# Patient Record
Sex: Male | Born: 1955 | Race: Black or African American | Hispanic: No | Marital: Single | Smoking: Current every day smoker
Health system: Southern US, Community
[De-identification: ages and names within clinical notes are randomized; demographics above are authoritative.]

## PROBLEM LIST (undated history)

## (undated) DIAGNOSIS — G709 Myoneural disorder, unspecified: Secondary | ICD-10-CM

## (undated) DIAGNOSIS — K219 Gastro-esophageal reflux disease without esophagitis: Secondary | ICD-10-CM

## (undated) DIAGNOSIS — E119 Type 2 diabetes mellitus without complications: Secondary | ICD-10-CM

## (undated) DIAGNOSIS — T7840XA Allergy, unspecified, initial encounter: Secondary | ICD-10-CM

## (undated) DIAGNOSIS — J189 Pneumonia, unspecified organism: Secondary | ICD-10-CM

## (undated) DIAGNOSIS — H409 Unspecified glaucoma: Secondary | ICD-10-CM

## (undated) DIAGNOSIS — C801 Malignant (primary) neoplasm, unspecified: Secondary | ICD-10-CM

## (undated) DIAGNOSIS — N393 Stress incontinence (female) (male): Secondary | ICD-10-CM

## (undated) DIAGNOSIS — E785 Hyperlipidemia, unspecified: Secondary | ICD-10-CM

## (undated) DIAGNOSIS — I509 Heart failure, unspecified: Secondary | ICD-10-CM

## (undated) DIAGNOSIS — I10 Essential (primary) hypertension: Secondary | ICD-10-CM

## (undated) DIAGNOSIS — U071 COVID-19: Secondary | ICD-10-CM

## (undated) DIAGNOSIS — F419 Anxiety disorder, unspecified: Secondary | ICD-10-CM

## (undated) DIAGNOSIS — H269 Unspecified cataract: Secondary | ICD-10-CM

## (undated) DIAGNOSIS — D649 Anemia, unspecified: Secondary | ICD-10-CM

## (undated) HISTORY — DX: Allergy, unspecified, initial encounter: T78.40XA

## (undated) HISTORY — DX: Hyperlipidemia, unspecified: E78.5

## (undated) HISTORY — PX: CATARACT EXTRACTION: SUR2

## (undated) HISTORY — PX: COLONOSCOPY: SHX174

## (undated) HISTORY — DX: Unspecified glaucoma: H40.9

## (undated) HISTORY — DX: Anemia, unspecified: D64.9

## (undated) HISTORY — DX: Unspecified cataract: H26.9

## (undated) HISTORY — DX: Pneumonia, unspecified organism: J18.9

## (undated) HISTORY — DX: Malignant (primary) neoplasm, unspecified: C80.1

## (undated) HISTORY — DX: Myoneural disorder, unspecified: G70.9

## (undated) HISTORY — DX: Type 2 diabetes mellitus without complications: E11.9

## (undated) HISTORY — DX: Essential (primary) hypertension: I10

## (undated) HISTORY — PX: PROSTATE SURGERY: SHX751

## (undated) HISTORY — PX: PROSTATE BIOPSY: SHX241

---

## 2003-05-17 HISTORY — PX: KNEE ARTHROSCOPY: SHX127

## 2003-07-18 ENCOUNTER — Ambulatory Visit (HOSPITAL_COMMUNITY): Admission: RE | Admit: 2003-07-18 | Discharge: 2003-07-18 | Payer: Self-pay | Admitting: Orthopedic Surgery

## 2004-04-06 ENCOUNTER — Ambulatory Visit: Payer: Self-pay | Admitting: Gastroenterology

## 2004-04-16 ENCOUNTER — Ambulatory Visit: Payer: Self-pay | Admitting: Gastroenterology

## 2009-03-21 ENCOUNTER — Emergency Department (HOSPITAL_COMMUNITY): Admission: EM | Admit: 2009-03-21 | Discharge: 2009-03-21 | Payer: Self-pay | Admitting: Emergency Medicine

## 2010-03-16 HISTORY — PX: ROBOT ASSISTED LAPAROSCOPIC RADICAL PROSTATECTOMY: SHX5141

## 2010-05-11 ENCOUNTER — Emergency Department (HOSPITAL_COMMUNITY)
Admission: EM | Admit: 2010-05-11 | Discharge: 2010-05-12 | Payer: Self-pay | Source: Home / Self Care | Admitting: Emergency Medicine

## 2010-08-18 LAB — CBC
HCT: 42.1 % (ref 39.0–52.0)
MCHC: 34.9 g/dL (ref 30.0–36.0)
MCV: 97.6 fL (ref 78.0–100.0)
Platelets: 279 10*3/uL (ref 150–400)
RDW: 12.7 % (ref 11.5–15.5)
WBC: 7.4 10*3/uL (ref 4.0–10.5)

## 2010-08-18 LAB — DIFFERENTIAL
Basophils Absolute: 0 10*3/uL (ref 0.0–0.1)
Basophils Relative: 1 % (ref 0–1)
Eosinophils Absolute: 0.2 10*3/uL (ref 0.0–0.7)
Eosinophils Relative: 2 % (ref 0–5)
Neutrophils Relative %: 69 % (ref 43–77)

## 2010-08-18 LAB — BASIC METABOLIC PANEL
BUN: 11 mg/dL (ref 6–23)
CO2: 27 mEq/L (ref 19–32)
Chloride: 104 mEq/L (ref 96–112)
Creatinine, Ser: 1.11 mg/dL (ref 0.4–1.5)
Glucose, Bld: 101 mg/dL — ABNORMAL HIGH (ref 70–99)

## 2010-08-18 LAB — POCT CARDIAC MARKERS
CKMB, poc: 1.9 ng/mL (ref 1.0–8.0)
Troponin i, poc: <0.05 ng/mL (ref 0.00–0.09)

## 2010-08-18 LAB — D-DIMER, QUANTITATIVE: D-Dimer, Quant: 0.26 ug/mL-FEU (ref 0.00–0.48)

## 2011-02-14 ENCOUNTER — Emergency Department (HOSPITAL_COMMUNITY)
Admission: EM | Admit: 2011-02-14 | Discharge: 2011-02-14 | Disposition: A | Discharge status: Home / Self Care | Payer: BC Managed Care – PPO | Attending: Emergency Medicine | Admitting: Emergency Medicine

## 2011-02-14 ENCOUNTER — Emergency Department (HOSPITAL_COMMUNITY): Payer: BC Managed Care – PPO

## 2011-02-14 DIAGNOSIS — I10 Essential (primary) hypertension: Secondary | ICD-10-CM | POA: Insufficient documentation

## 2011-02-14 DIAGNOSIS — R0602 Shortness of breath: Secondary | ICD-10-CM | POA: Insufficient documentation

## 2011-02-14 DIAGNOSIS — J40 Bronchitis, not specified as acute or chronic: Secondary | ICD-10-CM | POA: Insufficient documentation

## 2011-02-14 DIAGNOSIS — R05 Cough: Secondary | ICD-10-CM | POA: Insufficient documentation

## 2011-02-14 DIAGNOSIS — R062 Wheezing: Secondary | ICD-10-CM | POA: Insufficient documentation

## 2011-02-14 DIAGNOSIS — F17200 Nicotine dependence, unspecified, uncomplicated: Secondary | ICD-10-CM | POA: Insufficient documentation

## 2011-02-14 DIAGNOSIS — R059 Cough, unspecified: Secondary | ICD-10-CM | POA: Insufficient documentation

## 2011-05-03 ENCOUNTER — Encounter: Payer: Self-pay | Admitting: Gastroenterology

## 2011-05-04 ENCOUNTER — Encounter: Payer: Self-pay | Admitting: Gastroenterology

## 2011-05-26 ENCOUNTER — Encounter: Payer: Self-pay | Admitting: Gastroenterology

## 2011-06-07 ENCOUNTER — Encounter: Payer: Self-pay | Admitting: Gastroenterology

## 2011-06-07 ENCOUNTER — Ambulatory Visit (AMBULATORY_SURGERY_CENTER): Payer: BC Managed Care – PPO | Admitting: *Deleted

## 2011-06-07 VITALS — Ht 69.0 in | Wt 209.0 lb

## 2011-06-07 DIAGNOSIS — Z1211 Encounter for screening for malignant neoplasm of colon: Secondary | ICD-10-CM

## 2011-06-07 MED ORDER — PEG-KCL-NACL-NASULF-NA ASC-C 100 G PO SOLR: ORAL | Status: DC

## 2011-06-20 ENCOUNTER — Encounter: Payer: Self-pay | Admitting: Gastroenterology

## 2011-06-20 ENCOUNTER — Ambulatory Visit (AMBULATORY_SURGERY_CENTER): Payer: BC Managed Care – PPO | Admitting: Gastroenterology

## 2011-06-20 VITALS — BP 136/76 | HR 75 | Temp 97.5°F | Resp 17 | Ht 69.0 in | Wt 209.0 lb

## 2011-06-20 DIAGNOSIS — Z800 Family history of malignant neoplasm of digestive organs: Secondary | ICD-10-CM

## 2011-06-20 DIAGNOSIS — Z1211 Encounter for screening for malignant neoplasm of colon: Secondary | ICD-10-CM

## 2011-06-20 DIAGNOSIS — D126 Benign neoplasm of colon, unspecified: Secondary | ICD-10-CM

## 2011-06-20 MED ORDER — SODIUM CHLORIDE 0.9 % IV SOLN: 500.0000 mL | INTRAVENOUS | Status: DC

## 2011-06-20 NOTE — Progress Notes (Signed)
Patient did not experience any of the following events: a burn prior to discharge; a fall within the facility; wrong site/side/patient/procedure/implant event; or a hospital transfer or hospital admission upon discharge from the facility. (G8907) Patient did not have preoperative order for IV antibiotic SSI prophylaxis. (G8918)  

## 2011-06-20 NOTE — Op Note (Signed)
[facility] [address] [place], [place]  [address]  COLONOSCOPY PROCEDURE REPORT  PATIENT:  Joseph, Harvey  MR#:  [id number] BIRTHDATE:  May 15, 1956, 55 yrs. old  GENDER:  male ENDOSCOPIST:  Barbette Hair. Arlyce Dice, MD REF. BY: PROCEDURE DATE:  06/20/2011 PROCEDURE:  Colonoscopy with snare polypectomy, Colon with cold biopsy polypectomy ASA CLASS:  Class II INDICATIONS:  Elevated Risk Screening, family history of colon cancer Parent MEDICATIONS:   MAC sedation, administered by CRNA propofol 430mg IV  DESCRIPTION OF PROCEDURE:   After the risks benefits and alternatives of the procedure were thoroughly explained, informed consent was obtained.  Digital rectal exam was performed and revealed no abnormalities.   The [id number] endoscope was introduced through the anus and advanced to the cecum, which was identified by both the appendix and ileocecal valve, without limitations.  The quality of the prep was excellent, using MoviPrep.  The instrument was then slowly withdrawn as the colon was fully examined. <<PROCEDUREIMAGES>>  FINDINGS:  There were multiple polyps identified and removed. in the descending colon (see image5 and image6). 2 1-38mm flat polyps - removed with cold bx forceps, single 3mm polyp removed with cold polypectomy snare - submitted to pathology  A diminutive polyp was found in the sigmoid colon. It was 2 mm in size. It was found 16 cm from the point of entry. The polyp was removed using cold biopsy forceps.  This was otherwise a normal examination of the colon (see image3 and image7).   Retroflexed views in the rectum revealed no abnormalities.    The time to cecum =  1) 2.0 minutes. The scope was then withdrawn in  1) 15.75  minutes from the cecum and the procedure completed. COMPLICATIONS:  None ENDOSCOPIC IMPRESSION: 1) Polyps, multiple in the descending colon 2) 2 mm diminutive polyp in the sigmoid colon 3) Otherwise normal  examination RECOMMENDATIONS: 1) Given your significant family history of colon cancer, you should have a repeat colonoscopy in 5 years REPEAT EXAM:  In 5 year(s) for Colonoscopy.  ______________________________ Barbette Hair. Arlyce Dice, MD  CC:  Ginger Carne MD  n. Rosalie DoctorBarbette Hair. Devan Danzer at 06/20/2011 10:13 AM  [name], [phone]

## 2011-06-20 NOTE — Patient Instructions (Signed)
FOLLOW DISCHARGE INSTRUCTIONS (BLUE AND GREEN SHEETS).. 

## 2011-06-21 ENCOUNTER — Telehealth: Payer: Self-pay

## 2011-06-21 ENCOUNTER — Telehealth: Payer: Self-pay | Admitting: *Deleted

## 2011-06-21 NOTE — Telephone Encounter (Signed)
Pt was already called back by another recovery room nurse. Pt states that he is doing well.

## 2011-06-21 NOTE — Telephone Encounter (Signed)
  Follow up Call-  Call back number 06/20/2011  Post procedure Call Back phone  # [phone]  Permission to leave phone message Yes     Patient questions:  Do you have a fever, pain , or abdominal swelling? no Pain Score  0 *  Have you tolerated food without any problems? yes  Have you been able to return to your normal activities? yes  Do you have any questions about your discharge instructions: Diet   no Medications  no Follow up visit  no  Do you have questions or concerns about your Care? no  Actions: * If pain score is 4 or above: No action needed, pain <4.  The pt asked if he could come and get a work note for yesterday.  I advised him yes to come to the third floor desk off the elevator. Maw

## 2011-06-24 ENCOUNTER — Encounter: Payer: Self-pay | Admitting: Gastroenterology

## 2012-05-30 ENCOUNTER — Ambulatory Visit (INDEPENDENT_AMBULATORY_CARE_PROVIDER_SITE_OTHER)
Admission: RE | Admit: 2012-05-30 | Discharge: 2012-05-30 | Disposition: A | Discharge status: Home / Self Care | Payer: BC Managed Care – PPO | Source: Ambulatory Visit | Attending: Internal Medicine | Admitting: Internal Medicine

## 2012-05-30 ENCOUNTER — Ambulatory Visit (INDEPENDENT_AMBULATORY_CARE_PROVIDER_SITE_OTHER): Payer: BC Managed Care – PPO | Admitting: Internal Medicine

## 2012-05-30 ENCOUNTER — Encounter: Payer: Self-pay | Admitting: Internal Medicine

## 2012-05-30 ENCOUNTER — Encounter: Payer: Self-pay | Admitting: *Deleted

## 2012-05-30 VITALS — BP 134/86 | HR 82 | Temp 98.1°F | Ht 69.0 in | Wt 224.0 lb

## 2012-05-30 DIAGNOSIS — J45909 Unspecified asthma, uncomplicated: Secondary | ICD-10-CM

## 2012-05-30 DIAGNOSIS — J4550 Severe persistent asthma, uncomplicated: Secondary | ICD-10-CM | POA: Insufficient documentation

## 2012-05-30 DIAGNOSIS — I10 Essential (primary) hypertension: Secondary | ICD-10-CM

## 2012-05-30 MED ORDER — OLMESARTAN MEDOXOMIL 20 MG PO TABS: 20.0000 mg | ORAL_TABLET | Freq: Every day | ORAL | Status: DC

## 2012-05-30 NOTE — Progress Notes (Signed)
  Subjective:    Patient ID: Joseph Harvey, male    DOB: [birth year] MRN: [id number]  HPI  26 yobm smoker prev eval by [facility] [place] around 2010 for "allergies" = chronic cough and sob Pos dust, mold rec avoidance and took shots x 6months no help so stopped and on multiple inhalers > not helping so referred himself to pulmonary clinic 05/30/2012 for eval of sob.   05/30/2012 1st pulmonary eval on ACEI on multiple inhalers cc sob in spells x 3-4 years, nothing really helps x steroid shots, happens a week at a time and happened last around Christmas 2013 staying on singulair and dulera not really better with proaire/neb.  Assoc with non-productive cough and sense of throat congestion and pnds   Sleeping ok without nocturnal  or early am exacerbation  of respiratory  c/o's or need for noct saba. Also denies any obvious fluctuation of symptoms with weather or environmental changes or other aggravating or alleviating factors except as outlined above   Review of Systems  Constitutional: Negative for fever, chills, activity change, appetite change and unexpected weight change.  HENT: Positive for congestion and sore throat. Negative for rhinorrhea, sneezing, trouble swallowing, dental problem, voice change and postnasal drip.   Eyes: Negative for visual disturbance.  Respiratory: Positive for cough and shortness of breath. Negative for choking.   Cardiovascular: Negative for chest pain and leg swelling.  Gastrointestinal: Negative for nausea, vomiting and abdominal pain.  Genitourinary: Negative for difficulty urinating.  Musculoskeletal: Negative for arthralgias.  Skin: Negative for rash.  Psychiatric/Behavioral: Negative for behavioral problems and confusion.       Objective:   Physical Exam Pleasant amb wm nad prominent pseudowheeze Wt Readings from Last 3 Encounters:  05/30/12 224 lb (101.606 kg)  06/20/11 209 lb (94.802 kg)  06/07/11 209 lb (94.802 kg)    HEENT: nl dentition, turbinates,  and orophanx. Nl external ear canals without cough reflex   NECK :  without JVD/Nodes/TM/ nl carotid upstrokes bilaterally   LUNGS: no acc muscle use, clear to A and P bilaterally without cough on insp or exp maneuvers   CV:  RRR  no s3 or murmur or increase in P2, no edema   ABD:  soft and nontender with nl excursion in the supine position. No bruits or organomegaly, bowel sounds nl  MS:  warm without deformities, calf tenderness, cyanosis or clubbing  SKIN: warm and dry without lesions    NEURO:  alert, approp, no deficits    CXR  05/30/2012 :  Bronchitic changes.         Assessment & Plan:

## 2012-05-30 NOTE — Patient Instructions (Addendum)
Stop lisinopril Start benicar 20 mg one daily  Continue dulera and singulair for now Only use your albuterol as a rescue medication to be used if you can't catch your breath by resting or doing a relaxed purse lip breathing pattern. The less you use it, the better it will work when you need it.   Please remember to go to  x-ray department downstairs for your tests - we will call you with the results when they are available.     Please schedule a follow up office visit in 4 weeks, sooner if needed with pfts

## 2012-05-31 NOTE — Progress Notes (Signed)
Quick Note:  Spoke with pt and notified of results per Dr. Wert. Pt verbalized understanding and denied any questions.  ______ 

## 2012-06-01 DIAGNOSIS — I10 Essential (primary) hypertension: Secondary | ICD-10-CM | POA: Insufficient documentation

## 2012-06-01 NOTE — Assessment & Plan Note (Signed)

## 2012-06-01 NOTE — Assessment & Plan Note (Addendum)
Not clear this is asthma at all >  Classic Upper airway cough syndrome, so named because it's frequently impossible to sort out how much is  CR/sinusitis with freq throat clearing (which can be related to primary GERD)   vs  causing  secondary (" extra esophageal")  GERD from wide swings in gastric pressure that occur with throat clearing, often  promoting self use of mint and menthol lozenges that reduce the lower esophageal sphincter tone and exacerbate the problem further in a cyclical fashion.   These are the same pts (now being labeled as having "irritable larynx syndrome" by some cough centers) who not infrequently have a history of having failed to tolerate ace inhibitors,  dry powder inhalers or biphosphonates or report having atypical reflux symptoms that don't respond to standard doses of PPI , and are easily confused as having aecopd or asthma flares by even experienced allergists/ pulmonologists.   Will try off acei and regroup with pft's and modify his asthma and allergy medication once no longer fanning the fire of upper airway inflammation with ACEI related bradykinins

## 2012-06-29 ENCOUNTER — Ambulatory Visit: Payer: BC Managed Care – PPO | Admitting: Internal Medicine

## 2012-06-30 ENCOUNTER — Other Ambulatory Visit: Payer: Self-pay

## 2012-07-02 ENCOUNTER — Telehealth: Payer: Self-pay | Admitting: Internal Medicine

## 2012-07-02 MED ORDER — OLMESARTAN MEDOXOMIL 20 MG PO TABS: 20.0000 mg | ORAL_TABLET | Freq: Every day | ORAL | Status: DC

## 2012-07-02 NOTE — Telephone Encounter (Signed)
Refill sent. Pt is aware. Brailey Buescher, CMA  

## 2012-07-25 ENCOUNTER — Encounter: Payer: Self-pay | Admitting: *Deleted

## 2012-07-25 ENCOUNTER — Ambulatory Visit (INDEPENDENT_AMBULATORY_CARE_PROVIDER_SITE_OTHER): Payer: BC Managed Care – PPO | Admitting: Internal Medicine

## 2012-07-25 ENCOUNTER — Encounter: Payer: Self-pay | Admitting: Allergy

## 2012-07-25 ENCOUNTER — Encounter: Payer: Self-pay | Admitting: Internal Medicine

## 2012-07-25 ENCOUNTER — Ambulatory Visit (INDEPENDENT_AMBULATORY_CARE_PROVIDER_SITE_OTHER)
Admission: RE | Admit: 2012-07-25 | Discharge: 2012-07-25 | Disposition: A | Discharge status: Home / Self Care | Payer: BC Managed Care – PPO | Source: Ambulatory Visit | Attending: Internal Medicine | Admitting: Internal Medicine

## 2012-07-25 VITALS — BP 130/72 | HR 94 | Temp 98.3°F | Ht 68.0 in | Wt 224.0 lb

## 2012-07-25 DIAGNOSIS — J45909 Unspecified asthma, uncomplicated: Secondary | ICD-10-CM

## 2012-07-25 DIAGNOSIS — R059 Cough, unspecified: Secondary | ICD-10-CM

## 2012-07-25 DIAGNOSIS — R05 Cough: Secondary | ICD-10-CM

## 2012-07-25 LAB — PULMONARY FUNCTION TEST

## 2012-07-25 MED ORDER — MOMETASONE FURO-FORMOTEROL FUM 100-5 MCG/ACT IN AERO: INHALATION_SPRAY | RESPIRATORY_TRACT | Status: DC

## 2012-07-25 NOTE — Progress Notes (Signed)
  Subjective:    Patient ID: Joseph Harvey, male    DOB: 12-12-1955 MRN: [id number]  HPI  42 yobm smoker prev eval by [facility] [place] around 2010 for "allergies" = chronic cough and sob Pos dust, mold rec avoidance and took shots x 6months no help so stopped and on multiple inhalers > not helping so referred himself to pulmonary clinic 05/30/2012 for eval of sob.   05/30/2012 1st pulmonary eval on ACEI on multiple inhalers cc sob in spells x 3-4 years, nothing really helps x steroid shots, happens a week at a time and happened last around Christmas 2013 staying on singulair and dulera not really better with proaire/neb.  Assoc with non-productive cough and sense of throat congestion and pnds  rec Stop lisinopril Start benicar 20 mg one daily  Continue dulera and singulair for now   07/25/2012 f/u ov/Wert cc pnds worse when lie > white mucus no sob, using neb once per month  And better p dulera 200  No obvious daytime variabilty or assoc chronic cough or cp or chest tightness, subjective wheeze overt sinus or hb symptoms. No unusual exp hx or h/o childhood pna/ asthma or premature birth to his knowledge.    Sleeping ok with occ  early am exacerbation  of respiratory  c/o's or need for noct saba. Also denies any obvious fluctuation of symptoms with weather or environmental changes or other aggravating or alleviating factors except as outlined above         Objective:   Physical Exam Pleasant amb wm with freq throat clear 07/25/2012  Wt  224  Wt Readings from Last 3 Encounters:  05/30/12 224 lb (101.606 kg)  06/20/11 209 lb (94.802 kg)  06/07/11 209 lb (94.802 kg)    HEENT: nl dentition, turbinates, and orophanx. Nl external ear canals without cough reflex   NECK :  without JVD/Nodes/TM/ nl carotid upstrokes bilaterally   LUNGS: no acc muscle use, clear to A and P bilaterally without cough on insp or exp maneuvers   CV:  RRR  no s3 or murmur or increase in P2, no edema   ABD:  soft  and nontender with nl excursion in the supine position. No bruits or organomegaly, bowel sounds nl  MS:  warm without deformities, calf tenderness, cyanosis or clubbing  SKIN: warm and dry without lesions        CXR  05/30/2012 :  Bronchitic changes.         Assessment & Plan:

## 2012-07-25 NOTE — Patient Instructions (Addendum)
Please see patient coordinator before you leave today  to schedule sinus ct   Decrease to dulera 100 Take 2 puffs first thing in am and then another 2 puffs about 12 hours later.   Work on inhaler technique:  relax and gently blow all the way out then take a nice smooth deep breath back in, triggering the inhaler at same time you start breathing in.  Hold for up to 5 seconds if you can.  Rinse and gargle with water when done   If your mouth or throat starts to bother you,   I suggest you time the inhaler to your dental care and after using the inhaler(s) brush teeth and tongue with a baking soda containing toothpaste and when you rinse this out, gargle with it first to see if this helps your mouth and throat.     mucinex dm 1200 mg every 12 hours as needed for cough  Pepcid ac 20 mg one at bedtime   The key is to stop smoking completely before smoking completely stops you!   Please schedule a follow up office visit in 4 weeks, sooner if needed

## 2012-07-25 NOTE — Progress Notes (Signed)
PFT done today. 

## 2012-07-27 NOTE — Progress Notes (Signed)
Quick Note:  Spoke with pt and notified of results per Dr. Wert. Pt verbalized understanding and denied any questions.  ______ 

## 2012-07-28 NOTE — Assessment & Plan Note (Signed)
-   Sinus CT  07/25/2012  > No evidence for acute or chronic sinus disease.  Strongly suspect  Classic Upper airway cough syndrome, so named because it's frequently impossible to sort out how much is  CR/sinusitis with freq throat clearing (which can be related to primary GERD)   vs  causing  secondary (" extra esophageal")  GERD from wide swings in gastric pressure that occur with throat clearing, often  promoting self use of mint and menthol lozenges that reduce the lower esophageal sphincter tone and exacerbate the problem further in a cyclical fashion.   These are the same pts (now being labeled as having "irritable larynx syndrome" by some cough centers) who not infrequently have a history of having failed to tolerate ace inhibitors,  dry powder inhalers or biphosphonates or report having atypical reflux symptoms that don't respond to standard doses of PPI , and are easily confused as having aecopd or asthma flares by even experienced allergists/ pulmonologists.   In absence of active sinus dz most likely this is either gerd or a primary irritable larynx syndrome but may be exac by ics or assoc with cough variant asthma - tough to know what to do so will use the lower strenght  of dulera and treat  Gerd/ cyclical cough aggressively  See instructions for specific recommendations which were reviewed directly with the patient who was given a copy with highlighter outlining the key components.

## 2012-07-28 NOTE — Assessment & Plan Note (Addendum)
-   stop acei 05/30/2012  - PFT's 07/25/2012  FEV1 2.08 (63%) 77 ratio and no change p B2 ,  DLCO 97  All goals of chronic asthma control met including optimal function and elimination of symptoms with minimal need for rescue therapy.  Contingencies discussed in full including contacting this office immediately if not controlling the symptoms using the rule of two's.       Each maintenance medication was reviewed in detail including most importantly the difference between maintenance and as needed and under what circumstances the prns are to be used.  Please see instructions for details which were reviewed in writing and the patient given a copy.    The proper method of use, as well as anticipated side effects, of a metered-dose inhaler are discussed and demonstrated to the patient. Improved effectiveness after extensive coaching during this visit to a level of approximately  75%

## 2012-08-10 ENCOUNTER — Encounter: Payer: Self-pay | Admitting: Internal Medicine

## 2012-08-29 ENCOUNTER — Ambulatory Visit (INDEPENDENT_AMBULATORY_CARE_PROVIDER_SITE_OTHER): Payer: BC Managed Care – PPO | Admitting: Internal Medicine

## 2012-08-29 ENCOUNTER — Encounter: Payer: Self-pay | Admitting: Internal Medicine

## 2012-08-29 ENCOUNTER — Encounter: Payer: Self-pay | Admitting: *Deleted

## 2012-08-29 VITALS — BP 120/72 | HR 80 | Temp 98.4°F | Ht 69.0 in | Wt 212.0 lb

## 2012-08-29 DIAGNOSIS — J45909 Unspecified asthma, uncomplicated: Secondary | ICD-10-CM

## 2012-08-29 DIAGNOSIS — F17200 Nicotine dependence, unspecified, uncomplicated: Secondary | ICD-10-CM

## 2012-08-29 DIAGNOSIS — I10 Essential (primary) hypertension: Secondary | ICD-10-CM

## 2012-08-29 MED ORDER — MOMETASONE FURO-FORMOTEROL FUM 100-5 MCG/ACT IN AERO: INHALATION_SPRAY | RESPIRATORY_TRACT | Status: DC

## 2012-08-29 NOTE — Patient Instructions (Addendum)
Decrease to dulera 100 Take 2 puffs first thing in am and then another 2 puffs about 12 hours later.   Work on inhaler technique:  relax and gently blow all the way out then take a nice smooth deep breath back in, triggering the inhaler at same time you start breathing in.  Hold for up to 5 seconds if you can.  Rinse and gargle with water when done   If your mouth or throat starts to bother you,   I suggest you time the inhaler to your dental care and after using the inhaler(s) brush teeth and tongue with a baking soda containing toothpaste and when you rinse this out, gargle with it first to see if this helps your mouth and throat.     mucinex dm 1200 mg every 12 hours as needed for cough  Pepcid ac 20 mg one at bedtime   The key is to stop smoking completely before smoking completely stops you!   Please schedule a follow up office visit in 2 months

## 2012-08-29 NOTE — Progress Notes (Signed)
Subjective:    Patient ID: Joseph Harvey, male    DOB: 1955-07-09 MRN: [id number]  HPI  83 yobm smoker prev eval by [facility] [place] around 2010 for "allergies" = chronic cough and sob Pos dust, mold rec avoidance and took shots x 6months no help so stopped and on multiple inhalers > not helping so referred himself to pulmonary clinic 05/30/2012 for eval of sob.   05/30/2012 1st pulmonary eval on ACEI on multiple inhalers cc sob in spells x 3-4 years, nothing really helps x steroid shots, happens a week at a time and happened last around Christmas 2013 staying on singulair and dulera not really better with proaire/neb.  Assoc with non-productive cough and sense of throat congestion and pnds  rec Stop lisinopril Start benicar 20 mg one daily  Continue dulera and singulair for now   07/25/2012 f/u ov/Joseph Harvey cc pnds worse when lie > white mucus no sob, using neb once per month  And better p dulera 200 rec Please see patient coordinator before you leave today  to schedule sinus ct > ok Decrease to dulera 100 Take 2 puffs first thing in am and then another 2 puffs about 12 hours later.  Work on inhaler technique:  mucinex dm 1200 mg every 12 hours as needed for cough Pepcid ac 20 mg one at bedtime  The key is to stop smoking completely before smoking completely stops you!    08/29/2012 f/u ov/Joseph Harvey re asthma Chief Complaint  Patient presents with  . Follow-up    Pt states has had two asthma flares since last visit- given neb txs and pred taper per PCP. Last flare was approx 2 wks ago. He states breathing back at normal baseline today   for the last week still feels urge to use nebulizer several times a week, esp in am, due to sense of chest congestion and tightness, esp in am better p neb and not using saba hfa first as rec with poor hfa technique on dulera 200 2bid   No obvious daytime variabilty or assoc chronic cough or cp or chest tightness, subjective wheeze overt sinus or hb symptoms. No  unusual exp hx or h/o childhood pna/ asthma or premature birth to his knowledge.    Sleeping ok with occ  early am exacerbation  of respiratory  c/o's or need for noct saba. Also denies any obvious fluctuation of symptoms with weather or environmental changes or other aggravating or alleviating factors except as outlined above         Objective:   Physical Exam Pleasant amb wm with less freq throat clear but mod hoarse 07/25/2012  Wt  224 >  212 08/29/12 Wt Readings from Last 3 Encounters:  05/30/12 224 lb (101.606 kg)  06/20/11 209 lb (94.802 kg)  06/07/11 209 lb (94.802 kg)    HEENT: nl dentition, turbinates, and orophanx. Nl external ear canals without cough reflex   NECK :  without JVD/Nodes/TM/ nl carotid upstrokes bilaterally   LUNGS: no acc muscle use, late exp wheeze bilaterally   CV:  RRR  no s3 or murmur or increase in P2, no edema   ABD:  soft and nontender with nl excursion in the supine position. No bruits or organomegaly, bowel sounds nl  MS:  warm without deformities, calf tenderness, cyanosis or clubbing  SKIN: warm and dry without lesions        CXR  05/30/2012 :  Bronchitic changes.         Assessment & Plan:

## 2012-08-29 NOTE — Assessment & Plan Note (Signed)
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

## 2012-08-29 NOTE — Assessment & Plan Note (Addendum)
-   stop acei 05/30/2012  - PFT's 07/25/2012  FEV1 2.08 (63%) 77 ratio and no change p B2 ,  DLCO 97 - hfa 90% 08/29/2012   The proper method of use, as well as anticipated side effects, of a metered-dose inhaler are discussed and demonstrated to the patient. Improved effectiveness after extensive coaching during this visit to a level of approximately  90%  from baseline < 50 while on the 200 2bid dose of dulera which may have been irritating the upper airway more than helping the lower.  Will therefore try better hfa and less potent ics, namely dulera 100 2bid and see if symptoms not better- if worsen then the asthma component which is probably fueled by cigarettes, is worth that it otherwise appears

## 2012-08-31 DIAGNOSIS — F17200 Nicotine dependence, unspecified, uncomplicated: Secondary | ICD-10-CM | POA: Insufficient documentation

## 2012-08-31 NOTE — Assessment & Plan Note (Signed)
I took an extended  opportunity with this patient to outline the consequences of continued cigarette use  in airway disorders based on all the data we have from the multiple national lung health studies (perfomed over decades at millions of dollars in cost)  indicating that smoking cessation, not choice of inhalers or physicians, is the most important aspect of care.   

## 2012-10-29 ENCOUNTER — Ambulatory Visit (INDEPENDENT_AMBULATORY_CARE_PROVIDER_SITE_OTHER): Payer: BC Managed Care – PPO | Admitting: Internal Medicine

## 2012-10-29 ENCOUNTER — Encounter: Payer: Self-pay | Admitting: Internal Medicine

## 2012-10-29 ENCOUNTER — Encounter: Payer: Self-pay | Admitting: *Deleted

## 2012-10-29 VITALS — BP 130/92 | HR 78 | Temp 98.2°F | Ht 69.0 in | Wt 216.0 lb

## 2012-10-29 DIAGNOSIS — F17200 Nicotine dependence, unspecified, uncomplicated: Secondary | ICD-10-CM

## 2012-10-29 DIAGNOSIS — I10 Essential (primary) hypertension: Secondary | ICD-10-CM

## 2012-10-29 DIAGNOSIS — J45909 Unspecified asthma, uncomplicated: Secondary | ICD-10-CM

## 2012-10-29 NOTE — Assessment & Plan Note (Signed)
Strongly encouraged to quit completely.

## 2012-10-29 NOTE — Progress Notes (Signed)
Subjective:    Patient ID: Joseph Harvey, male    DOB: [birth year] MRN: [id number]  HPI  33 yobm smoker prev eval by [facility] around 2010 for "allergies" = chronic cough and sob Pos dust, mold rec avoidance and took shots x 6months no help so stopped and on multiple inhalers > not helping so referred himself to pulmonary clinic 05/30/2012 for eval of sob.   05/30/2012 1st pulmonary eval on ACEI on multiple inhalers cc sob in spells x 3-4 years, nothing really helps x steroid shots, happens a week at a time and happened last around Christmas 2013 staying on singulair and dulera not really better with proaire/neb.  Assoc with non-productive cough and sense of throat congestion and pnds  rec Stop lisinopril Start benicar 20 mg one daily  Continue dulera and singulair for now   07/25/2012 f/u ov/Jossalyn Forgione cc pnds worse when lie > white mucus no sob, using neb once per month  And better p dulera 200 rec Please see patient coordinator before you leave today  to schedule sinus ct > ok Decrease to dulera 100 Take 2 puffs first thing in am and then another 2 puffs about 12 hours later.  Work on inhaler technique:  mucinex dm 1200 mg every 12 hours as needed for cough Pepcid ac 20 mg one at bedtime  The key is to stop smoking completely before smoking completely stops you!    08/29/2012 f/u ov/Cyprian Gongaware re asthma Chief Complaint  Patient presents with  . Follow-up    Pt states has had two asthma flares since last visit- given neb txs and pred taper per PCP. Last flare was approx 2 wks ago. He states breathing back at normal baseline today   for the last week still feels urge to use nebulizer several times a week, esp in am, due to sense of chest congestion and tightness, esp in am better p neb and not using saba hfa first as rec with poor hfa technique on dulera 200 2bid rec Decrease to dulera 100 Take 2 puffs first thing in am and then another 2 puffs about 12 hours later.  Work on inhaler technique:    mucinex dm 1200 mg every 12 hours as needed for cough Pepcid ac 20 mg one at bedtime  The key is to stop smoking completely before smoking completely stops you!    10/29/2012 f/u ov/Hadleigh Felber re asthma on dulera  100 2 bid/ still smoking some Chief Complaint  Patient presents with  . Asthma    Breathing has improved since changing BP med. Reports coughing from time to time. Denies chest pain,  chest tightness or SOB.  avg use of saba once a week, occ neb also.     No obvious daytime variabilty or assoc chronic cough or cp or chest tightness, subjective wheeze overt sinus or hb symptoms. No unusual exp hx or h/o childhood pna/ asthma or premature birth to his knowledge.    Sleeping ok with occ  early am exacerbation  of respiratory  c/o's or need for noct saba. Also denies any obvious fluctuation of symptoms with weather or environmental changes or other aggravating or alleviating factors except as outlined above    Current Medications, Allergies, Past Medical History, Past Surgical History, Family History, and Social History were reviewed in [employer] record.  ROS  The following are not active complaints unless bolded sore throat, dysphagia, dental problems, itching, sneezing,  nasal congestion or excess/ purulent secretions, ear ache,  fever, chills, sweats, unintended wt loss, pleuritic or exertional cp, hemoptysis,  orthopnea pnd or leg swelling, presyncope, palpitations, heartburn, abdominal pain, anorexia, nausea, vomiting, diarrhea  or change in bowel or urinary habits, change in stools or urine, dysuria,hematuria,  rash, arthralgias, visual complaints, headache, numbness weakness or ataxia or problems with walking or coordination,  change in mood/affect or memory.           Objective:   Physical Exam Pleasant amb wm with much  less freq throat and no longer hoarse 07/25/2012  Wt  224 >  212 08/29/12 > 10/29/2012  216  Wt Readings from Last 3 Encounters:   05/30/12 224 lb (101.606 kg)  06/20/11 209 lb (94.802 kg)  06/07/11 209 lb (94.802 kg)    HEENT: nl dentition, turbinates, and orophanx. Nl external ear canals without cough reflex   NECK :  without JVD/Nodes/TM/ nl carotid upstrokes bilaterally   LUNGS: no acc muscle use, late exp wheeze bilaterally   CV:  RRR  no s3 or murmur or increase in P2, no edema   ABD:  soft and nontender with nl excursion in the supine position. No bruits or organomegaly, bowel sounds nl  MS:  warm without deformities, calf tenderness, cyanosis or clubbing  SKIN: warm and dry without lesions        CXR  05/30/2012 :  Bronchitic changes.         Assessment & Plan:

## 2012-10-29 NOTE — Assessment & Plan Note (Signed)
-   stop acei 05/30/2012  - PFT's 07/25/2012  FEV1 2.08 (63%) 77 ratio and no change p B2 ,  DLCO 97 - hfa 90% 08/29/2012  > confirmed 10/29/2012   All goals of chronic asthma control met including optimal function and elimination of symptoms with minimal need for rescue therapy.  Contingencies discussed in full including contacting this office immediately if not controlling the symptoms using the rule of two's.

## 2012-10-29 NOTE — Patient Instructions (Addendum)
Try dymista one twice daily instead of astelin and if you like it we'll call it in  No other changes on your medications  Please schedule a follow up office visit in 6 months, sooner if needed

## 2012-10-29 NOTE — Assessment & Plan Note (Signed)
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

## 2012-10-31 ENCOUNTER — Telehealth: Payer: Self-pay | Admitting: Internal Medicine

## 2012-10-31 MED ORDER — AZELASTINE-FLUTICASONE 137-50 MCG/ACT NA SUSP: 2.0000 | Freq: Every day | NASAL | Status: DC

## 2012-10-31 NOTE — Telephone Encounter (Signed)
Pt aware RX SENT . Nothing further was needed

## 2012-12-10 ENCOUNTER — Telehealth: Payer: Self-pay | Admitting: Internal Medicine

## 2012-12-10 ENCOUNTER — Other Ambulatory Visit (INDEPENDENT_AMBULATORY_CARE_PROVIDER_SITE_OTHER): Payer: BC Managed Care – PPO

## 2012-12-10 ENCOUNTER — Encounter: Payer: Self-pay | Admitting: Internal Medicine

## 2012-12-10 ENCOUNTER — Ambulatory Visit (INDEPENDENT_AMBULATORY_CARE_PROVIDER_SITE_OTHER): Payer: BC Managed Care – PPO | Admitting: Internal Medicine

## 2012-12-10 ENCOUNTER — Encounter: Payer: Self-pay | Admitting: *Deleted

## 2012-12-10 VITALS — BP 124/82 | HR 93 | Temp 98.6°F | Ht 69.0 in | Wt 220.0 lb

## 2012-12-10 DIAGNOSIS — J45909 Unspecified asthma, uncomplicated: Secondary | ICD-10-CM

## 2012-12-10 DIAGNOSIS — F17200 Nicotine dependence, unspecified, uncomplicated: Secondary | ICD-10-CM

## 2012-12-10 LAB — ALLERGY PROFILE REGION II-DC, DE, MD, ~~LOC~~, VA
Allergen, D pternoyssinus,d7: <0.1 kU/L
Alternaria Alternata: <0.1 kU/L
Box Elder IgE: <0.1 kU/L
Cat Dander: <0.1 kU/L
Cockroach: <0.1 kU/L
D. farinae: <0.1 kU/L
Dog Dander: <0.1 kU/L
Oak: <0.1 kU/L
Pecan/Hickory Tree IgE: <0.1 kU/L

## 2012-12-10 LAB — CBC WITH DIFFERENTIAL/PLATELET
Basophils Relative: 0.8 % (ref 0.0–3.0)
Eosinophils Absolute: 0.2 10*3/uL (ref 0.0–0.7)
Eosinophils Relative: 3.8 % (ref 0.0–5.0)
HCT: 44.3 % (ref 39.0–52.0)
Lymphs Abs: 2.6 10*3/uL (ref 0.7–4.0)
MCHC: 34.1 g/dL (ref 30.0–36.0)
MCV: 96.2 fl (ref 78.0–100.0)
Monocytes Absolute: 0.6 10*3/uL (ref 0.1–1.0)
Platelets: 363 10*3/uL (ref 150.0–400.0)
WBC: 6.3 10*3/uL (ref 4.5–10.5)

## 2012-12-10 MED ORDER — FAMOTIDINE 20 MG PO TABS: ORAL_TABLET | ORAL | Status: DC

## 2012-12-10 MED ORDER — DEXLANSOPRAZOLE 60 MG PO CPDR: DELAYED_RELEASE_CAPSULE | ORAL | Status: DC

## 2012-12-10 MED ORDER — PREDNISONE (PAK) 10 MG PO TABS: ORAL_TABLET | ORAL | Status: DC

## 2012-12-10 NOTE — Telephone Encounter (Signed)
Per pt prednisone was suppose to be called in from today appt. I looked at AVS and does not mention this. Please advise MW thanks  Allergies  Allergen Reactions  . Robitussin (Guaifenesin) Shortness Of Breath    wheezing

## 2012-12-10 NOTE — Patient Instructions (Addendum)
Dulera 100 Take 2 puffs first thing in am and then another 2 puffs about 12 hours later.  Dexilant 60 mg Take 30-60 min before first meal of the day and Pepcid 20 mg one at bedtime  For cough > mucinex dm 1200 mg every 12 hours  For breathing (if you can't catch  it) > ventolin / proaire up 2 puffs every 4hours,  If that fails ok to nebulizer every 4 hours but the goal in albuterol is less than twice a week.  The key is to stop smoking completely before smoking completely stops you!   Please remember to go to the lab   department downstairs for your tests - we will call you with the results when they are available.     See Tammy NP w/in 2 weeks with all your medications, even over the counter meds, separated in two separate bags, the ones you take no matter what vs the ones you stop once you feel better and take only as needed when you feel you need them.   Tammy  will generate for you a new user friendly Medication calendar that will put [place] all on the same page re: your medication use.     Without this process, it simply isn't possible to assure that we are providing  your outpatient care  with  the attention to detail we feel you deserve.   If we cannot assure that you're getting that kind of care,  then we cannot manage your problem effectively from this clinic.  Once you have seen Tammy and we are sure that we're all on the same page with your medication use she will arrange follow up with me.

## 2012-12-10 NOTE — Progress Notes (Signed)
Subjective:    Patient ID: Joseph Harvey, male    DOB: 1955-11-27 MRN: [id number]  HPI  21 yobm smoker prev eval by [facility] around 2010 for "allergies" = chronic cough and sob Pos dust, mold rec avoidance and took shots x 6months no help so stopped and on multiple inhalers > not helping so referred himself to pulmonary clinic 05/30/2012 for eval of sob.   05/30/2012 1st pulmonary eval on ACEI on multiple inhalers cc sob in spells x 3-4 years, nothing really helps x steroid shots, happens a week at a time and happened last around Christmas 2013 staying on singulair and dulera not really better with proaire/neb.  Assoc with non-productive cough and sense of throat congestion and pnds  rec Stop lisinopril Start benicar 20 mg one daily  Continue dulera and singulair for now   07/25/2012 f/u ov/Wert cc pnds worse when lie > white mucus no sob, using neb once per month  And better p dulera 200 rec Please see patient coordinator before you leave today  to schedule sinus ct > ok Decrease to dulera 100 Take 2 puffs first thing in am and then another 2 puffs about 12 hours later.  Work on inhaler technique:  mucinex dm 1200 mg every 12 hours as needed for cough Pepcid ac 20 mg one at bedtime  The key is to stop smoking completely before smoking completely stops you!    08/29/2012 f/u ov/Wert re asthma Chief Complaint  Patient presents with  . Follow-up    Pt states has had two asthma flares since last visit- given neb txs and pred taper per PCP. Last flare was approx 2 wks ago. He states breathing back at normal baseline today   for the last week still feels urge to use nebulizer several times a week, esp in am, due to sense of chest congestion and tightness, esp in am better p neb and not using saba hfa first as rec with poor hfa technique on dulera 200 2bid rec Decrease to dulera 100 Take 2 puffs first thing in am and then another 2 puffs about 12 hours later.  Work on inhaler technique:    mucinex dm 1200 mg every 12 hours as needed for cough Pepcid ac 20 mg one at bedtime  The key is to stop smoking completely before smoking completely stops you!    10/29/2012 f/u ov/Wert re asthma on dulera  100 2 bid/ still smoking some avg use of saba once a week, occ neb also.   rec Try dymista one twice daily instead of astelin and if you like it we'll call it in No other changes on your medications  12/10/2012 f/u ov/Wert re ? Asthma flare still smoking Chief Complaint  Patient presents with  . Acute Visit    Pt c/o increased SOB x 3 wks- went to UC on 11/16/12 and was given prednisone taper and then again a wk later was given "two shots". He states has not noticed much improvement since then. He also c/o non prod cough and wheezing.   using lots of saba in various forms day > night with only mild improvement in symptoms  No obvious daytime variabilty or assoc productive or cp or chest tightness, subjective wheeze overt sinus or hb symptoms. No unusual exp hx or h/o childhood pna/ asthma or premature birth to his knowledge.    Sleeping ok with occ  early am exacerbation  of respiratory  c/o's or need for noct saba. Also denies  any obvious fluctuation of symptoms with weather or environmental changes or other aggravating or alleviating factors except as outlined above    Current Medications, Allergies, Past Medical History, Past Surgical History, Family History, and Social History were reviewed in [employer] record.  ROS  The following are not active complaints unless bolded sore throat, dysphagia, dental problems, itching, sneezing,  nasal congestion or excess/ purulent secretions, ear ache,   fever, chills, sweats, unintended wt loss, pleuritic or exertional cp, hemoptysis,  orthopnea pnd or leg swelling, presyncope, palpitations, heartburn, abdominal pain, anorexia, nausea, vomiting, diarrhea  or change in bowel or urinary habits, change in stools or urine,  dysuria,hematuria,  rash, arthralgias, visual complaints, headache, numbness weakness or ataxia or problems with walking or coordination,  change in mood/affect or memory.           Objective:   Physical Exam Pleasant amb wm with much  less freq throat and no longer hoarse  07/25/2012  Wt  224 >  212 08/29/12 > 10/29/2012  216 > 12/10/2012 220    05/30/12 224 lb (101.606 kg)  06/20/11 209 lb (94.802 kg)  06/07/11 209 lb (94.802 kg)    HEENT: nl dentition, turbinates, and orophanx. Nl external ear canals without cough reflex   NECK :  without JVD/Nodes/TM/ nl carotid upstrokes bilaterally   LUNGS: no acc muscle use, late exp wheeze bilaterally   CV:  RRR  no s3 or murmur or increase in P2, no edema   ABD:  soft and nontender with nl excursion in the supine position. No bruits or organomegaly, bowel sounds nl  MS:  warm without deformities, calf tenderness, cyanosis or clubbing  SKIN: warm and dry without lesions        CXR  05/30/2012 :  Bronchitic changes.         Assessment & Plan:

## 2012-12-10 NOTE — Telephone Encounter (Signed)
Pt aware and needed nothing further 

## 2012-12-10 NOTE — Telephone Encounter (Signed)
Sorry didn't get it sent before lunch, in there now

## 2012-12-11 NOTE — Assessment & Plan Note (Signed)
I took an extended  opportunity with this patient to outline the consequences of continued cigarette use  in airway disorders based on all the data we have from the multiple national lung health studies (perfomed over decades at millions of dollars in cost)  indicating that smoking cessation, not choice of inhalers or physicians, is the most important aspect of care.   

## 2012-12-11 NOTE — Assessment & Plan Note (Addendum)
-   stop acei 05/30/2012  - PFT's 07/25/2012  FEV1 2.08 (63%) 77 ratio and no change p B2 ,  DLCO 97 - hfa 90% 08/29/2012  > confirmed 10/29/2012  - allergy profile 12/10/2012 > eos 3.8% with IgE < 5 neg rast  DDX of  difficult airways managment all start with A and  include Adherence, Ace Inhibitors, Acid Reflux, Active Sinus Disease, Alpha 1 Antitripsin deficiency, Anxiety masquerading as Airways dz,  ABPA,  allergy(esp in young), Aspiration (esp in elderly), Adverse effects of DPI,  Active smokers, plus two Bs  = Bronchiectasis and Beta blocker use..and one C= CHF   Adherence is always the initial "prime suspect" and is a multilayered concern that requires a "trust but verify" approach in every patient - starting with knowing how to use medications, especially inhalers, correctly, keeping up with refills and understanding the fundamental difference between maintenance and prns vs those medications only taken for a very short course and then stopped and not refilled.  He is struggling with this concept so next step is:  To keep things simple, I have asked the patient to first separate medicines that are perceived as maintenance, that is to be taken daily "no matter what", from those medicines that are taken on only on an as-needed basis and I have given the patient examples of both, and then return to see our NP to generate a  detailed  medication calendar which should be followed until the next physician sees the patient and updates it.    ? Acid reflux > try max rx   Active smoking > discussed separately

## 2012-12-11 NOTE — Progress Notes (Signed)
Quick Note:  Spoke with pt and notified of results per Dr. Wert. Pt verbalized understanding and denied any questions.  ______ 

## 2012-12-27 ENCOUNTER — Encounter: Payer: Self-pay | Admitting: Adult Health

## 2012-12-27 ENCOUNTER — Telehealth: Payer: Self-pay | Admitting: Adult Health

## 2012-12-27 ENCOUNTER — Ambulatory Visit (INDEPENDENT_AMBULATORY_CARE_PROVIDER_SITE_OTHER): Payer: BC Managed Care – PPO | Admitting: Adult Health

## 2012-12-27 VITALS — BP 132/90 | HR 84 | Temp 97.7°F | Ht 69.0 in | Wt 220.0 lb

## 2012-12-27 DIAGNOSIS — J45909 Unspecified asthma, uncomplicated: Secondary | ICD-10-CM

## 2012-12-27 MED ORDER — AMOXICILLIN-POT CLAVULANATE 875-125 MG PO TABS: 1.0000 | ORAL_TABLET | Freq: Two times a day (BID) | ORAL | Status: AC

## 2012-12-27 MED ORDER — PREDNISONE 10 MG PO TABS: ORAL_TABLET | ORAL | Status: DC

## 2012-12-27 MED ORDER — DEXLANSOPRAZOLE 60 MG PO CPDR: DELAYED_RELEASE_CAPSULE | ORAL | Status: DC

## 2012-12-27 NOTE — Patient Instructions (Addendum)
Augmentin 875mg  Twice daily  For 10 days  Prednisone taper over next week.  Follow med calendar closely and bring to each visit.  follow up Dr. Sherene Sires  In 6-8 weeks and As needed   Please contact office for sooner follow up if symptoms do not improve or worsen or seek emergency care  Most important goal is to quit smoking .

## 2012-12-27 NOTE — Addendum Note (Signed)
Addended by: Boone Master E on: 12/27/2012 05:22 PM   Modules accepted: Orders

## 2012-12-27 NOTE — Assessment & Plan Note (Signed)
Exacerbation  Patient's medications were reviewed today and patient education was given. Computerized medication calendar was adjusted/completed  Plan  Augmentin 875mg  Twice daily  For 10 days  Prednisone taper over next week.  Follow med calendar closely and bring to each visit.  follow up Dr. Sherene Sires  In 6-8 weeks and As needed   Please contact office for sooner follow up if symptoms do not improve or worsen or seek emergency care

## 2012-12-27 NOTE — Progress Notes (Signed)
Subjective:    Patient ID: Joseph Harvey, male    DOB: [birth year] MRN: [id number]  HPI  56 yobm smoker prev eval by [facility] around 2010 for "allergies" = chronic cough and sob Pos dust, mold rec avoidance and took shots x 6months no help so stopped and on multiple inhalers > not helping so referred himself to pulmonary clinic 05/30/2012 for eval of sob.   05/30/2012 1st pulmonary eval on ACEI on multiple inhalers cc sob in spells x 3-4 years, nothing really helps x steroid shots, happens a week at a time and happened last around Christmas 2013 staying on singulair and dulera not really better with proaire/neb.  Assoc with non-productive cough and sense of throat congestion and pnds  rec Stop lisinopril Start benicar 20 mg one daily  Continue dulera and singulair for now   07/25/2012 f/u ov/Wert cc pnds worse when lie > white mucus no sob, using neb once per month  And better p dulera 200 rec Please see patient coordinator before you leave today  to schedule sinus ct > ok Decrease to dulera 100 Take 2 puffs first thing in am and then another 2 puffs about 12 hours later.  Work on inhaler technique:  mucinex dm 1200 mg every 12 hours as needed for cough Pepcid ac 20 mg one at bedtime  The key is to stop smoking completely before smoking completely stops you!    08/29/2012 f/u ov/Wert re asthma Chief Complaint  Patient presents with  . Follow-up    Pt states has had two asthma flares since last visit- given neb txs and pred taper per PCP. Last flare was approx 2 wks ago. He states breathing back at normal baseline today   for the last week still feels urge to use nebulizer several times a week, esp in am, due to sense of chest congestion and tightness, esp in am better p neb and not using saba hfa first as rec with poor hfa technique on dulera 200 2bid rec Decrease to dulera 100 Take 2 puffs first thing in am and then another 2 puffs about 12 hours later.  Work on inhaler technique:    mucinex dm 1200 mg every 12 hours as needed for cough Pepcid ac 20 mg one at bedtime  The key is to stop smoking completely before smoking completely stops you!    10/29/2012 f/u ov/Wert re asthma on dulera  100 2 bid/ still smoking some avg use of saba once a week, occ neb also.   rec Try dymista one twice daily instead of astelin and if you like it we'll call it in No other changes on your medications  12/10/2012 f/u ov/Wert re ? Asthma flare still smoking Chief Complaint  Patient presents with  . Acute Visit    Pt c/o increased SOB x 3 wks- went to UC on 11/16/12 and was given prednisone taper and then again a wk later was given "two shots". He states has not noticed much improvement since then. He also c/o non prod cough and wheezing.   using lots of saba in various forms day > night with only mild improvement in symptoms >>Dulera rx and PPI/Pepcid    12/27/2012 Follow up and med calendar  Patient returns for a follow up office visit and medication review. Reviewed all his medications and organized them into a medication calendar with patient education Patient reports that he continues to have some intermittent wheezing, and cough. Now coughing up thick mucus on and  off. Patient denies any hemoptysis, orthopnea, PND, or increased leg swelling.   new med calendar - pt brought all meds with him today.  no new complaints.    Current Medications, Allergies, Past Medical History, Past Surgical History, Family History, and Social History were reviewed in [employer] record.  ROS  The following are not active complaints unless bolded sore throat, dysphagia, dental problems, itching, sneezing,  nasal congestion or excess/ purulent secretions, ear ache,   fever, chills, sweats, unintended wt loss, pleuritic or exertional cp, hemoptysis,  orthopnea pnd or leg swelling, presyncope, palpitations, heartburn, abdominal pain, anorexia, nausea, vomiting, diarrhea  or change in  bowel or urinary habits, change in stools or urine, dysuria,hematuria,  rash, arthralgias, visual complaints, headache, numbness weakness or ataxia or problems with walking or coordination,  change in mood/affect or memory.           Objective:   Physical Exam Pleasant amb wm with much  less freq throat and no longer hoarse  07/25/2012  Wt  224 >  212 08/29/12 > 10/29/2012  216 > 12/10/2012 220 > 220 12/27/2012   HEENT: nl dentition, turbinates, and orophanx. Nl external ear canals without cough reflex   NECK :  without JVD/Nodes/TM/ nl carotid upstrokes bilaterally   LUNGS: no acc muscle use, exp wheeze bilaterally on forced exp    CV:  RRR  no s3 or murmur or increase in P2, no edema   ABD:  soft and nontender with nl excursion in the supine position. No bruits or organomegaly, bowel sounds nl  MS:  warm without deformities, calf tenderness, cyanosis or clubbing  SKIN: warm and dry without lesions        CXR  05/30/2012 :  Bronchitic changes.         Assessment & Plan:

## 2012-12-28 MED ORDER — PANTOPRAZOLE SODIUM 40 MG PO TBEC: 40.0000 mg | DELAYED_RELEASE_TABLET | Freq: Every day | ORAL | Status: DC

## 2012-12-28 NOTE — Telephone Encounter (Signed)
Fax also received from [facility] Dexilant 60mg  requiring prior authorization Per TP: ok to change to pantoprazole 40mg  once daily 30-60 min before first meal of the day  [facility] for pt informing him that this medication is being changed to a generic Called [facility], spoke with pharmacist Josph Macho and gave verbal order Med list updated; will sign off.

## 2013-01-01 NOTE — Addendum Note (Signed)
Addended by: Boone Master E on: 01/01/2013 12:22 PM   Modules accepted: Orders

## 2013-02-07 ENCOUNTER — Ambulatory Visit (INDEPENDENT_AMBULATORY_CARE_PROVIDER_SITE_OTHER): Payer: BC Managed Care – PPO | Admitting: Internal Medicine

## 2013-02-07 ENCOUNTER — Encounter: Payer: Self-pay | Admitting: Internal Medicine

## 2013-02-07 ENCOUNTER — Encounter: Payer: Self-pay | Admitting: *Deleted

## 2013-02-07 VITALS — BP 118/70 | HR 87 | Temp 98.3°F | Ht 69.0 in | Wt 227.2 lb

## 2013-02-07 DIAGNOSIS — F17200 Nicotine dependence, unspecified, uncomplicated: Secondary | ICD-10-CM

## 2013-02-07 DIAGNOSIS — J45909 Unspecified asthma, uncomplicated: Secondary | ICD-10-CM

## 2013-02-07 MED ORDER — AZITHROMYCIN 250 MG PO TABS: ORAL_TABLET | ORAL | Status: DC

## 2013-02-07 MED ORDER — PREDNISONE (PAK) 10 MG PO TABS: ORAL_TABLET | ORAL | Status: DC

## 2013-02-07 NOTE — Patient Instructions (Addendum)
The key is to stop smoking completely before smoking completely stops you!   If condition worsens > Prednisone 10 mg take  4 each am x 2 days,   2 each am x 2 days,  1 each am x 2 days and stop and Zpak  Dulera 100 Take 2 puffs first thing in am and then another 2 puffs about 12 hours later.   Please schedule a follow up office visit in 4 weeks, sooner if needed  4 weeks samples given

## 2013-02-07 NOTE — Progress Notes (Signed)
Subjective:    Patient ID: Joseph Harvey, male    DOB: Jun 29, 1955   MRN: [id number]   Brief patient profile:   57 yobm smoker prev eval by Joseph Harvey around 2010 for "allergies" = chronic cough and sob Pos dust, mold rec avoidance and took shots x 6months no help so stopped and on multiple inhalers > not helping so referred himself to pulmonary clinic 05/30/2012 for eval of sob.   05/30/2012 1st pulmonary eval on ACEI on multiple inhalers cc sob in spells x 3-4 years, nothing really helps x steroid shots, happens a week at a time and happened last around Christmas 2013 staying on singulair and dulera not really better with proaire/neb.  Assoc with non-productive cough and sense of throat congestion and pnds  rec Stop lisinopril Start benicar 20 mg one daily  Continue dulera and singulair for now   07/25/2012 f/u ov/Joseph Harvey cc pnds worse when lie > white mucus no sob, using neb once per month  And better p dulera 200 rec Please see patient coordinator before you leave today  to schedule sinus ct > ok Decrease to dulera 100 Take 2 puffs first thing in am and then another 2 puffs about 12 hours later.  Work on inhaler technique:  mucinex dm 1200 mg every 12 hours as needed for cough Pepcid ac 20 mg one at bedtime  The key is to stop smoking completely before smoking completely stops you!    08/29/2012 f/u ov/Joseph Harvey re asthma Chief Complaint  Patient presents with  . Follow-up    Pt states has had two asthma flares since last visit- given neb txs and pred taper per PCP. Last flare was approx 2 wks ago. He states breathing back at normal baseline today   for the last week still feels urge to use nebulizer several times a week, esp in am, due to sense of chest congestion and tightness, esp in am better p neb and not using saba hfa first as rec with poor hfa technique on dulera 200 2bid rec Decrease to dulera 100 Take 2 puffs first thing in am and then another 2 puffs about 12 hours later.  Work  on inhaler technique:   mucinex dm 1200 mg every 12 hours as needed for cough Pepcid ac 20 mg one at bedtime  The key is to stop smoking completely before smoking completely stops you!    10/29/2012 f/u ov/Joseph Harvey re asthma on dulera  100 2 bid/ still smoking some avg use of saba once a week, occ neb also.   rec Try dymista one twice daily instead of astelin and if you like it we'll call it in No other changes on your medications  12/10/2012 f/u ov/Joseph Harvey re ? Asthma flare still smoking Chief Complaint  Patient presents with  . Acute Visit    Pt c/o increased SOB x 3 wks- went to UC on 11/16/12 and was given prednisone taper and then again a wk later was given "two shots". He states has not noticed much improvement since then. He also c/o non prod cough and wheezing.   using lots of saba in various forms day > night with only mild improvement in symptoms >>Dulera rx and PPI/Pepcid    12/27/2012 Follow up and med calendar  Patient returns for a follow up office visit and medication review. Reviewed all his medications and organized them into a medication calendar with patient education Patient reports that he continues to have some intermittent wheezing, and cough. Now  coughing up thick mucus on and off. Patient denies any hemoptysis, orthopnea, PND, or increased leg swelling.   new med calendar - pt brought all meds with him today.  no new complaints. rec Augmentin 875mg  Twice daily  For 10 days  Prednisone taper over next week.    02/07/2013 f/u ov/Joseph Harvey ZO:[id number]/ still smoking  Chief Complaint  Patient presents with  . Follow-up    Pt states doing well today and denies any co's today. He states using rescue inhaler approx 3 x per wk. He has been txed by UC x 2 for sinus infection and asthma flare since the last visit.   presently not limited from desired activities by sob  No obvious daytime variabilty or assoc chronic cough or cp or chest tightness, subjective wheeze overt sinus or hb  symptoms. No unusual exp hx or h/o childhood pna/ asthma or knowledge of premature birth.    Current Medications, Allergies, Past Medical History, Past Surgical History, Family History, and Social History were reviewed in [employer] record.  ROS  The following are not active complaints unless bolded sore throat, dysphagia, dental problems, itching, sneezing,  nasal congestion or excess/ purulent secretions, ear ache,   fever, chills, sweats, unintended wt loss, pleuritic or exertional cp, hemoptysis,  orthopnea pnd or leg swelling, presyncope, palpitations, heartburn, abdominal pain, anorexia, nausea, vomiting, diarrhea  or change in bowel or urinary habits, change in stools or urine, dysuria,hematuria,  rash, arthralgias, visual complaints, headache, numbness weakness or ataxia or problems with walking or coordination,  change in mood/affect or memory.           Objective:   Physical Exam  Pleasant amb wm  nad  07/25/2012  Wt  224 >  212 08/29/12 > 10/29/2012  216 > 12/10/2012 220 > 220 12/27/2012 > 02/07/2013  227   HEENT: nl dentition, turbinates, and orophanx. Nl external ear canals without cough reflex   NECK :  without JVD/Nodes/TM/ nl carotid upstrokes bilaterally   LUNGS: no acc muscle use, exp wheeze bilaterally on forced exp    CV:  RRR  no s3 or murmur or increase in P2, no edema   ABD:  soft and nontender with nl excursion in the supine position. No bruits or organomegaly, bowel sounds nl  MS:  warm without deformities, calf tenderness, cyanosis or clubbing  SKIN: warm and dry without lesions        CXR  05/30/2012 :  Bronchitic changes.         Assessment & Plan:

## 2013-02-10 NOTE — Assessment & Plan Note (Signed)

## 2013-02-10 NOTE — Assessment & Plan Note (Signed)
-   stop acei 05/30/2012  - PFT's 07/25/2012  FEV1 2.08 (63%) 77 ratio and no change p B2 ,  DLCO 97 - hfa 90% 08/29/2012  > confirmed 10/29/2012  - allergy profile 12/10/2012 > eos 3.8% with IgE < 5 neg rast -med calendar 12/27/2012   All goals of chronic asthma control met including optimal function and elimination of symptoms with minimal need for rescue therapy.  Contingencies discussed in full including contacting this office immediately if not controlling the symptoms using the rule of two's.

## 2013-02-18 ENCOUNTER — Telehealth: Payer: Self-pay | Admitting: Internal Medicine

## 2013-02-18 NOTE — Telephone Encounter (Signed)
I advised the pt to speak to his PCP. Carron Curie, CMA

## 2013-02-18 NOTE — Telephone Encounter (Signed)
lmomtcb x1 for pt--he needs to call PCP

## 2013-03-08 ENCOUNTER — Encounter: Payer: Self-pay | Admitting: Internal Medicine

## 2013-03-08 ENCOUNTER — Ambulatory Visit (INDEPENDENT_AMBULATORY_CARE_PROVIDER_SITE_OTHER): Payer: BC Managed Care – PPO | Admitting: Internal Medicine

## 2013-03-08 VITALS — BP 110/70 | HR 80 | Temp 98.1°F | Ht 69.0 in | Wt 222.0 lb

## 2013-03-08 DIAGNOSIS — F17200 Nicotine dependence, unspecified, uncomplicated: Secondary | ICD-10-CM

## 2013-03-08 DIAGNOSIS — J45909 Unspecified asthma, uncomplicated: Secondary | ICD-10-CM

## 2013-03-08 MED ORDER — PREDNISONE (PAK) 10 MG PO TABS: ORAL_TABLET | ORAL | Status: DC

## 2013-03-08 MED ORDER — BUDESONIDE-FORMOTEROL FUMARATE 160-4.5 MCG/ACT IN AERO: INHALATION_SPRAY | RESPIRATORY_TRACT | Status: DC

## 2013-03-08 NOTE — Patient Instructions (Addendum)
The key is to stop smoking completely before smoking completely stops you!    Prednisone 10 mg take  4 each am x 2 days,   2 each am x 2 days,  1 each am x 2 days and stop    Plan A = automatic =symbicort 160 Take 2 puffs first thing in am and then another 2 puffs about 12 hours later.   Stop dulera    Plan B  Only use your albuterol(ventolin) as a rescue medication to be used if you can't catch your breath by resting or doing a relaxed purse lip breathing pattern.  - The less you use it, the better it will work when you need it. - Ok to use up to every 4 hours if you must but call for immediate appointment if use goes up over your usual need - Don't leave home without it !!  (think of it like your spare tire for your car)   Plan C Only use if plan B fails = albuterol 2.5 mg every 4 hours but call for appt if using this more than usual  Please schedule a follow up office visit in 6 weeks, call sooner if needed

## 2013-03-08 NOTE — Progress Notes (Signed)
Subjective:    Patient ID: Joseph Harvey, male    DOB: [birth year]   MRN: [id number]   Brief patient profile:   57 yobm smoker prev eval by Joseph Harvey  around 2010 for "allergies" = chronic cough and sob Pos dust, mold rec avoidance and took shots x 6months no help so stopped and on multiple inhalers > not helping so referred himself to pulmonary clinic 05/30/2012 for eval of sob.   05/30/2012 1st pulmonary eval on ACEI on multiple inhalers cc sob in spells x 3-4 years, nothing really helps x steroid shots, happens a week at a time and happened last around Christmas 2013 staying on singulair and dulera not really better with proaire/neb.  Assoc with non-productive cough and sense of throat congestion and pnds  rec Stop lisinopril Start benicar 20 mg one daily  Continue dulera and singulair for now   07/25/2012 f/u ov/Joseph Harvey cc pnds worse when lie > white mucus no sob, using neb once per month  And better p dulera 200 rec Please see patient coordinator before you leave today  to schedule sinus ct > ok Decrease to dulera 100 Take 2 puffs first thing in am and then another 2 puffs about 12 hours later.  Work on inhaler technique:  mucinex dm 1200 mg every 12 hours as needed for cough Pepcid ac 20 mg one at bedtime  The key is to stop smoking completely before smoking completely stops you!    08/29/2012 f/u ov/Joseph Harvey re asthma Chief Complaint  Patient presents with  . Follow-up    Pt states has had two asthma flares since last visit- given neb txs and pred taper per PCP. Last flare was approx 2 wks ago. He states breathing back at normal baseline today   for the last week still feels urge to use nebulizer several times a week, esp in am, due to sense of chest congestion and tightness, esp in am better p neb and not using saba hfa first as rec with poor hfa technique on dulera 200 2bid rec Decrease to dulera 100 Take 2 puffs first thing in am and then another 2 puffs about 12 hours later.  Work  on inhaler technique:   mucinex dm 1200 mg every 12 hours as needed for cough Pepcid ac 20 mg one at bedtime  The key is to stop smoking completely before smoking completely stops you!    10/29/2012 f/u ov/Joseph Harvey re asthma on dulera  100 2 bid/ still smoking some avg use of saba once a week, occ neb also.   rec Try dymista one twice daily instead of astelin and if you like it we'll call it in No other changes on your medications  12/10/2012 f/u ov/Joseph Harvey re ? Asthma flare still smoking Chief Complaint  Patient presents with  . Acute Visit    Pt c/o increased SOB x 3 wks- went to UC on 11/16/12 and was given prednisone taper and then again a wk later was given "two shots". He states has not noticed much improvement since then. He also c/o non prod cough and wheezing.   using lots of saba in various forms day > night with only mild improvement in symptoms >>Dulera rx and PPI/Pepcid    12/27/2012 Follow up and med calendar  Patient returns for a follow up office visit and medication review. Reviewed all his medications and organized them into a medication calendar with patient education Patient reports that he continues to have some intermittent wheezing, and cough. Now  coughing up thick mucus on and off. Patient denies any hemoptysis, orthopnea, PND, or increased leg swelling.   new med calendar - pt brought all meds with him today.  no new complaints. rec Augmentin 875mg  Twice daily  For 10 days  Prednisone taper over next week.    02/07/2013 f/u ov/Joseph Harvey [id number]/ still smoking  Chief Complaint  Patient presents with  . Follow-up    Pt states doing well today and denies any co's today. He states using rescue inhaler approx 3 x per wk. He has been txed by UC x 2 for sinus infection and asthma flare since the last visit.  presently not limited from desired activities by sob rec The key is to stop smoking completely before smoking completely stops you!  Prednisone 10 mg take  4 each am x 2  days,   2 each am x 2 days,  1 each am x 2 days and stop and Zpak Dulera 100 Take 2 puffs first thing in am and then another 2 puffs about 12 hours later.    03/08/2013 f/u ov/Joseph Harvey re: asthma flare Chief Complaint  Patient presents with  . Follow-up    Pt c/o increased SOB just for the past 2 days- having to use rescue inhaler 3 times per wk. He is coughing up more sputum for the past 2 days- clear in color.    did not really understand saba recs, has neb and hfa and confused with meds  plus still smoking.   No obvious daytime variabilty or assoc      overt sinus or hb symptoms. No unusual exp hx or h/o childhood pna/ asthma or knowledge of premature birth.    Current Medications, Allergies, Past Medical History, Past Surgical History, Family History, and Social History were reviewed in [employer] record.  ROS  The following are not active complaints unless bolded sore throat, dysphagia, dental problems, itching, sneezing,  nasal congestion or excess/ purulent secretions, ear ache,   fever, chills, sweats, unintended wt loss, pleuritic or exertional cp, hemoptysis,  orthopnea pnd or leg swelling, presyncope, palpitations, heartburn, abdominal pain, anorexia, nausea, vomiting, diarrhea  or change in bowel or urinary habits, change in stools or urine, dysuria,hematuria,  rash, arthralgias, visual complaints, headache, numbness weakness or ataxia or problems with walking or coordination,  change in mood/affect or memory.           Objective:   Physical Exam  Pleasant amb wm  nad  07/25/2012  Wt  224 >  212 08/29/12 > 10/29/2012  216 > 12/10/2012 220 > 220 12/27/2012 > 02/07/2013  227 > 03/08/2013  222  HEENT: nl dentition, turbinates, and orophanx. Nl external ear canals without cough reflex   NECK :  without JVD/Nodes/TM/ nl carotid upstrokes bilaterally   LUNGS: no acc muscle use, exp wheeze bilaterally on forced exp    CV:  RRR  no s3 or murmur or increase in  P2, no edema   ABD:  soft and nontender with nl excursion in the supine position. No bruits or organomegaly, bowel sounds nl  MS:  warm without deformities, calf tenderness, cyanosis or clubbing  SKIN: warm and dry without lesions        CXR  05/30/2012 :  Bronchitic changes.         Assessment & Plan:

## 2013-03-10 NOTE — Assessment & Plan Note (Signed)

## 2013-03-10 NOTE — Assessment & Plan Note (Addendum)
-   stop acei 05/30/2012  - PFT's 07/25/2012  FEV1 2.08 (63%) 77 ratio and no change p B2 ,  DLCO 97 - hfa 90% 08/29/2012  > confirmed 10/29/2012  - allergy profile 12/10/2012 > eos 3.8% with IgE < 5 neg rast -med calendar 12/27/2012   Adherence is always the initial "prime suspect" and is a multilayered concern that requires a "trust but verify" approach in every patient - starting with knowing how to use medications, especially inhalers, correctly, keeping up with refills and understanding the fundamental difference between maintenance and prns vs those medications only taken for a very short course and then stopped and not refilled. Clearly not understanding how to use his meds correctly.  Active smoking > discussed separately  ? Acid (or non-acid) GERD > always difficult to exclude as up to 75% of pts in some series report no assoc GI/ Heartburn symptoms> rec continue  max (24h)  acid suppression and diet restrictions/ reviewed and instructions given in writting   I had an extended discussion with the patient today lasting 15 to 20 minutes of a 25 minute visit on the following issues:    Each maintenance medication was reviewed in detail including most importantly the difference between maintenance and as needed and under what circumstances the prns are to be used.  Please see instructions for details which were reviewed in writing and the patient given a copy.

## 2013-03-26 ENCOUNTER — Other Ambulatory Visit: Payer: Self-pay | Admitting: Internal Medicine

## 2013-04-19 ENCOUNTER — Ambulatory Visit (INDEPENDENT_AMBULATORY_CARE_PROVIDER_SITE_OTHER): Payer: BC Managed Care – PPO | Admitting: Internal Medicine

## 2013-04-19 ENCOUNTER — Encounter: Payer: Self-pay | Admitting: Internal Medicine

## 2013-04-19 VITALS — BP 140/88 | HR 86 | Temp 98.3°F | Ht 69.0 in | Wt 233.0 lb

## 2013-04-19 DIAGNOSIS — F17200 Nicotine dependence, unspecified, uncomplicated: Secondary | ICD-10-CM

## 2013-04-19 DIAGNOSIS — J45909 Unspecified asthma, uncomplicated: Secondary | ICD-10-CM

## 2013-04-19 NOTE — Progress Notes (Signed)
Subjective:    Patient ID: Joseph Harvey, male    DOB: 07-05-1955   MRN: [id number]   Brief patient profile:   13 yobm smoker prev eval by Gene Rosebud around 2010 for "allergies" = chronic cough and sob Pos dust, mold rec avoidance and took shots x 39months no help so stopped and on multiple inhalers > not helping so referred himself to pulmonary clinic 05/30/2012 for eval of sob with pfts s airflow obst on rx 07/25/12    05/30/2012 1st pulmonary eval on ACEI on multiple inhalers cc sob in spells x 3-4 years, nothing really helps x steroid shots, happens a week at a time and happened last around Christmas 2013 staying on singulair and dulera not really better with proaire/neb.  Assoc with non-productive cough and sense of throat congestion and pnds  rec Stop lisinopril Start benicar 20 mg one daily  Continue dulera and singulair for now   07/25/2012 f/u ov/Ajai Harville cc pnds worse when lie > white mucus no sob, using neb once per month  And better p dulera 200 rec Please see patient coordinator before you leave today  to schedule sinus ct > ok Decrease to dulera 100 Take 2 puffs first thing in am and then another 2 puffs about 12 hours later.  Work on inhaler technique:  mucinex dm 1200 mg every 12 hours as needed for cough Pepcid ac 20 mg one at bedtime  The key is to stop smoking completely before smoking completely stops you!    08/29/2012 f/u ov/Wileen Duncanson re asthma Chief Complaint  Patient presents with  . Follow-up    Pt states has had two asthma flares since last visit- given neb txs and pred taper per PCP. Last flare was approx 2 wks ago. He states breathing back at normal baseline today   for the last week still feels urge to use nebulizer several times a week, esp in am, due to sense of chest congestion and tightness, esp in am better p neb and not using saba hfa first as rec with poor hfa technique on dulera 200 2bid rec Decrease to dulera 100 Take 2 puffs first thing in am and then another  2 puffs about 12 hours later.  Work on inhaler technique:   mucinex dm 1200 mg every 12 hours as needed for cough Pepcid ac 20 mg one at bedtime  The key is to stop smoking completely before smoking completely stops you!    10/29/2012 f/u ov/Zykee Avakian re asthma on dulera  100 2 bid/ still smoking some avg use of saba once a week, occ neb also.   rec Try dymista one twice daily instead of astelin and if you like it we'll call it in No other changes on your medications  12/10/2012 f/u ov/Mattia Osterman re ? Asthma flare still smoking Chief Complaint  Patient presents with  . Acute Visit    Pt c/o increased SOB x 3 wks- went to UC on 11/16/12 and was given prednisone taper and then again a wk later was given "two shots". He states has not noticed much improvement since then. He also c/o non prod cough and wheezing.   using lots of saba in various forms day > night with only mild improvement in symptoms >>Dulera rx and PPI/Pepcid    12/27/2012 Follow up and med calendar  Patient returns for a follow up office visit and medication review. Reviewed all his medications and organized them into a medication calendar with patient education Patient reports that he  continues to have some intermittent wheezing, and cough. Now coughing up thick mucus on and off. Patient denies any hemoptysis, orthopnea, PND, or increased leg swelling.   new med calendar - pt brought all meds with him today.  no new complaints. rec Augmentin 875mg  Twice daily  For 10 days  Prednisone taper over next week.    02/07/2013 f/u ov/Kristapher [place] [id number]/ still smoking  Chief Complaint  Patient presents with  . Follow-up    Pt states doing well today and denies any co's today. He states using rescue inhaler approx 3 x per wk. He has been txed by UC x 2 for sinus infection and asthma flare since the last visit.  presently not limited from desired activities by sob rec The key is to stop smoking completely before smoking completely stops you!   Prednisone 10 mg take  4 each am x 2 days,   2 each am x 2 days,  1 each am x 2 days and stop and Zpak Dulera 100 Take 2 puffs first thing in am and then another 2 puffs about 12 hours later.    03/08/2013 f/u ov/Onis Markoff re: asthma flare Chief Complaint  Patient presents with  . Follow-up    Pt c/o increased SOB just for the past 2 days- having to use rescue inhaler 3 times per wk. He is coughing up more sputum for the past 2 days- clear in color.    did not really understand saba recs, has neb and hfa and confused with meds  plus still smoking rec The key is to stop smoking completely before smoking completely stops you!    Prednisone 10 mg take  4 each am x 2 days,   2 each am x 2 days,  1 each am x 2 days and stop    Plan A = automatic =symbicort 160 Take 2 puffs first thing in am and then another 2 puffs about 12 hours later.  Stop dulera  Plan B  Only use your albuterol(ventolin)   Plan C Only use if plan B fails = albuterol 2.5 mg every 4 hours but call for appt if using this more than usual   04/19/2013 f/u ov/Kirke Breach re: asthma/ still smoking  Chief Complaint  Patient presents with  . Follow-up    Breathing improved since being on symbicort  feels need for saba avg qod, neb rarely  Min white mucus mostly in ams now, 2 weeks prior to [employer] > avelox resolved Not sure singulair is helping Not limited from desired activities by sob   No obvious daytime variabilty or assoc  overt sinus or hb symptoms. No unusual exp hx or h/o childhood pna/ asthma or knowledge of premature birth.    Current Medications, Allergies, Past Medical History, Past Surgical History, Family History, and Social History were reviewed in [employer] record.  ROS  The following are not active complaints unless bolded sore throat, dysphagia, dental problems, itching, sneezing,  nasal congestion or excess/ purulent secretions, ear ache,   fever, chills, sweats, unintended wt loss,  pleuritic or exertional cp, hemoptysis,  orthopnea pnd or leg swelling, presyncope, palpitations, heartburn, abdominal pain, anorexia, nausea, vomiting, diarrhea  or change in bowel or urinary habits, change in stools or urine, dysuria,hematuria,  rash, arthralgias, visual complaints, headache, numbness weakness or ataxia or problems with walking or coordination,  change in mood/affect or memory.           Objective:   Physical Exam  Pleasant amb bm   nad  07/25/2012  Wt  224 >  212 08/29/12 > 10/29/2012  216 > 12/10/2012 220 > 220 12/27/2012 > 02/07/2013  227 > 03/08/2013  222 > 04/19/2013  233   HEENT: nl dentition, turbinates, and orophanx. Nl external ear canals without cough reflex   NECK :  without JVD/Nodes/TM/ nl carotid upstrokes bilaterally   LUNGS: no acc muscle use, distant bs, trace end exp wheeze bilaterally    CV:  RRR  no s3 or murmur or increase in P2, no edema   ABD:  soft and nontender with nl excursion in the supine position. No bruits or organomegaly, bowel sounds nl  MS:  warm without deformities, calf tenderness, cyanosis or clubbing  SKIN: warm and dry without lesions        CXR  05/30/2012 :  Bronchitic changes.         Assessment & Plan:

## 2013-04-19 NOTE — Patient Instructions (Addendum)
Try off singulair to see what difference if any it makes over the next few weeks and if not doing as well and need more rescue medications then resume

## 2013-04-21 NOTE — Assessment & Plan Note (Signed)
Discussed again importance of committing to quit

## 2013-04-21 NOTE — Assessment & Plan Note (Addendum)
-   stop acei 05/30/2012  - PFT's 07/25/2012  FEV1 2.08 (63%) 77 ratio and no change p B2 ,  DLCO 97 - hfa 90% 08/29/2012  > confirmed 10/29/2012  - allergy profile 12/10/2012 > eos 3.8% with IgE < 5 neg rast      Adequate control on present rx, reviewed > probably doesn't need singulair at this point > advised try off and needs to work harder on smoking cessation

## 2013-04-24 ENCOUNTER — Other Ambulatory Visit: Payer: Self-pay | Admitting: Internal Medicine

## 2013-07-18 ENCOUNTER — Ambulatory Visit (INDEPENDENT_AMBULATORY_CARE_PROVIDER_SITE_OTHER): Payer: BC Managed Care – PPO | Admitting: Internal Medicine

## 2013-07-18 ENCOUNTER — Encounter: Payer: Self-pay | Admitting: Internal Medicine

## 2013-07-18 VITALS — BP 142/96 | HR 92 | Temp 98.1°F | Ht 69.0 in | Wt 232.0 lb

## 2013-07-18 DIAGNOSIS — J45909 Unspecified asthma, uncomplicated: Secondary | ICD-10-CM

## 2013-07-18 DIAGNOSIS — F17200 Nicotine dependence, unspecified, uncomplicated: Secondary | ICD-10-CM

## 2013-07-18 DIAGNOSIS — I10 Essential (primary) hypertension: Secondary | ICD-10-CM

## 2013-07-18 MED ORDER — BISOPROLOL FUMARATE 5 MG PO TABS: 5.0000 mg | ORAL_TABLET | Freq: Every day | ORAL | Status: DC

## 2013-07-18 NOTE — Patient Instructions (Addendum)
Ok to change back to zyrtec in place xyzal  The key is to stop smoking completely before smoking completely stops you!   Stop bystolic  Start bisoprolol 5 mg twice daily   You need to see Tammy NP within 2 weeks of making the change to bisoprolol with all meds and your calendar in hand  Please schedule a follow up visit in 3 months but call sooner if needed

## 2013-07-18 NOTE — Assessment & Plan Note (Addendum)
-   stop acei 05/30/2012  - PFT's 07/25/2012  FEV1 2.08 (63%) 77 ratio and no change p B2 ,  DLCO 97 - hfa 90% 08/29/2012  > confirmed 10/29/2012  - allergy profile 12/10/2012 > eos 3.8% with IgE < 5 neg rast - try off singulair 04/29/13  -med calendar 12/27/2012   Still over using saba so not Adequate control on present rx, reviewed  DDX of  difficult airways managment all start with A and  include Adherence, Ace Inhibitors, Acid Reflux, Active Sinus Disease, Alpha 1 Antitripsin deficiency, Anxiety masquerading as Airways dz,  ABPA,  allergy(esp in young), Aspiration (esp in elderly), Adverse effects of DPI,  Active smokers, plus two Bs  = Bronchiectasis and Beta blocker use..and one C= CHF  Adherence is always the initial "prime suspect" and is a multilayered concern that requires a "trust but verify" approach in every patient - starting with knowing how to use medications, especially inhalers, correctly, keeping up with refills and understanding the fundamental difference between maintenance and prns vs those medications only taken for a very short course and then stopped and not refilled.   Active smoking is greatest concern, discussed under separate a/p  Allergy/ rhinitis > rx singulair and zyrtec otc or xyzal  Anxiety usually dx of exclusion but higher on the list in his case.     Each maintenance medication was reviewed in detail including most importantly the difference between maintenance and as needed and under what circumstances the prns are to be used.  Please see instructions for details which were reviewed in writing and the patient given a copy.

## 2013-07-18 NOTE — Assessment & Plan Note (Signed)
Trial off acei started 05/30/12 due to pseudoasthma> resolved  Requesting generic for bystolic > rx bisoprolol   See instructions for specific recommendations which were reviewed directly with the patient who was given a copy with highlighter outlining the key components.

## 2013-07-18 NOTE — Assessment & Plan Note (Signed)

## 2013-07-18 NOTE — Progress Notes (Signed)
Subjective:    Patient ID: Joseph Harvey, male    DOB: 24-Aug-1955   MRN: [id number]   Brief patient profile:   40 yobm smoker prev eval by [facility] around 2010 for "allergies" = chronic cough and sob Pos dust, mold rec avoidance and took shots x 15months no help so stopped and on multiple inhalers > not helping so referred himself to pulmonary clinic 05/30/2012 for eval of sob with pfts s airflow obst on rx 07/25/12    05/30/2012 1st pulmonary eval on ACEI on multiple inhalers cc sob in spells x 3-4 years, nothing really helps x steroid shots, happens a week at a time and happened last around Christmas 2013 staying on singulair and dulera not really better with proaire/neb.  Assoc with non-productive cough and sense of throat congestion and pnds  rec Stop lisinopril Start benicar 20 mg one daily  Continue dulera and singulair for now   07/25/2012 f/u ov/Joseph Harvey cc pnds worse when lie > white mucus no sob, using neb once per month  And better p dulera 200 rec Please see patient coordinator before you leave today  to schedule sinus ct > ok Decrease to dulera 100 Take 2 puffs first thing in am and then another 2 puffs about 12 hours later.  Work on inhaler technique:  mucinex dm 1200 mg every 12 hours as needed for cough Pepcid ac 20 mg one at bedtime  The key is to stop smoking completely before smoking completely stops you!    08/29/2012 f/u ov/Joseph Harvey re asthma Chief Complaint  Patient presents with  . Follow-up    Pt states has had two asthma flares since last visit- given neb txs and pred taper per PCP. Last flare was approx 2 wks ago. He states breathing back at normal baseline today   for the last week still feels urge to use nebulizer several times a week, esp in am, due to sense of chest congestion and tightness, esp in am better p neb and not using saba hfa first as rec with poor hfa technique on dulera 200 2bid rec Decrease to dulera 100 Take 2 puffs first thing in am and then another  2 puffs about 12 hours later.  Work on inhaler technique:   mucinex dm 1200 mg every 12 hours as needed for cough Pepcid ac 20 mg one at bedtime  The key is to stop smoking completely before smoking completely stops you!    10/29/2012 f/u ov/Joseph Harvey re asthma on dulera  100 2 bid/ still smoking some avg use of saba once a week, occ neb also.   rec Try dymista one twice daily instead of astelin and if you like it we'll call it in No other changes on your medications  12/10/2012 f/u ov/Joseph Harvey re ? Asthma flare still smoking Chief Complaint  Patient presents with  . Acute Visit    Pt c/o increased SOB x 3 wks- went to UC on 11/16/12 and was given prednisone taper and then again a wk later was given "two shots". He states has not noticed much improvement since then. He also c/o non prod cough and wheezing.   using lots of saba in various forms day > night with only mild improvement in symptoms >>Dulera rx and PPI/Pepcid    12/27/2012 Follow up and med calendar  Patient returns for a follow up office visit and medication review. Reviewed all his medications and organized them into a medication calendar with patient education Patient reports that he  continues to have some intermittent wheezing, and cough. Now coughing up thick mucus on and off. Patient denies any hemoptysis, orthopnea, PND, or increased leg swelling.   new med calendar - pt brought all meds with him today.  no new complaints. rec Augmentin 875mg  Twice daily  For 10 days  Prednisone taper over next week.    02/07/2013 f/u ov/Joseph Harvey [id number] still smoking  Chief Complaint  Patient presents with  . Follow-up    Pt states doing well today and denies any co's today. He states using rescue inhaler approx 3 x per wk. He has been txed by UC x 2 for sinus infection and asthma flare since the last visit.  presently not limited from desired activities by sob rec The key is to stop smoking completely before smoking completely stops you!   Prednisone 10 mg take  4 each am x 2 days,   2 each am x 2 days,  1 each am x 2 days and stop and Zpak Dulera 100 Take 2 puffs first thing in am and then another 2 puffs about 12 hours later.    03/08/2013 f/u ov/Joseph Harvey re: asthma flare Chief Complaint  Patient presents with  . Follow-up    Pt c/o increased SOB just for the past 2 days- having to use rescue inhaler 3 times per wk. He is coughing up more sputum for the past 2 days- clear in color.    did not really understand saba recs, has neb and hfa and confused with meds  plus still smoking rec The key is to stop smoking completely before smoking completely stops you!  Prednisone 10 mg take  4 each am x 2 days,   2 each am x 2 days,  1 each am x 2 days and stop   Plan A = automatic =symbicort 160 Take 2 puffs first thing in am and then another 2 puffs about 12 hours later.  Stop dulera  Plan B  Only use your albuterol(ventolin)   Plan C Only use if plan B fails = albuterol 2.5 mg every 4 hours but call for appt if using this more than usual   04/19/2013 f/u ov/Joseph Harvey re: asthma/ still smoking  Chief Complaint  Patient presents with  . Follow-up    Breathing improved since being on symbicort  feels need for saba avg qod, neb rarely  Min white mucus mostly in ams now, 2 weeks prior to [employer] > avelox resolved Not sure singulair is helping Not limited from desired activities by sob rec Try off singulair to see what difference if any it makes over the next few weeks and if not doing as well and need more rescue medications then resume   07/18/2013 f/u ov/Joseph Harvey re: asthma/ still smoking / no med calendar  Chief Complaint  Patient presents with  . Follow-up    Pt states that his breathing is overall doing well.  He does c/o PND for the past 3 months.     Added xyzal at bedtime (was on Zyrtec)  Still using saba avg hfa twice daily - no purulent sputum  No obvious daytime variabilty or assoc  overt sinus or hb symptoms. No unusual  exp hx or h/o childhood pna/ asthma or knowledge of premature birth.    Current Medications, Allergies, Past Medical History, Past Surgical History, Family History, and Social History were reviewed in [employer] [facility] record.  ROS  The following are not active complaints unless bolded sore throat,  dysphagia, dental problems, itching, sneezing,  nasal congestion or excess/ purulent secretions, ear ache,   fever, chills, sweats, unintended wt loss, pleuritic or exertional cp, hemoptysis,  orthopnea pnd or leg swelling, presyncope, palpitations, heartburn, abdominal pain, anorexia, nausea, vomiting, diarrhea  or change in bowel or urinary habits, change in stools or urine, dysuria,hematuria,  rash, arthralgias, visual complaints, headache, numbness weakness or ataxia or problems with walking or coordination,  change in mood/affect or memory.           Objective:   Physical Exam  Pleasant amb bm   nad  07/25/2012  Wt  224 >  212 08/29/12 > 10/29/2012  216 > 12/10/2012 220 > 220 12/27/2012 > 02/07/2013  227 > 03/08/2013  222 > 04/19/2013  233 > 232 07/18/2013   HEENT: nl dentition, turbinates, and orophanx. Nl external ear canals without cough reflex   NECK :  without JVD/Nodes/TM/ nl carotid upstrokes bilaterally   LUNGS: no acc muscle use, clear with purse lip    CV:  RRR  no s3 or murmur or increase in P2, no edema   ABD:  soft and nontender with nl excursion in the supine position. No bruits or organomegaly, bowel sounds nl  MS:  warm without deformities, calf tenderness, cyanosis or clubbing  SKIN: warm and dry without lesions        CXR  05/30/2012 :  Bronchitic changes.         Assessment & Plan:

## 2013-07-19 ENCOUNTER — Telehealth: Payer: Self-pay | Admitting: Internal Medicine

## 2013-07-19 NOTE — Telephone Encounter (Signed)
lmomtcb x1 

## 2013-07-22 NOTE — Telephone Encounter (Signed)
Once daily should be fine

## 2013-07-22 NOTE — Telephone Encounter (Signed)
Pt returned call

## 2013-07-22 NOTE — Telephone Encounter (Signed)
Per last ov :  Stop bystolic  Start bisoprolol 5 mg twice daily   Pt states that Bisoprolol was called into pharmacy as 5 mg once daily.  Please clarify correct dose.

## 2013-07-22 NOTE — Telephone Encounter (Signed)
i called and made pt aware. Nothing further needed 

## 2013-08-06 ENCOUNTER — Encounter: Payer: Self-pay | Admitting: Adult Health

## 2013-08-06 ENCOUNTER — Ambulatory Visit (INDEPENDENT_AMBULATORY_CARE_PROVIDER_SITE_OTHER): Payer: BC Managed Care – PPO | Admitting: Adult Health

## 2013-08-06 VITALS — BP 122/84 | HR 84 | Temp 98.0°F | Ht 69.0 in | Wt 213.4 lb

## 2013-08-06 DIAGNOSIS — J45909 Unspecified asthma, uncomplicated: Secondary | ICD-10-CM

## 2013-08-06 MED ORDER — BISOPROLOL FUMARATE 5 MG PO TABS: 5.0000 mg | ORAL_TABLET | Freq: Two times a day (BID) | ORAL | Status: DC

## 2013-08-06 MED ORDER — BISOPROLOL FUMARATE 10 MG PO TABS: 10.0000 mg | ORAL_TABLET | Freq: Every day | ORAL | Status: DC

## 2013-08-06 NOTE — Assessment & Plan Note (Signed)
Not at goal  Will increase Bisoprolol 5mg  Twice daily   follow up with PCP for b/p Low salt diet dsicussed  Smoking cessation

## 2013-08-06 NOTE — Patient Instructions (Addendum)
Increase Bisoprolol 5mg  Twice daily   Restart Symbicort 160 2 puffs Twice daily  , rinse after inhaler use.  MUST STOP SMOKING   Follow up with your family doctor for blood pressure  Follow up Dr. Melvyn Novas  In 6 weeks and .As needed   Bring all your meds to next visit.  Please contact office for sooner follow up if symptoms do not improve or worsen or seek emergency care

## 2013-08-06 NOTE — Assessment & Plan Note (Addendum)
Improved control however stopped symbicort  Advised on smoking cessation  Plan  Restart Symbicort 160 2 puffs Twice daily  , rinse after inhaler use.  MUST STOP SMOKING   Follow up Dr. Melvyn Novas  In 6 weeks and .As needed   Bring all your meds to next visit.  Please contact office for sooner follow up if symptoms do not improve or worsen or seek emergency care

## 2013-08-06 NOTE — Progress Notes (Signed)
Subjective:    Patient ID: Joseph Harvey, male    DOB: 07-05-1955   MRN: [id number]   Brief patient profile:   13 yobm smoker prev eval by [facility] around 2010 for "allergies" = chronic cough and sob Pos dust, mold rec avoidance and took shots x 39months no help so stopped and on multiple inhalers > not helping so referred himself to pulmonary clinic 05/30/2012 for eval of sob with pfts s airflow obst on rx 07/25/12    05/30/2012 1st pulmonary eval on ACEI on multiple inhalers cc sob in spells x 3-4 years, nothing really helps x steroid shots, happens a week at a time and happened last around Christmas 2013 staying on singulair and dulera not really better with proaire/neb.  Assoc with non-productive cough and sense of throat congestion and pnds  rec Stop lisinopril Start benicar 20 mg one daily  Continue dulera and singulair for now   07/25/2012 f/u ov/Wert cc pnds worse when lie > white mucus no sob, using neb once per month  And better p dulera 200 rec Please see patient coordinator before you leave today  to schedule sinus ct > ok Decrease to dulera 100 Take 2 puffs first thing in am and then another 2 puffs about 12 hours later.  Work on inhaler technique:  mucinex dm 1200 mg every 12 hours as needed for cough Pepcid ac 20 mg one at bedtime  The key is to stop smoking completely before smoking completely stops you!    08/29/2012 f/u ov/Wert re asthma Chief Complaint  Patient presents with  . Follow-up    Pt states has had two asthma flares since last visit- given neb txs and pred taper per PCP. Last flare was approx 2 wks ago. He states breathing back at normal baseline today   for the last week still feels urge to use nebulizer several times a week, esp in am, due to sense of chest congestion and tightness, esp in am better p neb and not using saba hfa first as rec with poor hfa technique on dulera 200 2bid rec Decrease to dulera 100 Take 2 puffs first thing in am and then another  2 puffs about 12 hours later.  Work on inhaler technique:   mucinex dm 1200 mg every 12 hours as needed for cough Pepcid ac 20 mg one at bedtime  The key is to stop smoking completely before smoking completely stops you!    10/29/2012 f/u ov/Wert re asthma on dulera  100 2 bid/ still smoking some avg use of saba once a week, occ neb also.   rec Try dymista one twice daily instead of astelin and if you like it we'll call it in No other changes on your medications  12/10/2012 f/u ov/Wert re ? Asthma flare still smoking Chief Complaint  Patient presents with  . Acute Visit    Pt c/o increased SOB x 3 wks- went to UC on 11/16/12 and was given prednisone taper and then again a wk later was given "two shots". He states has not noticed much improvement since then. He also c/o non prod cough and wheezing.   using lots of saba in various forms day > night with only mild improvement in symptoms >>Dulera rx and PPI/Pepcid    12/27/2012 Follow up and med calendar  Patient returns for a follow up office visit and medication review. Reviewed all his medications and organized them into a medication calendar with patient education Patient reports that he  continues to have some intermittent wheezing, and cough. Now coughing up thick mucus on and off. Patient denies any hemoptysis, orthopnea, PND, or increased leg swelling.   new med calendar - pt brought all meds with him today.  no new complaints. rec Augmentin 875mg  Twice daily  For 10 days  Prednisone taper over next week.    02/07/2013 f/u ov/Wert [id number]/ still smoking  Chief Complaint  Patient presents with  . Follow-up    Pt states doing well today and denies any co's today. He states using rescue inhaler approx 3 x per wk. He has been txed by UC x 2 for sinus infection and asthma flare since the last visit.  presently not limited from desired activities by sob rec The key is to stop smoking completely before smoking completely stops you!   Prednisone 10 mg take  4 each am x 2 days,   2 each am x 2 days,  1 each am x 2 days and stop and Zpak Dulera 100 Take 2 puffs first thing in am and then another 2 puffs about 12 hours later.    03/08/2013 f/u ov/Wert re: asthma flare Chief Complaint  Patient presents with  . Follow-up    Pt c/o increased SOB just for the past 2 days- having to use rescue inhaler 3 times per wk. He is coughing up more sputum for the past 2 days- clear in color.    did not really understand saba recs, has neb and hfa and confused with meds  plus still smoking rec The key is to stop smoking completely before smoking completely stops you!  Prednisone 10 mg take  4 each am x 2 days,   2 each am x 2 days,  1 each am x 2 days and stop   Plan A = automatic =symbicort 160 Take 2 puffs first thing in am and then another 2 puffs about 12 hours later.  Stop dulera  Plan B  Only use your albuterol(ventolin)   Plan C Only use if plan B fails = albuterol 2.5 mg every 4 hours but call for appt if using this more than usual   04/19/2013 f/u ov/Wert re: asthma/ still smoking  Chief Complaint  Patient presents with  . Follow-up    Breathing improved since being on symbicort  feels need for saba avg qod, neb rarely  Min white mucus mostly in ams now, 2 weeks prior to [employer] > avelox resolved Not sure singulair is helping Not limited from desired activities by sob rec Try off singulair to see what difference if any it makes over the next few weeks and if not doing as well and need more rescue medications then resume   07/18/2013 f/u ov/Wert re: asthma/ still smoking / no med calendar  Chief Complaint  Patient presents with  . Follow-up    Pt states that his breathing is overall doing well.  He does c/o PND for the past 3 months.     Added xyzal at bedtime (was on Zyrtec)  Still using saba avg hfa twice daily - no purulent sputum >>changed bystolic to bisoprolol and xyzal to zyrtec   08/06/2013 Follow up  MW  pt here for est med calendar - pt did not bring any meds with him today.  reports breathing is improved since last ov. Doing breathing exercises that he saw on You Tube. Unfortunately he stopped his symbicort. Has not used albuterol inhaler. Says he is feeling good with no  flare of cough or wheezing.  He was changed from bystolic to bisoprolo 5mg  daily  . Says b/p is good in am but high in evening.  Still has some nasal drip  No chest pain, orthopnea , fever , edema or hemoptysis.  Still smoking discussed smoking cessation.    Current Medications, Allergies, Past Medical History, Past Surgical History, Family History, and Social History were reviewed in [employer] record.  ROS  The following are not active complaints unless bolded sore throat, dysphagia, dental problems, itching, sneezing,  nasal congestion or excess/ purulent secretions, ear ache,   fever, chills, sweats, unintended wt loss, pleuritic or exertional cp, hemoptysis,  orthopnea pnd or leg swelling, presyncope, palpitations, heartburn, abdominal pain, anorexia, nausea, vomiting, diarrhea  or change in bowel or urinary habits, change in stools or urine, dysuria,hematuria,  rash, arthralgias, visual complaints, headache, numbness weakness or ataxia or problems with walking or coordination,  change in mood/affect or memory.           Objective:   Physical Exam  Pleasant amb bm   nad  07/25/2012  Wt  224 >  212 08/29/12 > 10/29/2012  216 > 12/10/2012 220 > 220 12/27/2012 > 02/07/2013  227 > 03/08/2013  222 > 04/19/2013  233 > 232 07/18/2013 >213 08/06/2013   HEENT: nl dentition, turbinates, and orophanx. Nl external ear canals without cough reflex   NECK :  without JVD/Nodes/TM/ nl carotid upstrokes bilaterally   LUNGS: no acc muscle use, clear with purse lip    CV:  RRR  no s3 or murmur or increase in P2, no edema   ABD:  soft and nontender with nl excursion in the supine position. No bruits or  organomegaly, bowel sounds nl  MS:  warm without deformities, calf tenderness, cyanosis or clubbing  SKIN: warm and dry without lesions        CXR  05/30/2012 : Bronchitic changes.         Assessment & Plan:

## 2013-09-19 ENCOUNTER — Ambulatory Visit: Payer: BC Managed Care – PPO | Admitting: Internal Medicine

## 2013-09-20 ENCOUNTER — Ambulatory Visit (INDEPENDENT_AMBULATORY_CARE_PROVIDER_SITE_OTHER): Payer: BC Managed Care – PPO | Admitting: Internal Medicine

## 2013-09-20 ENCOUNTER — Encounter: Payer: Self-pay | Admitting: Internal Medicine

## 2013-09-20 VITALS — BP 126/64 | HR 97 | Temp 97.7°F | Ht 69.0 in | Wt 228.6 lb

## 2013-09-20 DIAGNOSIS — F17200 Nicotine dependence, unspecified, uncomplicated: Secondary | ICD-10-CM

## 2013-09-20 DIAGNOSIS — I10 Essential (primary) hypertension: Secondary | ICD-10-CM

## 2013-09-20 DIAGNOSIS — J45909 Unspecified asthma, uncomplicated: Secondary | ICD-10-CM

## 2013-09-20 NOTE — Progress Notes (Signed)
Subjective:    Patient ID: Joseph Harvey, male    DOB: April 05, 1956   MRN: [id number]  Brief patient profile:  53 yobm smoker prev eval by [facility] around 2010 for "allergies" = chronic cough and sob Pos dust, mold rec avoidance and took shots x 11months no help so stopped and on multiple inhalers > not helping so referred himself to pulmonary clinic 05/30/2012 for eval of sob with pfts s airflow obst on rx 07/25/12   History of Present Illness  05/30/2012 1st pulmonary eval on ACEI on multiple inhalers cc sob in spells x 3-4 years, nothing really helps x steroid shots, happens a week at a time and happened last around Christmas 2013 staying on singulair and dulera not really better with proaire/neb.  Assoc with non-productive cough and sense of throat congestion and pnds  rec Stop lisinopril Start benicar 20 mg one daily  Continue dulera and singulair for now  12/10/2012 f/u ov/Jalayia Bagheri re ? Asthma flare still smoking Chief Complaint  Patient presents with  . Acute Visit    Pt c/o increased SOB x 3 wks- went to UC on 11/16/12 and was given prednisone taper and then again a wk later was given "two shots". He states has not noticed much improvement since then. He also c/o non prod cough and wheezing.   using lots of saba in various forms day > night with only mild improvement in symptoms >>Dulera rx and PPI/Pepcid    07/18/2013 f/u ov/Veria Stradley re: asthma/ still smoking / no med calendar  Chief Complaint  Patient presents with  . Follow-up    Pt states that his breathing is overall doing well.  He does c/o PND for the past 3 months.     Added xyzal at bedtime (was on Zyrtec)  Still using saba avg hfa twice daily - no purulent sputum >>changed bystolic to bisoprolol and xyzal to zyrtec   08/06/2013 Follow up  MW pt here for est med calendar - pt did not bring any meds with him today.  reports breathing is improved since last ov. Doing breathing exercises that he saw on You Tube. Unfortunately he stopped  his symbicort. Has not used albuterol inhaler. Says he is feeling good with no flare of cough or wheezing.  He was changed from bystolic to bisoprolo 5mg  daily  . Says b/p is good in am but high in evening.  Still has some nasal drip  rec Increase Bisoprolol 5mg  Twice daily   Restart Symbicort 160 2 puffs Twice daily  , rinse after inhaler use.  MUST STOP SMOKING   Follow up with your family doctor for blood pressure  Follow up Dr. Melvyn Novas  In 6 weeks and .As needed   Bring all your meds to next visit.    09/20/2013 f/u ov/Rolla Kedzierski re: chronic asthma / still actively smoking / no meds or med calendar  Chief Complaint  Patient presents with  . Follow-up    Breathing is good. no complaints today  Only using symbicort 160 2bid prn usually before exercise and says never needs saba at all   No obvious day to day or daytime variabilty or assoc chronic cough or cp or chest tightness, subjective wheeze overt sinus or hb symptoms. No unusual exp hx or h/o childhood pna/ asthma or knowledge of premature birth.  Sleeping ok without nocturnal  or early am exacerbation  of respiratory  c/o's or need for noct saba. Also denies any obvious fluctuation of symptoms with weather or  environmental changes or other aggravating or alleviating factors except as outlined above   Current Medications, Allergies, Complete Past Medical History, Past Surgical History, Family History, and Social History were reviewed in [employer] record.  ROS  The following are not active complaints unless bolded sore throat, dysphagia, dental problems, itching, sneezing,  nasal congestion or excess/ purulent secretions, ear ache,   fever, chills, sweats, unintended wt loss, pleuritic or exertional cp, hemoptysis,  orthopnea pnd or leg swelling, presyncope, palpitations, heartburn, abdominal pain, anorexia, nausea, vomiting, diarrhea  or change in bowel or urinary habits, change in stools or urine, dysuria,hematuria,   rash, arthralgias, visual complaints, headache, numbness weakness or ataxia or problems with walking or coordination,  change in mood/affect or memory.                Objective:   Physical Exam  Pleasant amb bm   nad  07/25/2012  Wt  224 >  212 08/29/12 > 10/29/2012  216 > 12/10/2012 220 > 220 12/27/2012 > 02/07/2013  227 > 03/08/2013  222 > 04/19/2013  233 > 232 07/18/2013 >213 08/06/2013 >  229 09/20/2013   HEENT: nl dentition, turbinates, and orophanx. Nl external ear canals without cough reflex   NECK :  without JVD/Nodes/TM/ nl carotid upstrokes bilaterally   LUNGS: no acc muscle use, clear with purse lip    CV:  RRR  no s3 or murmur or increase in P2, no edema   ABD:  soft and nontender with nl excursion in the supine position. No bruits or organomegaly, bowel sounds nl  MS:  warm without deformities, calf tenderness, cyanosis or clubbing  SKIN: warm and dry without lesions        CXR  05/30/2012 : Bronchitic changes.         Assessment & Plan:   Outpatient Encounter Prescriptions as of 09/20/2013  Medication Sig  . albuterol (PROVENTIL) (2.5 MG/3ML) 0.083% nebulizer solution Take 1 vial by mouth every 4 (four) hours as needed for wheezing or shortness of breath (((PLAN B))).   Marland Kitchen albuterol (VENTOLIN HFA) 108 (90 BASE) MCG/ACT inhaler Inhale 2 puffs into the lungs every 4 (four) hours as needed for wheezing or shortness of breath (((PLAN A))).   Marland Kitchen amLODipine (NORVASC) 10 MG tablet Take 10 mg by mouth every morning.  . bisoprolol (ZEBETA) 5 MG tablet Take 1 tablet (5 mg total) by mouth 2 (two) times daily.  . budesonide-formoterol (SYMBICORT) 160-4.5 MCG/ACT inhaler Take 2 puffs first thing in am and then another 2 puffs about 12 hours later.  . cetirizine (ZYRTEC) 10 MG tablet Take 10 mg by mouth daily as needed for allergies.  Marland Kitchen CRESTOR 10 MG tablet Take 1 tablet by mouth every evening.   Marland Kitchen DYMISTA 137-50 MCG/ACT SUSP USE TWO SPRAY ONCE DAILY  . esomeprazole (NEXIUM) 40 MG  capsule Take 1 capsule by mouth 2 (two) times daily before a meal.  . triamterene-hydrochlorothiazide (DYAZIDE) 37.5-25 MG per capsule Take 1 capsule by mouth every morning.

## 2013-09-20 NOTE — Patient Instructions (Signed)
No change in medications needed at this point  In the event of any respiratory problem immediately increase to symbicort 160 2 every 12 hours  You will need a primary care doctor - I will see you in 6 months if you don't have a primary care doctor by then   If you are satisfied with your treatment plan let your doctor know and he/she can either refill your medications or you can return here when your prescription runs out.     If in any way you are not 100% satisfied,  please tell us.  If 100% better, tell your friends!

## 2013-09-22 NOTE — Assessment & Plan Note (Signed)
I took an extended  opportunity with this patient to outline the consequences of continued cigarette use  in airway disorders based on all the data we have from the multiple national lung health studies (perfomed over decades at millions of dollars in cost)  indicating that smoking cessation, not choice of inhalers or physicians, is the most important aspect of care.   

## 2013-09-22 NOTE — Assessment & Plan Note (Signed)
-   stop acei 05/30/2012  - PFT's 07/25/2012  FEV1 2.08 (63%) 77 ratio and no change p B2 ,  DLCO 97 - hfa 90% 08/29/2012  > confirmed 10/29/2012  - allergy profile 12/10/2012 > eos 3.8% with IgE < 5 neg rast - try off singulair 04/29/13  -med calendar 12/27/2012   Despite smoking and not using symbicort consistently > Adequate control on present rx, reviewed > no change in rx needed

## 2013-09-22 NOTE — Assessment & Plan Note (Addendum)
Trial off acei started 05/30/12 due to pseudoasthma> resolved  Adequate control on present rx, reviewed > no change in rx needed  > referred to primary care for refills  Pulmonary clinic f/u can be prn

## 2013-10-04 ENCOUNTER — Other Ambulatory Visit: Payer: Self-pay | Admitting: Internal Medicine

## 2014-03-19 ENCOUNTER — Other Ambulatory Visit: Payer: Self-pay | Admitting: Internal Medicine

## 2014-03-19 MED ORDER — BUDESONIDE-FORMOTEROL FUMARATE 160-4.5 MCG/ACT IN AERO: INHALATION_SPRAY | RESPIRATORY_TRACT | Status: DC

## 2014-06-02 DIAGNOSIS — C61 Malignant neoplasm of prostate: Secondary | ICD-10-CM | POA: Insufficient documentation

## 2014-06-02 DIAGNOSIS — R9721 Rising PSA following treatment for malignant neoplasm of prostate: Secondary | ICD-10-CM | POA: Insufficient documentation

## 2014-06-24 ENCOUNTER — Emergency Department (HOSPITAL_COMMUNITY)
Admission: EM | Admit: 2014-06-24 | Discharge: 2014-06-25 | Disposition: A | Discharge status: Home / Self Care | Payer: BLUE CROSS/BLUE SHIELD | Attending: Emergency Medicine | Admitting: Emergency Medicine

## 2014-06-24 ENCOUNTER — Encounter (HOSPITAL_COMMUNITY): Payer: Self-pay | Admitting: Emergency Medicine

## 2014-06-24 ENCOUNTER — Emergency Department (HOSPITAL_COMMUNITY): Payer: BLUE CROSS/BLUE SHIELD

## 2014-06-24 DIAGNOSIS — R51 Headache: Secondary | ICD-10-CM | POA: Diagnosis not present

## 2014-06-24 DIAGNOSIS — J45909 Unspecified asthma, uncomplicated: Secondary | ICD-10-CM | POA: Diagnosis not present

## 2014-06-24 DIAGNOSIS — Z8639 Personal history of other endocrine, nutritional and metabolic disease: Secondary | ICD-10-CM | POA: Diagnosis not present

## 2014-06-24 DIAGNOSIS — R05 Cough: Secondary | ICD-10-CM | POA: Diagnosis present

## 2014-06-24 DIAGNOSIS — I10 Essential (primary) hypertension: Secondary | ICD-10-CM | POA: Insufficient documentation

## 2014-06-24 DIAGNOSIS — Z79899 Other long term (current) drug therapy: Secondary | ICD-10-CM | POA: Insufficient documentation

## 2014-06-24 DIAGNOSIS — J45901 Unspecified asthma with (acute) exacerbation: Secondary | ICD-10-CM

## 2014-06-24 DIAGNOSIS — Z720 Tobacco use: Secondary | ICD-10-CM | POA: Insufficient documentation

## 2014-06-24 DIAGNOSIS — Z8546 Personal history of malignant neoplasm of prostate: Secondary | ICD-10-CM | POA: Insufficient documentation

## 2014-06-24 MED ORDER — AZITHROMYCIN 250 MG PO TABS: 250.0000 mg | ORAL_TABLET | Freq: Every day | ORAL | Status: DC

## 2014-06-24 MED ORDER — PREDNISONE 20 MG PO TABS: 40.0000 mg | ORAL_TABLET | Freq: Every day | ORAL | Status: DC

## 2014-06-24 MED ORDER — ALBUTEROL SULFATE HFA 108 (90 BASE) MCG/ACT IN AERS: 2.0000 | INHALATION_SPRAY | RESPIRATORY_TRACT | Status: DC | PRN

## 2014-06-24 MED ORDER — IPRATROPIUM BROMIDE 0.02 % IN SOLN
0.5000 mg | Freq: Once | RESPIRATORY_TRACT | Status: AC
  Administered 2014-06-24: 0.5 mg via RESPIRATORY_TRACT
  Filled 2014-06-24: qty 2.5

## 2014-06-24 MED ORDER — AEROCHAMBER Z-STAT PLUS/MEDIUM MISC
1.0000 | Freq: Once | Status: AC
  Administered 2014-06-24: 1

## 2014-06-24 MED ORDER — PREDNISONE 20 MG PO TABS
60.0000 mg | ORAL_TABLET | Freq: Once | ORAL | Status: AC
  Administered 2014-06-24: 60 mg via ORAL
  Filled 2014-06-24: qty 3

## 2014-06-24 MED ORDER — ACETAMINOPHEN 325 MG PO TABS
650.0000 mg | ORAL_TABLET | Freq: Once | ORAL | Status: AC
  Administered 2014-06-24: 650 mg via ORAL
  Filled 2014-06-24: qty 2

## 2014-06-24 MED ORDER — ALBUTEROL SULFATE (2.5 MG/3ML) 0.083% IN NEBU
5.0000 mg | INHALATION_SOLUTION | Freq: Once | RESPIRATORY_TRACT | Status: AC
  Administered 2014-06-24: 5 mg via RESPIRATORY_TRACT
  Filled 2014-06-24: qty 6

## 2014-06-24 NOTE — ED Provider Notes (Signed)
CSN: [id number]     Arrival date & time 06/24/14  2159 History  This chart was scribed for non-physician practitioner, Britt Bottom, NP, working with Ephraim Hamburger, MD, by Jeanell Sparrow, ED Scribe. This patient was seen in room WTR8/WTR8 and the patient's care was started at 10:51 PM.   Chief Complaint  Patient presents with  . Cough  . Shortness of Breath   The history is provided by the patient. No language interpreter was used.   HPI Comments: Joseph Harvey is a 59 y.o. male with a hx of asthma who presents to the Emergency Department complaining of a constant moderate SOB that started 4 days ago. He states that he has a prior hx of SOB, and he recently started having another SOB flare up that worsened today. He reports that he has been having some associated intermittent moderate cough productive of yellow and clear phlegm. He states that he has also been having some headaches. He reports that he took his emergency inhaler today 4-5 times with temporary relief. He states that he takes symbicort and a daily inhaler for his asthma everyday. He denies any nausea, vomiting, or diarrhea.    Past Medical History  Diagnosis Date  . Allergy     Dust, mold, dust mites  . Asthma   . Cancer     prostate  . Hyperlipidemia   . Hypertension    Past Surgical History  Procedure Laterality Date  . Robot assisted laparoscopic radical prostatectomy  03/2010  . Knee arthroscopy  2005    right   Family History  Problem Relation Age of Onset  . Colon cancer Father   . Lung cancer Mother     was a smoker  . Lung cancer Father     was a smoker  . Asthma Mother   . Allergies Brother    History  Substance Use Topics  . Smoking status: Current Every Day Smoker -- 0.50 packs/day for 30 years    Types: Cigarettes  . Smokeless tobacco: Never Used  . Alcohol Use: 1.8 oz/week    3 Cans of beer per week     Comment: rarely    Review of Systems  Respiratory: Positive for cough and shortness  of breath.   Gastrointestinal: Negative for nausea, vomiting and diarrhea.  Neurological: Positive for headaches.    Allergies  Robitussin  Home Medications   Prior to Admission medications   Medication Sig Start Date End Date Taking? Authorizing Provider  albuterol (PROVENTIL) (2.5 MG/3ML) 0.083% nebulizer solution Take 1 vial by mouth every 4 (four) hours as needed for wheezing or shortness of breath (((PLAN B))).  03/27/11   Historical Provider, MD  albuterol (VENTOLIN HFA) 108 (90 BASE) MCG/ACT inhaler Inhale 2 puffs into the lungs every 4 (four) hours as needed for wheezing or shortness of breath (((PLAN A))).     Historical Provider, MD  amLODipine (NORVASC) 10 MG tablet Take 10 mg by mouth every morning.    Historical Provider, MD  bisoprolol (ZEBETA) 5 MG tablet Take 1 tablet (5 mg total) by mouth 2 (two) times daily. 08/06/13   Tammy S Parrett, NP  budesonide-formoterol (SYMBICORT) 160-4.5 MCG/ACT inhaler Take 2 puffs first thing in am and then another 2 puffs about 12 hours later. 03/19/14   Tanda Rockers, MD  cetirizine (ZYRTEC) 10 MG tablet Take 10 mg by mouth daily as needed for allergies.    Historical Provider, MD  CRESTOR 10 MG tablet Take 1 tablet  by mouth every evening.  10/18/12   Historical Provider, MD  DYMISTA 137-50 MCG/ACT SUSP USE TWO SPRAY(S) INTO NOSTRIL ONCE DAILY 10/04/13   Tanda Rockers, MD  esomeprazole (NEXIUM) 40 MG capsule Take 1 capsule by mouth 2 (two) times daily before a meal. 07/31/13   Historical Provider, MD  triamterene-hydrochlorothiazide (DYAZIDE) 37.5-25 MG per capsule Take 1 capsule by mouth every morning.  08/20/12   Historical Provider, MD   BP 134/89 mmHg  Pulse 89  Resp 20  SpO2 94% Physical Exam  Constitutional: He appears well-developed and well-nourished. No distress.  HENT:  Head: Normocephalic and atraumatic.  Eyes: Conjunctivae are normal. Right eye exhibits no discharge. Left eye exhibits no discharge. No scleral icterus.  Neck: Neck  supple. No tracheal deviation present.  Cardiovascular: Normal rate and intact distal pulses.   Pulmonary/Chest: Effort normal. No respiratory distress. He has no decreased breath sounds. He has wheezes in the right upper field, the right middle field, the right lower field, the left upper field, the left middle field and the left lower field. He has no rhonchi. He has no rales. He exhibits no tenderness.  Musculoskeletal: Normal range of motion.  Neurological: He is alert. Coordination normal.  Skin: Skin is warm and dry. He is not diaphoretic.  Psychiatric: He has a normal mood and affect. His behavior is normal.  Nursing note and vitals reviewed.   ED Course  Procedures (including critical care time) DIAGNOSTIC STUDIES: Oxygen Saturation is 94% on RA, normal by my interpretation.    COORDINATION OF CARE: 10:55 PM- Pt advised of plan for treatment which includes medication and radiology and pt agrees.  Labs Review Labs Reviewed - No data to display  Imaging Review Dg Chest 2 View (if Patient Has Fever And/or Copd)  06/24/2014   CLINICAL DATA:  Shortness of breath, productive cough for 4 days.  EXAM: CHEST  2 VIEW  COMPARISON:  May 30, 2012.  FINDINGS: The heart size and mediastinal contours are within normal limits. Both lungs are clear. No pneumothorax or pleural effusion is noted. The visualized skeletal structures are unremarkable.  IMPRESSION: No acute cardiopulmonary abnormality seen.   Electronically Signed   By: Marijo Conception, M.D.   On: 06/24/2014 22:50     EKG Interpretation None      MDM   Final diagnoses:  Asthma exacerbation   59 yo male with cough and wheezing without improvement with home neb tx. His xray is negative for infiltrate. His wheezing has improved after neb treatments here. Discussed use of a spacer with home MDI. His O2 sats are 95% with ambulation on room air and has no of respiratory distress. Prednisone given in the ED and pt will bd dc with 5  day burst. Also prescribed course of azithromycin as there may be a COPD component as well. Pt states they are breathing at baseline. Pt has been instructed to continue using prescribed medications and to speak with PCP about today's exacerbation. Pt is well-appearing, and in no acute distress. He appears safe to be discharged. Return precautions provided. Pt aware of plan and in agreeemnt.    I personally performed the services described in this documentation, which was scribed in my presence. The recorded information has been reviewed and is accurate.  Filed Vitals:   06/24/14 2218 06/24/14 2303 06/24/14 2359  BP: 134/89  149/83  Pulse: 89  94  Temp:  98 F (36.7 C)   TempSrc:  Oral   Resp:  20  18  SpO2: 94%  95%   Meds given in ED:  Medications  albuterol (PROVENTIL) (2.5 MG/3ML) 0.083% nebulizer solution 5 mg (5 mg Nebulization Given 06/24/14 2330)  ipratropium (ATROVENT) nebulizer solution 0.5 mg (0.5 mg Nebulization Given 06/24/14 2333)  predniSONE (DELTASONE) tablet 60 mg (60 mg Oral Given 06/24/14 2329)  acetaminophen (TYLENOL) tablet 650 mg (650 mg Oral Given 06/24/14 2328)  aerochamber Z-Stat Plus/medium 1 each (1 each Other Given 06/24/14 2333)    Discharge Medication List as of 06/24/2014 11:05 PM    START taking these medications   Details  !! albuterol (PROVENTIL HFA;VENTOLIN HFA) 108 (90 BASE) MCG/ACT inhaler Inhale 2 puffs into the lungs every 4 (four) hours as needed for wheezing or shortness of breath., Starting 06/24/2014, Until Discontinued, Print    azithromycin (ZITHROMAX) 250 MG tablet Take 1 tablet (250 mg total) by mouth daily. Take first 2 tablets together, then 1 every day until finished., Starting 06/24/2014, Until Discontinued, Print    predniSONE (DELTASONE) 20 MG tablet Take 2 tablets (40 mg total) by mouth daily., Starting 06/24/2014, Until Discontinued, Print     !! - Potential duplicate medications found. Please discuss with provider.        Britt Bottom, NP 06/26/14 [place], MD 06/27/14 1827

## 2014-06-24 NOTE — ED Notes (Signed)
Pt from home c/o a non productive cough x several days and shortness of breath.. Wheezing bilaterally. No acute distress. Pt reports being treated with amoxicillin and neb treatments.

## 2014-06-24 NOTE — Discharge Instructions (Signed)
Please follow the directions provided. Be sure to follow-up with your primary care doctor to ensure you're getting better. Please take the prednisone daily for the next 5 days. Please take the antibiotic as directed. You may use the albuterol inhaler 2 puffs every 4 hours as needed for cough, shortness of breath, or wheezing. Please use your spacer as directed to help make your breathing medicine more effective. Don't hesitate to return for any new, worsening, or concerning symptoms.  SEEK IMMEDIATE MEDICAL CARE IF:  You seem to be getting worse and are unresponsive to treatment during an asthma attack.  You are short of breath even at rest.  You get short of breath when doing very little physical activity.  You have difficulty eating, drinking, or talking due to asthma symptoms.  You develop chest pain.  You develop a fast heartbeat.  You have a bluish color to your lips or fingernails.  You are light-headed, dizzy, or faint.

## 2014-07-24 ENCOUNTER — Emergency Department (HOSPITAL_COMMUNITY): Payer: BLUE CROSS/BLUE SHIELD

## 2014-07-24 ENCOUNTER — Encounter (HOSPITAL_COMMUNITY): Payer: Self-pay | Admitting: Emergency Medicine

## 2014-07-24 ENCOUNTER — Emergency Department (HOSPITAL_COMMUNITY)
Admission: EM | Admit: 2014-07-24 | Discharge: 2014-07-24 | Disposition: A | Discharge status: Home / Self Care | Payer: BLUE CROSS/BLUE SHIELD | Attending: Emergency Medicine | Admitting: Emergency Medicine

## 2014-07-24 DIAGNOSIS — Z720 Tobacco use: Secondary | ICD-10-CM | POA: Diagnosis not present

## 2014-07-24 DIAGNOSIS — R079 Chest pain, unspecified: Secondary | ICD-10-CM | POA: Diagnosis present

## 2014-07-24 DIAGNOSIS — E785 Hyperlipidemia, unspecified: Secondary | ICD-10-CM | POA: Insufficient documentation

## 2014-07-24 DIAGNOSIS — I10 Essential (primary) hypertension: Secondary | ICD-10-CM | POA: Diagnosis not present

## 2014-07-24 DIAGNOSIS — Z79899 Other long term (current) drug therapy: Secondary | ICD-10-CM | POA: Diagnosis not present

## 2014-07-24 DIAGNOSIS — Z8546 Personal history of malignant neoplasm of prostate: Secondary | ICD-10-CM | POA: Diagnosis not present

## 2014-07-24 DIAGNOSIS — J45901 Unspecified asthma with (acute) exacerbation: Secondary | ICD-10-CM | POA: Insufficient documentation

## 2014-07-24 DIAGNOSIS — Z792 Long term (current) use of antibiotics: Secondary | ICD-10-CM | POA: Diagnosis not present

## 2014-07-24 DIAGNOSIS — R52 Pain, unspecified: Secondary | ICD-10-CM

## 2014-07-24 LAB — BASIC METABOLIC PANEL
ANION GAP: 8 (ref 5–15)
BUN: 21 mg/dL (ref 6–23)
CALCIUM: 8.9 mg/dL (ref 8.4–10.5)
CO2: 27 mmol/L (ref 19–32)
CREATININE: 1.12 mg/dL (ref 0.50–1.35)
Chloride: 100 mmol/L (ref 96–112)
GFR calc Af Amer: 82 mL/min — ABNORMAL LOW (ref >90)
GFR, EST NON AFRICAN AMERICAN: 71 mL/min — AB (ref >90)
GLUCOSE: 97 mg/dL (ref 70–99)
Potassium: 3.3 mmol/L — ABNORMAL LOW (ref 3.5–5.1)
Sodium: 135 mmol/L (ref 135–145)

## 2014-07-24 LAB — CBC
HEMATOCRIT: 41.9 % (ref 39.0–52.0)
HEMOGLOBIN: 14.9 g/dL (ref 13.0–17.0)
MCH: 32.7 pg (ref 26.0–34.0)
MCHC: 35.6 g/dL (ref 30.0–36.0)
MCV: 92.1 fL (ref 78.0–100.0)
Platelets: 344 10*3/uL (ref 150–400)
RBC: 4.55 MIL/uL (ref 4.22–5.81)
RDW: 12.6 % (ref 11.5–15.5)
WBC: 7.4 10*3/uL (ref 4.0–10.5)

## 2014-07-24 LAB — I-STAT TROPONIN, ED
TROPONIN I, POC: 0 ng/mL (ref 0.00–0.08)
Troponin i, poc: 0.01 ng/mL (ref 0.00–0.08)

## 2014-07-24 LAB — D-DIMER, QUANTITATIVE (NOT AT ARMC)

## 2014-07-24 LAB — BRAIN NATRIURETIC PEPTIDE: B Natriuretic Peptide: 25.1 pg/mL (ref 0.0–100.0)

## 2014-07-24 MED ORDER — IOHEXOL 350 MG/ML SOLN
100.0000 mL | Freq: Once | INTRAVENOUS | Status: AC | PRN
  Administered 2014-07-24: 100 mL via INTRAVENOUS

## 2014-07-24 MED ORDER — PREDNISONE 50 MG PO TABS: 50.0000 mg | ORAL_TABLET | Freq: Every day | ORAL | Status: DC

## 2014-07-24 NOTE — ED Notes (Signed)
Pt states that he went to his PCP for 3 days of chest pain, numbness to lower legs, dizziness, lightheadedness.  States that the doctor's office wanted to send him to ER by ambulance but he declined.

## 2014-07-24 NOTE — Discharge Instructions (Signed)
Asthma Asthma is a recurring condition in which the airways tighten and narrow. Asthma can make it difficult to breathe. It can cause coughing, wheezing, and shortness of breath. Asthma episodes, also called asthma attacks, range from minor to life-threatening. Asthma cannot be cured, but medicines and lifestyle changes can help control it. CAUSES Asthma is believed to be caused by inherited (genetic) and environmental factors, but its exact cause is unknown. Asthma may be triggered by allergens, lung infections, or irritants in the air. Asthma triggers are different for each person. Common triggers include:   Animal dander.  Dust mites.  Cockroaches.  Pollen from trees or grass.  Mold.  Smoke.  Air pollutants such as dust, household cleaners, hair sprays, aerosol sprays, paint fumes, strong chemicals, or strong odors.  Cold air, weather changes, and winds (which increase molds and pollens in the air).  Strong emotional expressions such as crying or laughing hard.  Stress.  Certain medicines (such as aspirin) or types of drugs (such as beta-blockers).  Sulfites in foods and drinks. Foods and drinks that may contain sulfites include dried fruit, potato chips, and sparkling grape juice.  Infections or inflammatory conditions such as the flu, a cold, or an inflammation of the nasal membranes (rhinitis).  Gastroesophageal reflux disease (GERD).  Exercise or strenuous activity. SYMPTOMS Symptoms may occur immediately after asthma is triggered or many hours later. Symptoms include:  Wheezing.  Excessive nighttime or early morning coughing.  Frequent or severe coughing with a common cold.  Chest tightness.  Shortness of breath. DIAGNOSIS  The diagnosis of asthma is made by a review of your medical history and a physical exam. Tests may also be performed. These may include:  Lung function studies. These tests show how much air you breathe in and out.  Allergy  tests.  Imaging tests such as X-rays. TREATMENT  Asthma cannot be cured, but it can usually be controlled. Treatment involves identifying and avoiding your asthma triggers. It also involves medicines. There are 2 classes of medicine used for asthma treatment:   Controller medicines. These prevent asthma symptoms from occurring. They are usually taken every day.  Reliever or rescue medicines. These quickly relieve asthma symptoms. They are used as needed and provide short-term relief. Your health care provider will help you create an asthma action plan. An asthma action plan is a written plan for managing and treating your asthma attacks. It includes a list of your asthma triggers and how they may be avoided. It also includes information on when medicines should be taken and when their dosage should be changed. An action plan may also involve the use of a device called a peak flow meter. A peak flow meter measures how well the lungs are working. It helps you monitor your condition. HOME CARE INSTRUCTIONS   Take medicines only as directed by your health care provider. Speak with your health care provider if you have questions about how or when to take the medicines.  Use a peak flow meter as directed by your health care provider. Record and keep track of readings.  Understand and use the action plan to help minimize or stop an asthma attack without needing to seek medical care.  Control your home environment in the following ways to help prevent asthma attacks:  Do not smoke. Avoid being exposed to secondhand smoke.  Change your heating and air conditioning filter regularly.  Limit your use of fireplaces and wood stoves.  Get rid of pests (such as roaches and  mice) and their droppings.  Throw away plants if you see mold on them.  Clean your floors and dust regularly. Use unscented cleaning products.  Try to have someone else vacuum for you regularly. Stay out of rooms while they are  being vacuumed and for a short while afterward. If you vacuum, use a dust mask from a hardware store, a double-layered or microfilter vacuum cleaner bag, or a vacuum cleaner with a HEPA filter.  Replace carpet with wood, tile, or vinyl flooring. Carpet can trap dander and dust.  Use allergy-proof pillows, mattress covers, and box spring covers.  Wash bed sheets and blankets every week in hot water and dry them in a dryer.  Use blankets that are made of polyester or cotton.  Clean bathrooms and kitchens with bleach. If possible, have someone repaint the walls in these rooms with mold-resistant paint. Keep out of the rooms that are being cleaned and painted.  Wash hands frequently. SEEK MEDICAL CARE IF:   You have wheezing, shortness of breath, or a cough even if taking medicine to prevent attacks.  The colored mucus you cough up (sputum) is thicker than usual.  Your sputum changes from clear or white to yellow, green, gray, or bloody.  You have any problems that may be related to the medicines you are taking (such as a rash, itching, swelling, or trouble breathing).  You are using a reliever medicine more than 2-3 times per week.  Your peak flow is still at 50-79% of your personal best after following your action plan for 1 hour.  You have a fever. SEEK IMMEDIATE MEDICAL CARE IF:   You seem to be getting worse and are unresponsive to treatment during an asthma attack.  You are short of breath even at rest.  You get short of breath when doing very little physical activity.  You have difficulty eating, drinking, or talking due to asthma symptoms.  You develop chest pain.  You develop a fast heartbeat.  You have a bluish color to your lips or fingernails.  You are light-headed, dizzy, or faint.  Your peak flow is less than 50% of your personal best. MAKE SURE YOU:   Understand these instructions.  Will watch your condition.  Will get help right away if you are not  doing well or get worse. Document Released: 05/02/2005 Document Revised: 09/16/2013 Document Reviewed: 11/29/2012 [facility] Patient Information 2015 [place], [place]. This information is not intended to replace advice given to you by your health care provider. Make sure you discuss any questions you have with your health care provider. Chest Pain (Nonspecific) It is often hard to give a specific diagnosis for the cause of chest pain. There is always a chance that your pain could be related to something serious, such as a heart attack or a blood clot in the lungs. You need to follow up with your health care provider for further evaluation. CAUSES   Heartburn.  Pneumonia or bronchitis.  Anxiety or stress.  Inflammation around your heart (pericarditis) or lung (pleuritis or pleurisy).  A blood clot in the lung.  A collapsed lung (pneumothorax). It can develop suddenly on its own (spontaneous pneumothorax) or from trauma to the chest.  Shingles infection (herpes zoster virus). The chest wall is composed of bones, muscles, and cartilage. Any of these can be the source of the pain.  The bones can be bruised by injury.  The muscles or cartilage can be strained by coughing or overwork.  The cartilage can  be affected by inflammation and become sore (costochondritis). DIAGNOSIS  Lab tests or other studies may be needed to find the cause of your pain. Your health care provider may have you take a test called an ambulatory electrocardiogram (ECG). An ECG records your heartbeat patterns over a 24-hour period. You may also have other tests, such as:  Transthoracic echocardiogram (TTE). During echocardiography, sound waves are used to evaluate how blood flows through your heart.  Transesophageal echocardiogram (TEE).  Cardiac monitoring. This allows your health care provider to monitor your heart rate and rhythm in real time.  Holter monitor. This is a portable device that records your heartbeat  and can help diagnose heart arrhythmias. It allows your health care provider to track your heart activity for several days, if needed.  Stress tests by exercise or by giving medicine that makes the heart beat faster. TREATMENT   Treatment depends on what may be causing your chest pain. Treatment may include:  Acid blockers for heartburn.  Anti-inflammatory medicine.  Pain medicine for inflammatory conditions.  Antibiotics if an infection is present.  You may be advised to change lifestyle habits. This includes stopping smoking and avoiding alcohol, caffeine, and chocolate.  You may be advised to keep your head raised (elevated) when sleeping. This reduces the chance of acid going backward from your stomach into your esophagus. Most of the time, nonspecific chest pain will improve within 2-3 days with rest and mild pain medicine.  HOME CARE INSTRUCTIONS   If antibiotics were prescribed, take them as directed. Finish them even if you start to feel better.  For the next few days, avoid physical activities that bring on chest pain. Continue physical activities as directed.  Do not use any tobacco products, including cigarettes, chewing tobacco, or electronic cigarettes.  Avoid drinking alcohol.  Only take medicine as directed by your health care provider.  Follow your health care provider's suggestions for further testing if your chest pain does not go away.  Keep any follow-up appointments you made. If you do not go to an appointment, you could develop lasting (chronic) problems with pain. If there is any problem keeping an appointment, call to reschedule. SEEK MEDICAL CARE IF:   Your chest pain does not go away, even after treatment.  You have a rash with blisters on your chest.  You have a fever. SEEK IMMEDIATE MEDICAL CARE IF:   You have increased chest pain or pain that spreads to your arm, neck, jaw, back, or abdomen.  You have shortness of breath.  You have an  increasing cough, or you cough up blood.  You have severe back or abdominal pain.  You feel nauseous or vomit.  You have severe weakness.  You faint.  You have chills. This is an emergency. Do not wait to see if the pain will go away. Get medical help at once. Call your local emergency services (911 in [place] Do not drive yourself to the hospital. MAKE SURE YOU:   Understand these instructions.  Will watch your condition.  Will get help right away if you are not doing well or get worse. Document Released: 02/09/2005 Document Revised: 05/07/2013 Document Reviewed: 12/06/2007 [facility] Patient Information 2015 [place], [place]. This information is not intended to replace advice given to you by your health care provider. Make sure you discuss any questions you have with your health care provider.

## 2014-07-24 NOTE — ED Notes (Signed)
EDP [facility] PRESENT TO EVALUATE THIS PT

## 2014-07-24 NOTE — ED Provider Notes (Signed)
CSN: [id number]     Arrival date & time 07/24/14  1221 History   First MD Initiated Contact with Patient 07/24/14 1650     Chief Complaint  Patient presents with  . Chest Pain  . Numbness     (Consider location/radiation/quality/duration/timing/severity/associated sxs/prior Treatment) Patient is a 59 y.o. male presenting with chest pain.  Chest Pain Pain location:  L chest Pain quality: pressure   Pain radiates to:  Does not radiate Pain radiates to the back: no   Pain severity:  Moderate Onset quality:  Gradual Duration:  3 days Timing:  Constant Progression:  Unchanged Chronicity:  New Context: at rest   Relieved by:  Nothing Worsened by:  Deep breathing Ineffective treatments:  None tried Associated symptoms: shortness of breath   Associated symptoms: no fever   Associated symptoms comment:  Tingling in bil le, RUE   Past Medical History  Diagnosis Date  . Allergy     Dust, mold, dust mites  . Asthma   . Cancer     prostate  . Hyperlipidemia   . Hypertension    Past Surgical History  Procedure Laterality Date  . Robot assisted laparoscopic radical prostatectomy  03/2010  . Knee arthroscopy  2005    right   Family History  Problem Relation Age of Onset  . Colon cancer Father   . Lung cancer Mother     was a smoker  . Lung cancer Father     was a smoker  . Asthma Mother   . Allergies Brother    History  Substance Use Topics  . Smoking status: Current Every Day Smoker -- 0.50 packs/day for 30 years    Types: Cigarettes  . Smokeless tobacco: Never Used  . Alcohol Use: 1.8 oz/week    3 Cans of beer per week     Comment: rarely    Review of Systems  Constitutional: Negative for fever.  Respiratory: Positive for shortness of breath.   Cardiovascular: Positive for chest pain.  All other systems reviewed and are negative.     Allergies  Robitussin  Home Medications   Prior to Admission medications   Medication Sig Start Date End Date  Taking? Authorizing Provider  albuterol (PROVENTIL HFA;VENTOLIN HFA) 108 (90 BASE) MCG/ACT inhaler Inhale 2 puffs into the lungs every 4 (four) hours as needed for wheezing or shortness of breath. 06/24/14  Yes Britt Bottom, NP  albuterol (PROVENTIL) (2.5 MG/3ML) 0.083% nebulizer solution Take 1 vial by mouth every 4 (four) hours as needed for wheezing or shortness of breath (((PLAN B))).  03/27/11  Yes Historical Provider, MD  amLODipine (NORVASC) 10 MG tablet Take 10 mg by mouth every morning.   Yes Historical Provider, MD  ATROVENT HFA 17 MCG/ACT inhaler Inhale 2 puffs into the lungs every 6 (six) hours as needed for wheezing.  07/21/14  Yes Historical Provider, MD  bisoprolol (ZEBETA) 5 MG tablet Take 1 tablet (5 mg total) by mouth 2 (two) times daily. 08/06/13  Yes Tammy S Parrett, NP  budesonide-formoterol (SYMBICORT) 160-4.5 MCG/ACT inhaler Take 2 puffs first thing in am and then another 2 puffs about 12 hours later. 03/19/14  Yes Tanda Rockers, MD  candesartan (ATACAND) 16 MG tablet Take 1 tablet by mouth daily. 07/21/14  Yes Historical Provider, MD  cefUROXime (CEFTIN) 500 MG tablet Take 1 tablet by mouth every 12 (twelve) hours. 05/17/14  Yes Historical Provider, MD  cetirizine (ZYRTEC) 10 MG tablet Take 10 mg by mouth daily as  needed for allergies.   Yes Historical Provider, MD  DYMISTA 137-50 MCG/ACT SUSP USE TWO SPRAY(S) INTO NOSTRIL ONCE DAILY 10/04/13  Yes Tanda Rockers, MD  esomeprazole (NEXIUM) 40 MG capsule Take 1 capsule by mouth 2 (two) times daily before a meal. 07/31/13  Yes Historical Provider, MD  hydrochlorothiazide (HYDRODIURIL) 25 MG tablet Take 1 tablet by mouth daily. 07/12/14  Yes Historical Provider, MD  montelukast (SINGULAIR) 10 MG tablet Take 1 tablet by mouth at bedtime. 04/19/14  Yes Historical Provider, MD  rosuvastatin (CRESTOR) 20 MG tablet Take 20 mg by mouth daily.   Yes Historical Provider, MD  SUPRAX 200 MG CHEW Chew 1 tablet by mouth 2 (two) times daily. 07/14/14   Yes Historical Provider, MD  triamterene-hydrochlorothiazide (DYAZIDE) 37.5-25 MG per capsule Take 1 capsule by mouth every morning.  08/20/12  Yes Historical Provider, MD  azithromycin (ZITHROMAX) 250 MG tablet Take 1 tablet (250 mg total) by mouth daily. Take first 2 tablets together, then 1 every day until finished. 06/24/14   Britt Bottom, NP  predniSONE (DELTASONE) 50 MG tablet Take 1 tablet (50 mg total) by mouth daily. 07/24/14   Debby Freiberg, MD   BP 139/82 mmHg  Pulse 88  Temp(Src) 97.8 F (36.6 C) (Oral)  Resp 20  Ht 5' 8.5" (1.74 m)  Wt 227 lb (102.967 kg)  BMI 34.01 kg/m2  SpO2 98% Physical Exam  Constitutional: He is oriented to person, place, and time. He appears well-developed and well-nourished.  HENT:  Head: Normocephalic and atraumatic.  Eyes: Conjunctivae and EOM are normal.  Neck: Normal range of motion. Neck supple.  Cardiovascular: Normal rate, regular rhythm and normal heart sounds.   Pulmonary/Chest: Effort normal and breath sounds normal. No respiratory distress.  Abdominal: He exhibits no distension. There is no tenderness. There is no rebound and no guarding.  Musculoskeletal: Normal range of motion.  Neurological: He is alert and oriented to person, place, and time. He has normal strength. No cranial nerve deficit or sensory deficit. Coordination normal.  Skin: Skin is warm and dry.  Vitals reviewed.   ED Course  Procedures (including critical care time) Labs Review Labs Reviewed  BASIC METABOLIC PANEL - Abnormal; Notable for the following:    Potassium 3.3 (*)    GFR calc non Af Amer 71 (*)    GFR calc Af Amer 82 (*)    All other components within normal limits  CBC  BRAIN NATRIURETIC PEPTIDE  D-DIMER, QUANTITATIVE  I-STAT TROPOININ, ED  Randolm Idol, ED    Imaging Review Dg Chest 2 View  07/24/2014   CLINICAL DATA:  Chest pain for 3 days. Dizziness. History of asthma and prostate cancer  EXAM: CHEST  2 VIEW  COMPARISON:  Radiograph  06/24/2014, 05/11/2010, 05/30/2012  FINDINGS: Normal cardiac silhouette. Lobular contour of the left is suprahilar vascular structures is increased from radiograph of 02/14/2011. No effusion, infiltrate, or pneumothorax. No acute osseous abnormality.  IMPRESSION: Increased lobular contour of the left suprahilar tissue. This is likely benign vascular structures; however recommend CT thorax with contrast for further evaluation.   Electronically Signed   By: Suzy Bouchard M.D.   On: 07/24/2014 18:05   Ct Angio Chest Aorta W/cm &/or Wo/cm  07/24/2014   CLINICAL DATA:  Three-day history of chest pain, numbness to the lower legs, dizziness, and lightheadedness. Previous visit primary care physician for the symptoms.  EXAM: CT ANGIOGRAPHY CHEST, ABDOMEN AND PELVIS  TECHNIQUE: Multidetector CT imaging through the chest, abdomen and  pelvis was performed using the standard protocol during bolus administration of intravenous contrast. Multiplanar reconstructed images and MIPs were obtained and reviewed to evaluate the vascular anatomy.  CONTRAST:  162mL OMNIPAQUE IOHEXOL 350 MG/ML SOLN  COMPARISON:  CT abdomen and pelvis 05/24/2013  FINDINGS: CTA CHEST FINDINGS  Noncontrast CT chest demonstrates minimal aortic calcification. No significant Coronary artery calcification. Normal caliber thoracic aorta. No evidence of intramural hematoma.  Arterial phase contrast-enhanced images of the chest demonstrate normal caliber thoracic aorta. No evidence of aortic dissection, allowing for motion artifact. Great vessel origins are patent.  Normal heart size. Central pulmonary arteries are moderately well opacified without evidence of large central pulmonary embolus. Esophagus is mostly decompressed. No significant lymphadenopathy in the chest.  Evaluation of lungs is limited due to respiratory motion artifact but no focal consolidation or airspace disease is suggested. No pleural effusion. No pneumothorax. Airways appear patent.   Review of the MIP images confirms the above findings.  CTA ABDOMEN AND PELVIS FINDINGS  Contrast bolus in the abdominal aorta is somewhat limited but no evidence of aneurysm or significant dissection is appreciated. Calcification in the aorta and iliac arteries. Renal nephrograms are symmetrical.  Diffuse fatty infiltration of the liver. Small bilateral adrenal gland nodules containing fat, likely adenomas. No significant change since prior study. The gallbladder, spleen, pancreas, kidneys, inferior vena cava, and retroperitoneal lymph nodes are unremarkable. Stomach, small bowel, and colon are not abnormally distended. Diffusely stool-filled colon. No free air or free fluid in the abdomen. Abdominal wall musculature appears intact.  Pelvis: Appendix is normal. No inflammatory changes in the pelvis. Prostate gland is not enlarged. Bladder wall is not thickened. No pelvic mass or lymphadenopathy. No free or loculated pelvic fluid collections. No destructive bone lesions.  Review of the MIP images confirms the above findings.  IMPRESSION: No evidence of thoracic or abdominal aortic dissection. No significant central pulmonary embolus. No evidence of active pulmonary disease. Diffuse fatty infiltration of the liver. Small bilateral adrenal gland nodules, likely adenomas. No evidence of bowel obstruction.   Electronically Signed   By: Lucienne Capers M.D.   On: 07/24/2014 21:39   Ct Angio Abd/pel W/ And/or W/o  07/24/2014   CLINICAL DATA:  Three-day history of chest pain, numbness to the lower legs, dizziness, and lightheadedness. Previous visit primary care physician for the symptoms.  EXAM: CT ANGIOGRAPHY CHEST, ABDOMEN AND PELVIS  TECHNIQUE: Multidetector CT imaging through the chest, abdomen and pelvis was performed using the standard protocol during bolus administration of intravenous contrast. Multiplanar reconstructed images and MIPs were obtained and reviewed to evaluate the vascular anatomy.  CONTRAST:   155mL OMNIPAQUE IOHEXOL 350 MG/ML SOLN  COMPARISON:  CT abdomen and pelvis 05/24/2013  FINDINGS: CTA CHEST FINDINGS  Noncontrast CT chest demonstrates minimal aortic calcification. No significant Coronary artery calcification. Normal caliber thoracic aorta. No evidence of intramural hematoma.  Arterial phase contrast-enhanced images of the chest demonstrate normal caliber thoracic aorta. No evidence of aortic dissection, allowing for motion artifact. Great vessel origins are patent.  Normal heart size. Central pulmonary arteries are moderately well opacified without evidence of large central pulmonary embolus. Esophagus is mostly decompressed. No significant lymphadenopathy in the chest.  Evaluation of lungs is limited due to respiratory motion artifact but no focal consolidation or airspace disease is suggested. No pleural effusion. No pneumothorax. Airways appear patent.  Review of the MIP images confirms the above findings.  CTA ABDOMEN AND PELVIS FINDINGS  Contrast bolus in the abdominal aorta is somewhat  limited but no evidence of aneurysm or significant dissection is appreciated. Calcification in the aorta and iliac arteries. Renal nephrograms are symmetrical.  Diffuse fatty infiltration of the liver. Small bilateral adrenal gland nodules containing fat, likely adenomas. No significant change since prior study. The gallbladder, spleen, pancreas, kidneys, inferior vena cava, and retroperitoneal lymph nodes are unremarkable. Stomach, small bowel, and colon are not abnormally distended. Diffusely stool-filled colon. No free air or free fluid in the abdomen. Abdominal wall musculature appears intact.  Pelvis: Appendix is normal. No inflammatory changes in the pelvis. Prostate gland is not enlarged. Bladder wall is not thickened. No pelvic mass or lymphadenopathy. No free or loculated pelvic fluid collections. No destructive bone lesions.  Review of the MIP images confirms the above findings.  IMPRESSION: No  evidence of thoracic or abdominal aortic dissection. No significant central pulmonary embolus. No evidence of active pulmonary disease. Diffuse fatty infiltration of the liver. Small bilateral adrenal gland nodules, likely adenomas. No evidence of bowel obstruction.   Electronically Signed   By: Lucienne Capers M.D.   On: 07/24/2014 21:39     EKG Interpretation   Date/Time:  Thursday July 24 2014 12:31:56 EST Ventricular Rate:  84 PR Interval:  145 QRS Duration: 84 QT Interval:  388 QTC Calculation: 459 R Axis:   76 Text Interpretation:  Sinus rhythm Left atrial enlargement Anteroseptal  infarct, age indeterminate No significant change since last tracing  Confirmed by Debby Freiberg [phone]) on 07/24/2014 4:57:31 PM      MDM   Final diagnoses:  Chest pain  Asthma exacerbation    59 y.o. male with pertinent PMH of asthma with recently increased medication presents with chest pain as above.  Seen by urgent care and sent for further evaluation.  On arrival vitals and physical exam as above.  Delta troponin unremarkable.  XR with ? Mediastinal widening.  CT scan obtained and unremarkable.  DC home to fu with cardiology  I have reviewed all laboratory and imaging studies if ordered as above  1. Asthma exacerbation   2. Chest pain   3. Pain   4. Chest pain, unspecified chest pain type         Debby Freiberg, MD 07/25/14 1626

## 2014-10-22 ENCOUNTER — Emergency Department (HOSPITAL_COMMUNITY)
Admission: EM | Admit: 2014-10-22 | Discharge: 2014-10-22 | Disposition: A | Discharge status: Home / Self Care | Payer: BLUE CROSS/BLUE SHIELD | Attending: Emergency Medicine | Admitting: Emergency Medicine

## 2014-11-04 ENCOUNTER — Encounter: Payer: Self-pay | Admitting: Gastroenterology

## 2014-12-15 DIAGNOSIS — J309 Allergic rhinitis, unspecified: Secondary | ICD-10-CM | POA: Insufficient documentation

## 2014-12-26 ENCOUNTER — Other Ambulatory Visit: Payer: Self-pay | Admitting: Internal Medicine

## 2015-03-06 ENCOUNTER — Encounter: Payer: Self-pay | Admitting: Internal Medicine

## 2015-03-06 ENCOUNTER — Ambulatory Visit (INDEPENDENT_AMBULATORY_CARE_PROVIDER_SITE_OTHER): Payer: BLUE CROSS/BLUE SHIELD | Admitting: Internal Medicine

## 2015-03-06 VITALS — BP 134/86 | HR 81 | Ht 69.0 in | Wt 233.0 lb

## 2015-03-06 DIAGNOSIS — F17200 Nicotine dependence, unspecified, uncomplicated: Secondary | ICD-10-CM

## 2015-03-06 DIAGNOSIS — J4540 Moderate persistent asthma, uncomplicated: Secondary | ICD-10-CM | POA: Diagnosis not present

## 2015-03-06 DIAGNOSIS — F17210 Nicotine dependence, cigarettes, uncomplicated: Secondary | ICD-10-CM

## 2015-03-06 MED ORDER — BUDESONIDE-FORMOTEROL FUMARATE 160-4.5 MCG/ACT IN AERO: INHALATION_SPRAY | RESPIRATORY_TRACT | Status: DC

## 2015-03-06 NOTE — Patient Instructions (Signed)
Finish prednisone (10 day course)  Symbicort 160 Take 2 puffs first thing in am and then another 2 puffs about 12 hours later and Stop Breo   Only use your albuterol as a rescue medication to be used if you can't catch your breath by resting or doing a relaxed purse lip breathing pattern.  - The less you use it, the better it will work when you need it. - Ok to use up to 2 puffs  every 4 hours if you must but call for immediate appointment if use goes up over your usual need - Don't leave home without it !!  (think of it like the spare tire for your car)   Nexium 40 mg Take 30- 60 min before your first and last meals of the day   GERD (REFLUX)  is an extremely common cause of respiratory symptoms just like yours , many times with no obvious heartburn at all.    It can be treated with medication, but also with lifestyle changes including elevation of the head of your bed (ideally with 6 inch  bed blocks),  Smoking cessation, avoidance of late meals, excessive alcohol, and avoid fatty foods, chocolate, peppermint, colas, red wine, and acidic juices such as orange juice.  NO MINT OR MENTHOL PRODUCTS SO NO COUGH DROPS  USE SUGARLESS CANDY INSTEAD (Jolley ranchers or Stover's or Life Savers) or even ice chips will also do - the key is to swallow to prevent all throat clearing. NO OIL BASED VITAMINS - use powdered substitutes.  Please schedule a follow up office visit in 6 weeks, call sooner if needed      Return in 10  days with all meds in hand in two separate bags, the automatics vs the as needed and I will see if I can clear you for surgery

## 2015-03-06 NOTE — Progress Notes (Signed)
Subjective:    Patient ID: Joseph Harvey, male    DOB: [birth year]   MRN: [id number]  Brief patient profile:  9 yobm smoker prev eval by Joseph Harvey and Joseph Harvey around 2010 for "allergies" = chronic cough and sob Pos dust, mold rec avoidance and took shots x 57months no help so stopped and on multiple inhalers > not helping so referred himself to pulmonary clinic 05/30/2012 for eval of sob with pfts s airflow obst on rx 07/25/12   History of Present Illness  05/30/2012 1st pulmonary eval on ACEI on multiple inhalers cc sob in spells x 3-4 years, nothing really helps x steroid shots, happens a week at a time and happened last around Christmas 2013 staying on singulair and dulera not really better with proaire/neb.  Assoc with non-productive cough and sense of throat congestion and pnds  rec Stop lisinopril Start benicar 20 mg one daily  Continue dulera and singulair for now  12/10/2012 f/u ov/Joseph Harvey re ? Asthma flare still smoking Chief Complaint  Patient presents with  . Acute Visit    Pt c/o increased SOB x 3 wks- went to UC on 11/16/12 and was given prednisone taper and then again a wk later was given "two shots". He states has not noticed much improvement since then. He also c/o non prod cough and wheezing.   using lots of saba in various forms day > night with only mild improvement in symptoms >>Dulera rx and PPI/Pepcid    07/18/2013 f/u ov/Joseph Harvey re: asthma/ still smoking / no med calendar  Chief Complaint  Patient presents with  . Follow-up    Pt states that his breathing is overall doing well.  He does c/o PND for the past 3 months.     Added xyzal at bedtime (was on Zyrtec)  Still using saba avg hfa twice daily - no purulent sputum >>changed bystolic to bisoprolol and xyzal to zyrtec   08/06/2013 Follow up  MW pt here for est med calendar - pt did not bring any meds with him today.  reports breathing is improved since last ov. Doing breathing exercises that he saw on You Tube. Unfortunately he stopped  his symbicort. Has not used albuterol inhaler. Says he is feeling good with no flare of cough or wheezing.  He was changed from bystolic to bisoprolo 5mg  daily  . Says b/p is good in am but high in evening.  Still has some nasal drip  rec Increase Bisoprolol 5mg  Twice daily   Restart Symbicort 160 2 puffs Twice daily  , rinse after inhaler use.  MUST STOP SMOKING   Follow up with your family doctor for blood pressure  Follow up Joseph. Melvyn Harvey  In 6 weeks and .As needed   Bring all your meds to next visit.    09/20/2013 f/u ov/Joseph Harvey re: chronic asthma / still actively smoking / no meds or med calendar  Chief Complaint  Patient presents with  . Follow-up    Breathing is good. no complaints today  Only using symbicort 160 2bid prn usually before exercise and says never needs saba at all rec No change in medications needed at this point In the event of any respiratory problem immediately increase to symbicort 160 2 every 12 hours   03/06/2015  Acute  ov/Joseph Harvey re: referred by Joseph Harvey for preop eval  for asthma / ? OSA / still smoking  Chief Complaint  Patient presents with  . Acute Visit    Pt c/o wheezing "on and off". He  has occ cough- prod with brown sputum.  He has had tightness in his throat which he relates to PND.    admits not consistent with symbicort but denies having any symptoms at hs with cough sob or daytime hypersomnolence     No obvious day to day or daytime variabilty or assoc  cp or chest tightness, subjective wheeze overt sinus or hb symptoms. No unusual exp hx or h/o childhood pna/ asthma or knowledge of premature birth.  Sleeping ok without nocturnal  or early am exacerbation  of respiratory  c/o's or need for noct saba. Also denies any obvious fluctuation of symptoms with weather or environmental changes or other aggravating or alleviating factors except as outlined above   Current Medications, Allergies, Complete Past Medical History, Past Surgical History, Family History,  and Social History were reviewed in [employer] record.  ROS  The following are not active complaints unless bolded sore throat, dysphagia, dental problems, itching, sneezing,  nasal congestion or excess/ purulent secretions, ear ache,   fever, chills, sweats, unintended wt loss, pleuritic or exertional cp, hemoptysis,  orthopnea pnd or leg swelling, presyncope, palpitations, heartburn, abdominal pain, anorexia, nausea, vomiting, diarrhea  or change in bowel or urinary habits, change in stools or urine, dysuria,hematuria,  rash, arthralgias, visual complaints, headache, numbness weakness or ataxia or problems with walking or coordination,  change in mood/affect or memory.                Objective:   Physical Exam  Pleasant amb obese mod hoarse bm nad/ vital signs removed   07/25/2012  Wt  224 >  212 08/29/12 > 10/29/2012  216 > 12/10/2012 220 > 220 12/27/2012 > 02/07/2013  227 > 03/08/2013  222 > 04/19/2013  233 > 232 07/18/2013 >213 08/06/2013 >  229 09/20/2013 > 03/06/2015  233   HEENT: upper dentures, lower partial dentures/ mod nonspecific swelling of bilateral  turbinates, and orophanx is ok but airway is M III/IV. Nl external ear canals without cough reflex   NECK :  without JVD/Nodes/TM/ nl carotid upstrokes bilaterally   LUNGS: no acc muscle use,  Exp wheeze much better with plm    CV:  RRR  no s3 or murmur or increase in P2, no edema   ABD:  soft and nontender with nl excursion in the supine position. No bruits or organomegaly, bowel sounds nl  MS:  warm without deformities, calf tenderness, cyanosis or clubbing  SKIN: warm and dry without lesions        I personally reviewed images and agree with radiology impression as follows:  CTa chest   07/24/14 No evidence of thoracic or abdominal aortic dissection. No significant central pulmonary embolus. No evidence of active pulmonary disease. Diffuse fatty infiltration of the liver. Small bilateral adrenal  gland nodules, likely adenomas. No evidence of bowel obstruction.             Assessment & Plan:

## 2015-03-07 NOTE — Assessment & Plan Note (Signed)
-   stop acei 05/30/2012  - PFT's 07/25/2012  FEV1 2.08 (63%) 77 ratio and no change p B2 ,  DLCO 97 - hfa 90% 08/29/2012  > confirmed 10/29/2012  - allergy profile 12/10/2012 > eos 3.8% with IgE < 5 neg rast - try off singulair 04/29/13     DDX of  difficult airways management all start with A and  include Adherence, Ace Inhibitors, Acid Reflux, Active Sinus Disease, Alpha 1 Antitripsin deficiency, Anxiety masquerading as Airways dz,  ABPA,  allergy(esp in young), Aspiration (esp in elderly), Adverse effects of meds,  Active smokers, A bunch of PE's (a small clot burden can't cause this syndrome unless there is already severe underlying pulm or vascular dz with poor reserve) plus two Bs  = Bronchiectasis and Beta blocker use..and one C= CHF    In this case Adherence is the biggest issue and starts with  inability to use HFA effectively and also  understand that SABA treats the symptoms but doesn't get to the underlying problem (inflammation).  I used  the analogy of putting steroid cream on a rash to help explain the meaning of topical therapy and the need to get the drug to the target tissue.   - The proper method of use, as well as anticipated side effects, of a metered-dose inhaler are discussed and demonstrated to the patient. Improved effectiveness after extensive coaching during this visit to a level of approximately  90% so rechallenge with symbicort 160 2bid   Active smoking the other big concern > see sep a/p  ? Acid (or non-acid) GERD > always difficult to exclude as up to 75% of pts in some series report no assoc GI/ Heartburn symptoms> rec continue max (24h)  acid suppression and diet restrictions/ reviewed     I had an extended discussion with the patient reviewing all relevant studies completed to date and  lasting 25 minutes of a 40 minute visit    Each maintenance medication was reviewed in detail including most importantly the difference between maintenance and prns and under what  circumstances the prns are to be triggered using an action plan format that is not reflected in the computer generated alphabetically organized AVS.    Please see instructions for details which were reviewed in writing and the patient given a copy highlighting the part that I personally wrote and discussed at today's ov.

## 2015-03-07 NOTE — Assessment & Plan Note (Signed)
Complicated by hbp/ prob gerd / ? osa  Body mass index is 34.39  No results found for: TSH   Contributing to gerd tendency/ doe/reviewed the need and the process to achieve and maintain neg calorie balance > defer f/u primary care including intermittently monitoring thyroid status    Although he does have a limited oral airway I did not get a history suggesting that he has significant sleep apnea or hypersomnolence so did not recommend sleep workup at this point> treatment is wt loss.

## 2015-03-07 NOTE — Assessment & Plan Note (Signed)

## 2015-03-16 ENCOUNTER — Encounter: Payer: Self-pay | Admitting: Internal Medicine

## 2015-03-16 ENCOUNTER — Institutional Professional Consult (permissible substitution): Payer: Self-pay | Admitting: Pulmonary Disease

## 2015-03-16 ENCOUNTER — Encounter: Payer: Self-pay | Admitting: *Deleted

## 2015-03-16 ENCOUNTER — Ambulatory Visit (INDEPENDENT_AMBULATORY_CARE_PROVIDER_SITE_OTHER): Payer: BLUE CROSS/BLUE SHIELD | Admitting: Internal Medicine

## 2015-03-16 VITALS — BP 138/80 | HR 82 | Ht 69.0 in | Wt 231.8 lb

## 2015-03-16 DIAGNOSIS — R059 Cough, unspecified: Secondary | ICD-10-CM

## 2015-03-16 DIAGNOSIS — Z23 Encounter for immunization: Secondary | ICD-10-CM | POA: Diagnosis not present

## 2015-03-16 DIAGNOSIS — J4530 Mild persistent asthma, uncomplicated: Secondary | ICD-10-CM

## 2015-03-16 DIAGNOSIS — F17210 Nicotine dependence, cigarettes, uncomplicated: Secondary | ICD-10-CM | POA: Diagnosis not present

## 2015-03-16 DIAGNOSIS — R05 Cough: Secondary | ICD-10-CM

## 2015-03-16 DIAGNOSIS — F17200 Nicotine dependence, unspecified, uncomplicated: Secondary | ICD-10-CM

## 2015-03-16 NOTE — Progress Notes (Signed)
Subjective:    Patient ID: Joseph Harvey, male    DOB: 1956/02/09   MRN: [id number]  Brief patient profile:  16 yobm smoker prev eval by Gene Carteret around 2010 for "allergies" = chronic cough and sob Pos dust, mold rec avoidance and took shots x 53months no help so stopped and on multiple inhalers > not helping so referred himself to pulmonary clinic 05/30/2012 for eval of sob with pfts s airflow obst on rx 07/25/12   History of Present Illness  05/30/2012 1st pulmonary eval on ACEI on multiple inhalers cc sob in spells x 3-4 years, nothing really helps x steroid shots, happens a week at a time and happened last around Christmas 2013 staying on singulair and dulera not really better with proaire/neb.  Assoc with non-productive cough and sense of throat congestion and pnds  rec Stop lisinopril Start benicar 20 mg one daily  Continue dulera and singulair for now  12/10/2012 f/u ov/Jori Frerichs re ? Asthma flare still smoking Chief Complaint  Patient presents with  . Acute Visit    Pt c/o increased SOB x 3 wks- went to UC on 11/16/12 and was given prednisone taper and then again a wk later was given "two shots". He states has not noticed much improvement since then. He also c/o non prod cough and wheezing.   using lots of saba in various forms day > night with only mild improvement in symptoms >>Dulera rx and PPI/Pepcid    07/18/2013 f/u ov/Kawanna Christley re: asthma/ still smoking / no med calendar  Chief Complaint  Patient presents with  . Follow-up    Pt states that his breathing is overall doing well.  He does c/o PND for the past 3 months.     Added xyzal at bedtime (was on Zyrtec)  Still using saba avg hfa twice daily - no purulent sputum >>changed bystolic to bisoprolol and xyzal to zyrtec   08/06/2013 Follow up  MW pt here for est med calendar - pt did not bring any meds with him today.  reports breathing is improved since last ov. Doing breathing exercises that he saw on You Tube. Unfortunately he stopped  his symbicort. Has not used albuterol inhaler. Says he is feeling good with no flare of cough or wheezing.  He was changed from bystolic to bisoprolo 5mg  daily  . Says b/p is good in am but high in evening.  Still has some nasal drip  rec Increase Bisoprolol 5mg  Twice daily   Restart Symbicort 160 2 puffs Twice daily  , rinse after inhaler use.  MUST STOP SMOKING   Follow up with your family doctor for blood pressure  Follow up Dr. Melvyn Novas  In 6 weeks and .As needed   Bring all your meds to next visit.    09/20/2013 f/u ov/Chenoah Mcnally re: chronic asthma / still actively smoking / no meds or med calendar  Chief Complaint  Patient presents with  . Follow-up    Breathing is good. no complaints today  Only using symbicort 160 2bid prn usually before exercise and says never needs saba at all rec No change in medications needed at this point In the event of any respiratory problem immediately increase to symbicort 160 2 every 12 hours   03/06/2015  Acute  ov/Jenna Routzahn re: referred by Dr Redmond Baseman for preop eval  for asthma / ? OSA / still smoking  Chief Complaint  Patient presents with  . Acute Visit    Pt c/o wheezing "on and off". He  has occ cough- prod with brown sputum.  He has had tightness in his throat which he relates to PND.    admits not consistent with symbicort but denies having any symptoms at hs with cough sob or daytime hypersomnolence  rec Finish prednisone (10 day course) Symbicort 160 Take 2 puffs first thing in am and then another 2 puffs about 12 hours later and Stop Breo  Only use your albuterol as a rescue medication  Nexium 40 mg Take 30- 60 min before your first and last meals of the day  GERD  Diet  Bring all meds to each ov   03/16/2015  f/u ov/Aprel Egelhoff re: asthma/ ? Osa/ no meds / off prednisone now x one week s flare of cough or sob  / still smoking / did not bring meds  Chief Complaint  Patient presents with  . SLEEP CONSULT    pt referred by DR. Redmond Baseman for OSA: pt states he  doesnt believe he has any issue with his sleeping he was just referred due to sinus issues. Epworth Score: 5    Not limited by breathing from desired activities  But very sedentary/ easily confused with details of care and not really clear what meds he's actually taking     No obvious day to day or daytime variabilty or assoc cough/  cp or chest tightness, subjective wheeze overt sinus or hb symptoms. No unusual exp hx or h/o childhood pna/ asthma or knowledge of premature birth.  Sleeping ok without nocturnal  or early am exacerbation  of respiratory  c/o's or need for noct saba. Also denies any obvious fluctuation of symptoms with weather or environmental changes or other aggravating or alleviating factors except as outlined above   Current Medications, Allergies, Complete Past Medical History, Past Surgical History, Family History, and Social History were reviewed in [employer] record.  ROS  The following are not active complaints unless bolded sore throat, dysphagia, dental problems, itching, sneezing,  nasal congestion or excess/ purulent secretions, ear ache,   fever, chills, sweats, unintended wt loss, pleuritic or exertional cp, hemoptysis,  orthopnea pnd or leg swelling, presyncope, palpitations, heartburn, abdominal pain, anorexia, nausea, vomiting, diarrhea  or change in bowel or urinary habits, change in stools or urine, dysuria,hematuria,  rash, arthralgias, visual complaints, headache, numbness weakness or ataxia or problems with walking or coordination,  change in mood/affect or memory.                Objective:   Physical Exam  Pleasant amb obese mod hoarse bm nad/ vital signs removed   07/25/2012  Wt  224 >  212 08/29/12 > 10/29/2012  216 > 12/10/2012 220 > 220 12/27/2012 > 02/07/2013  227 > 03/08/2013  222 > 04/19/2013  233 > 232 07/18/2013 >213 08/06/2013 >  229 09/20/2013 > 03/06/2015  233 > 03/16/2015 232   HEENT: upper dentures, lower partial dentures/ mod  nonspecific swelling of bilateral  turbinates, and orophanx is ok but airway is modified M III . Nl external ear canals without cough reflex   NECK :  without JVD/Nodes/TM/ nl carotid upstrokes bilaterally   LUNGS: no acc muscle use,  Exp wheeze much better with plm    CV:  RRR  no s3 or murmur or increase in P2, no edema   ABD:  soft and nontender with nl excursion in the supine position. No bruits or organomegaly, bowel sounds nl  MS:  warm without deformities, calf tenderness, cyanosis or  clubbing  SKIN: warm and dry without lesions        I personally reviewed images and agree with radiology impression as follows:  CTa chest   07/24/14 No evidence of thoracic or abdominal aortic dissection. No significant central pulmonary embolus. No evidence of active pulmonary disease. Diffuse fatty infiltration of the liver. Small bilateral adrenal gland nodules, likely adenomas. No evidence of bowel obstruction.             Assessment & Plan:

## 2015-03-16 NOTE — Progress Notes (Signed)
   Subjective:    Patient ID: Joseph Harvey, male    DOB: [birth year], 59 y.o.   MRN: [id number]  HPI    Review of Systems  Constitutional: Negative for fever and unexpected weight change.  HENT: Positive for sinus pressure, sneezing and sore throat. Negative for congestion, dental problem, ear pain, nosebleeds, postnasal drip, rhinorrhea and trouble swallowing.   Eyes: Negative for redness and itching.  Respiratory: Positive for wheezing. Negative for cough, chest tightness and shortness of breath.   Cardiovascular: Negative for palpitations and leg swelling.  Gastrointestinal: Negative for nausea and vomiting.  Genitourinary: Negative for dysuria.  Musculoskeletal: Negative for joint swelling.  Skin: Negative for rash.  Neurological: Negative for headaches.  Hematological: Does not bruise/bleed easily.  Psychiatric/Behavioral: Negative for dysphoric mood. The patient is not nervous/anxious.        Objective:   Physical Exam        Assessment & Plan:

## 2015-03-16 NOTE — Patient Instructions (Signed)
Your asthma is under good control and you do not clinically have enough hypersomnolence to warrant sleep study so you are cleared for sinus surgery  Change follow up to 2 weeks after sinus surgery with all meds / inhalers in hand

## 2015-03-17 NOTE — Assessment & Plan Note (Signed)
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   UACS is so named because it's frequently impossible to sort out how much is  CR/sinusitis with freq throat clearing (which can be related to primary GERD)   vs  causing  secondary (" extra esophageal")  GERD from wide swings in gastric pressure that occur with throat clearing, often  promoting self use of mint and menthol lozenges that reduce the lower esophageal sphincter tone and exacerbate the problem further in a cyclical fashion.   These are the same pts (now being labeled as having "irritable larynx syndrome" by some cough centers) who not infrequently have a history of having failed to tolerate ace inhibitors,  dry powder inhalers or biphosphonates or report having atypical reflux symptoms that don't respond to standard doses of PPI , and are easily confused as having aecopd or asthma flares by even experienced allergists/ pulmonologists.   For now needs max rex for gerd and address sinus dz per Dr Redmond Baseman, then return with all meds in hand 2 weeks post op to regroup re longterm rx

## 2015-03-17 NOTE — Assessment & Plan Note (Signed)
-  stop acei 05/30/2012  - PFT's 07/25/2012  FEV1 2.08 (63%) 77 ratio and no change p B2 ,  DLCO 97 - hfa 90% 08/29/2012  > confirmed 10/29/2012  - allergy profile 12/10/2012 > eos 3.8% with IgE < 5 neg rast - try off singulair 04/29/13  -med calendar 12/27/2012  - 03/06/2015  extensive coaching HFA effectiveness =   90% > rechallenge with symbicort 160  2 bid  - Spirometry 03/16/2015  FEV1 1.25 (40%) ratio 77 p am symbicort   All goals of chronic asthma control met including optimal function (no residual airflow obst/ just pseudowheeze on exam) and elimination of symptoms with minimal need for rescue therapy.  Contingencies discussed in full including contacting this office immediately if not controlling the symptoms using the rule of two's.     Ok for surgery  I had an extended discussion with the patient reviewing all relevant studies completed to date and  lasting 15 to 20 minutes of a 25 minute visit    Each maintenance medication was reviewed in detail including most importantly the difference between maintenance and prns and under what circumstances the prns are to be triggered using an action plan format that is not reflected in the computer generated alphabetically organized AVS.    Please see instructions for details which were reviewed in writing and the patient given a copy highlighting the part that I personally wrote and discussed at today's ov.

## 2015-03-17 NOTE — Assessment & Plan Note (Signed)
Complicated by hbp/ prob gerd / ? osa and restrictive physiology of spirometry 03/16/2015  Epworth score/ hx not c/w sign osa so ok with me to proceed with surgery   Body mass index is 34.22 trending down sltlyl  No results found for: TSH   Contributing to gerd tendency/ doe/reviewed the need and the process to achieve and maintain neg calorie balance > defer f/u primary care including intermittently monitoring thyroid status

## 2015-03-17 NOTE — Assessment & Plan Note (Signed)

## 2015-03-18 ENCOUNTER — Ambulatory Visit: Payer: Self-pay | Admitting: Internal Medicine

## 2015-03-30 ENCOUNTER — Ambulatory Visit: Payer: Self-pay | Admitting: Internal Medicine

## 2015-05-17 HISTORY — PX: BACK SURGERY: SHX140

## 2015-09-30 DIAGNOSIS — Z800 Family history of malignant neoplasm of digestive organs: Secondary | ICD-10-CM | POA: Insufficient documentation

## 2015-09-30 DIAGNOSIS — K219 Gastro-esophageal reflux disease without esophagitis: Secondary | ICD-10-CM | POA: Insufficient documentation

## 2015-09-30 DIAGNOSIS — Z86010 Personal history of colonic polyps: Secondary | ICD-10-CM | POA: Insufficient documentation

## 2016-05-27 ENCOUNTER — Other Ambulatory Visit: Payer: Self-pay | Admitting: Orthopedic Surgery

## 2016-05-27 ENCOUNTER — Encounter: Payer: Self-pay | Admitting: Gastroenterology

## 2016-05-30 ENCOUNTER — Other Ambulatory Visit: Payer: Self-pay | Admitting: Orthopedic Surgery

## 2016-05-30 DIAGNOSIS — M4726 Other spondylosis with radiculopathy, lumbar region: Secondary | ICD-10-CM

## 2016-06-04 ENCOUNTER — Ambulatory Visit
Admission: RE | Admit: 2016-06-04 | Discharge: 2016-06-04 | Disposition: A | Discharge status: Home / Self Care | Payer: BLUE CROSS/BLUE SHIELD | Source: Ambulatory Visit | Attending: Orthopedic Surgery | Admitting: Orthopedic Surgery

## 2016-06-04 DIAGNOSIS — M4726 Other spondylosis with radiculopathy, lumbar region: Secondary | ICD-10-CM

## 2016-07-11 DIAGNOSIS — N289 Disorder of kidney and ureter, unspecified: Secondary | ICD-10-CM | POA: Insufficient documentation

## 2016-08-10 DIAGNOSIS — E785 Hyperlipidemia, unspecified: Secondary | ICD-10-CM | POA: Insufficient documentation

## 2016-08-18 DIAGNOSIS — E559 Vitamin D deficiency, unspecified: Secondary | ICD-10-CM | POA: Insufficient documentation

## 2017-10-13 DIAGNOSIS — F17210 Nicotine dependence, cigarettes, uncomplicated: Secondary | ICD-10-CM | POA: Insufficient documentation

## 2017-10-13 DIAGNOSIS — Z8546 Personal history of malignant neoplasm of prostate: Secondary | ICD-10-CM | POA: Insufficient documentation

## 2017-10-13 DIAGNOSIS — Z79899 Other long term (current) drug therapy: Secondary | ICD-10-CM | POA: Insufficient documentation

## 2017-10-13 DIAGNOSIS — R072 Precordial pain: Secondary | ICD-10-CM | POA: Insufficient documentation

## 2017-10-13 DIAGNOSIS — Z7984 Long term (current) use of oral hypoglycemic drugs: Secondary | ICD-10-CM | POA: Insufficient documentation

## 2017-10-13 DIAGNOSIS — J45909 Unspecified asthma, uncomplicated: Secondary | ICD-10-CM | POA: Insufficient documentation

## 2017-10-14 ENCOUNTER — Emergency Department (HOSPITAL_COMMUNITY): Payer: Self-pay

## 2017-10-14 ENCOUNTER — Encounter (HOSPITAL_COMMUNITY): Payer: Self-pay

## 2017-10-14 ENCOUNTER — Other Ambulatory Visit: Payer: Self-pay

## 2017-10-14 ENCOUNTER — Emergency Department (HOSPITAL_COMMUNITY)
Admission: EM | Admit: 2017-10-14 | Discharge: 2017-10-14 | Disposition: A | Discharge status: Home / Self Care | Payer: Self-pay | Attending: Emergency Medicine | Admitting: Emergency Medicine

## 2017-10-14 DIAGNOSIS — R072 Precordial pain: Secondary | ICD-10-CM

## 2017-10-14 LAB — BASIC METABOLIC PANEL
Anion gap: 7 (ref 5–15)
BUN: 10 mg/dL (ref 6–20)
CALCIUM: 8.9 mg/dL (ref 8.9–10.3)
CO2: 28 mmol/L (ref 22–32)
CREATININE: 0.98 mg/dL (ref 0.61–1.24)
Chloride: 106 mmol/L (ref 101–111)
GFR calc non Af Amer: >60 mL/min (ref >60)
GLUCOSE: 113 mg/dL — AB (ref 65–99)
Potassium: 3.9 mmol/L (ref 3.5–5.1)
Sodium: 141 mmol/L (ref 135–145)

## 2017-10-14 LAB — CBC
HCT: 41.9 % (ref 39.0–52.0)
Hemoglobin: 14.4 g/dL (ref 13.0–17.0)
MCH: 31.6 pg (ref 26.0–34.0)
MCHC: 34.4 g/dL (ref 30.0–36.0)
MCV: 92.1 fL (ref 78.0–100.0)
PLATELETS: 296 10*3/uL (ref 150–400)
RBC: 4.55 MIL/uL (ref 4.22–5.81)
RDW: 12.5 % (ref 11.5–15.5)
WBC: 5.3 10*3/uL (ref 4.0–10.5)

## 2017-10-14 LAB — I-STAT TROPONIN, ED
TROPONIN I, POC: 0 ng/mL (ref 0.00–0.08)
Troponin i, poc: 0.01 ng/mL (ref 0.00–0.08)

## 2017-10-14 MED ORDER — KETOROLAC TROMETHAMINE 30 MG/ML IJ SOLN
15.0000 mg | Freq: Once | INTRAMUSCULAR | Status: AC
  Administered 2017-10-14: 15 mg via INTRAVENOUS
  Filled 2017-10-14: qty 1

## 2017-10-14 MED ORDER — GI COCKTAIL ~~LOC~~
30.0000 mL | Freq: Once | ORAL | Status: AC
  Administered 2017-10-14: 30 mL via ORAL
  Filled 2017-10-14: qty 30

## 2017-10-14 MED ORDER — HYDROCHLOROTHIAZIDE 12.5 MG PO CAPS
25.0000 mg | ORAL_CAPSULE | Freq: Once | ORAL | Status: AC
  Administered 2017-10-14: 25 mg via ORAL
  Filled 2017-10-14: qty 2

## 2017-10-14 MED ORDER — IBUPROFEN 800 MG PO TABS: 800.0000 mg | ORAL_TABLET | Freq: Three times a day (TID) | ORAL | 0 refills | Status: DC

## 2017-10-14 MED ORDER — HYDROCHLOROTHIAZIDE 25 MG PO TABS: 25.0000 mg | ORAL_TABLET | Freq: Every day | ORAL | 0 refills | Status: DC

## 2017-10-14 NOTE — ED Provider Notes (Signed)
[facility] DEPT Provider Note   CSN: [id number] Arrival date & time: 10/13/17  2353     History   Chief Complaint Chief Complaint  Patient presents with  . Chest Pain    HPI Joseph Harvey is a 62 y.o. male.  The history is provided by the patient.  Chest Pain   This is a new problem. The current episode started more than 2 days ago. The problem occurs constantly. The problem has not changed since onset.The pain is associated with movement and raising an arm. The pain is present in the lateral region. The pain is at a severity of 5/10. The pain is moderate. The quality of the pain is described as dull. The pain does not radiate. Pertinent negatives include no back pain, no cough, no diaphoresis, no dizziness, no fever, no headaches, no numbness, no palpitations, no shortness of breath and no weakness.  Pertinent negatives for past medical history include no seizures.    Past Medical History:  Diagnosis Date  . Allergy    Dust, mold, dust mites  . Asthma   . Cancer [facility])    prostate  . Hyperlipidemia   . Hypertension     Patient Active Problem List   Diagnosis Date Noted  . Obesity, morbid ([place]) 03/07/2015  . Smoker 08/31/2012  . Cough 07/25/2012  . HBP (high blood pressure) 06/01/2012  . Asthma 05/30/2012    Past Surgical History:  Procedure Laterality Date  . KNEE ARTHROSCOPY  2005   right  . ROBOT ASSISTED LAPAROSCOPIC RADICAL PROSTATECTOMY  03/2010        Home Medications    Prior to Admission medications   Medication Sig Start Date End Date Taking? Authorizing Provider  albuterol (PROVENTIL HFA;VENTOLIN HFA) 108 (90 BASE) MCG/ACT inhaler Inhale 2 puffs into the lungs every 4 (four) hours as needed for wheezing or shortness of breath. 06/24/14  Yes Tysinger, Benjamine Mola, NP  albuterol (PROVENTIL) (2.5 MG/3ML) 0.083% nebulizer solution Take 1 vial by mouth every 4 (four) hours as needed for wheezing or shortness of breath (((PLAN  B))).  03/27/11  Yes [provider]  ASPIRIN 81 PO Take 81 mg by mouth daily.   Yes [provider]  Azelastine-Fluticasone (DYMISTA) 137-50 MCG/ACT SUSP Place 1 spray into the nose daily as needed. 04/14/17  Yes [provider]  baclofen (LIORESAL) 10 MG tablet Take 10 mg by mouth daily. 10/14/16  Yes [provider]  Cholecalciferol (VITAMIN D PO) Take 1 tablet by mouth daily.   Yes [provider]  cloNIDine (CATAPRES - DOSED IN MG/24 HR) 0.3 mg/24hr patch Place 0.3 mg onto the skin once a week.   Yes [provider]  clotrimazole-betamethasone (LOTRISONE) cream Apply 1 application topically as needed. 07/12/16  Yes [provider]  diclofenac sodium (VOLTAREN) 1 % GEL Apply 1 application topically daily as needed.   Yes [provider]  Efinaconazole (JUBLIA) 10 % SOLN Apply 1 application topically as needed.   Yes [provider]  esomeprazole (NEXIUM) 40 MG capsule Take 1 capsule by mouth 2 (two) times daily before a meal. 07/31/13  Yes [provider]  fluticasone (FLONASE) 50 MCG/ACT nasal spray Place 1 spray into both nostrils as needed.   Yes [provider]  gabapentin (NEURONTIN) 300 MG capsule Take 300 mg by mouth daily as needed.   Yes [provider]  ipratropium-albuterol (DUONEB) 0.5-2.5 (3) MG/3ML SOLN Take 1 mL by nebulization daily. 04/22/17  Yes [provider]  Luliconazole (LUZU) 1 % CREA Apply 1 application topically 2 (two) times daily. 08/04/16  Yes [provider]  metFORMIN (GLUCOPHAGE-XR) 500 MG 24 hr tablet Take 500 mg by mouth daily.   Yes [provider]  montelukast (SINGULAIR) 10 MG tablet Take 1 tablet by mouth at bedtime. 04/19/14  Yes [provider]  potassium chloride SA (KLOR-CON M20) 20 MEQ tablet Take 20 mEq by mouth daily. 02/23/17  Yes [provider]  predniSONE (DELTASONE) 50 MG tablet Take 50 mg by mouth  daily. 09/23/17  Yes [provider]  rosuvastatin (CRESTOR) 40 MG tablet Take 40 mg by mouth daily. 02/23/17  Yes [provider]  terbinafine (LAMISIL) 250 MG tablet Take 250 mg by mouth daily.   Yes [provider]  triamterene-hydrochlorothiazide (DYAZIDE) 37.5-25 MG capsule Take 1 capsule by mouth daily. 08/28/17  Yes [provider]  budesonide-formoterol (SYMBICORT) 160-4.5 MCG/ACT inhaler Take 2 puffs first thing in am and then another 2 puffs about 12 hours later. Patient not taking: Reported on 10/14/2017 03/06/15   Tanda Rockers, MD  Nebivolol HCl (BYSTOLIC) 20 MG TABS Take 1 tablet by mouth 2 (two) times daily.    [provider]    Family History Family History  Problem Relation Age of Onset  . Colon cancer Father   . Lung cancer Mother        was a smoker  . Lung cancer Father        was a smoker  . Asthma Mother   . Allergies Brother     Social History Social History   Tobacco Use  . Smoking status: Current Every Day Smoker    Packs/day: 0.50    Years: 30.00    Pack years: 15.00    Types: Cigarettes  . Smokeless tobacco: Never Used  Substance Use Topics  . Alcohol use: Yes    Alcohol/week: 1.8 oz    Types: 3 Cans of beer per week    Comment: rarely  . Drug use: No     Allergies   Amoxicillin; Lisinopril; Molds & smuts; and Robitussin [guaifenesin]   Review of Systems Review of Systems  Constitutional: Negative for diaphoresis and fever.  Eyes: Negative for photophobia and visual disturbance.  Respiratory: Negative for cough, shortness of breath and stridor.   Cardiovascular: Positive for chest pain. Negative for palpitations and leg swelling.  Genitourinary: Negative for flank pain.  Musculoskeletal: Negative for back pain and neck pain.  Neurological: Negative for dizziness, tremors, seizures, syncope, facial asymmetry, speech difficulty, weakness, light-headedness, numbness and headaches.  All other  systems reviewed and are negative.    Physical Exam Updated Vital Signs BP (!) 150/113 (BP Location: Right Arm)   Pulse 84   Temp 98.1 F (36.7 C) (Oral)   Resp 20   SpO2 98%   Physical Exam  Constitutional: He is oriented to person, place, and time. He appears well-developed and well-nourished. No distress.  HENT:  Head: Normocephalic and atraumatic.  Nose: Nose normal.  Mouth/Throat: No oropharyngeal exudate.  Eyes: Pupils are equal, round, and reactive to light. Conjunctivae are normal.  Neck: Normal range of motion. Neck supple.  Cardiovascular: Normal rate, regular rhythm, normal heart sounds and intact distal pulses.  No murmur heard. Pulmonary/Chest: Effort normal and breath sounds normal. No stridor. No respiratory distress. He has no wheezes. He has no rales. He exhibits tenderness.  Abdominal: Soft. Bowel sounds are normal. He exhibits no mass. There is  no tenderness. There is no guarding.  Musculoskeletal: Normal range of motion. He exhibits no edema, tenderness or deformity.  Neurological: He is alert and oriented to person, place, and time. He displays normal reflexes.  Skin: Skin is warm and dry. Capillary refill takes less than 2 seconds.  Psychiatric: He has a normal mood and affect.     ED Treatments / Results  Labs (all labs ordered are listed, but only abnormal results are displayed) Results for orders placed or performed during the hospital encounter of 25/95/63  Basic metabolic panel  Result Value Ref Range   Sodium 141 135 - 145 mmol/L   Potassium 3.9 3.5 - 5.1 mmol/L   Chloride 106 101 - 111 mmol/L   CO2 28 22 - 32 mmol/L   Glucose, Bld 113 (H) 65 - 99 mg/dL   BUN 10 6 - 20 mg/dL   Creatinine, Ser 0.98 0.61 - 1.24 mg/dL   Calcium 8.9 8.9 - 10.3 mg/dL   GFR calc non Af Amer >60 >60 mL/min   GFR calc Af Amer >60 >60 mL/min   Anion gap 7 5 - 15  CBC  Result Value Ref Range   WBC 5.3 4.0 - 10.5 K/uL   RBC 4.55 4.22 - 5.81 MIL/uL   Hemoglobin  14.4 13.0 - 17.0 g/dL   HCT 41.9 39.0 - 52.0 %   MCV 92.1 78.0 - 100.0 fL   MCH 31.6 26.0 - 34.0 pg   MCHC 34.4 30.0 - 36.0 g/dL   RDW 12.5 11.5 - 15.5 %   Platelets 296 150 - 400 K/uL  I-stat troponin, ED  Result Value Ref Range   Troponin i, poc 0.00 0.00 - 0.08 ng/mL   Comment 3          I-stat troponin, ED  Result Value Ref Range   Troponin i, poc 0.01 0.00 - 0.08 ng/mL   Comment 3           Dg Chest 2 View  Result Date: 10/14/2017 CLINICAL DATA:  62 y/o M; left-sided chest pain and shortness of breath. EXAM: CHEST - 2 VIEW COMPARISON:  07/24/2014 chest radiograph. FINDINGS: Stable heart size and mediastinal contours are within normal limits. Both lungs are clear. The visualized skeletal structures are unremarkable. IMPRESSION: No active disease. Electronically Signed   By: Kristine Garbe M.D.   On: 10/14/2017 00:17    EKG EKG Interpretation  Date/Time:  Saturday October 14 2017 00:06:33 EDT Ventricular Rate:  84 PR Interval:    QRS Duration: 84 QT Interval:  393 QTC Calculation: 465 R Axis:   73 Text Interpretation:  Sinus rhythm Multiple ventricular premature complexes Confirmed by Dory Horn) on 10/14/2017 4:04:37 AM   Radiology Dg Chest 2 View  Result Date: 10/14/2017 CLINICAL DATA:  62 y/o M; left-sided chest pain and shortness of breath. EXAM: CHEST - 2 VIEW COMPARISON:  07/24/2014 chest radiograph. FINDINGS: Stable heart size and mediastinal contours are within normal limits. Both lungs are clear. The visualized skeletal structures are unremarkable. IMPRESSION: No active disease. Electronically Signed   By: Kristine Garbe M.D.   On: 10/14/2017 00:17    Procedures Procedures (including critical care time)  Medications Ordered in ED Medications  gi cocktail (Maalox,Lidocaine,Donnatal) (30 mLs Oral Given 10/14/17 0435)  ketorolac (TORADOL) 30 MG/ML injection 15 mg (15 mg Intravenous Given 10/14/17 0435)     Pain is reproducible and highly  atypical for cardiac pain.  No SOB, no leg pain or swelling no  long car trips or plane trips.  HEART score is 1 low risk for mace and ruled out for MI in the ED.    Final Clinical Impressions(s) / ED Diagnoses    Return for weakness, numbness, changes in vision or speech, fevers >100.4 unrelieved by medication, shortness of breath, intractable vomiting, or diarrhea, abdominal pain, Inability to tolerate liquids or food, cough, altered mental status or any concerns. No signs of systemic illness or infection. The patient is nontoxic-appearing on exam and vital signs are within normal limits.   I have reviewed the triage vital signs and the nursing notes. Pertinent labs &imaging results that were available during my care of the patient were reviewed by me and considered in my medical decision making (see chart for details).  After history, exam, and medical workup I feel the patient has been appropriately medically screened and is safe for discharge home. Pertinent diagnoses were discussed with the patient. Patient was given return precautions.      Aysiah Jurado, MD 10/14/17 3785

## 2017-10-14 NOTE — ED Triage Notes (Signed)
Pt reports chest pain "for the last few days." He states that the pain is primarily on the left and sometimes radiates down his L arm. It is occasionally accompanied by dizziness and SOB. He states that his blood pressure has been out of control since January d/t finances. A&Ox4.

## 2018-06-20 ENCOUNTER — Other Ambulatory Visit: Payer: Self-pay

## 2018-06-20 ENCOUNTER — Emergency Department (HOSPITAL_BASED_OUTPATIENT_CLINIC_OR_DEPARTMENT_OTHER)
Admission: EM | Admit: 2018-06-20 | Discharge: 2018-06-20 | Disposition: A | Discharge status: Home / Self Care | Payer: Self-pay | Attending: Emergency Medicine | Admitting: Emergency Medicine

## 2018-06-20 ENCOUNTER — Encounter (HOSPITAL_BASED_OUTPATIENT_CLINIC_OR_DEPARTMENT_OTHER): Payer: Self-pay | Admitting: Emergency Medicine

## 2018-06-20 ENCOUNTER — Emergency Department (HOSPITAL_BASED_OUTPATIENT_CLINIC_OR_DEPARTMENT_OTHER): Payer: Self-pay

## 2018-06-20 DIAGNOSIS — Z79899 Other long term (current) drug therapy: Secondary | ICD-10-CM | POA: Insufficient documentation

## 2018-06-20 DIAGNOSIS — Z7982 Long term (current) use of aspirin: Secondary | ICD-10-CM | POA: Insufficient documentation

## 2018-06-20 DIAGNOSIS — F17210 Nicotine dependence, cigarettes, uncomplicated: Secondary | ICD-10-CM | POA: Insufficient documentation

## 2018-06-20 DIAGNOSIS — E119 Type 2 diabetes mellitus without complications: Secondary | ICD-10-CM | POA: Insufficient documentation

## 2018-06-20 DIAGNOSIS — J45909 Unspecified asthma, uncomplicated: Secondary | ICD-10-CM | POA: Insufficient documentation

## 2018-06-20 DIAGNOSIS — Z7984 Long term (current) use of oral hypoglycemic drugs: Secondary | ICD-10-CM | POA: Insufficient documentation

## 2018-06-20 DIAGNOSIS — J0100 Acute maxillary sinusitis, unspecified: Secondary | ICD-10-CM | POA: Insufficient documentation

## 2018-06-20 DIAGNOSIS — I10 Essential (primary) hypertension: Secondary | ICD-10-CM | POA: Insufficient documentation

## 2018-06-20 DIAGNOSIS — R42 Dizziness and giddiness: Secondary | ICD-10-CM | POA: Insufficient documentation

## 2018-06-20 LAB — CBC WITH DIFFERENTIAL/PLATELET
Abs Immature Granulocytes: 0.01 10*3/uL (ref 0.00–0.07)
BASOS ABS: 0 10*3/uL (ref 0.0–0.1)
Basophils Relative: 1 %
EOS ABS: 0.1 10*3/uL (ref 0.0–0.5)
Eosinophils Relative: 1 %
HCT: 48.2 % (ref 39.0–52.0)
Hemoglobin: 16.4 g/dL (ref 13.0–17.0)
Immature Granulocytes: 0 %
Lymphocytes Relative: 43 %
Lymphs Abs: 2.9 10*3/uL (ref 0.7–4.0)
MCH: 31.3 pg (ref 26.0–34.0)
MCHC: 34 g/dL (ref 30.0–36.0)
MCV: 92 fL (ref 80.0–100.0)
MONO ABS: 0.5 10*3/uL (ref 0.1–1.0)
Monocytes Relative: 7 %
Neutro Abs: 3.2 10*3/uL (ref 1.7–7.7)
Neutrophils Relative %: 48 %
Platelets: 332 10*3/uL (ref 150–400)
RBC: 5.24 MIL/uL (ref 4.22–5.81)
RDW: 12.2 % (ref 11.5–15.5)
WBC: 6.7 10*3/uL (ref 4.0–10.5)
nRBC: 0 % (ref 0.0–0.2)

## 2018-06-20 LAB — BASIC METABOLIC PANEL
Anion gap: 6 (ref 5–15)
BUN: 15 mg/dL (ref 8–23)
CHLORIDE: 100 mmol/L (ref 98–111)
CO2: 28 mmol/L (ref 22–32)
Calcium: 9.2 mg/dL (ref 8.9–10.3)
Creatinine, Ser: 1.22 mg/dL (ref 0.61–1.24)
GFR calc Af Amer: >60 mL/min (ref >60)
GFR calc non Af Amer: >60 mL/min (ref >60)
Glucose, Bld: 109 mg/dL — ABNORMAL HIGH (ref 70–99)
Potassium: 3.4 mmol/L — ABNORMAL LOW (ref 3.5–5.1)
Sodium: 134 mmol/L — ABNORMAL LOW (ref 135–145)

## 2018-06-20 LAB — TROPONIN I: Troponin I: <0.03 ng/mL (ref <0.03)

## 2018-06-20 MED ORDER — DEXAMETHASONE SODIUM PHOSPHATE 10 MG/ML IJ SOLN
10.0000 mg | Freq: Once | INTRAMUSCULAR | Status: AC
  Administered 2018-06-20: 10 mg via INTRAVENOUS
  Filled 2018-06-20: qty 1

## 2018-06-20 MED ORDER — SODIUM CHLORIDE 0.9 % IV BOLUS (SEPSIS)
1000.0000 mL | Freq: Once | INTRAVENOUS | Status: AC
  Administered 2018-06-20: 1000 mL via INTRAVENOUS

## 2018-06-20 NOTE — Discharge Instructions (Addendum)
You may use nasal saline and Flonase over-the-counter to help with nasal congestion, sinus pressure.  You do not need antibiotics to treat sinusitis as this is normally caused by a virus.  You have no fever here.  Your blood work was normal today as was your chest x-ray and EKG.  You have been given a dose of IV steroids to help with any inflammation of your sinuses.  Please increase your water intake at home and avoid caffeine and alcohol as these can lead to dehydration.

## 2018-06-20 NOTE — ED Notes (Signed)
Patient transported to X-ray 

## 2018-06-20 NOTE — ED Provider Notes (Signed)
TIME SEEN: 2:37 AM  CHIEF COMPLAINT: "I think I have a sinus infection"  HPI: Patient is a 63 year old male with history of hypertension, hyperlipidemia, diabetes, asthma who presents to the emergency department with complaints of feeling like he has a sinus infection.  States "it feels puffy inside my head".  Has had dry cough and nasal congestion.  No fevers or chills.  States it is caused him to have few starving headache and felt lightheaded and having some vertiginous symptoms mostly with standing up.  Also reports that he has had intermittent left-sided squeezing chest pain for years.  States that he is normally on Bystolic for his blood pressure but states he cannot afford this medication so he is taking metoprolol instead which makes him feel tired and short of breath.  No numbness, tingling or focal weakness.  Able to ambulate.  No vomiting or diarrhea.  No head injury.  ROS: See HPI Constitutional: no fever  Eyes: no drainage  ENT: no runny nose   Cardiovascular:  no chest pain  Resp: no SOB  GI: no vomiting GU: no dysuria Integumentary: no rash  Allergy: no hives  Musculoskeletal: no leg swelling  Neurological: no slurred speech ROS otherwise negative  PAST MEDICAL HISTORY/PAST SURGICAL HISTORY:  Past Medical History:  Diagnosis Date  . Allergy    Dust, mold, dust mites  . Asthma   . Cancer [facility])    prostate  . Hyperlipidemia   . Hypertension     MEDICATIONS:  Prior to Admission medications   Medication Sig Start Date End Date Taking? Authorizing Provider  albuterol (PROVENTIL HFA;VENTOLIN HFA) 108 (90 BASE) MCG/ACT inhaler Inhale 2 puffs into the lungs every 4 (four) hours as needed for wheezing or shortness of breath. 06/24/14   Britt Bottom, NP  albuterol (PROVENTIL) (2.5 MG/3ML) 0.083% nebulizer solution Take 1 vial by mouth every 4 (four) hours as needed for wheezing or shortness of breath (((PLAN B))).  03/27/11   [provider]  ASPIRIN 81 PO Take  81 mg by mouth daily.    [provider]  Azelastine-Fluticasone (DYMISTA) 137-50 MCG/ACT SUSP Place 1 spray into the nose daily as needed. 04/14/17   [provider]  baclofen (LIORESAL) 10 MG tablet Take 10 mg by mouth daily. 10/14/16   [provider]  budesonide-formoterol (SYMBICORT) 160-4.5 MCG/ACT inhaler Take 2 puffs first thing in am and then another 2 puffs about 12 hours later. Patient not taking: Reported on 10/14/2017 03/06/15   Tanda Rockers, MD  Cholecalciferol (VITAMIN D PO) Take 1 tablet by mouth daily.    [provider]  cloNIDine (CATAPRES - DOSED IN MG/24 HR) 0.3 mg/24hr patch Place 0.3 mg onto the skin once a week.    [provider]  clotrimazole-betamethasone (LOTRISONE) cream Apply 1 application topically as needed. 07/12/16   [provider]  diclofenac sodium (VOLTAREN) 1 % GEL Apply 1 application topically daily as needed.    [provider]  Efinaconazole (JUBLIA) 10 % SOLN Apply 1 application topically as needed.    [provider]  esomeprazole (NEXIUM) 40 MG capsule Take 1 capsule by mouth 2 (two) times daily before a meal. 07/31/13   [provider]  fluticasone (FLONASE) 50 MCG/ACT nasal spray Place 1 spray into both nostrils as needed.    [provider]  gabapentin (NEURONTIN) 300 MG capsule Take 300 mg by mouth daily as needed.    [provider]  hydrochlorothiazide (HYDRODIURIL) 25 MG tablet  Take 1 tablet (25 mg total) by mouth daily. 10/14/17   Palumbo, April, MD  ibuprofen (ADVIL,MOTRIN) 800 MG tablet Take 1 tablet (800 mg total) by mouth 3 (three) times daily. 10/14/17   Palumbo, April, MD  ipratropium-albuterol (DUONEB) 0.5-2.5 (3) MG/3ML SOLN Take 1 mL by nebulization daily. 04/22/17   [provider]  Luliconazole (LUZU) 1 % CREA Apply 1 application topically 2 (two) times daily. 08/04/16   [provider]  metFORMIN (GLUCOPHAGE-XR) 500 MG 24 hr  tablet Take 500 mg by mouth daily.    [provider]  montelukast (SINGULAIR) 10 MG tablet Take 1 tablet by mouth at bedtime. 04/19/14   [provider]  Nebivolol HCl (BYSTOLIC) 20 MG TABS Take 1 tablet by mouth 2 (two) times daily.    [provider]  potassium chloride SA (KLOR-CON M20) 20 MEQ tablet Take 20 mEq by mouth daily. 02/23/17   [provider]  predniSONE (DELTASONE) 50 MG tablet Take 50 mg by mouth daily. 09/23/17   [provider]  rosuvastatin (CRESTOR) 40 MG tablet Take 40 mg by mouth daily. 02/23/17   [provider]  terbinafine (LAMISIL) 250 MG tablet Take 250 mg by mouth daily.    [provider]  triamterene-hydrochlorothiazide (DYAZIDE) 37.5-25 MG capsule Take 1 capsule by mouth daily. 08/28/17   [provider]    ALLERGIES:  Allergies  Allergen Reactions  . Amoxicillin Anaphylaxis    Has patient had a PCN reaction causing immediate rash, facial/tongue/throat swelling, SOB or lightheadedness with hypotension: Yes Has patient had a PCN reaction causing severe rash involving mucus membranes or skin necrosis: No Has patient had a PCN reaction that required hospitalization: Yes Has patient had a PCN reaction occurring within the last 10 years: Yes If all of the above answers are "NO", then may proceed with Cephalosporin use.   Marland Kitchen Lisinopril Shortness Of Breath  . Molds & Smuts Anaphylaxis  . Robitussin [Guaifenesin] Shortness Of Breath    wheezing    SOCIAL HISTORY:  Social History   Tobacco Use  . Smoking status: Current Every Day Smoker    Packs/day: 0.50    Years: 30.00    Pack years: 15.00    Types: Cigarettes  . Smokeless tobacco: Never Used  Substance Use Topics  . Alcohol use: Yes    Alcohol/week: 3.0 standard drinks    Types: 3 Cans of beer per week    Comment: rarely    FAMILY HISTORY: Family History  Problem Relation Age of Onset  . Colon cancer Father   . Lung cancer  Father        was a smoker  . Lung cancer Mother        was a smoker  . Asthma Mother   . Allergies Brother     EXAM: BP (!) 149/93 (BP Location: Left Arm)   Pulse 86   Temp 98.1 F (36.7 C) (Oral)   Resp 18   Ht 5\' 9"  (1.753 m)   Wt 101.6 kg   SpO2 99%   BMI 33.08 kg/m  CONSTITUTIONAL: Alert and oriented and responds appropriately to questions. Well-appearing; well-nourished HEAD: Normocephalic EYES: Conjunctivae clear, pupils appear equal, EOMI ENT: normal nose; moist mucous membranes; No pharyngeal erythema or petechiae, no tonsillar hypertrophy or exudate, no uvular deviation, no unilateral swelling, no trismus or drooling, no muffled voice, normal phonation, no stridor, no dental caries present, no drainable dental abscess noted, no Ludwig's angina, tongue sits flat in the  bottom of the mouth, no angioedema, no facial erythema or warmth, no facial swelling; no pain with movement of the neck.  He is tender over the maxillary sinuses bilaterally.  TMs are clear bilaterally without erythema, purulence, bulging, perforation, effusion.  No cerumen impaction or sign of foreign body in the external auditory canal. No inflammation, erythema or drainage from the external auditory canal. No signs of mastoiditis. No pain with manipulation of the pinna bilaterally. NECK: Supple, no meningismus, no nuchal rigidity, no LAD  CARD: RRR; S1 and S2 appreciated; no murmurs, no clicks, no rubs, no gallops RESP: Normal chest excursion without splinting or tachypnea; breath sounds clear and equal bilaterally; no wheezes, no rhonchi, no rales, no hypoxia or respiratory distress, speaking full sentences ABD/GI: Normal bowel sounds; non-distended; soft, non-tender, no rebound, no guarding, no peritoneal signs, no hepatosplenomegaly BACK:  The back appears normal and is non-tender to palpation, there is no CVA tenderness EXT: Normal ROM in all joints; non-tender to palpation; no edema; normal capillary  refill; no cyanosis, no calf tenderness or swelling    SKIN: Normal color for age and race; warm; no rash NEURO: Moves all extremities equally, sensation to light touch intact diffusely, cranial nerves II through XII intact, normal speech, normal gait, no ataxia PSYCH: The patient's mood and manner are appropriate. Grooming and personal hygiene are appropriate.  MEDICAL DECISION MAKING: Patient here with symptoms of sinusitis.  Patient requesting antibiotic treatment repeatedly.  Discussed with patient that likely viral illness, allergic in nature and that he does not need antibiotics.  He is not febrile.  There is no sign of facial cellulitis.  No sign of otitis media, otitis externa, mastoiditis, pharyngitis.  Doubt meningitis.  Does complain of intermittent chest pain for the past several years.  No chest pain currently.  States he has had shortness of breath but associates this with metoprolol that he started taking recently.  Will obtain labs to ensure no anemia, electrolyte abnormality.  Will check troponin although chest pain seems atypical.  Will also obtain chest x-ray given complaints of intermittent shortness of breath although this may be related to his beta-blocker.  EKG shows sinus rhythm without ischemic changes.  He does have frequent PVCs.  Will treat symptoms with IV fluids, Decadron.  Doubt stroke.  He is neurologically intact here.  ED PROGRESS: Patient reports feeling better.  Labs unremarkable.  Normal hemoglobin, electrolytes, negative troponin.  Blood glucose normal.  Chest x-ray clear.  Again patient has asked for antibiotics.  States "I will have insurance so I do not want to have to go back to another doctor".  Discussed with patient again at length that I do not feel antibiotics are indicated.  He is afebrile without leukocytosis and symptoms have only been present for 3 days.  I have recommended nasal saline over-the-counter as well as over-the-counter nasal steroids.  Recommended  increase fluid intake.  He has been given a dose of IV steroids here for symptomatic relief.  I feel he is safe to be discharged home.  Patient verbalized understanding.   At this time, I do not feel there is any life-threatening condition present. I have reviewed and discussed all results (EKG, imaging, lab, urine as appropriate) and exam findings with patient/family. I have reviewed nursing notes and appropriate previous records.  I feel the patient is safe to be discharged home without further emergent workup and can continue workup as an outpatient as needed. Discussed usual and customary return precautions. Patient/family verbalize  understanding and are comfortable with this plan.  Outpatient follow-up has been provided as needed. All questions have been answered.     EKG Interpretation  Date/Time:  Wednesday June 20 2018 02:47:44 EST Ventricular Rate:  84 PR Interval:    QRS Duration: 83 QT Interval:  392 QTC Calculation: 464 R Axis:   83 Text Interpretation:  Sinus rhythm Multiple ventricular premature complexes Left atrial enlargement Anteroseptal infarct, age indeterminate Borderline ST elevation, inferior leads Baseline wander in lead(s) III aVL aVF No significant change since last tracing Confirmed by Davene Jobin, Cyril Mourning [phone]) on 06/20/2018 3:01:14 AM          Deren Degrazia, Delice Bison, DO 06/20/18 1444

## 2018-06-20 NOTE — ED Notes (Signed)
PT c/o dizziness when he gets up  Pt c/o headaches  Pt thinks he may have a sinus infection

## 2018-06-20 NOTE — ED Triage Notes (Signed)
Pt c/o cough and congestion. Also c/o dizziness and feeling off balance x 3 days.

## 2018-07-19 ENCOUNTER — Encounter: Payer: Self-pay | Admitting: Internal Medicine

## 2018-07-19 ENCOUNTER — Telehealth: Payer: Self-pay | Admitting: Physician Assistant

## 2018-07-19 ENCOUNTER — Ambulatory Visit: Payer: Self-pay | Attending: Internal Medicine | Admitting: Internal Medicine

## 2018-07-19 ENCOUNTER — Ambulatory Visit (HOSPITAL_BASED_OUTPATIENT_CLINIC_OR_DEPARTMENT_OTHER)
Admission: RE | Admit: 2018-07-19 | Discharge: 2018-07-19 | Disposition: A | Discharge status: Home / Self Care | Payer: Self-pay | Source: Ambulatory Visit | Attending: Internal Medicine | Admitting: Internal Medicine

## 2018-07-19 VITALS — BP 166/120 | HR 87 | Temp 98.1°F | Resp 16 | Ht 69.0 in | Wt 228.8 lb

## 2018-07-19 DIAGNOSIS — J4540 Moderate persistent asthma, uncomplicated: Secondary | ICD-10-CM

## 2018-07-19 DIAGNOSIS — Z8709 Personal history of other diseases of the respiratory system: Secondary | ICD-10-CM | POA: Insufficient documentation

## 2018-07-19 DIAGNOSIS — F17210 Nicotine dependence, cigarettes, uncomplicated: Secondary | ICD-10-CM

## 2018-07-19 DIAGNOSIS — I10 Essential (primary) hypertension: Secondary | ICD-10-CM

## 2018-07-19 DIAGNOSIS — R42 Dizziness and giddiness: Secondary | ICD-10-CM

## 2018-07-19 DIAGNOSIS — F17200 Nicotine dependence, unspecified, uncomplicated: Secondary | ICD-10-CM

## 2018-07-19 DIAGNOSIS — E119 Type 2 diabetes mellitus without complications: Secondary | ICD-10-CM

## 2018-07-19 LAB — POCT GLYCOSYLATED HEMOGLOBIN (HGB A1C): HbA1c, POC (prediabetic range): 6 % (ref 5.7–6.4)

## 2018-07-19 LAB — GLUCOSE, POCT (MANUAL RESULT ENTRY): POC Glucose: 88 mg/dl (ref 70–99)

## 2018-07-19 MED ORDER — LORATADINE 10 MG PO TABS: 10.0000 mg | ORAL_TABLET | Freq: Every day | ORAL | 3 refills | Status: DC

## 2018-07-19 MED ORDER — BUDESONIDE-FORMOTEROL FUMARATE 160-4.5 MCG/ACT IN AERO: INHALATION_SPRAY | RESPIRATORY_TRACT | 11 refills | Status: DC

## 2018-07-19 MED ORDER — AMLODIPINE BESYLATE 5 MG PO TABS: 5.0000 mg | ORAL_TABLET | Freq: Every day | ORAL | 3 refills | Status: DC

## 2018-07-19 MED FILL — !SYMBICORT 160-4.5 MCG INH: 160-4.5 | 30 days supply | Qty: 1 | Fill #0

## 2018-07-19 MED FILL — AMLODIPINE BESYLATE 5 MG TA: 5 | 30 days supply | Qty: 30 | Fill #0

## 2018-07-19 NOTE — Progress Notes (Signed)
Patient ID: Joseph Harvey, male    DOB: 1956-03-15  MRN: [id number]  CC: New Patient (Initial Visit); Diabetes; and Hypertension   Subjective: Joseph Harvey is a 63 y.o. male who presents for new patient visit His concerns today include:  Patient with history of HTN, HL, DM type II, tobacco dependence, GERD, mild asthma, vitamin D deficiency, CKD 2, prostate CA  Previous PCP was at [facility] Dr. Karle Starch.  Other providers were ophthalmologist Dr. Elsie Lincoln, Endocrinologist Dr. Posey Pronto and nephrologist Dr. Olivia Mackie all at [facility].  No longer has insurance Pt does not have his meds with him  Tob:  1 pk Q 2 days.  Smoked for 43 yrs.  Would like to quit but difficult to do so  DM:  Takes Metformin only when he takes Prednisone for flare of asthma Checks BS occasionally.  Does well with eating habits. Not exercising as much as he should in past 3 mths.  Has treadmill at home.  Use to go to park to walk a few times a wk and does have a treadmill at home  Main concern today is ongoing issue with sinuses and dizziness. Seen in ER 1 mth ago with nasal congestion and vertigo.  Pt thought he has sinus infection.  However, no abx given.  Told to use OTC nasal saline and nasal steroid spray.  Seen at [facility] the following day and prescribed Amoxil.  Seen again 10 days later and given Doxycycline for sinus infection Still feels LT nostril is congested and LT side of head feels clogged. + dizzy feel if he lays on his LT side and if he stands up to quickly or leans forward.  CBC done 06/2018 was nl. -endorses a lot of drainage at back of throat. -nasal mucous brown before abx but lighter now Uses Flonase 2-3 x a day and Netti pot regularly.  Feels good for a few hrs after he rinses with Netti pot but symptoms return  Jaw was frx yrs ago 2016 and surgery on jaw and ? Nostril recommended but surgery cancel due to uncontrol asthma at the time   Was seeing Dr. Melvyn Novas several yrs ago. ? Of  UACS/pseudoasthma -Suppose to be on Symbicort but not taking consistently due to cost.  Asthma flares in weather extremes  HTN: On Bystolic, Triam/HCTZ.  Clonidine pill to use PRN.  He does not recall the dose of the Clonidine  Fhx, surg and Soc hx reviewed and some hx reviewed on Care Everywhere Patient Active Problem List   Diagnosis Date Noted  . Obesity, morbid ([place]) 03/07/2015  . Smoker 08/31/2012  . Cough 07/25/2012  . HBP (high blood pressure) 06/01/2012  . Asthma 05/30/2012     Current Outpatient Medications on File Prior to Visit  Medication Sig Dispense Refill  . albuterol (PROVENTIL HFA;VENTOLIN HFA) 108 (90 BASE) MCG/ACT inhaler Inhale 2 puffs into the lungs every 4 (four) hours as needed for wheezing or shortness of breath. 1 Inhaler 3  . albuterol (PROVENTIL) (2.5 MG/3ML) 0.083% nebulizer solution Take 1 vial by mouth every 4 (four) hours as needed for wheezing or shortness of breath (((PLAN B))).     . ASPIRIN 81 PO Take 81 mg by mouth daily.    . baclofen (LIORESAL) 10 MG tablet Take 10 mg by mouth daily.    . Cholecalciferol (VITAMIN D PO) Take 1 tablet by mouth daily.    . cloNIDine (CATAPRES - DOSED IN MG/24 HR) 0.3 mg/24hr patch Place 0.3 mg onto the  skin once a week.    . clotrimazole-betamethasone (LOTRISONE) cream Apply 1 application topically as needed.    . diclofenac sodium (VOLTAREN) 1 % GEL Apply 1 application topically daily as needed.    . Efinaconazole (JUBLIA) 10 % SOLN Apply 1 application topically as needed.    Marland Kitchen esomeprazole (NEXIUM) 40 MG capsule Take 1 capsule by mouth 2 (two) times daily before a meal.    . fluticasone (FLONASE) 50 MCG/ACT nasal spray Place 1 spray into both nostrils as needed.    . gabapentin (NEURONTIN) 300 MG capsule Take 300 mg by mouth daily as needed.    Marland Kitchen ibuprofen (ADVIL,MOTRIN) 800 MG tablet Take 1 tablet (800 mg total) by mouth 3 (three) times daily. (Patient not taking: Reported on 07/19/2018) 21 tablet 0  .  ipratropium-albuterol (DUONEB) 0.5-2.5 (3) MG/3ML SOLN Take 1 mL by nebulization daily.    . Luliconazole (LUZU) 1 % CREA Apply 1 application topically 2 (two) times daily.    . metFORMIN (GLUCOPHAGE-XR) 500 MG 24 hr tablet Take 500 mg by mouth daily.    . montelukast (SINGULAIR) 10 MG tablet Take 1 tablet by mouth at bedtime.  0  . Nebivolol HCl (BYSTOLIC) 20 MG TABS Take 1 tablet by mouth 2 (two) times daily.    . potassium chloride SA (KLOR-CON M20) 20 MEQ tablet Take 20 mEq by mouth daily.    . rosuvastatin (CRESTOR) 40 MG tablet Take 40 mg by mouth daily.    Marland Kitchen terbinafine (LAMISIL) 250 MG tablet Take 250 mg by mouth daily.    Marland Kitchen triamterene-hydrochlorothiazide (DYAZIDE) 37.5-25 MG capsule Take 1 capsule by mouth daily.     No current facility-administered medications on file prior to visit.     Allergies  Allergen Reactions  . Amoxicillin Anaphylaxis    Has patient had a PCN reaction causing immediate rash, facial/tongue/throat swelling, SOB or lightheadedness with hypotension: Yes Has patient had a PCN reaction causing severe rash involving mucus membranes or skin necrosis: No Has patient had a PCN reaction that required hospitalization: Yes Has patient had a PCN reaction occurring within the last 10 years: Yes If all of the above answers are "NO", then may proceed with Cephalosporin use.   Marland Kitchen Lisinopril Shortness Of Breath  . Molds & Smuts Anaphylaxis  . Robitussin [Guaifenesin] Shortness Of Breath    wheezing    Social History   Socioeconomic History  . Marital status: Divorced    Spouse name: Not on file  . Number of children: 2  . Years of education: 12 grade  . Highest education level: Not on file  Occupational History  . Occupation: International aid/development worker: [employer] ([place])  Social Needs  . Financial resource strain: Not on file  . Food insecurity:    Worry: Not on file    Inability: Not on file  . Transportation needs:    Medical: Not on file    Non-medical: Not on  file  Tobacco Use  . Smoking status: Current Every Day Smoker    Packs/day: 0.50    Years: 30.00    Pack years: 15.00    Types: Cigarettes  . Smokeless tobacco: Never Used  Substance and Sexual Activity  . Alcohol use: Yes    Alcohol/week: 3.0 standard drinks    Types: 3 Cans of beer per week    Comment: rarely  . Drug use: No  . Sexual activity: Not on file  Lifestyle  . Physical activity:  Days per week: Not on file    Minutes per session: Not on file  . Stress: Not on file  Relationships  . Social connections:    Talks on phone: Not on file    Gets together: Not on file    Attends religious service: Not on file    Active member of club or organization: Not on file    Attends meetings of clubs or organizations: Not on file    Relationship status: Not on file  . Intimate partner violence:    Fear of current or ex partner: Not on file    Emotionally abused: Not on file    Physically abused: Not on file    Forced sexual activity: Not on file  Other Topics Concern  . Not on file  Social History Narrative  . Not on file    Family History  Problem Relation Age of Onset  . Colon cancer Father   . Lung cancer Father        was a smoker  . Prostate cancer Father   . Lung cancer Mother        was a smoker  . Asthma Mother   . Allergies Brother   . Diabetes Maternal Grandmother     Past Surgical History:  Procedure Laterality Date  . CATARACT EXTRACTION Bilateral   . KNEE ARTHROSCOPY  2005   right  . PROSTATE SURGERY    . ROBOT ASSISTED LAPAROSCOPIC RADICAL PROSTATECTOMY  03/2010    ROS: Review of Systems Negative except as stated above  PHYSICAL EXAM: BP (!) 166/120   Pulse 87   Temp 98.1 F (36.7 C) (Oral)   Resp 16   Ht 5\' 9"  (1.753 m)   Wt 228 lb 12.8 oz (103.8 kg)   SpO2 95%   BMI 33.79 kg/m   Wt Readings from Last 3 Encounters:  07/19/18 228 lb 12.8 oz (103.8 kg)  06/20/18 224 lb (101.6 kg)  03/16/15 231 lb 12.8 oz (105.1 kg)   Sitting  BP 149/90, P 81, Standing BP 169/108, P 84 Physical Exam General appearance - alert, well appearing, older African-American male and in no distress Mental status - normal mood, behavior, speech, dress, motor activity, and thought processes Ears - bilateral TM's and external ear canals normal Nose -dry mucosa.  Mild enlargement of nasal turbinates.  Nasal passage much smaller on left compared to the right Mouth - mucous membranes moist, pharynx normal without lesions Neck - supple, no significant adenopathy Chest - clear to auscultation, no wheezes, rales or rhonchi, symmetric air entry Heart - normal rate, regular rhythm, normal S1, S2, no murmurs, rubs, clicks or gallops Neurological - cranial nerves II through XII intact, motor and sensory grossly normal bilaterally, Romberg sign negative, normal gait and station Extremities -no lower extremity edema   CMP Latest Ref Rng & Units 06/20/2018 10/14/2017 07/24/2014  Glucose 70 - 99 mg/dL 109(H) 113(H) 97  BUN 8 - 23 mg/dL 15 10 21   Creatinine 0.61 - 1.24 mg/dL 1.22 0.98 1.12  Sodium 135 - 145 mmol/L 134(L) 141 135  Potassium 3.5 - 5.1 mmol/L 3.4(L) 3.9 3.3(L)  Chloride 98 - 111 mmol/L 100 106 100  CO2 22 - 32 mmol/L 28 28 27   Calcium 8.9 - 10.3 mg/dL 9.2 8.9 8.9   Lipid Panel  No results found for: CHOL, TRIG, HDL, CHOLHDL, VLDL, LDLCALC, LDLDIRECT  CBC    Component Value Date/Time   WBC 6.7 06/20/2018 0309   RBC 5.24 06/20/2018  0309   HGB 16.4 06/20/2018 0309   HCT 48.2 06/20/2018 0309   PLT 332 06/20/2018 0309   MCV 92.0 06/20/2018 0309   MCH 31.3 06/20/2018 0309   MCHC 34.0 06/20/2018 0309   RDW 12.2 06/20/2018 0309   LYMPHSABS 2.9 06/20/2018 0309   MONOABS 0.5 06/20/2018 0309   EOSABS 0.1 06/20/2018 0309   BASOSABS 0.0 06/20/2018 0309    ASSESSMENT AND PLAN:  1. History of paranasal sinus congestion Patient will continue using the Nettie pot as needed. Advised to use the Flonase nasal spray as needed as continuous use  can cause nosebleeds. Add Claritin. We will get sinus films. Apply for the orange card so that we can refer to ENT. - loratadine (CLARITIN) 10 MG tablet; Take 1 tablet (10 mg total) by mouth daily.  Dispense: 30 tablet; Refill: 3 - DG Sinuses Complete; Future  2. Dizziness May be related to sinuses are completely different process.  I advised to go slow with position changes  3. Moderate persistent asthma, unspecified whether complicated Refill Symbicort which she can get through our pharmacy and hopefully the patient assistance program since he is uninsured.  Encourage smoking cessation - budesonide-formoterol (SYMBICORT) 160-4.5 MCG/ACT inhaler; Take 2 puffs first thing in am and then another 2 puffs about 12 hours later.  Dispense: 1 Inhaler; Refill: 11 - loratadine (CLARITIN) 10 MG tablet; Take 1 tablet (10 mg total) by mouth daily.  Dispense: 30 tablet; Refill: 3  4. Essential hypertension Uncontrolled.  Add amlodipine 5 mg daily.  DASH diet discussed and encourage - amLODipine (NORVASC) 5 MG tablet; Take 1 tablet (5 mg total) by mouth daily.  Dispense: 30 tablet; Refill: 3  5. Tobacco dependence Advised smoking cessation.  Went over health risks associated with smoking.  Less than 5 minutes spent on counseling  6. Controlled type 2 diabetes mellitus without complication, without long-term current use of insulin ([place]) Dietary counseling given.  Encouraged him to stay active. - POCT glucose (manual entry) - POCT glycosylated hemoglobin (Hb A1C) - Microalbumin / creatinine urine ratio    Patient was given the opportunity to ask questions.  Patient verbalized understanding of the plan and was able to repeat key elements of the plan.   Orders Placed This Encounter  Procedures  . DG Sinuses Complete  . Microalbumin / creatinine urine ratio  . POCT glucose (manual entry)  . POCT glycosylated hemoglobin (Hb A1C)     Requested Prescriptions   Signed Prescriptions Disp Refills   . budesonide-formoterol (SYMBICORT) 160-4.5 MCG/ACT inhaler 1 Inhaler 11    Sig: Take 2 puffs first thing in am and then another 2 puffs about 12 hours later.  . loratadine (CLARITIN) 10 MG tablet 30 tablet 3    Sig: Take 1 tablet (10 mg total) by mouth daily.  Marland Kitchen amLODipine (NORVASC) 5 MG tablet 30 tablet 3    Sig: Take 1 tablet (5 mg total) by mouth daily.    No follow-ups on file.  Karle Plumber, MD, FACP

## 2018-07-19 NOTE — Patient Instructions (Signed)
Go to the radiology department at [facility] to have x-rays done on the sinuses as discussed.  Use the Flonase nasal spray as needed.  I would avoid using it every day as it can cause nosebleeds.  Take Claritin daily.  I have refilled your Symbicort inhaler.  Your blood pressure is not at goal of 130/80 or lower.  We have added a blood pressure medication called amlodipine 5 mg daily.  Continue to limit salt in the foods as much as possible.  Please apply for the orange card/cone discount card so that we can refer you to an ear nose and throat specialist.

## 2018-07-19 NOTE — Progress Notes (Signed)
cbg- 88 a1c- 6.0  Pt states he has been having sinus drainage for the past 3 weeks  Pt states the drainage is worse when he lays down and its all on the left side  Pt states his left nostril is clogged  Pt states he has been having dizzy spells since the drainage has been going on

## 2018-07-20 DIAGNOSIS — E119 Type 2 diabetes mellitus without complications: Secondary | ICD-10-CM | POA: Insufficient documentation

## 2018-07-20 DIAGNOSIS — F17200 Nicotine dependence, unspecified, uncomplicated: Secondary | ICD-10-CM | POA: Insufficient documentation

## 2018-07-20 DIAGNOSIS — Z8709 Personal history of other diseases of the respiratory system: Secondary | ICD-10-CM | POA: Insufficient documentation

## 2018-07-20 LAB — MICROALBUMIN / CREATININE URINE RATIO
Creatinine, Urine: 107.4 mg/dL
Microalb/Creat Ratio: <3 mg/g creat (ref 0–29)
Microalbumin, Urine: <3 ug/mL

## 2018-07-27 ENCOUNTER — Telehealth: Payer: Self-pay

## 2018-07-27 NOTE — Telephone Encounter (Signed)
Contacted pt to go over xray and urine results pt didn't answer and was unable to lvm due to "call can not be completed at this time"

## 2018-07-30 MED FILL — BYSTOLIC 20 MG TABLET: 20 | 30 days supply | Qty: 30 | Fill #0

## 2018-07-30 MED FILL — ALBUTEROL SUL 2.5 MG/3 ML S: (2.5 MG/3ML | 6 days supply | Qty: 75 | Fill #0

## 2018-07-30 MED FILL — predniSONE 10 MG TABS: 10 | 7 days supply | Qty: 35 | Fill #0

## 2018-07-30 MED FILL — cloNIDine HCL 0.1 MG TABS: 0.1 | 30 days supply | Qty: 60 | Fill #0

## 2018-07-30 MED FILL — FLUTICASONE PROP 50 MCG SPR: 50 | 30 days supply | Qty: 16 | Fill #0

## 2018-07-30 MED FILL — IPRAT-ALBUT 0.5-3(2.5) MG/3: 0.5-2.5 (3) | 7 days supply | Qty: 90 | Fill #0

## 2018-07-30 MED FILL — !VENTOLIN HFA INHALER: 108 (90 BAS | 16 days supply | Qty: 18 | Fill #0

## 2018-08-13 ENCOUNTER — Other Ambulatory Visit: Payer: Self-pay | Admitting: Internal Medicine

## 2018-08-13 NOTE — Telephone Encounter (Signed)
1) Medication(s) Requested (by name): metFORMIN (GLUCOPHAGE-XR) 500 MG 24 hr tablet  2) Pharmacy of Choice: [facility] 3) Special Requests: Verified address for med delivery  [address] [place] [address]  Approved medications will be sent to the pharmacy, we will reach out if there is an issue.  Requests made after 3pm may not be addressed until the following business day!  If a patient is unsure of the name of the medication(s) please note and ask patient to call back when they are able to provide all info, do not send to responsible party until all information is available!

## 2018-08-15 MED ORDER — METFORMIN HCL ER 500 MG PO TB24: 500.0000 mg | ORAL_TABLET | Freq: Every day | ORAL | 2 refills | Status: DC

## 2018-08-15 MED FILL — !SYMBICORT 160-4.5 MCG INH: 160-4.5 | 30 days supply | Qty: 1 | Fill #1

## 2018-08-30 MED FILL — ALBUTEROL SUL 2.5 MG/3 ML S: (2.5 MG/3ML | 15 days supply | Qty: 180 | Fill #0

## 2018-08-30 MED FILL — !VENTOLIN HFA INHALER: 108 (90 BAS | 16 days supply | Qty: 18 | Fill #1

## 2018-08-30 MED FILL — FLUTICASONE PROP 50 MCG SPR: 50 | 30 days supply | Qty: 16 | Fill #1

## 2018-09-13 MED FILL — !SYMBICORT 160-4.5 MCG INH: 160-4.5 | 30 days supply | Qty: 1 | Fill #2

## 2018-09-14 MED FILL — ALBUTEROL SUL 2.5 MG/3 ML S: (2.5 MG/3ML | 15 days supply | Qty: 180 | Fill #1

## 2018-09-17 ENCOUNTER — Ambulatory Visit: Payer: Self-pay

## 2018-09-17 ENCOUNTER — Other Ambulatory Visit: Payer: Self-pay

## 2018-09-17 ENCOUNTER — Ambulatory Visit: Payer: Self-pay | Attending: Internal Medicine | Admitting: Internal Medicine

## 2018-09-17 DIAGNOSIS — Z8709 Personal history of other diseases of the respiratory system: Secondary | ICD-10-CM

## 2018-09-17 DIAGNOSIS — L731 Pseudofolliculitis barbae: Secondary | ICD-10-CM

## 2018-09-17 DIAGNOSIS — Z8546 Personal history of malignant neoplasm of prostate: Secondary | ICD-10-CM

## 2018-09-17 DIAGNOSIS — K5900 Constipation, unspecified: Secondary | ICD-10-CM

## 2018-09-17 DIAGNOSIS — J4540 Moderate persistent asthma, uncomplicated: Secondary | ICD-10-CM

## 2018-09-17 DIAGNOSIS — E785 Hyperlipidemia, unspecified: Secondary | ICD-10-CM

## 2018-09-17 DIAGNOSIS — E1169 Type 2 diabetes mellitus with other specified complication: Secondary | ICD-10-CM | POA: Insufficient documentation

## 2018-09-17 DIAGNOSIS — I10 Essential (primary) hypertension: Secondary | ICD-10-CM

## 2018-09-17 MED ORDER — ROSUVASTATIN CALCIUM 20 MG PO TABS: 20.0000 mg | ORAL_TABLET | Freq: Every day | ORAL | 3 refills | Status: DC

## 2018-09-17 MED FILL — ?ROSUVASTATIN CALCIUM 20MG: 20 | 30 days supply | Qty: 30 | Fill #0

## 2018-09-17 NOTE — Progress Notes (Signed)
Pt states he is not taking Norvasc because it made his bp stay high  Per Lurena Joiner Bystolic hasn't bee filled since July 30, 2018

## 2018-09-17 NOTE — Progress Notes (Signed)
Virtual Visit via Telephone Note Due to current restrictions/limitations of in-office visits due to the COVID-19 pandemic, this scheduled clinical appointment was converted to a telehealth visit  I connected with Joseph Harvey on 09/17/18 at 10:44 a.m EDT by telephone  from my office and verified that I am speaking with the correct person using two identifiers.  Patient is in his car.  Only the patient and myself participated in this encounter.   I discussed the limitations, risks, security and privacy concerns of performing an evaluation and management service by telephone and the availability of in person appointments. I also discussed with the patient that there may be a patient responsible charge related to this service. The patient expressed understanding and agreed to proceed.   History of Present Illness: Patient with history of HTN, HL, DM type II, tobacco dependence, GERD, mod persistent asthma, vitamin D deficiency, CKD 2, prostate CA.  Last seen 07/2018   Asthma:  Pt reports he gets flare up of asthma every wk.  Using neb machine 3-4/wk during the nights.  Has Atrovent and Albuterol neb but uses mainly the Albuterol.  Uses albuterol inhaler 1-2 x a day and Symbicort BID.  Taking Prednisone 50 mg intermittent for 1 day once a wk or Q 2 wks -feels he still has on going issues with LT sinus.  Using Neti pot a lot.  Sinus X-rays done 07/2018 revealed no evidence of sinus opacification, air-fluid levels or mucosal thickening.  He called ENT and was told $500 to be seen.  Has completed form for [facility] discount but no appt with financial couselor here as yet; appts on hold due to COVID outbreak  HTN:  He stopped Norvasc because he felt it made his BP higher He was running low on Bystolic so he was taking it just once a day.  He has received a 90-day supply recently so started taking Bystolic consistently 2 wks ago. Taking Triam/HCTZ daily.Takes Clonidine PRN Gives BP range 116-128/75-80s  Limits  salt in foods    Constipation:  Has to drink prune juice or Milk of Mag to keep BM regular.  Wants to know if it is okay to do that.  No blood in stools  DM:  Stripes are out dated.  Checks BS once a wk to once a mth.  Takes metformin only when he is on prednisone.  Last A1c was 6.  HL:  Needs RF on Crestor.  Due to limited finances he was out of it for 5-6 mths  Complains of having an in-grown hair bump on chin x 2 yrs.  Try to pop it sometimes and gets a little drainage.  To note he can do to get rid of it completely.   Would like to get his PSA level checked.  He has history of prostate CA. Observations/Objective:  Last A1C 6.0 Assessment and Plan: 1. Moderate persistent asthma, unspecified whether complicated 2. History of paranasal sinus congestion -pt to continue Symbicort and Albuterol.  Prednisone PRN.  Will refer to Dr. Joya Gaskins for help with management and get him better controlled. Strong advise smoking cessation  3. Essential hypertension Reported home blood pressure readings at goal.  Encourage her to continue current medications and low-salt diet.  Amlodipine taken off med list.  4. Hyperlipidemia associated with type 2 diabetes mellitus ([place]) We will restart Crestor at a lower dose.  Prescription sent to pharmacy - rosuvastatin (CRESTOR) 20 MG tablet; Take 1 tablet (20 mg total) by mouth daily.  Dispense: 30  tablet; Refill: 3  5. History of prostate cancer Patient to come to the lab to have PSA level drawn - PSA; Future  6. Constipation, unspecified constipation type Advised that prune juice may elevate blood sugars.  I recommend using MiraLAX over-the-counter instead.  Once he is approved for orange card/cone discount we can refer for colonoscopy for colon cancer screening.  7. Ingrown hair Will take a look at this on his next office visit   Follow Up Instructions: F/u in 3 mths  I discussed the assessment and treatment plan with the patient. The patient was  provided an opportunity to ask questions and all were answered. The patient agreed with the plan and demonstrated an understanding of the instructions.   The patient was advised to call back or seek an in-person evaluation if the symptoms worsen or if the condition fails to improve as anticipated.  I provided 20 minutes of non-face-to-face time during this encounter.   Karle Plumber, MD

## 2018-09-18 ENCOUNTER — Telehealth: Payer: Self-pay

## 2018-09-18 LAB — PSA: Prostate Specific Ag, Serum: 0.1 ng/mL (ref 0.0–4.0)

## 2018-09-18 NOTE — Telephone Encounter (Signed)
Contacted pt to go over lab results pt is aware and doesn't have any questions or concerns 

## 2018-09-24 NOTE — Progress Notes (Signed)
Subjective:    Patient ID: Joseph Harvey, male    DOB: 1956/01/15, 63 y.o.   MRN: [id number]  63 y.o.F referral for asthma from PCP Dr Wynetta Emery  Patient with history of HTN, HL, DM type II, tobacco dependence, GERD, mod persistent asthma, vitamin D deficiency, CKD 2, prostate CA.    This patient has had longstanding asthma and has been previously assessed between 20 14-20 16 by Dr. Legrand Como worth of pulmonary medicine.  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  Also the patient had ongoing tobacco use and without complete smoking cessation it was difficult to completely alleviate all the patient's symptom complex.  The patient has been taking prednisone 50 mg intermittently 1 day a week or every 2 weeks for the past several years.  Also the patient is using saline rinse and the sinuses and he has had previous films done of his sinuses most recently showing no evidence of sinus opacification or sinusitis.  Since he lost his insurance ear nose and throat will not see him back and previous attempts to do sinus surgery were canceled because of question of his asthma control.  Note the patient does have access to Bystolic for his blood pressure.  The patient also has access to Symbicort for which she is taking 2 inhalations twice daily.  Patient also uses either albuterol or albuterol plus ipratropium and a nebulizer as needed.  He also has a hand-held albuterol.  He is not using his Flonase regularly and he has significant reflux and heartburn daily but is not taking a reflux medication at this time.  Last pulmonary functions in 2016 showed a restrictive defect.  Note the patient is still smoking 1/2 pack a day  of cigarettes.  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  He does relate that he has been exposed to mold in his apartment previously.  He had testing for mold several years ago and was positive.  He took allergy shots for period of time but did not think it was of any benefit.  Note he was still smoking and also still being exposed to mold during that time.  Now he is driving a cab part-time basis.  The patient states his nose gets clogged frequently.  If he carries a heavy load he becomes very short of breath.  The patient does get sinusitis he states doxycycline will improve him.  Please review asthma assessment below  Asthma  He complains of chest tightness, cough, difficulty breathing, frequent throat clearing, shortness of breath, sputum production and wheezing. There is no hemoptysis or hoarse voice. Primary symptoms comments: Sinus cleareing . This is a chronic problem. The current episode started more than 1 year ago. The problem occurs 2 to 4 times per day. The problem has been unchanged. The cough is productive of sputum. Associated symptoms include dyspnea on exertion, ear congestion, ear pain, headaches, heartburn, PND, postnasal drip, rhinorrhea, sneezing and a sore throat. Pertinent negatives include no chest pain or fever. His symptoms are aggravated by pollen, exposure to fumes, lying down, URI, occupational exposure, exercise, any activity and emotional stress. His past medical history is significant for asthma, bronchitis and pneumonia.  Review of Systems  Constitutional: Negative for fever.  HENT: Positive for ear pain, postnasal drip, rhinorrhea, sneezing and sore throat. Negative for hoarse voice.   Respiratory: Positive for cough, sputum production, shortness of breath and wheezing. Negative for hemoptysis.   Cardiovascular: Positive for dyspnea on exertion and PND. Negative for chest pain.  Gastrointestinal: Positive for  heartburn.  Neurological: Positive for headaches.       Objective:   Physical Exam Vitals:   09/25/18 0917  BP: (!) 143/96  Pulse: 83  Resp: 19  Temp: 98.7 F (37.1 C)  TempSrc: Oral  SpO2: 96%  Weight: 237 lb (107.5 kg)  Height: 5\' 9"  (1.753 m)    Gen: Pleasant, well-nourished, in no distress,  normal affect  ENT: Significant bilateral turbinate edema left worse than right without purulence,  mouth clear,  oropharynx clear, 2+ postnasal drip  Neck: No JVD, no TMG, no carotid bruits  Lungs: No use of accessory muscles, no dullness to percussion, inspiratory and expiratory wheeze with poor airflow  Cardiovascular: RRR, heart sounds normal, no murmur or gallops, no peripheral edema  Abdomen: soft and NT, no HSM,  BS normal  Musculoskeletal: No deformities, no cyanosis or clubbing  Neuro: alert, non focal  Skin: Warm, no lesions or rashes   Dr Selinda Orion evaluations from Asthma overview: - stop acei 05/30/2012  - PFT's 07/25/2012  FEV1 2.08 (63%) 77 ratio and no change p B2 ,  DLCO 97 - hfa 90% 08/29/2012  > confirmed 10/29/2012  - allergy profile 12/10/2012 > eos 3.8% with IgE < 5 neg rast - try off singulair 04/29/13  -med calendar 12/27/2012  - 03/06/2015  extensive coaching HFA effectiveness =   90% > rechallenge with symbicort 160  2 bid  - Spirometry 03/16/2015  FEV1 1.25 (40%) ratio 77 p am symbicort   Dr Melvyn Novas last assessement of cough 2016: He appears to have  Significant  Upper airway cough syndrome/ pseudoasthma at present.   UACS is so named because it's frequently impossible to sort out how much is  CR/sinusitis with freq throat clearing (which can be related to primary GERD)   vs  causing  secondary (" extra esophageal")  GERD from wide swings in gastric pressure that occur with throat clearing, often  promoting self use of mint and menthol lozenges that reduce the lower esophageal sphincter tone and exacerbate the problem further in a cyclical fashion.   These  are the same pts (now being labeled as having "irritable larynx syndrome" by some cough centers) who not infrequently have a history of having failed to tolerate ace inhibitors,  dry powder inhalers or biphosphonates or report having atypical reflux symptoms that don't respond to standard doses of PPI , and are easily confused as having aecopd or asthma flares by even experienced allergists/ pulmonologists.   For now needs max rex for gerd   06/2018 CXR: NAD 07/2018 sinus film:  Neg 02/2015 Spiro:  FeV1 40%  FeV1/FVC 77% restrictive defect       Assessment & Plan:  I personally reviewed all images and lab data in the [facility] system as well as any outside material available during this office visit and agree with the  radiology impressions.   Asthma, severe persistent Severe persistent asthma with associated upper airway instability chronic sinusitis significant reflux disease and multiple atopic factors all triggers for this patient's symptom complex  The patient is steroid-dependent and has been taking high-dose prednisone on a weekly basis  Plan  will be to re-test for IgE and mold assay Plan will also be to continue Symbicort and I reinstructed the patient as to its proper use The patient was also given Protonix 40 mg daily for reflux and a reflux diet was issued to the patient  At some point repeat pulmonary function testing will be indicated once the COVID restrictions are released  Smoking cessation is critical for this patient's asthma control to improve and 10 minutes of smoking cessation counseling was given to the patient along with a prescription for nicotine lozenges 4 mg 3 times daily as needed  A Depo-Medrol injection 80 mg was given today as well  GERD (gastroesophageal reflux disease) Gastroesophageal reflux disease was playing a significant role  Cough Cyclical cough likely on the basis of upper and lower airway inflammation and reflux  Tobacco dependence Ongoing  tobacco use  Allergic rhinitis Have also asked this patient to begin Flonase 2 sprays each nostril daily   [place] was seen today for asthma.  Diagnoses and all orders for this visit:  Moderate persistent asthma with acute exacerbation -     IgE -     Allergen Profile, Mold -     methylPREDNISolone acetate (DEPO-MEDROL) injection 40 mg -     methylPREDNISolone acetate (DEPO-MEDROL) injection 40 mg  Tobacco dependence  Severe persistent asthma with acute exacerbation  Gastroesophageal reflux disease without esophagitis  Cough  Non-seasonal allergic rhinitis due to pollen  Other orders -     nicotine polacrilex (NICOTINE MINI) 4 MG lozenge; Take 1 lozenge (4 mg total) by mouth as needed for smoking cessation. -     pantoprazole (PROTONIX) 40 MG tablet; Take 1 tablet (40 mg total) by mouth daily. -     fluticasone (FLONASE) 50 MCG/ACT nasal spray; Place 2 sprays into both nostrils daily.    I had an extended discussion with the patient and or family lasting 10 minutes of a 25 minute visit including: Smoking cessation counseling and reflux diet choices

## 2018-09-25 ENCOUNTER — Ambulatory Visit: Payer: Self-pay | Attending: Critical Care Medicine | Admitting: Critical Care Medicine

## 2018-09-25 ENCOUNTER — Encounter: Payer: Self-pay | Admitting: Critical Care Medicine

## 2018-09-25 ENCOUNTER — Other Ambulatory Visit: Payer: Self-pay

## 2018-09-25 VITALS — BP 143/96 | HR 83 | Temp 98.7°F | Resp 19 | Ht 69.0 in | Wt 237.0 lb

## 2018-09-25 DIAGNOSIS — M545 Low back pain, unspecified: Secondary | ICD-10-CM | POA: Insufficient documentation

## 2018-09-25 DIAGNOSIS — R05 Cough: Secondary | ICD-10-CM

## 2018-09-25 DIAGNOSIS — J301 Allergic rhinitis due to pollen: Secondary | ICD-10-CM

## 2018-09-25 DIAGNOSIS — K219 Gastro-esophageal reflux disease without esophagitis: Secondary | ICD-10-CM

## 2018-09-25 DIAGNOSIS — M5416 Radiculopathy, lumbar region: Secondary | ICD-10-CM | POA: Insufficient documentation

## 2018-09-25 DIAGNOSIS — R059 Cough, unspecified: Secondary | ICD-10-CM

## 2018-09-25 DIAGNOSIS — J4541 Moderate persistent asthma with (acute) exacerbation: Secondary | ICD-10-CM

## 2018-09-25 DIAGNOSIS — F17200 Nicotine dependence, unspecified, uncomplicated: Secondary | ICD-10-CM

## 2018-09-25 DIAGNOSIS — J4551 Severe persistent asthma with (acute) exacerbation: Secondary | ICD-10-CM

## 2018-09-25 MED ORDER — FLUTICASONE PROPIONATE 50 MCG/ACT NA SUSP: 2.0000 | Freq: Every day | NASAL | Status: DC

## 2018-09-25 MED ORDER — NICOTINE POLACRILEX 4 MG MT LOZG: 4.0000 mg | LOZENGE | OROMUCOSAL | 0 refills | Status: DC | PRN

## 2018-09-25 MED ORDER — METHYLPREDNISOLONE ACETATE 40 MG/ML IJ SUSP
40.0000 mg | Freq: Once | INTRAMUSCULAR | Status: AC
  Administered 2018-09-25 (×2): 40 mg via INTRAMUSCULAR

## 2018-09-25 MED ORDER — METHYLPREDNISOLONE ACETATE 40 MG/ML IJ SUSP: 40.0000 mg | Freq: Once | INTRAMUSCULAR | Status: DC

## 2018-09-25 MED ORDER — PANTOPRAZOLE SODIUM 40 MG PO TBEC: 40.0000 mg | DELAYED_RELEASE_TABLET | Freq: Every day | ORAL | 3 refills | Status: DC

## 2018-09-25 MED FILL — SM NICOTINE 4 MG LOZENGE: 4 | 25 days supply | Qty: 100 | Fill #0

## 2018-09-25 MED FILL — ?PANTOPRAZOLE SOD DR 40MGTA: 40 | 30 days supply | Qty: 30 | Fill #0

## 2018-09-25 NOTE — Assessment & Plan Note (Signed)
Gastroesophageal reflux disease was playing a significant role

## 2018-09-25 NOTE — Assessment & Plan Note (Signed)
Ongoing tobacco use

## 2018-09-25 NOTE — Patient Instructions (Addendum)
A Depo-Medrol injection 80 mg was given today  Increase Flonase to 2 sprays each nostril daily  Work on slowing your inhalation down with the Symbicort  Stop Nexium and begin pantoprazole 40 mg daily take 1/2-hour before breakfast and then eat  Follow a reflux diet as below  Labs today included a mold panel and IgE assay  Focus on smoking cessation by using nicotine lozenge 4 mg 2-3 times daily  Return with Dr. Joya Gaskins with a telemetry visit in 2 weeks   Food Choices for Gastroesophageal Reflux Disease, Adult When you have gastroesophageal reflux disease (GERD), the foods you eat and your eating habits are very important. Choosing the right foods can help ease your discomfort. Think about working with a nutrition specialist (dietitian) to help you make good choices. What are tips for following this plan?  Meals  Choose healthy foods that are low in fat, such as fruits, vegetables, whole grains, low-fat dairy products, and lean meat, fish, and poultry.  Eat small meals often instead of 3 large meals a day. Eat your meals slowly, and in a place where you are relaxed. Avoid bending over or lying down until 2-3 hours after eating.  Avoid eating meals 2-3 hours before bed.  Avoid drinking a lot of liquid with meals.  Cook foods using methods other than frying. Bake, grill, or broil food instead.  Avoid or limit: ? Chocolate. ? Peppermint or spearmint. ? Alcohol. ? Pepper. ? Black and decaffeinated coffee. ? Black and decaffeinated tea. ? Bubbly (carbonated) soft drinks. ? Caffeinated energy drinks and soft drinks.  Limit high-fat foods such as: ? Fatty meat or fried foods. ? Whole milk, cream, butter, or ice cream. ? Nuts and nut butters. ? Pastries, donuts, and sweets made with butter or shortening.  Avoid foods that cause symptoms. These foods may be different for everyone. Common foods that cause symptoms include: ? Tomatoes. ? Oranges, lemons, and  limes. ? Peppers. ? Spicy food. ? Onions and garlic. ? Vinegar. Lifestyle  Maintain a healthy weight. Ask your doctor what weight is healthy for you. If you need to lose weight, work with your doctor to do so safely.  Exercise for at least 30 minutes for 5 or more days each week, or as told by your doctor.  Wear loose-fitting clothes.  Do not smoke. If you need help quitting, ask your doctor.  Sleep with the head of your bed higher than your feet. Use a wedge under the mattress or blocks under the bed frame to raise the head of the bed. Summary  When you have gastroesophageal reflux disease (GERD), food and lifestyle choices are very important in easing your symptoms.  Eat small meals often instead of 3 large meals a day. Eat your meals slowly, and in a place where you are relaxed.  Limit high-fat foods such as fatty meat or fried foods.  Avoid bending over or lying down until 2-3 hours after eating.  Avoid peppermint and spearmint, caffeine, alcohol, and chocolate. This information is not intended to replace advice given to you by your health care provider. Make sure you discuss any questions you have with your health care provider. Document Released: 11/01/2011 Document Revised: 06/07/2016 Document Reviewed: 06/07/2016 Elsevier Interactive Patient Education  2019 [employer].

## 2018-09-25 NOTE — Assessment & Plan Note (Signed)
Cyclical cough likely on the basis of upper and lower airway inflammation and reflux

## 2018-09-25 NOTE — Assessment & Plan Note (Signed)
Have also asked this patient to begin Flonase 2 sprays each nostril daily

## 2018-09-25 NOTE — Assessment & Plan Note (Signed)
Severe persistent asthma with associated upper airway instability chronic sinusitis significant reflux disease and multiple atopic factors all triggers for this patient's symptom complex  The patient is steroid-dependent and has been taking high-dose prednisone on a weekly basis  Plan will be to re-test for IgE and mold assay Plan will also be to continue Symbicort and I reinstructed the patient as to its proper use The patient was also given Protonix 40 mg daily for reflux and a reflux diet was issued to the patient  At some point repeat pulmonary function testing will be indicated once the COVID restrictions are released  Smoking cessation is critical for this patient's asthma control to improve and 10 minutes of smoking cessation counseling was given to the patient along with a prescription for nicotine lozenges 4 mg 3 times daily as needed  A Depo-Medrol injection 80 mg was given today as well

## 2018-09-27 LAB — ALLERGEN PROFILE, MOLD
Alternaria Alternata IgE: <0.1 kU/L
Aspergillus Fumigatus IgE: <0.1 kU/L
Aureobasidi Pullulans IgE: <0.1 kU/L
Candida Albicans IgE: <0.1 kU/L
Cladosporium Herbarum IgE: <0.1 kU/L
M009-IgE Fusarium proliferatum: <0.1 kU/L
M014-IgE Epicoccum purpur: <0.1 kU/L
Mucor Racemosus IgE: <0.1 kU/L
Penicillium Chrysogen IgE: <0.1 kU/L
Phoma Betae IgE: <0.1 kU/L
Setomelanomma Rostrat: <0.1 kU/L
Stemphylium Herbarum IgE: <0.1 kU/L

## 2018-09-27 LAB — IGE: IgE (Immunoglobulin E), Serum: 2 IU/mL — ABNORMAL LOW (ref 6–495)

## 2018-09-28 ENCOUNTER — Telehealth: Payer: Self-pay | Admitting: Emergency Medicine

## 2018-09-28 NOTE — Telephone Encounter (Signed)
Patient contacted via phone to be given results of labs.  Patient identified by name and date of birth.  Patient given results of labs.  Patient educated on lab results. Questions answered. Patient acknowledged understanding of labs results. 

## 2018-10-05 MED FILL — FLUTICASONE PROP 50 MCG SPR: 50 | 30 days supply | Qty: 16 | Fill #2

## 2018-10-05 MED FILL — !VENTOLIN HFA INHALER: 108 (90 BAS | 16 days supply | Qty: 18 | Fill #2

## 2018-10-05 MED FILL — ALBUTEROL SUL 2.5 MG/3 ML S: (2.5 MG/3ML | 15 days supply | Qty: 180 | Fill #2

## 2018-10-05 MED FILL — IPRAT-ALBUT 0.5-3(2.5) MG/3: 0.5-2.5 (3) | 7 days supply | Qty: 90 | Fill #1

## 2018-10-07 NOTE — Progress Notes (Signed)
Patient ID: Joseph Harvey, male   DOB: 04-May-1956, 63 y.o.   MRN: [id number] Virtual Visit via Telephone Note  I connected with Joseph Harvey on 10/09/18 at  9:30 AM EDT by telephone and verified that I am speaking with the correct person using two identifiers.   Consent:  I discussed the limitations, risks, security and privacy concerns of performing an evaluation and management service by telephone and the availability of in person appointments. I also discussed with the patient that there may be a patient responsible charge related to this service. The patient expressed understanding and agreed to proceed.  Location of patient: At home  Location of provider: In my office  Persons participating in the televisit with the patient.   No one else was with the patient at the time    History of Present Illness: This is a 63 year old male with severe persistent asthma and reflux disease here in follow-up.  At the last visit we ordered allergy mold testing which was negative.  Patient states he is still dyspneic and now his symptoms are much more severe when his reflux is less well controlled.  He now has acid heartburn throughout the day at night.  He is only on once daily proton pump inhibitor.  He states he has postnasal drainage and mucus which causes upper airway issues. The patient continues to smoke a pack a day of cigarettes.   Pos in BOLD Constitutional:   No  weight loss, night sweats,  Fevers, chills, fatigue, lassitude. HEENT:   No headaches,  Difficulty swallowing,  Tooth/dental problems,  Sore throat,                No sneezing, itching, ear ache, nasal congestion, post nasal drip,   CV:  No chest pain,  Orthopnea, PND, swelling in lower extremities, anasarca, dizziness, palpitations  GI   heartburn, indigestion, abdominal pain, nausea, vomiting, diarrhea, change in bowel habits, loss of appetite  Resp:  shortness of breath with exertion or at rest.   excess mucus,  productive  cough,   non-productive cough,  No coughing up of blood.  No change in color of mucus.  No wheezing.  No chest wall deformity  Skin: no rash or lesions.  GU: no dysuria, change in color of urine, no urgency or frequency.  No flank pain.  MS:  No joint pain or swelling.  No decreased range of motion.  No back pain.  Psych:  No change in mood or affect. No depression or anxiety.  No memory loss.  Observations/Objective: No observations this was a telephone visit  Assessment and Plan: Asthma, severe persistent Severe persistent asthma with associated upper airway instability chronic sinusitis significant reflux disease and multiple atopic factors all triggers for this patient's symptom complex Plan will be for this patient to increase Protonix to twice daily and schedule ipratropium albuterol by nebulizer twice daily in addition to Advair and Spiriva  Smoking cessation is critical for success in this patient  Note pulmonary function studies previously showed severe obstructive defects  GERD (gastroesophageal reflux disease) Gastroesophageal reflux disease was playing a significant role  Plan is to increase Protonix to twice daily  Cough Cyclical cough likely on the basis of upper and lower airway inflammation and reflux    Tobacco dependence Ongoing tobacco use  I represcribed nicotine lozenge 4 mg 3 times daily  Allergic rhinitis The patient will continue Flonase 2 sprays twice daily in addition to loratadine  Follow Up Instructions: The patient knows  will be an in office visit in 2 months   I discussed the assessment and treatment plan with the patient. The patient was provided an opportunity to ask questions and all were answered. The patient agreed with the plan and demonstrated an understanding of the instructions.   The patient was advised to call back or seek an in-person evaluation if the symptoms worsen or if the condition fails to improve as anticipated.  I  provided 25 minutes of non-face-to-face time during this encounter  including  median intraservice time , review of notes, labs, imaging, medications  and explaining diagnosis and management to the patient .    [name], MD

## 2018-10-09 ENCOUNTER — Ambulatory Visit: Payer: Self-pay | Attending: Critical Care Medicine | Admitting: Critical Care Medicine

## 2018-10-09 ENCOUNTER — Encounter: Payer: Self-pay | Admitting: Critical Care Medicine

## 2018-10-09 ENCOUNTER — Other Ambulatory Visit: Payer: Self-pay

## 2018-10-09 DIAGNOSIS — J4550 Severe persistent asthma, uncomplicated: Secondary | ICD-10-CM

## 2018-10-09 DIAGNOSIS — F17200 Nicotine dependence, unspecified, uncomplicated: Secondary | ICD-10-CM

## 2018-10-09 DIAGNOSIS — R05 Cough: Secondary | ICD-10-CM

## 2018-10-09 DIAGNOSIS — J301 Allergic rhinitis due to pollen: Secondary | ICD-10-CM

## 2018-10-09 DIAGNOSIS — K219 Gastro-esophageal reflux disease without esophagitis: Secondary | ICD-10-CM

## 2018-10-09 DIAGNOSIS — R059 Cough, unspecified: Secondary | ICD-10-CM

## 2018-10-09 DIAGNOSIS — Z8709 Personal history of other diseases of the respiratory system: Secondary | ICD-10-CM

## 2018-10-09 MED ORDER — IPRATROPIUM-ALBUTEROL 0.5-2.5 (3) MG/3ML IN SOLN: 1.0000 mL | Freq: Two times a day (BID) | RESPIRATORY_TRACT | 3 refills | Status: DC

## 2018-10-09 MED ORDER — ALBUTEROL SULFATE (2.5 MG/3ML) 0.083% IN NEBU: 2.5000 mg | INHALATION_SOLUTION | RESPIRATORY_TRACT | 3 refills | Status: DC | PRN

## 2018-10-09 MED ORDER — PANTOPRAZOLE SODIUM 40 MG PO TBEC: 40.0000 mg | DELAYED_RELEASE_TABLET | Freq: Two times a day (BID) | ORAL | 3 refills | Status: DC

## 2018-10-09 MED ORDER — MONTELUKAST SODIUM 10 MG PO TABS: 10.0000 mg | ORAL_TABLET | Freq: Every day | ORAL | 3 refills | Status: DC

## 2018-10-09 MED FILL — ALBUTEROL 0.083% INHAL SOLN: (2.5 MG/3ML | 4 days supply | Qty: 75 | Fill #0

## 2018-10-09 MED FILL — MONTELUKAST SOD 10 MG TAB: 10 | 30 days supply | Qty: 30 | Fill #0

## 2018-10-09 MED FILL — IPRAT-ALBUT 0.5-3(2.5) MG/3: 0.5-2.5 (3) | 30 days supply | Qty: 90 | Fill #0

## 2018-10-09 NOTE — Progress Notes (Signed)
Per pt the heart burn med, can he take BID. Also his Albuterol can he gets more then 1 or 2 at a time so he don't have to keep coming back, his mold test came back negative and this is a f/u  Per pt he wants to stop smoking but he don't know if he can right now.   Nasal drainage, mucus gets stuck in his throat.

## 2018-10-15 MED FILL — ?TRIAMTERENE/HCTZ 37.5/25TB: 37.5-25 | 30 days supply | Qty: 30 | Fill #0

## 2018-10-15 MED FILL — ?PANTOPRAZOLE SODI DR 40MGT: 40 | 30 days supply | Qty: 60 | Fill #0

## 2018-10-15 MED FILL — ?ROSUVASTATIN CALCIUM 20MG: 20 | 30 days supply | Qty: 30 | Fill #1

## 2018-10-22 ENCOUNTER — Ambulatory Visit: Payer: Self-pay | Attending: Internal Medicine

## 2018-10-22 ENCOUNTER — Other Ambulatory Visit: Payer: Self-pay

## 2018-11-05 MED FILL — IPRAT-ALBUT 0.5-3(2.5) MG/3: 0.5-2.5 (3) | 30 days supply | Qty: 90 | Fill #1

## 2018-11-05 MED FILL — predniSONE 10 MG TABS: 10 | 7 days supply | Qty: 35 | Fill #1

## 2018-11-05 MED FILL — ALBUTEROL 0.083% INHAL SOLN: (2.5 MG/3ML | 4 days supply | Qty: 75 | Fill #1

## 2018-11-05 MED FILL — MONTELUKAST SOD 10 MG TAB: 10 | 30 days supply | Qty: 30 | Fill #1

## 2018-11-05 MED FILL — FLUTICASONE PROP 50 MCG SPR: 50 | 16 days supply | Qty: 16 | Fill #3

## 2018-11-06 ENCOUNTER — Other Ambulatory Visit: Payer: Self-pay | Admitting: *Deleted

## 2018-11-06 DIAGNOSIS — Z20822 Contact with and (suspected) exposure to covid-19: Secondary | ICD-10-CM

## 2018-11-08 LAB — NOVEL CORONAVIRUS, NAA: SARS-CoV-2, NAA: NOT DETECTED

## 2018-11-15 MED FILL — ?PANTOPRAZOLE SO DR 40MG TA: 40 | 30 days supply | Qty: 60 | Fill #1

## 2018-11-15 MED FILL — IPRAT-ALBUT 0.5-3(2.5) MG/3: 0.5-2.5 (3) | 7 days supply | Qty: 90 | Fill #2

## 2018-11-15 MED FILL — ?ROSUVASTATIN CALCIUM 20MG: 20 | 30 days supply | Qty: 30 | Fill #2

## 2018-11-15 MED FILL — ?TRIAMTERENE/HCTZ 37.5/25TB: 37.5-25 | 30 days supply | Qty: 30 | Fill #1

## 2018-11-21 MED FILL — BYSTOLIC 20 MG TABLET: 20 | 30 days supply | Qty: 30 | Fill #1

## 2018-12-06 MED FILL — MONTELUKAST SOD 10 MG TAB: 10 | 30 days supply | Qty: 30 | Fill #2

## 2018-12-06 MED FILL — $VENTOLIN HFA 18G INHALER: 108 (90 BAS | 25 days supply | Qty: 18 | Fill #3

## 2018-12-06 MED FILL — METFORMIN HCL ER 500 MG TB2: 500 | 30 days supply | Qty: 30 | Fill #0

## 2018-12-06 MED FILL — IPRAT-ALBUT 0.5-3(2.5) MG/3: 0.5-2.5 (3) | 7 days supply | Qty: 90 | Fill #3

## 2018-12-09 NOTE — Progress Notes (Deleted)
Patient ID: Joseph Harvey, male   DOB: 1955/07/20, 63 y.o.   MRN: [id number] Virtual Visit via Telephone Note  I connected with Joseph Harvey on 12/09/18 at  8:30 AM EDT by telephone and verified that I am speaking with the correct person using two identifiers.   Consent:  I discussed the limitations, risks, security and privacy concerns of performing an evaluation and management service by telephone and the availability of in person appointments. I also discussed with the patient that there may be a patient responsible charge related to this service. The patient expressed understanding and agreed to proceed.  Location of patient: At home  Location of provider: In my office  Persons participating in the televisit with the patient.   No one else was with the patient at the time    History of Present Illness: This is a 63 year old male with severe persistent asthma and reflux disease here in follow-up.  At the last visit we ordered allergy mold testing which was negative.  Patient states he is still dyspneic and now his symptoms are much more severe when his reflux is less well controlled.  He now has acid heartburn throughout the day at night.  He is only on once daily proton pump inhibitor.  He states he has postnasal drainage and mucus which causes upper airway issues. The patient continues to smoke a pack a day of cigarettes.   Pos in BOLD Constitutional:   No  weight loss, night sweats,  Fevers, chills, fatigue, lassitude. HEENT:   No headaches,  Difficulty swallowing,  Tooth/dental problems,  Sore throat,                No sneezing, itching, ear ache, nasal congestion, post nasal drip,   CV:  No chest pain,  Orthopnea, PND, swelling in lower extremities, anasarca, dizziness, palpitations  GI   heartburn, indigestion, abdominal pain, nausea, vomiting, diarrhea, change in bowel habits, loss of appetite  Resp:  shortness of breath with exertion or at rest.   excess mucus,  productive  cough,   non-productive cough,  No coughing up of blood.  No change in color of mucus.  No wheezing.  No chest wall deformity  Skin: no rash or lesions.  GU: no dysuria, change in color of urine, no urgency or frequency.  No flank pain.  MS:  No joint pain or swelling.  No decreased range of motion.  No back pain.  Psych:  No change in mood or affect. No depression or anxiety.  No memory loss.  Observations/Objective: No observations this was a telephone visit  Assessment and Plan: Asthma, severe persistent Severe persistent asthma with associated upper airway instability chronic sinusitis significant reflux disease and multiple atopic factors all triggers for this patient's symptom complex Plan will be for this patient to increase Protonix to twice daily and schedule ipratropium albuterol by nebulizer twice daily in addition to Advair and Spiriva  Smoking cessation is critical for success in this patient  Note pulmonary function studies previously showed severe obstructive defects  GERD (gastroesophageal reflux disease) Gastroesophageal reflux disease was playing a significant role  Plan is to increase Protonix to twice daily  Cough Cyclical cough likely on the basis of upper and lower airway inflammation and reflux    Tobacco dependence Ongoing tobacco use  I represcribed nicotine lozenge 4 mg 3 times daily  Allergic rhinitis The patient will continue Flonase 2 sprays twice daily in addition to loratadine  Follow Up Instructions: The patient knows  will be an in office visit in 2 months   I discussed the assessment and treatment plan with the patient. The patient was provided an opportunity to ask questions and all were answered. The patient agreed with the plan and demonstrated an understanding of the instructions.   The patient was advised to call back or seek an in-person evaluation if the symptoms worsen or if the condition fails to improve as anticipated.  I  provided 25 minutes of non-face-to-face time during this encounter  including  median intraservice time , review of notes, labs, imaging, medications  and explaining diagnosis and management to the patient .    Joseph Noble, MD

## 2018-12-10 ENCOUNTER — Other Ambulatory Visit: Payer: Self-pay

## 2018-12-10 ENCOUNTER — Ambulatory Visit: Payer: Self-pay | Attending: Critical Care Medicine | Admitting: Critical Care Medicine

## 2018-12-10 ENCOUNTER — Encounter: Payer: Self-pay | Admitting: Critical Care Medicine

## 2018-12-10 VITALS — BP 114/77 | HR 106 | Temp 98.9°F | Resp 18 | Ht 69.0 in | Wt 231.0 lb

## 2018-12-10 DIAGNOSIS — K219 Gastro-esophageal reflux disease without esophagitis: Secondary | ICD-10-CM

## 2018-12-10 DIAGNOSIS — J4550 Severe persistent asthma, uncomplicated: Secondary | ICD-10-CM

## 2018-12-10 DIAGNOSIS — J301 Allergic rhinitis due to pollen: Secondary | ICD-10-CM

## 2018-12-10 DIAGNOSIS — F17210 Nicotine dependence, cigarettes, uncomplicated: Secondary | ICD-10-CM

## 2018-12-10 DIAGNOSIS — F17200 Nicotine dependence, unspecified, uncomplicated: Secondary | ICD-10-CM

## 2018-12-10 DIAGNOSIS — F17209 Nicotine dependence, unspecified, with unspecified nicotine-induced disorders: Secondary | ICD-10-CM

## 2018-12-10 MED ORDER — FLUTTER DEVI: 0 refills | Status: DC

## 2018-12-10 MED ORDER — FLUTICASONE PROPIONATE 50 MCG/ACT NA SUSP: 2.0000 | Freq: Every day | NASAL | 11 refills | Status: DC

## 2018-12-10 MED FILL — FLUTICASONE PROP 50 MCG SPR: 50 | 30 days supply | Qty: 16 | Fill #0

## 2018-12-10 NOTE — Progress Notes (Signed)
Subjective:    Patient ID: Joseph Harvey, male    DOB: [birth year], 63 y.o.   MRN: [id number]  63 y.o.M with severe persistent asthma and reflux disease seen here in follow-up.  Note we did do mold and allergy testing at the last office visit and it was negative.  His eosinophil count is also not elevated.  He states that when he increased his proton pump inhibitor twice daily this provided significant improvement.  He does continue to smoke about 1 pack a day of cigarettes.  The patient maintains a Symbicort and Spiriva along with ipratropium nebulizer and twice daily Protonix.  The patient is yet to start the nicotine lozenge we prescribed and as well states that the mucus is hard to raise at times.  Past Medical History:  Diagnosis Date  . Allergy    Dust, mold, dust mites  . Asthma   . Cancer [facility])    prostate  . Hyperlipidemia   . Hypertension      Family History  Problem Relation Age of Onset  . Colon cancer Father   . Lung cancer Father        was a smoker  . Prostate cancer Father   . Lung cancer Mother        was a smoker  . Asthma Mother   . Allergies Brother   . Diabetes Maternal Grandmother      Social History   Socioeconomic History  . Marital status: Divorced    Spouse name: Not on file  . Number of children: 2  . Years of education: 12 grade  . Highest education level: Not on file  Occupational History  . 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 Financial resource strain: Not on file  . Food insecurity    Worry: Not on file    Inability: Not on file  . Transportation needs    Medical: Not on file    Non-medical: Not on file  Tobacco Use  . Smoking status: Current Every Day Smoker    Packs/day: 0.50    Years: 30.00    Pack years: 15.00    Types: Cigarettes  . Smokeless tobacco: Never Used  Substance and Sexual Activity  . Alcohol use: Yes    Alcohol/week: 3.0 standard drinks    Types: 3 Cans of beer per week   Comment: rarely  . Drug use: No  . Sexual activity: Not Currently  Lifestyle  . Physical activity    Days per week: Not on file    Minutes per session: Not on file  . Stress: Not on file  Relationships  . Social Herbalist on phone: Not on file    Gets together: Not on file    Attends religious service: Not on file    Active member of club or organization: Not on file    Attends meetings of clubs or organizations: Not on file    Relationship status: Not on file  . Intimate partner violence    Fear of current or ex partner: Not on file    Emotionally abused: Not on file    Physically abused: Not on file    Forced sexual activity: Not on file  Other Topics Concern  . Not on file  Social History Narrative  . Not on file     Allergies  Allergen Reactions  . Amoxicillin Anaphylaxis    Has patient had a PCN reaction causing  immediate rash, facial/tongue/throat swelling, SOB or lightheadedness with hypotension: Yes Has patient had a PCN reaction causing severe rash involving mucus membranes or skin necrosis: No Has patient had a PCN reaction that required hospitalization: Yes Has patient had a PCN reaction occurring within the last 10 years: Yes If all of the above answers are "NO", then may proceed with Cephalosporin use.   Marland Kitchen Lisinopril Shortness Of Breath  . Molds & Smuts Anaphylaxis  . Robitussin [Guaifenesin] Shortness Of Breath    wheezing     Outpatient Medications Prior to Visit  Medication Sig Dispense Refill  . albuterol (PROVENTIL HFA;VENTOLIN HFA) 108 (90 BASE) MCG/ACT inhaler Inhale 2 puffs into the lungs every 4 (four) hours as needed for wheezing or shortness of breath. 1 Inhaler 3  . albuterol (PROVENTIL) (2.5 MG/3ML) 0.083% nebulizer solution Take 3 mLs (2.5 mg total) by nebulization every 4 (four) hours as needed for wheezing or shortness of breath (((PLAN B))). 75 mL 3  . ASPIRIN 81 PO Take 81 mg by mouth daily.    . baclofen (LIORESAL) 10 MG tablet  Take 10 mg by mouth daily.    . budesonide-formoterol (SYMBICORT) 160-4.5 MCG/ACT inhaler Take 2 puffs first thing in am and then another 2 puffs about 12 hours later. 1 Inhaler 11  . Cholecalciferol (VITAMIN D PO) Take 1 tablet by mouth daily.    . cloNIDine (CATAPRES - DOSED IN MG/24 HR) 0.3 mg/24hr patch Place 0.3 mg onto the skin once a week.    . clotrimazole-betamethasone (LOTRISONE) cream Apply 1 application topically as needed.    . diclofenac sodium (VOLTAREN) 1 % GEL Apply 1 application topically daily as needed.    . Efinaconazole (JUBLIA) 10 % SOLN Apply 1 application topically as needed.    . gabapentin (NEURONTIN) 300 MG capsule Take 300 mg by mouth daily as needed.    . loratadine (CLARITIN) 10 MG tablet Take 1 tablet (10 mg total) by mouth daily. 30 tablet 3  . Luliconazole (LUZU) 1 % CREA Apply 1 application topically 2 (two) times daily.    . metFORMIN (GLUCOPHAGE-XR) 500 MG 24 hr tablet Take 1 tablet (500 mg total) by mouth daily. 30 tablet 2  . montelukast (SINGULAIR) 10 MG tablet Take 1 tablet (10 mg total) by mouth at bedtime. 30 tablet 3  . Nebivolol HCl (BYSTOLIC) 20 MG TABS Take 1 tablet by mouth 2 (two) times daily.    . nicotine polacrilex (NICOTINE MINI) 4 MG lozenge Take 1 lozenge (4 mg total) by mouth as needed for smoking cessation. 100 tablet 0  . pantoprazole (PROTONIX) 40 MG tablet Take 1 tablet (40 mg total) by mouth 2 (two) times daily before a meal. 60 tablet 3  . potassium chloride SA (KLOR-CON M20) 20 MEQ tablet Take 20 mEq by mouth daily.    . rosuvastatin (CRESTOR) 20 MG tablet Take 1 tablet (20 mg total) by mouth daily. 30 tablet 3  . terbinafine (LAMISIL) 250 MG tablet Take 250 mg by mouth daily.    Marland Kitchen triamterene-hydrochlorothiazide (DYAZIDE) 37.5-25 MG capsule Take 1 capsule by mouth daily.    . fluticasone (FLONASE) 50 MCG/ACT nasal spray Place 2 sprays into both nostrils daily.    Marland Kitchen ipratropium-albuterol (DUONEB) 0.5-2.5 (3) MG/3ML SOLN Take 1 mL by  nebulization 2 (two) times a day. 60 mL 3   No facility-administered medications prior to visit.       Review of Systems Constitutional:   No  weight loss, night sweats,  Fevers, chills, fatigue, lassitude. HEENT:   No headaches,  Difficulty swallowing,  Tooth/dental problems,  Sore throat,                No sneezing, itching, ear ache, nasal congestion, post nasal drip,   CV:  No chest pain,  Orthopnea, PND, swelling in lower extremities, anasarca, dizziness, palpitations  GI  o heartburn, indigestion, abdominal pain, nausea, vomiting, diarrhea, change in bowel habits, loss of appetite  Resp:  shortness of breath with exertion or at rest.  No excess mucus,  productive cough,   non-productive cough,  No coughing up of blood.  No change in color of mucus.  No wheezing.  No chest wall deformity  Skin: no rash or lesions.  GU: no dysuria, change in color of urine, no urgency or frequency.  No flank pain.  MS:  No joint pain or swelling.  No decreased range of motion.  No back pain.  Psych:  No change in mood or affect. No depression or anxiety.  No memory loss.     Objective:   Physical Exam Vitals:   12/10/18 0847  BP: 114/77  Pulse: (!) 106  Resp: 18  Temp: 98.9 F (37.2 C)  TempSrc: Oral  SpO2: 97%  Weight: 231 lb (104.8 kg)  Height: 5\' 9"  (1.753 m)    Gen: Pleasant, obese, in no distress,  normal affect  ENT: No lesions,  mouth clear,  oropharynx clear, no postnasal drip  Neck: No JVD, no TMG, no carotid bruits  Lungs: No use of accessory muscles, no dullness to percussion, distant breath sounds with few expired wheezes  Cardiovascular: RRR, heart sounds normal, no murmur or gallops, no peripheral edema  Abdomen: soft and NT, no HSM,  BS normal  Musculoskeletal: No deformities, no cyanosis or clubbing  Neuro: alert, non focal  Skin: Warm, no lesions or rashes        Assessment & Plan:  I personally reviewed all images and lab data in the [facility] system as  well as any outside material available during this office visit and agree with the  radiology impressions.   Asthma, severe persistent Severe persistent asthma with significant airway obstruction however stable at this time on Symbicort and Spiriva  Maintain current inhaled medications as prescribed We will give the patient a flutter valve to see if this will help mobilizing secretions  GERD (gastroesophageal reflux disease) Gastrosoft reflux disease stable at this time  Continue twice daily proton pump inhibitor  Tobacco dependence Ongoing tobacco dependence  I spent 10 minutes with smoking cessation counseling with this patient   Darey was seen today for follow-up.  Diagnoses and all orders for this visit:  Non-seasonal allergic rhinitis due to pollen  Severe persistent asthma without complication  Tobacco dependence  Tobacco use disorder, continuous  Morbid obesity, unspecified obesity type ([place])  Gastroesophageal reflux disease without esophagitis  Other orders -     fluticasone (FLONASE) 50 MCG/ACT nasal spray; Place 2 sprays into both nostrils daily. -     Respiratory Therapy Supplies (FLUTTER) DEVI; Use three times a day after inhaler or nebulizer use    I had an extended discussion with the patient and or family lasting 10 minutes of a 25 minute visit including: Smoking cessation

## 2018-12-10 NOTE — Assessment & Plan Note (Signed)
Ongoing tobacco dependence  I spent 10 minutes with smoking cessation counseling with this patient

## 2018-12-10 NOTE — Assessment & Plan Note (Signed)
Gastrosoft reflux disease stable at this time  Continue twice daily proton pump inhibitor

## 2018-12-10 NOTE — Patient Instructions (Addendum)
Try the flutter valve and use it 3 times a day after an inhaler or nebulizer use  No change in medications  Flonase was represcribed 2 sprays each nostril daily to our pharmacy  Focus on smoking cessation using nicotine lozenge 2-3 times daily  Return in follow-up 3 months   Tobacco Use Disorder Tobacco use disorder (TUD) occurs when a person craves, seeks, and uses tobacco, regardless of the consequences. This disorder can cause problems with mental and physical health. It can affect your ability to have healthy relationships, and it can keep you from meeting your responsibilities at work, home, or school. Tobacco may be:  Smoked as a cigarette or cigar.  Inhaled using e-cigarettes.  Smoked in a pipe or hookah.  Chewed as smokeless tobacco.  Inhaled into the nostrils as snuff. Tobacco products contain a dangerous chemical called nicotine, which is very addictive. Nicotine triggers hormones that make the body feel stimulated and works on areas of the brain that make you feel good. These effects can make it hard for people to quit nicotine. Tobacco contains many other unsafe chemicals that can damage almost every organ in the body. Smoking tobacco also puts others in danger due to fire risk and possible health problems caused by breathing in secondhand smoke. What are the signs or symptoms? Symptoms of TUD may include:  Being unable to slow down or stop your tobacco use.  Spending an abnormal amount of time getting or using tobacco.  Craving tobacco. Cravings may last for up to 6 months after quitting.  Tobacco use that: ? Interferes with your work, school, or home life. ? Interferes with your personal and social relationships. ? Makes you give up activities that you once enjoyed or found important.  Using tobacco even though you know that it is: ? Dangerous or bad for your health or someone else's health. ? Causing problems in your life.  Needing more and more of the  substance to get the same effect (developing tolerance).  Experiencing unpleasant symptoms if you do not use the substance (withdrawal). Withdrawal symptoms may include: ? Depressed, anxious, or irritable mood. ? Difficulty concentrating. ? Increased appetite. ? Restlessness or trouble sleeping.  Using the substance to avoid withdrawal. How is this diagnosed? This condition may be diagnosed based on:  Your current and past tobacco use. Your health care provider may ask questions about how your tobacco use affects your life.  A physical exam. You may be diagnosed with TUD if you have at least two symptoms within a 50-month period. How is this treated? This condition is treated by stopping tobacco use. Many people are unable to quit on their own and need help. Treatment may include:  Nicotine replacement therapy (NRT). NRT provides nicotine without the other harmful chemicals in tobacco. NRT gradually lowers the dosage of nicotine in the body and reduces withdrawal symptoms. NRT is available as: ? Over-the-counter gums, lozenges, and skin patches. ? Prescription mouth inhalers and nasal sprays.  Medicine that acts on the brain to reduce cravings and withdrawal symptoms.  A type of talk therapy that examines your triggers for tobacco use, how to avoid them, and how to cope with cravings (behavioral therapy).  Hypnosis. This may help with withdrawal symptoms.  Joining a support group for others coping with TUD. The best treatment for TUD is usually a combination of medicine, talk therapy, and support groups. Recovery can be a long process. Many people start using tobacco again after stopping (relapse). If you relapse,  it does not mean that treatment will not work. Follow these instructions at home:  Lifestyle  Do not use any products that contain nicotine or tobacco, such as cigarettes and e-cigarettes.  Avoid things that trigger tobacco use as much as you can. Triggers include  people and situations that usually cause you to use tobacco.  Avoid drinks that contain caffeine, including coffee. These may worsen some withdrawal symptoms.  Find ways to manage stress. Wanting to smoke may cause stress, and stress can make you want to smoke. Relaxation techniques such as deep breathing, meditation, and yoga may help.  Attend support groups as needed. These groups are an important part of long-term recovery for many people. General instructions  Take over-the-counter and prescription medicines only as told by your health care provider.  Check with your health care provider before taking any new prescription or over-the-counter medicines.  Decide on a friend, family member, or smoking quit-line (such as 1-800-QUIT-NOW in the [place] that you can call or text when you feel the urge to smoke or when you need help coping with cravings.  Keep all follow-up visits as told by your health care provider and therapist. This is important. Contact a health care provider if:  You are not able to take your medicines as prescribed.  Your symptoms get worse, even with treatment. Summary  Tobacco use disorder (TUD) occurs when a person craves, seeks, and uses tobacco regardless of the consequences.  This condition may be diagnosed based on your current and past tobacco use and a physical exam.  Many people are unable to quit on their own and need help. Recovery can be a long process.  The most effective treatment for TUD is usually a combination of medicine, talk therapy, and support groups. This information is not intended to replace advice given to you by your health care provider. Make sure you discuss any questions you have with your health care provider. Document Released: 01/06/2004 Document Revised: 04/19/2017 Document Reviewed: 04/19/2017 Elsevier Patient Education  2020 [employer].  Smoking Tobacco Information, Adult Smoking tobacco can be harmful to your health.  Tobacco contains a poisonous (toxic), colorless chemical called nicotine. Nicotine is addictive. It changes the brain and can make it hard to stop smoking. Tobacco also has other toxic chemicals that can hurt your body and raise your risk of many cancers. How can smoking tobacco affect me? Smoking tobacco puts you at risk for:  Cancer. Smoking is most commonly associated with lung cancer, but can also lead to cancer in other parts of the body.  Chronic obstructive pulmonary disease (COPD). This is a long-term lung condition that makes it hard to breathe. It also gets worse over time.  High blood pressure (hypertension), heart disease, stroke, or heart attack.  Lung infections, such as pneumonia.  Cataracts. This is when the lenses in the eyes become clouded.  Digestive problems. This may include peptic ulcers, heartburn, and gastroesophageal reflux disease (GERD).  Oral health problems, such as gum disease and tooth loss.  Loss of taste and smell. Smoking can affect your appearance by causing:  Wrinkles.  Yellow or stained teeth, fingers, and fingernails. Smoking tobacco can also affect your social life, because:  It may be challenging to find places to smoke when away from home. Many workplaces, [employer], hotels, and public places are tobacco-free.  Smoking is expensive. This is due to the cost of tobacco and the long-term costs of treating health problems from smoking.  Secondhand smoke may affect  those around you. Secondhand smoke can cause lung cancer, breathing problems, and heart disease. Children of smokers have a higher risk for: ? Sudden infant death syndrome (SIDS). ? Ear infections. ? Lung infections. If you currently smoke tobacco, quitting now can help you:  Lead a longer and healthier life.  Look, smell, breathe, and feel better over time.  Save money.  Protect others from the harms of secondhand smoke. What actions can I take to prevent health  problems? Quit smoking   Do not start smoking. Quit if you already do.  Make a plan to quit smoking and commit to it. Look for programs to help you and ask your health care provider for recommendations and ideas.  Set a date and write down all the reasons you want to quit.  Let your friends and family know you are quitting so they can help and support you. Consider finding friends who also want to quit. It can be easier to quit with someone else, so that you can support each other.  Talk with your health care provider about using nicotine replacement medicines to help you quit, such as gum, lozenges, patches, sprays, or pills.  Do not replace cigarette smoking with electronic cigarettes, which are commonly called e-cigarettes. The safety of e-cigarettes is not known, and some may contain harmful chemicals.  If you try to quit but return to smoking, stay positive. It is common to slip up when you first quit, so take it one day at a time.  Be prepared for cravings. When you feel the urge to smoke, chew gum or suck on hard candy. Lifestyle  Stay busy and take care of your body.  Drink enough fluid to keep your urine pale yellow.  Get plenty of exercise and eat a healthy diet. This can help prevent weight gain after quitting.  Monitor your eating habits. Quitting smoking can cause you to have a larger appetite than when you smoke.  Find ways to relax. Go out with friends or family to a movie or a restaurant where people do not smoke.  Ask your health care provider about having regular tests (screenings) to check for cancer. This may include blood tests, imaging tests, and other tests.  Find ways to manage your stress, such as meditation, yoga, or exercise. Where to find support To get support to quit smoking, consider:  Asking your health care provider for more information and resources.  Taking classes to learn more about quitting smoking.  Looking for local organizations that  offer resources about quitting smoking.  Joining a support group for people who want to quit smoking in your local community.  Calling the smokefree.gov counselor helpline: 1-800-Quit-Now 3343190910) Where to find more information You may find more information about quitting smoking from:  HelpGuide.org: www.helpguide.org  https://hall.com/: smokefree.gov  American Lung Association: www.lung.org Contact a health care provider if you:  Have problems breathing.  Notice that your lips, nose, or fingers turn blue.  Have chest pain.  Are coughing up blood.  Feel faint or you pass out.  Have other health changes that cause you to worry. Summary  Smoking tobacco can negatively affect your health, the health of those around you, your finances, and your social life.  Do not start smoking. Quit if you already do. If you need help quitting, ask your health care provider.  Think about joining a support group for people who want to quit smoking in your local community. There are many effective programs that will help  you to quit this behavior. This information is not intended to replace advice given to you by your health care provider. Make sure you discuss any questions you have with your health care provider. Document Released: 05/17/2016 Document Revised: 06/21/2017 Document Reviewed: 05/17/2016 Elsevier Patient Education  2020 [employer].

## 2018-12-10 NOTE — Assessment & Plan Note (Addendum)
Severe persistent asthma with significant airway obstruction however stable at this time on Symbicort and Spiriva  Maintain current inhaled medications as prescribed We will give the patient a flutter valve to see if this will help mobilizing secretions

## 2018-12-14 MED FILL — $BYSTOLIC 20 MG TABLET: 20 | 90 days supply | Qty: 90 | Fill #2

## 2018-12-17 MED FILL — IPRAT-ALBUT 0.5-3(2.5) MG/3: 0.5-2.5 (3) | 7 days supply | Qty: 90 | Fill #4

## 2018-12-17 MED FILL — ALBUTEROL SUL 2.5 MG/3 ML S: (2.5 MG/3ML | 15 days supply | Qty: 180 | Fill #3

## 2018-12-17 MED FILL — ?TRIAMTERENE/HCTZ 37.5/25TB: 37.5-25 | 30 days supply | Qty: 30 | Fill #2

## 2018-12-26 ENCOUNTER — Other Ambulatory Visit: Payer: Self-pay

## 2018-12-26 DIAGNOSIS — Z20822 Contact with and (suspected) exposure to covid-19: Secondary | ICD-10-CM

## 2018-12-27 LAB — NOVEL CORONAVIRUS, NAA: SARS-CoV-2, NAA: NOT DETECTED

## 2019-01-07 ENCOUNTER — Ambulatory Visit: Payer: Self-pay | Attending: Internal Medicine | Admitting: Internal Medicine

## 2019-01-07 ENCOUNTER — Encounter: Payer: Self-pay | Admitting: Internal Medicine

## 2019-01-07 DIAGNOSIS — J0101 Acute recurrent maxillary sinusitis: Secondary | ICD-10-CM

## 2019-01-07 DIAGNOSIS — J4550 Severe persistent asthma, uncomplicated: Secondary | ICD-10-CM

## 2019-01-07 DIAGNOSIS — Z23 Encounter for immunization: Secondary | ICD-10-CM

## 2019-01-07 DIAGNOSIS — F17200 Nicotine dependence, unspecified, uncomplicated: Secondary | ICD-10-CM

## 2019-01-07 DIAGNOSIS — Z860100 Personal history of colon polyps, unspecified: Secondary | ICD-10-CM

## 2019-01-07 DIAGNOSIS — C61 Malignant neoplasm of prostate: Secondary | ICD-10-CM

## 2019-01-07 DIAGNOSIS — E119 Type 2 diabetes mellitus without complications: Secondary | ICD-10-CM

## 2019-01-07 DIAGNOSIS — I10 Essential (primary) hypertension: Secondary | ICD-10-CM

## 2019-01-07 MED ORDER — TRUEPLUS LANCETS 28G MISC: 4 refills | Status: AC

## 2019-01-07 MED ORDER — DOXYCYCLINE HYCLATE 100 MG PO TABS: 100.0000 mg | ORAL_TABLET | Freq: Two times a day (BID) | ORAL | 0 refills | Status: DC

## 2019-01-07 MED ORDER — TRUE METRIX BLOOD GLUCOSE TEST VI STRP: ORAL_STRIP | 12 refills | Status: AC

## 2019-01-07 MED ORDER — TRUE METRIX METER W/DEVICE KIT: PACK | 0 refills | Status: DC

## 2019-01-07 MED FILL — DOXYCYCLINE HYCLATE 100 MG: 100 | 10 days supply | Qty: 20 | Fill #0

## 2019-01-07 NOTE — Progress Notes (Signed)
Virtual Visit via Telephone Note Due to current restrictions/limitations of in-office visits due to the COVID-19 pandemic, this scheduled clinical appointment was converted to a telehealth visit  I connected with Joseph Harvey on 01/07/19 at 10 a.m by telephone and verified that I am speaking with the correct person using two identifiers. I am in my office.  The patient is in his car.  Only the patient and myself participated in this encounter.  I discussed the limitations, risks, security and privacy concerns of performing an evaluation and management service by telephone and the availability of in person appointments. I also discussed with the patient that there may be a patient responsible charge related to this service. The patient expressed understanding and agreed to proceed.   History of Present Illness: Patient with history of HTN, HL, DM type II, tob dep, GERD, severe persistent asthma, vitamin D deficiency, CKD 2, prostate CA, colon polys.  Last eval by Dr. Joya Gaskins.  Pt had came for in-person visit today but was turned away at door because he did not pass screening questions at the door  Asthma/Allergic Rhinitis:  Saw Dr. Joya Gaskins x 2 visits since last visit.  Prescribed Protonix.  Mold allergy test was negative.  Reports compliance with Symbicort Breathing worse when it rains.  Takes Prednisone 50 mg "only when I can't really breath."  Takes Prednisone about 3 x/mth Feels he has sinus infection at this time causing sore throat.  Continues to feel like the LT nares, sinus stays congested.   -did not qualify for [facility] discount.  Told he has to applied for Medicaid.  He plans to try and roll for private insurance this fall so that he can be referred to an ENT specialist.  Tob dep:  "It various with my stress level."    Using Nicotine Lozenge occasionally  HYPERTENSION Currently taking: see medication list Med Adherence: [x]  Yes    []  No Medication side effects: []  Yes    [x]   No Adherence with salt restriction: [x]  Yes    []  No Home Monitoring?: [x]  Yes    []  No Monitoring Frequency: 1-2x/wk Home BP results range: Reports level have been good SOB? []  Yes    []  No Chest Pain?: []  Yes    [x]  No Leg swelling?: []  Yes    [x]  No Headaches?: []  Yes    [x]  No Dizziness? []  Yes    [x]  No Comments:   DM:  Stripes have expired.  Would like to get meter and stripes from our pharmacy Reports he takes Metformin only for 3 days after he takes Prednisone. "I try to eat right."  Try to eat more fruits and white meats.   Prostate CA: last PSA 09/2018 was 0.1.  Last saw urologist 1-1.5 yrs ago.  Wants to do f/u but can not afford. Plans to enroll for insurance this November  HM:  Reports getting Tdap , shingles vaccine, hep series at [facility] 2018.    Outpatient Encounter Medications as of 01/07/2019  Medication Sig Note  . albuterol (PROVENTIL HFA;VENTOLIN HFA) 108 (90 BASE) MCG/ACT inhaler Inhale 2 puffs into the lungs every 4 (four) hours as needed for wheezing or shortness of breath.   Marland Kitchen albuterol (PROVENTIL) (2.5 MG/3ML) 0.083% nebulizer solution Take 3 mLs (2.5 mg total) by nebulization every 4 (four) hours as needed for wheezing or shortness of breath (((PLAN B))).   . ASPIRIN 81 PO Take 81 mg by mouth daily.   . baclofen (LIORESAL) 10 MG tablet Take  10 mg by mouth daily.   . budesonide-formoterol (SYMBICORT) 160-4.5 MCG/ACT inhaler Take 2 puffs first thing in am and then another 2 puffs about 12 hours later.   . Cholecalciferol (VITAMIN D PO) Take 1 tablet by mouth daily.   . cloNIDine (CATAPRES - DOSED IN MG/24 HR) 0.3 mg/24hr patch Place 0.3 mg onto the skin once a week.   . clotrimazole-betamethasone (LOTRISONE) cream Apply 1 application topically as needed.   . diclofenac sodium (VOLTAREN) 1 % GEL Apply 1 application topically daily as needed.   . Efinaconazole (JUBLIA) 10 % SOLN Apply 1 application topically as needed.   . fluticasone (FLONASE) 50 MCG/ACT nasal  spray Place 2 sprays into both nostrils daily.   Marland Kitchen gabapentin (NEURONTIN) 300 MG capsule Take 300 mg by mouth daily as needed.   Marland Kitchen ipratropium-albuterol (DUONEB) 0.5-2.5 (3) MG/3ML SOLN Take 1 mL by nebulization 2 (two) times a day.   . loratadine (CLARITIN) 10 MG tablet Take 1 tablet (10 mg total) by mouth daily.   . Luliconazole (LUZU) 1 % CREA Apply 1 application topically 2 (two) times daily.   . metFORMIN (GLUCOPHAGE-XR) 500 MG 24 hr tablet Take 1 tablet (500 mg total) by mouth daily.   . montelukast (SINGULAIR) 10 MG tablet Take 1 tablet (10 mg total) by mouth at bedtime.   . Nebivolol HCl (BYSTOLIC) 20 MG TABS Take 1 tablet by mouth 2 (two) times daily. 10/14/2017: Product not picked up  . nicotine polacrilex (NICOTINE MINI) 4 MG lozenge Take 1 lozenge (4 mg total) by mouth as needed for smoking cessation.   . pantoprazole (PROTONIX) 40 MG tablet Take 1 tablet (40 mg total) by mouth 2 (two) times daily before a meal.   . potassium chloride SA (KLOR-CON M20) 20 MEQ tablet Take 20 mEq by mouth daily.   Marland Kitchen Respiratory Therapy Supplies (FLUTTER) DEVI Use three times a day after inhaler or nebulizer use   . rosuvastatin (CRESTOR) 20 MG tablet Take 1 tablet (20 mg total) by mouth daily.   Marland Kitchen terbinafine (LAMISIL) 250 MG tablet Take 250 mg by mouth daily.   Marland Kitchen triamterene-hydrochlorothiazide (DYAZIDE) 37.5-25 MG capsule Take 1 capsule by mouth daily.    No facility-administered encounter medications on file as of 01/07/2019.     Observations/Objective: No direct observation done as this was a tele-visit  Assessment and Plan: 1. Severe persistent asthma without complication -pt to continue current inhalers and allergy medications.  Continue Protonix -Strongly advised to quit smoking  2. Acute recurrent maxillary sinusitis - doxycycline (VIBRA-TABS) 100 MG tablet; Take 1 tablet (100 mg total) by mouth 2 (two) times daily.  Dispense: 20 tablet; Refill: 0  3. Tobacco dependence Strongly advised  to quit smoking.  Encouraged him to use the nicotine lozenges consistently Less than 5 minutes spent on counseling. 4. Essential hypertension Home blood pressure readings have been good.  Continue current medications  5. Controlled type 2 diabetes mellitus without complication, without long-term current use of insulin ([place]) For the most part diet controlled except he uses metformin when he takes prednisone.  Prescription sent to our pharmacy for diabetic testing supplies - Hemoglobin A1c; Future  6. Malignant neoplasm of prostate (HCC) PSA this year has been less than 1.  7. Need for influenza vaccination We will have my CMA schedule him to come as a nurse only visit for flu vaccine  8. History of colon polyps Plan to refer to GI for colonoscopy once he has insurance   Follow  Up Instructions: F/u in 3 mths   I discussed the assessment and treatment plan with the patient. The patient was provided an opportunity to ask questions and all were answered. The patient agreed with the plan and demonstrated an understanding of the instructions.   The patient was advised to call back or seek an in-person evaluation if the symptoms worsen or if the condition fails to improve as anticipated.  I provided 23 minutes of non-face-to-face time during this encounter.   Karle Plumber, MD

## 2019-01-08 MED FILL — !TRUE METRIX BLOOD GLUCOSE: 30 days supply | Qty: 1 | Fill #0

## 2019-01-08 MED FILL — TRUE METRIX TEST STRIP: 30 days supply | Qty: 100 | Fill #0

## 2019-01-08 MED FILL — TRUEplus LANCETS 28G MISC: 30 days supply | Qty: 100 | Fill #0

## 2019-01-10 ENCOUNTER — Telehealth: Payer: Self-pay | Admitting: Internal Medicine

## 2019-01-10 MED FILL — FLUTICASONE PROP 50 MCG SPR: 50 | 30 days supply | Qty: 16 | Fill #1

## 2019-01-10 MED FILL — predniSONE 10 MG TABS: 10 | 7 days supply | Qty: 35 | Fill #2

## 2019-01-10 MED FILL — ?PANTOPRAZOLE SODI DR 40MGT: 40 | 30 days supply | Qty: 60 | Fill #2

## 2019-01-10 NOTE — Telephone Encounter (Signed)
New Message   Pt dropped of paperwork of Doctor and paperwork was left in the providers box

## 2019-01-16 ENCOUNTER — Other Ambulatory Visit: Payer: Self-pay | Admitting: Pharmacist

## 2019-01-16 MED ORDER — TRIAMTERENE-HCTZ 37.5-25 MG PO CAPS: 1.0000 | ORAL_CAPSULE | Freq: Every day | ORAL | 2 refills | Status: DC

## 2019-01-16 MED FILL — ?ROSUVASTATIN CALCIUM 20MG: 20 | 30 days supply | Qty: 30 | Fill #3

## 2019-01-17 MED FILL — METFORMIN HCL ER 500 MG TB2: 500 | 30 days supply | Qty: 30 | Fill #1

## 2019-01-17 MED FILL — TRIAMTERENE/HCTZ 37.5/25 CP: 37.5-25 | 30 days supply | Qty: 30 | Fill #0

## 2019-01-18 ENCOUNTER — Other Ambulatory Visit: Payer: Self-pay | Admitting: Pharmacist

## 2019-01-18 MED ORDER — ALBUTEROL SULFATE HFA 108 (90 BASE) MCG/ACT IN AERS: 2.0000 | INHALATION_SPRAY | RESPIRATORY_TRACT | 2 refills | Status: DC | PRN

## 2019-01-23 ENCOUNTER — Other Ambulatory Visit: Payer: Self-pay

## 2019-01-23 DIAGNOSIS — Z20822 Contact with and (suspected) exposure to covid-19: Secondary | ICD-10-CM

## 2019-01-24 LAB — NOVEL CORONAVIRUS, NAA: SARS-CoV-2, NAA: NOT DETECTED

## 2019-01-28 ENCOUNTER — Ambulatory Visit: Payer: Self-pay | Attending: Family Medicine | Admitting: Pharmacist

## 2019-01-28 ENCOUNTER — Ambulatory Visit: Payer: Self-pay

## 2019-01-28 ENCOUNTER — Other Ambulatory Visit: Payer: Self-pay

## 2019-01-28 DIAGNOSIS — Z23 Encounter for immunization: Secondary | ICD-10-CM

## 2019-01-28 NOTE — Progress Notes (Signed)
Patient presents for vaccination against influenza per orders of Dr. Johnson. Consent given. Counseling provided. No contraindications exists. Vaccine administered without incident.   

## 2019-01-29 ENCOUNTER — Other Ambulatory Visit: Payer: Self-pay | Admitting: Pharmacist

## 2019-01-29 LAB — HEMOGLOBIN A1C
Est. average glucose Bld gHb Est-mCnc: 134 mg/dL
Hgb A1c MFr Bld: 6.3 % — ABNORMAL HIGH (ref 4.8–5.6)

## 2019-01-29 MED ORDER — ALBUTEROL SULFATE HFA 108 (90 BASE) MCG/ACT IN AERS: 2.0000 | INHALATION_SPRAY | RESPIRATORY_TRACT | 2 refills | Status: DC | PRN

## 2019-01-29 MED ORDER — ALBUTEROL SULFATE HFA 108 (90 BASE) MCG/ACT IN AERS: 2.0000 | INHALATION_SPRAY | RESPIRATORY_TRACT | 3 refills | Status: DC | PRN

## 2019-01-29 MED FILL — $VENTOLIN HFA 18G INHALER: 108 (90 BAS | 25 days supply | Qty: 18 | Fill #0

## 2019-01-31 ENCOUNTER — Other Ambulatory Visit: Payer: Self-pay | Admitting: Critical Care Medicine

## 2019-01-31 MED FILL — IPRAT-ALBUT 0.5-3(2.5) MG/3: 0.5-2.5 (3) | 45 days supply | Qty: 90 | Fill #0

## 2019-01-31 MED FILL — ALBUTEROL SUL 2.5 MG/3 ML S: (2.5 MG/3ML | 15 days supply | Qty: 180 | Fill #4

## 2019-02-19 ENCOUNTER — Other Ambulatory Visit: Payer: Self-pay | Admitting: Internal Medicine

## 2019-02-19 DIAGNOSIS — J0101 Acute recurrent maxillary sinusitis: Secondary | ICD-10-CM

## 2019-02-21 MED FILL — FLUTICASONE PROP 50 MCG SPR: 50 | 30 days supply | Qty: 16 | Fill #2

## 2019-02-22 ENCOUNTER — Other Ambulatory Visit: Payer: Self-pay | Admitting: Internal Medicine

## 2019-02-22 DIAGNOSIS — J0101 Acute recurrent maxillary sinusitis: Secondary | ICD-10-CM

## 2019-03-05 MED FILL — ?DOXYCYCLINE HYCLATE 100MG: 100 | 10 days supply | Qty: 20 | Fill #0

## 2019-03-11 MED FILL — $BYSTOLIC 20 MG TABLET: 20 | 90 days supply | Qty: 90 | Fill #3

## 2019-03-18 ENCOUNTER — Other Ambulatory Visit: Payer: Self-pay | Admitting: Internal Medicine

## 2019-03-18 ENCOUNTER — Other Ambulatory Visit: Payer: Self-pay | Admitting: Pharmacist

## 2019-03-18 DIAGNOSIS — E785 Hyperlipidemia, unspecified: Secondary | ICD-10-CM

## 2019-03-18 DIAGNOSIS — E1169 Type 2 diabetes mellitus with other specified complication: Secondary | ICD-10-CM

## 2019-03-18 MED ORDER — IPRATROPIUM-ALBUTEROL 0.5-2.5 (3) MG/3ML IN SOLN: 1.0000 mL | Freq: Two times a day (BID) | RESPIRATORY_TRACT | 1 refills | Status: DC

## 2019-03-18 MED FILL — ?ROSUVASTATIN CALCIUM 20MG: 20 | 30 days supply | Qty: 30 | Fill #0

## 2019-03-18 MED FILL — ?DOXYCYCLINE HYCLATE 100MG: 100 | 10 days supply | Qty: 20 | Fill #0

## 2019-03-18 MED FILL — FLUTICASONE PROP 50 MCG SPR: 50 | 30 days supply | Qty: 16 | Fill #3

## 2019-03-18 MED FILL — ALBUTEROL SUL 2.5 MG/3 ML S: (2.5 MG/3ML | 15 days supply | Qty: 180 | Fill #6

## 2019-03-18 MED FILL — PANTOPRAZOLE SOD DR 40 MG T: 40 | 30 days supply | Qty: 30 | Fill #1

## 2019-03-18 MED FILL — TRIAMTERENE/HCTZ 37.5/25 CP: 37.5-25 | 30 days supply | Qty: 30 | Fill #2

## 2019-03-18 MED FILL — METFORMIN HCL ER 500 MG TB2: 500 | 30 days supply | Qty: 30 | Fill #2

## 2019-03-18 MED FILL — IPRAT-ALBUT 0.5-3(2.5) MG/3: 0.5-2.5 (3) | 30 days supply | Qty: 180 | Fill #0

## 2019-03-18 MED FILL — $VENTOLIN HFA 18G INHALER: 108 (90 BAS | 25 days supply | Qty: 18 | Fill #0

## 2019-03-18 MED FILL — ?PREDNISONE 10 MG TABLET: 10 | 7 days supply | Qty: 35 | Fill #4

## 2019-04-16 ENCOUNTER — Other Ambulatory Visit: Payer: Self-pay | Admitting: Internal Medicine

## 2019-04-16 ENCOUNTER — Other Ambulatory Visit: Payer: Self-pay | Admitting: Critical Care Medicine

## 2019-04-16 MED FILL — ?DOXYCYCLINE HYCLATE 100MG: 100 | 10 days supply | Qty: 20 | Fill #1

## 2019-04-16 MED FILL — predniSONE 10 MG TABS: 10 | 7 days supply | Qty: 35 | Fill #5

## 2019-04-16 MED FILL — FLUTICASONE PROP 50 MCG SPR: 50 | 30 days supply | Qty: 16 | Fill #4

## 2019-04-16 MED FILL — TRIAMTERENE/HCTZ 37.5/25 CP: 37.5-25 | 30 days supply | Qty: 30 | Fill #0

## 2019-04-16 MED FILL — ALBUTEROL SUL 2.5 MG/3 ML S: (2.5 MG/3ML | 15 days supply | Qty: 180 | Fill #7

## 2019-04-16 MED FILL — MONTELUKAST SOD 10 MG TAB: 10 | 30 days supply | Qty: 30 | Fill #0

## 2019-04-16 MED FILL — METFORMIN HCL ER 500 MG TB2: 500 | 30 days supply | Qty: 30 | Fill #0

## 2019-04-17 MED FILL — !VENTOLIN HFA INHALER: 108 (90 BAS | 25 days supply | Qty: 18 | Fill #1

## 2019-04-18 ENCOUNTER — Other Ambulatory Visit: Payer: Self-pay

## 2019-04-18 ENCOUNTER — Ambulatory Visit: Payer: Self-pay | Attending: Internal Medicine | Admitting: Internal Medicine

## 2019-04-18 ENCOUNTER — Encounter: Payer: Self-pay | Admitting: Internal Medicine

## 2019-04-18 DIAGNOSIS — Z8546 Personal history of malignant neoplasm of prostate: Secondary | ICD-10-CM

## 2019-04-18 DIAGNOSIS — C61 Malignant neoplasm of prostate: Secondary | ICD-10-CM

## 2019-04-18 DIAGNOSIS — Z114 Encounter for screening for human immunodeficiency virus [HIV]: Secondary | ICD-10-CM

## 2019-04-18 DIAGNOSIS — K5900 Constipation, unspecified: Secondary | ICD-10-CM

## 2019-04-18 DIAGNOSIS — J4550 Severe persistent asthma, uncomplicated: Secondary | ICD-10-CM

## 2019-04-18 DIAGNOSIS — J0101 Acute recurrent maxillary sinusitis: Secondary | ICD-10-CM

## 2019-04-18 DIAGNOSIS — E119 Type 2 diabetes mellitus without complications: Secondary | ICD-10-CM

## 2019-04-18 DIAGNOSIS — M5416 Radiculopathy, lumbar region: Secondary | ICD-10-CM

## 2019-04-18 DIAGNOSIS — Z86010 Personal history of colonic polyps: Secondary | ICD-10-CM

## 2019-04-18 DIAGNOSIS — Z1159 Encounter for screening for other viral diseases: Secondary | ICD-10-CM

## 2019-04-18 MED ORDER — GABAPENTIN 300 MG PO CAPS: 300.0000 mg | ORAL_CAPSULE | Freq: Every day | ORAL | 1 refills | Status: DC | PRN

## 2019-04-18 MED ORDER — POLYETHYLENE GLYCOL 3350 17 GM/SCOOP PO POWD: 17.0000 g | Freq: Every day | ORAL | 1 refills | Status: DC | PRN

## 2019-04-18 MED ORDER — DOCUSATE SODIUM 100 MG PO CAPS: 100.0000 mg | ORAL_CAPSULE | Freq: Two times a day (BID) | ORAL | 0 refills | Status: DC | PRN

## 2019-04-18 MED FILL — POLYETHYLENE GLYCOL 3350 PO: 17 | 14 days supply | Qty: 238 | Fill #0

## 2019-04-18 MED FILL — GABAPENTIN 300 MG CAPSULE: 300 | 30 days supply | Qty: 30 | Fill #0

## 2019-04-18 NOTE — Progress Notes (Signed)
Virtual Visit via Telephone Note Due to current restrictions/limitations of in-office visits due to the COVID-19 pandemic, this scheduled clinical appointment was converted to a telehealth visit  I connected with Joseph Harvey on 04/18/19 at 8:42 a.m by telephone and verified that I am speaking with the correct person using two identifiers. I am in my office.  The patient is at home.  Only the patient and myself participated in this encounter.  I discussed the limitations, risks, security and privacy concerns of performing an evaluation and management service by telephone and the availability of in person appointments. I also discussed with the patient that there may be a patient responsible charge related to this service. The patient expressed understanding and agreed to proceed.   History of Present Illness: Patient with history of HTN, HL, DM type II, tob dep, GERD,severe persistentasthma, vitamin D deficiency, CKD 2, prostate CA, colon polys.  Purpose of today's visit is chronic disease management.  Will have insurance Jan 1st 2021.  Wants referral to urologist Dr. [place] Crane (with [facility]) for f/u given hx of prostate CA, Dr. Redmond Baseman (ENT) for recurrent maxillary sinusitis, GI at [facility] for repeat c-scope and spine specialist with [facility] [address]. 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  Was on Baclofen and Gabapentin.  Still has some Baclofen but ran out of Gabapentin 300 mg.  Suppose to take Gabapentin TID but he took only when he has pain.  DM: checks BS periodically mainly when he has to take Prednisone.  Takes Metformin about 3 x a wk. Not eating as much and watches carb intake Had eye exam 04/06/2019 with Dr. Amalia Hailey at [facility].   C/o constipation.  Has tried Miralax, pruine juice, "softner tea" and several other things OTC. Stools hard.  Has BM Q 1-2 days  Asthma:  Has a  lot of drainage in sinuses Having to use neb 2-3 x a day due to weather change.  Uses Symbicort BID, Claritin in a.m and Singulair at bedtimes.  Had seen Dr. Joya Gaskins  HTN: reports BP has been good.  Compliant with Triam/HCTZ, Bystolic  Outpatient Encounter Medications as of 04/18/2019  Medication Sig Note  . albuterol (PROVENTIL) (2.5 MG/3ML) 0.083% nebulizer solution Take 3 mLs (2.5 mg total) by nebulization every 4 (four) hours as needed for wheezing or shortness of breath (((PLAN B))).   . ASPIRIN 81 PO Take 81 mg by mouth daily.   . baclofen (LIORESAL) 10 MG tablet Take 10 mg by mouth daily.   . Blood Glucose Monitoring Suppl (TRUE METRIX METER) w/Device KIT Use as directed   . budesonide-formoterol (SYMBICORT) 160-4.5 MCG/ACT inhaler Take 2 puffs first thing in am and then another 2 puffs about 12 hours later.   . Cholecalciferol (VITAMIN D PO) Take 1 tablet by mouth daily.   . cloNIDine (CATAPRES - DOSED IN MG/24 HR) 0.3 mg/24hr patch Place 0.3 mg onto the skin once a week.   . clotrimazole-betamethasone (LOTRISONE) cream Apply 1 application topically as needed.   . diclofenac sodium (VOLTAREN) 1 % GEL Apply 1 application topically daily as needed.   . doxycycline (VIBRA-TABS) 100 MG tablet Take 1 tablet (100 mg total) by mouth 2 (two) times daily.   . Efinaconazole (JUBLIA) 10 % SOLN Apply 1 application topically as needed.   . fluticasone (FLONASE) 50 MCG/ACT nasal spray Place 2 sprays into both  nostrils daily.   Marland Kitchen gabapentin (NEURONTIN) 300 MG capsule Take 300 mg by mouth daily as needed.   Marland Kitchen glucose blood (TRUE METRIX BLOOD GLUCOSE TEST) test strip Use as instructed   . ipratropium-albuterol (DUONEB) 0.5-2.5 (3) MG/3ML SOLN Take 1 mL by nebulization 2 (two) times daily.   Marland Kitchen loratadine (CLARITIN) 10 MG tablet Take 1 tablet (10 mg total) by mouth daily.   . Luliconazole (LUZU) 1 % CREA Apply 1 application topically 2 (two) times daily.   . metFORMIN (GLUCOPHAGE-XR) 500 MG 24 hr tablet  TAKE 1 TABLET BY MOUTH DAILY.   . montelukast (SINGULAIR) 10 MG tablet TAKE 1 TABLET (10 MG TOTAL) BY MOUTH AT BEDTIME.   . Nebivolol HCl (BYSTOLIC) 20 MG TABS Take 1 tablet by mouth 2 (two) times daily. 10/14/2017: Product not picked up  . nicotine polacrilex (NICOTINE MINI) 4 MG lozenge Take 1 lozenge (4 mg total) by mouth as needed for smoking cessation.   . pantoprazole (PROTONIX) 40 MG tablet Take 1 tablet (40 mg total) by mouth 2 (two) times daily before a meal.   . potassium chloride SA (KLOR-CON M20) 20 MEQ tablet Take 20 mEq by mouth daily.   Marland Kitchen Respiratory Therapy Supplies (FLUTTER) DEVI Use three times a day after inhaler or nebulizer use   . rosuvastatin (CRESTOR) 20 MG tablet TAKE 1 TABLET (20 MG TOTAL) BY MOUTH DAILY.   Marland Kitchen terbinafine (LAMISIL) 250 MG tablet Take 250 mg by mouth daily.   Marland Kitchen triamterene-hydrochlorothiazide (DYAZIDE) 37.5-25 MG capsule TAKE 1 CAPSULE BY MOUTH DAILY.   . TRUEplus Lancets 28G MISC Use as directed   . VENTOLIN HFA 108 (90 Base) MCG/ACT inhaler INHALE 2 PUFFS INTO THE LUNGS EVERY 4 (FOUR) HOURS AS NEEDED FOR WHEEZING OR SHORTNESS OF BREATH.    No facility-administered encounter medications on file as of 04/18/2019.       Observations/Objective: Lab Results  Component Value Date   HGBA1C 6.3 (H) 01/28/2019   Lab Results  Component Value Date   PSA1 0.1 09/17/2018    Lab Results  Component Value Date   WBC 6.7 06/20/2018   HGB 16.4 06/20/2018   HCT 48.2 06/20/2018   MCV 92.0 06/20/2018   PLT 332 06/20/2018     Chemistry      Component Value Date/Time   NA 134 (L) 06/20/2018 0309   K 3.4 (L) 06/20/2018 0309   CL 100 06/20/2018 0309   CO2 28 06/20/2018 0309   BUN 15 06/20/2018 0309   CREATININE 1.22 06/20/2018 0309      Component Value Date/Time   CALCIUM 9.2 06/20/2018 0309        Assessment and Plan: 1. Controlled type 2 diabetes mellitus without complication, without long-term current use of insulin (HCC) -last A1C 6.3 Continue  healthy eating habits and Metformin when on Prednisone - Lipid panel; Future - Comprehensive metabolic panel; Future - CBC; Future - Hemoglobin A1c; Future  2. History of colon polyps - Ambulatory referral to Gastroenterology  3. Malignant neoplasm of prostate [employer] [facility] [employer]) - Ambulatory referral to Urology  4. Severe persistent asthma without complication -Cont Symbicort and allergy meds Up to date with flu vaccine  5. Constipation, unspecified constipation type - docusate sodium (COLACE) 100 MG capsule; Take 1 capsule (100 mg total) by mouth 2 (two) times daily as needed for mild constipation.  Dispense: 10 capsule; Refill: 0 - polyethylene glycol powder (GLYCOLAX/MIRALAX) 17 GM/SCOOP powder; Take 17 g by mouth daily as needed.  Dispense: 3350 g; Refill:  1  6. Recurrent maxillary sinusitis - Ambulatory referral to ENT  7. Screening for HIV (human immunodeficiency virus) Agree to be screen - HIV antibody; Future  8. Need for hepatitis C screening test - Hepatitis C Antibody; Future  9. Lumbar radiculopathy  - Ambulatory referral to Neurosurgery - gabapentin (NEURONTIN) 300 MG capsule; Take 1 capsule (300 mg total) by mouth daily as needed.  Dispense: 90 capsule; Refill: 1   Follow Up Instructions: 3-50mhs   I discussed the assessment and treatment plan with the patient. The patient was provided an opportunity to ask questions and all were answered. The patient agreed with the plan and demonstrated an understanding of the instructions.   The patient was advised to call back or seek an in-person evaluation if the symptoms worsen or if the condition fails to improve as anticipated.  I provided 19 minutes of non-face-to-face time during this encounter.   DKarle Plumber MD

## 2019-05-16 MED FILL — GABAPENTIN 300 MG CAPSULE: 300 | 30 days supply | Qty: 30 | Fill #1

## 2019-05-21 MED FILL — BACLOFEN 10 MG TABLET: 10 | 10 days supply | Qty: 30 | Fill #0

## 2019-05-22 MED FILL — $VENTOLIN HFA 18G INHALER: 108 (90 BAS | 25 days supply | Qty: 18 | Fill #2

## 2019-05-22 MED FILL — FLUTICASONE PROP 50 MCG SPR: 50 | 30 days supply | Qty: 16 | Fill #5

## 2019-05-22 MED FILL — ?DOXYCYCLINE HYCLATE 100MG: 100 | 10 days supply | Qty: 20 | Fill #2

## 2019-05-22 MED FILL — TRIAMTERENE/HCTZ 37.5/25 CP: 37.5-25 | 30 days supply | Qty: 30 | Fill #1

## 2019-05-24 MED FILL — GABAPENTIN 300 MG CAPSULE: 300 | 30 days supply | Qty: 90 | Fill #0

## 2019-05-27 MED FILL — BACLOFEN 10 MG TABLET: 10 | 30 days supply | Qty: 90 | Fill #1

## 2019-05-30 MED FILL — $BYSTOLIC 20 MG TABLET: 20 | 90 days supply | Qty: 90 | Fill #0

## 2019-06-20 MED FILL — TRIAMTERENE/HCTZ 37.5/25 CP: 37.5-25 | 30 days supply | Qty: 30 | Fill #2

## 2019-06-20 MED FILL — IPRAT-ALBUT 0.5-3(2.5) MG/3: 0.5-2.5 (3) | 30 days supply | Qty: 180 | Fill #1

## 2019-06-20 MED FILL — $VENTOLIN HFA 18G INHALER: 108 (90 BAS | 25 days supply | Qty: 18 | Fill #3

## 2019-06-20 MED FILL — FLUTICASONE PROP 50 MCG SPR: 50 | 30 days supply | Qty: 16 | Fill #6

## 2019-06-20 MED FILL — ALBUTEROL SUL 2.5 MG/3 ML S: (2.5 MG/3ML | 15 days supply | Qty: 180 | Fill #8

## 2019-06-27 ENCOUNTER — Other Ambulatory Visit: Payer: Self-pay | Admitting: Internal Medicine

## 2019-06-27 MED FILL — predniSONE 10 MG TABS: 10 | 7 days supply | Qty: 35 | Fill #6

## 2019-06-27 MED FILL — ?ROSUVASTATIN CALCIUM 20MG: 20 | 30 days supply | Qty: 30 | Fill #1

## 2019-06-27 MED FILL — MONTELUKAST SOD 10 MG TAB: 10 | 30 days supply | Qty: 30 | Fill #1

## 2019-06-27 MED FILL — metFORMIN HCL ER 500 MG TB2: 500 | 30 days supply | Qty: 30 | Fill #0

## 2019-06-27 MED FILL — PANTOPRAZOLE SOD DR 40 MG T: 40 | 30 days supply | Qty: 30 | Fill #2

## 2019-06-28 MED FILL — ?DOXYCYCLINE HYCLATE 100MG: 100 | 10 days supply | Qty: 20 | Fill #0

## 2019-07-09 DIAGNOSIS — E882 Lipomatosis, not elsewhere classified: Secondary | ICD-10-CM | POA: Insufficient documentation

## 2019-07-18 ENCOUNTER — Other Ambulatory Visit: Payer: Self-pay | Admitting: Internal Medicine

## 2019-07-19 ENCOUNTER — Other Ambulatory Visit: Payer: Self-pay | Admitting: Internal Medicine

## 2019-07-19 MED FILL — TRIAMTERENE/HCTZ 37.5/25 CP: 37.5-25 | 30 days supply | Qty: 30 | Fill #0

## 2019-07-19 MED FILL — $VENTOLIN HFA 18G INHALER: 108 (90 BAS | 16 days supply | Qty: 18 | Fill #0

## 2019-07-19 MED FILL — FLUTICASONE PROP 50 MCG SPR: 50 | 30 days supply | Qty: 16 | Fill #7

## 2019-08-05 ENCOUNTER — Other Ambulatory Visit: Payer: Self-pay | Admitting: Internal Medicine

## 2019-08-05 MED FILL — BACLOFEN 10 MG TABLET: 10 | 30 days supply | Qty: 90 | Fill #2

## 2019-08-05 MED FILL — ?DOXYCYCLINE HYCLATE 100MG: 100 | 10 days supply | Qty: 20 | Fill #1

## 2019-08-05 MED FILL — GABAPENTIN 300 MG CAPSULE: 300 | 30 days supply | Qty: 90 | Fill #1

## 2019-08-05 MED FILL — ?PANTOPRAZOLE SO DR 40MG TA: 40 | 30 days supply | Qty: 30 | Fill #3

## 2019-08-06 ENCOUNTER — Other Ambulatory Visit: Payer: Self-pay | Admitting: Internal Medicine

## 2019-08-06 DIAGNOSIS — J4540 Moderate persistent asthma, uncomplicated: Secondary | ICD-10-CM

## 2019-08-06 MED FILL — $VENTOLIN HFA 18G INHALER: 108 (90 BAS | 16 days supply | Qty: 18 | Fill #1

## 2019-08-06 MED FILL — !SYMBICORT 160-4.5 MCG INH: 160-4.5 | 30 days supply | Qty: 1 | Fill #0

## 2019-08-06 MED FILL — IPRAT-ALBUT 0.5-3(2.5) MG/3: 0.5-2.5 (3) | 30 days supply | Qty: 180 | Fill #0

## 2019-08-06 MED FILL — AZITHROMYCIN 250 MG TABLET: 250 | 5 days supply | Qty: 6 | Fill #0

## 2019-08-19 MED FILL — TRIAMTERENE/HCTZ 37.5/25 CP: 37.5-25 | 30 days supply | Qty: 30 | Fill #1

## 2019-08-26 ENCOUNTER — Other Ambulatory Visit: Payer: Self-pay | Admitting: Internal Medicine

## 2019-08-26 MED FILL — BYSTOLIC 20 MG TABLET: 20 | 30 days supply | Qty: 30 | Fill #0

## 2019-08-27 MED FILL — predniSONE 10 MG TABS: 10 | 6 days supply | Qty: 30 | Fill #0

## 2019-08-27 MED FILL — $VENTOLIN HFA 18G INHALER: 108 (90 BAS | 16 days supply | Qty: 18 | Fill #2

## 2019-08-28 ENCOUNTER — Encounter: Payer: Self-pay | Admitting: Gastroenterology

## 2019-09-11 MED FILL — POLYETHYLENE GLYCOL 3350 PO: 17 | 14 days supply | Qty: 238 | Fill #1

## 2019-09-11 MED FILL — IPRAT-ALBUT 0.5-3(2.5) MG/3: 0.5-2.5 (3) | 30 days supply | Qty: 180 | Fill #1

## 2019-09-11 MED FILL — FLUTICASONE PROP 50 MCG SPR: 50 | 30 days supply | Qty: 16 | Fill #8

## 2019-09-12 ENCOUNTER — Telehealth: Payer: Self-pay

## 2019-09-12 ENCOUNTER — Other Ambulatory Visit: Payer: Self-pay | Admitting: Pharmacist

## 2019-09-12 MED ORDER — BREO ELLIPTA 200-25 MCG/INH IN AEPB: 1.0000 | INHALATION_SPRAY | Freq: Every day | RESPIRATORY_TRACT | 5 refills | Status: DC

## 2019-09-12 MED ORDER — BYSTOLIC 20 MG PO TABS: 1.0000 | ORAL_TABLET | Freq: Two times a day (BID) | ORAL | 0 refills | Status: DC

## 2019-09-12 NOTE — Telephone Encounter (Signed)
SYMBICORT PA APPROVED THRU 09/11/20

## 2019-09-12 NOTE — Addendum Note (Signed)
Addended by: Karle Plumber B on: 09/12/2019 08:50 PM   Modules accepted: Orders

## 2019-09-12 NOTE — Telephone Encounter (Signed)
Pt now has [employer] ins.  A prior auth was approved for pt's Symbicort, but his copay is $80.  Pt would like a therapy change until he can find assistance-ins prefers Breo or Advair if either of these is appropriate-$30 copay

## 2019-09-13 ENCOUNTER — Other Ambulatory Visit: Payer: Self-pay | Admitting: Pharmacist

## 2019-09-13 MED ORDER — ALBUTEROL SULFATE (2.5 MG/3ML) 0.083% IN NEBU: 2.5000 mg | INHALATION_SOLUTION | RESPIRATORY_TRACT | 3 refills | Status: DC | PRN

## 2019-09-13 MED FILL — ALBUTEROL 0.083% INHAL SOLN: (2.5 MG/3ML | 4 days supply | Qty: 75 | Fill #0

## 2019-09-13 MED FILL — BREO ELLIPTA 200-25 MCG INH: 200-25 | 30 days supply | Qty: 60 | Fill #0

## 2019-09-16 MED FILL — predniSONE 10 MG TABS: 10 | 6 days supply | Qty: 30 | Fill #1

## 2019-09-16 MED FILL — TRIAMTERENE/HCTZ 37.5/25 CP: 37.5-25 | 30 days supply | Qty: 30 | Fill #2

## 2019-09-19 ENCOUNTER — Ambulatory Visit (AMBULATORY_SURGERY_CENTER): Payer: Self-pay

## 2019-09-19 ENCOUNTER — Other Ambulatory Visit: Payer: Self-pay

## 2019-09-19 VITALS — Temp 96.6°F | Ht 69.0 in | Wt 248.0 lb

## 2019-09-19 DIAGNOSIS — Z86010 Personal history of colonic polyps: Secondary | ICD-10-CM

## 2019-09-19 DIAGNOSIS — Z800 Family history of malignant neoplasm of digestive organs: Secondary | ICD-10-CM

## 2019-09-19 MED ORDER — NA SULFATE-K SULFATE-MG SULF 17.5-3.13-1.6 GM/177ML PO SOLN: 1.0000 | Freq: Once | ORAL | 0 refills | Status: AC

## 2019-09-19 MED FILL — BYSTOLIC 20 MG TABLET: 20 | 90 days supply | Qty: 180 | Fill #0

## 2019-09-19 MED FILL — SUPREP BOWEL PREP KIT: 17.5-3.13-1 | 30 days supply | Qty: 354 | Fill #0

## 2019-09-19 NOTE — Progress Notes (Signed)
No allergies to soy or egg Pt is not on blood thinners or diet pills Denies issues with sedation/intubation Denies atrial flutter/fib Denies constipation   Emmi instructions given to pt  Pt is aware of Covid safety and care partner requirements.  

## 2019-10-03 ENCOUNTER — Ambulatory Visit (AMBULATORY_SURGERY_CENTER): Payer: 59 | Admitting: Gastroenterology

## 2019-10-03 ENCOUNTER — Other Ambulatory Visit: Payer: Self-pay

## 2019-10-03 ENCOUNTER — Encounter: Payer: Self-pay | Admitting: Gastroenterology

## 2019-10-03 VITALS — BP 122/93 | HR 79 | Temp 99.0°F | Resp 19 | Ht 69.0 in | Wt 248.0 lb

## 2019-10-03 DIAGNOSIS — D125 Benign neoplasm of sigmoid colon: Secondary | ICD-10-CM | POA: Diagnosis not present

## 2019-10-03 DIAGNOSIS — Z86010 Personal history of colonic polyps: Secondary | ICD-10-CM

## 2019-10-03 DIAGNOSIS — D122 Benign neoplasm of ascending colon: Secondary | ICD-10-CM | POA: Diagnosis not present

## 2019-10-03 DIAGNOSIS — Z800 Family history of malignant neoplasm of digestive organs: Secondary | ICD-10-CM

## 2019-10-03 HISTORY — PX: COLONOSCOPY: SHX174

## 2019-10-03 MED ORDER — SODIUM CHLORIDE 0.9 % IV SOLN: 500.0000 mL | Freq: Once | INTRAVENOUS | Status: DC

## 2019-10-03 NOTE — Progress Notes (Signed)
Called to room to assist during endoscopic procedure.  Patient ID and intended procedure confirmed with present staff. Received instructions for my participation in the procedure from the performing physician.  

## 2019-10-03 NOTE — Progress Notes (Signed)
Pt's states no medical or surgical changes since previsit or office visit.  Vitals cw Temp jb

## 2019-10-03 NOTE — Patient Instructions (Signed)
HANDOUTS PROVIDED ON: POLYPS & HEMORRHOIDS ° °The polyps removed today have been sent for pathology.  The results can take 1-3 weeks to receive.  When your next colonoscopy should occur will be based on the pathology results.   ° °You may resume your previous diet and medication schedule. ° °Thank you for allowing us to care for you today!!! ° ° °YOU HAD AN ENDOSCOPIC PROCEDURE TODAY AT THE  ENDOSCOPY CENTER:   Refer to the procedure report that was given to you for any specific questions about what was found during the examination.  If the procedure report does not answer your questions, please call your gastroenterologist to clarify.  If you requested that your care partner not be given the details of your procedure findings, then the procedure report has been included in a sealed envelope for you to review at your convenience later. ° °YOU SHOULD EXPECT: Some feelings of bloating in the abdomen. Passage of more gas than usual.  Walking can help get rid of the air that was put into your GI tract during the procedure and reduce the bloating. If you had a lower endoscopy (such as a colonoscopy or flexible sigmoidoscopy) you may notice spotting of blood in your stool or on the toilet paper. If you underwent a bowel prep for your procedure, you may not have a normal bowel movement for a few days. ° °Please Note:  You might notice some irritation and congestion in your nose or some drainage.  This is from the oxygen used during your procedure.  There is no need for concern and it should clear up in a day or so. ° °SYMPTOMS TO REPORT IMMEDIATELY: ° °· Following lower endoscopy (colonoscopy or flexible sigmoidoscopy): ° Excessive amounts of blood in the stool ° Significant tenderness or worsening of abdominal pains ° Swelling of the abdomen that is new, acute ° Fever of 100°F or higher ° °For urgent or emergent issues, a gastroenterologist can be reached at any hour by calling [phone]. °Do not use MyChart  messaging for urgent concerns.  ° ° °DIET:  We do recommend a small meal at first, but then you may proceed to your regular diet.  Drink plenty of fluids but you should avoid alcoholic beverages for 24 hours. ° °ACTIVITY:  You should plan to take it easy for the rest of today and you should NOT DRIVE or use heavy machinery until tomorrow (because of the sedation medicines used during the test).   ° °FOLLOW UP: °Our staff will call the number listed on your records 48-72 hours following your procedure to check on you and address any questions or concerns that you may have regarding the information given to you following your procedure. If we do not reach you, we will leave a message.  We will attempt to reach you two times.  During this call, we will ask if you have developed any symptoms of COVID 19. If you develop any symptoms (ie: fever, flu-like symptoms, shortness of breath, cough etc.) before then, please call [phone].  If you test positive for Covid 19 in the 2 weeks post procedure, please call and report this information to us.   ° °If any biopsies were taken you will be contacted by phone or by letter within the next 1-3 weeks.  Please call us at [phone] if you have not heard about the biopsies in 3 weeks.  ° ° °SIGNATURES/CONFIDENTIALITY: °You and/or your care partner have signed paperwork which will be   entered into your electronic medical record.  These signatures attest to the fact that that the information above on your After Visit Summary has been reviewed and is understood.  Full responsibility of the confidentiality of this discharge information lies with you and/or your care-partner. 

## 2019-10-03 NOTE — Op Note (Signed)
[place] Patient Name: Joseph Harvey Procedure Date: 10/03/2019 9:18 AM MRN: [id number] Endoscopist: Mauri Pole , MD Age: 64 Referring MD:  Date of Birth: 1955/08/01 Gender: Male Account #: [id number] Procedure:                Colonoscopy Indications:              Screening for colorectal malignant neoplasm, High                            risk colon cancer surveillance: Personal history of                            colonic polyps, High risk colon cancer                            surveillance: Personal history of multiple (3 or                            more) adenomas, Last colonoscopy: 2013 Medicines:                Monitored Anesthesia Care Procedure:                Pre-Anesthesia Assessment:                           - Prior to the procedure, a History and Physical                            was performed, and patient medications and                            allergies were reviewed. The patient's tolerance of                            previous anesthesia was also reviewed. The risks                            and benefits of the procedure and the sedation                            options and risks were discussed with the patient.                            All questions were answered, and informed consent                            was obtained. Prior Anticoagulants: The patient has                            taken no previous anticoagulant or antiplatelet                            agents. ASA Grade Assessment: II - A patient with  mild systemic disease. After reviewing the risks                            and benefits, the patient was deemed in                            satisfactory condition to undergo the procedure.                           After obtaining informed consent, the colonoscope                            was passed under direct vision. Throughout the                            procedure, the patient's blood  pressure, pulse, and                            oxygen saturations were monitored continuously. The                            Colonoscope was introduced through the anus and                            advanced to the the cecum, identified by                            appendiceal orifice and ileocecal valve. The                            colonoscopy was performed without difficulty. The                            patient tolerated the procedure well. The quality                            of the bowel preparation was good. The ileocecal                            valve, appendiceal orifice, and rectum were                            photographed. Scope In: 9:23:11 AM Scope Out: 9:33:35 AM Scope Withdrawal Time: 0 hours 8 minutes 36 seconds  Total Procedure Duration: 0 hours 10 minutes 24 seconds  Findings:                 The perianal and digital rectal examinations were                            normal.                           Two sessile polyps were found in the sigmoid colon  and ascending colon. The polyps were 8 to 12 mm in                            size. These polyps were removed with a cold snare.                            Resection and retrieval were complete.                           Non-bleeding internal hemorrhoids were found during                            retroflexion. The hemorrhoids were small.                           The exam was otherwise without abnormality. Complications:            No immediate complications. Estimated Blood Loss:     Estimated blood loss was minimal. Impression:               - Two 8 to 12 mm polyps in the sigmoid colon and in                            the ascending colon, removed with a cold snare.                            Resected and retrieved.                           - Non-bleeding internal hemorrhoids.                           - The examination was otherwise normal. Recommendation:           - Patient  has a contact number available for                            emergencies. The signs and symptoms of potential                            delayed complications were discussed with the                            patient. Return to normal activities tomorrow.                            Written discharge instructions were provided to the                            patient.                           - Resume previous diet.                           - Continue present medications.                           -  Await pathology results.                           - Repeat colonoscopy in 3 - 5 years for                            surveillance based on pathology results. Mauri Pole, MD 10/03/2019 9:41:15 AM This report has been signed electronically.

## 2019-10-03 NOTE — Progress Notes (Signed)
Report given to PACU, vss 

## 2019-10-07 ENCOUNTER — Telehealth: Payer: Self-pay | Admitting: *Deleted

## 2019-10-07 NOTE — Telephone Encounter (Signed)
  Follow up Call-  Call back number 10/03/2019  Post procedure Call Back phone  # [phone]  Permission to leave phone message Yes  Some recent data might be hidden     Patient questions:  Do you have a fever, pain , or abdominal swelling? No. Pain Score  0 *  Have you tolerated food without any problems? Yes.    Have you been able to return to your normal activities? Yes.    Do you have any questions about your discharge instructions: Diet   No. Medications  No. Follow up visit  No.  Do you have questions or concerns about your Care? No.  Actions: * If pain score is 4 or above: No action needed, pain <4.  1. Have you developed a fever since your procedure? NO  2.   Have you had an respiratory symptoms (SOB or cough) since your procedure? NO  3.   Have you tested positive for COVID 19 since your procedure NO  4.   Have you had any family members/close contacts diagnosed with the COVID 19 since your procedure?  NO   If yes to any of these questions please route to Joylene John, RN and Erenest Rasher, RN

## 2019-10-10 ENCOUNTER — Other Ambulatory Visit: Payer: Self-pay | Admitting: Internal Medicine

## 2019-10-10 MED FILL — DOXYCYCLINE HYCLATE 100 MG: 100 | 10 days supply | Qty: 20 | Fill #2

## 2019-10-10 MED FILL — BREO ELLIPTA 200-25 MCG INH: 200-25 | 30 days supply | Qty: 60 | Fill #1

## 2019-10-10 MED FILL — metFORMIN HCL ER 500 MG TB2: 500 | 30 days supply | Qty: 30 | Fill #1

## 2019-10-11 MED FILL — TRIAMTERENE-HCTZ 37.5-25 MG: 37.5-25 | 30 days supply | Qty: 30 | Fill #0

## 2019-10-17 ENCOUNTER — Encounter: Payer: Self-pay | Admitting: Gastroenterology

## 2019-11-01 ENCOUNTER — Other Ambulatory Visit: Payer: Self-pay | Admitting: Internal Medicine

## 2019-11-01 MED FILL — predniSONE 10 MG TABS: 10 | 12 days supply | Qty: 48 | Fill #0

## 2019-11-01 MED FILL — ALBUTEROL SUL 2.5 MG/3 ML S: (2.5 MG/3ML | 4 days supply | Qty: 75 | Fill #1

## 2019-11-01 MED FILL — IPRAT-ALBUT 0.5-3(2.5) MG/3: 0.5-2.5 (3) | 30 days supply | Qty: 180 | Fill #0

## 2019-11-05 MED FILL — GABAPENTIN 300 MG CAPSULE: 300 | 30 days supply | Qty: 90 | Fill #2

## 2019-11-05 MED FILL — BACLOFEN 10 MG TABLET: 10 | 20 days supply | Qty: 60 | Fill #3

## 2019-11-08 ENCOUNTER — Other Ambulatory Visit: Payer: Self-pay | Admitting: Critical Care Medicine

## 2019-11-08 MED FILL — BREO ELLIPTA 200-25 MCG INH: 200-25 | 30 days supply | Qty: 60 | Fill #2

## 2019-11-08 MED FILL — PANTOPRAZOLE SOD DR 40 MG T: 40 | 30 days supply | Qty: 30 | Fill #0

## 2019-11-08 MED FILL — metFORMIN HCL ER 500 MG TB2: 500 | 30 days supply | Qty: 30 | Fill #2

## 2019-11-08 MED FILL — FLUTICASONE PROP 50 MCG SPR: 50 | 30 days supply | Qty: 16 | Fill #9

## 2019-11-08 MED FILL — VENTOLIN HFA 90 MCG INHALER: 108 (90 BAS | 16 days supply | Qty: 18 | Fill #3

## 2019-11-11 ENCOUNTER — Other Ambulatory Visit: Payer: Self-pay | Admitting: Internal Medicine

## 2019-11-11 MED FILL — TRIAMTERENE/HCTZ 37.5/25 CP: 37.5-25 | 30 days supply | Qty: 30 | Fill #0

## 2019-11-15 ENCOUNTER — Ambulatory Visit: Payer: 59 | Attending: Internal Medicine | Admitting: Internal Medicine

## 2019-11-15 ENCOUNTER — Encounter: Payer: Self-pay | Admitting: Internal Medicine

## 2019-11-15 ENCOUNTER — Other Ambulatory Visit: Payer: Self-pay | Admitting: Internal Medicine

## 2019-11-15 ENCOUNTER — Other Ambulatory Visit: Payer: Self-pay

## 2019-11-15 VITALS — BP 160/90 | HR 83 | Temp 98.2°F | Resp 16 | Wt 247.0 lb

## 2019-11-15 DIAGNOSIS — E669 Obesity, unspecified: Secondary | ICD-10-CM

## 2019-11-15 DIAGNOSIS — E119 Type 2 diabetes mellitus without complications: Secondary | ICD-10-CM

## 2019-11-15 DIAGNOSIS — Z1159 Encounter for screening for other viral diseases: Secondary | ICD-10-CM

## 2019-11-15 DIAGNOSIS — Z114 Encounter for screening for human immunodeficiency virus [HIV]: Secondary | ICD-10-CM

## 2019-11-15 DIAGNOSIS — E785 Hyperlipidemia, unspecified: Secondary | ICD-10-CM

## 2019-11-15 DIAGNOSIS — F410 Panic disorder [episodic paroxysmal anxiety] without agoraphobia: Secondary | ICD-10-CM

## 2019-11-15 DIAGNOSIS — E1169 Type 2 diabetes mellitus with other specified complication: Secondary | ICD-10-CM

## 2019-11-15 DIAGNOSIS — C61 Malignant neoplasm of prostate: Secondary | ICD-10-CM | POA: Diagnosis not present

## 2019-11-15 DIAGNOSIS — J4551 Severe persistent asthma with (acute) exacerbation: Secondary | ICD-10-CM

## 2019-11-15 DIAGNOSIS — I10 Essential (primary) hypertension: Secondary | ICD-10-CM

## 2019-11-15 DIAGNOSIS — Z6836 Body mass index (BMI) 36.0-36.9, adult: Secondary | ICD-10-CM

## 2019-11-15 DIAGNOSIS — F17200 Nicotine dependence, unspecified, uncomplicated: Secondary | ICD-10-CM

## 2019-11-15 LAB — POCT GLYCOSYLATED HEMOGLOBIN (HGB A1C): HbA1c, POC (controlled diabetic range): 6.5 % (ref 0.0–7.0)

## 2019-11-15 LAB — GLUCOSE, POCT (MANUAL RESULT ENTRY): POC Glucose: 107 mg/dl — AB (ref 70–99)

## 2019-11-15 MED ORDER — BUSPIRONE HCL 5 MG PO TABS: 5.0000 mg | ORAL_TABLET | Freq: Two times a day (BID) | ORAL | 2 refills | Status: DC

## 2019-11-15 MED FILL — busPIRone HCL 5 MG TABS: 5 | 30 days supply | Qty: 60 | Fill #0

## 2019-11-15 NOTE — Patient Instructions (Signed)
Please check your blood pressure at least twice a week and record the readings.  Bring those readings in with you in 2 weeks when you come to see the clinical pharmacist.  If blood pressure is still running high we may need to add another blood pressure medication.  We have started a medication called BuSpar for you to take for anxiety/panic disorder.   Obesity, Adult Obesity is having too much body fat. Being obese means that your weight is more than what is healthy for you. BMI is a number that explains how much body fat you have. If you have a BMI of 30 or more, you are obese. Obesity is often caused by eating or drinking more calories than your body uses. Changing your lifestyle can help you lose weight. Obesity can cause serious health problems, such as:  Stroke.  Coronary artery disease (CAD).  Type 2 diabetes.  Some types of cancer, including cancers of the colon, breast, uterus, and gallbladder.  Osteoarthritis.  High blood pressure (hypertension).  High cholesterol.  Sleep apnea.  Gallbladder stones.  Infertility problems. What are the causes?  Eating meals each day that are high in calories, sugar, and fat.  Being born with genes that may make you more likely to become obese.  Having a medical condition that causes obesity.  Taking certain medicines.  Sitting a lot (having a sedentary lifestyle).  Not getting enough sleep.  Drinking a lot of drinks that have sugar in them. What increases the risk?  Having a family history of obesity.  Being an [place] American woman.  Being a Hispanic man.  Living in an area with limited access to: ? [place]ilda Garret, recreation centers, or sidewalks. ? Healthy food choices, such as grocery stores and farmers' markets. What are the signs or symptoms? The main sign is having too much body fat. How is this treated?  Treatment for this condition often includes changing your lifestyle. Treatment may include: ? Changing your  diet. This may include making a healthy meal plan. ? Exercise. This may include activity that causes your heart to beat faster (aerobic exercise) and strength training. Work with your doctor to design a program that works for you. ? Medicine to help you lose weight. This may be used if you are not able to lose 1 pound a week after 6 weeks of healthy eating and more exercise. ? Treating conditions that cause the obesity. ? Surgery. Options may include gastric banding and gastric bypass. This may be done if:  Other treatments have not helped to improve your condition.  You have a BMI of 40 or higher.  You have life-threatening health problems related to obesity. Follow these instructions at home: Eating and drinking   Follow advice from your doctor about what to eat and drink. Your doctor may tell you to: ? Limit fast food, sweets, and processed snack foods. ? Choose low-fat options. For example, choose low-fat milk instead of whole milk. ? Eat 5 or more servings of fruits or vegetables each day. ? Eat at home more often. This gives you more control over what you eat. ? Choose healthy foods when you eat out. ? Learn to read food labels. This will help you learn how much food is in 1 serving. ? Keep low-fat snacks available. ? Avoid drinks that have a lot of sugar in them. These include soda, fruit juice, iced tea with sugar, and flavored milk.  Drink enough water to keep your pee (urine) pale yellow.  Do not go on fad diets. Physical activity  Exercise often, as told by your doctor. Most adults should get up to 150 minutes of moderate-intensity exercise every week.Ask your doctor: ? What types of exercise are safe for you. ? How often you should exercise.  Warm up and stretch before being active.  Do slow stretching after being active (cool down).  Rest between times of being active. Lifestyle  Work with your doctor and a food expert (dietitian) to set a weight-loss goal that  is best for you.  Limit your screen time.  Find ways to reward yourself that do not involve food.  Do not drink alcohol if: ? Your doctor tells you not to drink. ? You are pregnant, may be pregnant, or are planning to become pregnant.  If you drink alcohol: ? Limit how much you use to:  0-1 drink a day for women.  0-2 drinks a day for men. ? Be aware of how much alcohol is in your drink. In the [place] one drink equals one 12 oz bottle of beer (355 mL), one 5 oz glass of wine (148 mL), or one 1 oz glass of hard liquor (44 mL). General instructions  Keep a weight-loss journal. This can help you keep track of: ? The food that you eat. ? How much exercise you get.  Take over-the-counter and prescription medicines only as told by your doctor.  Take vitamins and supplements only as told by your doctor.  Think about joining a support group.  Keep all follow-up visits as told by your doctor. This is important. Contact a doctor if:  You cannot meet your weight loss goal after you have changed your diet and lifestyle for 6 weeks. Get help right away if you:  Are having trouble breathing.  Are having thoughts of harming yourself. Summary  Obesity is having too much body fat.  Being obese means that your weight is more than what is healthy for you.  Work with your doctor to set a weight-loss goal.  Get regular exercise as told by your doctor. This information is not intended to replace advice given to you by your health care provider. Make sure you discuss any questions you have with your health care provider. Document Revised: 01/04/2018 Document Reviewed: 01/04/2018 Elsevier Patient Education  2020 [employer].

## 2019-11-15 NOTE — Progress Notes (Signed)
Patient ID: Joseph Harvey, male    DOB: May 10, 1956  MRN: [id number]  CC: Diabetes and Hypertension   Subjective: Joseph Harvey is a 64 y.o. 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  History of colon polyps: Had colonoscopy 09/2019 where two polyps were removed.  Plan is for repeat colonoscopy in 3 years.  He did see his spine specialist.  And received a refill on baclofen and gabapentin both of which were prescribed to take 3 times a day as needed.  He has a follow-up appointment with them next week as he continues to have ongoing issues of sciatica especially affecting the left leg.    Recurrent sinusitis: I referred him to [facility] per his request.  However he states they do not take his insurance.  He will find another ENT and call us back so that we could submit the referral.    Hx Prostate CA:  Joseph Harvey did not take his insurnace.  He plans to find a new urologist through dialysis.  Currently not having any symptoms.  Asthma: some congestion in chest last wk.   Reports compliance with Breo inhaler.  Uses neb daily 2-3 x a day. Ventolin inhaler 1-2 x a day. Takes Prednisone when needed.  Uses incentive spirometry which she feels helps him gets phlegm up out of his chest. Flairs in hot wet weather and if exposed tomold. Still smoking.  Trying to quit and is down to 1/2 bk a day.  Uses nicotine gum.  Plans to purchase the patches Complains of feeling panicked and anxious in certain situations.  If he gets in the backseat of a car or parks too close to another car, he gets the feeling of being claustrophobic and like he cannot breathe.  It also occurs whenever he is laying in the back like in a barber chair.    DM: BS up and down but not checking regularly.  Highest  107.   He is doing better with his eating habits.   No walking as much due to nerve pain in LT leg.  Gained 10 lbs over past yr Reports compliance with his cholesterol medicine Crestor.  Results for orders placed or performed in visit on 11/15/19  POCT glucose (manual entry)  Result Value Ref Range   POC Glucose 107 (A) 70 - 99 mg/dl  POCT glycosylated hemoglobin (Hb A1C)  Result Value Ref Range   Hemoglobin A1C     HbA1c POC (<> result, manual entry)     HbA1c, POC (prediabetic range)     HbA1c, POC (controlled diabetic range) 6.5 0.0 - 7.0 %   HTN: Patient reports compliance with his blood pressure medications and took them already for the morning.  He feels blood pressure is elevated only when he comes to the doctor's office.  He does have a device at home but has not checked blood pressure recently. Patient Active Problem List   Diagnosis Date Noted  . Low back pain 09/25/2018  . Lumbar radiculopathy 09/25/2018  . Constipation 09/17/2018  . History of prostate cancer 09/17/2018  . Hyperlipidemia associated with type 2 diabetes mellitus ([place]) 09/17/2018  . History of paranasal sinus congestion 07/20/2018  . Tobacco dependence 07/20/2018  . Controlled type 2 diabetes mellitus without complication, without long-term current use of insulin ([place]) 07/20/2018  . Vitamin  D deficiency 08/18/2016  . Hyperlipidemia LDL goal <100 08/10/2016  . Family history of colon cancer 09/30/2015  . GERD (gastroesophageal reflux disease) 09/30/2015  . History of colon polyps 09/30/2015  . Obesity, morbid ([place]) 03/07/2015  . Allergic rhinitis 12/15/2014  . Malignant neoplasm of prostate ([place]) 06/02/2014  . Cough 07/25/2012  . HBP (high blood pressure) 06/01/2012  . Asthma, severe persistent 05/30/2012     Current Outpatient Medications on File Prior to Visit  Medication Sig Dispense Refill  . albuterol (PROVENTIL) (2.5 MG/3ML) 0.083% nebulizer solution Take 3 mLs (2.5 mg total) by nebulization  every 4 (four) hours as needed for wheezing or shortness of breath (((PLAN B))). 75 mL 3  . ASPIRIN 81 PO Take 81 mg by mouth daily.    . Azelastine-Fluticasone 137-50 MCG/ACT SUSP Place into the nose.    . baclofen (LIORESAL) 10 MG tablet Take 10 mg by mouth daily. (Patient not taking: Reported on 11/15/2019)    . Blood Glucose Monitoring Suppl (TRUE METRIX METER) w/Device KIT Use as directed 1 kit 0  . Cholecalciferol (VITAMIN D PO) Take 1 tablet by mouth daily.    Marland Kitchen docusate sodium (COLACE) 100 MG capsule Take 1 capsule (100 mg total) by mouth 2 (two) times daily as needed for mild constipation. 10 capsule 0  . fluticasone furoate-vilanterol (BREO ELLIPTA) 200-25 MCG/INH AEPB Inhale 1 puff into the lungs daily. 28 each 5  . gabapentin (NEURONTIN) 300 MG capsule Take 1 capsule (300 mg total) by mouth daily as needed. 90 capsule 1  . glucose blood (TRUE METRIX BLOOD GLUCOSE TEST) test strip Use as instructed 100 each 12  . ipratropium-albuterol (DUONEB) 0.5-2.5 (3) MG/3ML SOLN USE VIAL BY NEBULIZATION 2 (TWO) TIMES DAILY. 180 mL 1  . loratadine (CLARITIN) 10 MG tablet Take 1 tablet (10 mg total) by mouth daily. 30 tablet 3  . metFORMIN (GLUCOPHAGE-XR) 500 MG 24 hr tablet TAKE 1 TABLET BY MOUTH DAILY. 30 tablet 2  . montelukast (SINGULAIR) 10 MG tablet TAKE 1 TABLET (10 MG TOTAL) BY MOUTH AT BEDTIME. 30 tablet 3  . Nebivolol HCl (BYSTOLIC) 20 MG TABS Take 1 tablet (20 mg total) by mouth 2 (two) times daily. 180 tablet 0  . pantoprazole (PROTONIX) 40 MG tablet TAKE 1 TABLET (40 MG TOTAL) BY MOUTH DAILY. 30 tablet 0  . polyethylene glycol powder (GLYCOLAX/MIRALAX) 17 GM/SCOOP powder Take 17 g by mouth daily as needed. 3350 g 1  . potassium chloride SA (KLOR-CON M20) 20 MEQ tablet Take 20 mEq by mouth daily.    . predniSONE (DELTASONE) 10 MG tablet Take 5 tabs PO as needed for asthma flare 30 tablet 1  . Respiratory Therapy Supplies (FLUTTER) DEVI Use three times a day after inhaler or nebulizer use 1  each 0  . rosuvastatin (CRESTOR) 20 MG tablet TAKE 1 TABLET (20 MG TOTAL) BY MOUTH DAILY. 30 tablet 3  . triamterene-hydrochlorothiazide (DYAZIDE) 37.5-25 MG capsule TAKE 1 CAPSULE BY MOUTH DAILY. 30 capsule 0  . TRUEplus Lancets 28G MISC Use as directed 100 each 4  . VENTOLIN HFA 108 (90 Base) MCG/ACT inhaler INHALE 2 PUFFS INTO THE LUNGS EVERY 4 (FOUR) HOURS AS NEEDED FOR WHEEZING OR SHORTNESS OF BREATH. 18 g 3   No current facility-administered medications on file prior to visit.    Allergies  Allergen Reactions  . Amoxicillin Anaphylaxis    Has patient had a PCN reaction causing immediate rash, facial/tongue/throat swelling, SOB or lightheadedness with hypotension: Yes Has patient had  a PCN reaction causing severe rash involving mucus membranes or skin necrosis: No Has patient had a PCN reaction that required hospitalization: Yes Has patient had a PCN reaction occurring within the last 10 years: Yes If all of the above answers are "NO", then may proceed with Cephalosporin use.   Marland Kitchen Lisinopril Shortness Of Breath  . Molds & Smuts Anaphylaxis  . Robitussin [Guaifenesin] Shortness Of Breath    wheezing    Social History   Socioeconomic History  . Marital status: Single    Spouse name: Not on file  . Number of children: 2  . Years of education: 12 grade  . Highest education level: Not on file  Occupational History  . 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 Smoking status: Current Every Day Smoker    Packs/day: 0.50    Years: 30.00    Pack years: 15.00    Types: Cigarettes  . Smokeless tobacco: Never Used  Vaping Use  . Vaping Use: Never used  Substance and Sexual Activity  . Alcohol use: Yes    Alcohol/week: 3.0 standard drinks    Types: 3 Cans of beer per week    Comment: rarely  . Drug use: No  . Sexual activity: Not Currently  Other Topics Concern  . Not on file  Social History Narrative  . Not on file   Social Determinants of Health    Financial Resource Strain:   . Difficulty of Paying Living Expenses:   Food Insecurity:   . Worried About Charity fundraiser in the Last Year:   . Arboriculturist in the Last Year:   Transportation Needs:   . Film/video editor (Medical):   Marland Kitchen Lack of Transportation (Non-Medical):   Physical Activity:   . Days of Exercise per Week:   . Minutes of Exercise per Session:   Stress:   . Feeling of Stress :   Social Connections:   . Frequency of Communication with Friends and Family:   . Frequency of Social Gatherings with Friends and Family:   . Attends Religious Services:   . Active Member of Clubs or Organizations:   . Attends Archivist Meetings:   Marland Kitchen Marital Status:   Intimate Partner Violence:   . Fear of Current or Ex-Partner:   . Emotionally Abused:   Marland Kitchen Physically Abused:   . Sexually Abused:     Family History  Problem Relation Age of Onset  . Colon cancer Father   . Lung cancer Father        was a smoker  . Prostate cancer Father   . Lung cancer Mother        was a smoker  . Asthma Mother   . Allergies Brother   . Diabetes Maternal Grandmother   . Colon polyps Neg Hx   . Esophageal cancer Neg Hx   . Stomach cancer Neg Hx   . Rectal cancer Neg Hx     Past Surgical History:  Procedure Laterality Date  . BACK SURGERY  2017  . CATARACT EXTRACTION Bilateral   . COLONOSCOPY     2013  . COLONOSCOPY  10/03/2019  . KNEE ARTHROSCOPY  2005   right  . PROSTATE SURGERY    . ROBOT ASSISTED LAPAROSCOPIC RADICAL PROSTATECTOMY  03/2010    ROS: Review of Systems Negative except as stated above  PHYSICAL EXAM: BP (!) 160/90   Pulse 83   Temp 98.2 F (  36.8 C)   Resp 16   Wt 247 lb (112 kg)   SpO2 95%   BMI 36.48 kg/m   Wt Readings from Last 3 Encounters:  11/15/19 247 lb (112 kg)  10/03/19 248 lb (112.5 kg)  09/19/19 248 lb (112.5 kg)    Physical Exam  General appearance - alert, well appearing, and in no distress Mental status - normal  mood, behavior, speech, dress, motor activity, and thought processes Nose -nasal passage is small on the left.   Mouth - mucous membranes moist, pharynx normal without lesions Neck - supple, no significant adenopathy Chest -mild diffuse expiratory wheezes Heart - normal rate, regular rhythm, normal S1, S2, no murmurs, rubs, clicks or gallops Extremities - peripheral pulses normal, no pedal edema, no clubbing or cyanosis Diabetic Foot Exam - Simple   Simple Foot Form Visual Inspection See comments: Yes Sensation Testing Intact to touch and monofilament testing bilaterally: Yes Pulse Check Posterior Tibialis and Dorsalis pulse intact bilaterally: Yes Comments Patient is flat-footed bilaterally     CMP Latest Ref Rng & Units 06/20/2018 10/14/2017 07/24/2014  Glucose 70 - 99 mg/dL 109(H) 113(H) 97  BUN 8 - 23 mg/dL '15 10 21  ' Creatinine 0.61 - 1.24 mg/dL 1.22 0.98 1.12  Sodium 135 - 145 mmol/L 134(L) 141 135  Potassium 3.5 - 5.1 mmol/L 3.4(L) 3.9 3.3(L)  Chloride 98 - 111 mmol/L 100 106 100  CO2 22 - 32 mmol/L '28 28 27  ' Calcium 8.9 - 10.3 mg/dL 9.2 8.9 8.9   Lipid Panel  No results found for: CHOL, TRIG, HDL, CHOLHDL, VLDL, LDLCALC, LDLDIRECT  CBC    Component Value Date/Time   WBC 6.7 06/20/2018 0309   RBC 5.24 06/20/2018 0309   HGB 16.4 06/20/2018 0309   HCT 48.2 06/20/2018 0309   PLT 332 06/20/2018 0309   MCV 92.0 06/20/2018 0309   MCH 31.3 06/20/2018 0309   MCHC 34.0 06/20/2018 0309   RDW 12.2 06/20/2018 0309   LYMPHSABS 2.9 06/20/2018 0309   MONOABS 0.5 06/20/2018 0309   EOSABS 0.1 06/20/2018 0309   BASOSABS 0.0 06/20/2018 0309   Results for orders placed or performed in visit on 11/15/19  POCT glucose (manual entry)  Result Value Ref Range   POC Glucose 107 (A) 70 - 99 mg/dl  POCT glycosylated hemoglobin (Hb A1C)  Result Value Ref Range   Hemoglobin A1C     HbA1c POC (<> result, manual entry)     HbA1c, POC (prediabetic range)     HbA1c, POC (controlled diabetic  range) 6.5 0.0 - 7.0 %    ASSESSMENT AND PLAN:  1. Type 2 diabetes mellitus with obesity (HCC) -At goal.  Continue Metformin.  Healthy eating habits discussed and encouraged.  Exercise as tolerated. - POCT glucose (manual entry) - POCT glycosylated hemoglobin (Hb A1C) - Microalbumin / creatinine urine ratio - CBC - Comprehensive metabolic panel - Lipid panel  2. Class 2 severe obesity due to excess calories with serious comorbidity and body mass index (BMI) of 36.0 to 36.9 in adult [facility]) See #1 above  3. Essential hypertension Not at goal.  He reports having taken his medicines Bystolic and Dyazide already for the morning.  Advised patient to check blood pressure at least twice a week and record the readings.  Follow-up with clinical pharmacist in 2 weeks for blood pressure recheck.  If blood pressure still elevated we can add amlodipine.  4. Hyperlipidemia associated with type 2 diabetes mellitus (HCC) Continue Crestor  5.  Malignant neoplasm of prostate [facility]) Advised patient to check with his insurance to see if [facility] is in network.  Once he finds an in network provider, he can call me so that we can submit the referral - PSA  6. Panic disorder It sounds like patient has anxiety or panic disorder associated with his asthma.  He is willing to try low-dose of BuSpar - busPIRone (BUSPAR) 5 MG tablet; Take 1 tablet (5 mg total) by mouth 2 (two) times daily.  Dispense: 60 tablet; Refill: 2  7. Tobacco dependence Strongly advised him to quit.  Patient states he is working on doing so.  He plans to purchase the nicotine patches.  Less than 5 minutes spent on counseling  8. Need for hepatitis C screening test - Hepatitis C Antibody  9. Screening for HIV (human immunodeficiency virus) - HIV Antibody (routine testing w rflx)  10. Severe persistent asthma with acute exacerbation Continue Breo and his nebulizer treatments.  He is also on Singulair.  Will refer to  pulmonology - Ambulatory referral to Pulmonology    Patient was given the opportunity to ask questions.  Patient verbalized understanding of the plan and was able to repeat key elements of the plan.   Orders Placed This Encounter  Procedures  . Microalbumin / creatinine urine ratio  . CBC  . Comprehensive metabolic panel  . Lipid panel  . PSA  . HIV Antibody (routine testing w rflx)  . Hepatitis C Antibody  . Ambulatory referral to Pulmonology  . POCT glucose (manual entry)  . POCT glycosylated hemoglobin (Hb A1C)     Requested Prescriptions   Signed Prescriptions Disp Refills  . busPIRone (BUSPAR) 5 MG tablet 60 tablet 2    Sig: Take 1 tablet (5 mg total) by mouth 2 (two) times daily.    Return in about 4 months (around 03/17/2020) for [facility] in 2 weeks for repeat BP check.  Karle Plumber, MD, FACP

## 2019-11-16 ENCOUNTER — Other Ambulatory Visit: Payer: Self-pay | Admitting: Internal Medicine

## 2019-11-16 DIAGNOSIS — E785 Hyperlipidemia, unspecified: Secondary | ICD-10-CM

## 2019-11-16 DIAGNOSIS — E1169 Type 2 diabetes mellitus with other specified complication: Secondary | ICD-10-CM

## 2019-11-16 LAB — COMPREHENSIVE METABOLIC PANEL
ALT: 21 IU/L (ref 0–44)
AST: 21 IU/L (ref 0–40)
Albumin/Globulin Ratio: 1.9 (ref 1.2–2.2)
Albumin: 5 g/dL — ABNORMAL HIGH (ref 3.8–4.8)
Alkaline Phosphatase: 67 IU/L (ref 48–121)
BUN/Creatinine Ratio: 12 (ref 10–24)
BUN: 15 mg/dL (ref 8–27)
Bilirubin Total: 0.5 mg/dL (ref 0.0–1.2)
CO2: 25 mmol/L (ref 20–29)
Calcium: 10.1 mg/dL (ref 8.6–10.2)
Chloride: 97 mmol/L (ref 96–106)
Creatinine, Ser: 1.22 mg/dL (ref 0.76–1.27)
GFR calc Af Amer: 72 mL/min/{1.73_m2} (ref >59)
GFR calc non Af Amer: 63 mL/min/{1.73_m2} (ref >59)
Globulin, Total: 2.7 g/dL (ref 1.5–4.5)
Glucose: 93 mg/dL (ref 65–99)
Potassium: 4.5 mmol/L (ref 3.5–5.2)
Sodium: 138 mmol/L (ref 134–144)
Total Protein: 7.7 g/dL (ref 6.0–8.5)

## 2019-11-16 LAB — LIPID PANEL
Chol/HDL Ratio: 5.2 ratio — ABNORMAL HIGH (ref 0.0–5.0)
Cholesterol, Total: 278 mg/dL — ABNORMAL HIGH (ref 100–199)
HDL: 53 mg/dL (ref >39)
LDL Chol Calc (NIH): 156 mg/dL — ABNORMAL HIGH (ref 0–99)
Triglycerides: 370 mg/dL — ABNORMAL HIGH (ref 0–149)
VLDL Cholesterol Cal: 69 mg/dL — ABNORMAL HIGH (ref 5–40)

## 2019-11-16 LAB — CBC
Hematocrit: 47.8 % (ref 37.5–51.0)
Hemoglobin: 16 g/dL (ref 13.0–17.7)
MCH: 31.1 pg (ref 26.6–33.0)
MCHC: 33.5 g/dL (ref 31.5–35.7)
MCV: 93 fL (ref 79–97)
Platelets: 361 10*3/uL (ref 150–450)
RBC: 5.14 x10E6/uL (ref 4.14–5.80)
RDW: 12.4 % (ref 11.6–15.4)
WBC: 10.7 10*3/uL (ref 3.4–10.8)

## 2019-11-16 LAB — HIV ANTIBODY (ROUTINE TESTING W REFLEX): HIV Screen 4th Generation wRfx: NONREACTIVE

## 2019-11-16 LAB — MICROALBUMIN / CREATININE URINE RATIO
Creatinine, Urine: 82.6 mg/dL
Microalb/Creat Ratio: 4 mg/g creat (ref 0–29)
Microalbumin, Urine: 3 ug/mL

## 2019-11-16 LAB — HEPATITIS C ANTIBODY: Hep C Virus Ab: <0.1 s/co ratio (ref 0.0–0.9)

## 2019-11-16 LAB — PSA: Prostate Specific Ag, Serum: 0.2 ng/mL (ref 0.0–4.0)

## 2019-11-16 MED ORDER — ROSUVASTATIN CALCIUM 20 MG PO TABS: 20.0000 mg | ORAL_TABLET | Freq: Every day | ORAL | 6 refills | Status: DC

## 2019-11-19 MED FILL — ROSUVASTATIN CALCIUM 20 MG: 20 | 30 days supply | Qty: 30 | Fill #0

## 2019-11-20 MED FILL — ALBUTEROL SUL 2.5 MG/3 ML S: (2.5 MG/3ML | 8 days supply | Qty: 150 | Fill #2

## 2019-11-20 MED FILL — MONTELUKAST SOD 10 MG TAB: 10 | 30 days supply | Qty: 30 | Fill #2

## 2019-11-21 ENCOUNTER — Other Ambulatory Visit: Payer: Self-pay | Admitting: Internal Medicine

## 2019-11-29 ENCOUNTER — Other Ambulatory Visit: Payer: Self-pay

## 2019-11-29 ENCOUNTER — Encounter: Payer: Self-pay | Admitting: Pharmacist

## 2019-11-29 ENCOUNTER — Ambulatory Visit: Payer: 59 | Attending: Internal Medicine | Admitting: Pharmacist

## 2019-11-29 ENCOUNTER — Telehealth: Payer: Self-pay | Admitting: Pharmacist

## 2019-11-29 VITALS — BP 142/92 | HR 78

## 2019-11-29 DIAGNOSIS — I10 Essential (primary) hypertension: Secondary | ICD-10-CM | POA: Diagnosis not present

## 2019-11-29 MED ORDER — AMLODIPINE BESYLATE 5 MG PO TABS
5.0000 mg | ORAL_TABLET | Freq: Every day | ORAL | 1 refills | Status: DC
Start: 2019-11-29 — End: 2019-12-23

## 2019-11-29 MED FILL — AMLODIPINE BESYLATE 5 MG TA: 5 | 30 days supply | Qty: 30 | Fill #0

## 2019-11-29 NOTE — Telephone Encounter (Signed)
Patient was seen today for BP check. Starting amlodipine as requested d/t BP today being above goal at 140/92 mmHg.   Of note, he reported good results so far with Buspar that was started at his visit with you on 7/2. With that being said, he is inquiring about a once daily/control anti-anxiety medication. I informed him that I would check with you regarding starting a possible SSRI, SNRI, etc.

## 2019-11-29 NOTE — Progress Notes (Signed)
   S:    PCP: Dr. Wynetta Emery  Patient arrives in good spirits.    Presents to the clinic for hypertension evaluation, counseling, and management.  Patient was referred and last seen by Primary Care Provider on 11/15/2019.    Medication adherence reported.  Current BP Medications include:  Bystolic 20 mg daily, Dyazide 37.5-25 mg daily   Dietary habits include: endorses compliance with salt restriction; does not drink excessive caffeine  Exercise habits include: none  Family / Social history:  - FHx: asthma, cancer, allergies, DM - Tobacco: current every day smoker - Alcohol: rarely    O:  Vitals:   11/29/19 1630  BP: (!) 142/92  Pulse: 78   Home BP readings:  - Reports wide range: SBPs 110s-140s -DBPs: 80-90s  Last 3 Office BP readings: BP Readings from Last 3 Encounters:  11/15/19 (!) 160/90  10/03/19 (!) 122/93  12/10/18 114/77    BMET    Component Value Date/Time   NA 138 11/15/2019 0935   K 4.5 11/15/2019 0935   CL 97 11/15/2019 0935   CO2 25 11/15/2019 0935   GLUCOSE 93 11/15/2019 0935   GLUCOSE 109 (H) 06/20/2018 0309   BUN 15 11/15/2019 0935   CREATININE 1.22 11/15/2019 0935   CALCIUM 10.1 11/15/2019 0935   GFRNONAA 63 11/15/2019 0935   GFRAA 72 11/15/2019 0935   Renal function: CrCl cannot be calculated (Unknown ideal weight.).  Clinical ASCVD: No  The 10-year ASCVD risk score Mikey Bussing DC Jr., et al., 2013) is: 59%   Values used to calculate the score:     Age: 64 years     Sex: Male     Is Non-Hispanic African American: Yes     Diabetic: Yes     Tobacco smoker: Yes     Systolic Blood Pressure: 660 mmHg     Is BP treated: Yes     HDL Cholesterol: 53 mg/dL     Total Cholesterol: 278 mg/dL  A/P: Hypertension longstanding currently above goal on current medications. BP Goal = < 130/80 mmHg. Medication adherence reported.  -Started amlodipine 5 mg daily.  -Counseled on lifestyle modifications for blood pressure control including reduced dietary  sodium, increased exercise, adequate sleep.  Results reviewed and written information provided.   Total time in face-to-face counseling 15 minutes.   F/U Clinic Visit in 1 month.   Benard Halsted, PharmD, [place] [phone]

## 2019-12-02 NOTE — Telephone Encounter (Signed)
Please schedule pt a virtual visit

## 2019-12-06 ENCOUNTER — Other Ambulatory Visit: Payer: Self-pay | Admitting: Internal Medicine

## 2019-12-06 MED FILL — BREO ELLIPTA 200-25 MCG INH: 200-25 | 30 days supply | Qty: 60 | Fill #3

## 2019-12-06 MED FILL — VENTOLIN HFA 90 MCG INHALER: 108 (90 BAS | 16 days supply | Qty: 18 | Fill #0

## 2019-12-06 MED FILL — FLUTICASONE PROP 50 MCG SPR: 50 | 30 days supply | Qty: 16 | Fill #10

## 2019-12-09 ENCOUNTER — Other Ambulatory Visit: Payer: Self-pay | Admitting: Internal Medicine

## 2019-12-09 DIAGNOSIS — F410 Panic disorder [episodic paroxysmal anxiety] without agoraphobia: Secondary | ICD-10-CM

## 2019-12-09 MED FILL — TRIAMTERENE/HCTZ 37.5/25 CP: 37.5-25 | 30 days supply | Qty: 30 | Fill #0

## 2019-12-09 MED FILL — busPIRone HCL 5 MG TABS: 5 | 30 days supply | Qty: 60 | Fill #1

## 2019-12-09 NOTE — Telephone Encounter (Signed)
Upon chart review (medication hx and current/office notes) there is no indicator that pt is supposed to take medication tid. Called pt but had to LM on VM- advised pt to call office with name of provider who instructed him to take med tid instead of bid.

## 2019-12-09 NOTE — Telephone Encounter (Signed)
Pt states this medication was increased to 3 a day, and now he is out and needs a new refill. The one he has is for only 2/day.  busPIRone (BUSPAR) 5 MG tablet   [place] [employer] Phone:  [phone]  Fax:  [phone]

## 2019-12-09 NOTE — Telephone Encounter (Signed)
   Notes to clinic:  pt states that he is taking tid PLEASE VERIFY DIRECTION    Requested Prescriptions  Pending Prescriptions Disp Refills   busPIRone (BUSPAR) 5 MG tablet [Pharmacy Med Name: busPIRone HCL 5 MG TABS 5 Tablet] 60 tablet 2    Sig: TAKE 1 TABLET (5 MG TOTAL) BY MOUTH 2 (TWO) TIMES DAILY.      Psychiatry: Anxiolytics/Hypnotics - Non-controlled Passed - 12/09/2019 11:13 AM      Passed - Valid encounter within last 6 months    Recent Outpatient Visits           1 week ago Essential hypertension   [place], Annie Main L, RPH-CPP   3 weeks ago Type 2 diabetes mellitus with obesity ([place])   [place] Karle Plumber B, MD   7 months ago Controlled type 2 diabetes mellitus without complication, without long-term current use of insulin ([place])   [place], MD   11 months ago Severe persistent asthma without complication   [place], MD   12 months ago Non-seasonal allergic rhinitis due to pollen   [place], MD       Future Appointments             In 2 weeks Ladell Pier, MD [place]   In 3 weeks Daisy Blossom, Jarome Matin, [place]   In 3 months Wynetta Emery, Dalbert Batman, MD [place]

## 2019-12-16 ENCOUNTER — Other Ambulatory Visit: Payer: Self-pay | Admitting: Internal Medicine

## 2019-12-16 MED FILL — IPRAT-ALBUT 0.5-3(2.5) MG/3: 0.5-2.5 (3) | 30 days supply | Qty: 180 | Fill #1

## 2019-12-16 MED FILL — ALBUTEROL SUL 2.5 MG/3 ML S: (2.5 MG/3ML | 8 days supply | Qty: 150 | Fill #0

## 2019-12-16 NOTE — Telephone Encounter (Signed)
Requested medication (s) are due for refill today: no  Requested medication (s) are on the active medication list: yes  Last refill:  11/20/2019  Future visit scheduled: yes  Notes to clinic:  One inhaler should last at least one month. If the patient is requesting refills earlier, contact the patient to check for uncontrolled symptoms   Requested Prescriptions  Pending Prescriptions Disp Refills   albuterol (PROVENTIL) (2.5 MG/3ML) 0.083% nebulizer solution [Pharmacy Med Name: ALBUTEROL SUL 2.5 MG/3 ML S (2.5 MG/3ML Nebulization Solution] 150 mL 3    Sig: Take 3 mLs (2.5 mg total) by nebulization every 4 (four) hours as needed for wheezing or shortness of breath (((PLAN B))).      Pulmonology:  Beta Agonists Failed - 12/16/2019  9:22 AM      Failed - One inhaler should last at least one month. If the patient is requesting refills earlier, contact the patient to check for uncontrolled symptoms.      Passed - Valid encounter within last 12 months    Recent Outpatient Visits           2 weeks ago Essential hypertension   [place], Annie Main L, RPH-CPP   1 month ago Type 2 diabetes mellitus with obesity [facility])   [place] Karle Plumber B, MD   8 months ago Controlled type 2 diabetes mellitus without complication, without long-term current use of insulin [facility])   [place], MD   11 months ago Severe persistent asthma without complication   [place], MD   1 year ago Non-seasonal allergic rhinitis due to pollen   [place], MD       Future Appointments             In 1 week Ladell Pier, MD [place]   In 2 weeks Daisy Blossom, Jarome Matin, [place]   In 3 months Wynetta Emery, Dalbert Batman, MD  [place]

## 2019-12-19 ENCOUNTER — Other Ambulatory Visit: Payer: Self-pay | Admitting: Internal Medicine

## 2019-12-19 MED FILL — PANTOPRAZOLE SOD DR 40 MG T: 40 | 30 days supply | Qty: 30 | Fill #0

## 2019-12-23 ENCOUNTER — Other Ambulatory Visit: Payer: Self-pay | Admitting: Internal Medicine

## 2019-12-23 ENCOUNTER — Ambulatory Visit: Payer: 59 | Attending: Internal Medicine | Admitting: Internal Medicine

## 2019-12-23 DIAGNOSIS — J4540 Moderate persistent asthma, uncomplicated: Secondary | ICD-10-CM

## 2019-12-23 DIAGNOSIS — I10 Essential (primary) hypertension: Secondary | ICD-10-CM

## 2019-12-23 DIAGNOSIS — F410 Panic disorder [episodic paroxysmal anxiety] without agoraphobia: Secondary | ICD-10-CM

## 2019-12-23 MED ORDER — AMLODIPINE BESYLATE 5 MG PO TABS: 5.0000 mg | ORAL_TABLET | Freq: Two times a day (BID) | ORAL | 0 refills | Status: DC

## 2019-12-23 MED ORDER — BUSPIRONE HCL 5 MG PO TABS: 5.0000 mg | ORAL_TABLET | Freq: Three times a day (TID) | ORAL | 5 refills | Status: DC

## 2019-12-23 MED ORDER — TRIAMTERENE-HCTZ 37.5-25 MG PO CAPS: 1.0000 | ORAL_CAPSULE | Freq: Every day | ORAL | 6 refills | Status: DC

## 2019-12-23 MED FILL — ALBUTEROL SUL 2.5 MG/3 ML S: (2.5 MG/3ML | 8 days supply | Qty: 150 | Fill #0

## 2019-12-23 MED FILL — AMLODIPINE BESYLATE 5 MG TA: 5 | 30 days supply | Qty: 60 | Fill #0

## 2019-12-23 MED FILL — IPRAT-ALBUT 0.5-3(2.5) MG/3: 0.5-2.5 (3) | 30 days supply | Qty: 180 | Fill #1

## 2019-12-23 NOTE — Progress Notes (Addendum)
Virtual Visit via Telephone Note Due to current restrictions/limitations of in-office visits due to the COVID-19 pandemic, this scheduled clinical appointment was converted to a telehealth visit  I connected with Joseph Harvey on 12/23/19 at 8:46 a.m by telephone and verified that I am speaking with the correct person using two identifiers. I am in my office.  The patient is at home.  Only the patient and myself participated in this encounter.  I discussed the limitations, risks, security and privacy concerns of performing an evaluation and management service by telephone and the availability of in person appointments. I also discussed with the patient that there may be a patient responsible charge related to this service. The patient expressed understanding and agreed to proceed.   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  Patient wanting to discuss medication for anxiety/panic disorder.  On last visit I had started him on BuSpar 5 mg twice a day.  He feels that he is much improved taking the medication but states there were times when he took it 3 times a day.  States he was told by the clinical pharmacist that it should be 3 times a day dosing or we can discuss changing to a different anxiety medication that he takes once a day.  He is requesting refills on the DuoNeb and albuterol nebulizer treatments.  He states that he usually uses the albuterol nebulizer treatments and saves the DuoNeb for days when his symptoms are really bad.  I informed him that he does have refills on both of these.  He saw the clinical pharmacist recently for recheck on blood pressure which was not at goal.  Amlodipine 5 mg was added.  He states there were days when he took it twice a day because he felt his blood pressure was still high and so he  ran out of the medication early.   Current Outpatient Medications on File Prior to Visit  Medication Sig Dispense Refill  . albuterol (PROVENTIL) (2.5 MG/3ML) 0.083% nebulizer solution TAKE 3 MLS (2.5 MG TOTAL) BY NEBULIZATION EVERY 4 (FOUR) HOURS AS NEEDED FOR WHEEZING OR SHORTNESS OF BREATH (((PLAN B))). 150 mL 3  . amLODipine (NORVASC) 5 MG tablet Take 1 tablet (5 mg total) by mouth daily. 30 tablet 1  . ASPIRIN 81 PO Take 81 mg by mouth daily.    . Azelastine-Fluticasone 137-50 MCG/ACT SUSP Place into the nose.    . baclofen (LIORESAL) 10 MG tablet Take 10 mg by mouth daily. (Patient not taking: Reported on 11/15/2019)    . Blood Glucose Monitoring Suppl (TRUE METRIX METER) w/Device KIT Use as directed 1 kit 0  . busPIRone (BUSPAR) 5 MG tablet TAKE 1 TABLET (5 MG TOTAL) BY MOUTH 2 (TWO) TIMES DAILY. 60 tablet 2  . BYSTOLIC 20 MG TABS TAKE 1 TABLET (20 MG TOTAL) BY MOUTH 2 (TWO) TIMES DAILY. 180 tablet 0  . Cholecalciferol (VITAMIN D PO) Take 1 tablet by mouth daily.    Marland Kitchen docusate sodium (COLACE) 100 MG capsule Take 1 capsule (100 mg total) by mouth 2 (two) times daily as needed for mild constipation. 10 capsule 0  . fluticasone furoate-vilanterol (BREO ELLIPTA) 200-25 MCG/INH AEPB Inhale 1 puff into the lungs daily. 28 each 5  . gabapentin (NEURONTIN) 300 MG capsule Take 1 capsule (300 mg total) by mouth daily as needed. 90 capsule 1  . glucose blood (  TRUE METRIX BLOOD GLUCOSE TEST) test strip Use as instructed 100 each 12  . ipratropium-albuterol (DUONEB) 0.5-2.5 (3) MG/3ML SOLN USE VIAL BY NEBULIZATION 2 (TWO) TIMES DAILY. 180 mL 1  . loratadine (CLARITIN) 10 MG tablet Take 1 tablet (10 mg total) by mouth daily. 30 tablet 3  . metFORMIN (GLUCOPHAGE-XR) 500 MG 24 hr tablet TAKE 1 TABLET BY MOUTH DAILY. 30 tablet 2  . montelukast (SINGULAIR) 10 MG tablet TAKE 1 TABLET (10 MG TOTAL) BY MOUTH AT BEDTIME. 30 tablet 3  . pantoprazole (PROTONIX) 40 MG tablet TAKE 1 TABLET (40 MG TOTAL) BY MOUTH  DAILY. 30 tablet 0  . polyethylene glycol powder (GLYCOLAX/MIRALAX) 17 GM/SCOOP powder Take 17 g by mouth daily as needed. 3350 g 1  . potassium chloride SA (KLOR-CON M20) 20 MEQ tablet Take 20 mEq by mouth daily.    . predniSONE (DELTASONE) 10 MG tablet Take 5 tabs PO as needed for asthma flare (Patient not taking: Reported on 12/23/2019) 30 tablet 1  . Respiratory Therapy Supplies (FLUTTER) DEVI Use three times a day after inhaler or nebulizer use 1 each 0  . rosuvastatin (CRESTOR) 20 MG tablet Take 1 tablet (20 mg total) by mouth daily. 30 tablet 6  . triamterene-hydrochlorothiazide (DYAZIDE) 37.5-25 MG capsule TAKE 1 CAPSULE BY MOUTH DAILY. 30 capsule 0  . TRUEplus Lancets 28G MISC Use as directed 100 each 4  . VENTOLIN HFA 108 (90 Base) MCG/ACT inhaler INHALE 2 PUFFS INTO THE LUNGS EVERY 4 (FOUR) HOURS AS NEEDED FOR WHEEZING OR SHORTNESS OF BREATH. 18 g 0   No current facility-administered medications on file prior to visit.      Observations/Objective: No direct observation done as this was a telephone encounter.  Assessment and Plan: 1. Panic disorder Patient improved on BuSpar.  We discussed changing him to an SSRI versus increasing the BuSpar to 3 times a day dosing.  Patient preferred the latter. - busPIRone (BUSPAR) 5 MG tablet; Take 1 tablet (5 mg total) by mouth 3 (three) times daily.  Dispense: 90 tablet; Refill: 5  2. Essential hypertension I change the amlodipine prescription to reflect twice a day dosing until he sees the clinical pharmacist next week.  If at that time his blood pressure is doing better on the amlodipine we can just change it to 10 mg once a day. - triamterene-hydrochlorothiazide (DYAZIDE) 37.5-25 MG capsule; Take 1 each (1 capsule total) by mouth daily.  Dispense: 30 capsule; Refill: 6 - amLODipine (NORVASC) 5 MG tablet; Take 1 tablet (5 mg total) by mouth 2 (two) times daily.  Dispense: 60 tablet; Refill: 0  3. Moderate persistent asthma, unspecified  whether complicated Advised patient that he has refills on his nebulizer treatments.   Follow Up Instructions: As previously scheduled.   I discussed the assessment and treatment plan with the patient. The patient was provided an opportunity to ask questions and all were answered. The patient agreed with the plan and demonstrated an understanding of the instructions.   The patient was advised to call back or seek an in-person evaluation if the symptoms worsen or if the condition fails to improve as anticipated.  I provided 13 minutes of non-face-to-face time during this encounter.   Karle Plumber, MD

## 2019-12-24 MED FILL — BYSTOLIC 20 MG TABLET: 20 | 90 days supply | Qty: 180 | Fill #0

## 2019-12-25 ENCOUNTER — Other Ambulatory Visit: Payer: Self-pay | Admitting: Internal Medicine

## 2019-12-25 NOTE — Telephone Encounter (Signed)
Pt reports nasal drainage down throat causing raspy voice. Productive sputum cough. No fever. Pt states chronic condition comes and goes. Pt states previous PCP prescribed Doxycycline & prednisone but he had to change PCP due to his insurance. Pt states he has discussed chronic sinus issues with provider. Pt uses [facility]. Pt ph [phone].

## 2019-12-25 NOTE — Telephone Encounter (Signed)
Requested medication (s) are due for refill today:  No  Requested medication (s) are on the active medication list:  Doxycycline- no                                                                                                  Prednisone- yes  Future visit scheduled:  Yes  Last Refill: Doxycycline- not currently taking                   Prednisone 08/26/19; #30; RF x 1  Notes to clinic: Attempted to call pt. To discuss current symptoms, that he is requesting antibiotic and steroid for tx. of.; left vm to return call to the office to discuss his symptoms.    Requested Prescriptions  Pending Prescriptions Disp Refills   doxycycline (VIBRA-TABS) 100 MG tablet [Pharmacy Med Name: DOXYCYCLINE HYCLATE 100 MG 100 Tablet] 20 tablet 2    Sig: TAKE 1 TABLET BY MOUTH TWICE DAILY FOR 10 DAYS      Off-Protocol Failed - 12/25/2019  1:04 PM      Failed - Medication not assigned to a protocol, review manually.      Passed - Valid encounter within last 12 months    Recent Outpatient Visits           2 days ago Panic disorder   [place], Deborah B, MD   3 weeks ago Essential hypertension   [place], Annie Main L, RPH-CPP   1 month ago Type 2 diabetes mellitus with obesity [facility])   [place] Karle Plumber B, MD   8 months ago Controlled type 2 diabetes mellitus without complication, without long-term current use of insulin [facility])   [place] Karle Plumber B, MD   11 months ago Severe persistent asthma without complication   [place], Deborah B, MD       Future Appointments             In 5 days Daisy Blossom, Jarome Matin, [place]   In 2 months Wynetta Emery, Dalbert Batman, MD [place]              predniSONE (DELTASONE) 10 MG tablet  [Pharmacy Med Name: predniSONE 10 MG TABS 10 Tablet] 48 tablet 0    Sig: TAKE 6 TABLETS BY MOUTH DAILY FOR 4 DAYS,THEN 4 TABS DAILY FOR 4 DAYS,THEN 2 TABS DAILY FOR 4 DAYS,THEN STOP      Not Delegated - Endocrinology:  Oral Corticosteroids Failed - 12/25/2019  1:04 PM      Failed - This refill cannot be delegated      Failed - Last BP in normal range    BP Readings from Last 1 Encounters:  11/29/19 (!) 142/92          Passed - Valid encounter within last 6 months    Recent Outpatient Visits  2 days ago Panic disorder   [place], MD   3 weeks ago Essential hypertension   [place], Jarome Matin, RPH-CPP   1 month ago Type 2 diabetes mellitus with obesity [facility])   [place] Karle Plumber B, MD   8 months ago Controlled type 2 diabetes mellitus without complication, without long-term current use of insulin [facility])   [place], MD   11 months ago Severe persistent asthma without complication   [place], Deborah B, MD       Future Appointments             In 5 days Daisy Blossom, Jarome Matin, [place]   In 2 months Wynetta Emery, Dalbert Batman, MD [place]

## 2019-12-26 MED FILL — predniSONE 10 MG TABS: 10 | 12 days supply | Qty: 48 | Fill #0

## 2019-12-26 MED FILL — DOXYCYCLINE HYCLATE 100 MG: 100 | 10 days supply | Qty: 20 | Fill #0

## 2019-12-30 ENCOUNTER — Ambulatory Visit: Payer: 59 | Attending: Internal Medicine | Admitting: Pharmacist

## 2019-12-30 ENCOUNTER — Encounter: Payer: Self-pay | Admitting: Pharmacist

## 2019-12-30 ENCOUNTER — Other Ambulatory Visit: Payer: Self-pay

## 2019-12-30 VITALS — BP 115/79 | HR 91

## 2019-12-30 DIAGNOSIS — I10 Essential (primary) hypertension: Secondary | ICD-10-CM

## 2019-12-30 MED ORDER — AMLODIPINE BESYLATE 10 MG PO TABS: 10.0000 mg | ORAL_TABLET | Freq: Every day | ORAL | 2 refills | Status: DC

## 2019-12-30 MED FILL — GABAPENTIN 300 MG CAPSULE: 300 | 30 days supply | Qty: 90 | Fill #3

## 2019-12-30 MED FILL — BACLOFEN 10 MG TABLET: 10 | 30 days supply | Qty: 90 | Fill #0

## 2019-12-30 MED FILL — AMLODIPINE BESYLATE 10 MG T: 10 | 30 days supply | Qty: 30 | Fill #0

## 2019-12-30 NOTE — Progress Notes (Signed)
   S:    PCP: Dr. Wynetta Emery  Patient arrives in good spirits. Presents to the clinic for hypertension evaluation, counseling, and management. Patient was referred and last seen by Primary Care Provider on 11/15/2019. He saw Dr. Wynetta Emery 12/23/2019 and reported using amlodipin 5 mg BID.   Medication adherence reported.  Current BP Medications include:  Amlodipine 5 mg BID, Bystolic 20 mg daily, Dyazide 37.5-25 mg daily   Dietary habits include: endorses compliance with salt restriction; does not drink excessive caffeine  Exercise habits include: none  Family / Social history:  - FHx: asthma, cancer, allergies, DM - Tobacco: current every day smoker - Alcohol: rarely    O:  Vitals:   12/30/19 0926  BP: 115/79  Pulse: 91   Home BP readings:  - Reports SBPs 110s-130s -DBPs: 70s-80s  Last 3 Office BP readings: BP Readings from Last 3 Encounters:  12/30/19 115/79  11/29/19 (!) 142/92  11/15/19 (!) 160/90    BMET    Component Value Date/Time   NA 138 11/15/2019 0935   K 4.5 11/15/2019 0935   CL 97 11/15/2019 0935   CO2 25 11/15/2019 0935   GLUCOSE 93 11/15/2019 0935   GLUCOSE 109 (H) 06/20/2018 0309   BUN 15 11/15/2019 0935   CREATININE 1.22 11/15/2019 0935   CALCIUM 10.1 11/15/2019 0935   GFRNONAA 63 11/15/2019 0935   GFRAA 72 11/15/2019 0935   Renal function: CrCl cannot be calculated (Patient's most recent lab result is older than the maximum 21 days allowed.).  Clinical ASCVD: No  The 10-year ASCVD risk score Mikey Bussing DC Jr., et al., 2013) is: 37.7%   Values used to calculate the score:     Age: 64 years     Sex: Male     Is Non-Hispanic African American: Yes     Diabetic: Yes     Tobacco smoker: Yes     Systolic Blood Pressure: 161 mmHg     Is BP treated: Yes     HDL Cholesterol: 53 mg/dL     Total Cholesterol: 278 mg/dL  A/P: Hypertension longstanding currently at goal on current medications. BP Goal = < 130/80 mmHg. Medication adherence reported. Will change  BID amlodipine to amlodipine 10 mg daily. Pt counseled and verbalizes understanding.  -Start amlodipine 10 mg daily.  -Continue other medications.  -Counseled on lifestyle modifications for blood pressure control including reduced dietary sodium, increased exercise, adequate sleep.  Results reviewed and written information provided.   Total time in face-to-face counseling 15 minutes.   F/U Clinic Visit with PCP 03/17/2020.  Benard Halsted, PharmD, [place] [phone]

## 2019-12-31 ENCOUNTER — Other Ambulatory Visit: Payer: Self-pay | Admitting: Internal Medicine

## 2019-12-31 MED FILL — metFORMIN HCL ER 500 MG TB2: 500 | 90 days supply | Qty: 90 | Fill #0

## 2019-12-31 NOTE — Telephone Encounter (Signed)
Requested Prescriptions  Pending Prescriptions Disp Refills  . metFORMIN (GLUCOPHAGE-XR) 500 MG 24 hr tablet [Pharmacy Med Name: metFORMIN HCL ER 500 MG TB2 500 Tablet] 90 tablet 0    Sig: TAKE 1 TABLET BY MOUTH DAILY.     Endocrinology:  Diabetes - Biguanides Passed - 12/31/2019  9:56 AM      Passed - Cr in normal range and within 360 days    Creatinine, Ser  Date Value Ref Range Status  11/15/2019 1.22 0.76 - 1.27 mg/dL Final         Passed - HBA1C is between 0 and 7.9 and within 180 days    HbA1c, POC (prediabetic range)  Date Value Ref Range Status  07/19/2018 6.0 5.7 - 6.4 % Final   HbA1c, POC (controlled diabetic range)  Date Value Ref Range Status  11/15/2019 6.5 0.0 - 7.0 % Final         Passed - eGFR in normal range and within 360 days    GFR calc Af Amer  Date Value Ref Range Status  11/15/2019 72 >59 mL/min/1.73 Final    Comment:    **[facility] currently reports eGFR in compliance with the current**   recommendations of the [employer]. [facility] will   update reporting as new guidelines are published from the NKF-ASN   Task force.    GFR calc non Af Amer  Date Value Ref Range Status  11/15/2019 63 >59 mL/min/1.73 Final         Passed - Valid encounter within last 6 months    Recent Outpatient Visits          Yesterday Essential hypertension   [place], Jarome Matin, RPH-CPP   1 week ago Panic disorder   [place], MD   1 month ago Essential hypertension   [place], Jarome Matin, RPH-CPP   1 month ago Type 2 diabetes mellitus with obesity [facility])   [place] Karle Plumber B, MD   8 months ago Controlled type 2 diabetes mellitus without complication, without long-term current use of insulin [facility])   [place], MD      Future  Appointments            In 2 months Wynetta Emery Dalbert Batman, MD [place]

## 2020-01-01 MED FILL — BREO ELLIPTA 200-25 MCG INH: 200-25 | 30 days supply | Qty: 60 | Fill #4

## 2020-01-02 MED FILL — busPIRone HCL 5 MG TABS: 5 | 30 days supply | Qty: 90 | Fill #0

## 2020-01-02 MED FILL — TRIAMTERENE-HCTZ 37.5-25 MG: 37.5-25 | 30 days supply | Qty: 30 | Fill #0

## 2020-01-28 ENCOUNTER — Other Ambulatory Visit: Payer: Self-pay | Admitting: Internal Medicine

## 2020-01-28 ENCOUNTER — Other Ambulatory Visit: Payer: Self-pay | Admitting: Pharmacist

## 2020-01-28 ENCOUNTER — Other Ambulatory Visit: Payer: Self-pay | Admitting: Critical Care Medicine

## 2020-01-28 MED ORDER — AMLODIPINE BESYLATE 5 MG PO TABS
ORAL_TABLET | ORAL | 1 refills | Status: DC
Start: 2020-01-28 — End: 2020-07-20

## 2020-01-28 MED FILL — AMLODIPINE BESYLATE 5 MG TA: 5 | 90 days supply | Qty: 180 | Fill #0

## 2020-01-28 MED FILL — ROSUVASTATIN CALCIUM 20 MG: 20 | 30 days supply | Qty: 30 | Fill #2

## 2020-01-28 MED FILL — MONTELUKAST SOD 10 MG TAB: 10 | 30 days supply | Qty: 30 | Fill #3

## 2020-01-28 MED FILL — busPIRone HCL 5 MG TABS: 5 | 30 days supply | Qty: 90 | Fill #1

## 2020-01-28 MED FILL — BREO ELLIPTA 200-25 MCG INH: 200-25 | 30 days supply | Qty: 60 | Fill #5

## 2020-01-28 MED FILL — VENTOLIN HFA 90 MCG INHALER: 108 (90 BAS | 25 days supply | Qty: 18 | Fill #0

## 2020-01-28 MED FILL — TRIAMTERENE-HCTZ 37.5-25 MG: 37.5-25 | 30 days supply | Qty: 30 | Fill #1

## 2020-01-28 MED FILL — IPRAT-ALBUT 0.5-3(2.5) MG/3: 0.5-2.5 (3) | 30 days supply | Qty: 180 | Fill #0

## 2020-01-28 MED FILL — ALBUTEROL SUL 2.5 MG/3 ML S: (2.5 MG/3ML | 8 days supply | Qty: 150 | Fill #1

## 2020-01-28 MED FILL — PANTOPRAZOLE SOD DR 40 MG T: 40 | 30 days supply | Qty: 30 | Fill #0

## 2020-01-28 NOTE — Telephone Encounter (Signed)
Requested medication (s) are due for refill today: yes  Requested medication (s) are on the active medication list: no  Last refill:  12/10/18  Future visit scheduled: yes  Notes to clinic:  pt is requesting a refill. Previous prescription expired on 10/03/19   Requested Prescriptions  Pending Prescriptions Disp Refills   fluticasone (FLONASE) 50 MCG/ACT nasal spray [Pharmacy Med Name: FLUTICASONE PROP 50 MCG SPR 50 SUS] 16 g 11    Sig: Place 2 sprays into both nostrils daily.      Ear, Nose, and Throat: Nasal Preparations - Corticosteroids Passed - 01/28/2020  4:23 PM      Passed - Valid encounter within last 12 months    Recent Outpatient Visits           4 weeks ago Essential hypertension   [place], Jarome Matin, RPH-CPP   1 month ago Panic disorder   [place], MD   2 months ago Essential hypertension   [place], Stephen L, RPH-CPP   2 months ago Type 2 diabetes mellitus with obesity [facility])   [place] Karle Plumber B, MD   9 months ago Controlled type 2 diabetes mellitus without complication, without long-term current use of insulin [facility])   [place], MD       Future Appointments             In 1 month Wynetta Emery Dalbert Batman, MD [place]

## 2020-01-28 NOTE — Telephone Encounter (Signed)
Requested Prescriptions  Pending Prescriptions Disp Refills   ipratropium-albuterol (DUONEB) 0.5-2.5 (3) MG/3ML SOLN [Pharmacy Med Name: IPRAT-ALBUT 0.5-3(2.5) MG/3 0.5-2.5 (3) Solution] 180 mL 1    Sig: USE VIAL BY NEBULIZATION 2 (TWO) TIMES DAILY.     Pulmonology:  Combination Products Passed - 01/28/2020  4:24 PM      Passed - Valid encounter within last 12 months    Recent Outpatient Visits          4 weeks ago Essential hypertension   [place], Jarome Matin, RPH-CPP   1 month ago Panic disorder   [place], Joseph Harvey   2 months ago Essential hypertension   [place], Jarome Matin, RPH-CPP   2 months ago Type 2 diabetes mellitus with obesity [facility])   [place] Karle Plumber B, Joseph Harvey   9 months ago Controlled type 2 diabetes mellitus without complication, without long-term current use of insulin (Joseph Harvey)   [place], Joseph Harvey      Future Appointments            In 1 month Joseph Emery Dalbert Batman, Joseph Harvey [place]            VENTOLIN HFA 108 [phone] Base) MCG/ACT inhaler [Pharmacy Med Name: VENTOLIN HFA 90 MCG INHALER 108 (90 BAS Aerosol] 18 g 0    Sig: INHALE 2 PUFFS INTO THE LUNGS EVERY 4 (FOUR) HOURS AS NEEDED FOR WHEEZING OR SHORTNESS OF BREATH.     Pulmonology:  Beta Agonists Failed - 01/28/2020  4:24 PM      Failed - One inhaler should last at least one month. If the patient is requesting refills earlier, contact the patient to check for uncontrolled symptoms.      Passed - Valid encounter within last 12 months    Recent Outpatient Visits          4 weeks ago Essential hypertension   [place], Jarome Matin, RPH-CPP   1 month ago Panic disorder   [place], Joseph Harvey    2 months ago Essential hypertension   [place], Jarome Matin, RPH-CPP   2 months ago Type 2 diabetes mellitus with obesity [facility])   [place] Karle Plumber B, Joseph Harvey   9 months ago Controlled type 2 diabetes mellitus without complication, without long-term current use of insulin [facility])   [place], Joseph Harvey      Future Appointments            In 1 month [place], Joseph Harvey [place]            pantoprazole (PROTONIX) 40 MG tablet [Pharmacy Med Name: PANTOPRAZOLE SOD DR 40 MG T 40 Tablet] 30 tablet 0    Sig: TAKE 1 TABLET (40 MG TOTAL) BY MOUTH DAILY.     Gastroenterology: Proton Pump Inhibitors Passed - 01/28/2020  4:24 PM      Passed - Valid encounter within last 12 months    Recent Outpatient Visits          4 weeks ago Essential hypertension   [place], RPH-CPP  1 month ago Panic disorder   [place], Joseph Harvey   2 months ago Essential hypertension   [place], Jarome Matin, RPH-CPP   2 months ago Type 2 diabetes mellitus with obesity [facility])   [place] Karle Plumber B, Joseph Harvey   9 months ago Controlled type 2 diabetes mellitus without complication, without long-term current use of insulin [facility])   [place], Joseph Harvey      Future Appointments            In 1 month Joseph Emery, Dalbert Batman, Joseph Harvey [place]

## 2020-01-29 MED FILL — FLUTICASONE PROP 50 MCG SPR: 50 | 30 days supply | Qty: 16 | Fill #0

## 2020-02-13 MED FILL — predniSONE 10 MG TABS: 10 | 6 days supply | Qty: 12 | Fill #0

## 2020-02-13 MED FILL — DOXYCYCLINE HYCLATE 100 MG: 100 | 10 days supply | Qty: 20 | Fill #1

## 2020-02-26 ENCOUNTER — Other Ambulatory Visit: Payer: Self-pay

## 2020-02-26 ENCOUNTER — Other Ambulatory Visit: Payer: Self-pay | Admitting: Internal Medicine

## 2020-02-26 ENCOUNTER — Other Ambulatory Visit: Payer: Self-pay | Admitting: Pulmonary Disease

## 2020-02-26 ENCOUNTER — Ambulatory Visit (INDEPENDENT_AMBULATORY_CARE_PROVIDER_SITE_OTHER): Payer: 59 | Admitting: Pulmonary Disease

## 2020-02-26 ENCOUNTER — Encounter: Payer: Self-pay | Admitting: Pulmonary Disease

## 2020-02-26 VITALS — BP 140/76 | HR 101 | Temp 98.1°F | Ht 69.0 in | Wt 243.0 lb

## 2020-02-26 DIAGNOSIS — R0609 Other forms of dyspnea: Secondary | ICD-10-CM

## 2020-02-26 DIAGNOSIS — J4550 Severe persistent asthma, uncomplicated: Secondary | ICD-10-CM | POA: Diagnosis not present

## 2020-02-26 DIAGNOSIS — R0600 Dyspnea, unspecified: Secondary | ICD-10-CM | POA: Diagnosis not present

## 2020-02-26 LAB — CBC WITH DIFFERENTIAL/PLATELET
Basophils Absolute: 0.2 10*3/uL — ABNORMAL HIGH (ref 0.0–0.1)
Basophils Relative: 2.1 % (ref 0.0–3.0)
Eosinophils Absolute: 0.1 10*3/uL (ref 0.0–0.7)
Eosinophils Relative: 1.4 % (ref 0.0–5.0)
HCT: 46.8 % (ref 39.0–52.0)
Hemoglobin: 16.4 g/dL (ref 13.0–17.0)
Lymphocytes Relative: 37.1 % (ref 12.0–46.0)
Lymphs Abs: 3.2 10*3/uL (ref 0.7–4.0)
MCHC: 35 g/dL (ref 30.0–36.0)
MCV: 93.8 fl (ref 78.0–100.0)
Monocytes Absolute: 0.7 10*3/uL (ref 0.1–1.0)
Monocytes Relative: 7.9 % (ref 3.0–12.0)
Neutro Abs: 4.5 10*3/uL (ref 1.4–7.7)
Neutrophils Relative %: 51.5 % (ref 43.0–77.0)
Platelets: 387 10*3/uL (ref 150.0–400.0)
RBC: 4.99 Mil/uL (ref 4.22–5.81)
RDW: 12.6 % (ref 11.5–15.5)
WBC: 8.7 10*3/uL (ref 4.0–10.5)

## 2020-02-26 MED ORDER — BUDESONIDE-FORMOTEROL FUMARATE 160-4.5 MCG/ACT IN AERO: 2.0000 | INHALATION_SPRAY | Freq: Two times a day (BID) | RESPIRATORY_TRACT | 11 refills | Status: DC

## 2020-02-26 MED FILL — SYMBICORT 160-4.5 MCG INH: 160-4.5 | 30 days supply | Qty: 10 | Fill #0

## 2020-02-26 MED FILL — FLUTICASONE PROP 50 MCG SPR: 50 | 30 days supply | Qty: 16 | Fill #1

## 2020-02-26 MED FILL — IPRAT-ALBUT 0.5-3(2.5) MG/3: 0.5-2.5 (3) | 30 days supply | Qty: 180 | Fill #1

## 2020-02-26 MED FILL — ALBUTEROL SUL 2.5 MG/3 ML S: (2.5 MG/3ML | 8 days supply | Qty: 150 | Fill #2

## 2020-02-26 MED FILL — busPIRone HCL 5 MG TABS: 5 | 30 days supply | Qty: 90 | Fill #2

## 2020-02-26 MED FILL — PANTOPRAZOLE SOD DR 40 MG T: 40 | 30 days supply | Qty: 30 | Fill #0

## 2020-02-26 MED FILL — TRIAMTERENE-HCTZ 37.5-25 MG: 37.5-25 | 30 days supply | Qty: 30 | Fill #2

## 2020-02-26 NOTE — Patient Instructions (Signed)
Nice to meet you!  We will get labs today.  We need more information about your shortness of breath  - schedule breathing tests and a heart ultrasound in the next couple of weeks.  I prescribed Symbicort - the computer said it would be covered by insurance. If not, call us and we will try something else. Stop the breo for now.   Come back for follow up with Dr. Silas Flood in 3 months.

## 2020-02-26 NOTE — Progress Notes (Signed)
Patient ID: Joseph Harvey, male    DOB: [birth year], 64 y.o.   MRN: [id number]  Chief Complaint  Patient presents with  . Consult    Asthma, wheezing, no cough, but shortness of breath, neb machine 3-4x/day     Referring provider: Ladell Pier, MD  HPI:   Joseph Harvey is a 64 year old man whom we are seeing in consultation at the request of Joseph Plumber, MD for evaluation of asthma.  Notes from referring provider and prior pulmonary notes reviewed.  Patient formerly seen by Joseph Harvey last in 2016.  At last visit symptoms are well controlled with Symbicort.  Unclear exactly what occurred in the last 5 years.  But over the last several months he endorses worsening dyspnea on exertion.  Just walking around going to the gym he has become short of breath.  Endorses severe shortness of breath.  This is resolved within a minute or 2 of albuterol administration.  In general his cough is much better than prior.  Suspect he is adhered well to Joseph Harvey instructions regarding his airway cough syndrome.  He does feel like he has significant nasal congestion and postnasal drip.  This produces mucus in the back of her throat needs to clear her cough up but feels much different than his prior cough.  Scheduled to have sinus surgery but this was discontinued due to wheezing per his report.  His insurance is changed and now needs to find a new ENT doctor.  When he gets bad bouts of that mucus buildup he will take a dose of prednisone and within a minute, instantly the mucus feels better.  In terms of his dyspnea, rest improves his dyspnea.  There is no other aggravating or alleviating factors.  He has been on Breo for the last several months and says he thinks it helps somewhat with his breathing but is not at the level it was when he was followed by pulmonary prior.  Prior PFTs reviewed and interpreted as suggestive of mild restriction on spirometry, no fixed obstruction, lung volumes revealed TLC of 86%  predicted, within normal limits or mildly reduced.  DLCO within normal limits.  Most recent chest x-ray 06/2018 reviewed instructed is clear lungs, current hyperinflation on PA film does not appear hyperinflated on lateral film.  PMH: Anxiety, obesity, diabetes, tobacco abuse, prostate cancer Surgical history: Lumbar back surgery Family History: Joseph Harvey with colon and lung cancer, Joseph Harvey with lung cancer Social history: Grew up in [place], lived there for 53 years, current smoker, 20+ pack year history   Questionaires / Pulmonary Flowsheets:   ACT:  Asthma Control Test ACT Total Score  02/26/2020 9    MMRC: No flowsheet data found.  Epworth:  No flowsheet data found.  Tests:   FENO:  No results found for: NITRICOXIDE  PFT: No flowsheet data found.  WALK:  No flowsheet data found.  Imaging: Reviewed as per EMR  Lab Results: Personally reviewed, no significant elevation of eosinophils, IgE and RAST panel negative in past CBC    Component Value Date/Time   WBC 10.7 11/15/2019 0935   WBC 6.7 06/20/2018 0309   RBC 5.14 11/15/2019 0935   RBC 5.24 06/20/2018 0309   HGB 16.0 11/15/2019 0935   HCT 47.8 11/15/2019 0935   PLT 361 11/15/2019 0935   MCV 93 11/15/2019 0935   MCH 31.1 11/15/2019 0935   MCH 31.3 06/20/2018 0309   MCHC 33.5 11/15/2019 0935   MCHC 34.0 06/20/2018 0309  RDW 12.4 11/15/2019 0935   LYMPHSABS 2.9 06/20/2018 0309   MONOABS 0.5 06/20/2018 0309   EOSABS 0.1 06/20/2018 0309   BASOSABS 0.0 06/20/2018 0309    BMET    Component Value Date/Time   NA 138 11/15/2019 0935   K 4.5 11/15/2019 0935   CL 97 11/15/2019 0935   CO2 25 11/15/2019 0935   GLUCOSE 93 11/15/2019 0935   GLUCOSE 109 (H) 06/20/2018 0309   BUN 15 11/15/2019 0935   CREATININE 1.22 11/15/2019 0935   CALCIUM 10.1 11/15/2019 0935   GFRNONAA 63 11/15/2019 0935   GFRAA 72 11/15/2019 0935    BNP    Component Value Date/Time   BNP 25.1 07/24/2014 1256    ProBNP No results  found for: PROBNP  Specialty Problems      Pulmonary Problems   Asthma, severe persistent    Followed in Pulmonary clinic/ [facility]/ Harvey  - stop acei 05/30/2012  - PFT's 07/25/2012  FEV1 2.08 (63%) 77 ratio and no change p B2 ,  DLCO 97 - hfa 90% 08/29/2012  > confirmed 10/29/2012  - allergy profile 12/10/2012 > eos 3.8% with IgE < 5 neg rast - try off singulair 04/29/13  -med calendar 12/27/2012  - 03/06/2015  extensive coaching HFA effectiveness =   90% > rechallenge with symbicort 160  2 bid  - Spirometry 03/16/2015  FEV1 1.25 (40%) ratio 77 p am symbicort       Cough    Followed in Pulmonary clinic/ [facility]/ Harvey  - Sinus CT  07/25/2012  > No evidence for acute or chronic sinus disease.        Allergic rhinitis      Allergies  Allergen Reactions  . Amoxicillin Anaphylaxis    Has patient had a PCN reaction causing immediate rash, facial/tongue/throat swelling, SOB or lightheadedness with hypotension: Yes Has patient had a PCN reaction causing severe rash involving mucus membranes or skin necrosis: No Has patient had a PCN reaction that required hospitalization: Yes Has patient had a PCN reaction occurring within the last 10 years: Yes If all of the above answers are "NO", then may proceed with Cephalosporin use.   Marland Kitchen Lisinopril Shortness Of Breath  . Molds & Smuts Anaphylaxis  . Robitussin [Guaifenesin] Shortness Of Breath    wheezing  . Azithromycin Other (See Comments)    Immunization History  Administered Date(s) Administered  . DTaP 03/16/2012  . Influenza Split 02/07/2013, 02/14/2015  . Influenza Whole 04/15/2012, 02/10/2020  . Influenza, Seasonal, Injecte, Preservative Fre 02/07/2013, 02/14/2015, 01/21/2016, 01/27/2017  . Influenza,inj,Quad PF,6+ Mos 01/28/2019  . Influenza-Unspecified 02/07/2013, 02/14/2015, 01/21/2016, 12/22/2016, 01/27/2017  . PFIZER SARS-COV-2 Vaccination 07/29/2019, 08/19/2019, 01/07/2020  . Pneumococcal Conjugate-13  03/16/2015  . Pneumococcal Polysaccharide-23 03/16/2012, 05/16/2013  . Tdap 12/19/2016  . Zoster Recombinat (Shingrix) 12/19/2016, 02/27/2017    Past Medical History:  Diagnosis Date  . Allergy    Dust, mold, dust mites  . Anemia   . Asthma   . Cancer [facility])    prostate  . Cataract    bilateral repair.  . Diabetes mellitus without complication ([place])   . Glaucoma   . Hyperlipidemia   . Hypertension   . Neuromuscular disorder ([place])    nerve damage from back surgery  . Pneumonia     Tobacco History: Social History   Tobacco Use  Smoking Status Current Every Day Smoker  . Packs/day: 0.50  . Years: 46.00  . Pack years: 23.00  . Types: Cigarettes  .  Start date: 1976  Smokeless Tobacco Never Used   Ready to quit: Not Answered Counseling given: Not Answered     Outpatient Encounter Medications as of 02/26/2020  Medication Sig  . albuterol (PROVENTIL) (2.5 MG/3ML) 0.083% nebulizer solution TAKE 3 MLS (2.5 MG TOTAL) BY NEBULIZATION EVERY 4 (FOUR) HOURS AS NEEDED FOR WHEEZING OR SHORTNESS OF BREATH (((PLAN B))).  Marland Kitchen amLODipine (NORVASC) 5 MG tablet Take 2 tablets daily for blood pressure.  . ASPIRIN 81 PO Take 81 mg by mouth daily.  . baclofen (LIORESAL) 10 MG tablet Take 10 mg by mouth daily.   . Blood Glucose Monitoring Suppl (TRUE METRIX METER) w/Device KIT Use as directed  . busPIRone (BUSPAR) 5 MG tablet Take 1 tablet (5 mg total) by mouth 3 (three) times daily.  Marland Kitchen BYSTOLIC 20 MG TABS TAKE 1 TABLET (20 MG TOTAL) BY MOUTH 2 (TWO) TIMES DAILY.  Marland Kitchen Cholecalciferol (VITAMIN D PO) Take 1 tablet by mouth daily.  Marland Kitchen docusate sodium (COLACE) 100 MG capsule Take 1 capsule (100 mg total) by mouth 2 (two) times daily as needed for mild constipation.  . fluticasone (FLONASE) 50 MCG/ACT nasal spray PLACE 2 SPRAYS INTO BOTH NOSTRILS DAILY.  Marland Kitchen gabapentin (NEURONTIN) 300 MG capsule Take 1 capsule (300 mg total) by mouth daily as needed.  Marland Kitchen glucose blood (TRUE METRIX BLOOD GLUCOSE TEST)  test strip Use as instructed  . ipratropium-albuterol (DUONEB) 0.5-2.5 (3) MG/3ML SOLN USE VIAL BY NEBULIZATION 2 (TWO) TIMES DAILY.  . metFORMIN (GLUCOPHAGE-XR) 500 MG 24 hr tablet TAKE 1 TABLET BY MOUTH DAILY.  . montelukast (SINGULAIR) 10 MG tablet TAKE 1 TABLET (10 MG TOTAL) BY MOUTH AT BEDTIME.  . pantoprazole (PROTONIX) 40 MG tablet TAKE 1 TABLET (40 MG TOTAL) BY MOUTH DAILY.  Marland Kitchen polyethylene glycol powder (GLYCOLAX/MIRALAX) 17 GM/SCOOP powder Take 17 g by mouth daily as needed.  . potassium chloride SA (KLOR-CON M20) 20 MEQ tablet Take 20 mEq by mouth as needed. When hands cramp up  . predniSONE (DELTASONE) 10 MG tablet TAKE 3 TABLETS BY MOUTH DAILY FOR 2 DAYS,THEN 2 TABS DAILY FOR 2 DAYS,THEN 1 TABS DAILY FOR 2 DAYS,  . Respiratory Therapy Supplies (FLUTTER) DEVI Use three times a day after inhaler or nebulizer use  . rosuvastatin (CRESTOR) 20 MG tablet Take 1 tablet (20 mg total) by mouth daily.  Marland Kitchen triamterene-hydrochlorothiazide (DYAZIDE) 37.5-25 MG capsule Take 1 each (1 capsule total) by mouth daily.  . TRUEplus Lancets 28G MISC Use as directed  . VENTOLIN HFA 108 (90 Base) MCG/ACT inhaler INHALE 2 PUFFS INTO THE LUNGS EVERY 4 (FOUR) HOURS AS NEEDED FOR WHEEZING OR SHORTNESS OF BREATH.  . [DISCONTINUED] fluticasone furoate-vilanterol (BREO ELLIPTA) 200-25 MCG/INH AEPB Inhale 1 puff into the lungs daily.  . budesonide-formoterol (SYMBICORT) 160-4.5 MCG/ACT inhaler Inhale 2 puffs into the lungs in the morning and at bedtime.  . [DISCONTINUED] doxycycline (VIBRA-TABS) 100 MG tablet TAKE 1 TABLET BY MOUTH TWICE DAILY FOR 10 DAYS  . [DISCONTINUED] loratadine (CLARITIN) 10 MG tablet Take 1 tablet (10 mg total) by mouth daily.   No facility-administered encounter medications on file as of 02/26/2020.     Review of Systems  Review of Systems  Denies chest pain with exertion.  No leg swelling, no orthopnea, PND. Physical Exam  BP 140/76 (BP Location: Right Arm, Cuff Size: Normal)    Pulse (!) 101   Temp 98.1 F (36.7 C) (Temporal)   Ht '5\' 9"'  (1.753 m)   Wt 243 lb (110.2 kg)  SpO2 97%   BMI 35.88 kg/m   Wt Readings from Last 5 Encounters:  02/26/20 243 lb (110.2 kg)  11/15/19 247 lb (112 kg)  10/03/19 248 lb (112.5 kg)  09/19/19 248 lb (112.5 kg)  12/10/18 231 lb (104.8 kg)    BMI Readings from Last 5 Encounters:  02/26/20 35.88 kg/m  11/15/19 36.48 kg/m  10/03/19 36.62 kg/m  09/19/19 36.62 kg/m  12/10/18 34.11 kg/m     Physical Exam General: Well-appearing, [place] exam chair Eyes: EOMI, icterus Neck: No JVP appreciated, neck supple, Respiratory: Clear to oscillation bilaterally, no wheeze Cardiovascular: Tachycardic, regular rhythm, no murmurs Abdomen: Nondistended, bowel sounds present MSK: No joint effusion, no synovitis Neuro: Normal gait, no weakness Psych: Normal mood, full affect    Assessment & Plan:   Dyspnea on exertion: Suspect multifactorial.  Unclear how much asthma is contributing.  He is not wheezing today is on good therapies.  Further discussion as below regarding asthma.  Other contributors include suspected anxiety/hyperventilation syndrome, obesity, deconditioning.  Denies any cardiac work-up, this must be considered in someone who is obese, diabetic, with hyperlipidemia.  Will obtain repeat PFTs in the coming weeks.  Echocardiogram ordered to start cardiac work-up.  Likely would benefit from stress test in the future.  Recommend continued aggressive treatment of his anxiety given placebo-like reaction immediately to albuterol and prednisone.  He feels symptomatically improved with the addition of BuSpar recently.  Asthma: He has atopic symptoms.  He seems to have done better on HFA in the past.  Will discontinue Breo and resume high-dose Symbicort.  To continue albuterol as needed as well as Singulair.  Do worry that sinus disease is contributing to possibly poorly controlled asthma.  Encouraged him to follow-up with new ENT  doctor and pursue sinus surgery if indicated.  Will obtain repeat eosinophils, IgE, RAST panel to evaluate candidacy for Biologics.  These were negative in the past.   Return in about 3 months (around 05/28/2020).   Lanier Clam, MD 02/26/2020

## 2020-02-27 ENCOUNTER — Other Ambulatory Visit: Payer: Self-pay | Admitting: *Deleted

## 2020-02-27 DIAGNOSIS — J4550 Severe persistent asthma, uncomplicated: Secondary | ICD-10-CM

## 2020-02-27 LAB — IGE: IgE (Immunoglobulin E), Serum: 2 kU/L (ref <=114)

## 2020-02-28 ENCOUNTER — Ambulatory Visit (INDEPENDENT_AMBULATORY_CARE_PROVIDER_SITE_OTHER): Payer: 59 | Admitting: Pulmonary Disease

## 2020-02-28 ENCOUNTER — Other Ambulatory Visit: Payer: Self-pay

## 2020-02-28 DIAGNOSIS — J4550 Severe persistent asthma, uncomplicated: Secondary | ICD-10-CM | POA: Diagnosis not present

## 2020-02-28 DIAGNOSIS — R0609 Other forms of dyspnea: Secondary | ICD-10-CM

## 2020-02-28 DIAGNOSIS — R0600 Dyspnea, unspecified: Secondary | ICD-10-CM

## 2020-02-28 LAB — ALLERGEN PROFILE, PERENNIAL ALLERGEN IGE

## 2020-02-28 LAB — PULMONARY FUNCTION TEST
DL/VA % pred: 123 %
DL/VA: 5.16 ml/min/mmHg/L
DLCO cor % pred: 82 %
DLCO cor: 22.15 ml/min/mmHg
DLCO unc % pred: 80 %
DLCO unc: 21.37 ml/min/mmHg
FEF 25-75 Post: 1.69 L/sec
FEF 25-75 Pre: 0.96 L/sec
FEF2575-%Change-Post: 76 %
FEF2575-%Pred-Post: 61 %
FEF2575-%Pred-Pre: 34 %
FEV1-%Change-Post: 18 %
FEV1-%Pred-Post: 56 %
FEV1-%Pred-Pre: 47 %
FEV1-Post: 1.7 L
FEV1-Pre: 1.43 L
FEV1FVC-%Change-Post: 6 %
FEV1FVC-%Pred-Pre: 89 %
FEV6-%Change-Post: 11 %
FEV6-%Pred-Post: 61 %
FEV6-%Pred-Pre: 55 %
FEV6-Post: 2.3 L
FEV6-Pre: 2.06 L
FEV6FVC-%Pred-Post: 104 %
FEV6FVC-%Pred-Pre: 104 %
FVC-%Change-Post: 11 %
FVC-%Pred-Post: 59 %
FVC-%Pred-Pre: 52 %
FVC-Post: 2.3 L
FVC-Pre: 2.06 L
Post FEV1/FVC ratio: 74 %
Post FEV6/FVC ratio: 100 %
Pre FEV1/FVC ratio: 70 %
Pre FEV6/FVC Ratio: 100 %

## 2020-02-28 NOTE — Progress Notes (Signed)
PFT done today. 

## 2020-03-16 ENCOUNTER — Other Ambulatory Visit: Payer: Self-pay | Admitting: Critical Care Medicine

## 2020-03-16 MED FILL — DOXYCYCLINE HYCLATE 100 MG: 100 | 10 days supply | Qty: 20 | Fill #2

## 2020-03-16 MED FILL — MONTELUKAST SOD 10 MG TAB: 10 | 30 days supply | Qty: 30 | Fill #0

## 2020-03-17 ENCOUNTER — Encounter: Payer: Self-pay | Admitting: Internal Medicine

## 2020-03-17 ENCOUNTER — Other Ambulatory Visit: Payer: Self-pay

## 2020-03-17 ENCOUNTER — Ambulatory Visit: Payer: 59 | Attending: Internal Medicine | Admitting: Internal Medicine

## 2020-03-17 ENCOUNTER — Other Ambulatory Visit: Payer: Self-pay | Admitting: Internal Medicine

## 2020-03-17 VITALS — BP 135/77 | HR 91 | Resp 16 | Wt 243.2 lb

## 2020-03-17 DIAGNOSIS — I10 Essential (primary) hypertension: Secondary | ICD-10-CM

## 2020-03-17 DIAGNOSIS — F410 Panic disorder [episodic paroxysmal anxiety] without agoraphobia: Secondary | ICD-10-CM | POA: Diagnosis not present

## 2020-03-17 DIAGNOSIS — E669 Obesity, unspecified: Secondary | ICD-10-CM

## 2020-03-17 DIAGNOSIS — Z8546 Personal history of malignant neoplasm of prostate: Secondary | ICD-10-CM | POA: Diagnosis not present

## 2020-03-17 DIAGNOSIS — J4550 Severe persistent asthma, uncomplicated: Secondary | ICD-10-CM

## 2020-03-17 DIAGNOSIS — R0600 Dyspnea, unspecified: Secondary | ICD-10-CM

## 2020-03-17 DIAGNOSIS — R0609 Other forms of dyspnea: Secondary | ICD-10-CM

## 2020-03-17 DIAGNOSIS — E1169 Type 2 diabetes mellitus with other specified complication: Secondary | ICD-10-CM | POA: Diagnosis not present

## 2020-03-17 DIAGNOSIS — R7989 Other specified abnormal findings of blood chemistry: Secondary | ICD-10-CM

## 2020-03-17 LAB — GLUCOSE, POCT (MANUAL RESULT ENTRY): POC Glucose: 163 mg/dl — AB (ref 70–99)

## 2020-03-17 MED FILL — BYSTOLIC 20 MG TABLET: 20 | 90 days supply | Qty: 180 | Fill #0

## 2020-03-17 NOTE — Telephone Encounter (Signed)
Requested Prescriptions  Pending Prescriptions Disp Refills   predniSONE (DELTASONE) 10 MG tablet [Pharmacy Med Name: predniSONE 10 MG TABS 10 Tablet] 12 tablet 0    Sig: TAKE 3 TABLETS BY MOUTH DAILY FOR 2 DAYS,THEN 2 TABS DAILY FOR 2 DAYS,THEN 1 TABS DAILY FOR 2 DAYS,     Not Delegated - Endocrinology:  Oral Corticosteroids Failed - 03/17/2020 10:18 AM      Failed - This refill cannot be delegated      Passed - Last BP in normal range    BP Readings from Last 1 Encounters:  03/17/20 135/77         Passed - Valid encounter within last 6 months    Recent Outpatient Visits          Today Type 2 diabetes mellitus with obesity ([place])   [place] Ladell Pier, MD   2 months ago Essential hypertension   [place], Jarome Matin, RPH-CPP   2 months ago Panic disorder   [place], MD   3 months ago Essential hypertension   [place], Annie Main L, RPH-CPP   4 months ago Type 2 diabetes mellitus with obesity [facility])   [place], Deborah B, MD              BYSTOLIC 20 MG TABS [Pharmacy Med Name: BYSTOLIC 20 MG TABLET 20 Tablet] 180 tablet 0    Sig: TAKE 1 TABLET (20 MG TOTAL) BY MOUTH 2 (TWO) TIMES DAILY.     Cardiovascular:  Beta Blockers Passed - 03/17/2020 10:18 AM      Passed - Last BP in normal range    BP Readings from Last 1 Encounters:  03/17/20 135/77         Passed - Last Heart Rate in normal range    Pulse Readings from Last 1 Encounters:  03/17/20 91         Passed - Valid encounter within last 6 months    Recent Outpatient Visits          Today Type 2 diabetes mellitus with obesity ([place])   [place], MD   2 months ago Essential hypertension   [place], Jarome Matin, RPH-CPP   2 months ago Panic disorder   [place], MD   3 months ago Essential hypertension   [place], Annie Main L, RPH-CPP   4 months ago Type 2 diabetes mellitus with obesity [facility])   [facility] [facility] Ladell Pier, MD

## 2020-03-17 NOTE — Progress Notes (Signed)
Patient ID: Joseph Harvey, male    DOB: [birth year]  MRN: [id number]  CC: Diabetes and Hypertension   Subjective: Joseph Harvey is a 64 y.o. 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  Low Testosterone -patient reports having had his testosterone level checked by a company called [employer].  He was told that the level was low.  He wants to know whether I can provide treatment for the low testosterone.  He does not recall the level.  Panic disorder: Reports that he is doing much better on the BuSpar.  Attacks do not occur as frequently.    HTN: Reports compliance with Norvasc, Dyazide and Bystolic (he prefers capsule on the Bystolic).  He checks his blood pressure intermittently and reports that his numbers have been up and down.  Gives range of 120-140/70-80.  Checking 2 x a wk Compliant with meds.     DM/Obesity:  Not checking BS. Takes Metformin when he is on Prednisone and when he feels BS high. Feels he is doing okay with his eating habits.  Weight is down 4 pounds since July.  Lab Results  Component Value Date   HGBA1C 6.5 11/15/2019   Asthma: Saw the pulmonologist Dr. Silas Flood on the 13th of last month.  He feels that the patient's dyspnea on exertion could be multifactorial including asthma/hyperventilation syndrome, obesity, deconditioning.  He also thinks that he may need a cardiology work-up as he does have risk factors.  He is ordered an echo which the patient will have done tomorrow.   -He change patient from [facility] to high-dose Symbicort.  He encouraged him to follow-up with an ENT doctor as sinus disease may be contributing.  IgE level and perennial allergy tests were normal.  CBC was normal.  Patient Active  Problem List   Diagnosis Date Noted  . Panic disorder 11/15/2019  . Low back pain 09/25/2018  . Lumbar radiculopathy 09/25/2018  . Constipation 09/17/2018  . History of prostate cancer 09/17/2018  . Hyperlipidemia associated with type 2 diabetes mellitus ([place]) 09/17/2018  . History of paranasal sinus congestion 07/20/2018  . Tobacco dependence 07/20/2018  . Controlled type 2 diabetes mellitus without complication, without long-term current use of insulin ([place]) 07/20/2018  . Vitamin D deficiency 08/18/2016  . Hyperlipidemia LDL goal <100 08/10/2016  . Family history of colon cancer 09/30/2015  . GERD (gastroesophageal reflux disease) 09/30/2015  . History of colon polyps 09/30/2015  . Obesity, morbid ([place]) 03/07/2015  . Allergic rhinitis 12/15/2014  . Malignant neoplasm of prostate ([place]) 06/02/2014  . Cough 07/25/2012  . HBP (high blood pressure) 06/01/2012  . Asthma, severe persistent 05/30/2012     Current Outpatient Medications on File Prior to Visit  Medication Sig Dispense Refill  . albuterol (PROVENTIL) (2.5 MG/3ML) 0.083% nebulizer solution TAKE 3 MLS (2.5 MG TOTAL) BY NEBULIZATION EVERY 4 (FOUR) HOURS AS NEEDED FOR WHEEZING OR SHORTNESS OF BREATH (((PLAN B))). 150 mL 3  . amLODipine (NORVASC) 5 MG tablet Take 2 tablets daily for blood pressure. 180 tablet 1  . ASPIRIN 81 PO Take 81 mg by mouth daily.    . baclofen (LIORESAL) 10 MG tablet Take 10 mg by mouth daily.     . Blood Glucose Monitoring Suppl (TRUE METRIX METER) w/Device KIT Use as directed 1 kit  0  . budesonide-formoterol (SYMBICORT) 160-4.5 MCG/ACT inhaler Inhale 2 puffs into the lungs in the morning and at bedtime. 1 each 11  . busPIRone (BUSPAR) 5 MG tablet Take 1 tablet (5 mg total) by mouth 3 (three) times daily. 90 tablet 5  . Cholecalciferol (VITAMIN D PO) Take 1 tablet by mouth daily.    Marland Kitchen docusate sodium (COLACE) 100 MG capsule Take 1 capsule (100 mg total) by mouth 2 (two) times daily as needed for mild  constipation. 10 capsule 0  . fluticasone (FLONASE) 50 MCG/ACT nasal spray PLACE 2 SPRAYS INTO BOTH NOSTRILS DAILY. 16 g 11  . gabapentin (NEURONTIN) 300 MG capsule Take 1 capsule (300 mg total) by mouth daily as needed. 90 capsule 1  . glucose blood (TRUE METRIX BLOOD GLUCOSE TEST) test strip Use as instructed 100 each 12  . ipratropium-albuterol (DUONEB) 0.5-2.5 (3) MG/3ML SOLN USE VIAL BY NEBULIZATION 2 (TWO) TIMES DAILY. 180 mL 1  . metFORMIN (GLUCOPHAGE-XR) 500 MG 24 hr tablet TAKE 1 TABLET BY MOUTH DAILY. 90 tablet 0  . montelukast (SINGULAIR) 10 MG tablet TAKE 1 TABLET (10 MG TOTAL) BY MOUTH AT BEDTIME. 30 tablet 3  . pantoprazole (PROTONIX) 40 MG tablet TAKE 1 TABLET (40 MG TOTAL) BY MOUTH DAILY. 30 tablet 0  . polyethylene glycol powder (GLYCOLAX/MIRALAX) 17 GM/SCOOP powder Take 17 g by mouth daily as needed. 3350 g 1  . potassium chloride SA (KLOR-CON M20) 20 MEQ tablet Take 20 mEq by mouth as needed. When hands cramp up    . predniSONE (DELTASONE) 10 MG tablet TAKE 3 TABLETS BY MOUTH DAILY FOR 2 DAYS,THEN 2 TABS DAILY FOR 2 DAYS,THEN 1 TABS DAILY FOR 2 DAYS, 12 tablet 0  . Respiratory Therapy Supplies (FLUTTER) DEVI Use three times a day after inhaler or nebulizer use 1 each 0  . rosuvastatin (CRESTOR) 20 MG tablet Take 1 tablet (20 mg total) by mouth daily. 30 tablet 6  . triamterene-hydrochlorothiazide (DYAZIDE) 37.5-25 MG capsule Take 1 each (1 capsule total) by mouth daily. 30 capsule 6  . TRUEplus Lancets 28G MISC Use as directed 100 each 4  . VENTOLIN HFA 108 (90 Base) MCG/ACT inhaler INHALE 2 PUFFS INTO THE LUNGS EVERY 4 (FOUR) HOURS AS NEEDED FOR WHEEZING OR SHORTNESS OF BREATH. 18 g 0   No current facility-administered medications on file prior to visit.    Allergies  Allergen Reactions  . Amoxicillin Anaphylaxis    Has patient had a PCN reaction causing immediate rash, facial/tongue/throat swelling, SOB or lightheadedness with hypotension: Yes Has patient had a PCN  reaction causing severe rash involving mucus membranes or skin necrosis: No Has patient had a PCN reaction that required hospitalization: Yes Has patient had a PCN reaction occurring within the last 10 years: Yes If all of the above answers are "NO", then may proceed with Cephalosporin use.   Marland Kitchen Lisinopril Shortness Of Breath  . Molds & Smuts Anaphylaxis  . Robitussin [Guaifenesin] Shortness Of Breath    wheezing  . Azithromycin Other (See Comments)    Social History   Socioeconomic History  . Marital status: Single    Spouse name: Not on file  . Number of children: 2  . Years of education: 12 grade  . Highest education level: Not on file  Occupational History  . 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 Smoking status: Current Every Day Smoker    Packs/day: 0.50    Years: 46.00  Pack years: 23.00    Types: Cigarettes    Start date: 84  . Smokeless tobacco: Never Used  Vaping Use  . Vaping Use: Never used  Substance and Sexual Activity  . Alcohol use: Yes    Alcohol/week: 3.0 standard drinks    Types: 3 Cans of beer per week    Comment: rarely  . Drug use: No  . Sexual activity: Not Currently  Other Topics Concern  . Not on file  Social History Narrative  . Not on file   Social Determinants of Health   Financial Resource Strain:   . Difficulty of Paying Living Expenses: Not on file  Food Insecurity:   . Worried About Charity fundraiser in the Last Year: Not on file  . Ran Out of Food in the Last Year: Not on file  Transportation Needs:   . Lack of Transportation (Medical): Not on file  . Lack of Transportation (Non-Medical): Not on file  Physical Activity:   . Days of Exercise per Week: Not on file  . Minutes of Exercise per Session: Not on file  Stress:   . Feeling of Stress : Not on file  Social Connections:   . Frequency of Communication with Friends and Family: Not on file  . Frequency of Social Gatherings with Friends and  Family: Not on file  . Attends Religious Services: Not on file  . Active Member of Clubs or Organizations: Not on file  . Attends Archivist Meetings: Not on file  . Marital Status: Not on file  Intimate Partner Violence:   . Fear of Current or Ex-Partner: Not on file  . Emotionally Abused: Not on file  . Physically Abused: Not on file  . Sexually Abused: Not on file    Family History  Problem Relation Age of Onset  . Colon cancer Father   . Lung cancer Father        was a smoker  . Prostate cancer Father   . Lung cancer Mother        was a smoker  . Asthma Mother   . Allergies Brother   . Diabetes Maternal Grandmother   . Colon polyps Neg Hx   . Esophageal cancer Neg Hx   . Stomach cancer Neg Hx   . Rectal cancer Neg Hx     Past Surgical History:  Procedure Laterality Date  . BACK SURGERY  2017  . CATARACT EXTRACTION Bilateral   . COLONOSCOPY     2013  . COLONOSCOPY  10/03/2019  . KNEE ARTHROSCOPY  2005   right  . PROSTATE SURGERY    . ROBOT ASSISTED LAPAROSCOPIC RADICAL PROSTATECTOMY  03/2010    ROS: Review of Systems Negative except as stated above  PHYSICAL EXAM: BP 135/77   Pulse 91   Resp 16   Wt 243 lb 3.2 oz (110.3 kg)   SpO2 95%   BMI 35.91 kg/m   Wt Readings from Last 3 Encounters:  03/17/20 243 lb 3.2 oz (110.3 kg)  02/26/20 243 lb (110.2 kg)  11/15/19 247 lb (112 kg)    Physical Exam  General appearance - alert, well appearing, and in no distress Mental status - normal mood, behavior, speech, dress, motor activity, and thought processes Neck - supple, no significant adenopathy Chest - clear to auscultation, no wheezes, rales or rhonchi, symmetric air entry Heart - normal rate, regular rhythm, normal S1, S2, no murmurs, rubs, clicks or gallops Extremities - peripheral pulses normal, no  pedal edema, no clubbing or cyanosis Diabetic Foot Exam - Simple   Simple Foot Form Visual Inspection No deformities, no ulcerations, no other  skin breakdown bilaterally: Yes Sensation Testing Intact to touch and monofilament testing bilaterally: Yes Pulse Check Posterior Tibialis and Dorsalis pulse intact bilaterally: Yes Comments     Results for orders placed or performed in visit on 03/17/20  POCT glucose (manual entry)  Result Value Ref Range   POC Glucose 163 (A) 70 - 99 mg/dl    CMP Latest Ref Rng & Units 11/15/2019 06/20/2018 10/14/2017  Glucose 65 - 99 mg/dL 93 109(H) 113(H)  BUN 8 - 27 mg/dL _0 Creatinine 0.76 - 1.27 mg/dL 1.22 1.22 0.98  Sodium 134 - 144 mmol/L 138 134(L) 141  Potassium 3.5 - 5.2 mmol/L 4.5 3.4(L) 3.9  Chloride 96 - 106 mmol/L 97 100 106  CO2 20 - 29 mmol/L _1 Calcium 8.6 - 10.2 mg/dL 10.1 9.2 8.9  Total Protein 6.0 - 8.5 g/dL 7.7 - -  Total Bilirubin 0.0 - 1.2 mg/dL 0.5 - -  Alkaline Phos 48 - 121 IU/L 67 - -  AST 0 - 40 IU/L 21 - -  ALT 0 - 44 IU/L 21 - -   Lipid Panel     Component Value Date/Time   CHOL 278 (H) 11/15/2019 0935   TRIG 370 (H) 11/15/2019 0935   HDL 53 11/15/2019 0935   CHOLHDL 5.2 (H) 11/15/2019 0935   LDLCALC 156 (H) 11/15/2019 0935    CBC    Component Value Date/Time   WBC 8.7 02/26/2020 1007   RBC 4.99 02/26/2020 1007   HGB 16.4 02/26/2020 1007   HGB 16.0 11/15/2019 0935   HCT 46.8 02/26/2020 1007   HCT 47.8 11/15/2019 0935   PLT 387.0 02/26/2020 1007   PLT 361 11/15/2019 0935   MCV 93.8 02/26/2020 1007   MCV 93 11/15/2019 0935   MCH 31.1 11/15/2019 0935   MCH 31.3 06/20/2018 0309   MCHC 35.0 02/26/2020 1007   RDW 12.6 02/26/2020 1007   RDW 12.4 11/15/2019 0935   LYMPHSABS 3.2 02/26/2020 1007   MONOABS 0.7 02/26/2020 1007   EOSABS 0.1 02/26/2020 1007   BASOSABS 0.2 (H) 02/26/2020 1007    ASSESSMENT AND PLAN: 1. Type 2 diabetes mellitus with obesity ([place]) Courage healthy eating habits.  He plans to start exercising once his asthma he feels is better controlled.  He will continue to use the Metformin during the times that he takes  prednisone. - POCT glucose (manual entry)  2. Essential hypertension Close to goal.  No changes made in his medications.  I suspect blood pressure may go up sometimes when he is on prednisone.  3. Panic disorder Doing well on BuSpar.  4. History of prostate cancer 5. Low testosterone I recommend that patient see his urologist about the low testosterone level given his history of prostate cancer. - Ambulatory referral to Urology  6. Severe persistent asthma without complication Now plugged in with pulmonary.   7. DOE (dyspnea on exertion) Patient scheduled for echo tomorrow as ordered by pulmonary.  I told him that I can submit a referral for him to see a cardiologist for work-up to make sure that he does not have any heart disease contributing to this issue.  However patient states he will wait until he gets the results from the echo.    Patient was given the opportunity to ask questions.  Patient verbalized understanding of the  plan and was able to repeat key elements of the plan.   Orders Placed This Encounter  Procedures  . Ambulatory referral to Urology  . POCT glucose (manual entry)     Requested Prescriptions    No prescriptions requested or ordered in this encounter    Return in about 4 months (around 07/15/2020).  Karle Plumber, MD, FACP

## 2020-03-17 NOTE — Telephone Encounter (Signed)
Requested medication (s) are due for refill today: yes  Requested medication (s) are on the active medication list: yes  Last refill:  02/13/20  Future visit scheduled: yes  Notes to clinic: not delegated   Requested Prescriptions  Pending Prescriptions Disp Refills   predniSONE (DELTASONE) 10 MG tablet [Pharmacy Med Name: predniSONE 10 MG TABS 10 Tablet] 12 tablet 0    Sig: TAKE 3 TABLETS BY MOUTH DAILY FOR 2 DAYS,THEN 2 TABS DAILY FOR 2 DAYS,THEN 1 TABS DAILY FOR 2 DAYS,      Not Delegated - Endocrinology:  Oral Corticosteroids Failed - 03/17/2020 10:18 AM      Failed - This refill cannot be delegated      Passed - Last BP in normal range    BP Readings from Last 1 Encounters:  03/17/20 135/77          Passed - Valid encounter within last 6 months    Recent Outpatient Visits           Today Type 2 diabetes mellitus with obesity ([place])   [place] Ladell Pier, MD   2 months ago Essential hypertension   [place], Jarome Matin, RPH-CPP   2 months ago Panic disorder   [place], MD   3 months ago Essential hypertension   [place], Annie Main L, RPH-CPP   4 months ago Type 2 diabetes mellitus with obesity [facility])   [place] Ladell Pier, MD               Signed Prescriptions Disp Refills   BYSTOLIC 20 MG TABS 270 tablet 0    Sig: TAKE 1 TABLET (20 MG TOTAL) BY MOUTH 2 (TWO) TIMES DAILY.      Cardiovascular:  Beta Blockers Passed - 03/17/2020 10:18 AM      Passed - Last BP in normal range    BP Readings from Last 1 Encounters:  03/17/20 135/77          Passed - Last Heart Rate in normal range    Pulse Readings from Last 1 Encounters:  03/17/20 91          Passed - Valid encounter within last 6 months    Recent Outpatient Visits           Today Type  2 diabetes mellitus with obesity ([place])   [place], MD   2 months ago Essential hypertension   [place], Jarome Matin, RPH-CPP   2 months ago Panic disorder   [place], MD   3 months ago Essential hypertension   [place], Annie Main L, RPH-CPP   4 months ago Type 2 diabetes mellitus with obesity [facility])   [place] [facility] Ladell Pier, MD

## 2020-03-18 ENCOUNTER — Ambulatory Visit (HOSPITAL_COMMUNITY): Payer: 59 | Attending: Cardiovascular Disease

## 2020-03-18 DIAGNOSIS — R0600 Dyspnea, unspecified: Secondary | ICD-10-CM | POA: Diagnosis present

## 2020-03-18 DIAGNOSIS — R0609 Other forms of dyspnea: Secondary | ICD-10-CM

## 2020-03-18 LAB — ECHOCARDIOGRAM COMPLETE
Area-P 1/2: 4.62 cm2
S' Lateral: 2.3 cm

## 2020-03-18 MED ORDER — PERFLUTREN LIPID MICROSPHERE
1.0000 mL | INTRAVENOUS | Status: AC | PRN
  Administered 2020-03-18: 3 mL via INTRAVENOUS

## 2020-03-18 MED FILL — predniSONE 10 MG TABS: 10 | 6 days supply | Qty: 12 | Fill #0

## 2020-03-19 ENCOUNTER — Other Ambulatory Visit: Payer: Self-pay | Admitting: Internal Medicine

## 2020-03-19 NOTE — Telephone Encounter (Signed)
Requested Prescriptions  Pending Prescriptions Disp Refills  . pantoprazole (PROTONIX) 40 MG tablet [Pharmacy Med Name: PANTOPRAZOLE SOD DR 40 MG T 40 Tablet] 90 tablet 0    Sig: TAKE 1 TABLET (40 MG TOTAL) BY MOUTH DAILY.     Gastroenterology: Proton Pump Inhibitors Passed - 03/19/2020 10:22 AM      Passed - Valid encounter within last 12 months    Recent Outpatient Visits          2 days ago Type 2 diabetes mellitus with obesity [facility])   [place] Ladell Pier, MD   2 months ago Essential hypertension   [place], Jarome Matin, RPH-CPP   2 months ago Panic disorder   [place], MD   3 months ago Essential hypertension   [place], Jarome Matin, RPH-CPP   4 months ago Type 2 diabetes mellitus with obesity [facility])   [place], MD      Future Appointments            In 3 months Wynetta Emery, Dalbert Batman, MD [place]

## 2020-03-20 MED FILL — BACLOFEN 10 MG TABLET: 10 | 30 days supply | Qty: 90 | Fill #0

## 2020-03-20 MED FILL — GABAPENTIN 300 MG CAPSULE: 300 | 30 days supply | Qty: 90 | Fill #4

## 2020-03-23 ENCOUNTER — Telehealth: Payer: Self-pay | Admitting: Pulmonary Disease

## 2020-03-23 MED FILL — SYMBICORT 160-4.5 MCG INH: 160-4.5 | 30 days supply | Qty: 10 | Fill #1

## 2020-03-23 NOTE — Telephone Encounter (Signed)
Instructions from last OV from 02/26/20    Return in about 3 months (around 05/28/2020). Nice to meet you!  We will get labs today.  We need more information about your shortness of breath  - schedule breathing tests and a heart ultrasound in the next couple of weeks.  I prescribed Symbicort - the computer said it would be covered by insurance. If not, call us and we will try something else. Stop the breo for now.   Come back for follow up with Dr. Silas Flood in 3 months.      Called and spoke with pt who stated that he is going to run out of his current Symbicort inhaler before he is going to be eligible to get a new one from the pharmacy. Pt said that it looks like there are about 18 doses left in the current inhaler. Rx was filled 02/26/20 and stated to pt that he should be able to get new inhaler by 03/28/20 as he has enough refills on the rx for 1 year. Pt said when he called the pharmacy they told him that he could not get a refill. Stated to pt that I would call pharmacy and then call him back once we have more info and he verbalized understanding.  Called pt's pharmacy and stated to them the info stated by pt. Per pharmacy, pt is eligible to get refill of his Symbicort inhaler at no charge and inhaler is not requiring a PA to be done. Will call pt to let him know that he can get Rx when needed.  Before calling pt to let him know the info stated by pharmacy, pt wanted to know the results of recent echo that was performed. Dr. Silas Flood, please advise.

## 2020-03-23 NOTE — Telephone Encounter (Signed)
[place], Bonna Gains, MD  You 26 minutes ago (3:15 PM)  [facility] Echo showed the heart muscle was thick but pumping normal and that he has a tendency to hang on to a little bit of extra fluid. Otherwise, it looked normal.    Called and spoke with pt letting him know the results of the echo per Dr. Silas Flood and also that he should be able to get refill of Symbicort from pharmacy whenever he needs it and he verbalized understanding. Nothing further needed.

## 2020-03-25 ENCOUNTER — Other Ambulatory Visit: Payer: Self-pay | Admitting: Internal Medicine

## 2020-03-25 ENCOUNTER — Telehealth: Payer: Self-pay | Admitting: Pharmacist

## 2020-03-25 DIAGNOSIS — R0600 Dyspnea, unspecified: Secondary | ICD-10-CM

## 2020-03-25 DIAGNOSIS — R0609 Other forms of dyspnea: Secondary | ICD-10-CM

## 2020-03-25 DIAGNOSIS — R931 Abnormal findings on diagnostic imaging of heart and coronary circulation: Secondary | ICD-10-CM

## 2020-03-25 MED FILL — VENTOLIN HFA 90 MCG INHALER: 108 (90 BAS | 25 days supply | Qty: 18 | Fill #0

## 2020-03-25 MED FILL — TRIAMTERENE-HCTZ 37.5-25 MG: 37.5-25 | 30 days supply | Qty: 30 | Fill #3

## 2020-03-25 MED FILL — IPRAT-ALBUT 0.5-3(2.5) MG/3: 0.5-2.5 (3) | 30 days supply | Qty: 180 | Fill #0

## 2020-03-25 MED FILL — ALBUTEROL 0.083% INHAL SOLN: (2.5 MG/3ML | 8 days supply | Qty: 150 | Fill #3

## 2020-03-25 MED FILL — busPIRone HCL 5 MG TABS: 5 | 30 days supply | Qty: 90 | Fill #3

## 2020-03-25 MED FILL — FLUTICASONE PROP 50 MCG SPR: 50 | 30 days supply | Qty: 16 | Fill #2

## 2020-03-25 NOTE — Addendum Note (Signed)
Addended by: Karle Plumber B on: 03/25/2020 12:29 PM   Modules accepted: Orders

## 2020-03-25 NOTE — Telephone Encounter (Signed)
Pt came in requesting Cardio referral. He is concerned regarding results of his recent Echo. Informed him that I will send this to his PCP.

## 2020-03-25 NOTE — Telephone Encounter (Signed)
Requested Prescriptions  Pending Prescriptions Disp Refills  . ipratropium-albuterol (DUONEB) 0.5-2.5 (3) MG/3ML SOLN [Pharmacy Med Name: IPRAT-ALBUT 0.5-3(2.5) MG/3 0.5-2.5 (3) Solution] 180 mL 2    Sig: USE VIAL BY NEBULIZATION 2 (TWO) TIMES DAILY.     Pulmonology:  Combination Products Passed - 03/25/2020  9:04 AM      Passed - Valid encounter within last 12 months    Recent Outpatient Visits          1 week ago Type 2 diabetes mellitus with obesity ([place])   [place] Ladell Pier, MD   2 months ago Essential hypertension   [place], Jarome Matin, RPH-CPP   3 months ago Panic disorder   [place], MD   3 months ago Essential hypertension   [place], Jarome Matin, RPH-CPP   4 months ago Type 2 diabetes mellitus with obesity [facility])   [place], MD      Future Appointments            In 3 months Wynetta Emery, Dalbert Batman, MD [place]           . VENTOLIN HFA 108 (90 Base) MCG/ACT inhaler [Pharmacy Med Name: VENTOLIN HFA 90 MCG INHALER 108 (90 BAS Aerosol] 18 g 2    Sig: INHALE 2 PUFFS INTO THE LUNGS EVERY 4 (FOUR) HOURS AS NEEDED FOR WHEEZING OR SHORTNESS OF BREATH.     Pulmonology:  Beta Agonists Failed - 03/25/2020  9:04 AM      Failed - One inhaler should last at least one month. If the patient is requesting refills earlier, contact the patient to check for uncontrolled symptoms.      Passed - Valid encounter within last 12 months    Recent Outpatient Visits          1 week ago Type 2 diabetes mellitus with obesity [facility])   [place] Ladell Pier, MD   2 months ago Essential hypertension   [place], Jarome Matin, RPH-CPP   3 months ago Panic  disorder   [place], MD   3 months ago Essential hypertension   [place], Jarome Matin, RPH-CPP   4 months ago Type 2 diabetes mellitus with obesity [facility])   [place], MD      Future Appointments            In 3 months Wynetta Emery, Dalbert Batman, MD [place]

## 2020-04-12 NOTE — Progress Notes (Signed)
Cardiology Office Note:    Date:  04/15/2020   ID:  Joseph Harvey, DOB 16-Apr-1956, MRN [id number]  PCP:  Ladell Pier, MD  Cardiologist:  No primary care provider on file.  Electrophysiologist:  None   Referring MD: Ladell Pier, MD   Chief Complaint  Patient presents with  . Shortness of Breath    History of Present Illness:    Joseph Harvey is a 64 y.o. male with a hx of prostate cancer in remission, T2DM, hypertension, hyperlipidemia, [place] use who is referred by Dr. Wynetta Emery for evaluation of dyspnea on exertion.  He reports that he has been having chest pain with minimal exertion.  Also describes having chest pain.  Reports sharp left-sided chest pain, can occur at rest or with exertion.  Typically last for 10 minutes or so.  Also states he has been having occasional lightheadedness, denies any syncope.  Denies any lower extremity edema.  Reports BP has been anywhere from 110s to 160s when he checks at home.  Reports he had a prior test for sleep apnea which was negative.  He has smoked since age 4, less than 1 pack/day.  No known history of heart disease in his immediate family.  Echocardiogram on 03/18/2020 showed severe septal hypertrophy with otherwise mild concentric hypertrophy, mild intracavitary gradient (peak gradient 30 mercury), normal biventricular function, grade 1 diastolic dysfunction, no significant valvular disease.    BP Readings from Last 3 Encounters:  04/14/20 (!) 152/94  03/17/20 135/77  02/26/20 140/76    Past Medical History:  Diagnosis Date  . Allergy    Dust, mold, dust mites  . Anemia   . Asthma   . Cancer [facility])    prostate  . Cataract    bilateral repair.  . Diabetes mellitus without complication ([place])   . Glaucoma   . Hyperlipidemia   . Hypertension   . 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 Pneumonia     Past Surgical History:  Procedure Laterality Date  . BACK SURGERY  2017  . CATARACT  EXTRACTION Bilateral   . COLONOSCOPY     2013  . COLONOSCOPY  10/03/2019  . KNEE ARTHROSCOPY  2005   right  . PROSTATE SURGERY    . ROBOT ASSISTED LAPAROSCOPIC RADICAL PROSTATECTOMY  03/2010    Current Medications: Current Meds  Medication Sig  . albuterol (PROVENTIL) (2.5 MG/3ML) 0.083% nebulizer solution TAKE 3 MLS (2.5 MG TOTAL) BY NEBULIZATION EVERY 4 (FOUR) HOURS AS NEEDED FOR WHEEZING OR SHORTNESS OF BREATH (((PLAN B))).  Marland Kitchen amLODipine (NORVASC) 5 MG tablet Take 2 tablets daily for blood pressure.  . ASPIRIN 81 PO Take 81 mg by mouth daily.  . baclofen (LIORESAL) 10 MG tablet Take 10 mg by mouth daily.   . Blood Glucose Monitoring Suppl (TRUE METRIX METER) w/Device KIT Use as directed  . budesonide-formoterol (SYMBICORT) 160-4.5 MCG/ACT inhaler Inhale 2 puffs into the lungs in the morning and at bedtime.  . busPIRone (BUSPAR) 5 MG tablet Take 1 tablet (5 mg total) by mouth 3 (three) times daily.  Marland Kitchen BYSTOLIC 20 MG TABS TAKE 1 TABLET (20 MG TOTAL) BY MOUTH 2 (TWO) TIMES DAILY.  Marland Kitchen Cholecalciferol (VITAMIN D PO) Take 1 tablet by mouth daily.  Marland Kitchen docusate sodium (COLACE) 100 MG capsule Take 1 capsule (100 mg total) by mouth 2 (two) times daily as needed for mild constipation.  Marland Kitchen doxycycline (VIBRA-TABS) 100 MG tablet Take 100 mg by mouth 2 (  two) times daily.  . fluticasone (FLONASE) 50 MCG/ACT nasal spray PLACE 2 SPRAYS INTO BOTH NOSTRILS DAILY.  Marland Kitchen gabapentin (NEURONTIN) 300 MG capsule Take 1 capsule (300 mg total) by mouth daily as needed.  Marland Kitchen glucose blood (TRUE METRIX BLOOD GLUCOSE TEST) test strip Use as instructed  . ipratropium-albuterol (DUONEB) 0.5-2.5 (3) MG/3ML SOLN USE VIAL BY NEBULIZATION 2 (TWO) TIMES DAILY.  . metFORMIN (GLUCOPHAGE-XR) 500 MG 24 hr tablet TAKE 1 TABLET BY MOUTH DAILY.  . montelukast (SINGULAIR) 10 MG tablet TAKE 1 TABLET (10 MG TOTAL) BY MOUTH AT BEDTIME.  . pantoprazole (PROTONIX) 40 MG tablet TAKE 1 TABLET (40 MG TOTAL) BY MOUTH DAILY.  Marland Kitchen polyethylene  glycol powder (GLYCOLAX/MIRALAX) 17 GM/SCOOP powder Take 17 g by mouth daily as needed.  . potassium chloride SA (KLOR-CON M20) 20 MEQ tablet Take 20 mEq by mouth as needed. When hands cramp up  . predniSONE (DELTASONE) 10 MG tablet TAKE 3 TABLETS BY MOUTH DAILY FOR 2 DAYS,THEN 2 TABS DAILY FOR 2 DAYS,THEN 1 TABS DAILY FOR 2 DAYS,  . Respiratory Therapy Supplies (FLUTTER) DEVI Use three times a day after inhaler or nebulizer use  . rosuvastatin (CRESTOR) 20 MG tablet Take 1 tablet (20 mg total) by mouth daily.  Marland Kitchen triamterene-hydrochlorothiazide (DYAZIDE) 37.5-25 MG capsule Take 1 each (1 capsule total) by mouth daily.  . TRUEplus Lancets 28G MISC Use as directed  . VENTOLIN HFA 108 (90 Base) MCG/ACT inhaler INHALE 2 PUFFS INTO THE LUNGS EVERY 4 (FOUR) HOURS AS NEEDED FOR WHEEZING OR SHORTNESS OF BREATH.     Allergies:   Amoxicillin, Lisinopril, Molds & smuts, Robitussin [guaifenesin], and Azithromycin   Social History   Socioeconomic History  . Marital status: Single    Spouse name: Not on file  . Number of children: 2  . Years of education: 12 grade  . Highest education level: Not on file  Occupational History  . 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 Smoking status: Current Every Day Smoker    Packs/day: 0.50    Years: 46.00    Pack years: 23.00    Types: Cigarettes    Start date: 49  . Smokeless tobacco: Never Used  Vaping Use  . Vaping Use: Never used  Substance and Sexual Activity  . Alcohol use: Yes    Alcohol/week: 3.0 standard drinks    Types: 3 Cans of beer per week    Comment: rarely  . Drug use: No  . Sexual activity: Not Currently  Other Topics Concern  . Not on file  Social History Narrative  . Not on file   Social Determinants of Health   Financial Resource Strain:   . Difficulty of Paying Living Expenses: Not on file  Food Insecurity:   . Worried About Charity fundraiser in the Last Year: Not on file  . Ran Out of Food in  the Last Year: Not on file  Transportation Needs:   . Lack of Transportation (Medical): Not on file  . Lack of Transportation (Non-Medical): Not on file  Physical Activity:   . Days of Exercise per Week: Not on file  . Minutes of Exercise per Session: Not on file  Stress:   . Feeling of Stress : Not on file  Social Connections:   . Frequency of Communication with Friends and Family: Not on file  . Frequency of Social Gatherings with Friends and Family: Not on file  . Attends Religious Services: Not  on file  . Active Member of Clubs or Organizations: Not on file  . Attends Archivist Meetings: Not on file  . Marital Status: Not on file     Family History: The patient's family history includes Allergies in his brother; Asthma in his mother; Colon cancer in his father; Diabetes in his maternal grandmother; Lung cancer in his father and mother; Prostate cancer in his father. There is no history of Colon polyps, Esophageal cancer, Stomach cancer, or Rectal cancer.  ROS:   Please see the history of present illness.     All other systems reviewed and are negative.  EKGs/Labs/Other Studies Reviewed:    The following studies were reviewed today:   EKG:  EKG is ordered today.  The ekg ordered today demonstrates normal sinus rhythm, PVCs, no ST abnormalities  Recent Labs: 02/26/2020: Hemoglobin 16.4; Platelets 387.0 04/14/2020: ALT 19; BUN 16; Creatinine, Ser 1.28; Magnesium 2.0; Potassium 4.0; Sodium 140  Recent Lipid Panel    Component Value Date/Time   CHOL 278 (H) 11/15/2019 0935   TRIG 370 (H) 11/15/2019 0935   HDL 53 11/15/2019 0935   CHOLHDL 5.2 (H) 11/15/2019 0935   LDLCALC 156 (H) 11/15/2019 0935    Physical Exam:    VS:  BP (!) 152/94   Pulse 88   Ht _0  (1.753 m)   Wt 236 lb 3.2 oz (107.1 kg)   BMI 34.88 kg/m     Wt Readings from Last 3 Encounters:  04/14/20 236 lb 3.2 oz (107.1 kg)  03/17/20 243 lb 3.2 oz (110.3 kg)  02/26/20 243 lb (110.2 kg)      GEN:  Well nourished, well developed in no acute distress HEENT: Normal NECK: No JVD; No carotid bruits LYMPHATICS: No lymphadenopathy CARDIAC: RRR, irregular, no murmurs, rubs, gallops RESPIRATORY:  Clear to auscultation without rales, wheezing or rhonchi  ABDOMEN: Soft, non-tender, non-distended MUSCULOSKELETAL:  No edema; No deformity  SKIN: Warm and dry NEUROLOGIC:  Alert and oriented x 3 PSYCHIATRIC:  Normal affect   ASSESSMENT:    1. Chest pain of uncertain etiology   2. DOE (dyspnea on exertion)   3. PVC's (premature ventricular contractions)   4. Hyperlipidemia, unspecified hyperlipidemia type   5. Pain in both lower extremities   6. Hypertension, unspecified type   7. LVH (left ventricular hypertrophy)    PLAN:    Chest pain/DOE: Atypical in description but does have CAD risk factors (hypertension, hyperlipidemia, T2DM).  Not a good coronary CTA candidate given frequent PVCs.  Will evaluate for ischemia with Lexiscan Myoview.  PVCs: Frequent PVCs noted on EKG.  Will check CMP, magnesium.  Zio patch x3 days to quantify PVC burden.  Leg pain: Will check ABIs to screen for PAD  LVH: Severe septal LVH on echocardiogram, concerning for HCM -Recommend cardiac MRI to evaluate for HCM.  However he reports he has significant issues with claustrophobia and declines MRI at this time.  Discussed attempting MRI with sedation, but he would like to hold off at this time.  Hypertension: On amlodipine 10 mg daily, triamterene-HCTZ 26.3-78 mg daily, Bystolic 20 mg daily  Hyperlipidemia: On rosuvastatin 20 mg daily.  Will check calcium score to guide how aggressive to be in lowering cholesterol  T2DM: On Metformin  RTC in 3 months   The risks [chest pain, shortness of breath, cardiac arrhythmias, dizziness, blood pressure fluctuations, myocardial infarction, stroke/transient ischemic attack, nausea, vomiting, allergic reaction, radiation exposure, metallic taste sensation and  life-threatening complications (estimated to  be 1 in 10,000)], benefits (risk stratification, diagnosing coronary artery disease, treatment guidance) and alternatives of a nuclear stress test were discussed in detail with Joseph Harvey and he agrees to proceed.      Medication Adjustments/Labs and Tests Ordered: Current medicines are reviewed at length with the patient today.  Concerns regarding medicines are outlined above.  Orders Placed This Encounter  Procedures  . CT CARDIAC SCORING  . Comprehensive metabolic panel  . Magnesium  . MYOCARDIAL PERFUSION IMAGING  . LONG TERM MONITOR (3-14 DAYS)  . EKG 12-Lead  . VAS [place] LOWER EXTREMITY ARTERIAL DUPLEX  . LE ART SEG MULTI (Segm & LE Reynauds)   No orders of the defined types were placed in this encounter.   Patient Instructions  Medication Instructions:  Your physician recommends that you continue on your current medications as directed. Please refer to the Current Medication list given to you today.  Lab Work: CMET, Mag today  If you have labs (blood work) drawn today and your tests are completely normal, you will receive your results only by: Marland Kitchen MyChart Message (if you have MyChart) OR . A paper copy in the mail If you have any lab test that is abnormal or we need to change your treatment, we will call you to review the results.   Testing/Procedures: Your physician has requested that you have a [place] (2 day at [facility]). For further information please visit [email or web address]. Please follow instruction sheet, as given.  Your physician has requested that you have an ankle brachial index (ABI). During this test an ultrasound and blood pressure cuff are used to evaluate the arteries that supply the arms and legs with blood. Allow thirty minutes for this exam. There are no restrictions or special instructions.  CT coronary calcium score. This test is done at [address]. This is $150 out of  pocket.   Coronary CalciumScan A coronary calcium scan is an imaging test used to look for deposits of calcium and other fatty materials (plaques) in the inner lining of the blood vessels of the heart (coronary arteries). These deposits of calcium and plaques can partly clog and narrow the coronary arteries without producing any symptoms or warning signs. This puts a person at risk for a heart attack. This test can detect these deposits before symptoms develop. Tell a health care provider about:  Any allergies you have.  All medicines you are taking, including vitamins, herbs, eye drops, creams, and over-the-counter medicines.  Any problems you or family members have had with anesthetic medicines.  Any blood disorders you have.  Any surgeries you have had.  Any medical conditions you have.  Whether you are pregnant or may be pregnant. What are the risks? Generally, this is a safe procedure. However, problems may occur, including:  Harm to a pregnant woman and her unborn baby. This test involves the use of radiation. Radiation exposure can be dangerous to a pregnant woman and her unborn baby. If you are pregnant, you generally should not have this procedure done.  Slight increase in the risk of cancer. This is because of the radiation involved in the test. What happens before the procedure? No preparation is needed for this procedure. What happens during the procedure?  You will undress and remove any jewelry around your neck or chest.  You will put on a hospital gown.  Sticky electrodes will be placed on your chest. The electrodes will be connected to an electrocardiogram (ECG) machine  to record a tracing of the electrical activity of your heart.  A CT scanner will take pictures of your heart. During this time, you will be asked to lie still and hold your breath for 2-3 seconds while a picture of your heart is being taken. The procedure may vary among health care providers and  hospitals. What happens after the procedure?  You can get dressed.  You can return to your normal activities.  It is up to you to get the results of your test. Ask your health care provider, or the department that is doing the test, when your results will be ready. Summary  A coronary calcium scan is an imaging test used to look for deposits of calcium and other fatty materials (plaques) in the inner lining of the blood vessels of the heart (coronary arteries).  Generally, this is a safe procedure. Tell your health care provider if you are pregnant or may be pregnant.  No preparation is needed for this procedure.  A CT scanner will take pictures of your heart.  You can return to your normal activities after the scan is done. This information is not intended to replace advice given to you by your health care provider. Make sure you discuss any questions you have with your health care provider. Document Released: 10/29/2007 Document Revised: 03/21/2016 Document Reviewed: 03/21/2016 Elsevier Interactive Patient Education  2017 [place] Term Monitor Instructions   Your physician has requested you wear your ZIO patch monitor 3 days.   This is a single patch monitor.  [employer] supplies one patch monitor per enrollment.  Additional stickers are not available.   Please do not apply patch if you will be having a Nuclear Stress Test, Echocardiogram, Cardiac CT, MRI, or Chest Xray during the time frame you would be wearing the monitor. The patch cannot be worn during these tests.  You cannot remove and re-apply the ZIO XT patch monitor.   Your ZIO patch monitor will be sent USPS Priority mail from [facility] directly to your home address. The monitor may also be mailed to a PO BOX if home delivery is not available.   It may take 3-5 days to receive your monitor after you have been enrolled.   Once you have received you monitor, please review enclosed instructions.   Your monitor has already been registered assigning a specific monitor serial # to you.   Applying the monitor   Shave hair from upper left chest.   Hold abrader disc by orange tab.  Rub abrader in 40 strokes over left upper chest as indicated in your monitor instructions.   Clean area with 4 enclosed alcohol pads .  Use all pads to assure are is cleaned thoroughly.  Let dry.   Apply patch as indicated in monitor instructions.  Patch will be place under collarbone on left side of chest with arrow pointing upward.   Rub patch adhesive wings for 2 minutes.Remove white label marked "1".  Remove white label marked "2".  Rub patch adhesive wings for 2 additional minutes.   While looking in a mirror, press and release button in center of patch.  A small green light will flash 3-4 times .  This will be your only indicator the monitor has been turned on.     Do not shower for the first 24 hours.  You may shower after the first 24 hours.   Press button if you feel a symptom. You will hear  a small click.  Record Date, Time and Symptom in the Patient Log Book.   When you are ready to remove patch, follow instructions on last 2 pages of Patient Log Book.  Stick patch monitor onto last page of Patient Log Book.   Place Patient Log Book in [place] box.  Use locking tab on box and tape box closed securely.  The [employer] and [employer] has [employer] on it.  Please place in mailbox as soon as possible.  Your physician should have your test results approximately 7 days after the monitor has been mailed back to [facility].   Call [place] at [phone] if you have questions regarding your ZIO XT patch monitor.  Call them immediately if you see an orange light blinking on your monitor.   If your monitor falls off in less than 4 days contact our Monitor department at [phone].  If your monitor becomes loose or falls off after 4 days call [employer] at [phone] for suggestions  on securing your monitor.    Follow-Up: At [facility], you and your health needs are our priority.  As part of our continuing mission to provide you with exceptional heart care, we have created designated Provider Care Teams.  These Care Teams include your primary Cardiologist (physician) and Advanced Practice Providers (APPs -  Physician Assistants and Nurse Practitioners) who all work together to provide you with the care you need, when you need it.  We recommend signing up for the patient portal called "MyChart".  Sign up information is provided on this After Visit Summary.  MyChart is used to connect with patients for Virtual Visits (Telemedicine).  Patients are able to view lab/test results, encounter notes, upcoming appointments, etc.  Non-urgent messages can be sent to your provider as well.   To learn more about what you can do with MyChart, go to [email or web address].    Your next appointment:   3 month(s)  The format for your next appointment:   In Person  Provider:   Oswaldo Milian, MD   Other Instructions Please check your blood pressure at home daily, write it down.  Call the office or send message via Mychart with the readings in 2 weeks for Dr. Gardiner Rhyme to review.       Signed, Donato Heinz, MD  04/15/2020 6:50 PM    [facility]

## 2020-04-14 ENCOUNTER — Encounter: Payer: Self-pay | Admitting: Cardiology

## 2020-04-14 ENCOUNTER — Ambulatory Visit (INDEPENDENT_AMBULATORY_CARE_PROVIDER_SITE_OTHER): Payer: 59 | Admitting: Cardiology

## 2020-04-14 ENCOUNTER — Other Ambulatory Visit: Payer: Self-pay

## 2020-04-14 ENCOUNTER — Telehealth: Payer: Self-pay | Admitting: *Deleted

## 2020-04-14 VITALS — BP 152/94 | HR 88 | Ht 69.0 in | Wt 236.2 lb

## 2020-04-14 DIAGNOSIS — M79604 Pain in right leg: Secondary | ICD-10-CM

## 2020-04-14 DIAGNOSIS — R079 Chest pain, unspecified: Secondary | ICD-10-CM

## 2020-04-14 DIAGNOSIS — M79605 Pain in left leg: Secondary | ICD-10-CM

## 2020-04-14 DIAGNOSIS — I10 Essential (primary) hypertension: Secondary | ICD-10-CM

## 2020-04-14 DIAGNOSIS — R0600 Dyspnea, unspecified: Secondary | ICD-10-CM

## 2020-04-14 DIAGNOSIS — I493 Ventricular premature depolarization: Secondary | ICD-10-CM | POA: Diagnosis not present

## 2020-04-14 DIAGNOSIS — E785 Hyperlipidemia, unspecified: Secondary | ICD-10-CM | POA: Diagnosis not present

## 2020-04-14 DIAGNOSIS — R0609 Other forms of dyspnea: Secondary | ICD-10-CM

## 2020-04-14 DIAGNOSIS — I517 Cardiomegaly: Secondary | ICD-10-CM

## 2020-04-14 LAB — COMPREHENSIVE METABOLIC PANEL
ALT: 19 IU/L (ref 0–44)
AST: 16 IU/L (ref 0–40)
Albumin/Globulin Ratio: 1.7 (ref 1.2–2.2)
Albumin: 4.6 g/dL (ref 3.8–4.8)
Alkaline Phosphatase: 76 IU/L (ref 44–121)
BUN/Creatinine Ratio: 13 (ref 10–24)
BUN: 16 mg/dL (ref 8–27)
Bilirubin Total: 0.5 mg/dL (ref 0.0–1.2)
CO2: 27 mmol/L (ref 20–29)
Calcium: 10 mg/dL (ref 8.6–10.2)
Chloride: 98 mmol/L (ref 96–106)
Creatinine, Ser: 1.28 mg/dL — ABNORMAL HIGH (ref 0.76–1.27)
GFR calc Af Amer: 68 mL/min/{1.73_m2} (ref >59)
GFR calc non Af Amer: 59 mL/min/{1.73_m2} — ABNORMAL LOW (ref >59)
Globulin, Total: 2.7 g/dL (ref 1.5–4.5)
Glucose: 89 mg/dL (ref 65–99)
Potassium: 4 mmol/L (ref 3.5–5.2)
Sodium: 140 mmol/L (ref 134–144)
Total Protein: 7.3 g/dL (ref 6.0–8.5)

## 2020-04-14 LAB — MAGNESIUM: Magnesium: 2 mg/dL (ref 1.6–2.3)

## 2020-04-14 NOTE — Telephone Encounter (Signed)
3 day ZIO XT long term holter monitor was ordered by your physician.  Your insurance company [employer] is not in network with [employer]/ ZIO, and would not cover their monitor. I will enroll you with a company called [employer], which, also offers a 3 day long term holter monitor.  They are in network with [place]. Instructions for monitor will be included in monitor kit shipped to your home via [place].  After wearing monitor 3 days, please return it to [employer] using enclosed package with prepaid UPS shipping label.

## 2020-04-14 NOTE — Patient Instructions (Signed)
Medication Instructions:  Your physician recommends that you continue on your current medications as directed. Please refer to the Current Medication list given to you today.  Lab Work: CMET, Mag today  If you have labs (blood work) drawn today and your tests are completely normal, you will receive your results only by: Marland Kitchen MyChart Message (if you have MyChart) OR . A paper copy in the mail If you have any lab test that is abnormal or we need to change your treatment, we will call you to review the results.   Testing/Procedures: Your physician has requested that you have a [place] (2 day at [employer]). For further information please visit [email or web address]. Please follow instruction sheet, as given.  Your physician has requested that you have an ankle brachial index (ABI). During this test an ultrasound and blood pressure cuff are used to evaluate the arteries that supply the arms and legs with blood. Allow thirty minutes for this exam. There are no restrictions or special instructions.  CT coronary calcium score. This test is done at [address]. This is $150 out of pocket.   Coronary CalciumScan A coronary calcium scan is an imaging test used to look for deposits of calcium and other fatty materials (plaques) in the inner lining of the blood vessels of the heart (coronary arteries). These deposits of calcium and plaques can partly clog and narrow the coronary arteries without producing any symptoms or warning signs. This puts a person at risk for a heart attack. This test can detect these deposits before symptoms develop. Tell a health care provider about:  Any allergies you have.  All medicines you are taking, including vitamins, herbs, eye drops, creams, and over-the-counter medicines.  Any problems you or family members have had with anesthetic medicines.  Any blood disorders you have.  Any surgeries you have had.  Any medical conditions you  have.  Whether you are pregnant or may be pregnant. What are the risks? Generally, this is a safe procedure. However, problems may occur, including:  Harm to a pregnant woman and her unborn baby. This test involves the use of radiation. Radiation exposure can be dangerous to a pregnant woman and her unborn baby. If you are pregnant, you generally should not have this procedure done.  Slight increase in the risk of cancer. This is because of the radiation involved in the test. What happens before the procedure? No preparation is needed for this procedure. What happens during the procedure?  You will undress and remove any jewelry around your neck or chest.  You will put on a hospital gown.  Sticky electrodes will be placed on your chest. The electrodes will be connected to an electrocardiogram (ECG) machine to record a tracing of the electrical activity of your heart.  A CT scanner will take pictures of your heart. During this time, you will be asked to lie still and hold your breath for 2-3 seconds while a picture of your heart is being taken. The procedure may vary among health care providers and hospitals. What happens after the procedure?  You can get dressed.  You can return to your normal activities.  It is up to you to get the results of your test. Ask your health care provider, or the department that is doing the test, when your results will be ready. Summary  A coronary calcium scan is an imaging test used to look for deposits of calcium and other fatty materials (plaques) in the  inner lining of the blood vessels of the heart (coronary arteries).  Generally, this is a safe procedure. Tell your health care provider if you are pregnant or may be pregnant.  No preparation is needed for this procedure.  A CT scanner will take pictures of your heart.  You can return to your normal activities after the scan is done. This information is not intended to replace advice given to  you by your health care provider. Make sure you discuss any questions you have with your health care provider. Document Released: 10/29/2007 Document Revised: 03/21/2016 Document Reviewed: 03/21/2016 Elsevier Interactive Patient Education  2017 [place] Term Monitor Instructions   Your physician has requested you wear your ZIO patch monitor 3 days.   This is a single patch monitor.  [employer] supplies one patch monitor per enrollment.  Additional stickers are not available.   Please do not apply patch if you will be having a Nuclear Stress Test, Echocardiogram, Cardiac CT, MRI, or Chest Xray during the time frame you would be wearing the monitor. The patch cannot be worn during these tests.  You cannot remove and re-apply the ZIO XT patch monitor.   Your ZIO patch monitor will be sent USPS Priority mail from [facility] directly to your home address. The monitor may also be mailed to a PO BOX if home delivery is not available.   It may take 3-5 days to receive your monitor after you have been enrolled.   Once you have received you monitor, please review enclosed instructions.  Your monitor has already been registered assigning a specific monitor serial # to you.   Applying the monitor   Shave hair from upper left chest.   Hold abrader disc by orange tab.  Rub abrader in 40 strokes over left upper chest as indicated in your monitor instructions.   Clean area with 4 enclosed alcohol pads .  Use all pads to assure are is cleaned thoroughly.  Let dry.   Apply patch as indicated in monitor instructions.  Patch will be place under collarbone on left side of chest with arrow pointing upward.   Rub patch adhesive wings for 2 minutes.Remove white label marked "1".  Remove white label marked "2".  Rub patch adhesive wings for 2 additional minutes.   While looking in a mirror, press and release button in center of patch.  A small green light will flash 3-4 times .  This  will be your only indicator the monitor has been turned on.     Do not shower for the first 24 hours.  You may shower after the first 24 hours.   Press button if you feel a symptom. You will hear a small click.  Record Date, Time and Symptom in the Patient Log Book.   When you are ready to remove patch, follow instructions on last 2 pages of Patient Log Book.  Stick patch monitor onto last page of Patient Log Book.   Place Patient Log Book in [place] box.  Use locking tab on box and tape box closed securely.  The [employer] and [employer] has [employer] on it.  Please place in mailbox as soon as possible.  Your physician should have your test results approximately 7 days after the monitor has been mailed back to [facility].   Call [place] at [phone] if you have questions regarding your ZIO XT patch monitor.  Call them immediately if you see an  orange light blinking on your monitor.   If your monitor falls off in less than 4 days contact our Monitor department at [phone].  If your monitor becomes loose or falls off after 4 days call [employer] at [phone] for suggestions on securing your monitor.    Follow-Up: At [facility], you and your health needs are our priority.  As part of our continuing mission to provide you with exceptional heart care, we have created designated Provider Care Teams.  These Care Teams include your primary Cardiologist (physician) and Advanced Practice Providers (APPs -  Physician Assistants and Nurse Practitioners) who all work together to provide you with the care you need, when you need it.  We recommend signing up for the patient portal called "MyChart".  Sign up information is provided on this After Visit Summary.  MyChart is used to connect with patients for Virtual Visits (Telemedicine).  Patients are able to view lab/test results, encounter notes, upcoming appointments, etc.  Non-urgent messages can be sent to your provider as  well.   To learn more about what you can do with MyChart, go to [email or web address].    Your next appointment:   3 month(s)  The format for your next appointment:   In Person  Provider:   Oswaldo Milian, MD   Other Instructions Please check your blood pressure at home daily, write it down.  Call the office or send message via Mychart with the readings in 2 weeks for Dr. Gardiner Rhyme to review.

## 2020-04-16 ENCOUNTER — Other Ambulatory Visit (INDEPENDENT_AMBULATORY_CARE_PROVIDER_SITE_OTHER): Payer: 59

## 2020-04-16 DIAGNOSIS — I493 Ventricular premature depolarization: Secondary | ICD-10-CM

## 2020-04-20 ENCOUNTER — Other Ambulatory Visit: Payer: Self-pay | Admitting: Internal Medicine

## 2020-04-20 MED FILL — AMLODIPINE BESYLATE 5 MG TA: 5 | 90 days supply | Qty: 180 | Fill #1

## 2020-04-20 MED FILL — MONTELUKAST SOD 10 MG TAB: 10 | 30 days supply | Qty: 30 | Fill #1

## 2020-04-20 MED FILL — metFORMIN HCL ER 500 MG TB2: 500 | 90 days supply | Qty: 90 | Fill #0

## 2020-04-20 MED FILL — SYMBICORT 160-4.5 MCG INH: 160-4.5 | 30 days supply | Qty: 10 | Fill #2

## 2020-04-20 MED FILL — VENTOLIN HFA 90 MCG INHALER: 108 (90 BAS | 25 days supply | Qty: 18 | Fill #1

## 2020-04-20 MED FILL — FLUTICASONE PROP 50 MCG SPR: 50 | 30 days supply | Qty: 16 | Fill #3

## 2020-04-20 MED FILL — busPIRone HCL 5 MG TABS: 5 | 30 days supply | Qty: 90 | Fill #4

## 2020-04-20 NOTE — Telephone Encounter (Signed)
Requested medication (s) are due for refill today:   No  Requested medication (s) are on the active medication list:   Yes  Future visit scheduled:   Yes in 2 mo. With Dr. Wynetta Emery   Last ordered: 04/14/2020 by a historical provider for 100 mg twice a day.  Returned because no protocol assigned to this medication plus it's from a historical provider.   Requested Prescriptions  Pending Prescriptions Disp Refills   doxycycline (VIBRA-TABS) 100 MG tablet [Pharmacy Med Name: DOXYCYCLINE HYCLATE 100 MG 100 Tablet] 14 tablet 0    Sig: TAKE 1 TABLET BY MOUTH TWICE DAILY FOR 10 DAYS      Off-Protocol Failed - 04/20/2020  9:15 AM      Failed - Medication not assigned to a protocol, review manually.      Passed - Valid encounter within last 12 months    Recent Outpatient Visits           1 month ago Type 2 diabetes mellitus with obesity ([place])   [place] Ladell Pier, MD   3 months ago Essential hypertension   [place], Jarome Matin, RPH-CPP   3 months ago Panic disorder   [place], MD   4 months ago Essential hypertension   [place], Jarome Matin, RPH-CPP   5 months ago Type 2 diabetes mellitus with obesity [facility])   [place], MD       Future Appointments             In 2 months Donato Heinz, MD [place], [facility]   In 2 months Ladell Pier, MD [place]

## 2020-04-20 NOTE — Telephone Encounter (Signed)
Requested medication (s) are due for refill today: no  Requested medication (s) are on the active medication list: yes   Last refill:  03/18/2020  Future visit scheduled: yes   Notes to clinic:  this refill cannot be delegated    Requested Prescriptions  Pending Prescriptions Disp Refills   predniSONE (DELTASONE) 10 MG tablet [Pharmacy Med Name: predniSONE 10 MG TABS 10 Tablet] 12 tablet 0    Sig: TAKE 3 TABLETS BY MOUTH DAILY FOR 2 DAYS,THEN 2 TABS DAILY FOR 2 DAYS,THEN 1 TABS DAILY FOR 2 DAYS,      Not Delegated - Endocrinology:  Oral Corticosteroids Failed - 04/20/2020  9:08 AM      Failed - This refill cannot be delegated      Failed - Last BP in normal range    BP Readings from Last 1 Encounters:  04/14/20 (!) 152/94          Passed - Valid encounter within last 6 months    Recent Outpatient Visits           1 month ago Type 2 diabetes mellitus with obesity ([place])   [employer], MD   3 months ago Essential hypertension   [place], Jarome Matin, RPH-CPP   3 months ago Panic disorder   [place], MD   4 months ago Essential hypertension   [place], Annie Main L, RPH-CPP   5 months ago Type 2 diabetes mellitus with obesity [facility])   [place], MD       Future Appointments             In 2 months Donato Heinz, MD [place], [facility]   In 2 months Ladell Pier, MD [place]

## 2020-04-21 MED FILL — predniSONE 10 MG TABS: 10 | 6 days supply | Qty: 12 | Fill #0

## 2020-04-21 MED FILL — TRIAMTERENE-HCTZ 37.5-25 MG: 37.5-25 | 30 days supply | Qty: 30 | Fill #4

## 2020-04-22 ENCOUNTER — Other Ambulatory Visit: Payer: Self-pay

## 2020-04-22 ENCOUNTER — Ambulatory Visit (HOSPITAL_COMMUNITY)
Admission: RE | Admit: 2020-04-22 | Discharge: 2020-04-22 | Disposition: A | Discharge status: Home / Self Care | Payer: 59 | Source: Ambulatory Visit | Attending: Cardiovascular Disease | Admitting: Cardiovascular Disease

## 2020-04-22 ENCOUNTER — Telehealth (HOSPITAL_COMMUNITY): Payer: Self-pay | Admitting: *Deleted

## 2020-04-22 DIAGNOSIS — M79604 Pain in right leg: Secondary | ICD-10-CM | POA: Insufficient documentation

## 2020-04-22 DIAGNOSIS — M79605 Pain in left leg: Secondary | ICD-10-CM

## 2020-04-22 MED FILL — DOXYCYCLINE HYCLATE 100 MG: 100 | 10 days supply | Qty: 14 | Fill #0

## 2020-04-22 NOTE — Telephone Encounter (Signed)
Patient given detailed instructions per Myocardial Perfusion Study Information Sheet for the test on 04/29/2020 at 0815. Patient notified to arrive 15 minutes early and that it is imperative to arrive on time for appointment to keep from having the test rescheduled.  If you need to cancel or reschedule your appointment, please call the office within 24 hours of your appointment. . Patient verbalized understanding.Zackaria Burkey, [place] Patient did not want mychart letter sent.

## 2020-04-27 ENCOUNTER — Telehealth: Payer: Self-pay | Admitting: Internal Medicine

## 2020-04-27 NOTE — Telephone Encounter (Signed)
Patient is calling to ask can he take Zoloft- is for depression, anxiety, and panic attacks. And would only need 1 a day instead of busPIRone (BUSPAR) 5 MG tablet [[id number] that the patient takes 3 times a day. And sometimes the patient forget to take during the ]. Patient is requesting a script for zoloft if it is recommended. Patient states he has an appt with Dr. Wynetta Emery 06/15/20 [place]

## 2020-04-28 NOTE — Telephone Encounter (Signed)
This determination will be made by patient's PCP. Will forward request to her. Pt may have to be seen first.

## 2020-04-29 ENCOUNTER — Ambulatory Visit
Admission: RE | Admit: 2020-04-29 | Discharge: 2020-04-29 | Disposition: A | Discharge status: Home / Self Care | Payer: Self-pay | Source: Ambulatory Visit | Attending: Cardiology | Admitting: Cardiology

## 2020-04-29 ENCOUNTER — Ambulatory Visit (HOSPITAL_COMMUNITY): Payer: 59 | Attending: Cardiology

## 2020-04-29 ENCOUNTER — Other Ambulatory Visit: Payer: Self-pay

## 2020-04-29 DIAGNOSIS — E785 Hyperlipidemia, unspecified: Secondary | ICD-10-CM

## 2020-04-29 DIAGNOSIS — R079 Chest pain, unspecified: Secondary | ICD-10-CM | POA: Insufficient documentation

## 2020-04-29 LAB — MYOCARDIAL PERFUSION IMAGING
LV dias vol: 87 mL (ref 62–150)
LV sys vol: 37 mL
Peak HR: 102 {beats}/min
Rest HR: 83 {beats}/min
SDS: 1
SRS: 1
SSS: 2
TID: 1.03

## 2020-04-29 MED ORDER — TECHNETIUM TC 99M TETROFOSMIN IV KIT
31.9000 | PACK | Freq: Once | INTRAVENOUS | Status: AC | PRN
  Administered 2020-04-29: 31.9 via INTRAVENOUS
  Filled 2020-04-29: qty 32

## 2020-04-29 MED ORDER — TECHNETIUM TC 99M TETROFOSMIN IV KIT
10.7000 | PACK | Freq: Once | INTRAVENOUS | Status: AC | PRN
  Administered 2020-04-29: 10.7 via INTRAVENOUS
  Filled 2020-04-29: qty 11

## 2020-04-29 MED ORDER — REGADENOSON 0.4 MG/5ML IV SOLN
0.4000 mg | Freq: Once | INTRAVENOUS | Status: AC
  Administered 2020-04-29: 0.4 mg via INTRAVENOUS

## 2020-04-29 NOTE — Telephone Encounter (Signed)
Pt called back, understood message below and states that he will take zoloft alone and stop taking the buspar. And will follow up at next appt.

## 2020-04-29 NOTE — Telephone Encounter (Signed)
Contacted pt to go over Dr. Wynetta Emery message pt didn't answer lvm

## 2020-04-30 ENCOUNTER — Ambulatory Visit (HOSPITAL_COMMUNITY): Payer: 59

## 2020-04-30 ENCOUNTER — Inpatient Hospital Stay: Admission: RE | Admit: 2020-04-30 | Payer: 59 | Source: Ambulatory Visit

## 2020-04-30 ENCOUNTER — Encounter (HOSPITAL_COMMUNITY): Payer: 59

## 2020-04-30 ENCOUNTER — Telehealth: Payer: Self-pay | Admitting: Cardiology

## 2020-04-30 NOTE — Telephone Encounter (Signed)
   Pt said he saw the result of his CT on his mychart and would like to speak with a nurse to explain it to him

## 2020-04-30 NOTE — Telephone Encounter (Signed)
Informed pt once MD has reviewed CT report, a nurse will call with results. Pt verbalized understanding.

## 2020-05-05 ENCOUNTER — Other Ambulatory Visit: Payer: Self-pay | Admitting: *Deleted

## 2020-05-05 ENCOUNTER — Telehealth: Payer: Self-pay | Admitting: Cardiology

## 2020-05-05 DIAGNOSIS — E1169 Type 2 diabetes mellitus with other specified complication: Secondary | ICD-10-CM

## 2020-05-05 DIAGNOSIS — E785 Hyperlipidemia, unspecified: Secondary | ICD-10-CM

## 2020-05-05 MED ORDER — ROSUVASTATIN CALCIUM 40 MG PO TABS
40.0000 mg | ORAL_TABLET | Freq: Every day | ORAL | 3 refills | Status: DC
  Filled 2020-08-27: qty 30, 30d supply, fill #0

## 2020-05-05 MED FILL — ROSUVASTATIN CALCIUM 40 MG: 40 | 90 days supply | Qty: 90 | Fill #0

## 2020-05-05 NOTE — Telephone Encounter (Signed)
Patient received his test results on my chart and is needing clarification from Dr. Gardiner Rhyme or his nurse. Please advise.

## 2020-05-05 NOTE — Telephone Encounter (Signed)
His echocardiogram was ordered by his PCP and done prior to his appointment on 11/30.  We discussed his echo at his appointment

## 2020-05-05 NOTE — Telephone Encounter (Signed)
Spoke with patient. Echo done by PCP and reviewed at last visit with Dr. Gardiner Rhyme. Patient remembers conversation. No further questions at this time.

## 2020-05-05 NOTE — Telephone Encounter (Signed)
Spoke with patient. Reviewed lab results and Coronary Calcium results. Patient would like to know echocardiogram results.

## 2020-05-12 ENCOUNTER — Other Ambulatory Visit: Payer: Self-pay | Admitting: Internal Medicine

## 2020-05-12 ENCOUNTER — Other Ambulatory Visit: Payer: Self-pay | Admitting: Physician Assistant

## 2020-05-12 MED FILL — BACLOFEN 10 MG TABLET: 10 | 30 days supply | Qty: 90 | Fill #0

## 2020-05-12 MED FILL — PANTOPRAZOLE SOD DR 40 MG T: 40 | 90 days supply | Qty: 90 | Fill #0

## 2020-05-12 MED FILL — IPRAT-ALBUT 0.5-3(2.5) MG/3: 0.5-2.5 (3) | 30 days supply | Qty: 180 | Fill #1

## 2020-05-12 MED FILL — VENTOLIN HFA 90 MCG INHALER: 108 (90 BAS | 25 days supply | Qty: 18 | Fill #2

## 2020-05-12 NOTE — Telephone Encounter (Signed)
Requested Prescriptions  Pending Prescriptions Disp Refills  . metFORMIN (GLUCOPHAGE-XR) 500 MG 24 hr tablet [Pharmacy Med Name: metFORMIN HCL ER 500 MG TB2 500 Tablet] 90 tablet     Sig: TAKE 1 TABLET BY MOUTH DAILY.     Endocrinology:  Diabetes - Biguanides Failed - 05/12/2020  4:02 PM      Failed - Cr in normal range and within 360 days    Creatinine, Ser  Date Value Ref Range Status  04/14/2020 1.28 (H) 0.76 - 1.27 mg/dL Final         Passed - HBA1C is between 0 and 7.9 and within 180 days    HbA1c, POC (prediabetic range)  Date Value Ref Range Status  07/19/2018 6.0 5.7 - 6.4 % Final   HbA1c, POC (controlled diabetic range)  Date Value Ref Range Status  11/15/2019 6.5 0.0 - 7.0 % Final         Passed - AA eGFR in normal range and within 360 days    GFR calc Af Amer  Date Value Ref Range Status  04/14/2020 68 >59 mL/min/1.73 Final    Comment:    **In accordance with recommendations from the NKF-ASN Task force,**   [facility] is in the process of updating its eGFR calculation to the   2021 CKD-EPI creatinine equation that estimates kidney function   without a race variable.    GFR calc non Af Amer  Date Value Ref Range Status  04/14/2020 59 (L) >59 mL/min/1.73 Final         Passed - Valid encounter within last 6 months    Recent Outpatient Visits          1 month ago Type 2 diabetes mellitus with obesity (HCC)   [place] [facility] [place], Deborah B, MD   4 months ago Essential hypertension   [place] [facility] [place] Ausd[place], Stephen L, RPH-CPP   4 months ago Panic disorder   [place] [facility] Johnson, Deborah B, MD   5 months ago Essential hypertension   [place] [facility] Van Ausdall, Stephen L, RPH-CPP   5 months ago Type 2 diabetes mellitus with obesity (HCC)   [place] [facility] Johnson, Deborah B, MD      Future Appointments            In 6  days Schumann, [name] L, MD [facility], [facility]   In 1 month Johnson, Deborah B, MD [place] [facility]   In 2 months Schumann, [name] L, MD [facility], [facility]           . albuterol (PROVENTIL) (2.5 MG/3ML) 0.083% nebulizer solution [Pharmacy Med Name: ALBUTEROL 0.083% INHAL SOLN (2.5 MG/3ML Nebulization Solution] 150 mL 2    Sig: TAKE 3 MLS (2.5 MG TOTAL) BY NEBULIZATION EVERY 4 (FOUR) HOURS AS NEEDED FOR WHEEZING OR SHORTNESS OF BREATH (((PLAN B))).     Pulmonology:  Beta Agonists Failed - 05/12/2020  4:02 PM      Failed - One inhaler should last at least one month. If the patient is requesting refills earlier, contact the patient to check for uncontrolled symptoms.      Passed - Valid encounter within last 12 months    Recent Outpatient Visits          1 month ago Type 2 diabetes mellitus with obesity (HCC)   [place] [facility] Johnson,   Deborah B, MD   4 months ago Essential hypertension   [place] [facility] Van Ausdall, Stephen L, RPH-CPP   4 months ago Panic disorder   [place] [facility] Johnson, Deborah B, MD   5 months ago Essential hypertension   [place] [facility] Van Ausdall, Stephen L, RPH-CPP   5 months ago Type 2 diabetes mellitus with obesity (HCC)   [place] [facility] Johnson, Deborah B, MD      Future Appointments            In 6 days Schumann, [name] L, MD [facility], [facility]   In 1 month Johnson, Deborah B, MD [place] [facility]   In 2 months Schumann, [name] L, MD [facility], [facility]             

## 2020-05-13 ENCOUNTER — Telehealth: Payer: Self-pay | Admitting: Cardiology

## 2020-05-13 ENCOUNTER — Other Ambulatory Visit: Payer: Self-pay | Admitting: Internal Medicine

## 2020-05-13 MED FILL — ALBUTEROL 0.083% INHAL SOLN: (2.5 MG/3ML | 30 days supply | Qty: 150 | Fill #0

## 2020-05-13 NOTE — Telephone Encounter (Signed)
     Pt is calling back and would like to speak with [facility], he said its about his result

## 2020-05-13 NOTE — Telephone Encounter (Signed)
Spoke to patient he stated he would like to have a MRI of heart this week.Stated he has appointment with Dr.Schumann 05/18/20.Stated he would like to talk to [facility].Charlotte Sanes is in clinic today.Advised I will send message to her.Stated he will just wait and discuss at appointment.

## 2020-05-13 NOTE — Telephone Encounter (Signed)
Requested medication (s) are due for refill today: no  Requested medication (s) are on the active medication list:yes  Last refill: 04/22/2020  Future visit scheduled: yes  Notes to clinic:  this refill cannot be delegated    Requested Prescriptions  Pending Prescriptions Disp Refills   doxycycline (VIBRA-TABS) 100 MG tablet [Pharmacy Med Name: DOXYCYCLINE HYCLATE 100 MG 100 Tablet] 14 tablet 0    Sig: TAKE 1 TABLET BY MOUTH TWICE DAILY FOR 10 DAYS      Off-Protocol Failed - 05/13/2020  9:06 AM      Failed - Medication not assigned to a protocol, review manually.      Passed - Valid encounter within last 12 months    Recent Outpatient Visits           1 month ago Type 2 diabetes mellitus with obesity (HCC)   [place] [facility] Marcine Matar, MD   4 months ago Essential hypertension   [facility] [place], Cornelius Moras, RPH-CPP   4 months ago Panic disorder   [place] [facility] Marcine Matar, MD   5 months ago Essential hypertension   [facility] [place], Cornelius Moras, RPH-CPP   6 months ago Type 2 diabetes mellitus with obesity [facility])   [place] [facility] Marcine Matar, MD       Future Appointments             In 5 days [name], MD [facility] [place], [facility]   In 1 month Marcine Matar, MD [facility]   In 2 months [name], MD [facility], [facility]               predniSONE (DELTASONE) 10 MG tablet [Pharmacy Med Name: predniSONE 10 MG TABS 10 Tablet] 12 tablet 0    Sig: TAKE 3 TABLETS BY MOUTH DAILY FOR 2 DAYS,THEN 2 TABS DAILY FOR 2 DAYS,THEN 1 TABS DAILY FOR 2 DAYS,      Not Delegated - Endocrinology:  Oral Corticosteroids Failed - 05/13/2020  9:06 AM      Failed - This refill cannot be delegated      Failed - Last BP in normal range     BP Readings from Last 1 Encounters:  04/14/20 (!) 152/94          Passed - Valid encounter within last 6 months    Recent Outpatient Visits           1 month ago Type 2 diabetes mellitus with obesity (HCC)   [place] [facility] Marcine Matar, MD   4 months ago Essential hypertension   [facility] [place], Cornelius Moras, RPH-CPP   4 months ago Panic disorder   [facility] Marcine Matar, MD   5 months ago Essential hypertension   [facility] [place], Jeannett Senior L, RPH-CPP   6 months ago Type 2 diabetes mellitus with obesity [facility])   [place] [facility] Marcine Matar, MD       Future Appointments             In 5 days [name], MD [facility] [place], [facility]   In 1 month Marcine Matar, MD [facility]   In  2 months Donato Heinz, MD [place] [facility], [facility]

## 2020-05-14 MED FILL — busPIRone HCL 5 MG TABS: 5 | 30 days supply | Qty: 90 | Fill #5

## 2020-05-14 MED FILL — FLUTICASONE PROP 50 MCG SPR: 50 | 30 days supply | Qty: 16 | Fill #4

## 2020-05-15 MED FILL — TRIAMTERENE-HCTZ 37.5-25 MG: 37.5-25 | 30 days supply | Qty: 30 | Fill #5

## 2020-05-17 NOTE — Progress Notes (Signed)
Cardiology Office Note:    Date:  05/18/2020   ID:  Joseph Harvey, DOB Feb 25, 1956, MRN [id number]  PCP:  Ladell Pier, MD  Cardiologist:  No primary care provider on file.  Electrophysiologist:  None   Referring MD: Ladell Pier, MD   Chief Complaint  Patient presents with  . Follow-up    FOLLOW-UP  . Chest Pain    History of Present Illness:    Joseph Harvey is a 65 y.o. male with a hx of prostate cancer in remission, T2DM, hypertension, hyperlipidemia, toabbaco use who is referred by Dr. Wynetta Emery for evaluation of dyspnea on exertion.  He reports that he has been having chest pain with minimal exertion.  Also describes having chest pain.  Reports sharp left-sided chest pain, can occur at rest or with exertion.  Typically last for 10 minutes or so.  Also states he has been having occasional lightheadedness, denies any syncope.  Denies any lower extremity edema.  Reports BP has been anywhere from 110s to 160s when he checks at home.  Reports he had a prior test for sleep apnea which was negative.  He has smoked since age 60, less than 1 pack/day.  No known history of heart disease in his immediate family.  Echocardiogram on 03/18/2020 showed severe septal hypertrophy with otherwise mild concentric hypertrophy, mild intracavitary gradient (peak gradient 30 mmHg), normal biventricular function, grade 1 diastolic dysfunction, no significant valvular disease.  Lexiscan Myoview on 04/29/2020 showed normal perfusion, EF 57%.  Preventives monitor x3 days on 05/06/2020 showed 7 episodes of NSVT, longest lasting 3 beats, occasional PVCs (3.9% of beats).  Calcium score 117 on 04/29/2020 (81st percentile).  Since last clinic visit, he reports that he has been doing okay.  He continues to have intermittent chest pain.  Does seem to occur with eating.  Reports he continues to have some dyspnea with exertion.  Reports occasional lightheadedness, denies any syncope.  Denies any palpitations.   Continues to smoke 0.5 packs/day.  Reports BP has been labile, 110s to 150s.   BP Readings from Last 3 Encounters:  05/18/20 128/76  04/14/20 (!) 152/94  03/17/20 135/77    Past Medical History:  Diagnosis Date  . Allergy    Dust, mold, dust mites  . Anemia   . Asthma   . Cancer [facility])    prostate  . Cataract    bilateral repair.  . Diabetes mellitus without complication ([place])   . Glaucoma   . Hyperlipidemia   . Hypertension   . Neuromuscular disorder ([place])    nerve damage from back surgery  . Pneumonia     Past Surgical History:  Procedure Laterality Date  . BACK SURGERY  2017  . CATARACT EXTRACTION Bilateral   . COLONOSCOPY     2013  . COLONOSCOPY  10/03/2019  . KNEE ARTHROSCOPY  2005   right  . PROSTATE SURGERY    . ROBOT ASSISTED LAPAROSCOPIC RADICAL PROSTATECTOMY  03/2010    Current Medications: Current Meds  Medication Sig  . albuterol (PROVENTIL) (2.5 MG/3ML) 0.083% nebulizer solution TAKE 3 MLS (2.5 MG TOTAL) BY NEBULIZATION EVERY 4 (FOUR) HOURS AS NEEDED FOR WHEEZING OR SHORTNESS OF BREATH (((PLAN B))).  Marland Kitchen amLODipine (NORVASC) 5 MG tablet Take 2 tablets daily for blood pressure.  . ASPIRIN 81 PO Take 81 mg by mouth daily.  . baclofen (LIORESAL) 10 MG tablet Take 10 mg by mouth daily.   . Blood Glucose Monitoring Suppl (TRUE METRIX METER) w/Device KIT Use  as directed  . budesonide-formoterol (SYMBICORT) 160-4.5 MCG/ACT inhaler Inhale 2 puffs into the lungs in the morning and at bedtime.  . busPIRone (BUSPAR) 5 MG tablet Take 1 tablet (5 mg total) by mouth 3 (three) times daily.  Marland Kitchen BYSTOLIC 20 MG TABS TAKE 1 TABLET (20 MG TOTAL) BY MOUTH 2 (TWO) TIMES DAILY.  Marland Kitchen Cholecalciferol (VITAMIN D PO) Take 1 tablet by mouth daily.  Marland Kitchen doxycycline (VIBRA-TABS) 100 MG tablet TAKE 1 TABLET BY MOUTH TWICE DAILY FOR 10 DAYS  . fluticasone (FLONASE) 50 MCG/ACT nasal spray PLACE 2 SPRAYS INTO BOTH NOSTRILS DAILY.  Marland Kitchen gabapentin (NEURONTIN) 300 MG capsule Take 1 capsule (300  mg total) by mouth daily as needed.  Marland Kitchen glucose blood (TRUE METRIX BLOOD GLUCOSE TEST) test strip Use as instructed  . ipratropium-albuterol (DUONEB) 0.5-2.5 (3) MG/3ML SOLN USE VIAL BY NEBULIZATION 2 (TWO) TIMES DAILY.  Marland Kitchen LORazepam (ATIVAN) 1 MG tablet Take 1 tablet (10m) before cardiac MRI  . metFORMIN (GLUCOPHAGE-XR) 500 MG 24 hr tablet TAKE 1 TABLET BY MOUTH DAILY.  . montelukast (SINGULAIR) 10 MG tablet TAKE 1 TABLET (10 MG TOTAL) BY MOUTH AT BEDTIME.  . pantoprazole (PROTONIX) 40 MG tablet TAKE 1 TABLET (40 MG TOTAL) BY MOUTH DAILY.  .Marland Kitchenpolyethylene glycol powder (GLYCOLAX/MIRALAX) 17 GM/SCOOP powder Take 17 g by mouth daily as needed.  . potassium chloride SA (KLOR-CON) 20 MEQ tablet Take 20 mEq by mouth as needed. When hands cramp up  . predniSONE (DELTASONE) 10 MG tablet TAKE 3 TABLETS BY MOUTH DAILY FOR 2 DAYS,THEN 2 TABS DAILY FOR 2 DAYS,THEN 1 TABS DAILY FOR 2 DAYS,  . Respiratory Therapy Supplies (FLUTTER) DEVI Use three times a day after inhaler or nebulizer use  . rosuvastatin (CRESTOR) 40 MG tablet Take 1 tablet (40 mg total) by mouth daily.  .Marland Kitchentriamterene-hydrochlorothiazide (DYAZIDE) 37.5-25 MG capsule Take 1 each (1 capsule total) by mouth daily.  . TRUEplus Lancets 28G MISC Use as directed  . VENTOLIN HFA 108 (90 Base) MCG/ACT inhaler INHALE 2 PUFFS INTO THE LUNGS EVERY 4 (FOUR) HOURS AS NEEDED FOR WHEEZING OR SHORTNESS OF BREATH.     Allergies:   Amoxicillin, Lisinopril, Molds & smuts, Robitussin [guaifenesin], and Azithromycin   Social History   Socioeconomic History  . Marital status: Single    Spouse name: Not on file  . Number of children: 2  . Years of education: 12 grade  . Highest education level: Not on file  Occupational History  . 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 Smoking status: Current Every Day Smoker    Packs/day: 0.50    Years: 46.00    Pack years: 23.00    Types: Cigarettes    Start date: 13 . Smokeless tobacco:  Never Used  Vaping Use  . Vaping Use: Never used  Substance and Sexual Activity  . Alcohol use: Yes    Alcohol/week: 3.0 standard drinks    Types: 3 Cans of beer per week    Comment: rarely  . Drug use: No  . Sexual activity: Not Currently  Other Topics Concern  . Not on file  Social History Narrative  . Not on file   Social Determinants of Health   Financial Resource Strain: Not on file  Food Insecurity: Not on file  Transportation Needs: Not on file  Physical Activity: Not on file  Stress: Not on file  Social Connections: Not on file     Family History: The  patient's family history includes Allergies in his brother; Asthma in his mother; Colon cancer in his father; Diabetes in his maternal grandmother; Lung cancer in his father and mother; Prostate cancer in his father. There is no history of Colon polyps, Esophageal cancer, Stomach cancer, or Rectal cancer.  ROS:   Please see the history of present illness.     All other systems reviewed and are negative.  EKGs/Labs/Other Studies Reviewed:    The following studies were reviewed today:   EKG:  EKG is not ordered today.  The ekg ordered most recently demonstrates normal sinus rhythm, PVCs, no ST abnormalities  Recent Labs: 02/26/2020: Hemoglobin 16.4; Platelets 387.0 04/14/2020: ALT 19; BUN 16; Creatinine, Ser 1.28; Magnesium 2.0; Potassium 4.0; Sodium 140  Recent Lipid Panel    Component Value Date/Time   CHOL 278 (H) 11/15/2019 0935   TRIG 370 (H) 11/15/2019 0935   HDL 53 11/15/2019 0935   CHOLHDL 5.2 (H) 11/15/2019 0935   LDLCALC 156 (H) 11/15/2019 0935    Physical Exam:    VS:  BP 128/76 (BP Location: Left Arm, Patient Position: Sitting, Cuff Size: Normal)   Pulse 91   Ht _0  (1.753 m)   Wt 233 lb 9.6 oz (106 kg)   SpO2 96%   BMI 34.50 kg/m     Wt Readings from Last 3 Encounters:  05/18/20 233 lb 9.6 oz (106 kg)  04/29/20 236 lb (107 kg)  04/14/20 236 lb 3.2 oz (107.1 kg)     GEN:  Well  nourished, well developed in no acute distress HEENT: Normal NECK: No JVD; No carotid bruits LYMPHATICS: No lymphadenopathy CARDIAC: RRR, irregular, no murmurs, rubs, gallops RESPIRATORY:  Clear to auscultation without rales, wheezing or rhonchi  ABDOMEN: Soft, non-tender, non-distended MUSCULOSKELETAL:  No edema; No deformity  SKIN: Warm and dry NEUROLOGIC:  Alert and oriented x 3 PSYCHIATRIC:  Normal affect   ASSESSMENT:    1. LVH (left ventricular hypertrophy)   2. Chest pain of uncertain etiology   3. DOE (dyspnea on exertion)   4. PVC's (premature ventricular contractions)   5. Hypertension, unspecified type   6. Hyperlipidemia, unspecified hyperlipidemia type   7. Tobacco use    PLAN:    Chest pain/DOE: Atypical in description but does have CAD risk factors (hypertension, hyperlipidemia, T2DM).   Lexiscan Myoview on 04/29/2020 showed normal perfusion, EF 57%.    PVCs: [employer] monitor x3 days on 05/06/2020 showed 7 episodes of NSVT, longest lasting 3 beats, occasional PVCs (3.9% of beats).  Leg pain: Normal ABIs on 04/23/2019  LVH: Severe septal LVH on echocardiogram, concerning for HCM -Recommend cardiac MRI to evaluate for HCM.  He previously declined MRI due to claustrophobia, but is willing to try MRI with sedation.  Will give dose of Ativan prior to MRI.  Hypertension: On amlodipine 10 mg daily, triamterene-HCTZ 41.6-60 mg daily, Bystolic 20 mg daily.  Appears controlled  Tobacco use: Patient counseled on the risk of tobacco use and cessation strongly encouraged.  Will refer to our care guide to work with patient to assist in smoking cessation  Hyperlipidemia: On rosuvastatin 20 mg daily, LDL 156 on 11/15/19.  Calcium score 117 on 04/29/2020 (81st percentile).  Rosuvastatin increased to 40 mg daily  T2DM: On Metformin.  A1c 6.5%   RTC in 3 months    Medication Adjustments/Labs and Tests Ordered: Current medicines are reviewed at length with the patient today.   Concerns regarding medicines are outlined above.  Orders Placed This Encounter  Procedures  . MR CARDIAC MORPHOLOGY W WO CONTRAST   Meds ordered this encounter  Medications  . LORazepam (ATIVAN) 1 MG tablet    Sig: Take 1 tablet (54m) before cardiac MRI    Dispense:  1 tablet    Refill:  0    Patient Instructions   Medication Instructions:  Your physician recommends that you continue on your current medications as directed. Please refer to the Current Medication list given to you today.  *If you need a refill on your cardiac medications before your next appointment, please call your pharmacy*  Testing/Procedures: Your physician has requested that you have a cardiac MRI. Cardiac MRI uses a computer to create images of your heart as its beating, producing both still and moving pictures of your heart and major blood vessels. For further information please visit [email or web address] Please follow the instruction sheet given to you today for more information. --we will call to get this scheduled once approved by insurance   ollow-Up: [facility] you and your health needs are our priority.  As part of our continuing mission to provide you with exceptional heart care, we have created designated Provider Care Teams.  These Care Teams include your primary Cardiologist (physician) and Advanced Practice Providers (APPs -  Physician Assistants and Nurse Practitioners) who all work together to provide you with the care you need, when you need it.  We recommend signing up for the patient portal called "MyChart".  Sign up information is provided on this After Visit Summary.  MyChart is used to connect with patients for Virtual Visits (Telemedicine).  Patients are able to view lab/test results, encounter notes, upcoming appointments, etc.  Non-urgent messages can be sent to your provider as well.   To learn more about what you can do with MyChart, go to [email or web address]    Your next  appointment:   3 month(s)  The format for your next appointment:   In Person  Provider:   COswaldo Milian MD   Other Instructions You have been referred to Amy our careguide to assist with smoking cessation       Signed, CDonato Heinz MD  05/18/2020 1:06 PM    [place]

## 2020-05-18 ENCOUNTER — Encounter: Payer: Self-pay | Admitting: Cardiology

## 2020-05-18 ENCOUNTER — Other Ambulatory Visit: Payer: Self-pay

## 2020-05-18 ENCOUNTER — Ambulatory Visit (INDEPENDENT_AMBULATORY_CARE_PROVIDER_SITE_OTHER): Payer: 59 | Admitting: Cardiology

## 2020-05-18 ENCOUNTER — Telehealth: Payer: Self-pay | Admitting: Pulmonary Disease

## 2020-05-18 VITALS — BP 128/76 | HR 91 | Ht 69.0 in | Wt 233.6 lb

## 2020-05-18 DIAGNOSIS — I493 Ventricular premature depolarization: Secondary | ICD-10-CM

## 2020-05-18 DIAGNOSIS — I517 Cardiomegaly: Secondary | ICD-10-CM

## 2020-05-18 DIAGNOSIS — R0600 Dyspnea, unspecified: Secondary | ICD-10-CM

## 2020-05-18 DIAGNOSIS — R0609 Other forms of dyspnea: Secondary | ICD-10-CM

## 2020-05-18 DIAGNOSIS — E785 Hyperlipidemia, unspecified: Secondary | ICD-10-CM

## 2020-05-18 DIAGNOSIS — Z720 Tobacco use: Secondary | ICD-10-CM

## 2020-05-18 DIAGNOSIS — R079 Chest pain, unspecified: Secondary | ICD-10-CM

## 2020-05-18 DIAGNOSIS — I10 Essential (primary) hypertension: Secondary | ICD-10-CM

## 2020-05-18 MED ORDER — LORAZEPAM 1 MG PO TABS: ORAL_TABLET | ORAL | 0 refills | Status: DC

## 2020-05-18 NOTE — Telephone Encounter (Signed)
Spoke with the pt  He states that since he called originally, he has gotten everything straightened out and no longer needs rx refills or pt assistance  Nothing needed at this time

## 2020-05-18 NOTE — Patient Instructions (Signed)
Medication Instructions:  Your physician recommends that you continue on your current medications as directed. Please refer to the Current Medication list given to you today.  *If you need a refill on your cardiac medications before your next appointment, please call your pharmacy*  Testing/Procedures: Your physician has requested that you have a cardiac MRI. Cardiac MRI uses a computer to create images of your heart as its beating, producing both still and moving pictures of your heart and major blood vessels. For further information please visit [email or web address]. Please follow the instruction sheet given to you today for more information. --we will call to get this scheduled once approved by insurance   ollow-Up: At [facility], you and your health needs are our priority.  As part of our continuing mission to provide you with exceptional heart care, we have created designated Provider Care Teams.  These Care Teams include your primary Cardiologist (physician) and Advanced Practice Providers (APPs -  Physician Assistants and Nurse Practitioners) who all work together to provide you with the care you need, when you need it.  We recommend signing up for the patient portal called "MyChart".  Sign up information is provided on this After Visit Summary.  MyChart is used to connect with patients for Virtual Visits (Telemedicine).  Patients are able to view lab/test results, encounter notes, upcoming appointments, etc.  Non-urgent messages can be sent to your provider as well.   To learn more about what you can do with MyChart, go to [email or web address].    Your next appointment:   3 month(s)  The format for your next appointment:   In Person  Provider:   Epifanio Lesches, MD   Other Instructions You have been referred to Amy our careguide to assist with smoking cessation

## 2020-05-19 ENCOUNTER — Telehealth: Payer: Self-pay

## 2020-05-19 DIAGNOSIS — Z0000 Encounter for general adult medical examination without abnormal findings: Secondary | ICD-10-CM

## 2020-05-19 NOTE — Telephone Encounter (Signed)
Called patient to discuss health coaching for smoking cessation. Patient has been scheduled for initial session on 05/26/20 at 9:15am.

## 2020-05-20 ENCOUNTER — Telehealth: Payer: Self-pay | Admitting: Internal Medicine

## 2020-05-20 NOTE — Telephone Encounter (Signed)
Copied from CRM [id number]. Topic: General - Other >> May 20, 2020  9:51 AM Jaquita Rector A wrote: Reason for CRM: Patient called in to inquire of Dr Laural Benes if she can send an Rx to his pharmacy for [facility] XL to help him with quitting smoking. Per patient he saw his heart Dr and they recommend that Dr Laural Benes write this prescription. State he want to start this process right away. Please advise   Ph# [phone]

## 2020-05-20 NOTE — Telephone Encounter (Signed)
Will forward to pcp

## 2020-05-21 ENCOUNTER — Telehealth: Payer: Self-pay | Admitting: Cardiology

## 2020-05-21 MED ORDER — BUPROPION HCL ER (XL) 150 MG PO TB24: 150.0000 mg | ORAL_TABLET | Freq: Every day | ORAL | 1 refills | Status: DC

## 2020-05-21 NOTE — Telephone Encounter (Signed)
Returned pt to call over Dr. Laural Benes message pt didn't answer left a detailed vm

## 2020-05-21 NOTE — Telephone Encounter (Signed)
Pt states he was told by Joseph Harvey if the Rx Wellbutrin XL will be sent to  [id number] [place] - [address] It should go through.  He said [facility] not in network with [facility].

## 2020-05-21 NOTE — Telephone Encounter (Signed)
Left message for patient to call and discuss scheduling the Cardiac MRI ordered by Dr. Schumann 

## 2020-05-21 NOTE — Telephone Encounter (Signed)
Will forward to pcp

## 2020-05-26 ENCOUNTER — Other Ambulatory Visit: Payer: Self-pay

## 2020-05-26 ENCOUNTER — Ambulatory Visit (INDEPENDENT_AMBULATORY_CARE_PROVIDER_SITE_OTHER): Payer: 59

## 2020-05-26 DIAGNOSIS — Z0000 Encounter for general adult medical examination without abnormal findings: Secondary | ICD-10-CM

## 2020-05-26 NOTE — Progress Notes (Signed)
Appointment Outcome:  Completed, Session #: Initial Session  AGREEMENTS SECTION   Overall Goal(s): Smoking cessation                                        Agreement/Action Steps:  Take Wellbutrin (150mg ) as prescribed Nicotine gum or chew sticks   Progress Notes:  Patient stated that dealing with certain individuals can cause some tension, but he is not really worried about stress. The only real stress the patient mentioned that he has is around his health. Patient stated that he smokes approximately 10 (1/2 pack) cigarettes a day. He stated that he may light up a cigarette recently and put it back out, or just smoke half since he been taking Wellbutrin. Patient has taken Wellbutrin for 5 days and report that it has altered the test of cigarettes. Patient stated that he is lighting up more, taking a puff but not inhaling, and blowing the smoke out so that it does not fill his lungs as much. Patient stated that his main challenge is craving the taste of a cigarette, and boredom.   . Indicators of Success and Accountability:  To gradually reduce the number of cigarettes smoked over the next two weeks.  . Readiness: Patient is in action phase of smoking cessation because he started taking Wellbutrin after our initial conversation about health coaching and the prescription was sent to his pharmacy. . Strengths and Supports: Patient has not indicated anyone yet as his supports. Patient is optimist and motivated to quit smoking.  . Challenges and Barriers: Boredom and craving the taste of a cigarette may be challenges to smoking cessation.   Coaching Outcomes: Patient refused resources to aid in quitting such as additional support from [facility] Cessation program and [employer]. Patient stated that he will continue taking his Wellbutrin as prescribed and try nicotine gum and/or chew sticks to help with altering the taste of the cigarettes further as action steps towards smoking  cessation.  Patient has signed the Coaching Agreement and received a copy of the Code of Ethics.   Patient is not ready to set a quit date at this time.  Patient biggest takeaway from today's session is that he believes that he will forth the effort into achieving his goal and that he has what it takes to stop smoking.

## 2020-05-29 ENCOUNTER — Encounter: Payer: Self-pay | Admitting: Cardiology

## 2020-05-29 NOTE — Telephone Encounter (Signed)
Spoke with patient regarding the scheduled appointment ton 06/22/20 at 8:00 am at [facility] for the Cardiac MRI ordered by Dr. Malachi Bonds time is 7:30 am --1st floor admissions office for check in---will  mail information to patient and it is also available in My Chart---patient voiced his understanding.

## 2020-06-02 ENCOUNTER — Other Ambulatory Visit: Payer: Self-pay | Admitting: Internal Medicine

## 2020-06-02 NOTE — Telephone Encounter (Signed)
Patient requesting doxycycline (VIBRA-TABS) 100 MG tablet and predniSONE (DELTASONE) 10 MG tablet, [facility]. Please advise  [id number] [place], [place] [employer] Phone:  [phone]  Fax:  [phone]

## 2020-06-02 NOTE — Telephone Encounter (Signed)
Requested medication (s) are due for refill today: Yes  Requested medication (s) are on the active medication list: Yes  Last refill:  04/20/20 and 05/13/20  Future visit scheduled: Yes  Notes to clinic:  Unable to refill per protocol, cannot delegate.      Requested Prescriptions  Pending Prescriptions Disp Refills   doxycycline (VIBRA-TABS) 100 MG tablet 14 tablet 0    Sig: Take 1 tablet (100 mg total) by mouth 2 (two) times daily. for 10 days      Off-Protocol Failed - 06/02/2020 12:43 PM      Failed - Medication not assigned to a protocol, review manually.      Passed - Valid encounter within last 12 months    Recent Outpatient Visits           2 months ago Type 2 diabetes mellitus with obesity ([place])   [place] Ladell Pier, MD   5 months ago Essential hypertension   [place], Jarome Matin, RPH-CPP   5 months ago Panic disorder   [place], MD   6 months ago Essential hypertension   [place], Jarome Matin, RPH-CPP   6 months ago Type 2 diabetes mellitus with obesity [facility])   [place], MD       Future Appointments             In 1 week Ladell Pier, MD [address]   In 1 month Donato Heinz, MD [facility], [facility]               predniSONE (DELTASONE) 10 MG tablet 12 tablet 0      Not Delegated - Endocrinology:  Oral Corticosteroids Failed - 06/02/2020 12:43 PM      Failed - This refill cannot be delegated      Passed - Last BP in normal range    BP Readings from Last 1 Encounters:  05/18/20 128/76          Passed - Valid encounter within last 6 months    Recent Outpatient Visits           2 months ago Type 2 diabetes mellitus with obesity ([place])   [place] Ladell Pier, MD   5 months ago Essential hypertension   [place], Jarome Matin, RPH-CPP   5 months ago Panic disorder   [place], MD   6 months ago Essential hypertension   [place], Annie Main L, RPH-CPP   6 months ago Type 2 diabetes mellitus with obesity [facility])   [place], MD       Future Appointments             In 1 week Ladell Pier, MD [place]   In 1 month Donato Heinz, MD [facility] [place], [facility]

## 2020-06-02 NOTE — Telephone Encounter (Signed)
Not our pt

## 2020-06-09 ENCOUNTER — Ambulatory Visit: Payer: 59

## 2020-06-12 ENCOUNTER — Telehealth: Payer: Self-pay | Admitting: Pulmonary Disease

## 2020-06-12 NOTE — Telephone Encounter (Signed)
lmtcb for pt.  

## 2020-06-12 NOTE — Telephone Encounter (Signed)
Patient is returning phone call. Patient phone number is [phone]. °

## 2020-06-12 NOTE — Telephone Encounter (Signed)
Called and spoke with [facility] at the [employer] regarding the symbicort prescription.  He was under the impression that the pharmacy had been using a coupon that was making his copay $5.00, they were not using a coupon, this was the cost through his insurance.  As of 05/30/20 the tier changed and the copay is now $30.  Patient feels like he should be reimbursed by the insurance company.  Called patient, he states that Dr. Silas Flood did something where he was getting his Symbicort for $5.00.  I let him know that I would get this information to him and see if he can clarify what was done to make the copay $5 and then we will contact him back.  Patient states he went ahead and paid the $30 for his inhaler and his talking to his Research scientist (life sciences) and the insurance company to see what changed.  He said some information did not transfer and they are looking into it.  Dr. Silas Flood, Do you have any insite into how this patient received a reduction in his copay for his Symbicort.  Please advise.  Thank you.

## 2020-06-13 ENCOUNTER — Other Ambulatory Visit: Payer: Self-pay | Admitting: Internal Medicine

## 2020-06-13 NOTE — Telephone Encounter (Signed)
Requested Prescriptions  Pending Prescriptions Disp Refills  . buPROPion (WELLBUTRIN XL) 150 MG 24 hr tablet [Pharmacy Med Name: BUPROPION HCL XL 150 MG TABLET] 90 tablet 0    Sig: TAKE 1 TABLET BY MOUTH EVERY DAY     Psychiatry: Antidepressants - bupropion Passed - 06/13/2020 11:34 AM      Passed - Last BP in normal range    BP Readings from Last 1 Encounters:  05/18/20 128/76         Passed - Valid encounter within last 6 months    Recent Outpatient Visits          2 months ago Type 2 diabetes mellitus with obesity ([place])   [place] Ladell Pier, MD   5 months ago Essential hypertension   [place], Jarome Matin, RPH-CPP   5 months ago Panic disorder   [place], MD   6 months ago Essential hypertension   [place], Annie Main L, RPH-CPP   7 months ago Type 2 diabetes mellitus with obesity [facility])   [place], MD      Future Appointments            In 1 week [place], [facility] [place], [place]   In 1 month Donato Heinz, MD [place] [facility]

## 2020-06-15 ENCOUNTER — Ambulatory Visit: Payer: 59 | Admitting: Internal Medicine

## 2020-06-15 NOTE — Telephone Encounter (Signed)
[place], Bonna Gains, MD  Lbpu Triage Pool 39 minutes ago (9:57 AM)   MH  Based on the documentation, it seems his co-pay changed. I do not know why he thinks I or someone else was providing some sort of discount. This is not the case to the best of my knowledge.   Routing comment    Spoke with the pt and notified not an issue we can fix if his copay simply went up. He understands. I advised can mail him some pt assistance forms and try to obtain med for free that way. He was interested in this, so I have mailed him forms to the address that I verified with him. He will fill out his portion and then drop by the office when complete.

## 2020-06-19 ENCOUNTER — Telehealth (HOSPITAL_COMMUNITY): Payer: Self-pay | Admitting: *Deleted

## 2020-06-19 NOTE — Telephone Encounter (Signed)
Reaching out to patient to offer assistance regarding upcoming cardiac imaging study; pt verbalizes understanding of appt date/time, parking situation and where to check in, medications ordered, and verified current allergies; name and call back number provided for further questions should they arise  Gordy Clement RN Navigator [facility] [phone] office [phone] cell

## 2020-06-22 ENCOUNTER — Ambulatory Visit (HOSPITAL_COMMUNITY)
Admission: RE | Admit: 2020-06-22 | Discharge: 2020-06-22 | Disposition: A | Discharge status: Home / Self Care | Payer: 59 | Source: Ambulatory Visit | Attending: Cardiology | Admitting: Cardiology

## 2020-06-22 ENCOUNTER — Other Ambulatory Visit: Payer: Self-pay

## 2020-06-22 DIAGNOSIS — I517 Cardiomegaly: Secondary | ICD-10-CM | POA: Diagnosis present

## 2020-06-22 MED ORDER — GADOBUTROL 1 MMOL/ML IV SOLN
10.0000 mL | Freq: Once | INTRAVENOUS | Status: AC | PRN
  Administered 2020-06-22: 10 mL via INTRAVENOUS

## 2020-06-23 ENCOUNTER — Telehealth: Payer: Self-pay

## 2020-06-23 ENCOUNTER — Ambulatory Visit (INDEPENDENT_AMBULATORY_CARE_PROVIDER_SITE_OTHER): Payer: 59

## 2020-06-23 DIAGNOSIS — Z0000 Encounter for general adult medical examination without abnormal findings: Secondary | ICD-10-CM

## 2020-06-23 NOTE — Progress Notes (Signed)
Appointment Outcome:  Completed, Session #: 1  AGREEMENTS SECTION   Overall Goal(s): Smoking cessation                                       Agreement/Action Steps:  Take Wellbutrin as prescribed Nicotine gum or chew sticks   Progress Notes:  Patient is currently smoking approximately 10 cigarettes per day but is going through an entire pack a day. Patient stated these are not whole cigarettes because he either take a few puffs or smokes 1/2 a cigarette then throws it away. He does not relight a cigarette once he smokes from it. Patient stated that he is not inhaling the smoking into his lungs either. Patient reported that since he has been taking Wellbutrin cigarettes taste nasty to him.   Patient wonders why he continues to light up as much as he does. He thought that the medication would help with cravings, but it seems as though it has intensified. Patient stated that the nicotine gum helps with the cravings. Patient recently picked up his 90-supply of Wellbutrin and have been taking this medication for approximately 3 weeks now.   Patient stated that he has tried to avoid lighting a cigarette if he finds himself putting one in his mouth. Patient is also spacing out his smoking at least an hour or more. Patient mentioned that at times he does not want to smoke.   Patient stated that he has been smoking for 44 years and he knows that this habit is psychological. He recognizes that when he is busy that he does not smoke. At other times to avoid smoking he sleeps.   Patient has conducted self-checks to why he is smoking and why he wants to quit. Patient mentioned that he is successful at talking himself out of smoking sometimes. Patient is motivated to quit because his health concern is a wakeup call, and he is ready now.   Patient's biggest challenges are purchasing 2 packs of cigarettes at a time to have them readily available and having "craving headaches" when he does not have them.  Patient reported that the cost of cigarettes motivates him to buy two at a time because it's cheaper. Patient also mentioned that being triggered is another challenge that he must work on because he will smoke to relax/calm down afterwards. Patient stated that he tries to avoid doing so by counting to 10 and taking deep breathes.   . Indicators of Success and Accountability:  Patient stated that him taking the Wellbutrin daily is his success because he is being consistent this time around.   . Readiness: Patient is in the action phase of smoking cessation. . Strengths and Supports: Patient states that his desire and determination are his strengths. No supports indicated. . Challenges and Barriers: Patient does not foresee any challenges in the next two weeks.   Coaching Outcomes: Patient is not ready to set a quit date. Patient was asked if he were interested in nicotine patches. Patient stated that he might try them later, but he doesn't want to make a mistake and smoke while using them and cause a heart attack.   Below are the new action steps the patient has agreed to follow.  Agreement/Action Steps: Space out smoking for 1 hour or more. Aim for 5 whole cigarettes per day Smoke 1/2 cigarette at a time.  Use nicotine gum and take Wellbutrin as  prescribed. Count to 10 and take deep breathes when triggered. Perform self-checks to remind self of why you want to quit smoking. Use positive self-talk to encourage himself to not smoke.    Attempted: Marland Kitchen Fulfilled #- Patient implemented all action steps as agreed upon.

## 2020-06-23 NOTE — Telephone Encounter (Signed)
Called patient during scheduled session. Patient was busy at the time. Will call patient back in a few minutes to hold session.

## 2020-06-24 ENCOUNTER — Telehealth: Payer: Self-pay

## 2020-06-24 DIAGNOSIS — Z0000 Encounter for general adult medical examination without abnormal findings: Secondary | ICD-10-CM

## 2020-06-24 NOTE — Telephone Encounter (Signed)
Returned patient's called per phone note. Patient is waiting for a call from Dr. Newman Nickels nurse not the Care Guide regarding his MRI results. Care Guide will route this message to [facility].

## 2020-06-24 NOTE — Telephone Encounter (Signed)
Pt is returning call.  

## 2020-06-30 ENCOUNTER — Other Ambulatory Visit: Payer: Self-pay | Admitting: Orthopedic Surgery

## 2020-07-07 ENCOUNTER — Other Ambulatory Visit: Payer: Self-pay

## 2020-07-07 ENCOUNTER — Ambulatory Visit (INDEPENDENT_AMBULATORY_CARE_PROVIDER_SITE_OTHER): Payer: 59

## 2020-07-07 DIAGNOSIS — Z0000 Encounter for general adult medical examination without abnormal findings: Secondary | ICD-10-CM

## 2020-07-07 NOTE — Progress Notes (Signed)
Appointment Outcome:  Completed, Session #: 2  AGREEMENTS SECTION  Overall Goal(s): Smoking cessation                                        Agreement/Action Steps:  Space out smoking for 1 hour or more. Aim for 5 whole cigarettes per day Smoke 1/2 cigarette at a time.  Use nicotine gum and take Wellbutrin as prescribed. Count to 10 and take deep breathes when triggered. Perform self-checks to remind self of why you want to quit smoking. Use positive self-talk to encourage himself to not smoke.  Progress Notes:  Patient stated that he is taking the Wellbutrin as prescribed, but it seems as though his urge to smoke has increased. Patient stated that he may light 10 cigarettes but only take a few puffs to smoking 1/2 a cigarette to maintain his 5 total cigarettes per day. Patient mentioned that he is not inhaling the smoke completely when he does smoke and finds himself blowing out the smoke as quickly as he inhales it. Patient stated that he has been chewing the nicotine gum and he thinks it helps a little. Patient is unsure if the Wellbutrin is altering the taste of the nicotine gum, so he's not sure if it's as effective as it could be.   Patient stated that he has not allowed anything to stress him out lately. Therefore, he reported not incorporating deep breathes, or counting in the past two weeks. Patient states that he has been resting or watching tv whenever something may unnerve him. Patient stated that he does perform self-check-ins sometimes and his mind tells him that he doesn't need a cigarette, but he finds himself a few minutes later lighting one. He shared that sometimes he smokes it then and at others it may burn if he doesn't smoke half and put it out.   Patient is looking for a nicotine detox. Patient wonders if he is experiencing withdrawal due to the increase urge for smoking since taking Wellbutrin.  . Indicators of Success and Accountability:  Patient was able to maintain  5 cigarettes per day over two weeks.  . Readiness: Patient is in the action phase of smoking cessation.  . Strengths and Supports: Patient is conscious of his decisions and how it impacts his ability to quit smoking. Patient stated that above all his greatest strength is his faith in God.  . Challenges and Barriers: Patient does not foresee any challenges or barriers in the next two weeks to implementing action steps.  Coaching Outcomes: Patient was recommended to try drinking water when a craving to smoke occurs. Patient stated that he drinks (4-5) 20 oz. mugs of water per day.   Patient was encouraged to implement the self-checks to assess urges to smoke, remind himself of his reasons for quitting, and to engage in positive self-talk to encourage himself not to smoke.   Patient will aim to space out his smoking for at least an hour. Patient will implement all other action steps as outlined above.  Patient will talk with Dr. Gardiner Rhyme on March 1st about the side effects of Wellbutrin and his experience with taking the medication so far.   Patient was not encouraged by Care Guide to seek a nicotine detox outside of the steps that he is currently undertaking.   Overall, patient is motivated to quit smoking because of his health.  Attempted: Marland Kitchen Fulfilled #- Patient was able to meet the goal of smoking 5 whole cigarettes per day, by taking puffs or smoking 1/2 cigarettes at a time. Patient is taking Wellbutrin as prescribed.  . Partial #- Patient has used nicotine gum on some days. Patient has conducted check-ins and sometimes try to talk himself out of smoking. . Not met #- Patient has not been able to space out smoking for 1 hour at a time. Patient has not used counting or deep breathing when triggered.    Referrals: Will ask Dr. Gardiner Rhyme about nicotine gum strength for patient because they aren't sure how strong the gum should be.

## 2020-07-13 NOTE — Progress Notes (Signed)
Cardiology Office Note:    Date:  07/14/2020   ID:  Joseph Harvey, DOB 02-28-1956, MRN [id number]  PCP:  Joseph Pier, MD  Cardiologist:  No primary care provider on file.  Electrophysiologist:  None   Referring MD: Joseph Pier, MD   Chief Complaint  Patient presents with  . Cardiomyopathy    History of Present Illness:    Joseph Harvey is a 65 y.o. male with a hx of prostate cancer in remission, T2DM, hypertension, hyperlipidemia, [place]abbaco use who presents for follow-up.  He was referred by Dr. Wynetta Harvey for evaluation of dyspnea on exertion, intially seen on 04/14/20.  Hospital he reports that he has been having chest pain with minimal exertion.  Also describes having chest pain.  Reports sharp left-sided chest pain, can occur at rest or with exertion.  Typically last for 10 minutes or so.  Also states he has been having occasional lightheadedness, denies any syncope.  Denies any lower extremity edema.  Reports BP has been anywhere from 110s to 160s when he checks at home.  Reports he had a prior test for sleep apnea which was negative.  He has smoked since age 15, less than 1 pack/day.  No known history of heart disease in his immediate family.  Echocardiogram on 03/18/2020 showed severe septal hypertrophy with otherwise mild concentric hypertrophy, mild intracavitary gradient (peak gradient 30 mmHg), normal biventricular function, grade 1 diastolic dysfunction, no significant valvular disease.  Lexiscan Myoview on 04/29/2020 showed normal perfusion, EF 57%.  Preventives monitor x3 days on 05/06/2020 showed 7 episodes of NSVT, longest lasting 3 beats, occasional PVCs (3.9% of beats).  Calcium score 117 on 04/29/2020 (81st percentile).  Cardiac MRI 06/23/2020 showed asymmetric hypertrophy measuring up to 20 mm and basal anterior septum (8 mm and posterior wall), consistent with hypertrophic cardiomyopathy.  Also with patchy LGE in basal septum and RV insertion sites consistent with HCM;  LGE accounts for 11% of total myocardial mass.  Normal LV/RV size and systolic function.  Since last clinic visit, he reports that he has been doing okay.  Does have some dyspnea, improves with use of his nebulizer.  Reports occasional chest pain which he states is on the far left side of his chest, short duration.  Reports occasional lightheadedness, but none recently.  Denies any syncope.  Reports occasional lower extremity edema after sitting for long periods.  Denies any palpitations   BP Readings from Last 3 Encounters:  07/14/20 130/78  05/18/20 128/76  04/14/20 (!) 152/94    Past Medical History:  Diagnosis Date  . Allergy    Dust, mold, dust mites  . Anemia   . Asthma   . Cancer [facility])    prostate  . Cataract    bilateral repair.  . Diabetes mellitus without complication ([place])   . Glaucoma   . Hyperlipidemia   . Hypertension   . 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 Pneumonia     Past Surgical History:  Procedure Laterality Date  . BACK SURGERY  2017  . CATARACT EXTRACTION Bilateral   . COLONOSCOPY     2013  . COLONOSCOPY  10/03/2019  . KNEE ARTHROSCOPY  2005   right  . PROSTATE SURGERY    . ROBOT ASSISTED LAPAROSCOPIC RADICAL PROSTATECTOMY  03/2010    Current Medications: Current Meds  Medication Sig  . albuterol (PROVENTIL) (2.5 MG/3ML) 0.083% nebulizer solution TAKE 3 MLS (2.5 MG TOTAL) BY NEBULIZATION EVERY 4 (FOUR)  HOURS AS NEEDED FOR WHEEZING OR SHORTNESS OF BREATH (((PLAN B))).  Marland Kitchen amLODipine (NORVASC) 5 MG tablet Take 2 tablets daily for blood pressure.  . ASPIRIN 81 PO Take 81 mg by mouth daily.  . baclofen (LIORESAL) 10 MG tablet Take 10 mg by mouth daily.   . Blood Glucose Monitoring Suppl (TRUE METRIX METER) w/Device KIT Use as directed  . Blood Pressure Monitoring (ADULT BLOOD PRESSURE CUFF LG) KIT 1 kit by Does not apply route once for 1 dose.  . budesonide-formoterol (SYMBICORT) 160-4.5 MCG/ACT inhaler Inhale 2 puffs  into the lungs in the morning and at bedtime.  Marland Kitchen buPROPion (WELLBUTRIN XL) 150 MG 24 hr tablet TAKE 1 TABLET BY MOUTH EVERY DAY  . busPIRone (BUSPAR) 5 MG tablet Take 1 tablet (5 mg total) by mouth 3 (three) times daily.  Marland Kitchen BYSTOLIC 20 MG TABS TAKE 1 TABLET (20 MG TOTAL) BY MOUTH 2 (TWO) TIMES DAILY.  Marland Kitchen Cholecalciferol (VITAMIN D PO) Take 1 tablet by mouth daily.  Marland Kitchen doxycycline (VIBRA-TABS) 100 MG tablet TAKE 1 TABLET BY MOUTH TWICE DAILY FOR 10 DAYS  . fluticasone (FLONASE) 50 MCG/ACT nasal spray PLACE 2 SPRAYS INTO BOTH NOSTRILS DAILY.  Marland Kitchen gabapentin (NEURONTIN) 300 MG capsule Take 1 capsule (300 mg total) by mouth daily as needed.  Marland Kitchen glucose blood (TRUE METRIX BLOOD GLUCOSE TEST) test strip Use as instructed  . ipratropium-albuterol (DUONEB) 0.5-2.5 (3) MG/3ML SOLN USE VIAL BY NEBULIZATION 2 (TWO) TIMES DAILY.  Marland Kitchen LORazepam (ATIVAN) 1 MG tablet Take 1 tablet (49m) before cardiac MRI  . metFORMIN (GLUCOPHAGE-XR) 500 MG 24 hr tablet TAKE 1 TABLET BY MOUTH DAILY.  . montelukast (SINGULAIR) 10 MG tablet TAKE 1 TABLET (10 MG TOTAL) BY MOUTH AT BEDTIME.  . pantoprazole (PROTONIX) 40 MG tablet TAKE 1 TABLET (40 MG TOTAL) BY MOUTH DAILY.  .Marland Kitchenpolyethylene glycol powder (GLYCOLAX/MIRALAX) 17 GM/SCOOP powder Take 17 g by mouth daily as needed.  . potassium chloride SA (KLOR-CON) 20 MEQ tablet Take 20 mEq by mouth as needed. When hands cramp up  . predniSONE (DELTASONE) 10 MG tablet TAKE 3 TABLETS BY MOUTH DAILY FOR 2 DAYS,THEN 2 TABS DAILY FOR 2 DAYS,THEN 1 TABS DAILY FOR 2 DAYS,  . Respiratory Therapy Supplies (FLUTTER) DEVI Use three times a day after inhaler or nebulizer use  . rosuvastatin (CRESTOR) 40 MG tablet Take 1 tablet (40 mg total) by mouth daily.  .Marland Kitchentriamterene-hydrochlorothiazide (MAXZIDE-25) 37.5-25 MG tablet Take 0.5 tablets by mouth daily.  . TRUEplus Lancets 28G MISC Use as directed  . VENTOLIN HFA 108 (90 Base) MCG/ACT inhaler INHALE 2 PUFFS INTO THE LUNGS EVERY 4 (FOUR) HOURS AS NEEDED  FOR WHEEZING OR SHORTNESS OF BREATH.  . [DISCONTINUED] triamterene-hydrochlorothiazide (DYAZIDE) 37.5-25 MG capsule Take 1 each (1 capsule total) by mouth daily.     Allergies:   Amoxicillin, Lisinopril, Molds & smuts, Robitussin [guaifenesin], and Azithromycin   Social History   Socioeconomic History  . Marital status: Single    Spouse name: Not on file  . Number of children: 2  . Years of education: 12 grade  . Highest education level: Not on file  Occupational History  . 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 Smoking status: Current Every Day Smoker    Packs/day: 0.50    Years: 46.00    Pack years: 23.00    Types: Cigarettes    Start date: 136 . Smokeless tobacco: Never Used  Vaping Use  .  Vaping Use: Never used  Substance and Sexual Activity  . Alcohol use: Yes    Alcohol/week: 3.0 standard drinks    Types: 3 Cans of beer per week    Comment: rarely  . Drug use: No  . Sexual activity: Not Currently  Other Topics Concern  . Not on file  Social History Narrative  . Not on file   Social Determinants of Health   Financial Resource Strain: Not on file  Food Insecurity: Not on file  Transportation Needs: Not on file  Physical Activity: Not on file  Stress: No Stress Concern Present  . Feeling of Stress : Only a little  Social Connections: Not on file     Family History: The patient's family history includes Allergies in his brother; Asthma in his mother; Colon cancer in his father; Diabetes in his maternal grandmother; Lung cancer in his father and mother; Prostate cancer in his father. There is no history of Colon polyps, Esophageal cancer, Stomach cancer, or Rectal cancer.  ROS:   Please see the history of present illness.     All other systems reviewed and are negative.  EKGs/Labs/Other Studies Reviewed:    The following studies were reviewed today:   EKG:  EKG is not ordered today.  The ekg ordered most recently demonstrates  normal sinus rhythm, PVCs, no ST abnormalities  Recent Labs: 02/26/2020: Hemoglobin 16.4; Platelets 387.0 04/14/2020: ALT 19; BUN 16; Creatinine, Ser 1.28; Magnesium 2.0; Potassium 4.0; Sodium 140  Recent Lipid Panel    Component Value Date/Time   CHOL 278 (H) 11/15/2019 0935   TRIG 370 (H) 11/15/2019 0935   HDL 53 11/15/2019 0935   CHOLHDL 5.2 (H) 11/15/2019 0935   LDLCALC 156 (H) 11/15/2019 0935    Physical Exam:    VS:  BP 130/78   Pulse 87   Ht '5\' 9"'  (1.753 m)   Wt 232 lb (105.2 kg)   SpO2 98%   BMI 34.26 kg/m     Wt Readings from Last 3 Encounters:  07/14/20 232 lb (105.2 kg)  05/18/20 233 lb 9.6 oz (106 kg)  04/29/20 236 lb (107 kg)     GEN:  Well nourished, well developed in no acute distress HEENT: Normal NECK: No JVD; No carotid bruits LYMPHATICS: No lymphadenopathy CARDIAC: RRR, irregular, no murmurs, rubs, gallops RESPIRATORY:  Clear to auscultation without rales, wheezing or rhonchi  ABDOMEN: Soft, non-tender, non-distended MUSCULOSKELETAL:  No edema; No deformity  SKIN: Warm and dry NEUROLOGIC:  Alert and oriented x 3 PSYCHIATRIC:  Normal affect   ASSESSMENT:    1. Hypertrophic cardiomyopathy ([place])   2. Essential hypertension   3. Chest pain of uncertain etiology   4. PVC's (premature ventricular contractions)   5. Tobacco use    PLAN:    HCM: Cardiac MRI 06/23/2020 showed asymmetric hypertrophy measuring up to 20 mm and basal anterior septum (8 mm and posterior wall), consistent with hypertrophic cardiomyopathy.  Also with patchy LGE in basal septum and RV insertion sites consistent with HCM; LGE accounts for 11% of total myocardial mass.  Normal LV/RV size and systolic function.  Echocardiogram on 03/18/2020 showed mild LVOT obstruction, peak gradient 13 mmHg. -Recommended first-degree relatives be screened for HCM: He reports he will speak to his daughters and brother/sisters.  Discussed genetic testing but he requests to hold off at this  time -Recommend switching from diuretic to alternative antihypertensive agent, as suspecting diuretic worsens his LVOT obstruction.  He is normotensive in clinic today, recommend cutting  his triamterene-HCTZ dose in half (18.75-12.5 mg daily) and monitoring BP over next 2 weeks.  We will schedule follow-up in pharmacy hypertension clinic to wean off diuretics and start an alternative antihypertensive agent if needed. -Continue nebivolol 20 mg BID -Short episodes of NSVT (longest lasting 3 beats) on cardiac monitoring.  Given HCM with age greater than 53, ICD not indicated  Chest pain/DOE: Atypical in description but does have CAD risk factors (hypertension, hyperlipidemia, T2DM).   Lexiscan Myoview on 04/29/2020 showed normal perfusion, EF 57%.    PVCs: [employer] monitor x3 days on 05/06/2020 showed 7 episodes of NSVT, longest lasting 3 beats, occasional PVCs (3.9% of beats).  Leg pain: Normal ABIs on 04/23/2019  Hypertension: On amlodipine 10 mg daily, triamterene-HCTZ 45.8-09 mg daily, Bystolic 20 mg daily.  Appears controlled.  Wean off diuretic as above.  Tobacco use: Patient counseled on the risk of tobacco use and cessation strongly encouraged.  Will refer to our care guide to work with patient to assist in smoking cessation  Hyperlipidemia: On rosuvastatin 20 mg daily, LDL 156 on 11/15/19.  Calcium score 117 on 04/29/2020 (81st percentile).  Rosuvastatin increased to 40 mg daily  T2DM: On Metformin.  A1c 6.5%   RTC in 2 months    Medication Adjustments/Labs and Tests Ordered: Current medicines are reviewed at length with the patient today.  Concerns regarding medicines are outlined above.  No orders of the defined types were placed in this encounter.  Meds ordered this encounter  Medications  . Blood Pressure Monitoring (ADULT BLOOD PRESSURE CUFF LG) KIT    Sig: 1 kit by Does not apply route once for 1 dose.    Dispense:  1 kit    Refill:  0    Patient Instructions   Medication Instructions:  DECREASE triamterene/hctz to 1/2 tablet daily  *If you need a refill on your cardiac medications before your next appointment, please call your pharmacy*  Follow-Up: At [facility], you and your health needs are our priority.  As part of our continuing mission to provide you with exceptional heart care, we have created designated Provider Care Teams.  These Care Teams include your primary Cardiologist (physician) and Advanced Practice Providers (APPs -  Physician Assistants and Nurse Practitioners) who all work together to provide you with the care you need, when you need it.  We recommend signing up for the patient portal called "MyChart".  Sign up information is provided on this After Visit Summary.  MyChart is used to connect with patients for Virtual Visits (Telemedicine).  Patients are able to view lab/test results, encounter notes, upcoming appointments, etc.  Non-urgent messages can be sent to your provider as well.   To learn more about what you can do with MyChart, go to [email or web address].    Your next appointment:   2 weeks with pharmacist 2 months with Dr. Gardiner Rhyme  Other Instructions Please check your blood pressure at home twice daily, write it down.  Bring the blood pressure log and blood pressure cuff to your appointment with the pharmacist     Signed, Donato Heinz, MD  07/14/2020 1:24 PM    [facility]

## 2020-07-14 ENCOUNTER — Other Ambulatory Visit: Payer: Self-pay

## 2020-07-14 ENCOUNTER — Encounter: Payer: Self-pay | Admitting: Cardiology

## 2020-07-14 ENCOUNTER — Ambulatory Visit (INDEPENDENT_AMBULATORY_CARE_PROVIDER_SITE_OTHER): Payer: 59 | Admitting: Cardiology

## 2020-07-14 VITALS — BP 130/78 | HR 87 | Ht 69.0 in | Wt 232.0 lb

## 2020-07-14 DIAGNOSIS — I493 Ventricular premature depolarization: Secondary | ICD-10-CM

## 2020-07-14 DIAGNOSIS — I10 Essential (primary) hypertension: Secondary | ICD-10-CM | POA: Diagnosis not present

## 2020-07-14 DIAGNOSIS — I422 Other hypertrophic cardiomyopathy: Secondary | ICD-10-CM

## 2020-07-14 DIAGNOSIS — R079 Chest pain, unspecified: Secondary | ICD-10-CM | POA: Diagnosis not present

## 2020-07-14 DIAGNOSIS — Z720 Tobacco use: Secondary | ICD-10-CM

## 2020-07-14 MED ORDER — ADULT BLOOD PRESSURE CUFF LG KIT: 1.0000 | PACK | Freq: Once | 0 refills | Status: AC

## 2020-07-14 NOTE — Patient Instructions (Addendum)
Medication Instructions:  DECREASE triamterene/hctz to 1/2 tablet daily  *If you need a refill on your cardiac medications before your next appointment, please call your pharmacy*  Follow-Up: At [facility], you and your health needs are our priority.  As part of our continuing mission to provide you with exceptional heart care, we have created designated Provider Care Teams.  These Care Teams include your primary Cardiologist (physician) and Advanced Practice Providers (APPs -  Physician Assistants and Nurse Practitioners) who all work together to provide you with the care you need, when you need it.  We recommend signing up for the patient portal called "MyChart".  Sign up information is provided on this After Visit Summary.  MyChart is used to connect with patients for Virtual Visits (Telemedicine).  Patients are able to view lab/test results, encounter notes, upcoming appointments, etc.  Non-urgent messages can be sent to your provider as well.   To learn more about what you can do with MyChart, go to [email or web address].    Your next appointment:   2 weeks with pharmacist 2 months with Dr. Gardiner Rhyme  Other Instructions Please check your blood pressure at home twice daily, write it down.  Bring the blood pressure log and blood pressure cuff to your appointment with the pharmacist

## 2020-07-16 ENCOUNTER — Telehealth: Payer: 59 | Admitting: Internal Medicine

## 2020-07-17 ENCOUNTER — Other Ambulatory Visit: Payer: Self-pay | Admitting: Internal Medicine

## 2020-07-17 DIAGNOSIS — H538 Other visual disturbances: Secondary | ICD-10-CM

## 2020-07-17 DIAGNOSIS — E1169 Type 2 diabetes mellitus with other specified complication: Secondary | ICD-10-CM

## 2020-07-20 ENCOUNTER — Other Ambulatory Visit: Payer: Self-pay | Admitting: Pharmacist

## 2020-07-20 ENCOUNTER — Encounter: Payer: Self-pay | Admitting: Internal Medicine

## 2020-07-20 MED ORDER — NEBIVOLOL HCL 20 MG PO TABS
1.0000 | ORAL_TABLET | Freq: Two times a day (BID) | ORAL | 0 refills | Status: DC
Start: 2020-07-20 — End: 2020-07-28

## 2020-07-20 MED ORDER — AMLODIPINE BESYLATE 5 MG PO TABS: ORAL_TABLET | ORAL | 0 refills | Status: DC

## 2020-07-20 MED FILL — AMLODIPINE BESYLATE 5 MG TA: 5 | 30 days supply | Qty: 60 | Fill #0

## 2020-07-21 ENCOUNTER — Other Ambulatory Visit: Payer: Self-pay | Admitting: Internal Medicine

## 2020-07-21 ENCOUNTER — Other Ambulatory Visit: Payer: Self-pay

## 2020-07-21 ENCOUNTER — Ambulatory Visit (INDEPENDENT_AMBULATORY_CARE_PROVIDER_SITE_OTHER): Payer: 59

## 2020-07-21 DIAGNOSIS — Z0000 Encounter for general adult medical examination without abnormal findings: Secondary | ICD-10-CM

## 2020-07-21 MED FILL — MONTELUKAST SOD 10 MG TAB: 10 | 30 days supply | Qty: 30 | Fill #0

## 2020-07-21 MED FILL — busPIRone HCL 5 MG TABS: 5 | 30 days supply | Qty: 60 | Fill #2

## 2020-07-21 NOTE — Progress Notes (Signed)
Appointment Outcome:  Completed, Session #: 3 AGREEMENTS SECTION  Overall Goal(s): Smoking cessation                                         Agreement/Action Steps:  Space out smoking for 1 hour or more. Aim for 5 whole cigarettes per day Smoke 1/2 cigarette at a time.  Use nicotine gum and take Wellbutrin as prescribed. Count to 10 and take deep breathes when triggered. Perform self-checks to remind self of why you want to quit smoking. Use positive self-talk to encourage himself to not smoke.  Progress Notes:  Patient has been able to maintain smoking 5 whole cigarettes daily. He is smoking 1/2 cigarettes at a time and spacing out his smoking an hour at a time. Patient continues to use the nicotine gum and take the Wellbutrin as prescribed. Patient stated that the cigarettes still taste nasty, and he is not sure if it's the Wellbutrin or the gum. Patient stated that the gum might not be helping as much. Patient stated that Sr. Gardiner Rhyme informed him to change from a #2 gum to a #4. Patient stated that he was going to stop taking Wellbutrin to see if that's what's making the cigarettes taste nasty and choke on cigarettes. Patient stated that although he is using the gum and medication, his cravings have intensified and still wants to smoke more.  Patient stated that he does check-in with himself to remind himself why he wants to quit smoking. Patient shared that he tells himself that he doesn't need a cigarette and if he has one in his hand, he won't light it. Patient stated that holding a cigarette does not tempt him to want to smoke because he has talked himself out of it at the moment.   Patient stated that he has not been counting or deep breathing lately because he either goes to sleep or forget about what he has or is dealing with. Patient stated that he tends to remove himself from situations that are stress provoking. Patient stated that stress does not impact his urge to smoke. Patient  shared that it would have to be something that makes him extremely angry to smoke as a way to cope and he would only do it to avoid confrontation.    Patient stated that he feels frustrated because he hasn't quit as he wishes.   . Indicators of Success and Accountability:  Managing not to smoke more than 5 cigarettes per day although he feels his cravings have intensified.   . Readiness: Patient is in the action phase of smoking cessation.   . Strengths and Supports: Patient stated that he has the will to try to implement his steps towards smoking cessation. . Challenges and Barriers: Patient does not foresee any challenges.  Coaching Outcomes: Patient was asked what it will take to get him to reduce his smoking. Patient stated that it would have to be something medical, or it has to click in his head. Patient was asked if he wanted to challenge himself over the next two weeks to cut down to 4 cigarettes a day. Patient stated that he knows himself and reducing how many cigarettes he smokes each week is not going to work for him. This is something that just have happen the patient stated. Patient was asked if he had a quit date in mind. Patient stated that he just  has to do it and not set such a goal. Patient stated that he wants to go from 5 cigarettes to none without an in between. Patient was asked how the health coach can best support him in this decision and hold him accountable. Patient stated that he is the only one that can hold himself accountable for his behavior. Patient was reassured that he will be supported and encouraged while he is working towards smoking cessation.   Patient was encouraged to continue to taking Wellbutrin as prescribed by Dr. Gardiner Rhyme. Patient will continue to follow action steps outlined above over the next two weeks with the aim of not smoking any cigarettes. Patient will purchase the #4 nicotine gum per Dr. Gardiner Rhyme. Patient will look for chew sticks for smoking  cessation on [place] in the next two weeks.   Attempted: Marland Kitchen Fulfilled - Patient stated that he still smokes 5 whole cigarettes daily by smoking 1/2 cigarette at a time, spacing out smoking for an hour, using the nicotine gum and taking the Wellbutrin, performing self-checks, and using positive self-talk.  . Not met - Patient has not incorporated counting and deep breaths when triggered.

## 2020-07-22 ENCOUNTER — Other Ambulatory Visit: Payer: Self-pay | Admitting: Internal Medicine

## 2020-07-22 MED ORDER — DOXYCYCLINE HYCLATE 100 MG PO TABS
100.0000 mg | ORAL_TABLET | Freq: Two times a day (BID) | ORAL | 0 refills | Status: DC
Start: 2020-07-22 — End: 2020-07-22

## 2020-07-23 ENCOUNTER — Other Ambulatory Visit: Payer: Self-pay | Admitting: Orthopedic Surgery

## 2020-07-23 MED FILL — BACLOFEN 10 MG TABLET: 10 | 30 days supply | Qty: 90 | Fill #0

## 2020-07-23 MED FILL — DOXYCYCLINE HYCLATE 100 MG: 100 | 7 days supply | Qty: 14 | Fill #0

## 2020-07-27 NOTE — Progress Notes (Signed)
Patient ID: [name]                 DOB: 06/23/1955                      MRN: [id number]      HPI: Joseph Harvey is a 65 y.o. male referred by Joseph Harvey to HTN clinic. PMH is significant for prostate cancer in remission, hypertrophic cardiomyopathy, T2DM, HTN, HLD, and tobacco use. Cardiac MRI 06/23/2020 showed asymmetric hypertrophy measuring up to 20 mm and basal anterior septum (8 mm and posterior wall), consistent with hypertrophic cardiomyopathy. Normal LV/RV size and systolic function. 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  Last seen by Joseph Harvey 3/1 at which time pt was normotensive and triamterene-HCTZ dose was cut in half to 18.75-12.5 mg daily as suspecting diuretic worsens his LVOT obstruction. Pt referred to pharmacy hypertension clinic to wean off diuretics and start an alternative antihypertensive agent if needed. On 3/7, patient reported swelling in ankles after taking half of triamterene-HCTZ so he took a whole tablet the next day. Pt was recommended to monitor salt intake, elevate feet, wear compression stockings, weigh daily and to report if swelling does not resolve. Of note, in 2014 pulmonology discontinued lisinopril and switched him to olmesartan due to concern for ACEi related bradykinins worsening his asthma/shortness of breath.   Today, patient reports that he has been taking a whole tablet of triamterene-HCTZ 37.5-25 mg instead of half as instructed at last visit with Joseph Harvey because he experienced progressive swelling after taking the half tablet for about 5 days. Reports that elevating feet and cutting out salt did not improve swelling. After resuming 1 tablet daily, the swelling resolved. He reports the same experience happened when he was taken off this medication in the past. Endorses occasionally lightheadedness when he lays down, but this resolves with focusing on his breathing. Denies headaches, blurred vision. He has taken his medications  this morning. He confirms that lisinopril was stopped by his pulmonologist in 2014 to help with his breathing. Candesartan and olmesartan are listed on his past medication list but he is unfamiliar with these, thinks maybe they did not work to lower his BP. Requests refills for amlodipine and Bystolic.   Current HTN meds:  Amlodipine 5 mg BID (1EX/5TZ) Bystolic 20 mg BID (0YF/7CB) Triamterene-HCTZ 37.5-25 mg daily (5am)  Previously tried:  Lisinopril (allergy listed: SOB; discontinued by pulmonologist, see note above)  BP goal: <130/62mHg  Family History: Asthma in his mother; Colon cancer in his father; Diabetes in his maternal grandmother; Lung cancer in his father and mother; Prostate cancer in his father  Social History: Current every day smoker (trying to quit, currently down to 5 cigarettes/day), alcohol use  Diet: Denies adding salt to food. Eats eggs, yogurt, PB&J, tuna fish. Has cut out pizza, [place], [employer]. Drinks a lot of water.  Exercise: Has not been exercising, wants to be cautious with exercise given his heart condition  Home Weights: Reports weights in the 230s  Home BP readings: Checks multiple times throughout the day, uses a bicep cuff. Mostly 130s/80s lately (highest SBP 160, lowest 110s)  Wt Readings from Last 3 Encounters:  07/28/20 231 lb 6.4 oz (105 kg)  07/14/20 232 lb (105.2 kg)  05/18/20 233 lb 9.6 oz (106 kg)   BP Readings from Last 3 Encounters:  07/28/20 136/90  07/14/20 130/78  05/18/20 128/76   Pulse Readings from Last 3 Encounters:  07/28/20  88  07/14/20 87  05/18/20 91    Renal function: CrCl cannot be calculated (Patient's most recent lab result is older than the maximum 21 days allowed.).  Past Medical History:  Diagnosis Date  . Allergy    Dust, mold, dust mites  . Anemia   . Asthma   . Cancer [facility])    prostate  . Cataract    bilateral repair.  . Diabetes mellitus without complication ([place])   . Glaucoma   .  Hyperlipidemia   . Hypertension   . 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 Pneumonia     Current Outpatient Medications on File Prior to Visit  Medication Sig Dispense Refill  . albuterol (PROVENTIL) (2.5 MG/3ML) 0.083% nebulizer solution TAKE 3 MLS (2.5 MG TOTAL) BY NEBULIZATION EVERY 4 (FOUR) HOURS AS NEEDED FOR WHEEZING OR SHORTNESS OF BREATH (((PLAN B))). 150 mL 2  . amLODipine (NORVASC) 5 MG tablet Take 2 tablets daily for blood pressure. 60 tablet 0  . ASPIRIN 81 PO Take 81 mg by mouth daily.    . baclofen (LIORESAL) 10 MG tablet Take 10 mg by mouth daily.     . Blood Glucose Monitoring Suppl (TRUE METRIX METER) w/Device KIT Use as directed 1 kit 0  . budesonide-formoterol (SYMBICORT) 160-4.5 MCG/ACT inhaler Inhale 2 puffs into the lungs in the morning and at bedtime. 1 each 11  . buPROPion (WELLBUTRIN XL) 150 MG 24 hr tablet TAKE 1 TABLET BY MOUTH EVERY DAY 90 tablet 0  . busPIRone (BUSPAR) 5 MG tablet Take 1 tablet (5 mg total) by mouth 3 (three) times daily. 90 tablet 5  . Cholecalciferol (VITAMIN D PO) Take 1 tablet by mouth daily.    Marland Kitchen doxycycline (VIBRA-TABS) 100 MG tablet Take 1 tablet (100 mg total) by mouth 2 (two) times daily. for 10 days 14 tablet 0  . fluticasone (FLONASE) 50 MCG/ACT nasal spray PLACE 2 SPRAYS INTO BOTH NOSTRILS DAILY. 16 g 11  . gabapentin (NEURONTIN) 300 MG capsule Take 1 capsule (300 mg total) by mouth daily as needed. 90 capsule 1  . glucose blood (TRUE METRIX BLOOD GLUCOSE TEST) test strip Use as instructed 100 each 12  . ipratropium-albuterol (DUONEB) 0.5-2.5 (3) MG/3ML SOLN USE VIAL BY NEBULIZATION 2 (TWO) TIMES DAILY. 180 mL 2  . LORazepam (ATIVAN) 1 MG tablet Take 1 tablet (20m) before cardiac MRI 1 tablet 0  . metFORMIN (GLUCOPHAGE-XR) 500 MG 24 hr tablet TAKE 1 TABLET BY MOUTH DAILY. 90 tablet 0  . montelukast (SINGULAIR) 10 MG tablet TAKE 1 TABLET (10 MG TOTAL) BY MOUTH AT BEDTIME. 30 tablet 3  . Nebivolol HCl  (BYSTOLIC) 20 MG TABS Take 1 tablet (20 mg total) by mouth 2 (two) times daily. 60 tablet 0  . pantoprazole (PROTONIX) 40 MG tablet TAKE 1 TABLET (40 MG TOTAL) BY MOUTH DAILY. 90 tablet 1  . polyethylene glycol powder (GLYCOLAX/MIRALAX) 17 GM/SCOOP powder Take 17 g by mouth daily as needed. 3350 g 1  . potassium chloride SA (KLOR-CON) 20 MEQ tablet Take 20 mEq by mouth as needed. When hands cramp up    . predniSONE (DELTASONE) 10 MG tablet TAKE 3 TABLETS BY MOUTH DAILY FOR 2 DAYS,THEN 2 TABS DAILY FOR 2 DAYS,THEN 1 TABS DAILY FOR 2 DAYS, 12 tablet 0  . Respiratory Therapy Supplies (FLUTTER) DEVI Use three times a day after inhaler or nebulizer use 1 each 0  . rosuvastatin (CRESTOR) 40 MG tablet Take 1 tablet (  40 mg total) by mouth daily. 90 tablet 3  . triamterene-hydrochlorothiazide (MAXZIDE-25) 37.5-25 MG tablet Take 0.5 tablets by mouth daily.    . TRUEplus Lancets 28G MISC Use as directed 100 each 4  . VENTOLIN HFA 108 (90 Base) MCG/ACT inhaler INHALE 2 PUFFS INTO THE LUNGS EVERY 4 (FOUR) HOURS AS NEEDED FOR WHEEZING OR SHORTNESS OF BREATH. 18 g 2   No current facility-administered medications on file prior to visit.    Allergies  Allergen Reactions  . Amoxicillin Anaphylaxis    Has patient had a PCN reaction causing immediate rash, facial/tongue/throat swelling, SOB or lightheadedness with hypotension: Yes Has patient had a PCN reaction causing severe rash involving mucus membranes or skin necrosis: No Has patient had a PCN reaction that required hospitalization: Yes Has patient had a PCN reaction occurring within the last 10 years: Yes If all of the above answers are "NO", then may proceed with Cephalosporin use.   Marland Kitchen Lisinopril Shortness Of Breath  . Molds & Smuts Anaphylaxis  . Robitussin [Guaifenesin] Shortness Of Breath    wheezing  . Azithromycin Other (See Comments)     Assessment/Plan:  1. Hypertension - BP elevated above goal <130/65mHg. Per Dr. SGardiner Harvey goal is to  wean patient off diuretics to another BP agent if needed due to suspicion that diuretics were worsening his LVOT obstruction. Given that patient has been taking whole tablet of triamterene-HCTZ due to worsening swelling with half tablet, will reach out to Dr. SGardiner Rhymeto see if all diuretics are contraindicated given his hypertrophic cardiomyopathy, or if an aldosterone antagonist or loop diuretic would be acceptable to use. His weight is currently stable from his last visit. Will call patient after discussion with Dr. SGardiner Rhymeto update him on the plan. For now, continue current medications. Can consider adding ARB therapy as well. Educated patient to continue monitoring BP at home, limiting salt intake, and incorporating exercise such as walking into his routine as able. Provided patient with refills for amlodipine and Bystolic, [facility]   MRebbeca Paul PharmD PGY1 Pharmacy Resident 07/28/2020 9:43 AM

## 2020-07-28 ENCOUNTER — Other Ambulatory Visit: Payer: Self-pay | Admitting: Cardiology

## 2020-07-28 ENCOUNTER — Other Ambulatory Visit: Payer: Self-pay

## 2020-07-28 ENCOUNTER — Ambulatory Visit (INDEPENDENT_AMBULATORY_CARE_PROVIDER_SITE_OTHER): Payer: 59 | Admitting: Student-PharmD

## 2020-07-28 VITALS — BP 136/90 | HR 88 | Wt 231.4 lb

## 2020-07-28 DIAGNOSIS — I10 Essential (primary) hypertension: Secondary | ICD-10-CM

## 2020-07-28 MED ORDER — AMLODIPINE BESYLATE 5 MG PO TABS
5.0000 mg | ORAL_TABLET | Freq: Two times a day (BID) | ORAL | 5 refills | Status: DC
Start: 2020-07-28 — End: 2020-07-28

## 2020-07-28 MED ORDER — NEBIVOLOL HCL 20 MG PO TABS
1.0000 | ORAL_TABLET | Freq: Two times a day (BID) | ORAL | 5 refills | Status: DC
Start: 2020-07-28 — End: 2020-07-28

## 2020-07-28 NOTE — Patient Instructions (Addendum)
It was nice to see you today!  Your goal blood pressure is <130/80 mmHg. In clinic, your blood pressure was 136/90 mmHg.  Continue your current medications: amlodipine 5 mg twice daily, Bystolic 20 mg twice daily, triamterene-HCTZ 37.5-25 mg twice daily.   The goal is to get you off your triamterene-HCTZ. We are going to message your cardiologist about switching this medication and will call you with any changes.   Monitor blood pressure at home daily and keep a log (on your phone or piece of paper) to bring with you to your next visit. Write down date, time, blood pressure and pulse.  Keep up the good work with diet and exercise. Aim for a diet full of vegetables, fruit and lean meats (chicken, [place], fish). Try to limit salt intake by eating fresh or frozen vegetables (instead of canned), rinse canned vegetables prior to cooking and do not add any additional salt to meals.   Please give [place] a call at [phone] with any questions or concerns.

## 2020-07-30 ENCOUNTER — Other Ambulatory Visit: Payer: Self-pay | Admitting: Cardiology

## 2020-07-30 ENCOUNTER — Telehealth: Payer: Self-pay | Admitting: Pharmacist

## 2020-07-30 MED ORDER — VALSARTAN 160 MG PO TABS: 160.0000 mg | ORAL_TABLET | Freq: Every day | ORAL | 11 refills | Status: DC

## 2020-07-30 NOTE — Telephone Encounter (Signed)
Patient returned call to clinic. Agreeable with medication changes as below:  -Discontinue triamterene-HCTZ -Start valsartan 160 mg once daily -Continue amlodipine and Bystolic  Sent prescription for valsartan to [facility]. Will check BMET at follow up appointment in 3 weeks. Emphasized lifestyle modifications of limiting sodium intake, elevating legs, and wearing compression socks to help reduce swelling as we are stopping his diuretic.

## 2020-07-30 NOTE — Telephone Encounter (Signed)
-----   Message from Donato Heinz, MD sent at 07/29/2020 12:17 AM EDT ----- Denna Haggard, We generally try to avoid all diuretics in HCM, as it can worsen the LVOT obstruction.  His LVOT obstruction is mild, but I would favor avoiding diuretics if we can Thanks, Gerald Stabs  ----- Message ----- From: Leeroy Bock, RPH-CPP Sent: 07/28/2020   9:56 AM EDT To: Donato Heinz, MD  Hi Dr Gardiner Rhyme,  I'm not as familiar with treatment for hypertrophic cardiomyopathy/LVOT obstruction. Pt did not decrease his triamterene-HCTZ to 1/2 tab as directed because of worsening swelling and doesn't feel confident that he can stop diuretic therapy. Are we able to use a loop diuretic or aldosterone antagonist or are all classes of diuretics contraindicated in these patients?   Thanks! Jinny Blossom

## 2020-07-30 NOTE — Telephone Encounter (Signed)
Called patient to discuss discontinuing triamterene-HCTZ and starting valsartan 160 mg daily as Dr. Gardiner Rhyme wants to avoid all diuretics in this patient. Will need to emphasize salt restriction, elevating legs, and wearing compression socks to help with swelling, and to schedule visit with HTN clinic in 3 weeks for follow up and BMET.

## 2020-08-02 ENCOUNTER — Other Ambulatory Visit: Payer: Self-pay | Admitting: Internal Medicine

## 2020-08-04 ENCOUNTER — Other Ambulatory Visit: Payer: Self-pay

## 2020-08-04 ENCOUNTER — Ambulatory Visit (INDEPENDENT_AMBULATORY_CARE_PROVIDER_SITE_OTHER): Payer: 59

## 2020-08-04 DIAGNOSIS — Z0000 Encounter for general adult medical examination without abnormal findings: Secondary | ICD-10-CM

## 2020-08-04 NOTE — Progress Notes (Signed)
Appointment Outcome:  Completed, Session #: 4  AGREEMENTS SECTION   Overall Goal(s): Smoking cessation                                         Agreement/Action Steps:  Space out smoking for 1 hour or more. Aim for 5 whole cigarettes per day Smoke 1/2 cigarette at a time.  Use nicotine gum and take Wellbutrin as prescribed. Count to 10 and take deep breathes when triggered. Perform self-checks to remind self of why you want to quit smoking. Use positive self-talk to encourage himself to not smoke.   Progress Notes:  Patient is more concerned about the side effects he is experiencing since starting the new blood pressure medication he is taking now rather than smoking. He also tried to figure out if the medication was interacting with the Wellbutrin, so he stops taking it for about four days to see if it was making him feel bad. Patient recognized that he cannot drink coffee with this blood pressure medication. He was experiencing dizziness and shortness of breath.  Patient feels that he is in the same space with smoking. Patient is still spacing out smoking for at least an hour. Patient stated that he does not relight a cigarette after smoking 1/2 of it. He does not like the taste and it deters him from smoking more. He also does not like the smell to be in his clothes.   Patient reported smoking less since not taking Wellbutrin, but he has been sleeping more since he has been feeling bad. This may have attributed to the reduction in cravings for smoking. Patient stated that when taking Wellbutrin, it makes him want to smoke more. Patient is not sure if Wellbutrin is working for smoking cessation of if it helps to keep him calm.   Patient experienced a stressful event but managed to not smoke more than usual. Patient has been using self-check-ins and positive self-talk to deter smoking. Patient stated that he finds himself during these times telling himself that he doesn't need to smoke, and  he will just hold and look at the cigarette or put filter in mouth without lighting it.   Patient forgot to get the #4 nicotine gum since he has not been feeling well but plans to purchase some soon. Patient stated that he also forgot about checking [employer] for chew sticks for smoking cessation. Patient reported having more energy in the past few days. He is not sure what this is a result of.    . Indicators of Success and Accountability:  Not smoking more during the stressful period he experienced.  . Readiness: Patient is in the action phase of smoking cessation.  . Strengths and Supports: Patient is an advocate for his health.  . Challenges and Barriers: Patient is unsure what challenges he will face with smoking cessation over the next two weeks.    Coaching Outcomes: Patient stated that he will start taking Wellbutrin again by this weekend and will monitor his cravings for smoking compared to not taking the medication recently.   Patient will continue to implement action steps as outlined above. Patient has not set a quit date and is working to cut out smoking completely without reduction over time.   Patient will purchase #4 nicotine gum as soon as possible.   Patient is still highly motivated to quit smoking because of his health.  Patient's takeaway from today's session is that he is not smoking as much and never really finishes a cigarette.   Care Guide will follow up with Dr. Gardiner Rhyme regarding patient's experience with Wellbutrin to be able to best advise him on how to take the medication.    Attempted: Marland Kitchen Fulfilled - Patient continues to smoke 5 cigarettes daily, space out smoking for at least one hour, and smoke 1/2 cigarette at a time. Patient is performing self-check-ins and engaging in positive self-talk to deter smoking.  . Partial - Patient stopped taking Wellbutrin for four days in the past two weeks.  . Not met - Patient has not purchased the #4 nicotine gum yet. Patient has  not incorporated deep breathing or counting when stressed because he sleeps instead.

## 2020-08-09 ENCOUNTER — Encounter: Payer: Self-pay | Admitting: Internal Medicine

## 2020-08-09 ENCOUNTER — Other Ambulatory Visit: Payer: Self-pay | Admitting: Internal Medicine

## 2020-08-09 NOTE — Progress Notes (Signed)
Patient seen by Shanon Rosser, PA on 07/30/2020 requesting testosterone replacement.  Patient with history of prostate cancer status post radical prostatectomy.  His assessment is that patient's PSA is concerning for biochemical recurrence.  It appears based on prior records that PSA has been steadily rising since 2017.  He was uncomfortable prescribing testosterone replacement given this finding.  He plans to repeat PSA as well as testosterone level.  Based on findings he may need PSM a scan to evaluate for prostate cancer recurrence. DX: 1.  Prostate cancer-C 61 ED following radical prostatectomy-N 52.31 Primary hypogonadism-E 29.1 Stress incontinence N 39.3

## 2020-08-10 ENCOUNTER — Other Ambulatory Visit: Payer: Self-pay | Admitting: Internal Medicine

## 2020-08-10 MED ORDER — PREDNISONE 10 MG PO TABS
ORAL_TABLET | ORAL | 0 refills | Status: DC
Start: 2020-08-10 — End: 2020-08-10

## 2020-08-10 MED ORDER — BUPROPION HCL ER (XL) 150 MG PO TB24
150.0000 mg | ORAL_TABLET | Freq: Every day | ORAL | 0 refills | Status: DC
Start: 2020-08-10 — End: 2020-08-10

## 2020-08-10 NOTE — Telephone Encounter (Signed)
Please advise on this specific refills for steriod and antibiotics.

## 2020-08-11 MED FILL — predniSONE 10 MG TABS: 10 | 6 days supply | Qty: 12 | Fill #0

## 2020-08-12 ENCOUNTER — Telehealth: Payer: Self-pay

## 2020-08-12 NOTE — Telephone Encounter (Signed)
**Note De-Identified Gauge Winski Obfuscation** I started a Bystolic PA through covermymeds. Key: [id number]

## 2020-08-15 ENCOUNTER — Other Ambulatory Visit: Payer: Self-pay

## 2020-08-17 ENCOUNTER — Telehealth: Payer: Self-pay

## 2020-08-17 DIAGNOSIS — Z0000 Encounter for general adult medical examination without abnormal findings: Secondary | ICD-10-CM

## 2020-08-17 NOTE — Telephone Encounter (Signed)
Called patient to reschedule appointment with Care Guide on 08/18/20 at 9:15am to a later time in the same day. Left message for patient to call back at [phone].

## 2020-08-18 ENCOUNTER — Telehealth: Payer: Self-pay

## 2020-08-18 ENCOUNTER — Ambulatory Visit: Payer: Self-pay

## 2020-08-18 DIAGNOSIS — Z0000 Encounter for general adult medical examination without abnormal findings: Secondary | ICD-10-CM

## 2020-08-18 NOTE — Telephone Encounter (Signed)
Called patient during scheduled health coaching session. Patient did not answer. Left a message for patient to return Care Guide's call at [phone] to hold session over the phone or to reschedule.

## 2020-08-19 ENCOUNTER — Ambulatory Visit (INDEPENDENT_AMBULATORY_CARE_PROVIDER_SITE_OTHER): Payer: 59 | Admitting: Pharmacist

## 2020-08-19 ENCOUNTER — Other Ambulatory Visit: Payer: Self-pay

## 2020-08-19 ENCOUNTER — Other Ambulatory Visit: Payer: Self-pay | Admitting: Internal Medicine

## 2020-08-19 VITALS — BP 138/72 | HR 83

## 2020-08-19 DIAGNOSIS — I10 Essential (primary) hypertension: Secondary | ICD-10-CM | POA: Diagnosis not present

## 2020-08-19 LAB — BASIC METABOLIC PANEL
BUN/Creatinine Ratio: 15 (ref 10–24)
BUN: 17 mg/dL (ref 8–27)
CO2: 23 mmol/L (ref 20–29)
Calcium: 9.4 mg/dL (ref 8.6–10.2)
Chloride: 103 mmol/L (ref 96–106)
Creatinine, Ser: 1.17 mg/dL (ref 0.76–1.27)
Glucose: 122 mg/dL — ABNORMAL HIGH (ref 65–99)
Potassium: 4 mmol/L (ref 3.5–5.2)
Sodium: 143 mmol/L (ref 134–144)
eGFR: 70 mL/min/{1.73_m2} (ref >59)

## 2020-08-19 MED ORDER — AMLODIPINE BESYLATE 5 MG PO TABS
ORAL_TABLET | Freq: Two times a day (BID) | ORAL | 5 refills | Status: DC
  Filled 2020-08-19: qty 60, 30d supply, fill #0

## 2020-08-19 NOTE — Patient Instructions (Addendum)
It was nice to see you today  Your blood pressure goal is < 130/64mmHg  If your labs are stable, we'll plan to increase your valsartan dose to 320mg  daily  Continue taking your other medications and monitoring your blood pressure at home

## 2020-08-19 NOTE — Telephone Encounter (Signed)
**Note De-Identified Joseph Harvey Obfuscation** I called [employer] to check the progress of this Bystolic PA and was advised by Joseph Harvey that they faxed a form to Joseph Harvey office with more questions concerning this PA. I advised her that we have not received the fax/form and requested to do a urgent Bystolic PA over the phone.   Per Joseph Harvey we can answer the PA form questions over the phone (and we did) and that she can request that it be treated as urgent going forward but cannot guarantee that it will be as the original request was sent in as standard PA.  She states that we will be receiving a determination letter Joseph Harvey fax when it becomes available.

## 2020-08-19 NOTE — Progress Notes (Signed)
Patient ID: Joseph Harvey                 DOB: 02-01-1956                      MRN: [id number]      HPI: Joseph Harvey is a 65 y.o. male referred by Dr. Gardiner Harvey to HTN clinic. PMH is significant for prostate cancer in remission, hypertrophic cardiomyopathy, T2DM, HTN, HLD, and tobacco use. Cardiac MRI 06/23/2020 showed asymmetric hypertrophy measuring up to 20 mm and basal anterior septum (8 mm and posterior wall), consistent with hypertrophic cardiomyopathy. Normal LV/RV size and systolic function. 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 Triamterene-HCTZ was discontinued at last visit to avoid worsening of LVOT obstruction given pt's HCM, and valsartan 185m daily was started. Pt was encouraged to monitor salt intake, elevate feet, wear compression stockings, weigh daily and to report if swelling does not resolve. Discussed with pt that we will need to avoid diuretics given his HCM. Of note, in 2014 pulmonology discontinued ACEi and switched him to ARB due to concern for ACEi related bradykinins worsening his asthma/shortness of breath.   Pt presents today in good spirits. Reports tolerating his valsartan well. Initially felt dizzy for the first few days but he stopped drinking coffee in the AM and his symptoms disappeared. Also reports his chest pain resolved after starting valsartan. Does notice some SOB when walking in the grocery store; has had this in the past, still smoking ~5 cigarettes per day. Reports gaining about 5 lbs in the past month or so but does denies swelling. Avoids adding salt to his food, elevates his legs in the evening, and occasionally wears compression stockings.   Was on prednisone taper recently for SOB, finished course about 1 week ago. Home BP cuff brought to clinic today. Home readings 127/82 - 143/93. Home cuff measuring well - 141/75 compared to clinic reading of 138/72. States he is trying to get his Bystolic filled - looks like prior authorization was  submitted last week.  Current HTN meds:  Amlodipine 5 mg BID (51RA/3EN Bystolic 20 mg BID (54MH/6KG Valsartan 1653mdaily (5am)  Previously tried:  Lisinopril - allergy listed: SOB; discontinued by pulmonologist, see note above Triamterene-HCTZ 37.5-25 mg daily - d/c due to HCM  BP goal: <130/8010m  Family History: Asthma in his mother; Colon cancer in his father; Diabetes in his maternal grandmother; Lung cancer in his father and mother; Prostate cancer in his father  Social History: Current every day smoker (trying to quit, currently down to 5 cigarettes/day), alcohol use  Diet: Denies adding salt to food. Eats eggs, yogurt, PB&J, tuna fish. Has cut out pizza, [place], [place]. Drinks a lot of water.  Exercise: Has not been exercising, wants to be cautious with exercise given his heart condition  Home Weights: Reports weights in the 230s  Home BP readings: 134/87, 127/82, 141/93, 135/86, 143/93, 129/89, 136/80, 134/87  Wt Readings from Last 3 Encounters:  07/28/20 231 lb 6.4 oz (105 kg)  07/14/20 232 lb (105.2 kg)  05/18/20 233 lb 9.6 oz (106 kg)   BP Readings from Last 3 Encounters:  07/28/20 136/90  07/14/20 130/78  05/18/20 128/76   Pulse Readings from Last 3 Encounters:  07/28/20 88  07/14/20 87  05/18/20 91    Renal function: CrCl cannot be calculated (Patient's most recent lab result is older than the maximum 21 days allowed.).  Past Medical History:  Diagnosis Date  .  Allergy    Dust, mold, dust mites  . Anemia   . Asthma   . Cancer [facility])    prostate  . Cataract    bilateral repair.  . Diabetes mellitus without complication ([place])   . Glaucoma   . Hyperlipidemia   . Hypertension   . Neuromuscular disorder ([place])    nerve damage from back surgery  . Pneumonia     Current Outpatient Medications on File Prior to Visit  Medication Sig Dispense Refill  . albuterol (PROVENTIL) (2.5 MG/3ML) 0.083% nebulizer solution TAKE 3 MLS (2.5 MG TOTAL)  BY NEBULIZATION EVERY 4 (FOUR) HOURS AS NEEDED FOR WHEEZING OR SHORTNESS OF BREATH (((PLAN B))). 150 mL 2  . albuterol (VENTOLIN HFA) 108 (90 Base) MCG/ACT inhaler INHALE 2 PUFFS INTO THE LUNGS EVERY 4 (FOUR) HOURS AS NEEDED FOR WHEEZING OR SHORTNESS OF BREATH. 18 g 2  . amLODipine (NORVASC) 5 MG tablet TAKE 1 TABLET (5 MG TOTAL) BY MOUTH 2 (TWO) TIMES DAILY. 60 tablet 5  . ASPIRIN 81 PO Take 81 mg by mouth daily.    . baclofen (LIORESAL) 10 MG tablet Take 10 mg by mouth daily.     . baclofen (LIORESAL) 10 MG tablet TAKE 1 TABLET BY ORAL ROUTE 3 TIMES EVERY DAY 90 tablet 2  . baclofen (LIORESAL) 10 MG tablet TAKE 1 TABLET BY MOUTH 3 TIMES EVERY DAY 90 tablet 0  . Blood Glucose Monitoring Suppl (TRUE METRIX METER) w/Device KIT Use as directed 1 kit 0  . budesonide-formoterol (SYMBICORT) 160-4.5 MCG/ACT inhaler INHALE 2 PUFFS INTO THE LUNGS EVERY MORNING AND AT BEDTIME 10.2 g 6  . buPROPion (WELLBUTRIN XL) 150 MG 24 hr tablet TAKE 1 TABLET (150 MG TOTAL) BY MOUTH DAILY. 90 tablet 0  . busPIRone (BUSPAR) 5 MG tablet TAKE 1 TABLET (5 MG TOTAL) BY MOUTH 3 (THREE) TIMES DAILY. 90 tablet 5  . Cholecalciferol (VITAMIN D PO) Take 1 tablet by mouth daily.    Marland Kitchen doxycycline (VIBRA-TABS) 100 MG tablet TAKE 1 TABLET (100 MG TOTAL) BY MOUTH 2 (TWO) TIMES DAILY 14 tablet 0  . doxycycline (VIBRA-TABS) 100 MG tablet TAKE 1 TABLET BY MOUTH TWICE DAILY FOR 10 DAYS 20 tablet 2  . fluticasone (FLONASE) 50 MCG/ACT nasal spray PLACE 2 SPRAYS INTO BOTH NOSTRILS DAILY. 16 g 11  . gabapentin (NEURONTIN) 300 MG capsule Take 1 capsule (300 mg total) by mouth daily as needed. 90 capsule 1  . gabapentin (NEURONTIN) 300 MG capsule TAKE 1 CAPSULE BY MOUTH IN THE MORNING, TAKE 1 CAPSULE AT LUNCH AND TAKE 2 CAPSULES BY MOUTH AT BEDTIME 600 capsule 0  . gabapentin (NEURONTIN) 300 MG capsule TAKE 1 CAPSULE IN THE MORNING,TAKE 1 CAP AT LUNCH AND TAKE 2 CAPS AT BEDTIME 120 capsule 4  . glucose blood (TRUE METRIX BLOOD GLUCOSE TEST) test  strip Use as instructed 100 each 12  . ipratropium-albuterol (DUONEB) 0.5-2.5 (3) MG/3ML SOLN USE VIAL BY NEBULIZATION 2 (TWO) TIMES DAILY. 180 mL 2  . LORazepam (ATIVAN) 1 MG tablet Take 1 tablet (32m) before cardiac MRI 1 tablet 0  . metFORMIN (GLUCOPHAGE-XR) 500 MG 24 hr tablet TAKE 1 TABLET BY MOUTH DAILY. 90 tablet 0  . montelukast (SINGULAIR) 10 MG tablet TAKE 1 TABLET BY MOUTH AT BEDTIME 30 tablet 1  . Nebivolol HCl 20 MG TABS TAKE 1 TABLET (20 MG TOTAL) BY MOUTH 2 (TWO) TIMES DAILY. 60 tablet 5  . pantoprazole (PROTONIX) 40 MG tablet TAKE 1 TABLET (40 MG TOTAL) BY  MOUTH DAILY. 90 tablet 1  . polyethylene glycol powder (GLYCOLAX/MIRALAX) 17 GM/SCOOP powder Take 17 g by mouth daily as needed. 3350 g 1  . potassium chloride SA (KLOR-CON) 20 MEQ tablet Take 20 mEq by mouth as needed. When hands cramp up    . predniSONE (DELTASONE) 10 MG tablet TAKE 3 TABLETS BY MOUTH DAILY FOR 2 DAYS,THEN 2 TABS DAILY FOR 2 DAYS,THEN 1 TABS DAILY FOR 2 DAYS, 12 tablet 0  . Respiratory Therapy Supplies (FLUTTER) DEVI Use three times a day after inhaler or nebulizer use 1 each 0  . rosuvastatin (CRESTOR) 40 MG tablet Take 1 tablet (40 mg total) by mouth daily. 90 tablet 3  . TRUEplus Lancets 28G MISC Use as directed 100 each 4  . valsartan (DIOVAN) 160 MG tablet TAKE 1 TABLET (160 MG TOTAL) BY MOUTH DAILY. 30 tablet 11   No current facility-administered medications on file prior to visit.    Allergies  Allergen Reactions  . Amoxicillin Anaphylaxis    Has patient had a PCN reaction causing immediate rash, facial/tongue/throat swelling, SOB or lightheadedness with hypotension: Yes Has patient had a PCN reaction causing severe rash involving mucus membranes or skin necrosis: No Has patient had a PCN reaction that required hospitalization: Yes Has patient had a PCN reaction occurring within the last 10 years: Yes If all of the above answers are "NO", then may proceed with Cephalosporin use.   Marland Kitchen Lisinopril  Shortness Of Breath  . Molds & Smuts Anaphylaxis  . Robitussin [Guaifenesin] Shortness Of Breath    wheezing  . Azithromycin Other (See Comments)     Assessment/Plan:  1. Hypertension - BP elevated above goal <130/743mHg. Checking BMET today with recent valsartan initiation. If labs are stable, will increase valsartan to 3258mdaily. Continue amlodipine 43m44mID and Bystolic 44m78GND. Avoiding all diuretics due to pt's HCM. Pt encouraged to exercise as able and limit daily sodium intake to <2,000m39mt will keep follow up with Dr SchuGardiner Rhyme1 month, can follow in PharmD HTN clinic after if needed.  Birney Belshe E. Rayme Bui, PharmD, BCACP, CPP [id number] [id number]: [phone]: [phone]/2022 9:51 AM

## 2020-08-20 ENCOUNTER — Telehealth: Payer: Self-pay | Admitting: Pharmacist

## 2020-08-20 ENCOUNTER — Other Ambulatory Visit: Payer: Self-pay

## 2020-08-20 ENCOUNTER — Other Ambulatory Visit: Payer: Self-pay | Admitting: Internal Medicine

## 2020-08-20 MED ORDER — ALBUTEROL SULFATE HFA 108 (90 BASE) MCG/ACT IN AERS
2.0000 | INHALATION_SPRAY | RESPIRATORY_TRACT | 0 refills | Status: DC | PRN
  Filled 2020-08-20: qty 18, 16d supply, fill #0

## 2020-08-20 MED ORDER — IPRATROPIUM-ALBUTEROL 0.5-2.5 (3) MG/3ML IN SOLN
RESPIRATORY_TRACT | 2 refills | Status: DC
  Filled 2020-08-20: qty 180, 30d supply, fill #0
  Filled 2020-09-18: qty 180, 30d supply, fill #1

## 2020-08-20 MED ORDER — ALBUTEROL SULFATE (2.5 MG/3ML) 0.083% IN NEBU
2.5000 mg | INHALATION_SOLUTION | RESPIRATORY_TRACT | 2 refills | Status: DC | PRN
  Filled 2020-08-20: qty 150, 9d supply, fill #0
  Filled 2020-09-18: qty 150, 9d supply, fill #1
  Filled 2020-10-14: qty 150, 9d supply, fill #2

## 2020-08-20 MED ORDER — VALSARTAN 320 MG PO TABS
320.0000 mg | ORAL_TABLET | Freq: Every day | ORAL | 3 refills | Status: DC
  Filled 2020-08-20: qty 30, 30d supply, fill #0

## 2020-08-20 MED ORDER — AMLODIPINE BESYLATE 5 MG PO TABS
ORAL_TABLET | Freq: Two times a day (BID) | ORAL | 3 refills | Status: DC
  Filled 2020-08-20: qty 180, fill #0
  Filled 2020-08-20: qty 180, 90d supply, fill #0
  Filled 2020-11-19: qty 180, 90d supply, fill #1
  Filled 2021-03-07: qty 180, 90d supply, fill #2
  Filled 2021-04-30 – 2021-06-02 (×2): qty 180, 90d supply, fill #3
  Filled 2021-06-03 – 2021-07-12 (×3): qty 60, 30d supply, fill #0
  Filled 2021-08-12: qty 60, 30d supply, fill #1

## 2020-08-20 MED FILL — Valsartan Tab 160 MG: ORAL | Qty: 30 | Fill #0 | Status: CN

## 2020-08-20 NOTE — Telephone Encounter (Signed)
Requested medication (s) are due for refill today: no  Requested medication (s) are on the active medication list: yes   Future visit scheduled: yes  Notes to clinic:  One inhaler should last at least one month. If the patient is requesting refills earlier, contact the patient to check for uncontrolled symptoms   Requested Prescriptions  Pending Prescriptions Disp Refills   ipratropium-albuterol (DUONEB) 0.5-2.5 (3) MG/3ML SOLN 180 mL 2    Sig: USE VIAL BY NEBULIZATION 2 (TWO) TIMES DAILY.      Pulmonology:  Combination Products Passed - 08/20/2020 10:42 AM      Passed - Valid encounter within last 12 months    Recent Outpatient Visits           5 months ago Type 2 diabetes mellitus with obesity ([place])   [place] Ladell Pier, MD   7 months ago Essential hypertension   [place], Jarome Matin, RPH-CPP   8 months ago Panic disorder   [place], MD   8 months ago Essential hypertension   [place], Stephen L, RPH-CPP   9 months ago Type 2 diabetes mellitus with obesity [facility])   [place], MD       Future Appointments             In 2 weeks Ladell Pier, MD [place]   In 4 weeks Donato Heinz, MD [facility]               albuterol (PROVENTIL) (2.5 MG/3ML) 0.083% nebulizer solution 150 mL 2    Sig: TAKE 3 MLS (2.5 MG TOTAL) BY NEBULIZATION EVERY 4 (FOUR) HOURS AS NEEDED FOR WHEEZING OR SHORTNESS OF BREATH (((PLAN B))).      Pulmonology:  Beta Agonists Failed - 08/20/2020 10:42 AM      Failed - One inhaler should last at least one month. If the patient is requesting refills earlier, contact the patient to check for uncontrolled symptoms.      Passed - Valid encounter within last 12  months    Recent Outpatient Visits           5 months ago Type 2 diabetes mellitus with obesity [facility])   [place] Ladell Pier, MD   7 months ago Essential hypertension   [place], Jarome Matin, RPH-CPP   8 months ago Panic disorder   [place], MD   8 months ago Essential hypertension   [place], Stephen L, RPH-CPP   9 months ago Type 2 diabetes mellitus with obesity [facility])   [place], MD       Future Appointments             In 2 weeks Ladell Pier, MD [place]   In 4 weeks Donato Heinz, MD [facility], [facility]

## 2020-08-20 NOTE — Telephone Encounter (Signed)
BMET stable since stopping triamterene-HCTZ and starting valsartan 160mg  daily. BP still elevated in office yesterday, will further increase valsartan to 320mg  daily. Pt is aware of med change and will keep follow up with Dr Gardiner Rhyme in 1 month - will plan to recheck BMET at that time. Glucose mildly elevated - pt was not fasting when labs were drawn though. Med refill sent in as 90 day supply per pt request. He also prefers to continue taking amlodipine 5mg  BID rather than 10mg  daily, states he feels better with BID dosing.

## 2020-08-20 NOTE — Telephone Encounter (Signed)
Requested medication (s) are due for refill today:   Yes  Requested medication (s) are on the active medication list:   Yes  Future visit scheduled:   Yes not to be refilled before  being seen on 09/03/2020 per note   Last ordered: Provider to review for refill prior to OV   Requested Prescriptions  Pending Prescriptions Disp Refills   albuterol (VENTOLIN HFA) 108 (90 Base) MCG/ACT inhaler 18 g 0    Sig: INHALE 2 PUFFS INTO THE LUNGS EVERY 4 (FOUR) HOURS AS NEEDED FOR WHEEZING OR SHORTNESS OF BREATH.      Pulmonology:  Beta Agonists Failed - 08/19/2020  5:54 PM      Failed - One inhaler should last at least one month. If the patient is requesting refills earlier, contact the patient to check for uncontrolled symptoms.      Passed - Valid encounter within last 12 months    Recent Outpatient Visits           5 months ago Type 2 diabetes mellitus with obesity [facility])   [place] Ladell Pier, MD   7 months ago Essential hypertension   [place], Jarome Matin, RPH-CPP   8 months ago Panic disorder   [place], MD   8 months ago Essential hypertension   [place], Stephen L, RPH-CPP   9 months ago Type 2 diabetes mellitus with obesity [facility])   [place], MD       Future Appointments             In 2 weeks Ladell Pier, MD [place]   In 4 weeks Donato Heinz, MD [facility] [place], [facility]

## 2020-08-21 ENCOUNTER — Ambulatory Visit (INDEPENDENT_AMBULATORY_CARE_PROVIDER_SITE_OTHER): Payer: 59 | Admitting: Podiatry

## 2020-08-21 ENCOUNTER — Encounter: Payer: Self-pay | Admitting: Podiatry

## 2020-08-21 ENCOUNTER — Other Ambulatory Visit: Payer: Self-pay

## 2020-08-21 DIAGNOSIS — M79675 Pain in left toe(s): Secondary | ICD-10-CM | POA: Insufficient documentation

## 2020-08-21 DIAGNOSIS — B351 Tinea unguium: Secondary | ICD-10-CM | POA: Diagnosis not present

## 2020-08-21 DIAGNOSIS — M79674 Pain in right toe(s): Secondary | ICD-10-CM

## 2020-08-21 DIAGNOSIS — N289 Disorder of kidney and ureter, unspecified: Secondary | ICD-10-CM | POA: Diagnosis not present

## 2020-08-21 DIAGNOSIS — E119 Type 2 diabetes mellitus without complications: Secondary | ICD-10-CM

## 2020-08-21 NOTE — Progress Notes (Signed)
This patient returns to my office for at risk foot care.  This patient requires this care by a professional since this patient will be at risk due to having diabetes.  This patient presents to the office for diabetic foot exam.    This patient presents for at risk foot care today.  General Appearance  Alert, conversant and in no acute stress.  Vascular  Dorsalis pedis and posterior tibial  pulses are palpable  bilaterally.  Capillary return is within normal limits  bilaterally. Temperature is within normal limits  bilaterally.  Neurologic  Senn-Weinstein monofilament wire test diminished   bilaterally. Muscle power within normal limits bilaterally.  Nails Normal nails with no infection or drainage.. No evidence of bacterial infection or drainage bilaterally.  Orthopedic  No limitations of motion  feet .  No crepitus or effusions noted.  No bony pathology or digital deformities noted.  Skin  normotropic skin with no porokeratosis noted bilaterally.  No signs of infections or ulcers noted.   Diabetic neuropathy with no complications  Consent was obtained for treatment procedures.   No evidence of vascular or neurologic pathology.    Return office visit   1 year.                  Told patient to return for periodic foot care and evaluation due to potential at risk complications.   Gardiner Barefoot DPM

## 2020-08-24 ENCOUNTER — Other Ambulatory Visit: Payer: Self-pay

## 2020-08-25 ENCOUNTER — Encounter: Payer: Self-pay | Admitting: *Deleted

## 2020-08-26 ENCOUNTER — Other Ambulatory Visit: Payer: Self-pay

## 2020-08-27 ENCOUNTER — Other Ambulatory Visit: Payer: Self-pay

## 2020-08-27 MED FILL — Gabapentin Cap 300 MG: ORAL | 30 days supply | Qty: 120 | Fill #0 | Status: AC

## 2020-08-27 MED FILL — Budesonide-Formoterol Fumarate Dihyd Aerosol 160-4.5 MCG/ACT: RESPIRATORY_TRACT | 30 days supply | Qty: 10.2 | Fill #0 | Status: AC

## 2020-08-27 MED FILL — Nebivolol HCl Tab 20 MG (Base Equivalent): ORAL | 30 days supply | Qty: 60 | Fill #0 | Status: CN

## 2020-08-27 MED FILL — Baclofen Tab 10 MG: ORAL | 30 days supply | Qty: 90 | Fill #0 | Status: AC

## 2020-08-28 NOTE — Telephone Encounter (Signed)
**Note De-Identified Joseph Harvey Obfuscation** I called [employer] and was advised by Mickel Baas that they have been sending faxes to [phone] concerning the pts Bystolic/Nebivolol PA.  She states that they faxed a Bystolic PA form on 49/75/3005 with f/u up questions and the denial letter on 08/20/2020.  Per Mickel Baas they did deny the pt coverage of Bystolic/Nebivolol as he must first try and fail all formulary medications prior to them considering coverage of Bystolic. Formulary medications per Mickel Baas: Bisoprolol Atenolol Metoprolol Carvedilol  Acebutolol  Will forward this message to Dr Gardiner Rhyme and his nurse for advisement to the pt.

## 2020-08-31 ENCOUNTER — Other Ambulatory Visit: Payer: Self-pay

## 2020-08-31 ENCOUNTER — Other Ambulatory Visit: Payer: Self-pay | Admitting: Internal Medicine

## 2020-08-31 DIAGNOSIS — F410 Panic disorder [episodic paroxysmal anxiety] without agoraphobia: Secondary | ICD-10-CM

## 2020-08-31 MED ORDER — CARVEDILOL 25 MG PO TABS
25.0000 mg | ORAL_TABLET | Freq: Two times a day (BID) | ORAL | 3 refills | Status: DC
  Filled 2020-08-31: qty 60, 30d supply, fill #0

## 2020-08-31 MED ORDER — BUSPIRONE HCL 5 MG PO TABS
ORAL_TABLET | ORAL | 0 refills | Status: DC
  Filled 2020-08-31: qty 90, 30d supply, fill #0

## 2020-08-31 NOTE — Telephone Encounter (Signed)
See my chart message

## 2020-08-31 NOTE — Telephone Encounter (Signed)
Let's change to carvedilol 25 mg BID

## 2020-08-31 NOTE — Telephone Encounter (Signed)
Can switch from bystolic to carvedilol, given his dose of bystolic would do carvedilol 25 mg BID

## 2020-09-03 ENCOUNTER — Ambulatory Visit: Payer: 59 | Attending: Internal Medicine | Admitting: Internal Medicine

## 2020-09-03 ENCOUNTER — Other Ambulatory Visit: Payer: Self-pay

## 2020-09-03 DIAGNOSIS — J4550 Severe persistent asthma, uncomplicated: Secondary | ICD-10-CM | POA: Diagnosis not present

## 2020-09-03 DIAGNOSIS — I422 Other hypertrophic cardiomyopathy: Secondary | ICD-10-CM

## 2020-09-03 DIAGNOSIS — E1159 Type 2 diabetes mellitus with other circulatory complications: Secondary | ICD-10-CM | POA: Diagnosis not present

## 2020-09-03 DIAGNOSIS — F17200 Nicotine dependence, unspecified, uncomplicated: Secondary | ICD-10-CM

## 2020-09-03 DIAGNOSIS — E669 Obesity, unspecified: Secondary | ICD-10-CM

## 2020-09-03 DIAGNOSIS — E1169 Type 2 diabetes mellitus with other specified complication: Secondary | ICD-10-CM | POA: Diagnosis not present

## 2020-09-03 DIAGNOSIS — Z8546 Personal history of malignant neoplasm of prostate: Secondary | ICD-10-CM

## 2020-09-03 DIAGNOSIS — I152 Hypertension secondary to endocrine disorders: Secondary | ICD-10-CM

## 2020-09-03 MED ORDER — NEBIVOLOL HCL 20 MG PO TABS
20.0000 mg | ORAL_TABLET | Freq: Two times a day (BID) | ORAL | 5 refills | Status: DC
  Filled 2021-01-08 – 2021-02-05 (×4): qty 60, 30d supply, fill #0
  Filled 2021-03-07: qty 60, 30d supply, fill #1
  Filled 2021-04-04: qty 60, 30d supply, fill #2
  Filled 2021-04-30: qty 60, 30d supply, fill #3
  Filled 2021-06-02: qty 60, 30d supply, fill #4
  Filled 2021-06-03 – 2021-07-12 (×4): qty 60, 30d supply, fill #0
  Filled 2021-08-12 – 2021-08-13 (×2): qty 60, 30d supply, fill #1

## 2020-09-03 NOTE — Progress Notes (Signed)
Virtual Visit via Telephone Note  I connected with Joseph Harvey on 09/03/2020 at 9:51 a.m by telephone and verified that I am speaking with the correct person using two identifiers  Location: Patient: home Provider: office  Participants: Myself Patient CMA: [name]   I discussed the limitations, risks, security and privacy concerns of performing an evaluation and management service by telephone and the availability of in person appointments. I also discussed with the patient that there may be a patient responsible charge related to this service. The patient expressed understanding and agreed to proceed.   History of Present Illness: DM controlled, HL, HTN, HCM (followed by cardiologist Dr. 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  Pt was upset as he thought his visit today was going to be in person.  States he was contacted 3 times yesterday about his visit today.  First 2 calls did not indicate that it would be via phone.  The last call that he received at 5 PM, he was told by the caller that it would be a telephone visit.  I apologize that this happened to him.  I have told my staff to give patients the option of in person versus virtual visit.  However patient states he was not given an option.  I wanted to know whether he was okay with [place] proceeding with a telephone visit today.  Patient states that it is already set up so he agreed to proceed.  Informed him that I will speak with my medical assistant about this to prevent it from happening again.  HTN/HCM: He has been seeing the cardiologist.  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  Placed on Diovan instead.  He was also changed to carvedilol because it would have been  cheaper for him than the Bystolic which he had been on for quite a number of years.  His insurance stated that they will no longer cover Bystolic.  He experiences shortness of breath when he took the carvedilol so he discontinued it.  Found coupon on line where he can get 90 day supply on Bystolic for $16.  Request that I send a prescription to the [facility] for the Bystolic 20 mg twice a day which she was on previously Most recent blood pressure reading was BP 130/88.  Compliant with taking medications including amlodipine   Asthma:  Seen at UC a few days ago when he developed shortness of breath after taking carvedilol. Given Prednisone and abx for sinus infection  Taking Symbicort BID, Albuterol inhaler PRN.  Uses albutero/Atroventl neb 2 x/day and Albuterol neb 2-3 times a day Still smoking but not as much - 5-10 cigarettes a day.  Felt Wellbutrin made him wanted to smoke more. Smoked for 43 yrs, not as easy to quit  Has appt 09/11/2020 with Dr. Silas Flood his pulmonologist.  DM: not checking BS himself Has cut back on sodium in foods.  Not eating a lot of sweets and carbs.  I note that his last weight in the system done in March was 231 with a BMI of 34.  Weight in January of this year was 233 pounds. Takes Metformin when he takes Prednisone. Never receive call from Dr. Katy Fitch for his eye exam..  Hx of Prostate CA: has f/u with urology in 3 mths. PSA level increasing.  There was concern that he may have recurrence.   Outpatient Encounter Medications as of 09/03/2020  Medication Sig  . albuterol (PROVENTIL) (2.5 MG/3ML) 0.083% nebulizer solution TAKE 3 MLS (2.5 MG TOTAL) BY NEBULIZATION EVERY 4 (FOUR) HOURS AS NEEDED FOR WHEEZING OR SHORTNESS OF BREATH (((PLAN B))).  Marland Kitchen albuterol (VENTOLIN HFA) 108 (90 Base) MCG/ACT inhaler INHALE 2 PUFFS INTO THE LUNGS EVERY 4 (FOUR) HOURS AS NEEDED FOR WHEEZING OR SHORTNESS OF BREATH.  Marland Kitchen amLODipine (NORVASC) 5 MG tablet TAKE 1 TABLET (5 MG TOTAL) BY  MOUTH 2 (TWO) TIMES DAILY.  Marland Kitchen ASPIRIN 81 PO Take 81 mg by mouth daily.  . baclofen (LIORESAL) 10 MG tablet TAKE 1 TABLET BY ORAL ROUTE 3 TIMES EVERY DAY  . Blood Glucose Monitoring Suppl (TRUE METRIX METER) w/Device KIT Use as directed  . budesonide-formoterol (SYMBICORT) 160-4.5 MCG/ACT inhaler INHALE 2 PUFFS INTO THE LUNGS EVERY MORNING AND AT BEDTIME  . buPROPion (WELLBUTRIN XL) 150 MG 24 hr tablet TAKE 1 TABLET (150 MG TOTAL) BY MOUTH DAILY.  . busPIRone (BUSPAR) 5 MG tablet TAKE 1 TABLET (5 MG TOTAL) BY MOUTH 3 (THREE) TIMES DAILY.  . carvedilol (COREG) 25 MG tablet Take 1 tablet (25 mg total) by mouth 2 (two) times daily.  . Cholecalciferol (VITAMIN D PO) Take 1 tablet by mouth daily.  . fluticasone (FLONASE) 50 MCG/ACT nasal spray PLACE 2 SPRAYS INTO BOTH NOSTRILS DAILY.  Marland Kitchen gabapentin (NEURONTIN) 300 MG capsule TAKE 1 CAPSULE IN THE MORNING,TAKE 1 CAP AT LUNCH AND TAKE 2 CAPS AT BEDTIME  . glucose blood (TRUE METRIX BLOOD GLUCOSE TEST) test strip Use as instructed  . ipratropium-albuterol (DUONEB) 0.5-2.5 (3) MG/3ML SOLN USE VIAL BY NEBULIZATION 2 (TWO) TIMES DAILY.  Marland Kitchen LORazepam (ATIVAN) 1 MG tablet Take 1 tablet (40m) before cardiac MRI  . metFORMIN (GLUCOPHAGE-XR) 500 MG 24 hr tablet TAKE 1 TABLET BY MOUTH DAILY.  . montelukast (SINGULAIR) 10 MG tablet TAKE 1 TABLET BY MOUTH AT BEDTIME  . pantoprazole (PROTONIX) 40 MG tablet TAKE 1 TABLET (40 MG TOTAL) BY MOUTH DAILY.  .Marland Kitchenpolyethylene glycol powder (GLYCOLAX/MIRALAX) 17 GM/SCOOP powder Take 17 g by mouth daily as needed.  . potassium chloride SA (KLOR-CON) 20 MEQ tablet Take 20 mEq by mouth as needed. When hands cramp up  . predniSONE (DELTASONE) 10 MG tablet TAKE 3 TABLETS BY MOUTH DAILY FOR 2 DAYS,THEN 2 TABS DAILY FOR 2 DAYS,THEN 1 TABS DAILY FOR 2 DAYS,  . Respiratory Therapy Supplies (FLUTTER) DEVI Use three times a day after inhaler or nebulizer use  . rosuvastatin (CRESTOR) 40 MG tablet Take 1 tablet (40 mg total) by mouth daily.   . TRUEplus Lancets 28G MISC Use as directed  . valsartan (DIOVAN) 320 MG tablet Take 1 tablet (320 mg total) by mouth daily.   No facility-administered encounter medications on file as of 09/03/2020.      Observations/Objective: No direct observation done as this was a telephone encounter.    Chemistry      Component Value Date/Time   NA 143 08/19/2020 0943   K 4.0 08/19/2020 0943   CL 103 08/19/2020 0943   CO2 23 08/19/2020 0943   BUN 17 08/19/2020 0943   CREATININE 1.17 08/19/2020 0943      Component Value Date/Time   CALCIUM 9.4 08/19/2020 0943   ALKPHOS 76 04/14/2020 1036   AST 16 04/14/2020 1036   ALT 19 04/14/2020 1036   BILITOT 0.5 04/14/2020 1036     Lab Results  Component Value  Date   WBC 8.7 02/26/2020   HGB 16.4 02/26/2020   HCT 46.8 02/26/2020   MCV 93.8 02/26/2020   PLT 387.0 02/26/2020   Lab Results  Component Value Date   CHOL 278 (H) 11/15/2019   HDL 53 11/15/2019   LDLCALC 156 (H) 11/15/2019   TRIG 370 (H) 11/15/2019   CHOLHDL 5.2 (H) 11/15/2019     Assessment and Plan: 1. Hypertension associated with type 2 diabetes mellitus ([place]) I have sent the prescription for nebivolol to his pharmacy.  He has stopped carvedilol.  Continue amlodipine.  Continue low-salt diet. - Nebivolol HCl 20 MG TABS; Take 1 tablet (20 mg total) by mouth 2 (two) times daily.  Dispense: 180 tablet; Refill: 3  2. Type 2 diabetes mellitus with obesity ([place]) Continue metformin which he takes only when on prednisone.  Encourage healthy eating habits.  He will come to the lab to have an A1c checked.  I will resubmit the referral to the ophthalmologist - Hemoglobin A1c; Future - Ambulatory referral to Ophthalmology  3. Tobacco dependence Strongly advised to quit.  He knows that it is not improving his health.  He states that he is working on it but has not committed completely to quitting at this time.  4. Severe persistent asthma without complication Continue Symbicort  inhaler and nebulizer treatments.  Keep follow-up appointment with pulmonary  5. Hypertrophic cardiomyopathy ([place]) Medication changes made by cardiology noted including stopping the diuretic.  6. History of prostate cancer Keep follow-up with urology   Follow Up Instructions: 4 mth   I discussed the assessment and treatment plan with the patient. The patient was provided an opportunity to ask questions and all were answered. The patient agreed with the plan and demonstrated an understanding of the instructions.   The patient was advised to call back or seek an in-person evaluation if the symptoms worsen or if the condition fails to improve as anticipated.  I  Spent 23 minutes on this telephone encounter  Karle Plumber, MD

## 2020-09-09 ENCOUNTER — Other Ambulatory Visit: Payer: Self-pay | Admitting: Internal Medicine

## 2020-09-11 ENCOUNTER — Encounter: Payer: Self-pay | Admitting: Internal Medicine

## 2020-09-11 ENCOUNTER — Other Ambulatory Visit: Payer: Self-pay

## 2020-09-11 ENCOUNTER — Encounter: Payer: Self-pay | Admitting: Pulmonary Disease

## 2020-09-11 ENCOUNTER — Ambulatory Visit (INDEPENDENT_AMBULATORY_CARE_PROVIDER_SITE_OTHER): Payer: 59 | Admitting: Pulmonary Disease

## 2020-09-11 VITALS — BP 146/84 | HR 91 | Temp 98.1°F | Ht 69.0 in | Wt 237.0 lb

## 2020-09-11 DIAGNOSIS — F410 Panic disorder [episodic paroxysmal anxiety] without agoraphobia: Secondary | ICD-10-CM

## 2020-09-11 DIAGNOSIS — J301 Allergic rhinitis due to pollen: Secondary | ICD-10-CM | POA: Diagnosis not present

## 2020-09-11 DIAGNOSIS — J4540 Moderate persistent asthma, uncomplicated: Secondary | ICD-10-CM | POA: Diagnosis not present

## 2020-09-11 MED ORDER — BUSPIRONE HCL 10 MG PO TABS
10.0000 mg | ORAL_TABLET | Freq: Three times a day (TID) | ORAL | 3 refills | Status: DC
  Filled 2020-09-11: qty 90, 30d supply, fill #0
  Filled 2020-10-14: qty 90, 30d supply, fill #1
  Filled 2021-04-04: qty 90, 30d supply, fill #2

## 2020-09-11 MED ORDER — BREZTRI AEROSPHERE 160-9-4.8 MCG/ACT IN AERO: 2.0000 | INHALATION_SPRAY | Freq: Two times a day (BID) | RESPIRATORY_TRACT | 0 refills | Status: DC

## 2020-09-11 MED ORDER — BREZTRI AEROSPHERE 160-9-4.8 MCG/ACT IN AERO
2.0000 | INHALATION_SPRAY | Freq: Two times a day (BID) | RESPIRATORY_TRACT | 11 refills | Status: DC
  Filled 2021-01-08 – 2021-01-25 (×2): qty 10.7, 30d supply, fill #0
  Filled 2021-02-17 – 2021-02-22 (×2): qty 10.7, 30d supply, fill #1
  Filled 2021-03-17 – 2021-03-19 (×2): qty 10.7, 30d supply, fill #2
  Filled 2021-04-04 – 2021-04-18 (×3): qty 10.7, 30d supply, fill #3
  Filled 2021-05-10: qty 10.7, 30d supply, fill #4
  Filled ????-??-??: fill #4

## 2020-09-11 NOTE — Progress Notes (Signed)
Patient ID: Joseph Harvey, male    DOB: 1955/11/06, 65 y.o.   MRN: [id number]  Chief Complaint  Patient presents with  . Follow-up    Allergies.     Referring provider: Ladell Pier, MD  HPI:   Mr. Joseph Harvey is a 65 year old man whom we are seeing in follow-up for dyspnea exertion, asthma.  Cardiology note x2 reviewed.  In interim patient seen by cardiology.  Diagnosed with hypertrophic cardiomyopathy.  Medications being titrated.  He is pleased with his cardiology care.  He has intermittent dyspnea on exertion, triggered by environmental factors such as pain, pollen, heat MET.  Symbicort is helped.  Recent exacerbation 07/2020 with prednisone and antibiotics.  He admits that a lot of this seems anxiety driven, like a panic attack.  Is a bit on BuSpar 5 mg 3 times daily and says this is helped.  Although he thinks his panic attacks are not adequately controlled.  Advised him to discuss with his PCP.  HPI at initial visit: [name] last in 2016.  At last visit symptoms are well controlled with Symbicort.  Unclear exactly what occurred in the last 5 years.  But over the last several months he endorses worsening dyspnea on exertion.  Just walking around going to the gym he has become short of breath.  Endorses severe shortness of breath.  This is resolved within a minute or 2 of albuterol administration.  In general his cough is much better than prior.  Suspect he is adhered well to Dr. Gustavus Bryant instructions regarding his airway cough syndrome.  He does feel like he has significant nasal congestion and postnasal drip.  This produces mucus in the back of her throat needs to clear her cough up but feels much different than his prior cough.  Scheduled to have sinus surgery but this was discontinued due to wheezing per his report.  His insurance is changed and now needs to find a new ENT doctor.  When he gets bad bouts of that mucus buildup he will take a dose of prednisone and  within a minute, instantly the mucus feels better.  In terms of his dyspnea, rest improves his dyspnea.  There is no other aggravating or alleviating factors.  He has been on Breo for the last several months and says he thinks it helps somewhat with his breathing but is not at the level it was when he was followed by pulmonary prior.  Prior PFTs reviewed and interpreted as suggestive of mild restriction on spirometry, no fixed obstruction, lung volumes revealed TLC of 86% predicted, within normal limits or mildly reduced.  DLCO within normal limits.  Most recent chest x-ray 06/2018 reviewed instructed is clear lungs, current hyperinflation on PA film does not appear hyperinflated on lateral film.  PMH: Anxiety, obesity, diabetes, tobacco abuse, prostate cancer Surgical history: Lumbar back surgery Family History: Father with colon and lung cancer, mother with lung cancer Social history: Grew up in [place], lived there for 65 years, current smoker, 20+ pack year history   Questionaires / Pulmonary Flowsheets:   ACT:  Asthma Control Test ACT Total Score  02/26/2020 9    MMRC: No flowsheet data found.  Epworth:  No flowsheet data found.  Tests:   FENO:  No results found for: NITRICOXIDE  PFT: PFT Results Latest Ref Rng & Units 02/28/2020  FVC-Pre L 2.06  FVC-Predicted Pre % 52  FVC-Post L 2.30  FVC-Predicted Post % 59  Pre FEV1/FVC % %  70  Post FEV1/FCV % % 74  FEV1-Pre L 1.43  FEV1-Predicted Pre % 47  FEV1-Post L 1.70  DLCO uncorrected ml/min/mmHg 21.37  DLCO UNC% % 80  DLCO corrected ml/min/mmHg 22.15  DLCO COR %Predicted % 82  DLVA Predicted % 123    WALK:  No flowsheet data found.  Imaging: Reviewed as per EMR  Lab Results: Personally reviewed, no significant elevation of eosinophils, IgE and RAST panel negative in past CBC    Component Value Date/Time   WBC 8.7 02/26/2020 1007   RBC 4.99 02/26/2020 1007   HGB 16.4 02/26/2020 1007   HGB 16.0 11/15/2019 0935    HCT 46.8 02/26/2020 1007   HCT 47.8 11/15/2019 0935   PLT 387.0 02/26/2020 1007   PLT 361 11/15/2019 0935   MCV 93.8 02/26/2020 1007   MCV 93 11/15/2019 0935   MCH 31.1 11/15/2019 0935   MCH 31.3 06/20/2018 0309   MCHC 35.0 02/26/2020 1007   RDW 12.6 02/26/2020 1007   RDW 12.4 11/15/2019 0935   LYMPHSABS 3.2 02/26/2020 1007   MONOABS 0.7 02/26/2020 1007   EOSABS 0.1 02/26/2020 1007   BASOSABS 0.2 (H) 02/26/2020 1007    BMET    Component Value Date/Time   NA 143 08/19/2020 0943   K 4.0 08/19/2020 0943   CL 103 08/19/2020 0943   CO2 23 08/19/2020 0943   GLUCOSE 122 (H) 08/19/2020 0943   GLUCOSE 109 (H) 06/20/2018 0309   BUN 17 08/19/2020 0943   CREATININE 1.17 08/19/2020 0943   CALCIUM 9.4 08/19/2020 0943   GFRNONAA 59 (L) 04/14/2020 1036   GFRAA 68 04/14/2020 1036    BNP    Component Value Date/Time   BNP 25.1 07/24/2014 1256    ProBNP No results found for: PROBNP  Specialty Problems      Pulmonary Problems   Asthma    Formatting of this note might be different from the original. Overview:  Followed in Pulmonary clinic/ [facility]/ [place]  - stop acei 05/30/2012  - PFT's 07/25/2012  FEV1 2.08 (63%) 77 ratio and no change p B2 ,  DLCO 97 - hfa 90% 08/29/2012  > confirmed 10/29/2012  - allergy profile 12/10/2012 > eos 3.8% with IgE < 5 neg rast - try off singulair 04/29/13  -med calendar 12/27/2012  - 03/06/2015  extensive coaching HFA effectiveness =   90% > rechallenge with symbicort 160  2 bid  - Spirometry 03/16/2015  FEV1 1.25 (40%) ratio 77 p am symbicort   Last Assessment & Plan:  - stop acei 05/30/2012  - PFT's 07/25/2012  FEV1 2.08 (63%) 77 ratio and no change p B2 ,  DLCO 97 - hfa 90% 08/29/2012  > confirmed 10/29/2012  - allergy profile 12/10/2012 > eos 3.8% with IgE < 5 neg rast - try off singulair 04/29/13  -med calendar 12/27/2012  - 03/06/2015  extensive coaching HFA effectiveness =   90% > rechallenge with symbicort 160  2 bid  - Spirometry  03/16/2015  FEV1 1.25 (40%) ratio 77 p am symbicort   All goals of chronic asthma control met including optimal function (no residual airflow obst/ just pseudowheeze on exam) and elimination of symptoms with minimal need for rescue therapy.  Contingencies discussed in full including contacting this office immediately if not controlling the symptoms using the rule of two's.     Ok for surgery  I had an extended discussion with the patient reviewing all relevant studies completed to date and  lasting 15  to 20 minutes of a 25 minute visit    Each maintenance medication was reviewed in detail including most importantly the difference between maintenance and prns and under what circumstances the prns are to be triggered using an action plan format that is not reflected in the computer generated alphabetically organized AVS.    Please see instructions for details which were reviewed in writing and the patient given a copy highlighting the part that I personally wrote and discussed at today's ov.   Followed in Pulmonary clinic/ [facility]/ [place]  - stop acei 05/30/2012  - PFT's 07/25/2012  FEV1 2.08 (63%) 77 ratio and no change p B2 ,  DLCO 97 - hfa 90% 08/29/2012  > confirmed 10/29/2012  - allergy profile 12/10/2012 > eos 3.8% with IgE < 5 neg rast - try off singulair 04/29/13  -med calendar 12/27/2012  - 03/06/2015  extensive coaching HFA effectiveness =   90% > rechallenge with symbicort 160  2 bid  - Spirometry 03/16/2015  FEV1 1.25 (40%) ratio 77 p am symbicort   Last Assessment & Plan:  - stop acei 05/30/2012  - PFT's 07/25/2012  FEV1 2.08 (63%) 77 ratio and no change p B2 ,  DLCO 97 - hfa 90% 08/29/2012  > confirmed 10/29/2012  - allergy profile 12/10/2012 > eos 3.8% with IgE < 5 neg rast - try off singulair 04/29/13  -med calendar 12/27/2012  - 03/06/2015  extensive coaching HFA effectiveness =   90% > rechallenge with symbicort 160  2 bid  - Spirometry 03/16/2015  FEV1 1.25 (40%) ratio  77 p am symbicort   All goals of chronic asthma control met including optimal function (no residual airflow obst/ just pseudowheeze on exam) and elimination of symptoms with minimal need for rescue therapy.  Contingencies discussed in full including contacting this office immediately if not controlling the symptoms using the rule of two's.     Ok for surgery  I had an extended discussion with the patient reviewing all relevant studies completed to date and  lasting 15 to 20 minutes of a 25 minute visit    Each maintenance medication was reviewed in detail including most importantly the difference between maintenance and prns and under what circumstances the prns are to be triggered using an action plan format that is not reflected in the computer generated alphabetically organized AVS.    Please see instructions for details which were reviewed in writing and the patient given a copy highlighting the part that I personally wrote and discussed at today's ov.      Asthma, severe persistent    Followed in Pulmonary clinic/ [facility]/ [place]  - stop acei 05/30/2012  - PFT's 07/25/2012  FEV1 2.08 (63%) 77 ratio and no change p B2 ,  DLCO 97 - hfa 90% 08/29/2012  > confirmed 10/29/2012  - allergy profile 12/10/2012 > eos 3.8% with IgE < 5 neg rast - try off singulair 04/29/13  -med calendar 12/27/2012  - 03/06/2015  extensive coaching HFA effectiveness =   90% > rechallenge with symbicort 160  2 bid  - Spirometry 03/16/2015  FEV1 1.25 (40%) ratio 77 p am symbicort       Cough    Followed in Pulmonary clinic/ [facility]/ [place]  - Sinus CT  07/25/2012  > No evidence for acute or chronic sinus disease.        Allergic rhinitis      Allergies  Allergen Reactions  . Amoxicillin Anaphylaxis  Has patient had a PCN reaction causing immediate rash, facial/tongue/throat swelling, SOB or lightheadedness with hypotension: Yes Has patient had a PCN reaction causing severe rash  involving mucus membranes or skin necrosis: No Has patient had a PCN reaction that required hospitalization: Yes Has patient had a PCN reaction occurring within the last 10 years: Yes If all of the above answers are "NO", then may proceed with Cephalosporin use.   Marland Kitchen Lisinopril Shortness Of Breath  . Molds & Smuts Anaphylaxis  . Robitussin [Guaifenesin] Shortness Of Breath    wheezing  . Azithromycin Other (See Comments)    Immunization History  Administered Date(s) Administered  . DTaP 03/16/2012  . Influenza Split 02/07/2013, 02/14/2015, 01/21/2016  . Influenza Whole 04/15/2012, 02/10/2020  . Influenza, Seasonal, Injecte, Preservative Fre 02/07/2013, 02/14/2015, 01/21/2016, 01/27/2017  . Influenza,inj,Quad PF,6+ Mos 01/28/2019  . Influenza-Unspecified 02/07/2013, 02/07/2013, 02/14/2015, 02/14/2015, 01/21/2016, 01/21/2016, 12/22/2016, 12/22/2016, 01/27/2017, 01/27/2017  . PFIZER(Purple Top)SARS-COV-2 Vaccination 07/29/2019, 08/19/2019, 01/07/2020  . Pneumococcal Conjugate-13 03/16/2015  . Pneumococcal Polysaccharide-23 03/16/2012, 05/16/2013  . Tdap 12/19/2016  . Zoster Recombinat (Shingrix) 12/19/2016, 02/27/2017    Past Medical History:  Diagnosis Date  . Allergy    Dust, mold, dust mites  . Anemia   . Asthma   . Cancer [facility])    prostate  . Cataract    bilateral repair.  . Diabetes mellitus without complication ([place])   . Glaucoma   . Hyperlipidemia   . Hypertension   . 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 Pneumonia     Tobacco History: Social History   Tobacco Use  Smoking Status Current Every Day Smoker  . Packs/day: 0.50  . Years: 46.00  . Pack years: 23.00  . Types: Cigarettes  . Start date: 1976  Smokeless Tobacco Never Used  Tobacco Comment   still smoking 0.5 ppd   Ready to quit: Not Answered Counseling given: Not Answered Comment: still smoking 0.5 ppd     Outpatient Encounter Medications as of 09/11/2020   Medication Sig  . albuterol (PROVENTIL) (2.5 MG/3ML) 0.083% nebulizer solution TAKE 3 MLS (2.5 MG TOTAL) BY NEBULIZATION EVERY 4 (FOUR) HOURS AS NEEDED FOR WHEEZING OR SHORTNESS OF BREATH (((PLAN B))).  Marland Kitchen albuterol (VENTOLIN HFA) 108 (90 Base) MCG/ACT inhaler INHALE 2 PUFFS INTO THE LUNGS EVERY 4 (FOUR) HOURS AS NEEDED FOR WHEEZING OR SHORTNESS OF BREATH.  Marland Kitchen amLODipine (NORVASC) 5 MG tablet TAKE 1 TABLET (5 MG TOTAL) BY MOUTH 2 (TWO) TIMES DAILY.  Marland Kitchen ASPIRIN 81 PO Take 81 mg by mouth daily.  . baclofen (LIORESAL) 10 MG tablet TAKE 1 TABLET BY ORAL ROUTE 3 TIMES EVERY DAY  . Blood Glucose Monitoring Suppl (TRUE METRIX METER) w/Device KIT Use as directed  . Budeson-Glycopyrrol-Formoterol (BREZTRI AEROSPHERE) 160-9-4.8 MCG/ACT AERO Inhale 2 puffs into the lungs in the morning and at bedtime.  . Budeson-Glycopyrrol-Formoterol (BREZTRI AEROSPHERE) 160-9-4.8 MCG/ACT AERO Inhale 2 puffs into the lungs in the morning and at bedtime.  Marland Kitchen buPROPion (WELLBUTRIN XL) 150 MG 24 hr tablet TAKE 1 TABLET (150 MG TOTAL) BY MOUTH DAILY.  . busPIRone (BUSPAR) 5 MG tablet TAKE 1 TABLET (5 MG TOTAL) BY MOUTH 3 (THREE) TIMES DAILY.  Marland Kitchen Cholecalciferol (VITAMIN D PO) Take 1 tablet by mouth daily.  . fluticasone (FLONASE) 50 MCG/ACT nasal spray PLACE 2 SPRAYS INTO BOTH NOSTRILS DAILY.  Marland Kitchen gabapentin (NEURONTIN) 300 MG capsule TAKE 1 CAPSULE IN THE MORNING,TAKE 1 CAP AT LUNCH AND TAKE 2 CAPS AT BEDTIME  .  glucose blood (TRUE METRIX BLOOD GLUCOSE TEST) test strip Use as instructed  . ipratropium-albuterol (DUONEB) 0.5-2.5 (3) MG/3ML SOLN USE VIAL BY NEBULIZATION 2 (TWO) TIMES DAILY.  Marland Kitchen LORazepam (ATIVAN) 1 MG tablet Take 1 tablet (25m) before cardiac MRI  . metFORMIN (GLUCOPHAGE-XR) 500 MG 24 hr tablet TAKE 1 TABLET BY MOUTH DAILY.  . montelukast (SINGULAIR) 10 MG tablet TAKE 1 TABLET BY MOUTH AT BEDTIME  . Nebivolol HCl 20 MG TABS Take 1 tablet (20 mg total) by mouth 2 (two) times daily.  . pantoprazole (PROTONIX) 40 MG  tablet TAKE 1 TABLET (40 MG TOTAL) BY MOUTH DAILY.  .Marland Kitchenpolyethylene glycol powder (GLYCOLAX/MIRALAX) 17 GM/SCOOP powder Take 17 g by mouth daily as needed.  . potassium chloride SA (KLOR-CON) 20 MEQ tablet Take 20 mEq by mouth as needed. When hands cramp up  . Respiratory Therapy Supplies (FLUTTER) DEVI Use three times a day after inhaler or nebulizer use  . rosuvastatin (CRESTOR) 40 MG tablet Take 1 tablet (40 mg total) by mouth daily.  . TRUEplus Lancets 28G MISC Use as directed  . valsartan (DIOVAN) 320 MG tablet Take 1 tablet (320 mg total) by mouth daily.  . [DISCONTINUED] budesonide-formoterol (SYMBICORT) 160-4.5 MCG/ACT inhaler INHALE 2 PUFFS INTO THE LUNGS EVERY MORNING AND AT BEDTIME  . [DISCONTINUED] predniSONE (DELTASONE) 10 MG tablet TAKE 3 TABLETS BY MOUTH DAILY FOR 2 DAYS,THEN 2 TABS DAILY FOR 2 DAYS,THEN 1 TABS DAILY FOR 2 DAYS, (Patient not taking: Reported on 09/11/2020)   No facility-administered encounter medications on file as of 09/11/2020.     Review of Systems  Review of Systems  Denies chest pain with exertion.  No leg swelling, no orthopnea, PND. Physical Exam  BP (!) 146/84 (BP Location: Left Arm, Cuff Size: Normal)   Pulse 91   Temp 98.1 F (36.7 C)   Ht 5' 9" (1.753 m)   Wt 237 lb (107.5 kg)   SpO2 100%   BMI 35.00 kg/m   Wt Readings from Last 5 Encounters:  09/11/20 237 lb (107.5 kg)  07/28/20 231 lb 6.4 oz (105 kg)  07/14/20 232 lb (105.2 kg)  05/18/20 233 lb 9.6 oz (106 kg)  04/29/20 236 lb (107 kg)    BMI Readings from Last 5 Encounters:  09/11/20 35.00 kg/m  07/28/20 34.17 kg/m  07/14/20 34.26 kg/m  05/18/20 34.50 kg/m  04/29/20 34.85 kg/m     Physical Exam General: Well-appearing, [place]oman exam chair Eyes: EOMI, icterus Neck: No JVP appreciated, neck supple, Respiratory: Expiratory wheeze on left mid and upper lung field, clear on the right,, normal work of breathing Cardiovascular: Tachycardic, regular rhythm, no  murmurs Abdomen: Nondistended, bowel sounds present MSK: No joint effusion, no synovitis Neuro: Normal gait, no weakness Psych: Normal mood, full affect    Assessment & Plan:   Dyspnea on exertion: Suspect multifactorial.  Asthma as a contributor.  Mild wheezing on exam today.  Further discussion as below regarding asthma.  Other contributors include suspected anxiety/hyperventilation syndrome, obesity, deconditioning.  Denies any cardiac work-up, this must be considered in someone who is obese, diabetic, with hyperlipidemia. Recommend continued aggressive treatment of his anxiety given placebo-like reaction immediately to albuterol and prednisone.  He feels symptomatically improved with the addition of BuSpar recently.  However feels like things are not well controlled and may benefit from higher dose.  Asthma: He has atopic symptoms.  Symbicort helps.  Recent exacerbation with need for prednisone.  [facility]  [facility]acerbations.  To continue albuterol as needed as well as Singulair.  Do worry that sinus disease is contributing to possibly poorly controlled asthma.  Recent PFTs with significant bronchodilator response, no fixed obstruction, otherwise normal.  IgE and eosinophils not elevated on repeat testing most recently fall 2021.   Return in about 3 months (around 12/11/2020).   Lanier Clam, MD 09/11/2020

## 2020-09-11 NOTE — Patient Instructions (Addendum)
Nice to see you again  Stop Symbicort  Start Judithann Sauger - this is the same as Symbicort but adds an extra medicine to help with the breathing  Return to clinic in 3 months for follow with Dr. Silas Flood

## 2020-09-14 ENCOUNTER — Other Ambulatory Visit: Payer: Self-pay

## 2020-09-15 ENCOUNTER — Other Ambulatory Visit: Payer: Self-pay

## 2020-09-17 ENCOUNTER — Ambulatory Visit (INDEPENDENT_AMBULATORY_CARE_PROVIDER_SITE_OTHER): Payer: 59 | Admitting: Cardiology

## 2020-09-17 ENCOUNTER — Encounter: Payer: Self-pay | Admitting: Cardiology

## 2020-09-17 ENCOUNTER — Other Ambulatory Visit: Payer: Self-pay

## 2020-09-17 VITALS — BP 130/78 | HR 96 | Ht 69.0 in | Wt 238.0 lb

## 2020-09-17 DIAGNOSIS — R400 Somnolence: Secondary | ICD-10-CM | POA: Diagnosis not present

## 2020-09-17 DIAGNOSIS — I422 Other hypertrophic cardiomyopathy: Secondary | ICD-10-CM | POA: Diagnosis not present

## 2020-09-17 DIAGNOSIS — I10 Essential (primary) hypertension: Secondary | ICD-10-CM | POA: Diagnosis not present

## 2020-09-17 DIAGNOSIS — E785 Hyperlipidemia, unspecified: Secondary | ICD-10-CM

## 2020-09-17 DIAGNOSIS — I493 Ventricular premature depolarization: Secondary | ICD-10-CM | POA: Diagnosis not present

## 2020-09-17 DIAGNOSIS — R0600 Dyspnea, unspecified: Secondary | ICD-10-CM

## 2020-09-17 DIAGNOSIS — E669 Obesity, unspecified: Secondary | ICD-10-CM

## 2020-09-17 DIAGNOSIS — R0609 Other forms of dyspnea: Secondary | ICD-10-CM

## 2020-09-17 DIAGNOSIS — E1169 Type 2 diabetes mellitus with other specified complication: Secondary | ICD-10-CM

## 2020-09-17 NOTE — Progress Notes (Deleted)
Cardiology Office Note:    Date:  09/17/2020   ID:  Joseph Harvey, DOB 08/21/1955, MRN [id number]  PCP:  Ladell Pier, MD  Cardiologist:  None  Electrophysiologist:  None   Referring MD: Ladell Pier, MD   No chief complaint on file.   History of Present Illness:    Joseph Harvey is a 65 y.o. male with a hx of prostate cancer in remission, T2DM, hypertension, hyperlipidemia, toabbaco use who presents for follow-up.  He was referred by Dr. Wynetta Emery for evaluation of dyspnea on exertion, intially seen on 04/14/20.  Hospital he reports that he has been having chest pain with minimal exertion.  Also describes having chest pain.  Reports sharp left-sided chest pain, can occur at rest or with exertion.  Typically last for 10 minutes or so.  Also states he has been having occasional lightheadedness, denies any syncope.  Denies any lower extremity edema.  Reports BP has been anywhere from 110s to 160s when he checks at home.  Reports he had a prior test for sleep apnea which was negative.  He has smoked since age 40, less than 1 pack/day.  No known history of heart disease in his immediate family.  Echocardiogram on 03/18/2020 showed severe septal hypertrophy with otherwise mild concentric hypertrophy, mild intracavitary gradient (peak gradient 30 mmHg), normal biventricular function, grade 1 diastolic dysfunction, no significant valvular disease.  Lexiscan Myoview on 04/29/2020 showed normal perfusion, EF 57%.  Preventives monitor x3 days on 05/06/2020 showed 7 episodes of NSVT, longest lasting 3 beats, occasional PVCs (3.9% of beats).  Calcium score 117 on 04/29/2020 (81st percentile).  Cardiac MRI 06/23/2020 showed asymmetric hypertrophy measuring up to 20 mm and basal anterior septum (8 mm and posterior wall), consistent with hypertrophic cardiomyopathy.  Also with patchy LGE in basal septum and RV insertion sites consistent with HCM; LGE accounts for 11% of total myocardial mass.  Normal LV/RV  size and systolic function.  Since last clinic visit,  lipids  he reports that he has been doing okay.  Does have some dyspnea, improves with use of his nebulizer.  Reports occasional chest pain which he states is on the far left side of his chest, short duration.  Reports occasional lightheadedness, but none recently.  Denies any syncope.  Reports occasional lower extremity edema after sitting for long periods.  Denies any palpitations   BP Readings from Last 3 Encounters:  09/17/20 130/78  09/11/20 (!) 146/84  08/19/20 138/72   Wt Readings from Last 3 Encounters:  09/17/20 238 lb (108 kg)  09/11/20 237 lb (107.5 kg)  07/28/20 231 lb 6.4 oz (105 kg)    Past Medical History:  Diagnosis Date  . Allergy    Dust, mold, dust mites  . Anemia   . Asthma   . Cancer [facility])    prostate  . Cataract    bilateral repair.  . Diabetes mellitus without complication ([place])   . Glaucoma   . Hyperlipidemia   . Hypertension   . 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 Pneumonia     Past Surgical History:  Procedure Laterality Date  . BACK SURGERY  2017  . CATARACT EXTRACTION Bilateral   . COLONOSCOPY     2013  . COLONOSCOPY  10/03/2019  . KNEE ARTHROSCOPY  2005   right  . PROSTATE SURGERY    . ROBOT ASSISTED LAPAROSCOPIC RADICAL PROSTATECTOMY  03/2010    Current Medications: Current Meds  Medication  Sig  . albuterol (PROVENTIL) (2.5 MG/3ML) 0.083% nebulizer solution TAKE 3 MLS (2.5 MG TOTAL) BY NEBULIZATION EVERY 4 (FOUR) HOURS AS NEEDED FOR WHEEZING OR SHORTNESS OF BREATH (((PLAN B))).  Marland Kitchen albuterol (VENTOLIN HFA) 108 (90 Base) MCG/ACT inhaler INHALE 2 PUFFS INTO THE LUNGS EVERY 4 (FOUR) HOURS AS NEEDED FOR WHEEZING OR SHORTNESS OF BREATH.  Marland Kitchen amLODipine (NORVASC) 5 MG tablet TAKE 1 TABLET (5 MG TOTAL) BY MOUTH 2 (TWO) TIMES DAILY.  Marland Kitchen ASPIRIN 81 PO Take 81 mg by mouth daily.  . baclofen (LIORESAL) 10 MG tablet TAKE 1 TABLET BY ORAL ROUTE 3 TIMES EVERY DAY   . Blood Glucose Monitoring Suppl (TRUE METRIX METER) w/Device KIT Use as directed  . Budeson-Glycopyrrol-Formoterol (BREZTRI AEROSPHERE) 160-9-4.8 MCG/ACT AERO Inhale 2 puffs into the lungs in the morning and at bedtime.  . Budeson-Glycopyrrol-Formoterol (BREZTRI AEROSPHERE) 160-9-4.8 MCG/ACT AERO Inhale 2 puffs into the lungs in the morning and at bedtime.  Marland Kitchen buPROPion (WELLBUTRIN XL) 150 MG 24 hr tablet TAKE 1 TABLET (150 MG TOTAL) BY MOUTH DAILY.  . busPIRone (BUSPAR) 10 MG tablet Take 1 tablet (10 mg total) by mouth 3 (three) times daily.  . Cholecalciferol (VITAMIN D PO) Take 1 tablet by mouth daily.  . fluticasone (FLONASE) 50 MCG/ACT nasal spray PLACE 2 SPRAYS INTO BOTH NOSTRILS DAILY.  Marland Kitchen gabapentin (NEURONTIN) 300 MG capsule TAKE 1 CAPSULE IN THE MORNING,TAKE 1 CAP AT LUNCH AND TAKE 2 CAPS AT BEDTIME  . glucose blood (TRUE METRIX BLOOD GLUCOSE TEST) test strip Use as instructed  . ipratropium-albuterol (DUONEB) 0.5-2.5 (3) MG/3ML SOLN USE VIAL BY NEBULIZATION 2 (TWO) TIMES DAILY.  Marland Kitchen LORazepam (ATIVAN) 1 MG tablet Take 1 tablet (73m) before cardiac MRI  . metFORMIN (GLUCOPHAGE-XR) 500 MG 24 hr tablet TAKE 1 TABLET BY MOUTH DAILY.  . montelukast (SINGULAIR) 10 MG tablet TAKE 1 TABLET BY MOUTH AT BEDTIME  . Nebivolol HCl 20 MG TABS Take 1 tablet (20 mg total) by mouth 2 (two) times daily.  . pantoprazole (PROTONIX) 40 MG tablet TAKE 1 TABLET (40 MG TOTAL) BY MOUTH DAILY.  .Marland Kitchenpolyethylene glycol powder (GLYCOLAX/MIRALAX) 17 GM/SCOOP powder Take 17 g by mouth daily as needed.  . potassium chloride SA (KLOR-CON) 20 MEQ tablet Take 20 mEq by mouth as needed. When hands cramp up  . Respiratory Therapy Supplies (FLUTTER) DEVI Use three times a day after inhaler or nebulizer use  . rosuvastatin (CRESTOR) 40 MG tablet Take 1 tablet (40 mg total) by mouth daily.  . TRUEplus Lancets 28G MISC Use as directed  . valsartan (DIOVAN) 320 MG tablet Take 1 tablet (320 mg total) by mouth daily.      Allergies:   Amoxicillin, Lisinopril, Molds & smuts, Robitussin [guaifenesin], and Azithromycin   Social History   Socioeconomic History  . Marital status: Single    Spouse name: Not on file  . Number of children: 2  . Years of education: 12 grade  . Highest education level: Not on file  Occupational History  . 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 Smoking status: Current Every Day Smoker    Packs/day: 0.50    Years: 46.00    Pack years: 23.00    Types: Cigarettes    Start date: 129 . Smokeless tobacco: Never Used  . Tobacco comment: still smoking 0.5 ppd  Vaping Use  . Vaping Use: Never used  Substance and Sexual Activity  . Alcohol use: Yes  Alcohol/week: 3.0 standard drinks    Types: 3 Cans of beer per week    Comment: rarely  . Drug use: No  . Sexual activity: Not Currently  Other Topics Concern  . Not on file  Social History Narrative  . Not on file   Social Determinants of Health   Financial Resource Strain: Not on file  Food Insecurity: Not on file  Transportation Needs: Not on file  Physical Activity: Not on file  Stress: No Stress Concern Present  . Feeling of Stress : Only a little  Social Connections: Not on file     Family History: The patient's family history includes Allergies in his brother; Asthma in his mother; Colon cancer in his father; Diabetes in his maternal grandmother; Lung cancer in his father and mother; Prostate cancer in his father. There is no history of Colon polyps, Esophageal cancer, Stomach cancer, or Rectal cancer.  ROS:   Please see the history of present illness.     All other systems reviewed and are negative.  EKGs/Labs/Other Studies Reviewed:    The following studies were reviewed today:   EKG:  EKG is not ordered today.  The ekg ordered most recently demonstrates normal sinus rhythm, PVCs, no ST abnormalities  Recent Labs: 02/26/2020: Hemoglobin 16.4; Platelets 387.0 04/14/2020: ALT  19; Magnesium 2.0 08/19/2020: BUN 17; Creatinine, Ser 1.17; Potassium 4.0; Sodium 143  Recent Lipid Panel    Component Value Date/Time   CHOL 278 (H) 11/15/2019 0935   TRIG 370 (H) 11/15/2019 0935   HDL 53 11/15/2019 0935   CHOLHDL 5.2 (H) 11/15/2019 0935   LDLCALC 156 (H) 11/15/2019 0935    Physical Exam:    VS:  BP 130/78   Pulse 96   Ht '5\' 9"'  (1.753 m)   Wt 238 lb (108 kg)   SpO2 96%   BMI 35.15 kg/m     Wt Readings from Last 3 Encounters:  09/17/20 238 lb (108 kg)  09/11/20 237 lb (107.5 kg)  07/28/20 231 lb 6.4 oz (105 kg)     GEN:  Well nourished, well developed in no acute distress HEENT: Normal NECK: No JVD; No carotid bruits LYMPHATICS: No lymphadenopathy CARDIAC: RRR, irregular, no murmurs, rubs, gallops RESPIRATORY:  Clear to auscultation without rales, wheezing or rhonchi  ABDOMEN: Soft, non-tender, non-distended MUSCULOSKELETAL:  No edema; No deformity  SKIN: Warm and dry NEUROLOGIC:  Alert and oriented x 3 PSYCHIATRIC:  Normal affect   ASSESSMENT:    No diagnosis found. PLAN:    HCM: Cardiac MRI 06/23/2020 showed asymmetric hypertrophy measuring up to 20 mm and basal anterior septum (8 mm and posterior wall), consistent with hypertrophic cardiomyopathy.  Also with patchy LGE in basal septum and RV insertion sites consistent with HCM; LGE accounts for 11% of total myocardial mass.  Normal LV/RV size and systolic function.  Echocardiogram on 03/18/2020 showed mild LVOT obstruction, peak gradient 13 mmHg. -Recommended first-degree relatives be screened for HCM: He reports he will speak to his daughters and brother/sisters.  Discussed genetic testing but he requests to hold off at this time -Recommend switching from diuretic to alternative antihypertensive agent, as suspecting diuretic worsens his LVOT obstruction.  He is now off triamterene-HCTZ.  On amlodipine and valsartan for hypertension -Continue nebivolol 20 mg BID -Short episodes of NSVT (longest lasting  3 beats) on cardiac monitoring.  Given HCM with age greater than 52, ICD not indicated  Chest pain/DOE: Atypical in description but does have CAD risk factors (hypertension, hyperlipidemia,  T2DM).   Lexiscan Myoview on 04/29/2020 showed normal perfusion, EF 57%.    PVCs: [employer] monitor x3 days on 05/06/2020 showed 7 episodes of NSVT, longest lasting 3 beats, occasional PVCs (3.9% of beats).  Leg pain: Normal ABIs on 04/23/2019  Hypertension: On amlodipine 10 mg daily, valsartan 161 mg, Bystolic 20 mg daily.  Appears controlled.  Will check BMP  Tobacco use: Patient counseled on the risk of tobacco use and cessation strongly encouraged.  Will refer to our care guide to work with patient to assist in smoking cessation  Hyperlipidemia: On rosuvastatin 20 mg daily, LDL 156 on 11/15/19.  Calcium score 117 on 04/29/2020 (81st percentile).  Rosuvastatin increased to 40 mg daily.  Recheck lipid panel  T2DM: On Metformin.  A1c 6.5% on 11/15/2019, will recheck A1c  Daytime somnolence: Screen for OSA with home sleep study  RTC in 6 months    Medication Adjustments/Labs and Tests Ordered: Current medicines are reviewed at length with the patient today.  Concerns regarding medicines are outlined above.  No orders of the defined types were placed in this encounter.  No orders of the defined types were placed in this encounter.   There are no Patient Instructions on file for this visit.   Signed, Donato Heinz, MD  09/17/2020 8:13 AM    [place]

## 2020-09-17 NOTE — Progress Notes (Signed)
Cardiology Office Note:    Date:  09/18/2020   ID:  Joseph Harvey, DOB 1955/10/09, MRN [id number]  PCP:  Ladell Pier, MD  Cardiologist:  None  Electrophysiologist:  None   Referring MD: Ladell Pier, MD   Chief Complaint  Patient presents with  . Cardiomyopathy    History of Present Illness:    Joseph Harvey is a 65 y.o. male with a hx of prostate cancer in remission, T2DM, hypertension, hyperlipidemia, toabbaco use who presents for follow-up.  He was referred by Dr. Wynetta Emery for evaluation of dyspnea on exertion, intially seen on 04/14/20.  Hospital he reports that he has been having chest pain with minimal exertion.  Also describes having chest pain.  Reports sharp left-sided chest pain, can occur at rest or with exertion.  Typically last for 10 minutes or so.  Also states he has been having occasional lightheadedness, denies any syncope.  Denies any lower extremity edema.  Reports BP has been anywhere from 110s to 160s when he checks at home.  Reports he had a prior test for sleep apnea which was negative.  He has smoked since age 63, less than 1 pack/day.  No known history of heart disease in his immediate family.  Echocardiogram on 03/18/2020 showed severe septal hypertrophy with otherwise mild concentric hypertrophy, mild intracavitary gradient (peak gradient 30 mmHg), normal biventricular function, grade 1 diastolic dysfunction, no significant valvular disease.  Lexiscan Myoview on 04/29/2020 showed normal perfusion, EF 57%.  Preventives monitor x3 days on 05/06/2020 showed 7 episodes of NSVT, longest lasting 3 beats, occasional PVCs (3.9% of beats).  Calcium score 117 on 04/29/2020 (81st percentile).  Cardiac MRI 06/23/2020 showed asymmetric hypertrophy measuring up to 20 mm and basal anterior septum (8 mm and posterior wall), consistent with hypertrophic cardiomyopathy.  Also with patchy LGE in basal septum and RV insertion sites consistent with HCM; LGE accounts for 11% of total  myocardial mass.  Normal LV/RV size and systolic function.  Since last clinic visit, he tried carvedilol, but did not tolerate and is back on nebivolol. Today, his breathing felt "funny" yesterday, and is unsure if this is due toallergies. He also has some shortness of breath while exercising for 5-10 minutes or when lifting heavy objects. He is fearful of exercising or exerting because of his shortness of breath.  No recent chest pain.  However, he has some lightheadedness. He has some LE edema, which is controlled by wearing compression socks and propping his legs up on the bed. His blood pressure at home has averaged around 130-140. He continues to smoke about 1/2 ppd, and he states quitting is the most difficult thing for him to do now. A few years ago he was tested for sleep apnea. He is willing to take another sleep test, but would prefer it be at home. When he snores, it is loud enough to wake him at night. He denies any chest pains, palpitations, or syncope.    BP Readings from Last 3 Encounters:  09/17/20 130/78  09/11/20 (!) 146/84  08/19/20 138/72    Past Medical History:  Diagnosis Date  . Allergy    Dust, mold, dust mites  . Anemia   . Asthma   . Cancer [facility])    prostate  . Cataract    bilateral repair.  . Diabetes mellitus without complication ([place])   . Glaucoma   . Hyperlipidemia   . Hypertension   . Neuromuscular disorder ([place])    nerve damage from back  surgery  . Pneumonia     Past Surgical History:  Procedure Laterality Date  . BACK SURGERY  2017  . CATARACT EXTRACTION Bilateral   . COLONOSCOPY     2013  . COLONOSCOPY  10/03/2019  . KNEE ARTHROSCOPY  2005   right  . PROSTATE SURGERY    . ROBOT ASSISTED LAPAROSCOPIC RADICAL PROSTATECTOMY  03/2010    Current Medications: Current Meds  Medication Sig  . albuterol (PROVENTIL) (2.5 MG/3ML) 0.083% nebulizer solution TAKE 3 MLS (2.5 MG TOTAL) BY NEBULIZATION EVERY 4 (FOUR) HOURS AS NEEDED FOR WHEEZING OR  SHORTNESS OF BREATH (((PLAN B))).  Marland Kitchen albuterol (VENTOLIN HFA) 108 (90 Base) MCG/ACT inhaler INHALE 2 PUFFS INTO THE LUNGS EVERY 4 (FOUR) HOURS AS NEEDED FOR WHEEZING OR SHORTNESS OF BREATH.  Marland Kitchen amLODipine (NORVASC) 5 MG tablet TAKE 1 TABLET (5 MG TOTAL) BY MOUTH 2 (TWO) TIMES DAILY.  Marland Kitchen ASPIRIN 81 PO Take 81 mg by mouth daily.  . baclofen (LIORESAL) 10 MG tablet TAKE 1 TABLET BY ORAL ROUTE 3 TIMES EVERY DAY  . Blood Glucose Monitoring Suppl (TRUE METRIX METER) w/Device KIT Use as directed  . Budeson-Glycopyrrol-Formoterol (BREZTRI AEROSPHERE) 160-9-4.8 MCG/ACT AERO Inhale 2 puffs into the lungs in the morning and at bedtime.  . Budeson-Glycopyrrol-Formoterol (BREZTRI AEROSPHERE) 160-9-4.8 MCG/ACT AERO Inhale 2 puffs into the lungs in the morning and at bedtime.  Marland Kitchen buPROPion (WELLBUTRIN XL) 150 MG 24 hr tablet TAKE 1 TABLET (150 MG TOTAL) BY MOUTH DAILY.  . busPIRone (BUSPAR) 10 MG tablet Take 1 tablet (10 mg total) by mouth 3 (three) times daily.  . Cholecalciferol (VITAMIN D PO) Take 1 tablet by mouth daily.  . fluticasone (FLONASE) 50 MCG/ACT nasal spray PLACE 2 SPRAYS INTO BOTH NOSTRILS DAILY.  Marland Kitchen gabapentin (NEURONTIN) 300 MG capsule TAKE 1 CAPSULE IN THE MORNING,TAKE 1 CAP AT LUNCH AND TAKE 2 CAPS AT BEDTIME  . glucose blood (TRUE METRIX BLOOD GLUCOSE TEST) test strip Use as instructed  . ipratropium-albuterol (DUONEB) 0.5-2.5 (3) MG/3ML SOLN USE VIAL BY NEBULIZATION 2 (TWO) TIMES DAILY.  Marland Kitchen LORazepam (ATIVAN) 1 MG tablet Take 1 tablet (18m) before cardiac MRI  . metFORMIN (GLUCOPHAGE-XR) 500 MG 24 hr tablet TAKE 1 TABLET BY MOUTH DAILY.  . montelukast (SINGULAIR) 10 MG tablet TAKE 1 TABLET BY MOUTH AT BEDTIME  . Nebivolol HCl 20 MG TABS Take 1 tablet (20 mg total) by mouth 2 (two) times daily.  . pantoprazole (PROTONIX) 40 MG tablet TAKE 1 TABLET (40 MG TOTAL) BY MOUTH DAILY.  .Marland Kitchenpolyethylene glycol powder (GLYCOLAX/MIRALAX) 17 GM/SCOOP powder Take 17 g by mouth daily as needed.  . potassium  chloride SA (KLOR-CON) 20 MEQ tablet Take 20 mEq by mouth as needed. When hands cramp up  . Respiratory Therapy Supplies (FLUTTER) DEVI Use three times a day after inhaler or nebulizer use  . rosuvastatin (CRESTOR) 40 MG tablet Take 1 tablet (40 mg total) by mouth daily.  . TRUEplus Lancets 28G MISC Use as directed  . valsartan (DIOVAN) 320 MG tablet Take 1 tablet (320 mg total) by mouth daily.     Allergies:   Amoxicillin, Lisinopril, Molds & smuts, Robitussin [guaifenesin], and Azithromycin   Social History   Socioeconomic History  . Marital status: Single    Spouse name: Not on file  . Number of children: 2  . Years of education: 12 grade  . Highest education level: Not on file  Occupational History  . 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 Smoking status: Current Every Day Smoker    Packs/day: 0.50    Years: 46.00    Pack years: 23.00    Types: Cigarettes    Start date: 16  . Smokeless tobacco: Never Used  . Tobacco comment: still smoking 0.5 ppd  Vaping Use  . Vaping Use: Never used  Substance and Sexual Activity  . Alcohol use: Yes    Alcohol/week: 3.0 standard drinks    Types: 3 Cans of beer per week    Comment: rarely  . Drug use: No  . Sexual activity: Not Currently  Other Topics Concern  . Not on file  Social History Narrative  . Not on file   Social Determinants of Health   Financial Resource Strain: Not on file  Food Insecurity: Not on file  Transportation Needs: Not on file  Physical Activity: Not on file  Stress: No Stress Concern Present  . Feeling of Stress : Only a little  Social Connections: Not on file     Family History: The patient's family history includes Allergies in his brother; Asthma in his mother; Colon cancer in his father; Diabetes in his maternal grandmother; Lung cancer in his father and mother; Prostate cancer in his father. There is no history of Colon polyps, Esophageal cancer, Stomach cancer, or  Rectal cancer.  ROS:   Please see the history of present illness.    (+) Shortness of breath (+) Lightheadedness (+) LE edema (+) Snoring All other systems reviewed and are negative.  EKGs/Labs/Other Studies Reviewed:    The following studies were reviewed today:   EKG:   04/14/20: normal sinus rhythm, PVCs, no ST abnormalities 05/18/20: EKG was not ordered. 07/14/20: EKG was not ordered. 09/17/20: Sinus rhythm. Rate 96 bpm PVCs, No ST abnormalities.  Recent Labs: 02/26/2020: Hemoglobin 16.4; Platelets 387.0 04/14/2020: ALT 19; Magnesium 2.0 09/17/2020: BUN 17; Creatinine, Ser 1.32; Potassium 4.9; Sodium 140  Recent Lipid Panel    Component Value Date/Time   CHOL 190 09/17/2020 0844   Joseph 137 09/17/2020 0844   HDL 63 09/17/2020 0844   CHOLHDL 3.0 09/17/2020 0844   LDLCALC 103 (H) 09/17/2020 0844    Physical Exam:    VS:  BP 130/78   Pulse 96   Ht '5\' 9"'  (1.753 m)   Wt 238 lb (108 kg)   SpO2 96%   BMI 35.15 kg/m     Wt Readings from Last 3 Encounters:  09/17/20 238 lb (108 kg)  09/11/20 237 lb (107.5 kg)  07/28/20 231 lb 6.4 oz (105 kg)     GEN:  Well nourished, well developed in no acute distress HEENT: Normal NECK: No JVD; No carotid bruits LYMPHATICS: No lymphadenopathy CARDIAC: Irregular, no murmurs, rubs, gallops RESPIRATORY:  Clear to auscultation without rales, wheezing or rhonchi  ABDOMEN: Soft, non-tender, non-distended MUSCULOSKELETAL:  Trace bilateral edema; No deformity  SKIN: Warm and dry NEUROLOGIC:  Alert and oriented x 3 PSYCHIATRIC:  Normal affect   ASSESSMENT:    1. Hypertrophic cardiomyopathy ([place])   2. PVC's (premature ventricular contractions)   3. Essential hypertension   4. Daytime somnolence   5. Type 2 diabetes mellitus with obesity ([place])   6. Hyperlipidemia, unspecified hyperlipidemia type   7. DOE (dyspnea on exertion)    PLAN:    HCM: Cardiac MRI 06/23/2020 showed asymmetric hypertrophy measuring up to 20 mm and basal  anterior septum (8 mm and posterior wall), consistent with hypertrophic cardiomyopathy.  Also with patchy LGE in  basal septum and RV insertion sites consistent with HCM; LGE accounts for 11% of total myocardial mass.  Normal LV/RV size and systolic function.  Echocardiogram on 03/18/2020 showed mild LVOT obstruction, peak gradient 13 mmHg. -Recommended first-degree relatives be screened for HCM: He reports he will speak to his daughters and brother/sisters.  Discussed genetic testing but he requests to hold off at this time -Recommend switching from diuretic to alternative antihypertensive agent, as suspecting diuretic worsens his LVOT obstruction.  He is now off triamterene-HCTZ.  On amlodipine and valsartan for hypertension -Continue nebivolol 20 mg BID -Short episodes of NSVT (longest lasting 3 beats) on cardiac monitoring.  Given HCM with age greater than 62, ICD not indicated  Chest pain/DOE: Atypical in description but does have CAD risk factors (hypertension, hyperlipidemia, T2DM).   Lexiscan Myoview on 04/29/2020 showed normal perfusion, EF 57%.    PVCs: [employer] monitor x3 days on 05/06/2020 showed 7 episodes of NSVT, longest lasting 3 beats, occasional PVCs (3.9% of beats).  Leg pain: Normal ABIs on 04/23/2019  Hypertension: On amlodipine 10 mg daily, valsartan 384 mg, Bystolic 20 mg daily.  Appears controlled.  Will check BMP  Tobacco use: Patient counseled on the risk of tobacco use and cessation strongly encouraged.  Will refer to our care guide to work with patient to assist in smoking cessation  Hyperlipidemia: On rosuvastatin 20 mg daily, LDL 156 on 11/15/19.  Calcium score 117 on 04/29/2020 (81st percentile).  Rosuvastatin increased to 40 mg daily.  Recheck lipid panel  T2DM: On Metformin.  A1c 6.5% on 11/15/2019, will recheck A1c  Daytime somnolence: Screen for OSA with home sleep study  RTC in 6 months    Medication Adjustments/Labs and Tests Ordered: Current medicines are  reviewed at length with the patient today.  Concerns regarding medicines are outlined above.  Orders Placed This Encounter  Procedures  . Basic metabolic panel  . Lipid panel  . Hemoglobin A1c  . EKG 12-Lead  . Home sleep test   No orders of the defined types were placed in this encounter.   Patient Instructions  Medication Instructions:  Your physician recommends that you continue on your current medications as directed. Please refer to the Current Medication list given to you today.  *If you need a refill on your cardiac medications before your next appointment, please call your pharmacy*   Lab Work: BMET, Lipid, HmgA1C  If you have labs (blood work) drawn today and your tests are completely normal, you will receive your results only by: Marland Kitchen MyChart Message (if you have MyChart) OR . A paper copy in the mail If you have any lab test that is abnormal or we need to change your treatment, we will call you to review the results.   Testing/Procedures: Home sleep study-we will call to schedule this once approved by insurance  Follow-Up: At [facility], you and your health needs are our priority.  As part of our continuing mission to provide you with exceptional heart care, we have created designated Provider Care Teams.  These Care Teams include your primary Cardiologist (physician) and Advanced Practice Providers (APPs -  Physician Assistants and Nurse Practitioners) who all work together to provide you with the care you need, when you need it.  We recommend signing up for the patient portal called "MyChart".  Sign up information is provided on this After Visit Summary.  MyChart is used to connect with patients for Virtual Visits (Telemedicine).  Patients are able to view lab/test results,  encounter notes, upcoming appointments, etc.  Non-urgent messages can be sent to your provider as well.   To learn more about what you can do with MyChart, go to [email or web address].    Your  next appointment:   6 month(s)  The format for your next appointment:   In Person  Provider:   Oswaldo Milian, MD          [facility],acting as a scribe for Donato Heinz, MD.,have documented all relevant documentation on the behalf of Donato Heinz, MD,as directed by  Donato Heinz, MD while in the presence of Donato Heinz, MD.  I, Donato Heinz, MD, have reviewed all documentation for this visit. The documentation on 09/18/20 for the exam, diagnosis, procedures, and orders are all accurate and complete.   Signed, Donato Heinz, MD  09/18/2020 9:00 AM    [place]

## 2020-09-17 NOTE — Patient Instructions (Signed)
Medication Instructions:  Your physician recommends that you continue on your current medications as directed. Please refer to the Current Medication list given to you today.  *If you need a refill on your cardiac medications before your next appointment, please call your pharmacy*   Lab Work: BMET, Lipid, HmgA1C  If you have labs (blood work) drawn today and your tests are completely normal, you will receive your results only by: Marland Kitchen MyChart Message (if you have MyChart) OR . A paper copy in the mail If you have any lab test that is abnormal or we need to change your treatment, we will call you to review the results.   Testing/Procedures: Home sleep study-we will call to schedule this once approved by insurance  Follow-Up: At [facility], you and your health needs are our priority.  As part of our continuing mission to provide you with exceptional heart care, we have created designated Provider Care Teams.  These Care Teams include your primary Cardiologist (physician) and Advanced Practice Providers (APPs -  Physician Assistants and Nurse Practitioners) who all work together to provide you with the care you need, when you need it.  We recommend signing up for the patient portal called "MyChart".  Sign up information is provided on this After Visit Summary.  MyChart is used to connect with patients for Virtual Visits (Telemedicine).  Patients are able to view lab/test results, encounter notes, upcoming appointments, etc.  Non-urgent messages can be sent to your provider as well.   To learn more about what you can do with MyChart, go to [email or web address].    Your next appointment:   6 month(s)  The format for your next appointment:   In Person  Provider:   Oswaldo Milian, MD

## 2020-09-18 ENCOUNTER — Other Ambulatory Visit: Payer: Self-pay

## 2020-09-18 ENCOUNTER — Other Ambulatory Visit: Payer: Self-pay | Admitting: Internal Medicine

## 2020-09-18 ENCOUNTER — Telehealth: Payer: Self-pay | Admitting: *Deleted

## 2020-09-18 DIAGNOSIS — E1169 Type 2 diabetes mellitus with other specified complication: Secondary | ICD-10-CM

## 2020-09-18 LAB — BASIC METABOLIC PANEL
BUN/Creatinine Ratio: 13 (ref 10–24)
BUN: 17 mg/dL (ref 8–27)
CO2: 21 mmol/L (ref 20–29)
Calcium: 10 mg/dL (ref 8.6–10.2)
Chloride: 101 mmol/L (ref 96–106)
Creatinine, Ser: 1.32 mg/dL — ABNORMAL HIGH (ref 0.76–1.27)
Glucose: 172 mg/dL — ABNORMAL HIGH (ref 65–99)
Potassium: 4.9 mmol/L (ref 3.5–5.2)
Sodium: 140 mmol/L (ref 134–144)
eGFR: 61 mL/min/{1.73_m2} (ref >59)

## 2020-09-18 LAB — LIPID PANEL
Chol/HDL Ratio: 3 ratio (ref 0.0–5.0)
Cholesterol, Total: 190 mg/dL (ref 100–199)
HDL: 63 mg/dL (ref >39)
LDL Chol Calc (NIH): 103 mg/dL — ABNORMAL HIGH (ref 0–99)
Triglycerides: 137 mg/dL (ref 0–149)
VLDL Cholesterol Cal: 24 mg/dL (ref 5–40)

## 2020-09-18 LAB — HEMOGLOBIN A1C
Est. average glucose Bld gHb Est-mCnc: 146 mg/dL
Hgb A1c MFr Bld: 6.7 % — ABNORMAL HIGH (ref 4.8–5.6)

## 2020-09-18 MED ORDER — ROSUVASTATIN CALCIUM 40 MG PO TABS
40.0000 mg | ORAL_TABLET | Freq: Every day | ORAL | 3 refills | Status: DC
  Filled 2020-09-18: qty 90, 90d supply, fill #0
  Filled 2021-01-08: qty 90, 90d supply, fill #1
  Filled 2021-04-04: qty 90, 90d supply, fill #2
  Filled 2021-07-11: qty 90, 90d supply, fill #3
  Filled 2021-07-12 (×2): qty 30, 30d supply, fill #0
  Filled 2021-08-12: qty 30, 30d supply, fill #1
  Filled 2021-09-05: qty 30, 30d supply, fill #2

## 2020-09-18 MED ORDER — VALSARTAN 320 MG PO TABS
320.0000 mg | ORAL_TABLET | Freq: Every day | ORAL | 3 refills | Status: DC
  Filled 2020-09-18: qty 90, 90d supply, fill #0
  Filled 2020-12-11: qty 90, 90d supply, fill #1
  Filled 2021-02-17 – 2021-02-24 (×4): qty 90, 90d supply, fill #2
  Filled 2021-06-08: qty 30, 30d supply, fill #0
  Filled 2021-06-08: qty 90, 90d supply, fill #3
  Filled 2021-07-11 – 2021-07-12 (×2): qty 30, 30d supply, fill #0
  Filled 2021-08-12: qty 30, 30d supply, fill #1

## 2020-09-18 MED FILL — Montelukast Sodium Tab 10 MG (Base Equiv): ORAL | 30 days supply | Qty: 30 | Fill #0 | Status: AC

## 2020-09-18 NOTE — Telephone Encounter (Signed)
-----   Message from Silverio Lay, RN sent at 09/17/2020  8:36 AM EDT ----- Regarding: HST HST ordered per Dr. Gardiner Rhyme  Thanks!

## 2020-09-18 NOTE — Telephone Encounter (Signed)
Prior Authorization for Home sleep test sent to [facility] via web portal. Tracking Number [id number].

## 2020-09-19 ENCOUNTER — Other Ambulatory Visit: Payer: Self-pay

## 2020-09-19 MED ORDER — METFORMIN HCL ER 500 MG PO TB24
ORAL_TABLET | Freq: Every day | ORAL | 0 refills | Status: DC
  Filled 2020-09-19: qty 90, 90d supply, fill #0

## 2020-09-19 NOTE — Telephone Encounter (Signed)
Requested Prescriptions  Pending Prescriptions Disp Refills  . metFORMIN (GLUCOPHAGE-XR) 500 MG 24 hr tablet 90 tablet 0    Sig: TAKE 1 TABLET BY MOUTH DAILY.     Endocrinology:  Diabetes - Biguanides Failed - 09/18/2020  9:29 PM      Failed - Cr in normal range and within 360 days    Creatinine, Ser  Date Value Ref Range Status  09/17/2020 1.32 (H) 0.76 - 1.27 mg/dL Final         Passed - HBA1C is between 0 and 7.9 and within 180 days    HbA1c, POC (prediabetic range)  Date Value Ref Range Status  07/19/2018 6.0 5.7 - 6.4 % Final   HbA1c, POC (controlled diabetic range)  Date Value Ref Range Status  11/15/2019 6.5 0.0 - 7.0 % Final   Hgb A1c MFr Bld  Date Value Ref Range Status  09/17/2020 6.7 (H) 4.8 - 5.6 % Final    Comment:             Prediabetes: 5.7 - 6.4          Diabetes: >6.4          Glycemic control for adults with diabetes: <7.0          Passed - AA eGFR in normal range and within 360 days    GFR calc Af Amer  Date Value Ref Range Status  04/14/2020 68 >59 mL/min/1.73 Final    Comment:    **In accordance with recommendations from the NKF-ASN Task force,**   [facility] is in the process of updating its eGFR calculation to the   2021 CKD-EPI creatinine equation that estimates kidney function   without a race variable.    GFR calc non Af Amer  Date Value Ref Range Status  04/14/2020 59 (L) >59 mL/min/1.73 Final   eGFR  Date Value Ref Range Status  09/17/2020 61 >59 mL/min/1.73 Final         Passed - Valid encounter within last 6 months    Recent Outpatient Visits          2 weeks ago Hypertension associated with type 2 diabetes mellitus ([place])   [place] Karle Plumber B, MD   6 months ago Type 2 diabetes mellitus with obesity [facility])   [place], MD   8 months ago Essential hypertension   [place], Jarome Matin, RPH-CPP   9  months ago Panic disorder   [place], MD   9 months ago Essential hypertension   [place], RPH-CPP

## 2020-09-21 ENCOUNTER — Other Ambulatory Visit: Payer: Self-pay

## 2020-09-21 ENCOUNTER — Other Ambulatory Visit: Payer: Self-pay | Admitting: Internal Medicine

## 2020-09-21 MED ORDER — ALBUTEROL SULFATE HFA 108 (90 BASE) MCG/ACT IN AERS
2.0000 | INHALATION_SPRAY | RESPIRATORY_TRACT | 0 refills | Status: DC | PRN
  Filled 2020-09-21: qty 18, 16d supply, fill #0

## 2020-09-21 NOTE — Telephone Encounter (Signed)
Patient notified of 11/06/20 HST appointment. [employer] Auth received. Auth # [id number]. Valid dates 11/02/20 to 02/02/21.

## 2020-09-21 NOTE — Telephone Encounter (Signed)
Requested Prescriptions  Pending Prescriptions Disp Refills  . albuterol (VENTOLIN HFA) 108 (90 Base) MCG/ACT inhaler 18 g 0    Sig: INHALE 2 PUFFS INTO THE LUNGS EVERY 4 (FOUR) HOURS AS NEEDED FOR WHEEZING OR SHORTNESS OF BREATH.     Pulmonology:  Beta Agonists Failed - 09/21/2020  8:23 AM      Failed - One inhaler should last at least one month. If the patient is requesting refills earlier, contact the patient to check for uncontrolled symptoms.      Passed - Valid encounter within last 12 months    Recent Outpatient Visits          2 weeks ago Hypertension associated with type 2 diabetes mellitus [facility])   [place] Karle Plumber B, MD   6 months ago Type 2 diabetes mellitus with obesity [facility])   [place], MD   8 months ago Essential hypertension   [place], Jarome Matin, RPH-CPP   9 months ago Panic disorder   [place], MD   9 months ago Essential hypertension   [place], RPH-CPP

## 2020-09-22 ENCOUNTER — Other Ambulatory Visit: Payer: Self-pay

## 2020-09-22 ENCOUNTER — Other Ambulatory Visit: Payer: Self-pay | Admitting: *Deleted

## 2020-09-22 MED ORDER — EZETIMIBE 10 MG PO TABS
10.0000 mg | ORAL_TABLET | Freq: Every day | ORAL | 3 refills | Status: DC
  Filled 2020-09-22: qty 90, 90d supply, fill #0
  Filled 2021-01-08: qty 90, 90d supply, fill #1
  Filled 2021-04-04: qty 90, 90d supply, fill #2
  Filled 2021-07-11: qty 90, 90d supply, fill #3
  Filled 2021-07-12: qty 30, 30d supply, fill #0
  Filled 2021-08-12: qty 30, 30d supply, fill #1
  Filled 2021-09-05: qty 30, 30d supply, fill #2

## 2020-10-14 ENCOUNTER — Other Ambulatory Visit: Payer: Self-pay | Admitting: Internal Medicine

## 2020-10-14 ENCOUNTER — Other Ambulatory Visit: Payer: Self-pay

## 2020-10-14 ENCOUNTER — Encounter: Payer: Self-pay | Admitting: Internal Medicine

## 2020-10-15 ENCOUNTER — Other Ambulatory Visit: Payer: Self-pay

## 2020-10-16 ENCOUNTER — Encounter (HOSPITAL_BASED_OUTPATIENT_CLINIC_OR_DEPARTMENT_OTHER): Payer: 59 | Admitting: Internal Medicine

## 2020-10-19 ENCOUNTER — Other Ambulatory Visit: Payer: Self-pay

## 2020-11-03 ENCOUNTER — Other Ambulatory Visit: Payer: Self-pay

## 2020-11-03 MED FILL — Gabapentin Cap 300 MG: ORAL | 30 days supply | Qty: 120 | Fill #1 | Status: AC

## 2020-11-03 MED FILL — Baclofen Tab 10 MG: ORAL | 30 days supply | Qty: 90 | Fill #1 | Status: AC

## 2020-11-05 LAB — HM DIABETES EYE EXAM

## 2020-11-06 ENCOUNTER — Other Ambulatory Visit: Payer: Self-pay

## 2020-11-06 ENCOUNTER — Ambulatory Visit (HOSPITAL_BASED_OUTPATIENT_CLINIC_OR_DEPARTMENT_OTHER): Payer: 59 | Attending: Cardiology | Admitting: Cardiovascular Disease

## 2020-11-06 DIAGNOSIS — R400 Somnolence: Secondary | ICD-10-CM | POA: Diagnosis not present

## 2020-11-06 DIAGNOSIS — R0902 Hypoxemia: Secondary | ICD-10-CM | POA: Diagnosis not present

## 2020-11-06 DIAGNOSIS — G4733 Obstructive sleep apnea (adult) (pediatric): Secondary | ICD-10-CM | POA: Diagnosis present

## 2020-11-12 ENCOUNTER — Encounter (HOSPITAL_BASED_OUTPATIENT_CLINIC_OR_DEPARTMENT_OTHER): Payer: Self-pay | Admitting: Cardiovascular Disease

## 2020-11-12 NOTE — Procedures (Signed)
     Patient Name: Joseph Harvey, [place] Date: 11/07/2020 Gender: Male D.O.B: 1956/05/08 Age (years): 65 Referring Provider: Oswaldo Milian Height (inches): 87 Interpreting Physician: Shelva Majestic MD, ABSM Weight (lbs): 238 RPSGT: Jacolyn Reedy BMI: 35 MRN: [id number] Neck Size: 19.00  CLINICAL INFORMATION Sleep Study Type: HST  Indication for sleep study: snoring, obesity,   Epworth Sleepiness Score: 5  SLEEP STUDY TECHNIQUE A multi-channel overnight portable sleep study was performed. The channels recorded were: nasal airflow, thoracic respiratory movement, and oxygen saturation with a pulse oximetry. Snoring was also monitored.  MEDICATIONS ezetimibe (ZETIA) 10 MG tablet albuterol (PROVENTIL) (2.5 MG/3ML) 0.083% nebulizer solution albuterol (VENTOLIN HFA) 108 (90 Base) MCG/ACT inhaler amLODipine (NORVASC) 5 MG tablet ASPIRIN 81 PO baclofen (LIORESAL) 10 MG tablet Blood Glucose Monitoring Suppl (TRUE METRIX METER) w/Device KIT Budeson-Glycopyrrol-Formoterol (BREZTRI AEROSPHERE) 160-9-4.8 MCG/ACT AERO Budeson-Glycopyrrol-Formoterol (BREZTRI AEROSPHERE) 160-9-4.8 MCG/ACT AERO buPROPion (WELLBUTRIN XL) 150 MG 24 hr tablet busPIRone (BUSPAR) 10 MG tablet Cholecalciferol (VITAMIN D PO) fluticasone (FLONASE) 50 MCG/ACT nasal spray gabapentin (NEURONTIN) 300 MG capsule glucose blood (TRUE METRIX BLOOD GLUCOSE TEST) test strip ipratropium-albuterol (DUONEB) 0.5-2.5 (3) MG/3ML SOLN LORazepam (ATIVAN) 1 MG tablet metFORMIN (GLUCOPHAGE-XR) 500 MG 24 hr tablet montelukast (SINGULAIR) 10 MG tablet Nebivolol HCl 20 MG TABS pantoprazole (PROTONIX) 40 MG tablet polyethylene glycol powder (GLYCOLAX/MIRALAX) 17 GM/SCOOP powder potassium chloride SA (KLOR-CON) 20 MEQ tablet Respiratory Therapy Supplies (FLUTTER) DEVI rosuvastatin (CRESTOR) 40 MG tablet TRUEplus Lancets 28G MISC valsartan (DIOVAN) 320 MG tablet Patient self administered medications include:  N/A.  SLEEP ARCHITECTURE Patient was studied for 453.3 minutes. The sleep efficiency was 99.9 % and the patient was supine for 0%. The arousal index was 0.0 per hour.  RESPIRATORY PARAMETERS The overall AHI was 37.6 per hour, with a central apnea index of 0 per hour. Supine sleep was absent.  The oxygen nadir was 74% during sleep.  CARDIAC DATA Mean heart rate during sleep was 78.3 bpm.  IMPRESSIONS - Severe obstructive sleep apnea occurred during this study (AHI 37.6/h).  - Severe oxygen desaturation to a nadir of 74%. - Patient snored 35.7% during the sleep.  DIAGNOSIS - Obstructive Sleep Apnea (G47.33) - Nocturnal Hypoxemia (G47.36)  RECOMMENDATIONS - In this patient with significant cardiovascular co-morbidities with severe sleep apnea and nocturnal oxygen desaturation recommend an in-lab CPAP titration study. - Effort should be made to optimize nasal and oropharyngeal patency. - Avoid alcohol, sedatives and other CNS depressants that may worsen sleep apnea and disrupt normal sleep architecture. - Sleep hygiene should be reviewed to assess factors that may improve sleep quality. - Weight management and regular exercise should be initiated or continued. - Recommend a download after 30 days and sleep clinic evaluation after one month of therapy.   [Electronically signed] 11/12/2020 07:45 AM  Shelva Majestic MD, [facility], ABSM Diplomate, American Board of Sleep Medicine   NPI: [id number] ELECTRONICALLY SIGNED ON:  11/12/2020, 7:45 AM [place] PH: [phone]   FX: [phone] [place]

## 2020-11-16 ENCOUNTER — Other Ambulatory Visit: Payer: Self-pay | Admitting: Internal Medicine

## 2020-11-19 ENCOUNTER — Other Ambulatory Visit: Payer: Self-pay | Admitting: Critical Care Medicine

## 2020-11-19 ENCOUNTER — Other Ambulatory Visit: Payer: Self-pay | Admitting: Internal Medicine

## 2020-11-19 ENCOUNTER — Telehealth: Payer: Self-pay | Admitting: Cardiology

## 2020-11-19 ENCOUNTER — Other Ambulatory Visit: Payer: Self-pay

## 2020-11-19 MED ORDER — ALBUTEROL SULFATE (2.5 MG/3ML) 0.083% IN NEBU
2.5000 mg | INHALATION_SOLUTION | RESPIRATORY_TRACT | 2 refills | Status: DC | PRN
  Filled 2020-11-19: qty 150, 9d supply, fill #0
  Filled 2020-12-11: qty 150, 9d supply, fill #1
  Filled 2021-01-08: qty 150, 9d supply, fill #2

## 2020-11-19 MED ORDER — MONTELUKAST SODIUM 10 MG PO TABS
10.0000 mg | ORAL_TABLET | Freq: Every day | ORAL | 2 refills | Status: DC
  Filled 2020-11-19: qty 90, 90d supply, fill #0

## 2020-11-19 NOTE — Telephone Encounter (Signed)
  Notes to clinic:  Verify directions for refill    Requested Prescriptions  Pending Prescriptions Disp Refills   albuterol (PROVENTIL) (2.5 MG/3ML) 0.083% nebulizer solution 150 mL 2    Sig: Take 3 mLs (2.5 mg total) by nebulization every 4 (four) hours as needed for wheezing or shortness of breath (((PLAN B))).      Pulmonology:  Beta Agonists Failed - 11/19/2020  2:34 AM      Failed - One inhaler should last at least one month. If the patient is requesting refills earlier, contact the patient to check for uncontrolled symptoms.      Passed - Valid encounter within last 12 months    Recent Outpatient Visits           2 months ago Hypertension associated with type 2 diabetes mellitus [facility])   [place], MD   8 months ago Type 2 diabetes mellitus with obesity [facility])   [place], MD   10 months ago Essential hypertension   [place], Jarome Matin, RPH-CPP   11 months ago Panic disorder   [place], MD   11 months ago Essential hypertension   [place], RPH-CPP

## 2020-11-19 NOTE — Telephone Encounter (Signed)
Pt calling today to follow up on his sleep study. He has read the report and wonders if he will qualify for CPAP. Right now, he is waking every 2 hours and finds this disruptive.   I advised him it may take a little time for recommendation to be given. I will forward to Dr. Gardiner Rhyme, his RN, and sleep division for follow up.

## 2020-11-19 NOTE — Telephone Encounter (Signed)
New message:     Patient would like to know the results of his sleep study.

## 2020-11-19 NOTE — Telephone Encounter (Signed)
}    Notes to clinic:  Verify directions for refill    Requested Prescriptions  Pending Prescriptions Disp Refills   montelukast (SINGULAIR) 10 MG tablet 30 tablet 1    Sig: Take by mouth at bedtime.      Pulmonology:  Leukotriene Inhibitors Passed - 11/19/2020  2:34 AM      Passed - Valid encounter within last 12 months    Recent Outpatient Visits           2 months ago Hypertension associated with type 2 diabetes mellitus [facility])   [place], MD   8 months ago Type 2 diabetes mellitus with obesity [facility])   [place], MD   10 months ago Essential hypertension   [place], Jarome Matin, RPH-CPP   11 months ago Panic disorder   [place], MD   11 months ago Essential hypertension   [place], RPH-CPP

## 2020-11-20 ENCOUNTER — Other Ambulatory Visit: Payer: Self-pay

## 2020-11-20 ENCOUNTER — Encounter: Payer: Self-pay | Admitting: Podiatry

## 2020-11-20 ENCOUNTER — Ambulatory Visit (INDEPENDENT_AMBULATORY_CARE_PROVIDER_SITE_OTHER): Payer: 59 | Admitting: Podiatry

## 2020-11-20 DIAGNOSIS — B351 Tinea unguium: Secondary | ICD-10-CM | POA: Diagnosis not present

## 2020-11-20 DIAGNOSIS — M79675 Pain in left toe(s): Secondary | ICD-10-CM

## 2020-11-20 DIAGNOSIS — E119 Type 2 diabetes mellitus without complications: Secondary | ICD-10-CM

## 2020-11-20 DIAGNOSIS — M79674 Pain in right toe(s): Secondary | ICD-10-CM

## 2020-11-20 DIAGNOSIS — N289 Disorder of kidney and ureter, unspecified: Secondary | ICD-10-CM

## 2020-11-20 NOTE — Progress Notes (Signed)
This patient returns to my office for at risk foot care.  This patient requires this care by a professional since this patient will be at risk due to having diabetes.  This patient is unable to cut nails himself since the patient cannot reach his nails.These nails are painful walking and wearing shoes.  This patient presents for at risk foot care today.  General Appearance  Alert, conversant and in no acute stress.  Vascular  Dorsalis pedis and posterior tibial  pulses are palpable  bilaterally.  Capillary return is within normal limits  bilaterally. Temperature is within normal limits  bilaterally.  Neurologic  Senn-Weinstein monofilament wire test within normal limits  bilaterally. Muscle power within normal limits bilaterally.  Nails Thick disfigured discolored nails with subungual debris  from hallux to fifth toes bilaterally. No evidence of bacterial infection or drainage bilaterally.  Pincer nails  B/L.  Orthopedic  No limitations of motion  feet .  No crepitus or effusions noted.  No bony pathology or digital deformities noted.  Skin  normotropic skin with no porokeratosis noted bilaterally.  No signs of infections or ulcers noted.     Onychomycosis  Pain in right toes  Pain in left toes  Consent was obtained for treatment procedures.   Mechanical debridement of nails 1-5  bilaterally performed with a nail nipper.  Filed with dremel without incident.    Return office visit    3 months                  Told patient to return for periodic foot care and evaluation due to potential at risk complications.   Jameca Chumley DPM   

## 2020-11-23 ENCOUNTER — Other Ambulatory Visit: Payer: Self-pay | Admitting: Cardiovascular Disease

## 2020-11-23 ENCOUNTER — Other Ambulatory Visit: Payer: Self-pay

## 2020-11-23 DIAGNOSIS — G4733 Obstructive sleep apnea (adult) (pediatric): Secondary | ICD-10-CM

## 2020-11-23 DIAGNOSIS — G4736 Sleep related hypoventilation in conditions classified elsewhere: Secondary | ICD-10-CM

## 2020-11-23 NOTE — Telephone Encounter (Signed)
Patient notified of HST results and recommendations. He agrees to proceed with CPAP titration provided that he can be given a sleep aid. He states that he cannot "sleep in a hospital." PA will be submitted to [facility] for titration study. Patient also requested to go to [facility] due to long wait for appointment at [facility]. Message will be routed to MD for sleep aid RX.

## 2020-11-23 NOTE — Telephone Encounter (Signed)
-----   Message from Troy Sine, MD sent at 11/12/2020  7:50 AM EDT ----- Mariann Laster, please notify pt and schedule for in-lab CPAP titration.

## 2020-11-26 ENCOUNTER — Telehealth: Payer: Self-pay | Admitting: *Deleted

## 2020-11-26 NOTE — Telephone Encounter (Signed)
-----   Message from Lauralee Evener, [place] sent at 11/23/2020 10:42 AM EDT ----- Titration at [facility]. Request sleep aid.

## 2020-11-26 NOTE — Telephone Encounter (Signed)
Prior Authorization for CPAP titration sent to [facility] via web portal. Tracking Number [id number].

## 2020-11-27 NOTE — Telephone Encounter (Signed)
PA approval received from [facility] for CPAP titration. Auth #[id number]. Valid dates 12/27/20 to 02/27/21. Patient is requesting to be seen at [facility].did not want to wait for a [name] appointment. He is also requesting a sleep aid.we will get order from Dr Claiborne Billings for this. Message will be sent to [facility] for scheduling. Sleep lab has closed for the day, and I will be out next week on PAL.

## 2020-11-30 NOTE — Telephone Encounter (Signed)
Okay to prescribe a single dose of zolpidem 10 mg for his CPAP titration study.

## 2020-12-08 NOTE — Telephone Encounter (Signed)
Patient is scheduled for lab study on 02-12-21. Patient understands his sleep study will be done at [facility] sleep lab. Patient understands he will receive a sleep packet in a week or so. Patient understands to call if he does not receive the sleep packet in a timely manner.  Left detailed message on voicemail with date and time of titration and informed patient to call back to confirm or reschedule.

## 2020-12-11 ENCOUNTER — Other Ambulatory Visit: Payer: Self-pay | Admitting: Internal Medicine

## 2020-12-11 ENCOUNTER — Other Ambulatory Visit: Payer: Self-pay

## 2020-12-11 MED ORDER — ALBUTEROL SULFATE HFA 108 (90 BASE) MCG/ACT IN AERS
2.0000 | INHALATION_SPRAY | RESPIRATORY_TRACT | 1 refills | Status: DC | PRN
  Filled 2020-12-11: qty 18, 16d supply, fill #0
  Filled 2021-01-25: qty 18, 16d supply, fill #1

## 2020-12-11 NOTE — Telephone Encounter (Signed)
Requested medication (s) are due for refill today:no  Requested medication (s) are on the active medication list: yes   Last refill:  09/22/2020  Future visit scheduled:no  Notes to clinic:  Failed protocol: One inhaler should last at least one month. If the patient is requesting refills earlier, contact the patient to check for uncontrolled symptoms   Requested Prescriptions  Pending Prescriptions Disp Refills   albuterol (VENTOLIN HFA) 108 (90 Base) MCG/ACT inhaler 18 g 1    Sig: INHALE 2 PUFFS INTO THE LUNGS EVERY 4 (FOUR) HOURS AS NEEDED FOR WHEEZING OR SHORTNESS OF BREATH.      Pulmonology:  Beta Agonists Failed - 12/11/2020  2:40 AM      Failed - One inhaler should last at least one month. If the patient is requesting refills earlier, contact the patient to check for uncontrolled symptoms.      Passed - Valid encounter within last 12 months    Recent Outpatient Visits           3 months ago Hypertension associated with type 2 diabetes mellitus ([place])   [place] Karle Plumber B, MD   8 months ago Type 2 diabetes mellitus with obesity [facility])   [place], MD   11 months ago Essential hypertension   [place], Jarome Matin, RPH-CPP   11 months ago Panic disorder   [place], MD   1 year ago Essential hypertension   [place], RPH-CPP       Future Appointments             In 3 months Donato Heinz, MD [place], [facility]

## 2020-12-14 ENCOUNTER — Other Ambulatory Visit: Payer: Self-pay

## 2020-12-15 ENCOUNTER — Other Ambulatory Visit: Payer: Self-pay | Admitting: Cardiovascular Disease

## 2020-12-15 MED ORDER — ZOLPIDEM TARTRATE 10 MG PO TABS: ORAL_TABLET | ORAL | 0 refills | Status: DC

## 2020-12-15 NOTE — Telephone Encounter (Signed)
Zolpidem 10 mg phoned [facility]. Pharmacist instructed not to fill until 12/27/20. Patient's sleep study has been rescheduled to 12/28/20 at [facility] per patient's request.

## 2020-12-23 ENCOUNTER — Encounter: Payer: 59 | Admitting: Cardiovascular Disease

## 2020-12-28 ENCOUNTER — Other Ambulatory Visit: Payer: Self-pay

## 2020-12-28 ENCOUNTER — Ambulatory Visit: Payer: 59 | Attending: Cardiovascular Disease | Admitting: Cardiovascular Disease

## 2020-12-28 DIAGNOSIS — G4733 Obstructive sleep apnea (adult) (pediatric): Secondary | ICD-10-CM | POA: Insufficient documentation

## 2020-12-28 DIAGNOSIS — G4736 Sleep related hypoventilation in conditions classified elsewhere: Secondary | ICD-10-CM

## 2020-12-29 ENCOUNTER — Other Ambulatory Visit: Payer: Self-pay

## 2020-12-29 MED ORDER — BACLOFEN 10 MG PO TABS
ORAL_TABLET | ORAL | 3 refills | Status: DC
  Filled 2020-12-29: qty 270, 90d supply, fill #0
  Filled 2021-04-04: qty 270, 90d supply, fill #1
  Filled 2021-09-05: qty 90, 30d supply, fill #0
  Filled 2021-11-01: qty 90, 30d supply, fill #1
  Filled 2021-12-11: qty 90, 30d supply, fill #2

## 2020-12-29 MED ORDER — GABAPENTIN 300 MG PO CAPS
ORAL_CAPSULE | ORAL | 3 refills | Status: DC
  Filled 2020-12-29: qty 360, 90d supply, fill #0
  Filled 2021-04-04: qty 360, 90d supply, fill #1
  Filled 2021-09-05: qty 120, 30d supply, fill #0
  Filled 2021-11-01: qty 120, 30d supply, fill #1
  Filled 2021-11-27: qty 120, 30d supply, fill #2
  Filled 2021-12-26: qty 120, 30d supply, fill #3

## 2020-12-31 ENCOUNTER — Other Ambulatory Visit: Payer: Self-pay

## 2021-01-03 ENCOUNTER — Encounter: Payer: Self-pay | Admitting: Cardiovascular Disease

## 2021-01-03 NOTE — Procedures (Signed)
[place] [facility]        Patient Name: Joseph Harvey, Joseph Harvey Date: 12/28/2020 Gender: Male D.O.B: 03/16/1956 Age (years): 65 Referring Provider: Oswaldo Milian Height (inches): 69 Interpreting Physician: Shelva Majestic MD, ABSM Weight (lbs): 238 RPSGT: Rosebud Poles BMI: 35 MRN: [id number] Neck Size: 19.00  CLINICAL INFORMATION The patient is referred for a CPAP titration to treat sleep apnea.  Date of HST:  11/06/2020:  AHI 37.6/h; O2 nadir 74%.  SLEEP STUDY TECHNIQUE As per the AASM Manual for the Scoring of Sleep and Associated Events v2.3 (April 2016) with a hypopnea requiring 4% desaturations.  The channels recorded and monitored were frontal, central and occipital EEG, electrooculogram (EOG), submentalis EMG (chin), nasal and oral airflow, thoracic and abdominal wall motion, anterior tibialis EMG, snore microphone, electrocardiogram, and pulse oximetry. Continuous positive airway pressure (CPAP) was initiated at the beginning of the study and titrated to treat sleep-disordered breathing.  MEDICATIONS ezetimibe (ZETIA) 10 MG tablet (Expired) albuterol (PROVENTIL) (2.5 MG/3ML) 0.083% nebulizer solution albuterol (VENTOLIN HFA) 108 (90 Base) MCG/ACT inhaler amLODipine (NORVASC) 5 MG tablet ASPIRIN 81 PO baclofen (LIORESAL) 10 MG tablet Blood Glucose Monitoring Suppl (TRUE METRIX METER) w/Device KIT Budeson-Glycopyrrol-Formoterol (BREZTRI AEROSPHERE) 160-9-4.MCG/ACT AERO buPROPion (WELLBUTRIN XL) 150 MG 24 hr tablet busPIRone (BUSPAR) 10 MG tablet Cholecalciferol (VITAMIN D PO) fluticasone (FLONASE) 50 MCG/ACT nasal spray gabapentin (NEURONTIN) 300 MG capsule gabapentin (NEURONTIN) 300 MG capsule glucose blood (TRUE METRIX BLOOD GLUCOSE TEST) test strip ipratropium-albuterol (DUONEB) 0.5-2.5 (3) MG/3ML SOLN LORazepam (ATIVAN) 1 MG tablet metFORMIN (GLUCOPHAGE-XR) 500 MG 24 hr tablet montelukast (SINGULAIR) 10 MG  tablet Nebivolol HCl 20 MG TABS pantoprazole (PROTONIX) 40 MG tablet polyethylene glycol powder (GLYCOLAX/MIRALAX) 17 GM/SCOOP powder potassium chloride SA (KLOR-CON) 20 MEQ tablet Respiratory Therapy Supplies (FLUTTER) DEVI rosuvastatin (CRESTOR) 40 MG tablet TRUEplus Lancets 28G MISC valsartan (DIOVAN) 320 MG tablet zolpidem (AMBIEN) 10 MG tablet Medications self-administered by patient taken the night of the study : N/A  TECHNICIAN COMMENTS Comments added by technician: CPAP therapy started at 4 cm of H20 and increased to 10 cm of H2O due to events in REM stages. Suboptimal pressure obtained due to hyponiec events noticed in REM stages but there were no significant O2 desats. Patient tolerated CPAP fairly well. Patient took one Ambien of 10 mg prior to lights off time. EKG = Frequent PVC's noticed Comments added by scorer: N/A  RESPIRATORY PARAMETERS Optimal PAP Pressure (cm):  AHI at Optimal Pressure (/hr): N/A Overall Minimal O2 (%): 81.00 Supine % at Optimal Pressure (%): N/A Minimal O2 at Optimal Pressure (%): 81.00   SLEEP ARCHITECTURE The study was initiated at 10:22:36 PM and ended at 4:19:30 AM.  Sleep onset time was 5.8 minutes and the sleep efficiency was 69.9%. The total sleep time was 249.5 minutes.  The patient spent 9.22% of the night in stage N1 sleep, 66.93% in stage N2 sleep, 10.42% in stage N3 and 13.4% in REM.Stage REM latency was 95.5 minutes  Wake after sleep onset was 101.6. Alpha intrusion was absent. Supine sleep was 90.98%.  CARDIAC DATA The 2 lead EKG demonstrated sinus rhythm. The mean heart rate was 72.60 beats per minute. Other EKG findings include: PVCs.  LEG MOVEMENT DATA The total Periodic Limb Movements of Sleep (PLMS) were 0. The PLMS index  was 0.00. A PLMS index of <15 is considered normal in adults.  IMPRESSIONS - CPAP was initiated at 4 cm and was only titrated to 10 cm of water (AHI 10.8; O2 nadir 89%).  An optimal PAP pressure could not  be selected for this patient based on the available study data. - Moderate oxygen desaturations were observed during this titration to a nadir of  81% at5 cm.  . - The patient snored with moderate snoring volume during this titration study. - 2-lead EKG demonstrated: PVCs - Clinically significant periodic limb movements were not noted during this study. Arousals associated with PLMs were rare.  DIAGNOSIS - Obstructive Sleep Apnea (G47.33)  RECOMMENDATIONS - Recommend an initial trial of CPAP Auto with EPR of 3 at 11-20 cm of water with heated humidification.  - Effort should be made to optimize nasal and oropharyngeal patency.  - Avoid alcohol, sedatives and other CNS depressants that may worsen sleep apnea and disrupt normal sleep architecture. - Sleep hygiene should be reviewed to assess factors that may improve sleep quality. - Weight management (BMI 35) and regular exercise should be initiated or continued. - Recommend a download in 30 days and sleep clinic evaluation after 4 weeks of therapy.   [Electronically signed] 01/03/2021 10:57 AM  Shelva Majestic MD, [facility], [place], American Board of Sleep Medicine   NPI: [id number] PH: [phone]   FX: [phone] [place]

## 2021-01-04 NOTE — Telephone Encounter (Addendum)
The patient has been notified of the result.  Left detailed message on voicemail and informed patient to call back with questions. Marolyn Hammock, [place] 01/04/2021 11:17 AM    Upon patient request DME selection is [facility]. Patient understands he will be contacted by [place] to set up his cpap. Patient understands to call if [facility] does not contact him with new setup in a timely manner. Patient understands they will be called once confirmation has been received from [facility] that they have received their new machine to schedule 10 week follow up appointment.   [facility] notified of new cpap order  Please add to [facility]

## 2021-01-06 ENCOUNTER — Telehealth: Payer: Self-pay | Admitting: *Deleted

## 2021-01-06 NOTE — Telephone Encounter (Signed)
Patient notified Dr Claiborne Billings has completed his CPAP titration and CPAP order has been sent to [place].

## 2021-01-06 NOTE — Telephone Encounter (Signed)
-----   Message from Troy Sine, MD sent at 01/03/2021 11:05 AM EDT ----- Mariann Laster, please notify pt and set up with DME for CPAP Auto

## 2021-01-08 ENCOUNTER — Other Ambulatory Visit: Payer: Self-pay

## 2021-01-22 ENCOUNTER — Encounter: Payer: Self-pay | Admitting: Internal Medicine

## 2021-01-22 ENCOUNTER — Telehealth: Payer: Self-pay | Admitting: *Deleted

## 2021-01-22 NOTE — Telephone Encounter (Signed)
Returned a call the patient. Left message to call me again if assistance is still needed.

## 2021-01-23 ENCOUNTER — Encounter (HOSPITAL_BASED_OUTPATIENT_CLINIC_OR_DEPARTMENT_OTHER): Payer: Self-pay | Admitting: Emergency Medicine

## 2021-01-23 ENCOUNTER — Other Ambulatory Visit: Payer: Self-pay

## 2021-01-23 DIAGNOSIS — Z8616 Personal history of COVID-19: Secondary | ICD-10-CM | POA: Diagnosis not present

## 2021-01-23 DIAGNOSIS — J4521 Mild intermittent asthma with (acute) exacerbation: Secondary | ICD-10-CM | POA: Diagnosis not present

## 2021-01-23 DIAGNOSIS — E119 Type 2 diabetes mellitus without complications: Secondary | ICD-10-CM | POA: Insufficient documentation

## 2021-01-23 DIAGNOSIS — R0602 Shortness of breath: Secondary | ICD-10-CM

## 2021-01-23 DIAGNOSIS — Z8546 Personal history of malignant neoplasm of prostate: Secondary | ICD-10-CM | POA: Diagnosis not present

## 2021-01-23 DIAGNOSIS — Z7982 Long term (current) use of aspirin: Secondary | ICD-10-CM | POA: Diagnosis not present

## 2021-01-23 DIAGNOSIS — Z79899 Other long term (current) drug therapy: Secondary | ICD-10-CM | POA: Insufficient documentation

## 2021-01-23 DIAGNOSIS — Z7951 Long term (current) use of inhaled steroids: Secondary | ICD-10-CM | POA: Insufficient documentation

## 2021-01-23 DIAGNOSIS — Z7984 Long term (current) use of oral hypoglycemic drugs: Secondary | ICD-10-CM | POA: Insufficient documentation

## 2021-01-23 DIAGNOSIS — F17210 Nicotine dependence, cigarettes, uncomplicated: Secondary | ICD-10-CM | POA: Diagnosis not present

## 2021-01-23 DIAGNOSIS — U071 COVID-19: Secondary | ICD-10-CM | POA: Insufficient documentation

## 2021-01-23 DIAGNOSIS — I10 Essential (primary) hypertension: Secondary | ICD-10-CM | POA: Diagnosis not present

## 2021-01-23 NOTE — ED Notes (Signed)
Patient refused EKG.

## 2021-01-23 NOTE — ED Triage Notes (Signed)
Pt c/o shortness of breath onset yesterday. Pt diagnosed with COVID on Tuesday. Pt refuse EKG in triage reports "I don't think it's my heart."

## 2021-01-24 ENCOUNTER — Emergency Department (HOSPITAL_BASED_OUTPATIENT_CLINIC_OR_DEPARTMENT_OTHER)
Admission: EM | Admit: 2021-01-24 | Discharge: 2021-01-24 | Disposition: A | Discharge status: Home / Self Care | Payer: 59 | Attending: Emergency Medicine | Admitting: Emergency Medicine

## 2021-01-24 ENCOUNTER — Emergency Department (HOSPITAL_BASED_OUTPATIENT_CLINIC_OR_DEPARTMENT_OTHER): Payer: 59

## 2021-01-24 DIAGNOSIS — J4521 Mild intermittent asthma with (acute) exacerbation: Secondary | ICD-10-CM

## 2021-01-24 DIAGNOSIS — U071 COVID-19: Secondary | ICD-10-CM

## 2021-01-24 DIAGNOSIS — R0602 Shortness of breath: Secondary | ICD-10-CM

## 2021-01-24 HISTORY — DX: COVID-19: U07.1

## 2021-01-24 MED ORDER — DEXAMETHASONE SODIUM PHOSPHATE 10 MG/ML IJ SOLN
10.0000 mg | Freq: Once | INTRAMUSCULAR | Status: AC
  Administered 2021-01-24: 10 mg via INTRAMUSCULAR
  Filled 2021-01-24: qty 1

## 2021-01-24 MED ORDER — BENZONATATE 100 MG PO CAPS: 100.0000 mg | ORAL_CAPSULE | Freq: Three times a day (TID) | ORAL | 0 refills | Status: DC

## 2021-01-24 MED ORDER — PREDNISONE 20 MG PO TABS: 40.0000 mg | ORAL_TABLET | Freq: Every day | ORAL | 0 refills | Status: DC

## 2021-01-24 MED ORDER — HYDROCODONE BIT-HOMATROP MBR 5-1.5 MG/5ML PO SOLN: 5.0000 mL | Freq: Four times a day (QID) | ORAL | 0 refills | Status: DC | PRN

## 2021-01-24 NOTE — ED Provider Notes (Signed)
[facility] EMERGENCY DEPT Provider Note   CSN: [id number] Arrival date & time: 01/23/21  2259     History Chief Complaint  Patient presents with   Shortness of Breath    Joseph Harvey is a 65 y.o. male.  Patient presents to the emergency department for evaluation of difficulty breathing.  Patient reports that he was diagnosed with COVID earlier this week.  He has been on antiviral medication, last dose tomorrow.  Patient reports that he has a history of asthma and he has been having wheezing and shortness of breath despite using his albuterol.  He reports that he usually gets that shot for his asthma when it flares up.      Past Medical History:  Diagnosis Date   Allergy    Dust, mold, dust mites   Anemia    Asthma    Cancer ([place])    prostate   Cataract    bilateral repair.   COVID    Diabetes mellitus without complication ([place])    Glaucoma    Hyperlipidemia    Hypertension    Neuromuscular disorder ([place])    nerve damage from back surgery   Pneumonia     Patient Active Problem List   Diagnosis Date Noted   Pain due to onychomycosis of toenails of both feet 08/21/2020   Panic disorder 11/15/2019   Lipomatosis 07/09/2019   Low back pain 09/25/2018   Lumbar radiculopathy 09/25/2018   Constipation 09/17/2018   History of prostate cancer 09/17/2018   Hyperlipidemia associated with type 2 diabetes mellitus ([place]) 09/17/2018   History of paranasal sinus congestion 07/20/2018   Tobacco dependence 07/20/2018   Controlled type 2 diabetes mellitus without complication, without long-term current use of insulin ([place]) 07/20/2018   Vitamin D deficiency 08/18/2016   Hyperlipidemia LDL goal <100 08/10/2016   Renal insufficiency 07/11/2016   Family history of colon cancer 09/30/2015   GERD (gastroesophageal reflux disease) 09/30/2015   History of colon polyps 09/30/2015   Obesity, morbid ([place]) 03/07/2015   Allergic rhinitis 12/15/2014   Malignant neoplasm of  prostate ([place]) 06/02/2014   Cough 07/25/2012   HBP (high blood pressure) 06/01/2012   Asthma, severe persistent 05/30/2012   Asthma 05/30/2012    Past Surgical History:  Procedure Laterality Date   BACK SURGERY  2017   CATARACT EXTRACTION Bilateral    COLONOSCOPY     2013   COLONOSCOPY  10/03/2019   KNEE ARTHROSCOPY  2005   right   PROSTATE SURGERY     ROBOT ASSISTED LAPAROSCOPIC RADICAL PROSTATECTOMY  03/2010       Family History  Problem Relation Age of Onset   Colon cancer Father    Lung cancer Father        was a smoker   Prostate cancer Father    Lung cancer Mother        was a smoker   Asthma Mother    Allergies Brother    Diabetes Maternal Grandmother    Colon polyps Neg Hx    Esophageal cancer Neg Hx    Stomach cancer Neg Hx    Rectal cancer Neg Hx     Social History   Tobacco Use   Smoking status: Every Day    Packs/day: 0.50    Years: 46.00    Pack years: 23.00    Types: Cigarettes    Start date: 1976   Smokeless tobacco: Never   Tobacco comments:    still smoking 0.5 ppd  Vaping Use  Vaping Use: Never used  Substance Use Topics   Alcohol use: Yes    Alcohol/week: 3.0 standard drinks    Types: 3 Cans of beer per week    Comment: rarely   Drug use: No    Home Medications Prior to Admission medications   Medication Sig Start Date End Date Taking? Authorizing Provider  benzonatate (TESSALON) 100 MG capsule Take 1 capsule (100 mg total) by mouth every 8 (eight) hours. 01/24/21  Yes Sharbel Sahagun, Gwenyth Allegra, MD  ezetimibe (ZETIA) 10 MG tablet Take 1 tablet (10 mg total) by mouth daily. 09/22/20 04/08/21  Donato Heinz, MD  HYDROcodone bit-homatropine (HYCODAN) 5-1.5 MG/5ML syrup Take 5 mLs by mouth every 6 (six) hours as needed for cough. 01/24/21  Yes Jasdeep Dejarnett, Gwenyth Allegra, MD  predniSONE (DELTASONE) 20 MG tablet Take 2 tablets (40 mg total) by mouth daily with breakfast. 01/24/21  Yes [place] Stell, Gwenyth Allegra, MD  albuterol (PROVENTIL)  (2.5 MG/3ML) 0.083% nebulizer solution Take 3 mLs (2.5 mg total) by nebulization every 4 (four) hours as needed for wheezing or shortness of breath (((PLAN B))). 11/19/20   Ladell Pier, MD  albuterol (VENTOLIN HFA) 108 (90 Base) MCG/ACT inhaler INHALE 2 PUFFS INTO THE LUNGS EVERY 4 (FOUR) HOURS AS NEEDED FOR WHEEZING OR SHORTNESS OF BREATH. 12/11/20 12/11/21  Ladell Pier, MD  amLODipine (NORVASC) 5 MG tablet TAKE 1 TABLET (5 MG TOTAL) BY MOUTH 2 (TWO) TIMES DAILY. 08/20/20   Donato Heinz, MD  ASPIRIN 81 PO Take 81 mg by mouth daily.    [provider]  baclofen (LIORESAL) 10 MG tablet take 1 tablet by mouth 3 times a day 12/29/20     Blood Glucose Monitoring Suppl (TRUE METRIX METER) w/Device KIT Use as directed 01/07/19   Ladell Pier, MD  Budeson-Glycopyrrol-Formoterol (BREZTRI AEROSPHERE) 160-9-4.8 MCG/ACT AERO Inhale 2 puffs into the lungs in the morning and at bedtime. 09/11/20   Hunsucker, Bonna Gains, MD  Budeson-Glycopyrrol-Formoterol (BREZTRI AEROSPHERE) 160-9-4.8 MCG/ACT AERO Inhale 2 puffs into the lungs in the morning and at bedtime. 09/11/20   Hunsucker, Bonna Gains, MD  buPROPion (WELLBUTRIN XL) 150 MG 24 hr tablet TAKE 1 TABLET (150 MG TOTAL) BY MOUTH DAILY. 08/10/20 08/10/21  Ladell Pier, MD  busPIRone (BUSPAR) 10 MG tablet Take 1 tablet (10 mg total) by mouth 3 (three) times daily. 09/11/20   Ladell Pier, MD  Cholecalciferol (VITAMIN D PO) Take 1 tablet by mouth daily.    [provider]  fluticasone (FLONASE) 50 MCG/ACT nasal spray PLACE 2 SPRAYS INTO BOTH NOSTRILS DAILY. 01/29/20   Ladell Pier, MD  gabapentin (NEURONTIN) 300 MG capsule TAKE 1 CAPSULE IN THE MORNING,TAKE 1 CAP AT LUNCH AND TAKE 2 CAPS AT BEDTIME 06/30/20 06/30/21  Murlean Iba, MD  gabapentin (NEURONTIN) 300 MG capsule take 1 capsule by mouth in the morning, then take 1 at lunch and 2 at bedtime 12/29/20     glucose blood (TRUE METRIX BLOOD GLUCOSE TEST)  test strip Use as instructed 01/07/19   Ladell Pier, MD  ipratropium-albuterol (DUONEB) 0.5-2.5 (3) MG/3ML SOLN INHALE 1 VIAL VIA NEBULIZER TWICE A DAY 11/17/20   Ladell Pier, MD  LORazepam (ATIVAN) 1 MG tablet Take 1 tablet (74m) before cardiac MRI 05/18/20   SDonato Heinz MD  metFORMIN (GLUCOPHAGE-XR) 500 MG 24 hr tablet TAKE 1 TABLET BY MOUTH DAILY. 09/19/20 09/19/21  JLadell Pier MD  montelukast (SINGULAIR) 10 MG tablet Take 1 tablet (10  mg total) by mouth at bedtime. 11/19/20 02/21/21  Ladell Pier, MD  Nebivolol HCl 20 MG TABS Take 1 tablet (20 mg total) by mouth 2 (two) times daily. 09/03/20   Ladell Pier, MD  pantoprazole (PROTONIX) 40 MG tablet TAKE 1 TABLET (40 MG TOTAL) BY MOUTH DAILY. 03/19/20   Ladell Pier, MD  polyethylene glycol powder (GLYCOLAX/MIRALAX) 17 GM/SCOOP powder Take 17 g by mouth daily as needed. 04/18/19   Ladell Pier, MD  potassium chloride SA (KLOR-CON) 20 MEQ tablet Take 20 mEq by mouth as needed. When hands cramp up 02/23/17   [provider]  Respiratory Therapy Supplies (FLUTTER) DEVI Use three times a day after inhaler or nebulizer use 12/10/18   Elsie Stain, MD  rosuvastatin (CRESTOR) 40 MG tablet Take 1 tablet (40 mg total) by mouth daily. 09/18/20   Donato Heinz, MD  TRUEplus Lancets 28G MISC Use as directed 01/07/19   Ladell Pier, MD  valsartan (DIOVAN) 320 MG tablet Take 1 tablet (320 mg total) by mouth daily. 09/18/20 09/18/21  Donato Heinz, MD  zolpidem Lorrin Mais) 10 MG tablet Take upon arrival at sleep lab 12/15/20   Troy Sine, MD    Allergies    Amoxicillin, Lisinopril, Molds & smuts, Robitussin [guaifenesin], and Azithromycin  Review of Systems   Review of Systems  Respiratory:  Positive for cough, shortness of breath and wheezing.   All other systems reviewed and are negative.  Physical Exam Updated Vital Signs BP (!) 159/90   Pulse 82   Temp 98.6 F (37 C)  (Oral)   Resp (!) 26   Ht _0  (1.753 m)   Wt 107.5 kg   SpO2 94%   BMI 35.00 kg/m   Physical Exam Vitals and nursing note reviewed.  Constitutional:      General: He is not in acute distress.    Appearance: Normal appearance. He is well-developed.  HENT:     Head: Normocephalic and atraumatic.     Right Ear: Hearing normal.     Left Ear: Hearing normal.     Nose: Nose normal.  Eyes:     Conjunctiva/sclera: Conjunctivae normal.     Pupils: Pupils are equal, round, and reactive to light.  Cardiovascular:     Rate and Rhythm: Regular rhythm.     Heart sounds: S1 normal and S2 normal. No murmur heard.   No friction rub. No gallop.  Pulmonary:     Effort: Pulmonary effort is normal. No respiratory distress.     Breath sounds: Decreased breath sounds and wheezing present.  Chest:     Chest wall: No tenderness.  Abdominal:     General: Bowel sounds are normal.     Palpations: Abdomen is soft.     Tenderness: There is no abdominal tenderness. There is no guarding or rebound. Negative signs include Murphy's sign and McBurney's sign.     Hernia: No hernia is present.  Musculoskeletal:        General: Normal range of motion.     Cervical back: Normal range of motion and neck supple.  Skin:    General: Skin is warm and dry.     Findings: No rash.  Neurological:     Mental Status: He is alert and oriented to person, place, and time.     GCS: GCS eye subscore is 4. GCS verbal subscore is 5. GCS motor subscore is 6.     Cranial Nerves: No cranial  nerve deficit.     Sensory: No sensory deficit.     Coordination: Coordination normal.  Psychiatric:        Speech: Speech normal.        Behavior: Behavior normal.        Thought Content: Thought content normal.    ED Results / Procedures / Treatments   Labs (all labs ordered are listed, but only abnormal results are displayed) Labs Reviewed - No data to display  EKG None  Radiology DG Chest 1 View  Result Date:  01/24/2021 CLINICAL DATA:  Shortness of breath. EXAM: CHEST  1 VIEW COMPARISON:  Chest radiograph dated 06/20/2018. FINDINGS: No focal consolidation, pleural effusion or pneumothorax. The silhouette is limits. No acute osseous pathology. IMPRESSION: No active disease. Electronically Signed   By: Anner Crete M.D.   On: 01/24/2021 00:48    Procedures Procedures   Medications Ordered in ED Medications  dexamethasone (DECADRON) injection 10 mg (has no administration in time range)    ED Course  I have reviewed the triage vital signs and the nursing notes.  Pertinent labs & imaging results that were available during my care of the patient were reviewed by me and considered in my medical decision making (see chart for details).    MDM Rules/Calculators/A&P                           Patient presents to the emergency department for evaluation of cough, shortness of breath.  Patient was diagnosed with COVID earlier this week.  He has had 4 vaccination shots previously and is currently on antivirals.  His chest x-ray is clear, no pneumonitis.  His shortness of breath is secondary to asthma exacerbation.  He does have mild wheezing tonight.  No hypoxia.  We will give a shot of Decadron, course of prednisone.  Final Clinical Impression(s) / ED Diagnoses Final diagnoses:  Mild intermittent asthma with acute exacerbation  COVID-19    Rx / DC Orders ED Discharge Orders          Ordered    benzonatate (TESSALON) 100 MG capsule  Every 8 hours        01/24/21 0111    HYDROcodone bit-homatropine (HYCODAN) 5-1.5 MG/5ML syrup  Every 6 hours PRN        01/24/21 0111    predniSONE (DELTASONE) 20 MG tablet  Daily with breakfast        01/24/21 0112             Orpah Greek, MD 01/24/21 [phone]

## 2021-01-25 ENCOUNTER — Other Ambulatory Visit: Payer: Self-pay | Admitting: Internal Medicine

## 2021-01-25 ENCOUNTER — Other Ambulatory Visit: Payer: Self-pay

## 2021-01-25 MED ORDER — ALBUTEROL SULFATE (2.5 MG/3ML) 0.083% IN NEBU
2.5000 mg | INHALATION_SOLUTION | RESPIRATORY_TRACT | 2 refills | Status: DC | PRN
  Filled 2021-01-25: qty 150, 9d supply, fill #0
  Filled 2021-02-17: qty 150, 9d supply, fill #1
  Filled 2021-03-07: qty 150, 9d supply, fill #2

## 2021-01-25 NOTE — Telephone Encounter (Signed)
Requested medication (s) are due for refill today: last dispensed 01/08/21  Requested medication (s) are on the active medication list: yes  Last refill:  11/19/20 #150 ml 2 refills  Future visit scheduled: no  Notes to clinic:  [id number]     Requested Prescriptions  Pending Prescriptions Disp Refills   albuterol (PROVENTIL) (2.5 MG/3ML) 0.083% nebulizer solution 150 mL 2    Sig: Take 3 mLs (2.5 mg total) by nebulization every 4 (four) hours as needed for wheezing or shortness of breath (((PLAN B))).     Pulmonology:  Beta Agonists Failed - 01/25/2021  8:52 AM      Failed - One inhaler should last at least one month. If the patient is requesting refills earlier, contact the patient to check for uncontrolled symptoms.      Passed - Valid encounter within last 12 months    Recent Outpatient Visits           4 months ago Hypertension associated with type 2 diabetes mellitus ([place])   [place] Karle Plumber B, MD   10 months ago Type 2 diabetes mellitus with obesity [facility])   [place], Deborah B, MD   1 year ago Essential hypertension   [place], Stephen L, RPH-CPP   1 year ago Panic disorder   [place], MD   1 year ago Essential hypertension   [place], [place], RPH-CPP       Future Appointments             In 1 month Donato Heinz, MD [place], [facility]

## 2021-01-26 ENCOUNTER — Other Ambulatory Visit: Payer: Self-pay

## 2021-01-27 ENCOUNTER — Other Ambulatory Visit: Payer: Self-pay

## 2021-02-04 ENCOUNTER — Other Ambulatory Visit: Payer: Self-pay

## 2021-02-04 ENCOUNTER — Ambulatory Visit: Payer: 59 | Attending: Internal Medicine | Admitting: Internal Medicine

## 2021-02-04 ENCOUNTER — Encounter: Payer: Self-pay | Admitting: Internal Medicine

## 2021-02-04 VITALS — BP 140/90 | HR 84 | Resp 16 | Wt 240.0 lb

## 2021-02-04 DIAGNOSIS — H6123 Impacted cerumen, bilateral: Secondary | ICD-10-CM

## 2021-02-04 DIAGNOSIS — E1169 Type 2 diabetes mellitus with other specified complication: Secondary | ICD-10-CM

## 2021-02-04 DIAGNOSIS — I152 Hypertension secondary to endocrine disorders: Secondary | ICD-10-CM | POA: Diagnosis not present

## 2021-02-04 DIAGNOSIS — I422 Other hypertrophic cardiomyopathy: Secondary | ICD-10-CM

## 2021-02-04 DIAGNOSIS — E669 Obesity, unspecified: Secondary | ICD-10-CM

## 2021-02-04 DIAGNOSIS — E1159 Type 2 diabetes mellitus with other circulatory complications: Secondary | ICD-10-CM | POA: Diagnosis not present

## 2021-02-04 DIAGNOSIS — G4733 Obstructive sleep apnea (adult) (pediatric): Secondary | ICD-10-CM | POA: Diagnosis not present

## 2021-02-04 DIAGNOSIS — K219 Gastro-esophageal reflux disease without esophagitis: Secondary | ICD-10-CM

## 2021-02-04 DIAGNOSIS — Z6835 Body mass index (BMI) 35.0-35.9, adult: Secondary | ICD-10-CM

## 2021-02-04 DIAGNOSIS — Z9989 Dependence on other enabling machines and devices: Secondary | ICD-10-CM

## 2021-02-04 LAB — POCT GLYCOSYLATED HEMOGLOBIN (HGB A1C): HbA1c, POC (controlled diabetic range): 6.6 % (ref 0.0–7.0)

## 2021-02-04 MED ORDER — PANTOPRAZOLE SODIUM 40 MG PO TBEC
40.0000 mg | DELAYED_RELEASE_TABLET | Freq: Every day | ORAL | 1 refills | Status: DC | PRN
  Filled 2021-02-04: qty 90, 90d supply, fill #0
  Filled 2021-04-30: qty 90, 90d supply, fill #1

## 2021-02-04 NOTE — Patient Instructions (Addendum)
Purchased and use wax softener Debrox or Cerumenex over-the-counter.  Use it once a day in each ear for 3 to 4 days.  We will schedule you to come back sometime next week as a nurse only visit to have the ears flushed.  Let me know if you wanted to be referred for genetic counseling and testing for the hypertrophic cardiomyopathy.

## 2021-02-04 NOTE — Progress Notes (Signed)
Patient ID: Joseph Harvey, male    DOB: 05/07/1956  MRN: [id number]  CC: DNA gentetic testing   Subjective: Joseph Harvey is a 65 y.o. male who presents for chronic ds management His concerns today include:  DM controlled, HL, HTN, HCM (followed by cardiologist Dr. 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  OSA:  diagnosed with severe OSA.   on CPAP since 01/19/2021 He has been using it every night but is still in that phase of getting used to sleeping with a mask on his face.  He feels that his ears are plugged.  He gets nasal drainage in the mornings.  Using Flonase, Singulair on an over-the-counter medication (he is not sure if it is Claritin or Zyrtec).  HTN/HCM BP has been bouncing around.  He is on amlodipine 5 mg twice daily, Diovan and labetalol.  He was on triamterene/HCTZ in the past but it was discontinued by cardiology due to his diagnosis of HCM.  Reports having some swelling in the lower legs and feet last week but states it is much better now. -On last visit with his cardiologist, genetic testing was discussed because of his diagnosis of HCM.  According to the cardiologist note patient wanted to defer on that for now.  Encouraged him to have close family members checked. He had sent me a message requesting genetic testing.  I was not sure whether it was genetic testing for HCM.  However he tells me that he wants genetic testing for all of the medications that he is on   DM: Lab Results  Component Value Date   HGBA1C 6.6 02/04/2021    Checking BS QOD. 95-150 Not eating junk.  He has decreased portion sizes. Takes Metformin as needed.   Had COVID 01/20/2019.  Took Paxlovid.  He is recovered.  For health maintenance he is due for flu shot.  Patient Active Problem List   Diagnosis  Date Noted   Pain due to onychomycosis of toenails of both feet 08/21/2020   Panic disorder 11/15/2019   Lipomatosis 07/09/2019   Low back pain 09/25/2018   Lumbar radiculopathy 09/25/2018   Constipation 09/17/2018   History of prostate cancer 09/17/2018   Hyperlipidemia associated with type 2 diabetes mellitus ([place]) 09/17/2018   History of paranasal sinus congestion 07/20/2018   Tobacco dependence 07/20/2018   Controlled type 2 diabetes mellitus without complication, without long-term current use of insulin ([place]) 07/20/2018   Vitamin D deficiency 08/18/2016   Hyperlipidemia LDL goal <100 08/10/2016   Renal insufficiency 07/11/2016   Family history of colon cancer 09/30/2015   GERD (gastroesophageal reflux disease) 09/30/2015   History of colon polyps 09/30/2015   Obesity, morbid ([place]) 03/07/2015   Allergic rhinitis 12/15/2014   Malignant neoplasm of prostate ([place]) 06/02/2014   Cough 07/25/2012   HBP (high blood pressure) 06/01/2012   Asthma, severe persistent 05/30/2012   Asthma 05/30/2012     Current Outpatient Medications on File Prior to Visit  Medication Sig Dispense Refill   ezetimibe (ZETIA) 10 MG tablet Take 1 tablet (10 mg total) by mouth daily. 90 tablet 3   albuterol (PROVENTIL) (2.5 MG/3ML) 0.083% nebulizer solution Take 3 mLs (2.5 mg total) by nebulization every 4 (four) hours as needed for wheezing or shortness of breath (((PLAN B))). 150 mL 2   albuterol (VENTOLIN HFA) 108 (90  Base) MCG/ACT inhaler INHALE 2 PUFFS INTO THE LUNGS EVERY 4 (FOUR) HOURS AS NEEDED FOR WHEEZING OR SHORTNESS OF BREATH. 18 g 1   amLODipine (NORVASC) 5 MG tablet TAKE 1 TABLET (5 MG TOTAL) BY MOUTH 2 (TWO) TIMES DAILY. 180 tablet 3   ASPIRIN 81 PO Take 81 mg by mouth daily.     baclofen (LIORESAL) 10 MG tablet take 1 tablet by mouth 3 times a day 270 tablet 3   benzonatate (TESSALON) 100 MG capsule Take 1 capsule (100 mg total) by mouth every 8 (eight) hours. 21 capsule 0   Blood Glucose  Monitoring Suppl (TRUE METRIX METER) w/Device KIT Use as directed 1 kit 0   Budeson-Glycopyrrol-Formoterol (BREZTRI AEROSPHERE) 160-9-4.8 MCG/ACT AERO Inhale 2 puffs into the lungs in the morning and at bedtime. 10.7 g 11   buPROPion (WELLBUTRIN XL) 150 MG 24 hr tablet TAKE 1 TABLET (150 MG TOTAL) BY MOUTH DAILY. 90 tablet 0   busPIRone (BUSPAR) 10 MG tablet Take 1 tablet (10 mg total) by mouth 3 (three) times daily. 90 tablet 3   Cholecalciferol (VITAMIN D PO) Take 1 tablet by mouth daily.     fluticasone (FLONASE) 50 MCG/ACT nasal spray PLACE 2 SPRAYS INTO BOTH NOSTRILS DAILY. 16 g 11   gabapentin (NEURONTIN) 300 MG capsule take 1 capsule by mouth in the morning, then take 1 at lunch and 2 at bedtime 360 capsule 3   glucose blood (TRUE METRIX BLOOD GLUCOSE TEST) test strip Use as instructed 100 each 12   HYDROcodone bit-homatropine (HYCODAN) 5-1.5 MG/5ML syrup Take 5 mLs by mouth every 6 (six) hours as needed for cough. 75 mL 0   ipratropium-albuterol (DUONEB) 0.5-2.5 (3) MG/3ML SOLN INHALE 1 VIAL VIA NEBULIZER TWICE A DAY 180 mL 2   LORazepam (ATIVAN) 1 MG tablet Take 1 tablet (33m) before cardiac MRI 1 tablet 0   metFORMIN (GLUCOPHAGE-XR) 500 MG 24 hr tablet TAKE 1 TABLET BY MOUTH DAILY. 90 tablet 0   montelukast (SINGULAIR) 10 MG tablet Take 1 tablet (10 mg total) by mouth at bedtime. 30 tablet 2   Nebivolol HCl 20 MG TABS Take 1 tablet (20 mg total) by mouth 2 (two) times daily. 90 tablet 5   polyethylene glycol powder (GLYCOLAX/MIRALAX) 17 GM/SCOOP powder Take 17 g by mouth daily as needed. 3350 g 1   potassium chloride SA (KLOR-CON) 20 MEQ tablet Take 20 mEq by mouth as needed. When hands cramp up     predniSONE (DELTASONE) 20 MG tablet Take 2 tablets (40 mg total) by mouth daily with breakfast. 10 tablet 0   Respiratory Therapy Supplies (FLUTTER) DEVI Use three times a day after inhaler or nebulizer use 1 each 0   rosuvastatin (CRESTOR) 40 MG tablet Take 1 tablet (40 mg total) by mouth  daily. 90 tablet 3   TRUEplus Lancets 28G MISC Use as directed 100 each 4   valsartan (DIOVAN) 320 MG tablet Take 1 tablet (320 mg total) by mouth daily. 90 tablet 3   zolpidem (AMBIEN) 10 MG tablet Take upon arrival at sleep lab 1 tablet 0   No current facility-administered medications on file prior to visit.    Allergies  Allergen Reactions   Amoxicillin Anaphylaxis    Has patient had a PCN reaction causing immediate rash, facial/tongue/throat swelling, SOB or lightheadedness with hypotension: Yes Has patient had a PCN reaction causing severe rash involving mucus membranes or skin necrosis: No Has patient had a PCN reaction that required hospitalization: Yes Has  patient had a PCN reaction occurring within the last 10 years: Yes If all of the above answers are "NO", then may proceed with Cephalosporin use.    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[employer]  Tobacco Use   Smoking status: Every Day    Packs/day: 0.50    Years: 46.00    Pack years: 23.00    Types: Cigarettes    Start date: 1976   Smokeless tobacco: Never   Tobacco comments:    still smoking 0.5 ppd  Vaping Use   Vaping Use: Never used  Substance and Sexual Activity   Alcohol use: Yes    Alcohol/week: 3.0 standard drinks    Types: 3 Cans of beer per week    Comment: rarely   Drug use: No   Sexual activity: Not Currently  Other Topics Concern   Not on file  Social History Narrative   Not on file   Social Determinants of Health   Financial Resource Strain: Not on file  Food Insecurity: Not on file  Transportation Needs: Not on file   Physical Activity: Not on file  Stress: No Stress Concern Present   Feeling of Stress : Only a little  Social Connections: Not on file  Intimate Partner Violence: Not on file    Family History  Problem Relation Age of Onset   Colon cancer Father    Lung cancer Father        was a smoker   Prostate cancer Father    Lung cancer Mother        was a smoker   Asthma Mother    Allergies Brother    Diabetes Maternal Grandmother    Colon polyps Neg Hx    Esophageal cancer Neg Hx    Stomach cancer Neg Hx    Rectal cancer Neg Hx     Past Surgical History:  Procedure Laterality Date   BACK SURGERY  2017   CATARACT EXTRACTION Bilateral    COLONOSCOPY     2013   COLONOSCOPY  10/03/2019   KNEE ARTHROSCOPY  2005   right   PROSTATE SURGERY     ROBOT ASSISTED LAPAROSCOPIC RADICAL PROSTATECTOMY  03/2010    ROS: Review of Systems Negative except as stated above  PHYSICAL EXAM: BP 140/90   Pulse 84   Resp 16   Wt 240 lb (108.9 kg)   SpO2 96%   BMI 35.44 kg/m   Wt Readings from Last 3 Encounters:  02/04/21 240 lb (108.9 kg)  01/23/21 237 lb (107.5 kg)  09/17/20 238 lb (108 kg)    Physical Exam   General appearance - alert, well appearing, and in no distress Mental status - normal mood, behavior, speech, dress, motor activity, and thought processes Ears -moderate amount of dried wax in both ear canals obscuring view of the canal and tympanic membrane Nose -nasal mucosa is dry and passage patent Mouth - mucous membranes moist, pharynx normal without lesions Neck - supple, no significant adenopathy Chest -clear at this time without wheezes or crackles. Heart -  regular rate rhythm. Extremities -1+ lower extremity edema.  CMP Latest Ref Rng & Units 09/17/2020 08/19/2020 04/14/2020  Glucose 65 - 99 mg/dL 172(H) 122(H) 89  BUN 8 - 27 mg/dL '17 17 16  ' Creatinine 0.76 - 1.27 mg/dL 1.32(H) 1.17 1.28(H)  Sodium 134 - 144 mmol/L 140 143 140  Potassium 3.5 - 5.2 mmol/L 4.9 4.0 4.0   Chloride 96 - 106 mmol/L 101 103 98  CO2 20 - 29 mmol/L '21 23 27  ' Calcium 8.6 - 10.2 mg/dL 10.0 9.4 10.0  Total Protein 6.0 - 8.5 g/dL - - 7.3  Total Bilirubin 0.0 - 1.2 mg/dL - - 0.5  Alkaline Phos 44 - 121 IU/L - - 76  AST 0 - 40 IU/L - - 16  ALT 0 - 44 IU/L - - 19   Lipid Panel     Component Value Date/Time   CHOL 190 09/17/2020 0844   TRIG 137 09/17/2020 0844   HDL 63 09/17/2020 0844   CHOLHDL 3.0 09/17/2020 0844   LDLCALC 103 (H) 09/17/2020 0844    CBC    Component Value Date/Time   WBC 8.7 02/26/2020 1007   RBC 4.99 02/26/2020 1007   HGB 16.4 02/26/2020 1007   HGB 16.0 11/15/2019 0935   HCT 46.8 02/26/2020 1007   HCT 47.8 11/15/2019 0935   PLT 387.0 02/26/2020 1007   PLT 361 11/15/2019 0935   MCV 93.8 02/26/2020 1007   MCV 93 11/15/2019 0935   MCH 31.1 11/15/2019 0935   MCH 31.3 06/20/2018 0309   MCHC 35.0 02/26/2020 1007   RDW 12.6 02/26/2020 1007   RDW 12.4 11/15/2019 0935   LYMPHSABS 3.2 02/26/2020 1007   MONOABS 0.7 02/26/2020 1007   EOSABS 0.1 02/26/2020 1007   BASOSABS 0.2 (H) 02/26/2020 1007    ASSESSMENT AND PLAN: 1. Hypertension associated with type 2 diabetes mellitus ([place]) Not at goal. He is on maximum dose of Diovan and amlodipine.  He is also on Nebivolol.  I will send a message to his cardiologist inquiring whether we can try him with a very low-dose of HCTZ or whether he would recommend a different blood pressure medication altogether.  Advised patient that it is important that we try to avoid dehydration given that he has HCM. - POCT glycosylated hemoglobin (Hb A1C)  2. Type 2 diabetes mellitus with obesity ([facility]) At goal.  Encouraged him to continue trying to eat healthy.  3. OSA on CPAP Discussed the importance of continued use of the CPAP so that his insurance will continue to pay for it.  4. Impacted cerumen of both ears Recommend purchasing wax softener over-the-counter and using it for several days.  He can then follow-up as a nurse  only visit to have his ears flushed.  5. Hypertrophic cardiomyopathy ([place]) Followed by cardiology. Advised patient that he should consider getting the genetic counseling and testing done.  Having HCM puts him at risk for sudden cardiac death from arrhythmia.  6. Gastroesophageal reflux disease without esophagitis Patient requested refill on Protonix. - pantoprazole (PROTONIX) 40 MG tablet; Take 1 tablet (40 mg total) by mouth daily as needed.  Dispense: 90 tablet; Refill: 1  In regards to his request to have genetic testing for all of his medications, I recommended that he speak with his insurance first to see whether it would be covered so that he is not stuck with a huge bill from the laboratory.  Patient was given the opportunity to ask questions.  Patient verbalized understanding of  the plan and was able to repeat key elements of the plan.   Orders Placed This Encounter  Procedures   POCT glycosylated hemoglobin (Hb A1C)     Requested Prescriptions   Signed Prescriptions Disp Refills   pantoprazole (PROTONIX) 40 MG tablet 90 tablet 1    Sig: Take 1 tablet (40 mg total) by mouth daily as needed.    Return in about 4 months (around 06/06/2021) for Give nurse visit in 1 wk for ears flush and to get flu shot.  Karle Plumber, MD, FACP

## 2021-02-05 ENCOUNTER — Other Ambulatory Visit: Payer: Self-pay

## 2021-02-06 ENCOUNTER — Encounter: Payer: Self-pay | Admitting: Internal Medicine

## 2021-02-06 ENCOUNTER — Other Ambulatory Visit: Payer: Self-pay | Admitting: Internal Medicine

## 2021-02-06 DIAGNOSIS — E1159 Type 2 diabetes mellitus with other circulatory complications: Secondary | ICD-10-CM

## 2021-02-06 MED ORDER — HYDROCHLOROTHIAZIDE 12.5 MG PO CAPS
12.5000 mg | ORAL_CAPSULE | Freq: Every day | ORAL | 3 refills | Status: DC
  Filled 2021-02-06: qty 90, 90d supply, fill #0

## 2021-02-08 ENCOUNTER — Other Ambulatory Visit: Payer: Self-pay | Admitting: Cardiovascular Disease

## 2021-02-08 ENCOUNTER — Other Ambulatory Visit: Payer: Self-pay

## 2021-02-11 ENCOUNTER — Other Ambulatory Visit: Payer: Self-pay

## 2021-02-11 ENCOUNTER — Ambulatory Visit: Payer: 59 | Attending: Internal Medicine

## 2021-02-11 DIAGNOSIS — H6123 Impacted cerumen, bilateral: Secondary | ICD-10-CM | POA: Diagnosis not present

## 2021-02-11 NOTE — Progress Notes (Signed)
Pt states he already received flu vaccine.  Bilateral ear flush performed today. Pt tolerated well.

## 2021-02-12 ENCOUNTER — Encounter (HOSPITAL_BASED_OUTPATIENT_CLINIC_OR_DEPARTMENT_OTHER): Payer: 59 | Admitting: Cardiovascular Disease

## 2021-02-17 ENCOUNTER — Other Ambulatory Visit: Payer: Self-pay

## 2021-02-18 ENCOUNTER — Other Ambulatory Visit: Payer: Self-pay

## 2021-02-18 ENCOUNTER — Other Ambulatory Visit: Payer: Self-pay | Admitting: Internal Medicine

## 2021-02-18 ENCOUNTER — Telehealth: Payer: Self-pay | Admitting: *Deleted

## 2021-02-18 MED ORDER — FLUTICASONE PROPIONATE 50 MCG/ACT NA SUSP
2.0000 | Freq: Every day | NASAL | 11 refills | Status: DC
  Filled 2021-02-18: qty 16, 30d supply, fill #0
  Filled 2021-04-04: qty 16, 30d supply, fill #1
  Filled 2021-04-30: qty 16, 30d supply, fill #2
  Filled 2021-06-02: qty 16, 30d supply, fill #3
  Filled 2021-06-03 – 2021-07-12 (×3): qty 16, 30d supply, fill #0
  Filled 2021-08-12: qty 16, 30d supply, fill #1
  Filled 2021-10-07: qty 16, 30d supply, fill #2
  Filled 2021-11-08: qty 16, 30d supply, fill #3
  Filled 2021-12-02: qty 16, 30d supply, fill #4
  Filled 2022-01-10: qty 16, 30d supply, fill #5
  Filled 2022-02-09: qty 16, 30d supply, fill #6

## 2021-02-18 MED ORDER — ALBUTEROL SULFATE HFA 108 (90 BASE) MCG/ACT IN AERS
2.0000 | INHALATION_SPRAY | RESPIRATORY_TRACT | 1 refills | Status: DC | PRN
  Filled 2021-02-18: qty 18, 16d supply, fill #0
  Filled 2021-04-04: qty 18, 16d supply, fill #1

## 2021-02-18 NOTE — Telephone Encounter (Signed)
Staff message sent to [facility] patient set up on CPAP 01/19/21. 90 day compliance visit needed.

## 2021-02-18 NOTE — Telephone Encounter (Signed)
Requested Prescriptions  Pending Prescriptions Disp Refills  . albuterol (VENTOLIN HFA) 108 (90 Base) MCG/ACT inhaler 18 g 1    Sig: INHALE 2 PUFFS INTO THE LUNGS EVERY 4 (FOUR) HOURS AS NEEDED FOR WHEEZING OR SHORTNESS OF BREATH.     Pulmonology:  Beta Agonists Failed - 02/18/2021  9:50 AM      Failed - One inhaler should last at least one month. If the patient is requesting refills earlier, contact the patient to check for uncontrolled symptoms.      Passed - Valid encounter within last 12 months    Recent Outpatient Visits          2 weeks ago Hypertension associated with type 2 diabetes mellitus ([place])   [place] Karle Plumber B, MD   5 months ago Hypertension associated with type 2 diabetes mellitus [facility])   [place], MD   11 months ago Type 2 diabetes mellitus with obesity [facility])   [place], Deborah B, MD   1 year ago Essential hypertension   [place], RPH-CPP   1 year ago Panic disorder   [place], MD      Future Appointments            In 1 month Donato Heinz, MD [place], [facility]   In 3 months Ladell Pier, MD [place]           . fluticasone (FLONASE) 50 MCG/ACT nasal spray 16 g 11    Sig: PLACE 2 SPRAYS INTO BOTH NOSTRILS DAILY.     Ear, Nose, and Throat: Nasal Preparations - Corticosteroids Passed - 02/18/2021  9:50 AM      Passed - Valid encounter within last 12 months    Recent Outpatient Visits          2 weeks ago Hypertension associated with type 2 diabetes mellitus ([place])   [place], MD   5 months ago Hypertension associated with type 2 diabetes mellitus [facility])   [place], MD   11 months ago Type 2 diabetes mellitus with obesity [facility])   [place], Deborah B, MD   1 year ago Essential hypertension   [place], Stephen L, RPH-CPP   1 year ago Panic disorder   [place], MD      Future Appointments            In 1 month Donato Heinz, MD [place], [facility]   In 3 months Ladell Pier, MD [place]

## 2021-02-19 ENCOUNTER — Encounter: Payer: Self-pay | Admitting: Podiatry

## 2021-02-19 ENCOUNTER — Ambulatory Visit (INDEPENDENT_AMBULATORY_CARE_PROVIDER_SITE_OTHER): Payer: 59 | Admitting: Podiatry

## 2021-02-19 ENCOUNTER — Other Ambulatory Visit: Payer: Self-pay

## 2021-02-19 DIAGNOSIS — N289 Disorder of kidney and ureter, unspecified: Secondary | ICD-10-CM | POA: Diagnosis not present

## 2021-02-19 DIAGNOSIS — E119 Type 2 diabetes mellitus without complications: Secondary | ICD-10-CM | POA: Diagnosis not present

## 2021-02-19 DIAGNOSIS — M79675 Pain in left toe(s): Secondary | ICD-10-CM | POA: Diagnosis not present

## 2021-02-19 DIAGNOSIS — B351 Tinea unguium: Secondary | ICD-10-CM

## 2021-02-19 DIAGNOSIS — M79674 Pain in right toe(s): Secondary | ICD-10-CM

## 2021-02-19 NOTE — Progress Notes (Signed)
This patient returns to my office for at risk foot care.  This patient requires this care by a professional since this patient will be at risk due to having diabetes.  This patient is unable to cut nails himself since the patient cannot reach his nails.These nails are painful walking and wearing shoes.  This patient presents for at risk foot care today.  General Appearance  Alert, conversant and in no acute stress.  Vascular  Dorsalis pedis and posterior tibial  pulses are palpable  bilaterally.  Capillary return is within normal limits  bilaterally. Temperature is within normal limits  bilaterally.  Neurologic  Senn-Weinstein monofilament wire test within normal limits  bilaterally. Muscle power within normal limits bilaterally.  Nails Thick disfigured discolored nails with subungual debris  from hallux to fifth toes bilaterally. No evidence of bacterial infection or drainage bilaterally.  Pincer nails  B/L.  Orthopedic  No limitations of motion  feet .  No crepitus or effusions noted.  No bony pathology or digital deformities noted.  Skin  normotropic skin with no porokeratosis noted bilaterally.  No signs of infections or ulcers noted.     Onychomycosis  Pain in right toes  Pain in left toes  Consent was obtained for treatment procedures.   Mechanical debridement of nails 1-5  bilaterally performed with a nail nipper.  Filed with dremel without incident.    Return office visit    3 months                  Told patient to return for periodic foot care and evaluation due to potential at risk complications.   Xylia Scherger DPM   

## 2021-02-22 ENCOUNTER — Encounter: Payer: Self-pay | Admitting: Internal Medicine

## 2021-02-22 ENCOUNTER — Other Ambulatory Visit: Payer: Self-pay

## 2021-02-22 DIAGNOSIS — J4550 Severe persistent asthma, uncomplicated: Secondary | ICD-10-CM

## 2021-02-23 ENCOUNTER — Other Ambulatory Visit: Payer: Self-pay

## 2021-02-23 NOTE — Telephone Encounter (Signed)
Recommend starting with avoiding salty foods (would give salty six handout) and using compression stockings.  If not improved, can talk about increasing his HCTZ at upcoming appointment

## 2021-02-23 NOTE — Telephone Encounter (Signed)
Pt called back saying he needs the nebulizer asap. He wants it sent to the correct place that he mentioned in his my chart message.  He does not want it sent to [facility].  CB#  [phone]

## 2021-02-24 ENCOUNTER — Other Ambulatory Visit: Payer: Self-pay

## 2021-02-24 ENCOUNTER — Telehealth: Payer: Self-pay | Admitting: *Deleted

## 2021-02-24 NOTE — Telephone Encounter (Signed)
Copied from [place] [id number]. Topic: General - Other >> Feb 22, 2021  9:55 AM Yvette Rack wrote: Reason for CRM: Pt stated he needs a new nebulizer machine so he would like to ask Dr. Wynetta Emery to write a new Rx for a new machine.

## 2021-02-26 NOTE — Telephone Encounter (Signed)
Rx was sent on 02/24/21

## 2021-03-08 ENCOUNTER — Other Ambulatory Visit: Payer: Self-pay

## 2021-03-09 ENCOUNTER — Other Ambulatory Visit: Payer: Self-pay

## 2021-03-09 ENCOUNTER — Telehealth: Payer: Self-pay | Admitting: Internal Medicine

## 2021-03-09 MED ORDER — PREDNISONE 10 MG PO TABS
ORAL_TABLET | ORAL | 1 refills | Status: DC
  Filled 2021-03-09: qty 9, 3d supply, fill #0

## 2021-03-09 MED ORDER — ALBUTEROL SULFATE (2.5 MG/3ML) 0.083% IN NEBU
2.5000 mg | INHALATION_SOLUTION | RESPIRATORY_TRACT | 2 refills | Status: DC | PRN
  Filled 2021-03-17: qty 150, 9d supply, fill #0
  Filled 2021-04-04: qty 150, 9d supply, fill #1
  Filled 2021-04-18: qty 150, 9d supply, fill #2

## 2021-03-09 NOTE — Telephone Encounter (Signed)
Will forward to provider  

## 2021-03-09 NOTE — Telephone Encounter (Signed)
Pt states need med refill for predniSONE (DELTASONE) 20 MG tablet [[id number]]  last seen 02-04-21

## 2021-03-10 ENCOUNTER — Other Ambulatory Visit: Payer: Self-pay

## 2021-03-10 ENCOUNTER — Encounter: Payer: Self-pay | Admitting: Pulmonary Disease

## 2021-03-10 ENCOUNTER — Ambulatory Visit (INDEPENDENT_AMBULATORY_CARE_PROVIDER_SITE_OTHER): Payer: 59

## 2021-03-10 ENCOUNTER — Ambulatory Visit (INDEPENDENT_AMBULATORY_CARE_PROVIDER_SITE_OTHER): Payer: 59 | Admitting: Pulmonary Disease

## 2021-03-10 VITALS — BP 142/86 | HR 95 | Temp 98.1°F | Ht 69.0 in | Wt 248.0 lb

## 2021-03-10 DIAGNOSIS — R0789 Other chest pain: Secondary | ICD-10-CM

## 2021-03-10 DIAGNOSIS — F17210 Nicotine dependence, cigarettes, uncomplicated: Secondary | ICD-10-CM | POA: Diagnosis not present

## 2021-03-10 DIAGNOSIS — R053 Chronic cough: Secondary | ICD-10-CM | POA: Diagnosis not present

## 2021-03-10 DIAGNOSIS — J4550 Severe persistent asthma, uncomplicated: Secondary | ICD-10-CM | POA: Diagnosis not present

## 2021-03-10 DIAGNOSIS — J301 Allergic rhinitis due to pollen: Secondary | ICD-10-CM | POA: Diagnosis not present

## 2021-03-10 DIAGNOSIS — F17200 Nicotine dependence, unspecified, uncomplicated: Secondary | ICD-10-CM

## 2021-03-10 MED ORDER — PREDNISONE 20 MG PO TABS: 40.0000 mg | ORAL_TABLET | Freq: Every day | ORAL | 0 refills | Status: AC

## 2021-03-10 NOTE — Progress Notes (Signed)
Patient ID: Joseph Harvey, male    DOB: [birth year], 65 y.o.   MRN: [id number]  Chief Complaint  Patient presents with   Follow-up    Would like to have a cxr and blood work taken for lungs    Referring provider: Ladell Pier, MD  HPI:   Joseph Harvey is a 65 y.o. man whom we are seeing in follow-up for dyspnea exertion, asthma.  Cardiology note x2 reviewed.  Continue to follow with cardiology.  Recent polysomnography at their direction 12/2020 reviewed with diagnosis of sleep apnea.  He reports adherence to CPAP.  Has helped with his breathing some overall.  Reports intermittent chest tightness, wheeze, cough.  Worse with Lysol other clear irritants.  Ongoing chest congestion seems a bit worse.  Endorses good adherence to [employer] 2 puffs twice a day, Singulair.  Wheezy on exam.  Notably weight is up 11 to 15 pounds since last visit.  Endorses ongoing constipation, abdominal distention.  Thinks is making breathing worse.  Endorses right greater than left flank pain with exertion.  HPI at initial visit: [name] last in 2016.  At last visit symptoms are well controlled with Symbicort.  Unclear exactly what occurred in the last 5 years.  But over the last several months he endorses worsening dyspnea on exertion.  Just walking around going to the gym he has become short of breath.  Endorses severe shortness of breath.  This is resolved within a minute or 2 of albuterol administration.  In general his cough is much better than prior.  Suspect he is adhered well to Dr. Gustavus Bryant instructions regarding his airway cough syndrome.  He does feel like he has significant nasal congestion and postnasal drip.  This produces mucus in the back of her throat needs to clear her cough up but feels much different than his prior cough.  Scheduled to have sinus surgery but this was discontinued due to wheezing per his report.  His insurance is changed and now needs to find a new ENT doctor.   When he gets bad bouts of that mucus buildup he will take a dose of prednisone and within a minute, instantly the mucus feels better.  In terms of his dyspnea, rest improves his dyspnea.  There is no other aggravating or alleviating factors.  He has been on Breo for the last several months and says he thinks it helps somewhat with his breathing but is not at the level it was when he was followed by pulmonary prior.  Prior PFTs reviewed and interpreted as suggestive of mild restriction on spirometry, no fixed obstruction, lung volumes revealed TLC of 86% predicted, within normal limits or mildly reduced.  DLCO within normal limits.  Most recent chest x-ray 06/2018 reviewed instructed is clear lungs, current hyperinflation on PA film does not appear hyperinflated on lateral film.  PMH: Anxiety, obesity, diabetes, tobacco abuse, prostate cancer Surgical history: Lumbar back surgery Family History: Father with colon and lung cancer, mother with lung cancer Social history: Grew up in [place], lived there for 53 years, current smoker, 20+ pack year history   Questionaires / Pulmonary Flowsheets:   ACT:  Asthma Control Test ACT Total Score  03/10/2021 13  02/26/2020 9    MMRC: No flowsheet data found.  Epworth:  No flowsheet data found.  Tests:   FENO:  No results found for: NITRICOXIDE  PFT: PFT Results Latest Ref Rng & Units 02/28/2020  FVC-Pre L 2.06  FVC-Predicted Pre %  52  FVC-Post L 2.30  FVC-Predicted Post % 59  Pre FEV1/FVC % % 70  Post FEV1/FCV % % 74  FEV1-Pre L 1.43  FEV1-Predicted Pre % 47  FEV1-Post L 1.70  DLCO uncorrected ml/min/mmHg 21.37  DLCO UNC% % 80  DLCO corrected ml/min/mmHg 22.15  DLCO COR %Predicted % 82  DLVA Predicted % 123  Personally reviewed and interpreted as mixed restrictive and obstructive physiology with gas trapping, normal DLCO  WALK:  No flowsheet data found.  Imaging: Reviewed as per EMR  Lab Results: Personally reviewed, no  significant elevation of eosinophils, IgE and RAST panel negative in past CBC    Component Value Date/Time   WBC 8.7 02/26/2020 1007   RBC 4.99 02/26/2020 1007   HGB 16.4 02/26/2020 1007   HGB 16.0 11/15/2019 0935   HCT 46.8 02/26/2020 1007   HCT 47.8 11/15/2019 0935   PLT 387.0 02/26/2020 1007   PLT 361 11/15/2019 0935   MCV 93.8 02/26/2020 1007   MCV 93 11/15/2019 0935   MCH 31.1 11/15/2019 0935   MCH 31.3 06/20/2018 0309   MCHC 35.0 02/26/2020 1007   RDW 12.6 02/26/2020 1007   RDW 12.4 11/15/2019 0935   LYMPHSABS 3.2 02/26/2020 1007   MONOABS 0.7 02/26/2020 1007   EOSABS 0.1 02/26/2020 1007   BASOSABS 0.2 (H) 02/26/2020 1007    BMET    Component Value Date/Time   NA 140 09/17/2020 0844   K 4.9 09/17/2020 0844   CL 101 09/17/2020 0844   CO2 21 09/17/2020 0844   GLUCOSE 172 (H) 09/17/2020 0844   GLUCOSE 109 (H) 06/20/2018 0309   BUN 17 09/17/2020 0844   CREATININE 1.32 (H) 09/17/2020 0844   CALCIUM 10.0 09/17/2020 0844   GFRNONAA 59 (L) 04/14/2020 1036   GFRAA 68 04/14/2020 1036    BNP    Component Value Date/Time   BNP 25.1 07/24/2014 1256    ProBNP No results found for: PROBNP     Allergies  Allergen Reactions   Amoxicillin Anaphylaxis    Has patient had a PCN reaction causing immediate rash, facial/tongue/throat swelling, SOB or lightheadedness with hypotension: Yes Has patient had a PCN reaction causing severe rash involving mucus membranes or skin necrosis: No Has patient had a PCN reaction that required hospitalization: Yes Has patient had a PCN reaction occurring within the last 10 years: Yes If all of the above answers are "NO", then may proceed with Cephalosporin use.    Lisinopril Shortness Of Breath   Molds & Smuts Anaphylaxis   Robitussin [Guaifenesin] Shortness Of Breath    wheezing   Azithromycin Other (See Comments)    Immunization History  Administered Date(s) Administered   DTaP 03/16/2012   Influenza Split 02/07/2013,  02/14/2015, 01/21/2016   Influenza Whole 04/15/2012, 02/10/2020   Influenza, Seasonal, Injecte, Preservative Fre 02/07/2013, 02/14/2015, 01/21/2016, 01/27/2017   Influenza,inj,Quad PF,6+ Mos 01/28/2019   Influenza,inj,Quad PF,6-35 Mos 02/08/2021   Influenza-Unspecified 02/07/2013, 02/07/2013, 02/14/2015, 02/14/2015, 01/21/2016, 01/21/2016, 12/22/2016, 12/22/2016, 01/27/2017, 01/27/2017   PFIZER(Purple Top)SARS-COV-2 Vaccination 07/29/2019, 08/19/2019, 01/07/2020   Pneumococcal Conjugate-13 03/16/2015   Pneumococcal Polysaccharide-23 03/16/2012, 05/16/2013   Tdap 12/19/2016   Zoster Recombinat (Shingrix) 12/19/2016, 02/27/2017    Past Medical History:  Diagnosis Date   Allergy    Dust, mold, dust mites   Anemia    Asthma    Cancer ([place])    prostate   Cataract    bilateral repair.   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   Pneumonia     Tobacco History: Social History   Tobacco Use  Smoking Status Every Day   Packs/day: 0.50   Years: 46.00   Pack years: 23.00   Types: Cigarettes   Start date: 1976  Smokeless Tobacco Never  Tobacco Comments   still smoking 0.5 ppd   Ready to quit: Not Answered Counseling given: Not Answered Tobacco comments: still smoking 0.5 ppd     Outpatient Encounter Medications as of 03/10/2021  Medication Sig   albuterol (PROVENTIL) (2.5 MG/3ML) 0.083% nebulizer solution Take 3 mLs (2.5 mg total) by nebulization every 4 (four) hours as needed for wheezing or shortness of breath (((PLAN B))).   albuterol (VENTOLIN HFA) 108 (90 Base) MCG/ACT inhaler INHALE 2 PUFFS INTO THE LUNGS EVERY 4 (FOUR) HOURS AS NEEDED FOR WHEEZING OR SHORTNESS OF BREATH.   amLODipine (NORVASC) 5 MG tablet TAKE 1 TABLET (5 MG TOTAL) BY MOUTH 2 (TWO) TIMES DAILY.   ASPIRIN 81 PO Take 81 mg by mouth daily.   baclofen (LIORESAL) 10 MG tablet  take 1 tablet by mouth 3 times a day   benzonatate (TESSALON) 100 MG capsule Take 1 capsule (100 mg total) by mouth every 8 (eight) hours.   Blood Glucose Monitoring Suppl (TRUE METRIX METER) w/Device KIT Use as directed   Budeson-Glycopyrrol-Formoterol (BREZTRI AEROSPHERE) 160-9-4.8 MCG/ACT AERO Inhale 2 puffs into the lungs in the morning and at bedtime.   buPROPion (WELLBUTRIN XL) 150 MG 24 hr tablet TAKE 1 TABLET (150 MG TOTAL) BY MOUTH DAILY.   busPIRone (BUSPAR) 10 MG tablet Take 1 tablet (10 mg total) by mouth 3 (three) times daily.   Cholecalciferol (VITAMIN D PO) Take 1 tablet by mouth daily.   ezetimibe (ZETIA) 10 MG tablet Take 1 tablet (10 mg total) by mouth daily.   fluticasone (FLONASE) 50 MCG/ACT nasal spray PLACE 2 SPRAYS INTO BOTH NOSTRILS DAILY.   gabapentin (NEURONTIN) 300 MG capsule take 1 capsule by mouth in the morning, then take 1 at lunch and 2 at bedtime   glucose blood (TRUE METRIX BLOOD GLUCOSE TEST) test strip Use as instructed   HYDROcodone bit-homatropine (HYCODAN) 5-1.5 MG/5ML syrup Take 5 mLs by mouth every 6 (six) hours as needed for cough.   ipratropium-albuterol (DUONEB) 0.5-2.5 (3) MG/3ML SOLN INHALE 1 VIAL VIA NEBULIZER TWICE A DAY   LORazepam (ATIVAN) 1 MG tablet Take 1 tablet (61m) before cardiac MRI   metFORMIN (GLUCOPHAGE-XR) 500 MG 24 hr tablet TAKE 1 TABLET BY MOUTH DAILY.   Nebivolol HCl 20 MG TABS Take 1 tablet (20 mg total) by mouth 2 (two) times daily.   pantoprazole (PROTONIX) 40 MG tablet Take 1 tablet (40 mg total) by mouth daily as needed.   polyethylene glycol powder (GLYCOLAX/MIRALAX) 17 GM/SCOOP powder Take 17 g by mouth daily as needed.   potassium chloride SA (KLOR-CON) 20 MEQ tablet Take 20 mEq by mouth as needed. When hands cramp up   predniSONE (DELTASONE) 20 MG tablet Take 2 tablets (40 mg total) by mouth daily with breakfast for 5 days.   Respiratory Therapy Supplies (FLUTTER) DEVI Use three times a day after inhaler or nebulizer use    rosuvastatin (CRESTOR) 40 MG tablet Take 1 tablet (40 mg total) by mouth daily.   TRUEplus Lancets 28G MISC Use as directed   valsartan (DIOVAN) 320 MG tablet Take 1 tablet (  320 mg total) by mouth daily.   zolpidem (AMBIEN) 10 MG tablet Take upon arrival at sleep lab   [DISCONTINUED] predniSONE (DELTASONE) 10 MG tablet Take 2 tabs daily for 3 days then 1 tab daily x 3 days for asthma flare   hydrochlorothiazide (MICROZIDE) 12.5 MG capsule Take 1 capsule (12.5 mg total) by mouth daily. (Patient not taking: Reported on 03/10/2021)   montelukast (SINGULAIR) 10 MG tablet Take 1 tablet (10 mg total) by mouth at bedtime.   No facility-administered encounter medications on file as of 03/10/2021.     Review of Systems  Review of Systems  N/a Physical Exam  BP (!) 142/86 (BP Location: Left Arm, Cuff Size: Normal)   Pulse 95   Temp 98.1 F (36.7 C) (Oral)   Ht _0  (1.753 m)   Wt 248 lb (112.5 kg)   SpO2 97%   BMI 36.62 kg/m   Wt Readings from Last 5 Encounters:  03/10/21 248 lb (112.5 kg)  02/04/21 240 lb (108.9 kg)  01/23/21 237 lb (107.5 kg)  09/17/20 238 lb (108 kg)  09/11/20 237 lb (107.5 kg)    BMI Readings from Last 5 Encounters:  03/10/21 36.62 kg/m  02/04/21 35.44 kg/m  01/23/21 35.00 kg/m  09/17/20 35.15 kg/m  09/11/20 35.00 kg/m     Physical Exam General: Well-appearing, sitting up in exam chair Eyes: EOMI, icterus Neck: No JVP appreciated, neck supple, Respiratory: Expiratory wheeze on left mid and right mid lung field Cardiovascular: Tachycardic, regular rhythm, no murmurs Abdomen: Nondistended, bowel sounds present MSK: No joint effusion, no synovitis Neuro: Normal gait, no weakness Psych: Normal mood, full affect    Assessment & Plan:   Dyspnea on exertion: Suspect multifactorial.  Asthma as a contributor.  Mild wheezing on exam today.  Further discussion as below regarding asthma.  Other contributors include suspected anxiety/hyperventilation  syndrome, obesity, deconditioning.  Up to 15 pound weight gain since last visit. Likely a factor. Following with cardiology for HCM and now with OSA on CPAP. Ongoing smoking a factor. Repeat PFTs, suspect will be stable, not sure much more we can offer. If changed will address.  Asthma: He has atopic symptoms. On breztri. Residual wheeze. Course of prednisone 40 mg daily x 5 days. No elevated eos or IGE in past, 09/2020.  Chest pain: Sounds like abdominal distension. Mor accurately flank pain. CXR for further evaluation.  Smoking assessment and cessation counseling Patient currently smoking: I have advised the patient to quit/stop smoking as soon as possible due to high risk for multiple medical problems.  It will also be very difficult for [place] to manage patient's  respiratory symptoms and status if we continue to expose her lungs to a known irritant.  We do not advise e-cigarettes as a form of stopping smoking. Patient is willing to quit smoking.  I have advised the patient that we can assist and have options of nicotine replacement therapy, provided smoking cessation education today, provided smoking cessation counseling, and provided cessation resources. Has failed Wellbutrin and nicotine replacement. Failed efforts at smoking cessation clinic. Recommend hypnotism.   Return in about 3 months (around 06/10/2021).   Lanier Clam, MD 03/10/2021  I spent 45 minutes in care of patient including face to face visit, review of records, coordination of care.

## 2021-03-10 NOTE — Telephone Encounter (Signed)
Request for Prednisone noted.  Rxn sent by his pulmonologist already.  I had sent rxn 1-2 days ago also.

## 2021-03-10 NOTE — Telephone Encounter (Signed)
Pt went and seen pulmonologist today and they sent in rx for prednisone as well

## 2021-03-10 NOTE — Patient Instructions (Addendum)
Nice to see you again  I am glad CPAP is helping  Continue Joseph Harvey  We can repeat a chest xray to look into the pain  We can repeat breathing tests to make sure things are stable  I sent a new prescription for prednisone, 40 mg (2 tablets) every day once a day for the next 5 days.  If not improving by Monday, please call us and we will try to arrange a stronger steroid shot.  Return to clinic in 3 months or sooner as needed with Dr. Silas Flood

## 2021-03-15 ENCOUNTER — Encounter: Payer: Self-pay | Admitting: Internal Medicine

## 2021-03-15 ENCOUNTER — Telehealth: Payer: Self-pay | Admitting: Pulmonary Disease

## 2021-03-16 ENCOUNTER — Other Ambulatory Visit: Payer: Self-pay

## 2021-03-16 ENCOUNTER — Telehealth: Payer: Self-pay | Admitting: Internal Medicine

## 2021-03-16 DIAGNOSIS — K5909 Other constipation: Secondary | ICD-10-CM

## 2021-03-16 DIAGNOSIS — R140 Abdominal distension (gaseous): Secondary | ICD-10-CM

## 2021-03-16 MED ORDER — TRULANCE 3 MG PO TABS: 3.0000 mg | ORAL_TABLET | Freq: Every day | ORAL | 1 refills | Status: DC

## 2021-03-16 MED ORDER — LINACLOTIDE 145 MCG PO CAPS: 145.0000 ug | ORAL_CAPSULE | Freq: Every day | ORAL | 1 refills | Status: DC

## 2021-03-16 NOTE — Telephone Encounter (Signed)
I called the patient and he voices understanding. Nothing further needed.  °

## 2021-03-16 NOTE — Telephone Encounter (Signed)
Called and spoke to pt. Informed him of the results and recs per Dr. Silas Flood. Pt states he is still having discomfort on his right lower side. Pt states he feels like he had this pain before when he had H. Pylori. Advised pt he may need to reach out to GI/PCP for this but will send back to Dr. Silas Flood considering the CXR result, if this may change the recs.   Please advise. Thanks.

## 2021-03-16 NOTE — Telephone Encounter (Signed)
Called and spoke to pt. Pt is requesting the results of his CXR, pt states his s/s have not changed since last OV on 03/10/21. Pt also questioned when he can get his COVID booster since he contracted covid on 01/20/21.    Dr. Silas Flood, please advise. Thanks.

## 2021-03-16 NOTE — Telephone Encounter (Signed)
Agree with PCP evaluation of symptoms. Thanks!

## 2021-03-16 NOTE — Telephone Encounter (Signed)
Chest xray looks ok to my eye. If the prednisone did not help recommend he contact cardiologist for input. Weight gain could represent fluid.   It would be ok to get Covid booster at any time, recommend no later than first week of December.

## 2021-03-16 NOTE — Telephone Encounter (Signed)
Phone call placed to patient this afternoon.  He had sent me a MyChart message about wanting Linzess for chronic constipation and bloating.  Initially I had sent a prescription for Trulance as it seemed the Linzess was not the preferred medicine for his insurance.  However our pharmacist got a request for the authorization on the Trulance.  It turns out that the [place] is the preferred medicine for his insurance.  I informed patient to disregard the last message I sent to him on my chart regarding the Trulance.  I will send the Linzess instead.  He requests a 90-day supply.  He is also requesting to see the gastroenterologist.  For the past month he has been dealing with bloating when he eats.  I told him I will submit referral for him. He tells me that he has had to use his albuterol inhaler a lot more often recently.  He recently saw his pulmonologist.

## 2021-03-17 ENCOUNTER — Other Ambulatory Visit: Payer: Self-pay

## 2021-03-17 ENCOUNTER — Other Ambulatory Visit: Payer: Self-pay | Admitting: Internal Medicine

## 2021-03-18 ENCOUNTER — Other Ambulatory Visit: Payer: Self-pay

## 2021-03-18 MED ORDER — METFORMIN HCL ER 500 MG PO TB24
ORAL_TABLET | Freq: Every day | ORAL | 1 refills | Status: DC
  Filled 2021-03-18: qty 90, 90d supply, fill #0
  Filled 2021-07-11: qty 90, 90d supply, fill #1
  Filled 2021-07-12: qty 30, 30d supply, fill #0
  Filled 2021-08-12: qty 30, 30d supply, fill #1
  Filled 2021-09-05: qty 30, 30d supply, fill #2

## 2021-03-18 NOTE — Telephone Encounter (Signed)
Requested Prescriptions  Pending Prescriptions Disp Refills  . metFORMIN (GLUCOPHAGE-XR) 500 MG 24 hr tablet 90 tablet 1    Sig: TAKE 1 TABLET BY MOUTH DAILY.     Endocrinology:  Diabetes - Biguanides Failed - 03/17/2021 10:01 PM      Failed - Cr in normal range and within 360 days    Creatinine, Ser  Date Value Ref Range Status  09/17/2020 1.32 (H) 0.76 - 1.27 mg/dL Final         Passed - HBA1C is between 0 and 7.9 and within 180 days    HbA1c, POC (prediabetic range)  Date Value Ref Range Status  07/19/2018 6.0 5.7 - 6.4 % Final   HbA1c, POC (controlled diabetic range)  Date Value Ref Range Status  02/04/2021 6.6 0.0 - 7.0 % Final         Passed - AA eGFR in normal range and within 360 days    GFR calc Af Amer  Date Value Ref Range Status  04/14/2020 68 >59 mL/min/1.73 Final    Comment:    **In accordance with recommendations from the NKF-ASN Task force,**   [facility] is in the process of updating its eGFR calculation to the   2021 CKD-EPI creatinine equation that estimates kidney function   without a race variable.    GFR calc non Af Amer  Date Value Ref Range Status  04/14/2020 59 (L) >59 mL/min/1.73 Final   eGFR  Date Value Ref Range Status  09/17/2020 61 >59 mL/min/1.73 Final         Passed - Valid encounter within last 6 months    Recent Outpatient Visits          1 month ago Hypertension associated with type 2 diabetes mellitus ([place])   [place] Karle Plumber B, MD   6 months ago Hypertension associated with type 2 diabetes mellitus [facility])   [place], Deborah B, MD   1 year ago Type 2 diabetes mellitus with obesity [facility])   [place], Deborah B, MD   1 year ago Essential hypertension   [place], Stephen L, RPH-CPP   1 year ago Panic disorder   [place], MD      Future Appointments            In 1 week Donato Heinz, MD [place] [facility], [facility]   In 2 months Ladell Pier, MD [place]           . montelukast (SINGULAIR) 10 MG tablet 30 tablet 2    Sig: Take 1 tablet (10 mg total) by mouth at bedtime.     Pulmonology:  Leukotriene Inhibitors Passed - 03/17/2021 10:01 PM      Passed - Valid encounter within last 12 months    Recent Outpatient Visits          1 month ago Hypertension associated with type 2 diabetes mellitus [facility])   [place] Karle Plumber B, MD   6 months ago Hypertension associated with type 2 diabetes mellitus [facility])   [place] Ladell Pier, MD   1 year ago Type 2 diabetes mellitus with obesity [facility])    [facility] Ladell Pier, MD   1 year ago Essential hypertension  [place], RPH-CPP   1 year ago Panic disorder   [place], MD      Future Appointments            In 1 week Donato Heinz, MD [facility] [place], [place]   In 2 months Ladell Pier, MD [place]

## 2021-03-18 NOTE — Telephone Encounter (Signed)
Requested medications are due for refill today.  yes  Requested medications are on the active medications list.  yes  Last refill. 02/21/2021  Future visit scheduled.   no  Notes to clinic.  Prescription expired 02/21/2021.

## 2021-03-19 ENCOUNTER — Other Ambulatory Visit: Payer: Self-pay

## 2021-03-19 MED ORDER — MONTELUKAST SODIUM 10 MG PO TABS
10.0000 mg | ORAL_TABLET | Freq: Every day | ORAL | 2 refills | Status: DC
  Filled 2021-03-19: qty 90, 90d supply, fill #0

## 2021-03-22 ENCOUNTER — Other Ambulatory Visit: Payer: Self-pay

## 2021-03-25 ENCOUNTER — Ambulatory Visit (INDEPENDENT_AMBULATORY_CARE_PROVIDER_SITE_OTHER): Payer: 59 | Admitting: Cardiology

## 2021-03-25 ENCOUNTER — Ambulatory Visit (INDEPENDENT_AMBULATORY_CARE_PROVIDER_SITE_OTHER): Payer: 59

## 2021-03-25 ENCOUNTER — Other Ambulatory Visit: Payer: Self-pay

## 2021-03-25 VITALS — BP 148/70 | HR 97 | Resp 20 | Ht 69.0 in | Wt 252.6 lb

## 2021-03-25 DIAGNOSIS — I422 Other hypertrophic cardiomyopathy: Secondary | ICD-10-CM

## 2021-03-25 DIAGNOSIS — Z720 Tobacco use: Secondary | ICD-10-CM

## 2021-03-25 DIAGNOSIS — E785 Hyperlipidemia, unspecified: Secondary | ICD-10-CM | POA: Diagnosis not present

## 2021-03-25 DIAGNOSIS — I493 Ventricular premature depolarization: Secondary | ICD-10-CM | POA: Diagnosis not present

## 2021-03-25 DIAGNOSIS — I10 Essential (primary) hypertension: Secondary | ICD-10-CM

## 2021-03-25 MED ORDER — HYDROCHLOROTHIAZIDE 25 MG PO TABS: 25.0000 mg | ORAL_TABLET | Freq: Every day | ORAL | 3 refills | Status: DC

## 2021-03-25 NOTE — Progress Notes (Signed)
Cardiology Office Note:    Date:  03/26/2021   ID:  Joseph Harvey, DOB 1956-03-02, MRN [id number]  PCP:  Ladell Pier, MD  Cardiologist:  None  Electrophysiologist:  None   Referring MD: Ladell Pier, MD   Chief Complaint  Patient presents with   Cardiomyopathy     History of Present Illness:    Joseph Harvey is a 65 y.o. male with a hx of prostate cancer in remission, T2DM, hypertension, hyperlipidemia, [place]abbaco use who presents for follow-up.  He was referred by Dr. Wynetta Emery for evaluation of dyspnea on exertion, intially seen on 04/14/20.  Hospital he reports that he has been having chest pain with minimal exertion.  Also describes having chest pain.  Reports sharp left-sided chest pain, can occur at rest or with exertion.  Typically last for 10 minutes or so.  Also states he has been having occasional lightheadedness, denies any syncope.  Denies any lower extremity edema.  Reports BP has been anywhere from 110s to 160s when he checks at home.  Reports he had a prior test for sleep apnea which was negative.  He has smoked since age 60, less than 1 pack/day.  No known history of heart disease in his immediate family.  Echocardiogram on 03/18/2020 showed severe septal hypertrophy with otherwise mild concentric hypertrophy, mild intracavitary gradient (peak gradient 30 mmHg), normal biventricular function, grade 1 diastolic dysfunction, no significant valvular disease.  Lexiscan Myoview on 04/29/2020 showed normal perfusion, EF 57%.  Preventives monitor x3 days on 05/06/2020 showed 7 episodes of NSVT, longest lasting 3 beats, occasional PVCs (3.9% of beats).  Calcium score 117 on 04/29/2020 (81st percentile).  Cardiac MRI 06/23/2020 showed asymmetric hypertrophy measuring up to 20 mm in basal anterior septum (8 mm and posterior wall), consistent with hypertrophic cardiomyopathy.  Also with patchy LGE in basal septum and RV insertion sites consistent with HCM; LGE accounts for 11% of total  myocardial mass.  Normal LV/RV size and systolic function.  Since last clinic visit, he reports that he has been having issues with shortness of breath and lower extremity edema.  Has been using CPAP, feels like fatigue has significantly improved.  Has been having lightheadedness but no syncope.  He was walking 20 to 30 minutes/day but stopped in the last few weeks due to shortness of breath and dyspnea.  Has not been checking BP at home.  Continues to smoke about 0.5 packs/day.  Weight is up 14 pounds in last 6 months.   BP Readings from Last 3 Encounters:  03/25/21 (!) 148/70  03/10/21 (!) 142/86  02/04/21 140/90    Wt Readings from Last 3 Encounters:  03/25/21 252 lb 9.6 oz (114.6 kg)  03/10/21 248 lb (112.5 kg)  02/04/21 240 lb (108.9 kg)     Past Medical History:  Diagnosis Date   Allergy    Dust, mold, dust mites   Anemia    Asthma    Cancer ([place])    prostate   Cataract    bilateral repair.   COVID    Diabetes mellitus without complication ([place])    Glaucoma    Hyperlipidemia    Hypertension    Neuromuscular disorder ([place])    nerve damage from back surgery   Pneumonia     Past Surgical History:  Procedure Laterality Date   BACK SURGERY  2017   CATARACT EXTRACTION Bilateral    COLONOSCOPY     2013   COLONOSCOPY  10/03/2019   KNEE ARTHROSCOPY  2005   right   PROSTATE SURGERY     ROBOT ASSISTED LAPAROSCOPIC RADICAL PROSTATECTOMY  03/2010    Current Medications: Current Meds  Medication Sig   hydrochlorothiazide (HYDRODIURIL) 25 MG tablet Take 1 tablet (25 mg total) by mouth daily.     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[employer] [place]  Tobacco Use    Smoking status: Every Day    Packs/day: 0.50    Years: 46.00    Pack years: 23.00    Types: Cigarettes    Start date: 1976   Smokeless tobacco: Never   Tobacco comments:    still smoking 0.5 ppd  Vaping Use   Vaping Use: Never used  Substance and Sexual Activity   Alcohol use: Yes    Alcohol/week: 3.0 standard drinks    Types: 3 Cans of beer per week    Comment: rarely   Drug use: No   Sexual activity: Not Currently  Other Topics Concern   Not on file  Social History Narrative   Not on file   Social Determinants of Health   Financial Resource Strain: Not on file  Food Insecurity: Not on file  Transportation Needs: Not on file  Physical Activity: Not on file  Stress: No Stress Concern Present   Feeling of Stress : Only a little  Social Connections: Not on file     Family History: The patient's family history includes Allergies in his brother; Asthma in his mother; Colon cancer in his father; Diabetes in his maternal grandmother; Lung cancer in his father and mother; Prostate cancer in his father. There is no history of Colon polyps, Esophageal cancer, Stomach cancer, or Rectal cancer.  ROS:   Please see the history of present illness.     All other systems reviewed and are negative.  EKGs/Labs/Other Studies Reviewed:    The following studies were reviewed today:   EKG:   04/14/20: normal sinus rhythm, PVCs, no ST abnormalities 05/18/20: EKG was not ordered. 07/14/20: EKG was not ordered. 09/17/20: Sinus rhythm. Rate 96 bpm PVCs, No ST abnormalities. 03/25/21: Normal sinus rhythm, frequent PVCs (trigeminy)  Recent Labs: 03/25/2021: ALT 17; BNP WILL FOLLOW; BUN 14; Creatinine, Ser 1.08; Magnesium 2.0; Potassium 4.1; Sodium 139  Recent Lipid Panel    Component Value Date/Time   CHOL 184 03/25/2021 0926   TRIG 472 (H) 03/25/2021 0926   HDL 61 03/25/2021 0926   CHOLHDL 3.0 03/25/2021 0926   LDLCALC 53 03/25/2021 0926    Physical Exam:    VS:  BP (!) 148/70  (BP Location: Left Arm, Patient Position: Sitting, Cuff Size: Normal)   Pulse 97   Resp 20   Ht 5\' 9"  (1.753 m)   Wt 252 lb 9.6 oz (114.6 kg)   SpO2 100%   BMI 37.30 kg/m     Wt Readings from Last 3 Encounters:  03/25/21 252 lb 9.6 oz (114.6 kg)  03/10/21 248 lb (112.5 kg)  02/04/21 240 lb (108.9 kg)     GEN:  Well nourished, well developed in no acute distress HEENT: Normal NECK: No JVD; No carotid bruits LYMPHATICS: No lymphadenopathy CARDIAC: Irregular, no murmurs,  rubs, gallops RESPIRATORY: Expiratory wheezing ABDOMEN: Soft, non-tender, non-distended MUSCULOSKELETAL:  1+ bilateral edema; No deformity  SKIN: Warm and dry NEUROLOGIC:  Alert and oriented x 3 PSYCHIATRIC:  Normal affect   ASSESSMENT:    1. Hypertrophic cardiomyopathy ([place])   2. Essential hypertension   3. Tobacco use   4. Hyperlipidemia, unspecified hyperlipidemia type   5. PVC's (premature ventricular contractions)     PLAN:    HCM: Cardiac MRI 06/23/2020 showed asymmetric hypertrophy measuring up to 20 mm and basal anterior septum (8 mm and posterior wall), consistent with hypertrophic cardiomyopathy.  Also with patchy LGE in basal septum and RV insertion sites consistent with HCM; LGE accounts for 11% of total myocardial mass.  Normal LV/RV size and systolic function.  Echocardiogram on 03/18/2020 showed mild LVOT obstruction, peak gradient 13 mmHg. -Recommended first-degree relatives be screened for HCM: He reports he will speak to his daughters and brother/sisters.  Discussed genetic testing but he requests to hold off at this time -Ideally try to avoid diuretics and HCM but he is volume overloaded on exam.  Will increase hydrochlorothiazide to 25 mg daily.  Check echo to evaluate for worsening LVOT obstruction -Continue nebivolol 20 mg BID -Short episodes of NSVT (longest lasting 3 beats) on cardiac monitoring.  Given HCM with age greater than 64, ICD not indicated  Chest pain/DOE: Atypical in  description but does have CAD risk factors (hypertension, hyperlipidemia, T2DM).   Lexiscan Myoview on 04/29/2020 showed normal perfusion, EF 57%.    PVCs: [employer] monitor x3 days on 05/06/2020 showed 7 episodes of NSVT, longest lasting 3 beats, occasional PVCs (3.9% of beats). -Frequent PVCs on EKG in clinic today, will repeat monitor x3 days  Leg pain: Normal ABIs on 04/23/2019  Hypertension: On amlodipine 10 mg daily, HCTZ 12.5 mg daily, valsartan 017 mg, Bystolic 20 mg daily twice daily.  -BP elevated, will increase HCTZ to 25 mg daily.  Check CMP, magnesium.  Schedule follow-up in pharmacy hypertension clinic in 2 weeks.  Asked him to check BP daily for next 2 weeks and bring log with him  Tobacco use: Patient counseled on the risk of tobacco use and cessation strongly encouraged.  Will refer to our care guide to work with patient to assist in smoking cessation  Hyperlipidemia: On rosuvastatin 40 mg daily, LDL 103 on 09/2020.  Added Zetia 10 mg daily.  Calcium score 117 on 04/29/2020 (81st percentile).  Recheck lipid panel  T2DM: On Metformin.  A1c 6.7% on 09/18/2018  OSA: Sleep study 10/2020 showed severe OSA, he is now on CPAP and reports compliance  RTC in 6 weeks    Medication Adjustments/Labs and Tests Ordered: Current medicines are reviewed at length with the patient today.  Concerns regarding medicines are outlined above.  Orders Placed This Encounter  Procedures   Comprehensive metabolic panel   Lipid panel   Magnesium   Brain natriuretic peptide   LONG TERM MONITOR (3-14 DAYS)   EKG 12-Lead   ECHOCARDIOGRAM COMPLETE    Meds ordered this encounter  Medications   hydrochlorothiazide (HYDRODIURIL) 25 MG tablet    Sig: Take 1 tablet (25 mg total) by mouth daily.    Dispense:  90 tablet    Refill:  3    Dose increase     Patient Instructions  Medication Instructions:  INCREASE hydrochlorothiazide (HCTZ) to 25 mg daily  *If you need a refill on your cardiac  medications before your next appointment, please call your pharmacy*   Lab Work:  CMET, Mag, Lipid, BNP today  If you have labs (blood work) drawn today and your tests are completely normal, you will receive your results only by: MyChart Message (if you have MyChart) OR A paper copy in the mail If you have any lab test that is abnormal or we need to change your treatment, we will call you to review the results.   Testing/Procedures: Your physician has requested that you have an echocardiogram. Echocardiography is a painless test that uses sound waves to create images of your heart. It provides your doctor with information about the size and shape of your heart and how well your heart's chambers and valves are working. This procedure takes approximately one hour. There are no restrictions for this procedure.  ZIO XT- Long Term Monitor Instructions   Your physician has requested you wear a ZIO patch monitor for _3__ days.  This is a single patch monitor.   [employer] supplies one patch monitor per enrollment. Additional stickers are not available. Please do not apply patch if you will be having a Nuclear Stress Test, Echocardiogram, Cardiac CT, MRI, or Chest Xray during the period you would be wearing the monitor. The patch cannot be worn during these tests. You cannot remove and re-apply the ZIO XT patch monitor.  Your ZIO patch monitor will be sent Fed Ex from [employer] directly to your home address. It may take 3-5 days to receive your monitor after you have been enrolled.  Once you have received your monitor, please review the enclosed instructions. Your monitor has already been registered assigning a specific monitor serial # to you.  Billing and Patient Assistance Program Information   We have supplied [employer] with any of your insurance information on file for billing purposes. [employer] offers a sliding scale Patient Assistance Program for patients that do not have insurance, or  whose insurance does not completely cover the cost of the ZIO monitor.   You must apply for the Patient Assistance Program to qualify for this discounted rate.     To apply, please call [employer] at [phone], select option 4, then select option 2, and ask to apply for Patient Assistance Program.  Theodore Demark will ask your household income, and how many people are in your household.  They will quote your out-of-pocket cost based on that information.  [employer] will also be able to set up a 52-month, interest-free payment plan if needed.  Applying the monitor   Shave hair from upper left chest.  Hold abrader disc by orange tab. Rub abrader in 40 strokes over the upper left chest as indicated in your monitor instructions.  Clean area with 4 enclosed alcohol pads. Let dry.  Apply patch as indicated in monitor instructions. Patch will be placed under collarbone on left side of chest with arrow pointing upward.  Rub patch adhesive wings for 2 minutes. Remove white label marked "1". Remove the white label marked "2". Rub patch adhesive wings for 2 additional minutes.  While looking in a mirror, press and release button in center of patch. A small green light will flash 3-4 times. This will be your only indicator that the monitor has been turned on. ?  Do not shower for the first 24 hours. You may shower after the first 24 hours.  Press the button if you feel a symptom. You will hear a small click. Record Date, Time and Symptom in the Patient Logbook.  When you are ready to remove the patch, follow instructions on the last  2 pages of the Patient Logbook. Stick patch monitor onto the last page of Patient Logbook.  Place Patient Logbook in the blue and white box.  Use locking tab on box and tape box closed securely.  The blue and white box has prepaid postage on it. Please place it in the mailbox as soon as possible. Your physician should have your test results approximately 7 days after the monitor has been mailed  back to [facility].  Call [place] at [phone] if you have questions regarding your ZIO XT patch monitor. Call them immediately if you see an orange light blinking on your monitor.  If your monitor falls off in less than 4 days, contact our Monitor department at [phone]. ?If your monitor becomes loose or falls off after 4 days call [employer] at [phone] for suggestions on securing your monitor.?  Follow-Up: At [facility], you and your health needs are our priority.  As part of our continuing mission to provide you with exceptional heart care, we have created designated Provider Care Teams.  These Care Teams include your primary Cardiologist (physician) and Advanced Practice Providers (APPs -  Physician Assistants and Nurse Practitioners) who all work together to provide you with the care you need, when you need it.  We recommend signing up for the patient portal called "MyChart".  Sign up information is provided on this After Visit Summary.  MyChart is used to connect with patients for Virtual Visits (Telemedicine).  Patients are able to view lab/test results, encounter notes, upcoming appointments, etc.  Non-urgent messages can be sent to your provider as well.   To learn more about what you can do with MyChart, go to [email or web address].    Your next appointment:   2 weeks with pharmacist 6 weeks with Dr. Gardiner Rhyme: 12/21 at 11:30 AM   Please check your blood pressure at home twice daily, write it down.  Call the office or send message via Mychart with the readings in 2 weeks for Dr. Gardiner Rhyme to review.      Signed, Donato Heinz, MD  03/26/2021 8:36 AM    [place]

## 2021-03-25 NOTE — Progress Notes (Unsigned)
Patient enrolled for [employer] to mail a 3 day ZIO XT monitor to his address on file.

## 2021-03-25 NOTE — Patient Instructions (Addendum)
Medication Instructions:  INCREASE hydrochlorothiazide (HCTZ) to 25 mg daily  *If you need a refill on your cardiac medications before your next appointment, please call your pharmacy*   Lab Work: CMET, Mag, Lipid, BNP today  If you have labs (blood work) drawn today and your tests are completely normal, you will receive your results only by: [place] (if you have MyChart) OR A paper copy in the mail If you have any lab test that is abnormal or we need to change your treatment, we will call you to review the results.   Testing/Procedures: Your physician has requested that you have an echocardiogram. Echocardiography is a painless test that uses sound waves to create images of your heart. It provides your doctor with information about the size and shape of your heart and how well your heart's chambers and valves are working. This procedure takes approximately one hour. There are no restrictions for this procedure.  ZIO XT- Long Term Monitor Instructions   Your physician has requested you wear a ZIO patch monitor for _3__ days.  This is a single patch monitor.   [employer] supplies one patch monitor per enrollment. Additional stickers are not available. Please do not apply patch if you will be having a Nuclear Stress Test, Echocardiogram, Cardiac CT, MRI, or Chest Xray during the period you would be wearing the monitor. The patch cannot be worn during these tests. You cannot remove and re-apply the ZIO XT patch monitor.  Your ZIO patch monitor will be sent Fed Ex from [employer] directly to your home address. It may take 3-5 days to receive your monitor after you have been enrolled.  Once you have received your monitor, please review the enclosed instructions. Your monitor has already been registered assigning a specific monitor serial # to you.  Billing and Patient Assistance Program Information   We have supplied [employer] with any of your insurance information on file for  billing purposes. [employer] offers a sliding scale Patient Assistance Program for patients that do not have insurance, or whose insurance does not completely cover the cost of the ZIO monitor.   You must apply for the Patient Assistance Program to qualify for this discounted rate.     To apply, please call [employer] at [phone], select option 4, then select option 2, and ask to apply for Patient Assistance Program.  Theodore Demark will ask your household income, and how many people are in your household.  They will quote your out-of-pocket cost based on that information.  [employer] will also be able to set up a 26-month, interest-free payment plan if needed.  Applying the monitor   Shave hair from upper left chest.  Hold abrader disc by orange tab. Rub abrader in 40 strokes over the upper left chest as indicated in your monitor instructions.  Clean area with 4 enclosed alcohol pads. Let dry.  Apply patch as indicated in monitor instructions. Patch will be placed under collarbone on left side of chest with arrow pointing upward.  Rub patch adhesive wings for 2 minutes. Remove white label marked "1". Remove the white label marked "2". Rub patch adhesive wings for 2 additional minutes.  While looking in a mirror, press and release button in center of patch. A small green light will flash 3-4 times. This will be your only indicator that the monitor has been turned on. ?  Do not shower for the first 24 hours. You may shower after the first 24 hours.  Press the button if you  feel a symptom. You will hear a small click. Record Date, Time and Symptom in the Patient Logbook.  When you are ready to remove the patch, follow instructions on the last 2 pages of the Patient Logbook. Stick patch monitor onto the last page of Patient Logbook.  Place Patient Logbook in the blue and white box.  Use locking tab on box and tape box closed securely.  The blue and white box has prepaid postage on it. Please place it in the mailbox  as soon as possible. Your physician should have your test results approximately 7 days after the monitor has been mailed back to [facility].  Call [place] at [phone] if you have questions regarding your ZIO XT patch monitor. Call them immediately if you see an orange light blinking on your monitor.  If your monitor falls off in less than 4 days, contact our Monitor department at [phone]. ?If your monitor becomes loose or falls off after 4 days call [employer] at [phone] for suggestions on securing your monitor.?  Follow-Up: At [facility], you and your health needs are our priority.  As part of our continuing mission to provide you with exceptional heart care, we have created designated Provider Care Teams.  These Care Teams include your primary Cardiologist (physician) and Advanced Practice Providers (APPs -  Physician Assistants and Nurse Practitioners) who all work together to provide you with the care you need, when you need it.  We recommend signing up for the patient portal called "MyChart".  Sign up information is provided on this After Visit Summary.  MyChart is used to connect with patients for Virtual Visits (Telemedicine).  Patients are able to view lab/test results, encounter notes, upcoming appointments, etc.  Non-urgent messages can be sent to your provider as well.   To learn more about what you can do with MyChart, go to [email or web address].    Your next appointment:   2 weeks with pharmacist 6 weeks with Dr. Gardiner Rhyme: 12/21 at 11:30 AM   Please check your blood pressure at home twice daily, write it down.  Call the office or send message via Mychart with the readings in 2 weeks for Dr. Gardiner Rhyme to review.

## 2021-03-28 DIAGNOSIS — I493 Ventricular premature depolarization: Secondary | ICD-10-CM

## 2021-03-28 DIAGNOSIS — I422 Other hypertrophic cardiomyopathy: Secondary | ICD-10-CM

## 2021-03-31 ENCOUNTER — Other Ambulatory Visit: Payer: Self-pay | Admitting: *Deleted

## 2021-03-31 DIAGNOSIS — I422 Other hypertrophic cardiomyopathy: Secondary | ICD-10-CM

## 2021-03-31 DIAGNOSIS — I10 Essential (primary) hypertension: Secondary | ICD-10-CM

## 2021-03-31 DIAGNOSIS — Z79899 Other long term (current) drug therapy: Secondary | ICD-10-CM

## 2021-04-05 ENCOUNTER — Other Ambulatory Visit: Payer: Self-pay

## 2021-04-06 ENCOUNTER — Other Ambulatory Visit: Payer: Self-pay

## 2021-04-06 LAB — LIPID PANEL
Chol/HDL Ratio: 3 ratio (ref 0.0–5.0)
Cholesterol, Total: 184 mg/dL (ref 100–199)
HDL: 61 mg/dL (ref >39)
LDL Chol Calc (NIH): 53 mg/dL (ref 0–99)
Triglycerides: 472 mg/dL — ABNORMAL HIGH (ref 0–149)
VLDL Cholesterol Cal: 70 mg/dL — ABNORMAL HIGH (ref 5–40)

## 2021-04-06 LAB — COMPREHENSIVE METABOLIC PANEL
ALT: 17 IU/L (ref 0–44)
AST: 14 IU/L (ref 0–40)
Albumin/Globulin Ratio: 2 (ref 1.2–2.2)
Albumin: 4.3 g/dL (ref 3.8–4.8)
Alkaline Phosphatase: 68 IU/L (ref 44–121)
BUN/Creatinine Ratio: 13 (ref 10–24)
BUN: 14 mg/dL (ref 8–27)
Bilirubin Total: 0.4 mg/dL (ref 0.0–1.2)
CO2: 27 mmol/L (ref 20–29)
Calcium: 9.9 mg/dL (ref 8.6–10.2)
Chloride: 99 mmol/L (ref 96–106)
Creatinine, Ser: 1.08 mg/dL (ref 0.76–1.27)
Globulin, Total: 2.2 g/dL (ref 1.5–4.5)
Glucose: 128 mg/dL — ABNORMAL HIGH (ref 70–99)
Potassium: 4.1 mmol/L (ref 3.5–5.2)
Sodium: 139 mmol/L (ref 134–144)
Total Protein: 6.5 g/dL (ref 6.0–8.5)
eGFR: 77 mL/min/{1.73_m2} (ref >59)

## 2021-04-06 LAB — MAGNESIUM: Magnesium: 2 mg/dL (ref 1.6–2.3)

## 2021-04-06 LAB — BRAIN NATRIURETIC PEPTIDE

## 2021-04-11 ENCOUNTER — Other Ambulatory Visit: Payer: Self-pay | Admitting: Internal Medicine

## 2021-04-12 ENCOUNTER — Other Ambulatory Visit: Payer: Self-pay

## 2021-04-13 NOTE — Progress Notes (Signed)
Patient ID: Joseph Harvey                 DOB: 1956/05/01                      MRN: [id number]      HPI: Joseph Harvey is a 65 y.o. male referred by Joseph Harvey to HTN clinic. PMH is significant for prostate cancer in remission, hypertrophic cardiomyopathy, T2DM, HTN, HLD, and tobacco use. Cardiac MRI 06/23/2020 showed asymmetric hypertrophy measuring up to 20 mm and basal anterior septum (8 mm and posterior wall), consistent with hypertrophic cardiomyopathy. Normal LV/RV size and systolic function. 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 Triamterene-HCTZ previously discontinued to avoid worsening of LVOT obstruction given pt's HCM, and valsartan was started. Pt was encouraged to monitor salt intake, elevate feet, wear compression stockings, weigh daily and to report if swelling does not resolve. Of note, in 2014 pulmonology discontinued ACEi and switched him to ARB due to concern for ACEi related bradykinins worsening his asthma/shortness of breath. Most recently seen by Joseph Joseph Harvey on 11/10. Pt having issues with SOB and LE edema. Using CPAP helped with fatigue. Wasn't checking BP at home, smoking 1/2 PPD, weight up 14 lbs in past 6 months. HCTZ was increased to 31m daily despite HCM due to volume overload on exam.   Pt presents today in good spirits. Reports tolerating his medications well. Swelling in his legs and feet has improved notably since restarting HCTZ and with recent dose increase. Weight down 10 lbs since last visit with Joseph Harvey. Overall feeling better from this but still reports SOB with activity. Avoids adding salt to his food, elevates his legs in the evening, and occasionally wears compression stockings. Home BP cuff previously brought to clinic when I saw pt earlier this year and was measuring accurately. Home BP readings have been 120s/80. Did have a higher reading 148/80s last night but on recheck a few minutes later systolic improved to 1024  Current HTN  meds:  Amlodipine 5 mg BID (50XB/3ZH Bystolic 20 mg BID (52DJ/2EQ Valsartan 3247mdaily (5am) HCTZ 2576maily (5am)  Previously tried:  Lisinopril - allergy listed: SOB; discontinued by pulmonologist, see note above Triamterene-HCTZ 37.5-25 mg daily - d/c due to HCM  BP goal: <130/96m48m Family History: Asthma in his mother; Colon cancer in his father; Diabetes in his maternal grandmother; Lung cancer in his father and mother; Prostate cancer in his father  Social History: Current every day smoker (trying to quit, currently down to 5 cigarettes/day), alcohol use  Diet: Denies adding salt to food. Eats eggs, yogurt, PB&J, tuna fish. Has cut out pizza, [place], [employer] [place]. Drinks a lot of water.  Exercise: Walking when able but SOB  Wt Readings from Last 3 Encounters:  03/25/21 252 lb 9.6 oz (114.6 kg)  03/10/21 248 lb (112.5 kg)  02/04/21 240 lb (108.9 kg)   BP Readings from Last 3 Encounters:  03/25/21 (!) 148/70  03/10/21 (!) 142/86  02/04/21 140/90   Pulse Readings from Last 3 Encounters:  03/25/21 97  03/10/21 95  02/04/21 84    Renal function: CrCl cannot be calculated (Unknown ideal weight.).  Past Medical History:  Diagnosis Date   Allergy    Dust, mold, dust mites   Anemia    Asthma    Cancer (HCC)Shannon prostate   Cataract    bilateral repair.   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 150 mL 2   albuterol (VENTOLIN HFA) 108 (90 Base) MCG/ACT inhaler INHALE 2 PUFFS INTO THE LUNGS EVERY 4 (FOUR) HOURS AS NEEDED FOR WHEEZING OR SHORTNESS  OF BREATH. 18 g 1   amLODipine (NORVASC) 5 MG tablet TAKE 1 TABLET (5 MG TOTAL) BY MOUTH 2 (TWO) TIMES DAILY. 180 tablet 3   ASPIRIN 81 PO Take 81 mg by mouth daily.     baclofen (LIORESAL) 10 MG tablet take 1 tablet by mouth 3 times a day 270 tablet 3   benzonatate (TESSALON) 100 MG capsule Take 1 capsule (100 mg total) by mouth every 8 (eight) hours. 21 capsule 0   Blood Glucose Monitoring Suppl (TRUE METRIX METER) w/Device KIT Use as directed 1 kit 0   Budeson-Glycopyrrol-Formoterol (BREZTRI AEROSPHERE) 160-9-4.8 MCG/ACT AERO Inhale 2 puffs into the lungs in the morning and at bedtime. 10.7 g 11   buPROPion (WELLBUTRIN XL) 150 MG 24 hr tablet TAKE 1 TABLET (150 MG TOTAL) BY MOUTH DAILY. 90 tablet 0   busPIRone (BUSPAR) 10 MG tablet Take 1 tablet (10 mg total) by mouth 3 (three) times daily. 90 tablet 3   Cholecalciferol (VITAMIN D PO) Take 1 tablet by mouth daily.     ezetimibe (ZETIA) 10 MG tablet Take 1 tablet (10 mg total) by mouth daily. 90 tablet 3   fluticasone (FLONASE) 50 MCG/ACT nasal spray PLACE 2 SPRAYS INTO BOTH NOSTRILS DAILY. 16 g 11   gabapentin (NEURONTIN) 300 MG capsule take 1 capsule by mouth in the morning, then take 1 at lunch and 2 at bedtime 360 capsule 3   glucose blood (TRUE METRIX BLOOD GLUCOSE TEST) test strip Use as instructed 100 each 12   hydrochlorothiazide (HYDRODIURIL) 25 MG tablet Take 1 tablet (25 mg total) by mouth daily. 90 tablet 3   HYDROcodone bit-homatropine (HYCODAN) 5-1.5 MG/5ML syrup Take 5 mLs by mouth every 6 (six) hours as needed for cough. 75 mL 0   ipratropium-albuterol (DUONEB) 0.5-2.5 (3) MG/3ML SOLN USE 1 VIAL VIA NEBULIZER TWICE A DAY 180 mL 2   linaclotide (LINZESS) 145 MCG CAPS capsule Take 1 capsule (145 mcg total) by mouth daily before breakfast. 90 capsule 1   LORazepam (ATIVAN) 1 MG tablet Take 1 tablet (53m) before cardiac MRI 1 tablet 0   metFORMIN (GLUCOPHAGE-XR) 500 MG 24 hr tablet TAKE 1 TABLET BY MOUTH DAILY. 90 tablet 1    montelukast (SINGULAIR) 10 MG tablet Take 1 tablet (10 mg total) by mouth at bedtime. 30 tablet 2   Nebivolol HCl 20 MG TABS Take 1 tablet (20 mg total) by mouth 2 (two) times daily. 90 tablet 5   pantoprazole (PROTONIX) 40 MG tablet Take 1 tablet (40 mg total) by mouth daily as needed. 90 tablet 1   polyethylene glycol powder (GLYCOLAX/MIRALAX) 17 GM/SCOOP powder Take 17 g by mouth daily as needed. 3350 g 1   potassium chloride SA (KLOR-CON) 20 MEQ tablet Take 20 mEq by mouth as needed. When hands cramp up     Respiratory Therapy Supplies (  FLUTTER) DEVI Use three times a day after inhaler or nebulizer use 1 each 0   rosuvastatin (CRESTOR) 40 MG tablet Take 1 tablet (40 mg total) by mouth daily. 90 tablet 3   TRUEplus Lancets 28G MISC Use as directed 100 each 4   valsartan (DIOVAN) 320 MG tablet Take 1 tablet (320 mg total) by mouth daily. 90 tablet 3   zolpidem (AMBIEN) 10 MG tablet Take upon arrival at sleep lab 1 tablet 0   No current facility-administered medications on file prior to visit.    Allergies  Allergen Reactions   Amoxicillin Anaphylaxis    Has patient had a PCN reaction causing immediate rash, facial/tongue/throat swelling, SOB or lightheadedness with hypotension: Yes Has patient had a PCN reaction causing severe rash involving mucus membranes or skin necrosis: No Has patient had a PCN reaction that required hospitalization: Yes Has patient had a PCN reaction occurring within the last 10 years: Yes If all of the above answers are "NO", then may proceed with Cephalosporin use.    Lisinopril Shortness Of Breath   Molds & Smuts Anaphylaxis   Robitussin [Guaifenesin] Shortness Of Breath    wheezing   Azithromycin Other (See Comments)     Assessment/Plan:  1. Hypertension - BP improved and now at goal <130/60mHg. HCTZ was increased to 262mdaily 2 weeks Harvey despite HCM due to volume overload on exam and 15 lb weight gain. Today, swelling has improved notably and pt has  lost 10 lbs since his last visit. Checking BMET, Mg and BNP toady per Joseph ScGardiner RhymeWill continue current BP meds including amlodipine 30m58mID, valsartan 320m24mily, HCTZ 230mg58mly, and Bystolic 20mg 41XJ Pt follows up with Joseph SchumGardiner Harvey weeks, can follow with PharmD as needed.   Nayomi Tabron E. Akil Hoos, PharmD, BCACP, CPP [place] [id number]: [phone]: [phone]/2022 4:45 PM

## 2021-04-14 ENCOUNTER — Ambulatory Visit (INDEPENDENT_AMBULATORY_CARE_PROVIDER_SITE_OTHER): Payer: 59 | Admitting: Pharmacist

## 2021-04-14 ENCOUNTER — Ambulatory Visit (HOSPITAL_COMMUNITY): Payer: 59 | Attending: Cardiology

## 2021-04-14 ENCOUNTER — Other Ambulatory Visit: Payer: Self-pay

## 2021-04-14 VITALS — BP 132/82 | HR 79 | Wt 242.4 lb

## 2021-04-14 DIAGNOSIS — I422 Other hypertrophic cardiomyopathy: Secondary | ICD-10-CM | POA: Insufficient documentation

## 2021-04-14 DIAGNOSIS — I10 Essential (primary) hypertension: Secondary | ICD-10-CM | POA: Diagnosis not present

## 2021-04-14 LAB — ECHOCARDIOGRAM COMPLETE
Area-P 1/2: 2.46 cm2
S' Lateral: 2.9 cm

## 2021-04-14 LAB — BASIC METABOLIC PANEL
BUN/Creatinine Ratio: 12 (ref 10–24)
BUN: 15 mg/dL (ref 8–27)
CO2: 29 mmol/L (ref 20–29)
Calcium: 10.7 mg/dL — ABNORMAL HIGH (ref 8.6–10.2)
Chloride: 97 mmol/L (ref 96–106)
Creatinine, Ser: 1.26 mg/dL (ref 0.76–1.27)
Glucose: 113 mg/dL — ABNORMAL HIGH (ref 70–99)
Potassium: 3.8 mmol/L (ref 3.5–5.2)
Sodium: 140 mmol/L (ref 134–144)
eGFR: 64 mL/min/{1.73_m2} (ref >59)

## 2021-04-14 LAB — MAGNESIUM: Magnesium: 2 mg/dL (ref 1.6–2.3)

## 2021-04-14 NOTE — Progress Notes (Unsigned)
Patient did not consent to the use of Definity.

## 2021-04-14 NOTE — Patient Instructions (Signed)
It was nice to see you today!  Your blood pressure is at goal < 130/12mmHg  Continue taking your current medications

## 2021-04-15 ENCOUNTER — Encounter: Payer: Self-pay | Admitting: Cardiovascular Disease

## 2021-04-15 ENCOUNTER — Ambulatory Visit (INDEPENDENT_AMBULATORY_CARE_PROVIDER_SITE_OTHER): Payer: 59 | Admitting: Cardiovascular Disease

## 2021-04-15 DIAGNOSIS — G4733 Obstructive sleep apnea (adult) (pediatric): Secondary | ICD-10-CM

## 2021-04-15 DIAGNOSIS — I10 Essential (primary) hypertension: Secondary | ICD-10-CM | POA: Diagnosis not present

## 2021-04-15 DIAGNOSIS — G629 Polyneuropathy, unspecified: Secondary | ICD-10-CM

## 2021-04-15 DIAGNOSIS — G4736 Sleep related hypoventilation in conditions classified elsewhere: Secondary | ICD-10-CM | POA: Diagnosis not present

## 2021-04-15 DIAGNOSIS — E1169 Type 2 diabetes mellitus with other specified complication: Secondary | ICD-10-CM

## 2021-04-15 DIAGNOSIS — I422 Other hypertrophic cardiomyopathy: Secondary | ICD-10-CM

## 2021-04-15 DIAGNOSIS — I493 Ventricular premature depolarization: Secondary | ICD-10-CM

## 2021-04-15 DIAGNOSIS — R931 Abnormal findings on diagnostic imaging of heart and coronary circulation: Secondary | ICD-10-CM

## 2021-04-15 DIAGNOSIS — Z720 Tobacco use: Secondary | ICD-10-CM

## 2021-04-15 DIAGNOSIS — E785 Hyperlipidemia, unspecified: Secondary | ICD-10-CM

## 2021-04-15 LAB — BRAIN NATRIURETIC PEPTIDE: BNP: 43.7 pg/mL (ref 0.0–100.0)

## 2021-04-15 NOTE — Patient Instructions (Signed)

## 2021-04-19 ENCOUNTER — Other Ambulatory Visit: Payer: Self-pay

## 2021-04-25 ENCOUNTER — Encounter: Payer: Self-pay | Admitting: Cardiovascular Disease

## 2021-04-25 NOTE — Progress Notes (Signed)
Cardiology Office Note    Date:  04/25/2021   ID:  Joseph Harvey, DOB 12/24/1955, MRN [id number]  PCP:  Ladell Pier, MD  Cardiologist:  Shelva Majestic, MD (sleep); Dr. Gardiner Rhyme  New sleep evaluation   History of Present Illness:  Joseph Harvey is a 65 y.o. male who is followed by Dr. Beatrix Fetters for his cardiology care.  He has a history of prostate cancer in remission, type 2 diabetes mellitus, hypertension, hyperlipidemia, and tobacco abuse.  He has been demonstrated to have severe septal hypertrophy with otherwise mild concentric LVH and mild intracavitary gradient with peak gradient 30 mm with normal biventricular function and grade 1 diastolic dysfunction.  He had normal perfusion on a Lexiscan Myoview study.  He has had documented episodes of several beats of NSVT on prior monitor.  A calcium score was 117 and on cardiac MRI findings were consistent with hypertrophic cardiomyopathy with asymmetric septal hypertrophy measuring 20 mm in the basal anterior septum and 8 mm in the posterior wall.  Due to concerns for obstructive sleep apnea with symptoms of snoring, awakening gasping for breath, frequent awakenings, nonrestorative sleep, and nocturia 2-3 times per night he was referred for an initial home sleep study on November 06, 2020.  This revealed severe sleep apnea with an overall AHI at 37.6/h.  He had absent supine sleep on that study.  There was significant oxygen desaturation to nadir 74%.  He subsequently underwent a CPAP titration on December 28, 2020 and was titrated only up to 10 cm of water.  AHI was still elevated at 10.8 and O2 nadir was 89%.  As result it was recommended he initiate CPAP auto therapy at a range of 11 to 20 cm of water.  He received a new ResMed air sense 11 AutoSet unit on January 19, 2021 with [employer] as his DME company.  His initial download from September 6 through February 17, 2021 showed that he was meeting compliance standards with 100% usage days with  average use at 5 hours and 53 minutes.  His 95th percentile pressure was 15.2.  He had moderate mask leak.  His subsequent download was obtained from November 1 through April 14, 2021.  He continues to meet compliance standards and now average use is improved at 7 hours and 31 minutes.  AHI is 2.9 but he has significant mask leak on a daily basis with 95th percentile leak at 78.7 with threshold at 24.0 L/min.  Since initiating CPAP therapy, Joseph Harvey sleeping is significantly improved.  He lives by himself.  He typically goes to bed around 7 PM but is up by 4 AM.  Presently he is unaware of breakthrough snoring.  His sleep is more restorative.  He does have occasional PVCs.  He denies any bruxism, hypnagogic hallucinations or cataplectic events.  He presents for his initial sleep evaluation evaluation.  Past Medical History:  Diagnosis Date   Allergy    Dust, mold, dust mites   Anemia    Asthma    Cancer ([place])    prostate   Cataract    bilateral repair.   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 150 mL 2   albuterol (VENTOLIN HFA) 108 (90 Base) MCG/ACT inhaler INHALE 2 PUFFS INTO THE LUNGS EVERY 4 (FOUR) HOURS AS NEEDED FOR WHEEZING OR SHORTNESS OF BREATH. 18 g 1   amLODipine (NORVASC) 5 MG  tablet TAKE 1 TABLET (5 MG TOTAL) BY MOUTH 2 (TWO) TIMES DAILY. 180 tablet 3   ASPIRIN 81 PO Take 81 mg by mouth daily.     baclofen (LIORESAL) 10 MG tablet take 1 tablet by mouth 3 times a day 270 tablet 3   benzonatate (TESSALON) 100 MG capsule Take 1 capsule (100 mg total) by mouth every 8 (eight) hours. 21 capsule 0   Blood Glucose Monitoring Suppl (TRUE METRIX METER) w/Device KIT Use as directed 1 kit 0   Budeson-Glycopyrrol-Formoterol (BREZTRI AEROSPHERE) 160-9-4.8 MCG/ACT AERO Inhale 2 puffs into the lungs in the morning and at bedtime. 10.7 g 11   buPROPion (WELLBUTRIN XL) 150 MG 24 hr tablet TAKE 1 TABLET (150 MG TOTAL) BY MOUTH DAILY. 90 tablet 0   busPIRone (BUSPAR) 10 MG tablet Take 1 tablet (10 mg total) by mouth 3 (three) times daily. 90 tablet 3   Cholecalciferol (VITAMIN D PO) Take 1 tablet by mouth daily.     ezetimibe (ZETIA) 10 MG tablet Take 1 tablet (10 mg total) by mouth daily. 90 tablet 3   fluticasone (FLONASE) 50 MCG/ACT nasal spray PLACE 2 SPRAYS INTO BOTH NOSTRILS DAILY. 16 g 11   gabapentin (NEURONTIN) 300 MG capsule take 1 capsule by mouth in the morning, then take 1 at lunch and 2 at bedtime 360 capsule 3   glucose blood (TRUE METRIX BLOOD GLUCOSE TEST) test strip Use as instructed 100 each 12   hydrochlorothiazide (HYDRODIURIL) 25 MG tablet Take 1 tablet (25 mg total) by mouth daily. 90 tablet 3   HYDROcodone bit-homatropine (HYCODAN) 5-1.5 MG/5ML syrup Take 5 mLs by mouth every 6 (six) hours as needed for cough. 75 mL 0   ipratropium-albuterol (DUONEB) 0.5-2.5 (3) MG/3ML SOLN USE 1 VIAL VIA NEBULIZER TWICE A DAY 180 mL 2   linaclotide (LINZESS) 145 MCG CAPS capsule Take 1 capsule (145 mcg total) by mouth daily before breakfast. 90 capsule 1   LORazepam (ATIVAN) 1 MG tablet Take 1 tablet (70m) before cardiac MRI 1 tablet 0   metFORMIN (GLUCOPHAGE-XR) 500 MG 24 hr tablet TAKE 1 TABLET BY MOUTH DAILY. 90 tablet 1   montelukast (SINGULAIR) 10 MG tablet Take 1 tablet (10  mg total) by mouth at bedtime. 30 tablet 2   Nebivolol HCl 20 MG TABS Take 1 tablet (20 mg total) by mouth 2 (two) times daily. 90 tablet 5   pantoprazole (PROTONIX) 40 MG tablet Take 1 tablet (40 mg total) by mouth daily as needed. 90 tablet 1   polyethylene glycol powder (GLYCOLAX/MIRALAX) 17 GM/SCOOP powder Take 17 g by mouth daily as needed. 3350 g 1   potassium chloride SA (KLOR-CON) 20 MEQ tablet Take 20 mEq by mouth as needed. When hands cramp up     Respiratory Therapy Supplies (FLUTTER) DEVI Use three  times a day after inhaler or nebulizer use 1 each 0   rosuvastatin (CRESTOR) 40 MG tablet Take 1 tablet (40 mg total) by mouth daily. 90 tablet 3   TRUEplus Lancets 28G MISC Use as directed 100 each 4   valsartan (DIOVAN) 320 MG tablet Take 1 tablet (320 mg total) by mouth daily. 90 tablet 3   zolpidem (AMBIEN) 10 MG tablet Take upon arrival at sleep lab 1 tablet 0   No facility-administered medications prior to visit.     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[employer]  Tobacco Use   Smoking status: Every Day    Packs/day: 0.50    Years: 46.00    Pack years: 23.00    Types: Cigarettes    Start date: 1976   Smokeless tobacco: Never   Tobacco comments:    still smoking 0.5 ppd  Vaping Use   Vaping Use: Never used  Substance and Sexual Activity   Alcohol use: Yes    Alcohol/week: 3.0 standard drinks    Types: 3 Cans of beer per week    Comment: rarely   Drug use: No   Sexual activity: Not Currently  Other Topics Concern   Not on file  Social History Narrative   Not on file   Social Determinants of Health   Financial Resource Strain: Not on file  Food  Insecurity: Not on file  Transportation Needs: Not on file  Physical Activity: Not on file  Stress: No Stress Concern Present   Feeling of Stress : Only a little  Social Connections: Not on [employer], he was born in [place], [place] and lived in [place] and [place].  He is divorced and widowed.  He has 2 daughters ages 64 and 56, and 5 grandchildren.  He previously had driven a cab.  Family History:  The patient's family history includes Allergies in his brother; Asthma in his mother; Colon cancer in his father; Diabetes in his maternal grandmother; Lung cancer in his father and mother; Prostate cancer in his father.  Father died at age 31 with cancer.  Mother died at age 35 with lung cancer.   ROS General: Negative; No fevers, chills, or night sweats;  HEENT: Negative; No changes in vision or hearing, sinus congestion, difficulty swallowing; dentures, with a partial on the bottom Pulmonary: Negative; No cough, wheezing, shortness of breath, hemoptysis Cardiovascular: Hypertrophic cardiomyopathy, PVCs, with previously documented short episodes of NSVT GI: Negative; No nausea, vomiting, diarrhea, or abdominal pain GU: Negative; No dysuria, hematuria, or difficulty voiding Musculoskeletal: Negative; no myalgias, joint pain, or weakness Hematologic/Oncology: Negative; no easy bruising, bleeding Endocrine: Negative; no heat/cold intolerance; no diabetes Neuro: 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   PHYSICAL EXAM:   VS:  BP 130/70   Pulse 95   Ht '5\' 9"'  (1.753 m)   Wt 244 lb 6.4 oz (110.9 kg)   SpO2 96%   BMI 36.09 kg/m     Repeat blood pressure by me was 122/74  Wt Readings from Last 3 Encounters:  04/15/21 244 lb 6.4 oz (110.9 kg)  04/14/21 242 lb 6 oz (109.9 kg)  03/25/21 252 lb 9.6 oz (114.6 kg)     General: Alert, oriented, no distress.  Skin: normal turgor, no rashes, warm and dry HEENT: Normocephalic, atraumatic. Pupils equal round and reactive to light; sclera anicteric; extraocular muscles intact;  Nose without nasal septal hypertrophy Mouth/Parynx benign; Mallinpatti scale 4 Neck: No JVD, no carotid bruits; normal carotid upstroke Lungs: clear to ausculatation and percussion; no wheezing or rales Chest wall: without tenderness to palpitation Heart: PMI not displaced, RRR, s1 s2 normal, 1/6 systolic murmur, no diastolic murmur, no rubs, gallops, thrills, or heaves Abdomen: soft, nontender; no hepatosplenomehaly, BS+; abdominal aorta nontender and not dilated by palpation. Back: no CVA tenderness Pulses 2+ Musculoskeletal: full range of motion, normal strength, no joint deformities Extremities: no clubbing cyanosis or edema, Homan's sign negative  Neurologic: grossly nonfocal; Cranial nerves grossly wnl Psychologic: Normal mood and affect   Studies/Labs Reviewed:   EKG:  EKG is not ordered today.  I personally reviewed the ECG from March 25, 2021 which showed sinus rhythm with frequent PVCs in a transient trigeminal pattern.  Recent Labs: BMP Latest Ref Rng & Units 04/14/2021 03/25/2021 09/17/2020  Glucose 70 - 99 mg/dL 113(H) 128(H) 172(H)  BUN 8 - 27 mg/dL '15 14 17  ' Creatinine 0.76 - 1.27 mg/dL 1.26 1.08 1.32(H)  BUN/Creat Ratio 10 - '24 12 13 13  ' Sodium 134 - 144 mmol/L 140 139 140  Potassium 3.5 - 5.2 mmol/L 3.8 4.1 4.9  Chloride 96 - 106 mmol/L 97 99 101  CO2 20 - 29 mmol/L '29 27 21  ' Calcium 8.6 - 10.2 mg/dL 10.7(H) 9.9 10.0     Hepatic Function Latest Ref Rng & Units 03/25/2021 04/14/2020 11/15/2019  Total Protein 6.0 - 8.5 g/dL 6.5 7.3 7.7  Albumin 3.8 - 4.8 g/dL 4.3 4.6 5.0(H)  AST 0 - 40 IU/L '14 16 21  ' ALT 0 - 44 IU/L '17 19 21  ' Alk Phosphatase 44 - 121 IU/L 68 76 67  Total Bilirubin 0.0 - 1.2 mg/dL 0.4 0.5 0.5    CBC Latest Ref Rng & Units 02/26/2020  11/15/2019 06/20/2018  WBC 4.0 - 10.5 K/uL 8.7 10.7 6.7  Hemoglobin 13.0 - 17.0 g/dL 16.4 16.0 16.4  Hematocrit 39.0 - 52.0 % 46.8 47.8 48.2  Platelets 150.0 - 400.0 K/uL 387.0 361 332   Lab Results  Component Value Date   MCV 93.8 02/26/2020   MCV 93 11/15/2019   MCV 92.0 06/20/2018   No results found for: TSH Lab Results  Component Value Date   HGBA1C 6.6 02/04/2021     BNP    Component Value Date/Time   BNP 43.7 04/14/2021 0855   BNP 25.1 07/24/2014 1256    ProBNP No results found for: PROBNP   Lipid Panel     Component Value Date/Time   CHOL 184 03/25/2021 0926   TRIG 472 (H) 03/25/2021 0926   HDL 61 03/25/2021 0926   CHOLHDL 3.0 03/25/2021 0926   LDLCALC 53 03/25/2021 0926   LABVLDL 70 (H) 03/25/2021 0926     RADIOLOGY: ECHOCARDIOGRAM COMPLETE  Result Date: 04/14/2021    ECHOCARDIOGRAM REPORT   Patient Name:   Joseph Harvey  Date of Exam: 04/14/2021 Medical Rec #:  [id number]     Height:       69.0 in Accession #:    [id number]    Weight:  252.6 lb Date of Birth:  1956/04/23     BSA:          2.282 m Patient Age:    31 years      BP:           148/70 mmHg Patient Gender: M             HR:           83 bpm. Exam Location:  [address] Procedure: 2D Echo, Cardiac Doppler and Color Doppler Indications:    I42.1 Cardiomyopathy-Hypertrophic  History:        Patient has prior history of Echocardiogram examinations, most                 recent 03/18/2020. Risk Factors:Hypertension, Diabetes and                 Dyslipidemia. Asthma. Morbid obesity. Low back pain. Tobacco                 dependence.  Sonographer:    Diamond Nickel RCS Referring Phys: Oswaldo Milian, L  Sonographer Comments: Patient did not consent to the use of Definity. IMPRESSIONS  1. Left ventricular ejection fraction, by estimation, is 60 to 65%. The left ventricle has normal function. The left ventricle has no regional wall motion abnormalities. There is severe asymmetric left ventricular  hypertrophy of the basal-septal segment. No LVOT obstruction. Left ventricular diastolic parameters are consistent with Grade I diastolic dysfunction (impaired relaxation).  2. Right ventricular systolic function is normal. The right ventricular size is normal. Tricuspid regurgitation signal is inadequate for assessing PA pressure.  3. The mitral valve is normal in structure. Trivial mitral valve regurgitation.  4. The aortic valve is tricuspid. Aortic valve regurgitation is not visualized. No aortic stenosis is present.  5. The inferior vena cava is normal in size with greater than 50% respiratory variability, suggesting right atrial pressure of 3 mmHg. FINDINGS  Left Ventricle: Left ventricular ejection fraction, by estimation, is 60 to 65%. The left ventricle has normal function. The left ventricle has no regional wall motion abnormalities. The left ventricular internal cavity size was normal in size. There is  severe asymmetric left ventricular hypertrophy of the basal-septal segment. Left ventricular diastolic parameters are consistent with Grade I diastolic dysfunction (impaired relaxation). Right Ventricle: The right ventricular size is normal. No increase in right ventricular wall thickness. Right ventricular systolic function is normal. Tricuspid regurgitation signal is inadequate for assessing PA pressure. Left Atrium: Left atrial size was normal in size. Right Atrium: Right atrial size was normal in size. Pericardium: There is no evidence of pericardial effusion. Mitral Valve: The mitral valve is normal in structure. Trivial mitral valve regurgitation. Tricuspid Valve: The tricuspid valve is normal in structure. Tricuspid valve regurgitation is trivial. Aortic Valve: The aortic valve is tricuspid. Aortic valve regurgitation is not visualized. No aortic stenosis is present. Pulmonic Valve: The pulmonic valve was not well visualized. Pulmonic valve regurgitation is trivial. Aorta: The aortic root and  ascending aorta are structurally normal, with no evidence of dilitation. Venous: The inferior vena cava is normal in size with greater than 50% respiratory variability, suggesting right atrial pressure of 3 mmHg. IAS/Shunts: The interatrial septum was not well visualized.  LEFT VENTRICLE PLAX 2D LVIDd:         4.30 cm   Diastology LVIDs:         2.90 cm   LV e' medial:    7.07 cm/s LV PW:  1.10 cm   LV E/e' medial:  8.1 LV IVS:        1.50 cm   LV e' lateral:   7.40 cm/s LVOT diam:     2.07 cm   LV E/e' lateral: 7.7 LV SV:         74 LV SV Index:   33 LVOT Area:     3.35 cm  RIGHT VENTRICLE RV Basal diam:  2.70 cm RV S prime:     13.20 cm/s TAPSE (M-mode): 2.8 cm LEFT ATRIUM             Index        RIGHT ATRIUM           Index LA diam:        3.60 cm 1.58 cm/m   RA Area:     13.20 cm LA Vol (A2C):   46.8 ml 20.51 ml/m  RA Volume:   37.10 ml  16.26 ml/m LA Vol (A4C):   74.0 ml 32.43 ml/m LA Biplane Vol: 63.8 ml 27.96 ml/m  AORTIC VALVE LVOT Vmax:   108.00 cm/s LVOT Vmean:  67.300 cm/s LVOT VTI:    0.222 m MITRAL VALVE MV Area (PHT): 2.46 cm    SHUNTS MV Decel Time: 308 msec    Systemic VTI:  0.22 m MV E velocity: 57.10 cm/s  Systemic Diam: 2.07 cm MV A velocity: 87.50 cm/s MV E/A ratio:  0.65 Oswaldo Milian MD Electronically signed by Oswaldo Milian MD Signature Date/Time: 04/14/2021/9:53:02 AM    Final      Additional studies/ records that were reviewed today include:     11/06/2020 CLINICAL INFORMATION Sleep Study Type: HST   Indication for sleep study: snoring, obesity,    Epworth Sleepiness Score: 5   SLEEP STUDY TECHNIQUE A multi-channel overnight portable sleep study was performed. The channels recorded were: nasal airflow, thoracic respiratory movement, and oxygen saturation with a pulse oximetry. Snoring was also monitored.   MEDICATIONS ezetimibe (ZETIA) 10 MG tablet albuterol (PROVENTIL) (2.5 MG/3ML) 0.083% nebulizer solution albuterol (VENTOLIN HFA) 108 (90  Base) MCG/ACT inhaler amLODipine (NORVASC) 5 MG tablet ASPIRIN 81 PO baclofen (LIORESAL) 10 MG tablet Blood Glucose Monitoring Suppl (TRUE METRIX METER) w/Device KIT Budeson-Glycopyrrol-Formoterol (BREZTRI AEROSPHERE) 160-9-4.8 MCG/ACT AERO Budeson-Glycopyrrol-Formoterol (BREZTRI AEROSPHERE) 160-9-4.8 MCG/ACT AERO buPROPion (WELLBUTRIN XL) 150 MG 24 hr tablet busPIRone (BUSPAR) 10 MG tablet Cholecalciferol (VITAMIN D PO) fluticasone (FLONASE) 50 MCG/ACT nasal spray gabapentin (NEURONTIN) 300 MG capsule glucose blood (TRUE METRIX BLOOD GLUCOSE TEST) test strip ipratropium-albuterol (DUONEB) 0.5-2.5 (3) MG/3ML SOLN LORazepam (ATIVAN) 1 MG tablet metFORMIN (GLUCOPHAGE-XR) 500 MG 24 hr tablet montelukast (SINGULAIR) 10 MG tablet Nebivolol HCl 20 MG TABS pantoprazole (PROTONIX) 40 MG tablet polyethylene glycol powder (GLYCOLAX/MIRALAX) 17 GM/SCOOP powder potassium chloride SA (KLOR-CON) 20 MEQ tablet Respiratory Therapy Supplies (FLUTTER) DEVI rosuvastatin (CRESTOR) 40 MG tablet TRUEplus Lancets 28G MISC valsartan (DIOVAN) 320 MG tablet Patient self administered medications include: N/A.   SLEEP ARCHITECTURE Patient was studied for 453.3 minutes. The sleep efficiency was 99.9 % and the patient was supine for 0%. The arousal index was 0.0 per hour.   RESPIRATORY PARAMETERS The overall AHI was 37.6 per hour, with a central apnea index of 0 per hour. Supine sleep was absent.   The oxygen nadir was 74% during sleep.   CARDIAC DATA Mean heart rate during sleep was 78.3 bpm.   IMPRESSIONS - Severe obstructive sleep apnea occurred during this study (AHI 37.6/h).  - Severe oxygen desaturation to a  nadir of 74%. - Patient snored 35.7% during the sleep.   DIAGNOSIS - Obstructive Sleep Apnea (G47.33) - Nocturnal Hypoxemia (G47.36)   RECOMMENDATIONS - In this patient with significant cardiovascular co-morbidities with severe sleep apnea and nocturnal oxygen desaturation recommend an  in-lab CPAP titration study. - Effort should be made to optimize nasal and oropharyngeal patency. - Avoid alcohol, sedatives and other CNS depressants that may worsen sleep apnea and disrupt normal sleep architecture. - Sleep hygiene should be reviewed to assess factors that may improve sleep quality. - Weight management and regular exercise should be initiated or continued. - Recommend a download after 30 days and sleep clinic evaluation after one month of therapy.     12/28/2020 CLINICAL INFORMATION The patient is referred for a CPAP titration to treat sleep apnea.   Date of HST:  11/06/2020:  AHI 37.6/h; O2 nadir 74%.   SLEEP STUDY TECHNIQUE As per the AASM Manual for the Scoring of Sleep and Associated Events v2.3 (April 2016) with a hypopnea requiring 4% desaturations.   The channels recorded and monitored were frontal, central and occipital EEG, electrooculogram (EOG), submentalis EMG (chin), nasal and oral airflow, thoracic and abdominal wall motion, anterior tibialis EMG, snore microphone, electrocardiogram, and pulse oximetry. Continuous positive airway pressure (CPAP) was initiated at the beginning of the study and titrated to treat sleep-disordered breathing.   MEDICATIONS ezetimibe (ZETIA) 10 MG tablet (Expired) albuterol (PROVENTIL) (2.5 MG/3ML) 0.083% nebulizer solution albuterol (VENTOLIN HFA) 108 (90 Base) MCG/ACT inhaler amLODipine (NORVASC) 5 MG tablet ASPIRIN 81 PO baclofen (LIORESAL) 10 MG tablet Blood Glucose Monitoring Suppl (TRUE METRIX METER) w/Device KIT Budeson-Glycopyrrol-Formoterol (BREZTRI AEROSPHERE) 160-9-4.MCG/ACT AERO buPROPion (WELLBUTRIN XL) 150 MG 24 hr tablet busPIRone (BUSPAR) 10 MG tablet Cholecalciferol (VITAMIN D PO) fluticasone (FLONASE) 50 MCG/ACT nasal spray gabapentin (NEURONTIN) 300 MG capsule gabapentin (NEURONTIN) 300 MG capsule glucose blood (TRUE METRIX BLOOD GLUCOSE TEST) test strip ipratropium-albuterol (DUONEB) 0.5-2.5 (3) MG/3ML  SOLN LORazepam (ATIVAN) 1 MG tablet metFORMIN (GLUCOPHAGE-XR) 500 MG 24 hr tablet montelukast (SINGULAIR) 10 MG tablet Nebivolol HCl 20 MG TABS pantoprazole (PROTONIX) 40 MG tablet polyethylene glycol powder (GLYCOLAX/MIRALAX) 17 GM/SCOOP powder potassium chloride SA (KLOR-CON) 20 MEQ tablet Respiratory Therapy Supplies (FLUTTER) DEVI rosuvastatin (CRESTOR) 40 MG tablet TRUEplus Lancets 28G MISC valsartan (DIOVAN) 320 MG tablet zolpidem (AMBIEN) 10 MG tablet Medications self-administered by patient taken the night of the study : N/A   TECHNICIAN COMMENTS Comments added by technician: CPAP therapy started at 4 cm of H20 and increased to 10 cm of H2O due to events in REM stages. Suboptimal pressure obtained due to hyponiec events noticed in REM stages but there were no significant O2 desats. Patient tolerated CPAP fairly well. Patient took one Ambien of 10 mg prior to lights off time. EKG = Frequent PVC's noticed Comments added by scorer: N/A   RESPIRATORY PARAMETERS Optimal PAP Pressure (cm):   AHI at Optimal Pressure (/hr):            N/A Overall Minimal O2 (%):         81.00   Supine % at Optimal Pressure (%):    N/A Minimal O2 at Optimal Pressure (%): 81.00      SLEEP ARCHITECTURE The study was initiated at 10:22:36 PM and ended at 4:19:30 AM.   Sleep onset time was 5.8 minutes and the sleep efficiency was 69.9%. The total sleep time was 249.5 minutes.   The patient spent 9.22% of the night in stage N1 sleep, 66.93% in stage N2 sleep,  10.42% in stage N3 and 13.4% in REM.Stage REM latency was 95.5 minutes   Wake after sleep onset was 101.6. Alpha intrusion was absent. Supine sleep was 90.98%.   CARDIAC DATA The 2 lead EKG demonstrated sinus rhythm. The mean heart rate was 72.60 beats per minute. Other EKG findings include: PVCs.   LEG MOVEMENT DATA The total Periodic Limb Movements of Sleep (PLMS) were 0. The PLMS index was 0.00. A PLMS index of <15 is considered normal in  adults.   IMPRESSIONS - CPAP was initiated at 4 cm and was only titrated to 10 cm of water (AHI 10.8; O2 nadir 89%).  An optimal PAP pressure could not be selected for this patient based on the available study data. - Moderate oxygen desaturations were observed during this titration to a nadir of  81% at5 cm.  . - The patient snored with moderate snoring volume during this titration study. - 2-lead EKG demonstrated: PVCs - Clinically significant periodic limb movements were not noted during this study. Arousals associated with PLMs were rare.   DIAGNOSIS - Obstructive Sleep Apnea (G47.33)   RECOMMENDATIONS - Recommend an initial trial of CPAP Auto with EPR of 3 at 11-20 cm of water with heated humidification.  - Effort should be made to optimize nasal and oropharyngeal patency.  - Avoid alcohol, sedatives and other CNS depressants that may worsen sleep apnea and disrupt normal sleep architecture. - Sleep hygiene should be reviewed to assess factors that may improve sleep quality. - Weight management (BMI 35) and regular exercise should be initiated or continued. - Recommend a download in 30 days and sleep clinic evaluation after 4 weeks of therapy.   ASSESSMENT:    1. Obstructive sleep apnea   2. Hypoxemia associated with sleep   3. Primary hypertension   4. Hypertrophic cardiomyopathy ([place])   5. Agatston coronary artery calcium score between 100 and 199   6. Hyperlipidemia associated with type 2 diabetes mellitus ([place])   7. PVC's (premature ventricular contractions)   8. Tobacco use   9. Neuropathy      PLAN:  Joseph Harvey is a 65 year old African-American gentleman has a history of hypertrophic cardiomyopathy with mild intracavity gradient with a peak gradient of 30 mmHg, as well as a history of type 2 diabetes mellitus, hypertension, documented PVCs with palpitations, tobacco abuse and prostate cancer in remission.  He had had poor sleep with loud snoring, frequent  awakenings, nonrestorative sleep, as well as nocturia occurring 2-3 times per night.  He was found to have severe obstructive sleep apnea on a home study and subsequently underwent CPAP titration.  Since initiating CPAP therapy on January 19, 2021, he has noted improvement in his sleep.  His initial download was obtained from September 6 through February 17, 2021 where usage was 100% but at that time average use was just shy of 6 hours at 5 hours and 53 minutes.  AHI was 3.9 with a pressure range of 11-20.  On his most recent download from November 2022, he has now gotten used to CPAP and is sleeping 7-1/2 hours on average per night.  AHI is 2.9 which I suspect is contributing by significant mask leak.  Previously he had significant symptoms which have improved and presently he is unaware of any breakthrough snoring, or significant residual daytime sleepiness.  His nocturia has improved.  I had a lengthy discussion with him today regarding sleep apnea.  I discussed normal sleep architecture and the effects of untreated sleep  apnea causing disruptive sleep architecture.  In addition I discussed its effects on blood pressure control resulting from increased sympathetic tone due to arousals often preventing the typical diastolic dip seen with a parasympathetic response with sleep, increased risk for nocturnal arrhythmias including atrial fibrillation, increased potential inflammation, effects on blood glucose, GERD, and significant nocturnal hypoxemia potentially contributing to both cardiac as well as cerebrovascular ischemia.  He has had frequent PVCs noted previously and on a prior 3-day monitor he had 7 episodes of nonsustained VT.  He has mild coronary calcification with a calcium score 117 representing 81st percentile done on April 29, 2020.  I discussed different mask options with him.  I am recommending a trial of the ResMed AirFit F 30i mask which I believe he may tolerate better than his current fullface  mask.  He sleeps without his dentures and has partial dentures on the bottom.  I am changing his initial ramp time down to 15 cm of water and increasing his start pressure at 6.  I will change his pressure settings to 11 is 16 cm of water.  Presently today his blood pressure is controlled and a repeat by me was 122/74.  He continues to be on amlodipine 5 mg which he takes twice a day, HCTZ 25 mg daily, valsartan 320 mg, and nebivolol 20 mg twice a day for blood pressure and rhythm control.  He is on Zetia and rosuvastatin 40 mg for hyperlipidemia with target LDL less than 70.  He is diabetic on metformin,.  Presently there is no wheezing and he is on [employer], DuoNeb, montelukast, and has as needed albuterol.  He has issues with his spine and has neuropathy of his feet resulting in reduced walking for which she takes gabapentin.  BMI is 36.09 consistent with class II obesity.  We discussed the importance of weight loss and increased exercise.    Time spent: 40 minutes Medication Adjustments/Labs and Tests Ordered: Current medicines are reviewed at length with the patient today.  Concerns regarding medicines are outlined above.  Medication changes, Labs and Tests ordered today are listed in the Patient Instructions below. Patient Instructions  Medication Instructions:  The current medical regimen is effective;  continue present plan and medications.  *If you need a refill on your cardiac medications before your next appointment, please call your pharmacy*  Follow-Up: At [facility], you and your health needs are our priority.  As part of our continuing mission to provide you with exceptional heart care, we have created designated Provider Care Teams.  These Care Teams include your primary Cardiologist (physician) and Advanced Practice Providers (APPs -  Physician Assistants and Nurse Practitioners) who all work together to provide you with the care you need, when you need it.  We  recommend signing up for the patient portal called "MyChart".  Sign up information is provided on this After Visit Summary.  MyChart is used to connect with patients for Virtual Visits (Telemedicine).  Patients are able to view lab/test results, encounter notes, upcoming appointments, etc.  Non-urgent messages can be sent to your provider as well.   To learn more about what you can do with MyChart, go to [email or web address].    Your next appointment:   12 month(s)  The format for your next appointment:   In Person  Provider:   Shelva Majestic, MD (sleep)     Signed, Shelva Majestic, MD  04/25/2021 12:33 PM    [facility] [address],  [address], [place], [place]  [address] Phone: [phone]

## 2021-04-26 ENCOUNTER — Other Ambulatory Visit: Payer: Self-pay | Admitting: *Deleted

## 2021-04-26 DIAGNOSIS — I493 Ventricular premature depolarization: Secondary | ICD-10-CM

## 2021-04-28 ENCOUNTER — Encounter: Payer: Self-pay | Admitting: Physician Assistant

## 2021-04-30 ENCOUNTER — Other Ambulatory Visit: Payer: Self-pay

## 2021-04-30 ENCOUNTER — Other Ambulatory Visit: Payer: Self-pay | Admitting: Internal Medicine

## 2021-04-30 MED ORDER — ALBUTEROL SULFATE (2.5 MG/3ML) 0.083% IN NEBU
2.5000 mg | INHALATION_SOLUTION | RESPIRATORY_TRACT | 0 refills | Status: DC | PRN
  Filled 2021-04-30: qty 150, 9d supply, fill #0

## 2021-04-30 MED ORDER — ALBUTEROL SULFATE HFA 108 (90 BASE) MCG/ACT IN AERS
2.0000 | INHALATION_SPRAY | RESPIRATORY_TRACT | 0 refills | Status: DC | PRN
Start: 2021-04-30 — End: 2021-06-06
  Filled 2021-04-30: qty 18, 16d supply, fill #0

## 2021-05-02 NOTE — Progress Notes (Signed)
Cardiology Office Note:    Date:  05/06/2021   ID:  Joseph Harvey, DOB 04-06-1956, MRN [id number]  PCP:  Ladell Pier, MD  Cardiologist:  Donato Heinz, MD  Electrophysiologist:  None   Referring MD: Ladell Pier, MD   Chief Complaint  Patient presents with   Cardiomyopathy    History of Present Illness:    Joseph Harvey is a 65 y.o. male with a hx of prostate cancer in remission, T2DM, hypertension, hyperlipidemia, [place] use who presents for follow-up.  He was referred by Dr. Wynetta Emery for evaluation of dyspnea on exertion, intially seen on 04/14/20.  Hospital he reports that he has been having chest pain with minimal exertion.  Also describes having chest pain.  Reports sharp left-sided chest pain, can occur at rest or with exertion.  Typically last for 10 minutes or so.  Also states he has been having occasional lightheadedness, denies any syncope.  Denies any lower extremity edema.  Reports BP has been anywhere from 110s to 160s when he checks at home.  Reports he had a prior test for sleep apnea which was negative.  He has smoked since age 55, less than 1 pack/day.  No known history of heart disease in his immediate family.  Echocardiogram on 03/18/2020 showed severe septal hypertrophy with otherwise mild concentric hypertrophy, mild intracavitary gradient (peak gradient 30 mmHg), normal biventricular function, grade 1 diastolic dysfunction, no significant valvular disease.  Lexiscan Myoview on 04/29/2020 showed normal perfusion, EF 57%.  Preventives monitor x3 days on 05/06/2020 showed 7 episodes of NSVT, longest lasting 3 beats, occasional PVCs (3.9% of beats).  Calcium score 117 on 04/29/2020 (81st percentile).  Cardiac MRI 06/23/2020 showed asymmetric hypertrophy measuring up to 20 mm in basal anterior septum (8 mm and posterior wall), consistent with hypertrophic cardiomyopathy.  Also with patchy LGE in basal septum and RV insertion sites consistent with HCM; LGE  accounts for 11% of total myocardial mass.  Normal LV/RV size and systolic function.  Echo on 04/14/2021 showed EF of 60 to 65%, severe asymmetric hypertrophy of basal septum, no LVOT obstruction, grade 1 diastolic dysfunction, normal RV function, no significant valvular disease.  Since last clinic visit, he reports that he has been doing better.  Weight is down 10 pounds from last visit and swelling significantly improved.  He continues to have some dyspnea with exertion.  Does report some chest pain with eating, but no exertional chest pain.  Denies any lightheadedness, syncope, or lower extremity edema.  Has not been exercising.  He continues to smoke about 0.5 packs/day.   BP Readings from Last 3 Encounters:  05/05/21 122/72  04/15/21 130/70  04/14/21 132/82    Wt Readings from Last 3 Encounters:  05/05/21 243 lb 6.4 oz (110.4 kg)  04/15/21 244 lb 6.4 oz (110.9 kg)  04/14/21 242 lb 6 oz (109.9 kg)     Past Medical History:  Diagnosis Date   Allergy    Dust, mold, dust mites   Anemia    Asthma    Cancer ([place])    prostate   Cataract    bilateral repair.   COVID    Diabetes mellitus without complication ([place])    Glaucoma    Hyperlipidemia    Hypertension    Neuromuscular disorder ([place])    nerve damage from back surgery   Pneumonia     Past Surgical History:  Procedure Laterality Date   BACK SURGERY  2017   CATARACT EXTRACTION Bilateral  COLONOSCOPY     2013   COLONOSCOPY  10/03/2019   KNEE ARTHROSCOPY  2005   right   PROSTATE SURGERY     ROBOT ASSISTED LAPAROSCOPIC RADICAL PROSTATECTOMY  03/2010    Current Medications: Current Meds  Medication Sig   albuterol (PROVENTIL) (2.5 MG/3ML) 0.083% nebulizer solution Take 3 mLs (2.5 mg total) by nebulization every 4 (four) hours as needed for wheezing or shortness of breath (((PLAN B))).   albuterol (VENTOLIN HFA) 108 (90 Base) MCG/ACT inhaler INHALE 2 PUFFS INTO THE LUNGS EVERY 4 (FOUR) HOURS AS NEEDED FOR WHEEZING  OR SHORTNESS OF BREATH.   amLODipine (NORVASC) 5 MG tablet TAKE 1 TABLET (5 MG TOTAL) BY MOUTH 2 (TWO) TIMES DAILY.   ASPIRIN 81 PO Take 81 mg by mouth daily.   baclofen (LIORESAL) 10 MG tablet take 1 tablet by mouth 3 times a day   Blood Glucose Monitoring Suppl (TRUE METRIX METER) w/Device KIT Use as directed   Budeson-Glycopyrrol-Formoterol (BREZTRI AEROSPHERE) 160-9-4.8 MCG/ACT AERO Inhale 2 puffs into the lungs in the morning and at bedtime.   buPROPion (WELLBUTRIN XL) 150 MG 24 hr tablet TAKE 1 TABLET (150 MG TOTAL) BY MOUTH DAILY.   busPIRone (BUSPAR) 10 MG tablet Take 1 tablet (10 mg total) by mouth 3 (three) times daily.   Cholecalciferol (VITAMIN D PO) Take 1 tablet by mouth daily.   ezetimibe (ZETIA) 10 MG tablet Take 1 tablet (10 mg total) by mouth daily.   fluticasone (FLONASE) 50 MCG/ACT nasal spray PLACE 2 SPRAYS INTO BOTH NOSTRILS DAILY.   glucose blood (TRUE METRIX BLOOD GLUCOSE TEST) test strip Use as instructed   hydrochlorothiazide (HYDRODIURIL) 25 MG tablet Take 1 tablet (25 mg total) by mouth daily.   ipratropium-albuterol (DUONEB) 0.5-2.5 (3) MG/3ML SOLN USE 1 VIAL VIA NEBULIZER TWICE A DAY   linaclotide (LINZESS) 145 MCG CAPS capsule Take 1 capsule (145 mcg total) by mouth daily before breakfast.   metFORMIN (GLUCOPHAGE-XR) 500 MG 24 hr tablet TAKE 1 TABLET BY MOUTH DAILY.   montelukast (SINGULAIR) 10 MG tablet Take 1 tablet (10 mg total) by mouth at bedtime.   Nebivolol HCl 20 MG TABS Take 1 tablet (20 mg total) by mouth 2 (two) times daily.   pantoprazole (PROTONIX) 40 MG tablet Take 1 tablet (40 mg total) by mouth daily as needed.   predniSONE (DELTASONE) 20 MG tablet prednisone 20 mg tablet  TAKE 3 TABS BY MOUTH ONCE DAILY FOR 3 DAYS, 2 TABS DAILY FOR 3 DAYS, THEN 1 TAB DAILY FOR 3 DAYS   Respiratory Therapy Supplies (FLUTTER) DEVI Use three times a day after inhaler or nebulizer use   rosuvastatin (CRESTOR) 40 MG tablet Take 1 tablet (40 mg total) by mouth daily.    TRUEplus Lancets 28G MISC Use as directed   valsartan (DIOVAN) 320 MG tablet Take 1 tablet (320 mg total) by mouth daily.   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[employer] [place]  Tobacco Use   Smoking status: Every Day    Packs/day: 0.50    Years: 46.00  Pack years: 23.00    Types: Cigarettes    Start date: 1976   Smokeless tobacco: Never   Tobacco comments:    still smoking 0.5 ppd  Vaping Use   Vaping Use: Never used  Substance and Sexual Activity   Alcohol use: Yes    Alcohol/week: 3.0 standard drinks    Types: 3 Cans of beer per week    Comment: rarely   Drug use: No   Sexual activity: Not Currently  Other Topics Concern   Not on file  Social History Narrative   Not on file   Social Determinants of Health   Financial Resource Strain: Not on file  Food Insecurity: Not on file  Transportation Needs: Not on file  Physical Activity: Not on file  Stress: No Stress Concern Present   Feeling of Stress : Only a little  Social Connections: Not on file     Family History: The patient's family history includes Allergies in his brother; Asthma in his mother; Colon cancer in his father; Diabetes in his maternal grandmother; Lung cancer in his father and mother; Prostate cancer in his father. There is no history of Colon polyps, Esophageal cancer, Stomach cancer, or Rectal cancer.  ROS:   Please see the history of present illness.     All other systems reviewed and are negative.  EKGs/Labs/Other Studies Reviewed:    The following studies were reviewed today:   EKG:   05/05/21: Normal sinus rhythm,  PVCs, Q waves in V1/2 04/14/20: normal sinus rhythm, PVCs, no ST abnormalities 05/18/20: EKG was not ordered. 07/14/20: EKG was not ordered. 09/17/20: Sinus rhythm. Rate 96 bpm PVCs, No ST abnormalities. 03/25/21: Normal sinus rhythm, frequent PVCs (trigeminy)  Recent Labs: 03/25/2021: ALT 17 04/14/2021: BNP 43.7; BUN 15; Creatinine, Ser 1.26; Magnesium 2.0; Potassium 3.8; Sodium 140  Recent Lipid Panel    Component Value Date/Time   CHOL 184 03/25/2021 0926   TRIG 472 (H) 03/25/2021 0926   HDL 61 03/25/2021 0926   CHOLHDL 3.0 03/25/2021 0926   LDLCALC 53 03/25/2021 0926    Physical Exam:    VS:  BP 122/72 (BP Location: Left Arm, Patient Position: Sitting, Cuff Size: Large)    Pulse 91    Ht _0  (1.753 m)    Wt 243 lb 6.4 oz (110.4 kg)    SpO2 97%    BMI 35.94 kg/m     Wt Readings from Last 3 Encounters:  05/05/21 243 lb 6.4 oz (110.4 kg)  04/15/21 244 lb 6.4 oz (110.9 kg)  04/14/21 242 lb 6 oz (109.9 kg)     GEN:  Well nourished, well developed in no acute distress HEENT: Normal NECK: No JVD; No carotid bruits LYMPHATICS: No lymphadenopathy CARDIAC: Irregular, no murmurs, rubs, gallops RESPIRATORY: Expiratory wheezing ABDOMEN: Soft, non-tender, non-distended MUSCULOSKELETAL:  1+ bilateral edema; No deformity  SKIN: Warm and dry NEUROLOGIC:  Alert and oriented x 3 PSYCHIATRIC:  Normal affect   ASSESSMENT:    1. Hypertrophic cardiomyopathy ([place])   2. Primary hypertension   3. PVC's (premature ventricular contractions)   4. Tobacco use      PLAN:    HCM: Cardiac MRI 06/23/2020 showed asymmetric hypertrophy measuring up to 20 mm and basal anterior septum (8 mm and posterior wall), consistent with hypertrophic cardiomyopathy.  Also with patchy LGE in basal septum and RV insertion sites consistent with HCM; LGE accounts for 11% of total myocardial mass.  Normal LV/RV size and systolic function.  Echocardiogram on 03/18/2020  showed mild LVOT obstruction, peak gradient 13  mmHg. -Recommended first-degree relatives be screened for HCM: He reports he has talked to his daughters and brother/sisters.  Discussed genetic testing but he requests to hold off at this time -Ideally try to avoid diuretics in HCM but he is volume overloaded on exam.  Started on HCTZ 25 mg daily, repeat echo on 04/14/2021 showed no LVOT obstruction -Continue nebivolol 20 mg BID -NSVT, frequent PVCs on Zio patch.  Refer to EP for evaluation  Chest pain/DOE: Atypical in description but does have CAD risk factors (hypertension, hyperlipidemia, T2DM).   Lexiscan Myoview on 04/29/2020 showed normal perfusion, EF 57%.    PVCs: [employer] monitor x3 days on 05/06/2020 showed 7 episodes of NSVT, longest lasting 3 beats, occasional PVCs (3.9% of beats).  Zio patch x3 days on 04/06/2021 showed frequent PVCs (14.3% of beats), 1 episode of NSVT x4 beats -Refer to EP for evaluation  Leg pain: Normal ABIs on 04/23/2019  Hypertension: On amlodipine 10 mg daily, HCTZ 25 mg daily, valsartan 259 mg, Bystolic 20 mg daily twice daily.  Appears controlled  Tobacco use: Patient counseled on the risk of tobacco use and cessation strongly encouraged.  Have referred to our care guide to work with patient to assist in smoking cessation  Hyperlipidemia: On rosuvastatin 40 mg daily and Zetia 10 mg daily, LDL 53 on 03/25/2021.  Calcium score 117 on 04/29/2020 (81st percentile).   T2DM: On Metformin.  A1c 6.7% on 09/18/2018  OSA: Sleep study 10/2020 showed severe OSA, he is now on CPAP and reports compliance  RTC in 3 months    Medication Adjustments/Labs and Tests Ordered: Current medicines are reviewed at length with the patient today.  Concerns regarding medicines are outlined above.  No orders of the defined types were placed in this encounter.   No orders of the defined types were placed in this encounter.    Patient Instructions  Medication Instructions:  No Changes In Medications at this time.  *If you  need a refill on your cardiac medications before your next appointment, please call your pharmacy*  Follow-Up: At [facility], you and your health needs are our priority.  As part of our continuing mission to provide you with exceptional heart care, we have created designated Provider Care Teams.  These Care Teams include your primary Cardiologist (physician) and Advanced Practice Providers (APPs -  Physician Assistants and Nurse Practitioners) who all work together to provide you with the care you need, when you need it.  Your next appointment:   3 month(s)  The format for your next appointment:   In Person  Provider:   ANY APP   And THEN Dr. Gardiner Rhyme will see you in 6 Months    Signed, Donato Heinz, MD  05/06/2021 9:15 AM    [place]

## 2021-05-03 ENCOUNTER — Other Ambulatory Visit: Payer: Self-pay

## 2021-05-04 ENCOUNTER — Other Ambulatory Visit: Payer: Self-pay

## 2021-05-05 ENCOUNTER — Encounter: Payer: Self-pay | Admitting: Cardiology

## 2021-05-05 ENCOUNTER — Ambulatory Visit (INDEPENDENT_AMBULATORY_CARE_PROVIDER_SITE_OTHER): Payer: 59 | Admitting: Cardiology

## 2021-05-05 ENCOUNTER — Other Ambulatory Visit: Payer: Self-pay

## 2021-05-05 VITALS — BP 122/72 | HR 91 | Ht 69.0 in | Wt 243.4 lb

## 2021-05-05 DIAGNOSIS — Z720 Tobacco use: Secondary | ICD-10-CM

## 2021-05-05 DIAGNOSIS — I493 Ventricular premature depolarization: Secondary | ICD-10-CM

## 2021-05-05 DIAGNOSIS — I422 Other hypertrophic cardiomyopathy: Secondary | ICD-10-CM

## 2021-05-05 DIAGNOSIS — I10 Essential (primary) hypertension: Secondary | ICD-10-CM

## 2021-05-05 NOTE — Patient Instructions (Signed)
Medication Instructions:  No Changes In Medications at this time.  *If you need a refill on your cardiac medications before your next appointment, please call your pharmacy*  Follow-Up: At [facility], you and your health needs are our priority.  As part of our continuing mission to provide you with exceptional heart care, we have created designated Provider Care Teams.  These Care Teams include your primary Cardiologist (physician) and Advanced Practice Providers (APPs -  Physician Assistants and Nurse Practitioners) who all work together to provide you with the care you need, when you need it.  Your next appointment:   3 month(s)  The format for your next appointment:   In Person  Provider:   ANY APP   And THEN Dr. Gardiner Rhyme will see you in 6 Months

## 2021-05-10 ENCOUNTER — Other Ambulatory Visit: Payer: Self-pay | Admitting: Internal Medicine

## 2021-05-11 ENCOUNTER — Other Ambulatory Visit: Payer: Self-pay

## 2021-05-11 ENCOUNTER — Encounter: Payer: Self-pay | Admitting: Internal Medicine

## 2021-05-11 MED ORDER — ALBUTEROL SULFATE (2.5 MG/3ML) 0.083% IN NEBU
2.5000 mg | INHALATION_SOLUTION | RESPIRATORY_TRACT | 0 refills | Status: DC | PRN
  Filled 2021-05-11: qty 150, 9d supply, fill #0

## 2021-05-12 ENCOUNTER — Ambulatory Visit: Payer: 59 | Attending: Physician Assistant | Admitting: Physician Assistant

## 2021-05-12 ENCOUNTER — Other Ambulatory Visit: Payer: Self-pay

## 2021-05-12 DIAGNOSIS — J329 Chronic sinusitis, unspecified: Secondary | ICD-10-CM | POA: Diagnosis not present

## 2021-05-12 DIAGNOSIS — J4550 Severe persistent asthma, uncomplicated: Secondary | ICD-10-CM | POA: Diagnosis not present

## 2021-05-12 MED ORDER — ALBUTEROL SULFATE (2.5 MG/3ML) 0.083% IN NEBU
2.5000 mg | INHALATION_SOLUTION | RESPIRATORY_TRACT | 0 refills | Status: DC | PRN
  Filled 2021-05-12: qty 180, 10d supply, fill #0

## 2021-05-12 MED ORDER — DOXYCYCLINE HYCLATE 100 MG PO TABS: 100.0000 mg | ORAL_TABLET | Freq: Two times a day (BID) | ORAL | 0 refills | Status: DC

## 2021-05-12 MED ORDER — BREZTRI AEROSPHERE 160-9-4.8 MCG/ACT IN AERO
2.0000 | INHALATION_SPRAY | Freq: Two times a day (BID) | RESPIRATORY_TRACT | 11 refills | Status: DC
  Filled 2021-05-12 – 2021-05-14 (×2): qty 10.7, 30d supply, fill #0
  Filled 2021-06-06: qty 10.7, 30d supply, fill #1
  Filled 2021-06-07 – 2021-06-11 (×4): qty 10.7, 30d supply, fill #0

## 2021-05-12 NOTE — Progress Notes (Signed)
Virtual Visit via Telephone Note  I connected with Joseph Harvey on 05/12/21 at  1:30 PM EST by telephone and verified that I am speaking with the correct person using two identifiers.  Location: Patient: home Provider: [facility] office   I discussed the limitations, risks, security and privacy concerns of performing an evaluation and management service by telephone and the availability of in person appointments. I also discussed with the patient that there may be a patient responsible charge related to this service. The patient expressed understanding and agreed to proceed.   History of Present Illness:  patient c/o sinus congestion and drainage and discolored mucus thyat he is blowing out of his nose and coughing up at times.  No fever.  Home Covid test was negative.  This has been going on close to 1 week.  Also needs albuterol nebules and Breztri RF.  No respiratory distress.      Observations/Objective:  NAD.  A&Ox3   Assessment and Plan:  1. Sinusitis, unspecified chronicity, unspecified location - doxycycline (VIBRA-TABS) 100 MG tablet; Take 1 tablet (100 mg total) by mouth 2 (two) times daily.  Dispense: 20 tablet; Refill: 0  2. Severe persistent asthma without complication - Budeson-Glycopyrrol-Formoterol (BREZTRI AEROSPHERE) 160-9-4.8 MCG/ACT AERO; Inhale 2 puffs into the lungs in the morning and at bedtime.  Dispense: 10.7 g; Refill: 11 - albuterol (PROVENTIL) (2.5 MG/3ML) 0.083% nebulizer solution; Take 3 mLs (2.5 mg total) by nebulization every 4 (four) hours as needed for wheezing or shortness of breath (((PLAN B))).  Dispense: 150 mL; Refill: 0    Follow Up Instructions: As next scheduled with PCP/soon for A1C(has appt 05/2021)   I discussed the assessment and treatment plan with the patient. The patient was provided an opportunity to ask questions and all were answered. The patient agreed with the plan and demonstrated an understanding of the instructions.   The patient was  advised to call back or seek an in-person evaluation if the symptoms worsen or if the condition fails to improve as anticipated.  I provided 14 minutes of non-face-to-face time during this encounter.   Joseph Caldron, PA-C  Patient ID: Joseph Harvey, male   DOB: June 28, 1955, 65 y.o.   MRN: [id number]

## 2021-05-13 ENCOUNTER — Other Ambulatory Visit: Payer: Self-pay

## 2021-05-14 ENCOUNTER — Other Ambulatory Visit: Payer: Self-pay

## 2021-05-21 ENCOUNTER — Ambulatory Visit (INDEPENDENT_AMBULATORY_CARE_PROVIDER_SITE_OTHER): Payer: Managed Care, Other (non HMO) | Admitting: Cardiology

## 2021-05-21 ENCOUNTER — Encounter: Payer: Self-pay | Admitting: Cardiology

## 2021-05-21 ENCOUNTER — Ambulatory Visit: Payer: Managed Care, Other (non HMO) | Admitting: Pulmonary Disease

## 2021-05-21 ENCOUNTER — Other Ambulatory Visit: Payer: Self-pay

## 2021-05-21 VITALS — BP 118/82 | HR 91 | Ht 69.0 in | Wt 244.6 lb

## 2021-05-21 DIAGNOSIS — Z01812 Encounter for preprocedural laboratory examination: Secondary | ICD-10-CM | POA: Diagnosis not present

## 2021-05-21 DIAGNOSIS — I493 Ventricular premature depolarization: Secondary | ICD-10-CM | POA: Diagnosis not present

## 2021-05-21 NOTE — Progress Notes (Signed)
Electrophysiology Office Note   Date:  05/21/2021   ID:  Joseph Harvey, DOB 1955-11-15, MRN [id number]  PCP:  Ladell Pier, MD  Cardiologist:  Gardiner Rhyme Primary Electrophysiologist:  Jordanna Hendrie Meredith Leeds, MD    Chief Complaint: PVC   History of Present Illness: Joseph Harvey is a 66 y.o. male who is being seen today for the evaluation of PVC at the request of Donato Heinz*. Presenting today for electrophysiology evaluation.  He has a history significant prostate cancer in remission, type 2 diabetes, hypertension, hyperlipidemia, tobacco abuse.  He was initially seen 04/14/2020 in the hospital with chest pain on minimal exertion.  Echo at that time showed severe septal hypertrophy with otherwise mild concentric LVH and a mild intracavitary gradient.  He had a Myoview with an ejection fraction of 57%.  Cardiac MRI showed symmetric hypertrophy measuring 20 mm in the basal anterior septum consistent with hypertrophic cardiomyopathy.  He had 11% LGE.  Today, he denies symptoms of palpitations, chest pain,  orthopnea, PND, lower extremity edema, claudication, dizziness, presyncope, syncope, bleeding, or neurologic sequela. The patient is tolerating medications without difficulties.  His main complaint today is shortness of breath.  He gets short of breath when he does much activity at all.  He has having trouble exercising due to his shortness of breath.  He gets short of breath when lifting heavy things.  He otherwise has no major complaints.  He does not get palpitations.   Past Medical History:  Diagnosis Date   Allergy    Dust, mold, dust mites   Anemia    Asthma    Cancer ([place])    prostate   Cataract    bilateral repair.   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 180 mL 0   albuterol (VENTOLIN HFA) 108 (90 Base) MCG/ACT inhaler INHALE 2 PUFFS INTO THE LUNGS EVERY 4 (FOUR) HOURS AS NEEDED FOR WHEEZING OR SHORTNESS OF BREATH. 18 g 0   amLODipine (NORVASC) 5 MG tablet TAKE 1 TABLET (5 MG TOTAL) BY MOUTH 2 (TWO) TIMES DAILY. 180 tablet 3   ASPIRIN 81 PO Take 81 mg by mouth daily.     baclofen (LIORESAL) 10 MG tablet take 1 tablet by mouth 3 times a day 270 tablet 3   benzonatate (TESSALON) 100 MG capsule Take 1 capsule (100 mg total) by mouth every 8 (eight) hours. 21 capsule 0   Blood Glucose Monitoring Suppl (TRUE METRIX METER) w/Device KIT Use as directed 1 kit 0   Budeson-Glycopyrrol-Formoterol (BREZTRI AEROSPHERE) 160-9-4.8 MCG/ACT AERO Inhale 2 puffs into the lungs in the morning and at bedtime. 10.7 g 11   buPROPion (WELLBUTRIN XL) 150 MG 24 hr tablet TAKE 1 TABLET (150 MG  TOTAL) BY MOUTH DAILY. 90 tablet 0   busPIRone (BUSPAR) 10 MG tablet Take 1 tablet (10 mg total) by mouth 3 (three) times daily. 90 tablet 3   Cholecalciferol (VITAMIN D PO) Take 1 tablet by mouth daily.     doxycycline (VIBRA-TABS) 100 MG tablet Take 1 tablet (100 mg total) by mouth 2 (two) times daily. 20 tablet 0   ezetimibe (ZETIA) 10 MG tablet Take 1 tablet (10 mg total) by mouth daily. 90 tablet 3   fluticasone (FLONASE) 50 MCG/ACT nasal spray PLACE 2 SPRAYS INTO BOTH NOSTRILS DAILY. 16 g 11   gabapentin (NEURONTIN) 300 MG capsule take 1 capsule by mouth in the morning, then take 1 at lunch and 2 at bedtime 360  capsule 3   glucose blood (TRUE METRIX BLOOD GLUCOSE TEST) test strip Use as instructed 100 each 12   hydrochlorothiazide (HYDRODIURIL) 25 MG tablet Take 1 tablet (25 mg total) by mouth daily. 90 tablet 3   HYDROcodone bit-homatropine (HYCODAN) 5-1.5 MG/5ML syrup Take 5 mLs by mouth every 6 (six) hours as needed for cough. 75 mL 0   ipratropium-albuterol (DUONEB) 0.5-2.5 (3) MG/3ML SOLN USE 1 VIAL VIA NEBULIZER TWICE A DAY 180 mL 2   linaclotide (LINZESS) 145 MCG CAPS capsule Take 1 capsule (145 mcg total) by mouth daily before breakfast. 90 capsule 1   LORazepam (ATIVAN) 1 MG tablet Take 1 tablet (75m) before cardiac MRI 1 tablet 0   metFORMIN (GLUCOPHAGE-XR) 500 MG 24 hr tablet TAKE 1 TABLET BY MOUTH DAILY. 90 tablet 1   montelukast (SINGULAIR) 10 MG tablet Take 1 tablet (10 mg total) by mouth at bedtime. 30 tablet 2   Nebivolol HCl 20 MG TABS Take 1 tablet (20 mg total) by mouth 2 (two) times daily. 90 tablet 5   pantoprazole (PROTONIX) 40 MG tablet Take 1 tablet (40 mg total) by mouth daily as needed. 90 tablet 1   polyethylene glycol powder (GLYCOLAX/MIRALAX) 17 GM/SCOOP powder Take 17 g by mouth daily as needed. 3350 g 1   potassium chloride SA (KLOR-CON) 20 MEQ tablet Take 20 mEq by mouth as needed. When hands cramp up     predniSONE (DELTASONE) 10 MG tablet      predniSONE (DELTASONE) 20 MG tablet prednisone 20 mg tablet  TAKE 3 TABS BY MOUTH ONCE DAILY FOR 3 DAYS, 2 TABS DAILY FOR 3 DAYS, THEN 1 TAB DAILY FOR 3 DAYS     Respiratory Therapy Supplies (FLUTTER) DEVI Use three times a day after inhaler or nebulizer use 1 each 0   rosuvastatin (CRESTOR) 40 MG tablet Take 1 tablet (40 mg total) by mouth daily. 90 tablet 3   TRUEplus Lancets 28G MISC Use as directed 100 each 4   valsartan (DIOVAN) 320 MG tablet Take 1 tablet (320 mg total) by mouth daily. 90 tablet 3   zolpidem (AMBIEN) 10 MG tablet Take upon arrival at sleep lab 1 tablet 0   No current facility-administered medications for  this visit.    Allergies:   Amoxicillin, Lisinopril, Molds & smuts, Robitussin [guaifenesin], and Azithromycin   Social History:  The patient  reports that he has been smoking cigarettes. He started smoking about 47 years ago. He has a 23.00 pack-year smoking history. He has never used smokeless tobacco. He reports current alcohol use of about 3.0 standard drinks per week. He reports that he does not use drugs.   Family History:  The patient's family history includes Allergies in his brother; Asthma in  his mother; Colon cancer in his father; Diabetes in his maternal grandmother; Lung cancer in his father and mother; Prostate cancer in his father.    ROS:  Please see the history of present illness.   Otherwise, review of systems is positive for none.   All other systems are reviewed and negative.    PHYSICAL EXAM: VS:  BP 118/82    Pulse 91    Ht '5\' 9"'  (1.753 m)    Wt 244 lb 9.6 oz (110.9 kg)    SpO2 95%    BMI 36.12 kg/m  , BMI Body mass index is 36.12 kg/m. GEN: Well nourished, well developed, in no acute distress  HEENT: normal  Neck: no JVD, carotid bruits, or masses Cardiac: RRR; no murmurs, rubs, or gallops,no edema  Respiratory:  clear to auscultation bilaterally, normal work of breathing GI: soft, nontender, nondistended, + BS MS: no deformity or atrophy  Skin: warm and dry Neuro:  Strength and sensation are intact Psych: euthymic mood, full affect  EKG:  EKG is ordered today. Personal review of the ekg ordered shows sinus rhythm, PVCs  Recent Labs: 03/25/2021: ALT 17 04/14/2021: BNP 43.7; BUN 15; Creatinine, Ser 1.26; Magnesium 2.0; Potassium 3.8; Sodium 140    Lipid Panel     Component Value Date/Time   CHOL 184 03/25/2021 0926   TRIG 472 (H) 03/25/2021 0926   HDL 61 03/25/2021 0926   CHOLHDL 3.0 03/25/2021 0926   LDLCALC 53 03/25/2021 0926     Wt Readings from Last 3 Encounters:  05/21/21 244 lb 9.6 oz (110.9 kg)  05/05/21 243 lb 6.4 oz (110.4 kg)  04/15/21  244 lb 6.4 oz (110.9 kg)      Other studies Reviewed: Additional studies/ records that were reviewed today include: TTE 04/14/21  Review of the above records today demonstrates:   1. Left ventricular ejection fraction, by estimation, is 60 to 65%. The  left ventricle has normal function. The left ventricle has no regional  wall motion abnormalities. There is severe asymmetric left ventricular  hypertrophy of the basal-septal  segment. No LVOT obstruction. Left ventricular diastolic parameters are  consistent with Grade I diastolic dysfunction (impaired relaxation).   2. Right ventricular systolic function is normal. The right ventricular  size is normal. Tricuspid regurgitation signal is inadequate for assessing  PA pressure.   3. The mitral valve is normal in structure. Trivial mitral valve  regurgitation.   4. The aortic valve is tricuspid. Aortic valve regurgitation is not  visualized. No aortic stenosis is present.   5. The inferior vena cava is normal in size with greater than 50%  respiratory variability, suggesting right atrial pressure of 3 mmHg.   Monitor 04/25/21 - personally reviewed Patient had a min HR of 66 bpm, max HR of 158 bpm, and avg HR of 91 bpm. Predominant underlying rhythm was Sinus Rhythm.  Isolated SVEs were rare (<1.0%) Isolated VEs were frequent (14.3%, 54198), VE Couplets were rare (<1.0%,  286), and no VE Triplets were present. Ventricular Bigeminy and Trigeminy were present.  No patient triggered events  CMRI 06/23/20 1. Asymmetric hypertrophy measuring up to 55m in basal anteroseptum (843min posterior wall), consistent with hypertrophic cardiomyopathy   2. Patchy LGE in basal septum and RV insertion sites, consistent with HCM. LGE accounts for 11% of total myocardial mass   3. Normal LV size and systolic function (EF 6385%  4. Normal RV size and systolic function (EF 6302%  ASSESSMENT AND PLAN:  1.  Hypertrophic cardiomyopathy: Noted on cardiac  MRI 06/23/2020.  LGE of 11% of his myocardial mass with a 20 mm septum.  Currently on nebivolol 20 mg twice daily.  He Shawnya Mayor potentially need an ICD.  Prior to that, we Akaisha Truman plan to treat his PVCs.  We James Lafalce discuss ICD therapy at the next visit.  2.  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  His ejection fraction is fortunately remained normal over the last few years.  He is quite concerned about his elevated PVC burden.  This could be contributing to his shortness of breath.  He would like to avoid medications.  Due to that, we Jennelle Pinkstaff plan for ablation.  Risk and benefits were discussed include bleeding, tamponade, heart block, stroke, among others.  He understands these risks and has agreed to the procedure.  3.  Hypertension: Currently well controlled  4.  Hyperlipidemia: Continue Crestor 40 mg, Zetia 10 mg per primary cardiology  5.  Obstructive sleep apnea: CPAP compliance encouraged  Case discussed with primary cardiology  Current medicines are reviewed at length with the patient today.   The patient does not have concerns regarding his medicines.  The following changes were made today:  none  Labs/ tests ordered today include:  Orders Placed This Encounter  Procedures   Basic metabolic panel   CBC   EKG 12-Lead     Disposition:   FU with Marquavious Nazar 3 months  Signed, Abdikadir Fohl Meredith Leeds, MD  05/21/2021 9:15 AM     [facility] [address] [place]  [place] [address] [phone] (office) [phone] (fax)

## 2021-05-21 NOTE — Patient Instructions (Signed)
Medication Instructions:  Your physician recommends that you continue on your current medications as directed. Please refer to the Current Medication list given to you today.  *If you need a refill on your cardiac medications before your next appointment, please call your pharmacy*   Lab Work: See instruction letter below  If you have labs (blood work) drawn today and your tests are completely normal, you will receive your results only by: [place] (if you have MyChart) OR A paper copy in the mail If you have any lab test that is abnormal or we need to change your treatment, we will call you to review the results.   Testing/Procedures: Your physician has recommended that you have an ablation. Catheter ablation is a medical procedure used to treat some cardiac arrhythmias (irregular heartbeats). During catheter ablation, a long, thin, flexible tube is put into a blood vessel in your groin (upper thigh), or neck. This tube is called an ablation catheter. It is then guided to your heart through the blood vessel. Radio frequency waves destroy small areas of heart tissue where abnormal heartbeats may cause an arrhythmia to start. Please see the instruction letter below   Follow-Up: At [facility], you and your health needs are our priority.  As part of our continuing mission to provide you with exceptional heart care, we have created designated Provider Care Teams.  These Care Teams include your primary Cardiologist (physician) and Advanced Practice Providers (APPs -  Physician Assistants and Nurse Practitioners) who all work together to provide you with the care you need, when you need it.  Your next appointment:   4 week(s) after your ablation  The format for your next appointment:   In Person  Provider:   Allegra Lai, MD    Thank you for choosing [facility]!!   Trinidad Curet, RN [phone]   Other Instructions    Electrophysiology/Ablation Procedure  Instructions   You are scheduled for a(n)  ablation on 08/10/2021 with Dr. Allegra Lai.   1.   Pre procedure testing-             A.  LAB WORK --- On 08/03/2021  for your pre procedure blood work.  You do NOT need to be fasting. You can stop by the [employer] office anytime between 7:30 am - 4:30 pm    PROCEDURE DAY: On the day of your procedure 08/10/2021 you will go to [facility] hospital ([address]) at 8:30 am.  Dennis Bast will go to the main entrance A [employer]) and enter where the [employer] are.  Your driver will drop you off and you will head down the hallway to ADMITTING.  You may have one support person come in to the hospital with you.  They will be asked to wait in the waiting room.  It is OK to have someone drop you off and come back when you are ready to be discharged.   3.   Do not eat or drink after midnight prior to your procedure.   4.   Do NOT take any medications the morning of your procedure.   5.  Plan for an overnight stay, but you may be discharged home after your procedure. If you use your phone frequently bring your phone charger, in case you have to stay.  If you are discharged after your procedure you will need someone to drive you home and be with your for 24 hours after your procedure.   6.  You will follow up with Dr. Curt Bears  1 month after your procedure.  This appointment will be made for you.    * If you have ANY questions please call the office [phone] and ask for Elvie Palomo RN or send me a MyChart message   * Occasionally, EP Studies and ablations can become lengthy.  Please make your family aware of this before your procedure starts.  Average time ranges from 2-8 hours for EP studies/ablations.  Your physician will call your family after the procedure with the results.

## 2021-05-24 ENCOUNTER — Other Ambulatory Visit: Payer: Managed Care, Other (non HMO)

## 2021-05-24 ENCOUNTER — Ambulatory Visit: Payer: Managed Care, Other (non HMO) | Admitting: Physician Assistant

## 2021-05-24 ENCOUNTER — Ambulatory Visit: Payer: 59 | Admitting: Podiatry

## 2021-05-24 ENCOUNTER — Encounter: Payer: Self-pay | Admitting: Physician Assistant

## 2021-05-24 VITALS — BP 128/80 | HR 89 | Ht 69.0 in | Wt 242.0 lb

## 2021-05-24 DIAGNOSIS — R1011 Right upper quadrant pain: Secondary | ICD-10-CM | POA: Diagnosis not present

## 2021-05-24 DIAGNOSIS — Z8619 Personal history of other infectious and parasitic diseases: Secondary | ICD-10-CM

## 2021-05-24 DIAGNOSIS — Z86010 Personal history of colonic polyps: Secondary | ICD-10-CM | POA: Diagnosis not present

## 2021-05-24 DIAGNOSIS — Z860101 Personal history of adenomatous and serrated colon polyps: Secondary | ICD-10-CM

## 2021-05-24 DIAGNOSIS — R208 Other disturbances of skin sensation: Secondary | ICD-10-CM

## 2021-05-24 DIAGNOSIS — K5909 Other constipation: Secondary | ICD-10-CM

## 2021-05-24 MED ORDER — HYDROCORTISONE ACETATE 25 MG RE SUPP
25.0000 mg | Freq: Every day | RECTAL | 1 refills | Status: DC
Start: 2021-05-24 — End: 2021-08-16

## 2021-05-24 NOTE — Patient Instructions (Signed)
If you are age 65 or younger, your body mass index should be between 19-25. Your Body mass index is 35.74 kg/m. If this is out of the aformentioned range listed, please consider follow up with your Primary Care Provider.   You have been scheduled for an abdominal ultrasound at [facility] (1st floor of hospital) on 05/28/2021 at 10:30 am. Please arrive 15 minutes prior to your appointment for registration. Make certain not to have anything to eat or drink 6 hours prior to your appointment. Should you need to reschedule your appointment, please contact radiology at [phone]. This test typically takes about 30 minutes to perform.  Start Linzess 72 mcg 1 capsule in the morning. Call the office to let us know how they are working for you so that we can get a prescription sent in for you.  Start hydrocortisone suppositories 1 suppository rectally at bedtime for 5-7 days.   Get Recticare Advance, apply to the rectum 3-4 times daily as needed.  Follow up pending the results of your Ultrasound.  Thank you for entrusting me with your care and choosing [facility].  Amy Esterwood, PA-C

## 2021-05-25 ENCOUNTER — Encounter: Payer: Self-pay | Admitting: Physician Assistant

## 2021-05-25 NOTE — Progress Notes (Signed)
Subjective:    Patient ID: Joseph Harvey, male    DOB: Jul 13, 1955, 66 y.o.   MRN: [id number]  HPI Joseph Harvey is a 66 year old African-American male, established with Dr. Silverio Decamp, and previously seen for colonoscopy.  He comes back in today with complaints of chronic constipation, abdominal bloating and periodic right-sided abdominal pain. Patient had colonoscopy in May 2021 with history of adenomatous colon polyps.  He was noted to have nonbleeding small internal hemorrhoids and had 2 polyps removed from the sigmoid colon which were 8 to 12 mm in size.  Path showed these to be tubular adenomas and he is indicated for 3-year interval follow-up. Patient has history of asthma, hypertension, obesity, GERD, adult onset diabetes mellitus, and history of prostate cancer for which he underwent prostatectomy. He relates that his PCP started him on Linzess 145 mcg daily for constipation.  He says this works too well and about half an hour after taking it he will have a good bowel movement however this will be followed by episodes of diarrhea.  He says if he does not take something then he will become bloated and uncomfortable in his upper abdomen and constipated and will go 2 to 3 days between bowel movements.  He has used Dulcolax which usually causes diarrhea as well.  He has tried MiraLAX in the past but does not feel that that works well. He also relates that he has had some mild right upper quadrant pain today and had a similar episode about 3 months ago this has not been associated with nausea or vomiting.  He says he had previously been diagnosed with H. pylori and treated and that he did have some right-sided abdominal pain with that diagnosis.  He would like to be retested for H. pylori.  Review of Systems. Pertinent positive and negative review of systems were noted in the above HPI section.  All other review of systems was otherwise negative.   Outpatient Encounter Medications as of 05/24/2021  Medication  Sig   albuterol (PROVENTIL) (2.5 MG/3ML) 0.083% nebulizer solution Take 3 mLs (2.5 mg total) by nebulization every 4 (four) hours as needed for wheezing or shortness of breath (((PLAN B))).   albuterol (VENTOLIN HFA) 108 (90 Base) MCG/ACT inhaler INHALE 2 PUFFS INTO THE LUNGS EVERY 4 (FOUR) HOURS AS NEEDED FOR WHEEZING OR SHORTNESS OF BREATH.   amLODipine (NORVASC) 5 MG tablet TAKE 1 TABLET (5 MG TOTAL) BY MOUTH 2 (TWO) TIMES DAILY.   ASPIRIN 81 PO Take 81 mg by mouth daily.   baclofen (LIORESAL) 10 MG tablet take 1 tablet by mouth 3 times a day   benzonatate (TESSALON) 100 MG capsule Take 1 capsule (100 mg total) by mouth every 8 (eight) hours.   Blood Glucose Monitoring Suppl (TRUE METRIX METER) w/Device KIT Use as directed   Budeson-Glycopyrrol-Formoterol (BREZTRI AEROSPHERE) 160-9-4.8 MCG/ACT AERO Inhale 2 puffs into the lungs in the morning and at bedtime.   buPROPion (WELLBUTRIN XL) 150 MG 24 hr tablet TAKE 1 TABLET (150 MG TOTAL) BY MOUTH DAILY.   busPIRone (BUSPAR) 10 MG tablet Take 1 tablet (10 mg total) by mouth 3 (three) times daily.   Cholecalciferol (VITAMIN D PO) Take 1 tablet by mouth daily.   doxycycline (VIBRA-TABS) 100 MG tablet Take 1 tablet (100 mg total) by mouth 2 (two) times daily.   ezetimibe (ZETIA) 10 MG tablet Take 1 tablet (10 mg total) by mouth daily.   fluticasone (FLONASE) 50 MCG/ACT nasal spray PLACE 2 SPRAYS INTO BOTH NOSTRILS  DAILY.   gabapentin (NEURONTIN) 300 MG capsule take 1 capsule by mouth in the morning, then take 1 at lunch and 2 at bedtime   glucose blood (TRUE METRIX BLOOD GLUCOSE TEST) test strip Use as instructed   hydrochlorothiazide (HYDRODIURIL) 25 MG tablet Take 1 tablet (25 mg total) by mouth daily.   HYDROcodone bit-homatropine (HYCODAN) 5-1.5 MG/5ML syrup Take 5 mLs by mouth every 6 (six) hours as needed for cough.   hydrocortisone (ANUSOL-HC) 25 MG suppository Place 1 suppository (25 mg total) rectally at bedtime for 7 days.    ipratropium-albuterol (DUONEB) 0.5-2.5 (3) MG/3ML SOLN USE 1 VIAL VIA NEBULIZER TWICE A DAY   linaclotide (LINZESS) 145 MCG CAPS capsule Take 1 capsule (145 mcg total) by mouth daily before breakfast.   LORazepam (ATIVAN) 1 MG tablet Take 1 tablet (59m) before cardiac MRI   metFORMIN (GLUCOPHAGE-XR) 500 MG 24 hr tablet TAKE 1 TABLET BY MOUTH DAILY.   montelukast (SINGULAIR) 10 MG tablet Take 1 tablet (10 mg total) by mouth at bedtime.   Nebivolol HCl 20 MG TABS Take 1 tablet (20 mg total) by mouth 2 (two) times daily.   pantoprazole (PROTONIX) 40 MG tablet Take 1 tablet (40 mg total) by mouth daily as needed.   polyethylene glycol powder (GLYCOLAX/MIRALAX) 17 GM/SCOOP powder Take 17 g by mouth daily as needed.   potassium chloride SA (KLOR-CON) 20 MEQ tablet Take 20 mEq by mouth as needed. When hands cramp up   predniSONE (DELTASONE) 10 MG tablet    predniSONE (DELTASONE) 20 MG tablet prednisone 20 mg tablet  TAKE 3 TABS BY MOUTH ONCE DAILY FOR 3 DAYS, 2 TABS DAILY FOR 3 DAYS, THEN 1 TAB DAILY FOR 3 DAYS   Respiratory Therapy Supplies (FLUTTER) DEVI Use three times a day after inhaler or nebulizer use   rosuvastatin (CRESTOR) 40 MG tablet Take 1 tablet (40 mg total) by mouth daily.   TRUEplus Lancets 28G MISC Use as directed   valsartan (DIOVAN) 320 MG tablet Take 1 tablet (320 mg total) by mouth daily.   zolpidem (AMBIEN) 10 MG tablet Take upon arrival at sleep lab   No facility-administered encounter medications on file as of 05/24/2021.   Allergies  Allergen Reactions   Amoxicillin Anaphylaxis    Has patient had a PCN reaction causing immediate rash, facial/tongue/throat swelling, SOB or lightheadedness with hypotension: Yes Has patient had a PCN reaction causing severe rash involving mucus membranes or skin necrosis: No Has patient had a PCN reaction that required hospitalization: Yes Has patient had a PCN reaction occurring within the last 10 years: Yes If all of the  above answers are "NO", then may proceed with Cephalosporin use.    Lisinopril Shortness Of Breath   Molds & Smuts Anaphylaxis   Robitussin [Guaifenesin] Shortness Of Breath    wheezing   Azithromycin Other (See Comments)   Patient Active Problem List   Diagnosis Date Noted   Pain due to onychomycosis of toenails of both feet 08/21/2020   Panic disorder 11/15/2019   Lipomatosis 07/09/2019   Low back pain 09/25/2018   Lumbar radiculopathy 09/25/2018   Constipation 09/17/2018   History of prostate cancer 09/17/2018   Hyperlipidemia associated with type 2 diabetes mellitus ([place] 09/17/2018   History of paranasal sinus congestion 07/20/2018   Tobacco dependence 07/20/2018   Controlled type 2 diabetes mellitus without complication, without long-term current use of insulin [place] 07/20/2018   Vitamin D deficiency 08/18/2016   Hyperlipidemia LDL goal <100 08/10/2016  Renal insufficiency 07/11/2016   Family history of colon cancer 09/30/2015   GERD (gastroesophageal reflux disease) 09/30/2015   History of colon polyps 09/30/2015   Obesity, morbid ([place]) 03/07/2015   Allergic rhinitis 12/15/2014   Malignant neoplasm of prostate ([place]) 06/02/2014   Cough 07/25/2012   HBP (high blood pressure) 06/01/2012   Asthma, severe persistent 05/30/2012   Asthma 05/30/2012   Social History   Socioeconomic History   Marital status: Single    Spouse name: Not on file   Number of children: 2   Years of education: 12 grade   Highest education level: Not on file  Occupational History   Occupation: International aid/development worker: [employer] ([employer]  Tobacco Use   Smoking status: Every Day    Packs/day: 0.50    Years: 46.00    Pack years: 23.00    Types: Cigarettes    Start date: 1976   Smokeless tobacco: Never   Tobacco comments:    still smoking 0.5 ppd  Vaping Use   Vaping Use: Never used  Substance and Sexual Activity   Alcohol use: Not Currently     Alcohol/week: 3.0 standard drinks    Types: 3 Cans of beer per week    Comment: rarely   Drug use: No   Sexual activity: Not Currently  Other Topics Concern   Not on file  Social History Narrative   Not on file   Social Determinants of Health   Financial Resource Strain: Not on file  Food Insecurity: Not on file  Transportation Needs: Not on file  Physical Activity: Not on file  Stress: No Stress Concern Present   Feeling of Stress : Only a little  Social Connections: Not on file  Intimate Partner Violence: Not on file    Mr. Kruser's family history includes Allergies in his brother; Asthma in his mother; Colon cancer in his father; Diabetes in his maternal grandmother; Lung cancer in his father and mother; Prostate cancer in his father.      Objective:    Vitals:   05/24/21 1022  BP: 128/80  Pulse: 89    Physical Exam Well-developed well-nourished older African-American male in no acute distress.   Weight, 242 BMI 35.7  HEENT; nontraumatic normocephalic, EOMI, PE R LA, sclera anicteric. Oropharynx; not examined today Neck; supple, no JVD Cardiovascular; regular rate and rhythm with S1-S2, no murmur rub or gallop Pulmonary; Clear bilaterally Abdomen; soft,  nondistended, no palpable mass or hepatosplenomegaly, bowel sounds are active, umbilical incisional scar Rectal; not done today Skin; benign exam, no jaundice rash or appreciable lesions Extremities; no clubbing cyanosis or edema skin warm and dry Neuro/Psych; alert and oriented x4, grossly nonfocal mood and affect appropriate        Assessment & Plan:   #47 66 year old African-American male with history of adenomatous colon polyps-up-to-date with colonoscopy last done May 2021 and indicated for 3-year interval follow-up #2 chronic constipation in setting of narcotic use.  Constipation is mild and he develops diarrhea with Linzess 145 mcg daily, has also had diarrhea with Dulcolax, does not feel that  MiraLAX helps.  If he does not take something for his bowels then he will become bloated uncomfortable and will not have a bowel movement for 2 to 3 days. Symptoms are consistent with functional constipation and probable component of medication induced  #3 episode of right upper quadrant pain about 3 months ago and mild recurrence today, no significant tenderness on exam Etiology is not clear #4  previously documented internal hemorrhoids 5.  Hypertension 6.  Obesity 7.  Asthma 8.  GERD controlled on Protonix once daily  Plan; patient was given samples of Linzess 72 mcg and asked to take this on a daily or every other day basis.  If he finds this helpful and has less issues with urgency and diarrhea he is asked to call back and we will send a prescription for the 72 mcg dose. If Linzess is not effective or causes trouble with diarrhea we will give him a trial of low-dose Amitiza 8 mcg once or twice daily. Check H. pylori stool antigen Schedule for upper abdominal ultrasound He will use RectiCare advanced as needed for any external hemorrhoidal symptoms and Anusol HC suppositories were sent for as needed use for internal hemorrhoidal symptoms.  He is advised to use this for 5 to 6 days consecutively should he have symptoms and then repeat course as needed. Plan follow-up colonoscopy May 2024   Alfredia Ferguson PA-C 05/25/2021   Cc: Ladell Pier, MD

## 2021-05-26 ENCOUNTER — Other Ambulatory Visit: Payer: Self-pay

## 2021-05-26 ENCOUNTER — Ambulatory Visit: Payer: 59 | Admitting: Pulmonary Disease

## 2021-05-26 ENCOUNTER — Encounter: Payer: Self-pay | Admitting: Pulmonary Disease

## 2021-05-26 ENCOUNTER — Ambulatory Visit: Payer: Managed Care, Other (non HMO) | Admitting: Pulmonary Disease

## 2021-05-26 ENCOUNTER — Ambulatory Visit (INDEPENDENT_AMBULATORY_CARE_PROVIDER_SITE_OTHER): Payer: Managed Care, Other (non HMO) | Admitting: Pulmonary Disease

## 2021-05-26 ENCOUNTER — Other Ambulatory Visit: Payer: Self-pay | Admitting: Physician Assistant

## 2021-05-26 VITALS — BP 126/64 | HR 94 | Temp 98.0°F | Ht 69.0 in | Wt 244.6 lb

## 2021-05-26 DIAGNOSIS — F17210 Nicotine dependence, cigarettes, uncomplicated: Secondary | ICD-10-CM

## 2021-05-26 DIAGNOSIS — R0609 Other forms of dyspnea: Secondary | ICD-10-CM | POA: Diagnosis not present

## 2021-05-26 DIAGNOSIS — J329 Chronic sinusitis, unspecified: Secondary | ICD-10-CM

## 2021-05-26 DIAGNOSIS — J4550 Severe persistent asthma, uncomplicated: Secondary | ICD-10-CM

## 2021-05-26 LAB — PULMONARY FUNCTION TEST
DL/VA % pred: 99 %
DL/VA: 4.16 ml/min/mmHg/L
DLCO cor % pred: 71 %
DLCO cor: 19.07 ml/min/mmHg
DLCO unc % pred: 71 %
DLCO unc: 19.07 ml/min/mmHg
FEF 25-75 Post: 1.04 L/sec
FEF 25-75 Pre: 1.17 L/sec
FEF2575-%Change-Post: -10 %
FEF2575-%Pred-Post: 38 %
FEF2575-%Pred-Pre: 43 %
FEV1-%Change-Post: -1 %
FEV1-%Pred-Post: 55 %
FEV1-%Pred-Pre: 56 %
FEV1-Post: 1.65 L
FEV1-Pre: 1.69 L
FEV1FVC-%Change-Post: 0 %
FEV1FVC-%Pred-Pre: 94 %
FEV6-%Change-Post: 0 %
FEV6-%Pred-Post: 61 %
FEV6-%Pred-Pre: 61 %
FEV6-Post: 2.28 L
FEV6-Pre: 2.3 L
FEV6FVC-%Change-Post: 0 %
FEV6FVC-%Pred-Post: 104 %
FEV6FVC-%Pred-Pre: 103 %
FVC-%Change-Post: -1 %
FVC-%Pred-Post: 59 %
FVC-%Pred-Pre: 59 %
FVC-Post: 2.28 L
FVC-Pre: 2.31 L
Post FEV1/FVC ratio: 72 %
Post FEV6/FVC ratio: 100 %
Pre FEV1/FVC ratio: 73 %
Pre FEV6/FVC Ratio: 100 %

## 2021-05-26 NOTE — Progress Notes (Signed)
Patient ID: Joseph Harvey, male    DOB: [birth year], 66 y.o.   MRN: [id number]  Chief Complaint  Patient presents with   Follow-up    Asthma follow up with pft    Referring provider: Ladell Pier, MD  HPI:   Joseph Harvey is a 66 y.o. man whom we are seeing in follow-up for dyspnea exertion, asthma.  Cardiology note x3 reviewed.  Since last visit visit with cardiology multiple times.  He had stopped taking his hydrochlorothiazide this was resumed given swelling and weight gain.  Swelling has improved.  Dyspnea slightly better but persistent.  Noted to have relative significant burden of PVCs per review of notes as well as outpatient monitor.  Recently met with electrophysiology, plan for ablation in the coming weeks.  PFTs performed today, spirometry stable, no longer has bronchodilator response.  Discussed this likely reflects better controlled asthma symptoms.  To look vastly down, likely reflective of obesity given decrease in ERV that was already low.  HPI at initial visit: [name] last in 2016.  At last visit symptoms are well controlled with Symbicort.  Unclear exactly what occurred in the last 5 years.  But over the last several months he endorses worsening dyspnea on exertion.  Just walking around going to the gym he has become short of breath.  Endorses severe shortness of breath.  This is resolved within a minute or 2 of albuterol administration.  In general his cough is much better than prior.  Suspect he is adhered well to Dr. Gustavus Bryant instructions regarding his airway cough syndrome.  He does feel like he has significant nasal congestion and postnasal drip.  This produces mucus in the back of her throat needs to clear her cough up but feels much different than his prior cough.  Scheduled to have sinus surgery but this was discontinued due to wheezing per his report.  His insurance is changed and now needs to find a new ENT doctor.  When he gets bad bouts of  that mucus buildup he will take a dose of prednisone and within a minute, instantly the mucus feels better.  In terms of his dyspnea, rest improves his dyspnea.  There is no other aggravating or alleviating factors.  He has been on Breo for the last several months and says he thinks it helps somewhat with his breathing but is not at the level it was when he was followed by pulmonary prior.  Prior PFTs reviewed and interpreted as suggestive of mild restriction on spirometry, no fixed obstruction, lung volumes revealed TLC of 86% predicted, within normal limits or mildly reduced.  DLCO within normal limits.  Most recent chest x-ray 06/2018 reviewed instructed is clear lungs, current hyperinflation on PA film does not appear hyperinflated on lateral film.  PMH: Anxiety, obesity, diabetes, tobacco abuse, prostate cancer Surgical history: Lumbar back surgery Family History: Father with colon and lung cancer, mother with lung cancer Social history: Grew up in [place], lived there for 14 years, current smoker, 20+ pack year history   Questionaires / Pulmonary Flowsheets:   ACT:  Asthma Control Test ACT Total Score  03/10/2021 13  02/26/2020 9    MMRC: No flowsheet data found.  Epworth:  No flowsheet data found.  Tests:   FENO:  No results found for: NITRICOXIDE  PFT: PFT Results Latest Ref Rng & Units 05/26/2021 02/28/2020  FVC-Pre L 2.31 2.06  FVC-Predicted Pre % 59 52  FVC-Post L 2.28 2.30  FVC-Predicted Post % 59 59  Pre FEV1/FVC % % 73 70  Post FEV1/FCV % % 72 74  FEV1-Pre L 1.69 1.43  FEV1-Predicted Pre % 56 47  FEV1-Post L 1.65 1.70  DLCO uncorrected ml/min/mmHg 19.07 21.37  DLCO UNC% % 71 80  DLCO corrected ml/min/mmHg 19.07 22.15  DLCO COR %Predicted % 71 82  DLVA Predicted % 99 123  Personally reviewed and interpreted as mixed restrictive and obstructive physiology with gas trapping, normal DLCO, repeat in 05/2021 showed no longer bronchodilator response likely consistent  with better controlled asthma, spirometry overall stable  WALK:  No flowsheet data found.  Imaging: Reviewed as per EMR  Lab Results: Personally reviewed, no significant elevation of eosinophils, IgE and RAST panel negative in past CBC    Component Value Date/Time   WBC 8.7 02/26/2020 1007   RBC 4.99 02/26/2020 1007   HGB 16.4 02/26/2020 1007   HGB 16.0 11/15/2019 0935   HCT 46.8 02/26/2020 1007   HCT 47.8 11/15/2019 0935   PLT 387.0 02/26/2020 1007   PLT 361 11/15/2019 0935   MCV 93.8 02/26/2020 1007   MCV 93 11/15/2019 0935   MCH 31.1 11/15/2019 0935   MCH 31.3 06/20/2018 0309   MCHC 35.0 02/26/2020 1007   RDW 12.6 02/26/2020 1007   RDW 12.4 11/15/2019 0935   LYMPHSABS 3.2 02/26/2020 1007   MONOABS 0.7 02/26/2020 1007   EOSABS 0.1 02/26/2020 1007   BASOSABS 0.2 (H) 02/26/2020 1007    BMET    Component Value Date/Time   NA 140 04/14/2021 0000   K 3.8 04/14/2021 0000   CL 97 04/14/2021 0000   CO2 29 04/14/2021 0000   GLUCOSE 113 (H) 04/14/2021 0000   GLUCOSE 109 (H) 06/20/2018 0309   BUN 15 04/14/2021 0000   CREATININE 1.26 04/14/2021 0000   CALCIUM 10.7 (H) 04/14/2021 0000   GFRNONAA 59 (L) 04/14/2020 1036   GFRAA 68 04/14/2020 1036    BNP    Component Value Date/Time   BNP 43.7 04/14/2021 0855   BNP 25.1 07/24/2014 1256    ProBNP No results found for: PROBNP     Allergies  Allergen Reactions   Amoxicillin Anaphylaxis    Has patient had a PCN reaction causing immediate rash, facial/tongue/throat swelling, SOB or lightheadedness with hypotension: Yes Has patient had a PCN reaction causing severe rash involving mucus membranes or skin necrosis: No Has patient had a PCN reaction that required hospitalization: Yes Has patient had a PCN reaction occurring within the last 10 years: Yes If all of the above answers are "NO", then may proceed with Cephalosporin use.    Lisinopril Shortness Of Breath   Molds & Smuts Anaphylaxis   Robitussin  [Guaifenesin] Shortness Of Breath    wheezing   Azithromycin Other (See Comments)    Immunization History  Administered Date(s) Administered   DTaP 03/16/2012   Influenza Split 02/07/2013, 02/14/2015, 01/21/2016   Influenza Whole 04/15/2012, 02/10/2020   Influenza, Seasonal, Injecte, Preservative Fre 02/07/2013, 02/14/2015, 01/21/2016, 01/27/2017   Influenza,inj,Quad PF,6+ Mos 01/28/2019   Influenza,inj,Quad PF,6-35 Mos 02/08/2021   Influenza-Unspecified 02/07/2013, 02/07/2013, 02/14/2015, 02/14/2015, 01/21/2016, 01/21/2016, 12/22/2016, 12/22/2016, 01/27/2017, 01/27/2017   PFIZER(Purple Top)SARS-COV-2 Vaccination 07/29/2019, 08/19/2019, 01/07/2020, 03/17/2021   Pneumococcal Conjugate-13 03/16/2015   Pneumococcal Polysaccharide-23 03/16/2012, 05/16/2013   Tdap 12/19/2016   Zoster Recombinat (Shingrix) 12/19/2016, 02/27/2017    Past Medical History:  Diagnosis Date   Allergy    Dust, mold, dust mites   Anemia    Asthma  Cancer [facility])    prostate   Cataract    bilateral repair.   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   Pneumonia     Tobacco History: Social History   Tobacco Use  Smoking Status Every Day   Packs/day: 0.50   Years: 46.00   Pack years: 23.00   Types: Cigarettes   Start date: 1976  Smokeless Tobacco Never  Tobacco Comments   still smoking 0.5 ppd   Ready to quit: Not Answered Counseling given: Not Answered Tobacco comments: still smoking 0.5 ppd      Outpatient Encounter Medications as of 05/26/2021  Medication Sig   albuterol (PROVENTIL) (2.5 MG/3ML) 0.083% nebulizer solution Take 3 mLs (2.5 mg total) by nebulization every 4 (four) hours as needed for wheezing or shortness of breath (((PLAN B))).   albuterol (VENTOLIN HFA) 108 (90 Base) MCG/ACT inhaler INHALE 2 PUFFS INTO THE LUNGS EVERY 4 (FOUR) HOURS AS NEEDED  FOR WHEEZING OR SHORTNESS OF BREATH.   amLODipine (NORVASC) 5 MG tablet TAKE 1 TABLET (5 MG TOTAL) BY MOUTH 2 (TWO) TIMES DAILY.   ASPIRIN 81 PO Take 81 mg by mouth daily.   baclofen (LIORESAL) 10 MG tablet take 1 tablet by mouth 3 times a day   benzonatate (TESSALON) 100 MG capsule Take 1 capsule (100 mg total) by mouth every 8 (eight) hours.   Blood Glucose Monitoring Suppl (TRUE METRIX METER) w/Device KIT Use as directed   Budeson-Glycopyrrol-Formoterol (BREZTRI AEROSPHERE) 160-9-4.8 MCG/ACT AERO Inhale 2 puffs into the lungs in the morning and at bedtime.   buPROPion (WELLBUTRIN XL) 150 MG 24 hr tablet TAKE 1 TABLET (150 MG TOTAL) BY MOUTH DAILY.   busPIRone (BUSPAR) 10 MG tablet Take 1 tablet (10 mg total) by mouth 3 (three) times daily.   Cholecalciferol (VITAMIN D PO) Take 1 tablet by mouth daily.   doxycycline (VIBRA-TABS) 100 MG tablet Take 1 tablet (100 mg total) by mouth 2 (two) times daily.   ezetimibe (ZETIA) 10 MG tablet Take 1 tablet (10 mg total) by mouth daily.   fluticasone (FLONASE) 50 MCG/ACT nasal spray PLACE 2 SPRAYS INTO BOTH NOSTRILS DAILY.   gabapentin (NEURONTIN) 300 MG capsule take 1 capsule by mouth in the morning, then take 1 at lunch and 2 at bedtime   glucose blood (TRUE METRIX BLOOD GLUCOSE TEST) test strip Use as instructed   hydrochlorothiazide (HYDRODIURIL) 25 MG tablet Take 1 tablet (25 mg total) by mouth daily.   HYDROcodone bit-homatropine (HYCODAN) 5-1.5 MG/5ML syrup Take 5 mLs by mouth every 6 (six) hours as needed for cough.   hydrocortisone (ANUSOL-HC) 25 MG suppository Place 1 suppository (25 mg total) rectally at bedtime for 7 days.   ipratropium-albuterol (DUONEB) 0.5-2.5 (3) MG/3ML SOLN USE 1 VIAL VIA NEBULIZER TWICE A DAY   linaclotide (LINZESS) 145 MCG CAPS capsule Take 1 capsule (145 mcg total) by mouth daily before breakfast.   metFORMIN (GLUCOPHAGE-XR) 500 MG 24 hr tablet TAKE 1 TABLET BY MOUTH DAILY.   montelukast (SINGULAIR) 10 MG tablet Take  1 tablet (10 mg total) by mouth at bedtime.   Nebivolol HCl 20 MG TABS Take 1 tablet (20 mg total) by mouth 2 (two) times daily.   pantoprazole (PROTONIX) 40 MG tablet Take 1 tablet (40 mg total) by mouth daily as needed.   polyethylene glycol powder (  GLYCOLAX/MIRALAX) 17 GM/SCOOP powder Take 17 g by mouth daily as needed.   potassium chloride SA (KLOR-CON) 20 MEQ tablet Take 20 mEq by mouth as needed. When hands cramp up   predniSONE (DELTASONE) 10 MG tablet    Respiratory Therapy Supplies (FLUTTER) DEVI Use three times a day after inhaler or nebulizer use   rosuvastatin (CRESTOR) 40 MG tablet Take 1 tablet (40 mg total) by mouth daily.   TRUEplus Lancets 28G MISC Use as directed   valsartan (DIOVAN) 320 MG tablet Take 1 tablet (320 mg total) by mouth daily.   zolpidem (AMBIEN) 10 MG tablet Take upon arrival at sleep lab   LORazepam (ATIVAN) 1 MG tablet Take 1 tablet (38m) before cardiac MRI   predniSONE (DELTASONE) 20 MG tablet prednisone 20 mg tablet  TAKE 3 TABS BY MOUTH ONCE DAILY FOR 3 DAYS, 2 TABS DAILY FOR 3 DAYS, THEN 1 TAB DAILY FOR 3 DAYS   No facility-administered encounter medications on file as of 05/26/2021.     Review of Systems  Review of Systems  N/a Physical Exam  BP 126/64 (BP Location: Left Arm, Patient Position: Sitting, Cuff Size: Normal)    Pulse 94    Temp 98 F (36.7 C) (Oral)    Ht _0  (1.753 m)    Wt 244 lb 9.6 oz (110.9 kg)    SpO2 95%    BMI 36.12 kg/m   Wt Readings from Last 5 Encounters:  05/26/21 244 lb 9.6 oz (110.9 kg)  05/24/21 242 lb (109.8 kg)  05/21/21 244 lb 9.6 oz (110.9 kg)  05/05/21 243 lb 6.4 oz (110.4 kg)  04/15/21 244 lb 6.4 oz (110.9 kg)    BMI Readings from Last 5 Encounters:  05/26/21 36.12 kg/m  05/24/21 35.74 kg/m  05/21/21 36.12 kg/m  05/05/21 35.94 kg/m  04/15/21 36.09 kg/m     Physical Exam General: Well-appearing, sitting up in exam chair Eyes: EOMI, icterus Neck: No JVP appreciated, neck  supple, Respiratory: Clear, no wheeze which is improved from prior cardiovascular: Regular rate, regular rhythm, no murmurs Abdomen: Nondistended, bowel sounds present MSK: No joint effusion, no synovitis Neuro: Normal gait, no weakness Psych: Normal mood, full affect    Assessment & Plan:   Dyspnea on exertion: Suspect multifactorial.  Asthma as a contributor.   Further discussion as below regarding asthma.  Other contributors include suspected anxiety/hyperventilation syndrome, obesity, deconditioning.  Suspect weight gain also contributing. Following with cardiology for HCM and now with OSA on CPAP. Ongoing smoking a factor. Repeat PFTs 05/2021 with stable spirometry, no longer bronchodilator response.  ERV and TLC low suggestive of obesity/chest wall interference.  DLCO mildly reduced.  Could consider repeat chest imaging, CT scan in the future.  Notably totally clear in 2016.  Has upcoming PVC ablation, hopeful this will improve his symptoms.  Asthma: He has atopic symptoms. On breztri.  No wheeze today.  No longer has bronchodilator response on PFTs 05/2021, likely reflective of well-controlled asthma.  Smoking assessment and cessation counseling Patient currently smoking: I have advised the patient to quit/stop smoking as soon as possible due to high risk for multiple medical problems.  It will also be very difficult for [place] manage patient's  respiratory symptoms and status if we continue to expose her lungs to a known irritant.  We do not advise e-cigarettes as a form of stopping smoking. Patient is willing to quit smoking.  I have advised the patient that we can assist and have options of  nicotine replacement therapy, provided smoking cessation education today, provided smoking cessation counseling, and provided cessation resources. Has failed Wellbutrin and nicotine replacement. Failed efforts at smoking cessation clinic. Recommend hypnotism.   Return in about 4 months (around  09/23/2021).   Lanier Clam, MD 05/27/2021  I spent 42 minutes in care of patient including face to face visit, review of records, coordination of care.

## 2021-05-26 NOTE — Progress Notes (Signed)
PFT done today. 

## 2021-05-26 NOTE — Patient Instructions (Signed)
Nice to see you again  No changes in medication  I am hopeful that the heart procedure will improve your shortness of breath  I will look into why the ENT referral has not gone through.  I see that the order was placed when we last met.  I will look for the device you are talking about in terms of helping clean the mucus in the lungs.  Return to clinic in 4 months or sooner as needed with Dr. Silas Flood

## 2021-05-27 ENCOUNTER — Ambulatory Visit: Payer: 59 | Admitting: Cardiology

## 2021-05-28 ENCOUNTER — Ambulatory Visit (HOSPITAL_COMMUNITY): Payer: Managed Care, Other (non HMO)

## 2021-05-28 ENCOUNTER — Other Ambulatory Visit: Payer: Self-pay | Admitting: Physician Assistant

## 2021-05-28 ENCOUNTER — Encounter (HOSPITAL_COMMUNITY): Payer: Self-pay

## 2021-05-28 DIAGNOSIS — J4550 Severe persistent asthma, uncomplicated: Secondary | ICD-10-CM

## 2021-05-28 NOTE — Telephone Encounter (Signed)
Requested medication (s) are due for refill today: no  Requested medication (s) are on the active medication list: yes  Last refill:  05/13/21  Future visit scheduled: 06/11/21  Notes to clinic:  policy states to notify provider if requesting early.   Requested Prescriptions  Pending Prescriptions Disp Refills   albuterol (PROVENTIL) (2.5 MG/3ML) 0.083% nebulizer solution 180 mL 0    Sig: Take 3 mLs (2.5 mg total) by nebulization every 4 (four) hours as needed for wheezing or shortness of breath (((PLAN B))).     Pulmonology:  Beta Agonists Failed - 05/28/2021 10:02 PM      Failed - One inhaler should last at least one month. If the patient is requesting refills earlier, contact the patient to check for uncontrolled symptoms.      Passed - Valid encounter within last 12 months    Recent Outpatient Visits           2 weeks ago Sinusitis, unspecified chronicity, unspecified location   [place], [place], [place]   3 months ago Hypertension associated with type 2 diabetes mellitus [facility])   [place], MD   8 months ago Hypertension associated with type 2 diabetes mellitus [facility])   [place], MD   1 year ago Type 2 diabetes mellitus with obesity [facility])   [place], Deborah B, MD   1 year ago Essential hypertension   [place], Jarome Matin, RPH-CPP       Future Appointments             In 2 weeks Ladell Pier, MD [place]   In 2 months [place], Oletta Darter, PA-C [facility] [place], [place]   In 2 months [place], Ocie Doyne, MD [place], [place]   In 5 months Donato Heinz, MD [place], [place]

## 2021-05-31 MED ORDER — ALBUTEROL SULFATE (2.5 MG/3ML) 0.083% IN NEBU
2.5000 mg | INHALATION_SOLUTION | RESPIRATORY_TRACT | 0 refills | Status: DC | PRN
  Filled 2021-05-31: qty 180, 15d supply, fill #0
  Filled 2021-05-31: qty 180, 10d supply, fill #0

## 2021-06-01 ENCOUNTER — Other Ambulatory Visit: Payer: Self-pay

## 2021-06-02 ENCOUNTER — Other Ambulatory Visit: Payer: Self-pay | Admitting: Internal Medicine

## 2021-06-02 DIAGNOSIS — K5909 Other constipation: Secondary | ICD-10-CM

## 2021-06-03 ENCOUNTER — Telehealth: Payer: Self-pay

## 2021-06-03 ENCOUNTER — Other Ambulatory Visit: Payer: Self-pay

## 2021-06-03 MED ORDER — LINACLOTIDE 72 MCG PO CAPS: 72.0000 ug | ORAL_CAPSULE | Freq: Every day | ORAL | 6 refills | Status: DC

## 2021-06-03 NOTE — Telephone Encounter (Signed)
Filled 05/31/21. Requested Prescriptions  Refused Prescriptions Disp Refills   albuterol (VENTOLIN HFA) 108 (90 Base) MCG/ACT inhaler 18 g 0    Sig: INHALE 2 PUFFS INTO THE LUNGS EVERY 4 (FOUR) HOURS AS NEEDED FOR WHEEZING OR SHORTNESS OF BREATH.     Pulmonology:  Beta Agonists Failed - 06/02/2021 10:38 PM      Failed - One inhaler should last at least one month. If the patient is requesting refills earlier, contact the patient to check for uncontrolled symptoms.      Passed - Valid encounter within last 12 months    Recent Outpatient Visits          3 weeks ago Sinusitis, unspecified chronicity, unspecified location   [place], [place], [place]   3 months ago Hypertension associated with type 2 diabetes mellitus [facility])   [place] Karle Plumber B, MD   9 months ago Hypertension associated with type 2 diabetes mellitus [facility])   [place], MD   1 year ago Type 2 diabetes mellitus with obesity [facility])   [place], Deborah B, MD   1 year ago Essential hypertension   [place], Jarome Matin, RPH-CPP      Future Appointments            In 1 week Ladell Pier, MD [place]   In 2 months [place], Oletta Darter, PA-C [facility] [place], [place]   In 2 months [place], Ocie Doyne, MD [place], [place]   In 5 months Donato Heinz, MD [place], [place]

## 2021-06-03 NOTE — Telephone Encounter (Signed)
PA approved until 63/87/5643 for Bystolic

## 2021-06-04 ENCOUNTER — Encounter: Payer: Self-pay | Admitting: Podiatry

## 2021-06-04 ENCOUNTER — Ambulatory Visit (INDEPENDENT_AMBULATORY_CARE_PROVIDER_SITE_OTHER): Payer: Managed Care, Other (non HMO) | Admitting: Podiatry

## 2021-06-04 ENCOUNTER — Other Ambulatory Visit: Payer: Self-pay

## 2021-06-04 DIAGNOSIS — M79675 Pain in left toe(s): Secondary | ICD-10-CM | POA: Diagnosis not present

## 2021-06-04 DIAGNOSIS — E119 Type 2 diabetes mellitus without complications: Secondary | ICD-10-CM

## 2021-06-04 DIAGNOSIS — N289 Disorder of kidney and ureter, unspecified: Secondary | ICD-10-CM

## 2021-06-04 DIAGNOSIS — B351 Tinea unguium: Secondary | ICD-10-CM

## 2021-06-04 DIAGNOSIS — M79674 Pain in right toe(s): Secondary | ICD-10-CM

## 2021-06-04 NOTE — Progress Notes (Signed)
This patient returns to my office for at risk foot care.  This patient requires this care by a professional since this patient will be at risk due to having diabetes.  This patient is unable to cut nails himself since the patient cannot reach his nails.These nails are painful walking and wearing shoes.  This patient presents for at risk foot care today.  General Appearance  Alert, conversant and in no acute stress.  Vascular  Dorsalis pedis and posterior tibial  pulses are palpable  bilaterally.  Capillary return is within normal limits  bilaterally. Temperature is within normal limits  bilaterally.  Neurologic  Senn-Weinstein monofilament wire test within normal limits  bilaterally. Muscle power within normal limits bilaterally.  Nails Thick disfigured discolored nails with subungual debris  from hallux to fifth toes bilaterally. No evidence of bacterial infection or drainage bilaterally.  Pincer nails  B/L.  Orthopedic  No limitations of motion  feet .  No crepitus or effusions noted.  No bony pathology or digital deformities noted.  Skin  normotropic skin with no porokeratosis noted bilaterally.  No signs of infections or ulcers noted.     Onychomycosis  Pain in right toes  Pain in left toes  Consent was obtained for treatment procedures.   Mechanical debridement of nails 1-5  bilaterally performed with a nail nipper.  Filed with dremel without incident.    Return office visit    3 months                  Told patient to return for periodic foot care and evaluation due to potential at risk complications.   Fatim Vanderschaaf DPM   

## 2021-06-06 ENCOUNTER — Encounter: Payer: Self-pay | Admitting: Internal Medicine

## 2021-06-06 ENCOUNTER — Other Ambulatory Visit: Payer: Self-pay | Admitting: Internal Medicine

## 2021-06-06 DIAGNOSIS — J4550 Severe persistent asthma, uncomplicated: Secondary | ICD-10-CM

## 2021-06-06 MED ORDER — ALBUTEROL SULFATE HFA 108 (90 BASE) MCG/ACT IN AERS: 2.0000 | INHALATION_SPRAY | RESPIRATORY_TRACT | 0 refills | Status: DC | PRN

## 2021-06-06 NOTE — Telephone Encounter (Signed)
Requested Prescriptions  Pending Prescriptions Disp Refills   albuterol (VENTOLIN HFA) 108 (90 Base) MCG/ACT inhaler 18 g 0    Sig: INHALE 2 PUFFS INTO THE LUNGS EVERY 4 (FOUR) HOURS AS NEEDED FOR WHEEZING OR SHORTNESS OF BREATH.     Pulmonology:  Beta Agonists Failed - 06/06/2021 12:59 PM      Failed - One inhaler should last at least one month. If the patient is requesting refills earlier, contact the patient to check for uncontrolled symptoms.      Passed - Valid encounter within last 12 months    Recent Outpatient Visits          3 weeks ago Sinusitis, unspecified chronicity, unspecified location   [place], [place], [place]   4 months ago Hypertension associated with type 2 diabetes mellitus [facility])   [place], MD   9 months ago Hypertension associated with type 2 diabetes mellitus [facility])   [place], MD   1 year ago Type 2 diabetes mellitus with obesity [facility])   [place], Deborah B, MD   1 year ago Essential hypertension   [place], Jarome Matin, RPH-CPP      Future Appointments            In 5 days Ladell Pier, MD [place]   In 2 months [place], Oletta Darter, PA-C [facility] [place], [place]   In 2 months [place], Ocie Doyne, MD [place], [facility]   In 5 months Donato Heinz, MD [facility] [place], [facility]            Previous refill was sent to [facility] that is permanently closed

## 2021-06-07 ENCOUNTER — Telehealth: Payer: Self-pay | Admitting: Pulmonary Disease

## 2021-06-07 ENCOUNTER — Telehealth: Payer: Self-pay

## 2021-06-07 ENCOUNTER — Other Ambulatory Visit: Payer: Self-pay

## 2021-06-07 NOTE — Telephone Encounter (Signed)
Called patient and patient stated that the pharmacy gave him the wrong inhaler.  Pt needs ventolin. States pharmacy gave him proair. Also states breztri runs out before refill time. [facility] [address]  Dr Silas Flood pleaase advise  Can I do the order for ventolin or do you want him on a different inhaler at

## 2021-06-07 NOTE — Telephone Encounter (Signed)
Joseph Harvey is not covered on patient's ins.  Trelegy and Advair are the preferred if therapy can be changed.

## 2021-06-08 ENCOUNTER — Other Ambulatory Visit: Payer: Self-pay

## 2021-06-08 NOTE — Telephone Encounter (Signed)
Will have new prescription of Trelegy 200 mcg dose 1 puff daily sent to his pharmacy.  Thank you.

## 2021-06-08 NOTE — Telephone Encounter (Signed)
Ventolin as needed is fine.  In addition, he will need a new prescription for Trelegy 200 mcg dose once daily.  Received message from PCP office that the Judithann Sauger was refused by insurance.

## 2021-06-09 ENCOUNTER — Other Ambulatory Visit: Payer: Self-pay

## 2021-06-09 ENCOUNTER — Telehealth: Payer: Self-pay | Admitting: Pharmacist

## 2021-06-09 ENCOUNTER — Other Ambulatory Visit: Payer: Self-pay | Admitting: Internal Medicine

## 2021-06-09 MED ORDER — TRELEGY ELLIPTA 200-62.5-25 MCG/ACT IN AEPB
1.0000 | INHALATION_SPRAY | Freq: Every day | RESPIRATORY_TRACT | 2 refills | Status: DC
  Filled 2021-06-11: qty 60, 30d supply, fill #0

## 2021-06-09 NOTE — Addendum Note (Signed)
Addended by: Karle Plumber B on: 06/09/2021 11:59 AM   Modules accepted: Orders

## 2021-06-09 NOTE — Telephone Encounter (Signed)
Requested medication (s) are due for refill today: Yes  Requested medication (s) are on the active medication list: No  Last refill:  N/A  Future visit scheduled: Yes  Notes to clinic:   Pharmacy comment: Alternative Requested:NOT COVERED BY INSURANCE.   All Pharmacy Suggested Alternatives:   fluticasone (FLOVENT HFA) 44 MCG/ACT inhaler Fluticasone Propionate, Inhal, (FLOVENT DISKUS) 50 MCG/ACT AEP[facility] umeclidinium-vilanterol (ANORO ELLIPTA) 62.5-25 MCG/ACT AEPB Fluticasone Furoate (ARNUITY ELLIPTA) 100 MCG/ACT AEPB       Requested Prescriptions  Pending Prescriptions Disp Refills   fluticasone (FLOVENT HFA) 44 MCG/ACT inhaler  0     Pulmonology:  Corticosteroids Passed - 06/09/2021 11:59 AM      Passed - Valid encounter within last 12 months    Recent Outpatient Visits           4 weeks ago Sinusitis, unspecified chronicity, unspecified location   [place], [place], [place]   4 months ago Hypertension associated with type 2 diabetes mellitus [facility])   [place] Karle Plumber B, MD   9 months ago Hypertension associated with type 2 diabetes mellitus [facility])   [place], MD   1 year ago Type 2 diabetes mellitus with obesity [facility])   [place], Deborah B, MD   1 year ago Essential hypertension   [place], RPH-CPP       Future Appointments             In 2 days Ladell Pier, MD [place]   In 1 month [place], Goodwell K, PA-C [facility] [place], [place]   In 2 months [place], Ocie Doyne, MD [place], [facility]   In 5 months Donato Heinz, MD [place], [facility]

## 2021-06-09 NOTE — Telephone Encounter (Signed)
Phone call placed to patient this a.m.  I left a message on his voicemail informing him that I wanted to follow-up with him regarding the inhalers.  I let him know I was informed that his insurance will no longer pay for the [facility]  Trelegy or Advair preferred instead.  I reached out to his pulmonologist and he has recommended the Trelegy so I went ahead and sent a prescription to his pharmacy.  In regards to his concern about getting ProAir instead of Ventolin inhaler, I informed him that the prescription was written for Ventolin but it may be an insurance issue that he got ProAir.  I will have our clinical pharmacist look into it and get back to him.  Message sent to clinical pharmacist.

## 2021-06-09 NOTE — Telephone Encounter (Signed)
Notes to clinic:  pharm sent request as follows Pharmacy comment: Alternative Requested:NON FORMULARY.   Requested Prescriptions  Pending Prescriptions Disp Refills   FLOVENT HFA 44 MCG/ACT inhaler [Pharmacy Med Name: [place] [facility]FA 44 [place]  0     Pulmonology:  Corticosteroids Passed - 06/09/2021  5:01 PM      Passed - Valid encounter within last 12 months    Recent Outpatient Visits           4 weeks ago Sinusitis, unspecified chronicity, unspecified location   [place], [place], [place]   4 months ago Hypertension associated with type 2 diabetes mellitus [facility])   [place] Karle Plumber B, MD   9 months ago Hypertension associated with type 2 diabetes mellitus [facility])   [place], MD   1 year ago Type 2 diabetes mellitus with obesity [facility])   [place], Deborah B, MD   1 year ago Essential hypertension   [place], Jarome Matin, RPH-CPP       Future Appointments             In 2 days Ladell Pier, MD [place]   In 1 month [place], [place] K, PA-C [facility] [place], [place]   In 2 months [place], Ocie Doyne, MD [place], [place]   In 5 months Donato Heinz, MD [place], [facility]

## 2021-06-09 NOTE — Telephone Encounter (Signed)
Joseph Harvey,   Dr. Wynetta Emery messaged me concerning this patient. He reports to [place] that he can only take Ventolin but his pharmacy gives him Proair. His [employer] prefers [employer]. Therefore, if we send a DAW-1 for Ventolin they will likely reject and require a PA. Are we able to start one for Ventolin?

## 2021-06-10 ENCOUNTER — Other Ambulatory Visit: Payer: Self-pay | Admitting: Internal Medicine

## 2021-06-10 ENCOUNTER — Other Ambulatory Visit: Payer: Self-pay

## 2021-06-10 MED ORDER — VENTOLIN HFA 108 (90 BASE) MCG/ACT IN AERS: 2.0000 | INHALATION_SPRAY | RESPIRATORY_TRACT | 6 refills | Status: DC | PRN

## 2021-06-10 NOTE — Telephone Encounter (Signed)
Dr. Wynetta Emery,   Forwarding this request to you since his insurance does not cover the Flovent HFA. Covered alternatives [facility] are listed below:   -Fluticasone Propionate, Inhal, (FLOVENT DISKUS) 50 MCG/ACT AEPB -umeclidinium-vilanterol (ANORO ELLIPTA) 62.5-25 MCG/ACT AEPB -Fluticasone Furoate (ARNUITY ELLIPTA) 100 MCG/ACT AEPB

## 2021-06-10 NOTE — Telephone Encounter (Signed)
Dr. Wynetta Emery,   Wanted to route this conversation thread to you. Patient has what he needs from the pharmacy.

## 2021-06-11 ENCOUNTER — Telehealth: Payer: Self-pay

## 2021-06-11 ENCOUNTER — Ambulatory Visit: Payer: Managed Care, Other (non HMO) | Attending: Internal Medicine | Admitting: Internal Medicine

## 2021-06-11 ENCOUNTER — Other Ambulatory Visit: Payer: Self-pay

## 2021-06-11 ENCOUNTER — Other Ambulatory Visit: Payer: Self-pay | Admitting: Internal Medicine

## 2021-06-11 DIAGNOSIS — E1159 Type 2 diabetes mellitus with other circulatory complications: Secondary | ICD-10-CM | POA: Diagnosis not present

## 2021-06-11 DIAGNOSIS — E669 Obesity, unspecified: Secondary | ICD-10-CM

## 2021-06-11 DIAGNOSIS — K5909 Other constipation: Secondary | ICD-10-CM

## 2021-06-11 DIAGNOSIS — J4550 Severe persistent asthma, uncomplicated: Secondary | ICD-10-CM

## 2021-06-11 DIAGNOSIS — I152 Hypertension secondary to endocrine disorders: Secondary | ICD-10-CM

## 2021-06-11 DIAGNOSIS — E1169 Type 2 diabetes mellitus with other specified complication: Secondary | ICD-10-CM | POA: Diagnosis not present

## 2021-06-11 MED ORDER — UMECLIDINIUM-VILANTEROL 62.5-25 MCG/ACT IN AEPB: 1.0000 | INHALATION_SPRAY | Freq: Every day | RESPIRATORY_TRACT | 5 refills | Status: DC

## 2021-06-11 MED ORDER — FLUTICASONE PROPIONATE HFA 220 MCG/ACT IN AERO: 1.0000 | INHALATION_SPRAY | Freq: Every day | RESPIRATORY_TRACT | 12 refills | Status: DC

## 2021-06-11 MED ORDER — FLOVENT HFA 220 MCG/ACT IN AERO: 1.0000 | INHALATION_SPRAY | Freq: Every day | RESPIRATORY_TRACT | 12 refills | Status: DC

## 2021-06-11 NOTE — Telephone Encounter (Signed)
Requested medication (s) are due for refill today:   Yes  Requested medication (s) are on the active medication list:   Yes  Future visit scheduled:   Yes   Last ordered: 06/09/2021 #60 each, 2 refills  Returned because pharmacy requesting an alternative   Requested Prescriptions  Pending Prescriptions Disp Refills   FLOVENT HFA 44 MCG/ACT inhaler [Pharmacy Med Name: [place] [facility]FA 44 [place]  0     Pulmonology:  Corticosteroids Passed - 06/10/2021  4:56 PM      Passed - Valid encounter within last 12 months    Recent Outpatient Visits           1 month ago Sinusitis, unspecified chronicity, unspecified location   [place], [place], [place]   4 months ago Hypertension associated with type 2 diabetes mellitus [facility])   [place] Karle Plumber B, MD   9 months ago Hypertension associated with type 2 diabetes mellitus [facility])   [place], MD   1 year ago Type 2 diabetes mellitus with obesity [facility])   [place], Deborah B, MD   1 year ago Essential hypertension   [place], Jarome Matin, RPH-CPP       Future Appointments             Today Ladell Pier, MD [place]   In 1 month [place], [place] K, PA-C [facility] [place], [place]   In 2 months [place], Ocie Doyne, MD [place], [place]   In 5 months Donato Heinz, MD [place], [facility]

## 2021-06-11 NOTE — Telephone Encounter (Signed)
Patient's Trelegy copay is $50.  He was wanting to know if a different therapy would work.  I did see Flovent being discussed and the copay for Flovent is $15.  He had mentioned Anoro(info possibly given by ins) which is also $15.

## 2021-06-11 NOTE — Progress Notes (Signed)
Virtual Visit via Telephone Note  I connected with Joseph Harvey on 06/11/2021 at 8:41 AM by telephone and verified that I am speaking with the correct person using two identifiers  Location: Patient: home Provider: office  Participants: Myself Patient   I discussed the limitations, risks, security and privacy concerns of performing an evaluation and management service by telephone and the availability of in person appointments. I also discussed with the patient that there may be a patient responsible charge related to this service. The patient expressed understanding and agreed to proceed.   History of Present Illness: DM controlled, HL, HTN, HCM (followed by cardiologist Dr. 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   Asthma:  Patient sent me WhatsApp message earlier this week outlining problems he is having with getting his inhalers.  He was dispense ProAir from the pharmacy which does not work for him.  He prefers the Ventolin.  Prescription was written as albuterol/Ventolin.  ProAir is preferred by his insurance.  He spoke with one of our pharmacy tech and was given a good Rx coupon for [facility] for the Ventolin which she can get at a reduced price of $28.  He plans to do that. His insurance will no longer cover for [facility] inhaler.  I was told that Trelegy or Advair would be preferred.  I sent the prescription for the Trelegy but patient has not picked it up as yet. Uses Albuterol neb often, ran out quicker Uses albuterol nebulizer treatment whenever he has mild shortness of breath.  He uses the DuoNeb when his breathing is worse.   Still smoking off and on trying to quit  HCM/HTN: Has ablation procedure 08/10/2021 He checks his blood pressure intermittently.  Reports BP range of  118-122/82 No LE edema He is compliant with taking blood pressure medications including amlodipine, hydrochlorothiazide, Diovan.  I note that on his last history, his calcium level was mildly elevated at 10.7.  Constipation: His gastroenterologist decrease the dose of the Linzess from 142 mg to 72 mg.  The higher dose was causing too frequent bowel movements for him.    DM: checks BS every now and then.  Takes Metformin PRN.  Takes Metformin for 3 days post Prednisone.   Last A1C 6.6 done in Sept  Wants PSA check. Hx of prost CA s/p radical prostatectomy.  Last check in June 2022.  He plans to call his urologist to get PSA level done. Outpatient Encounter Medications as of 06/11/2021  Medication Sig Note   albuterol (PROVENTIL) (2.5 MG/3ML) 0.083% nebulizer solution Take 3 mLs (2.5 mg total) by nebulization every 4 (four) hours as needed for wheezing or shortness of breath (((PLAN B))).    amLODipine (NORVASC) 5 MG tablet TAKE 1 TABLET (5 MG TOTAL) BY MOUTH 2 (TWO) TIMES DAILY.    ASPIRIN 81 PO Take 81 mg by mouth daily.    baclofen (LIORESAL) 10 MG tablet take 1 tablet by mouth 3 times a day    benzonatate (TESSALON) 100 MG capsule Take 1 capsule (100 mg total) by mouth every 8 (eight) hours. 05/05/2021: Patient takes as needed   Blood Glucose Monitoring Suppl (TRUE METRIX METER) w/Device KIT Use as directed    Budeson-Glycopyrrol-Formoterol (BREZTRI AEROSPHERE) 160-9-4.8 MCG/ACT AERO Inhale 2 puffs into the lungs in the morning and at bedtime.    buPROPion (WELLBUTRIN XL)  150 MG 24 hr tablet TAKE 1 TABLET (150 MG TOTAL) BY MOUTH DAILY.    busPIRone (BUSPAR) 10 MG tablet Take 1 tablet (10 mg total) by mouth 3 (three) times daily.    Cholecalciferol (VITAMIN D PO) Take 1 tablet by mouth daily.    doxycycline (VIBRA-TABS) 100 MG tablet Take 1 tablet (100 mg total) by mouth 2 (two) times daily.    ezetimibe (ZETIA) 10 MG tablet Take 1 tablet (10 mg total) by mouth daily.    fluticasone (FLONASE)  50 MCG/ACT nasal spray PLACE 2 SPRAYS INTO BOTH NOSTRILS DAILY.    Fluticasone-Umeclidin-Vilant (TRELEGY ELLIPTA) 200-62.5-25 MCG/ACT AEPB Inhale 1 puff into the lungs daily.    gabapentin (NEURONTIN) 300 MG capsule take 1 capsule by mouth in the morning, then take 1 at lunch and 2 at bedtime 05/05/2021: Patient takes as needed   glucose blood (TRUE METRIX BLOOD GLUCOSE TEST) test strip Use as instructed    hydrochlorothiazide (HYDRODIURIL) 25 MG tablet Take 1 tablet (25 mg total) by mouth daily.    HYDROcodone bit-homatropine (HYCODAN) 5-1.5 MG/5ML syrup Take 5 mLs by mouth every 6 (six) hours as needed for cough.    ipratropium-albuterol (DUONEB) 0.5-2.5 (3) MG/3ML SOLN USE 1 VIAL VIA NEBULIZER TWICE A DAY    linaclotide (LINZESS) 72 MCG capsule Take 1 capsule (72 mcg total) by mouth daily before breakfast.    metFORMIN (GLUCOPHAGE-XR) 500 MG 24 hr tablet TAKE 1 TABLET BY MOUTH DAILY.    montelukast (SINGULAIR) 10 MG tablet Take 1 tablet (10 mg total) by mouth at bedtime.    Nebivolol HCl 20 MG TABS Take 1 tablet (20 mg total) by mouth 2 (two) times daily.    pantoprazole (PROTONIX) 40 MG tablet Take 1 tablet (40 mg total) by mouth daily as needed.    polyethylene glycol powder (GLYCOLAX/MIRALAX) 17 GM/SCOOP powder Take 17 g by mouth daily as needed.    potassium chloride SA (KLOR-CON) 20 MEQ tablet Take 20 mEq by mouth as needed. When hands cramp up    predniSONE (DELTASONE) 10 MG tablet     Respiratory Therapy Supplies (FLUTTER) DEVI Use three times a day after inhaler or nebulizer use    rosuvastatin (CRESTOR) 40 MG tablet Take 1 tablet (40 mg total) by mouth daily.    TRUEplus Lancets 28G MISC Use as directed    valsartan (DIOVAN) 320 MG tablet Take 1 tablet (320 mg total) by mouth daily.    VENTOLIN HFA 108 (90 Base) MCG/ACT inhaler Inhale 2 puffs into the lungs every 4 (four) hours as needed for wheezing or shortness of breath.    No facility-administered encounter medications on file as  of 06/11/2021.      Observations/Objective: No direct observation done as this was a telephone encounter. Lab Results  Component Value Date   HGBA1C 6.6 02/04/2021     Chemistry      Component Value Date/Time   NA 140 04/14/2021 0000   K 3.8 04/14/2021 0000   CL 97 04/14/2021 0000   CO2 29 04/14/2021 0000   BUN 15 04/14/2021 0000   CREATININE 1.26 04/14/2021 0000      Component Value Date/Time   CALCIUM 10.7 (H) 04/14/2021 0000   ALKPHOS 68 03/25/2021 0926   AST 14 03/25/2021 0926   ALT 17 03/25/2021 0926   BILITOT 0.4 03/25/2021 0926       Assessment and Plan: 1. Severe persistent asthma without complication We had change Breztri to [employer].  However by the  time that I sat down to complete this note, I was notified by our pharmacy tech that the Trelegy out-of-pocket cost for him with insurance is $50.  Patient wanted to see if it could be changed to something cheaper.  We decided to go with Flovent and Anoro.  2. Hypertension associated with type 2 diabetes mellitus ([place]) Reported blood pressure readings are at goal.  Continue current medications and low-salt diet.  3. Type 2 diabetes mellitus with obesity ([place]) Patient continues to take metformin as needed.  Last A1c was at goal.  He will come to the lab to have his A1c checked. - Hemoglobin A1c; Future  4. Hypercalcemia Likely due to the addition of hydrochlorothiazide.  Nonetheless we will check thyroid, parathyroid and vitamin D levels. - TSH; Future - PTH, Intact and Calcium; Future - VITAMIN D 25 Hydroxy (Vit-D Deficiency, Fractures); Future  5. Chronic constipation Better on lower dose of Linzess.   Follow Up Instructions: 4 mths   I discussed the assessment and treatment plan with the patient. The patient was provided an opportunity to ask questions and all were answered. The patient agreed with the plan and demonstrated an understanding of the instructions.   The patient was advised to call back or  seek an in-person evaluation if the symptoms worsen or if the condition fails to improve as anticipated.  I  Spent 21 minutes on this telephone encounter  This note has been created with Surveyor, quantity. Any transcriptional errors are unintentional.  Karle Plumber, MD

## 2021-06-14 ENCOUNTER — Other Ambulatory Visit: Payer: Self-pay | Admitting: Pulmonary Disease

## 2021-06-14 ENCOUNTER — Other Ambulatory Visit: Payer: Self-pay

## 2021-06-14 MED ORDER — TRELEGY ELLIPTA 100-62.5-25 MCG/ACT IN AEPB: 2.0000 | INHALATION_SPRAY | Freq: Two times a day (BID) | RESPIRATORY_TRACT | 2 refills | Status: DC

## 2021-06-14 MED ORDER — VENTOLIN HFA 108 (90 BASE) MCG/ACT IN AERS: 2.0000 | INHALATION_SPRAY | RESPIRATORY_TRACT | 6 refills | Status: DC | PRN

## 2021-06-14 NOTE — Telephone Encounter (Signed)
Called and spoke with patient about the ventolin inhaler being reordered and a new prescription for trelegy and how to use it. Pt voiced understanding Nothing else needed

## 2021-06-15 ENCOUNTER — Other Ambulatory Visit: Payer: Self-pay

## 2021-06-15 ENCOUNTER — Ambulatory Visit: Payer: Managed Care, Other (non HMO) | Attending: Internal Medicine

## 2021-06-15 DIAGNOSIS — E1169 Type 2 diabetes mellitus with other specified complication: Secondary | ICD-10-CM

## 2021-06-16 ENCOUNTER — Other Ambulatory Visit: Payer: Self-pay | Admitting: Internal Medicine

## 2021-06-16 DIAGNOSIS — J4550 Severe persistent asthma, uncomplicated: Secondary | ICD-10-CM

## 2021-06-16 LAB — PTH, INTACT AND CALCIUM
Calcium: 10.3 mg/dL — ABNORMAL HIGH (ref 8.6–10.2)
PTH: 20 pg/mL (ref 15–65)

## 2021-06-16 LAB — HEMOGLOBIN A1C
Est. average glucose Bld gHb Est-mCnc: 163 mg/dL
Hgb A1c MFr Bld: 7.3 % — ABNORMAL HIGH (ref 4.8–5.6)

## 2021-06-16 LAB — TSH: TSH: 0.718 u[IU]/mL (ref 0.450–4.500)

## 2021-06-16 LAB — VITAMIN D 25 HYDROXY (VIT D DEFICIENCY, FRACTURES): Vit D, 25-Hydroxy: 31.2 ng/mL (ref 30.0–100.0)

## 2021-06-17 NOTE — Telephone Encounter (Signed)
Requested medication (s) are due for refill today: requesting early  Requested medication (s) are on the active medication list: yes  Last refill:  06/01/21  Future visit scheduled: 09/24/21  Notes to clinic:  requesting early, however, I do not want to refuse respiratory treatments, please assess.  Requested Prescriptions  Pending Prescriptions Disp Refills   albuterol (PROVENTIL) (2.5 MG/3ML) 0.083% nebulizer solution 180 mL 0    Sig: Take 3 mLs (2.5 mg total) by nebulization every 4 (four) hours as needed for wheezing or shortness of breath (((PLAN B))).     Pulmonology:  Beta Agonists 2 Passed - 06/16/2021  4:42 PM      Passed - Last BP in normal range    BP Readings from Last 1 Encounters:  05/26/21 126/64          Passed - Last Heart Rate in normal range    Pulse Readings from Last 1 Encounters:  05/26/21 94          Passed - Valid encounter within last 12 months    Recent Outpatient Visits           6 days ago Severe persistent asthma without complication   [place] Ladell Pier, MD   1 month ago Sinusitis, unspecified chronicity, unspecified location   [place], [place], [place]   4 months ago Hypertension associated with type 2 diabetes mellitus [facility])   [place], MD   9 months ago Hypertension associated with type 2 diabetes mellitus [facility])   [place], MD   1 year ago Type 2 diabetes mellitus with obesity [facility])   [place], Deborah B, MD       Future Appointments             In 1 month [place], Oletta Darter, PA-C [facility] [place], [facility]   In 2 months [place], Ocie Doyne, MD [place], [facility]   In 3 months Wynetta Emery, Dalbert Batman, MD [place]   In 4 months Donato Heinz,  MD [facility] [place], [facility]

## 2021-06-23 ENCOUNTER — Other Ambulatory Visit: Payer: Self-pay | Admitting: Internal Medicine

## 2021-06-23 ENCOUNTER — Other Ambulatory Visit: Payer: Self-pay

## 2021-06-23 ENCOUNTER — Encounter: Payer: Self-pay | Admitting: Internal Medicine

## 2021-06-23 DIAGNOSIS — J4550 Severe persistent asthma, uncomplicated: Secondary | ICD-10-CM

## 2021-06-23 MED ORDER — ALBUTEROL SULFATE (2.5 MG/3ML) 0.083% IN NEBU
2.5000 mg | INHALATION_SOLUTION | RESPIRATORY_TRACT | 2 refills | Status: DC | PRN
  Filled 2021-06-24 – 2021-07-12 (×3): qty 180, 15d supply, fill #0
  Filled 2021-07-26: qty 180, 15d supply, fill #1

## 2021-06-23 NOTE — Telephone Encounter (Signed)
Requested medication (s) are due for refill today: yes  Requested medication (s) are on the active medication list: yes  Last refill:  05/31/21 #180 ml 0 refills  Future visit scheduled: yes in 3 months   Notes to clinic:  do you want to give 3 months supply?     Requested Prescriptions  Pending Prescriptions Disp Refills   albuterol (PROVENTIL) (2.5 MG/3ML) 0.083% nebulizer solution 180 mL 0    Sig: Take 3 mLs (2.5 mg total) by nebulization every 4 (four) hours as needed for wheezing or shortness of breath (((PLAN B))).     Pulmonology:  Beta Agonists 2 Passed - 06/23/2021  9:13 AM      Passed - Last BP in normal range    BP Readings from Last 1 Encounters:  05/26/21 126/64          Passed - Last Heart Rate in normal range    Pulse Readings from Last 1 Encounters:  05/26/21 94          Passed - Valid encounter within last 12 months    Recent Outpatient Visits           1 week ago Severe persistent asthma without complication   [place] Ladell Pier, MD   1 month ago Sinusitis, unspecified chronicity, unspecified location   [place], [place], [place]   4 months ago Hypertension associated with type 2 diabetes mellitus [facility])   [place], MD   9 months ago Hypertension associated with type 2 diabetes mellitus [facility])   [place], MD   1 year ago Type 2 diabetes mellitus with obesity [facility])   [place], Deborah B, MD       Future Appointments             In 1 month [place], Oletta Darter, PA-C [facility] [place], [place]   In 1 month [place], [place], MD [place], [facility]   In 3 months Wynetta Emery, Dalbert Batman, MD [place]   In 4 months Donato Heinz, MD [place], [facility]

## 2021-06-24 ENCOUNTER — Other Ambulatory Visit: Payer: Self-pay

## 2021-06-30 ENCOUNTER — Other Ambulatory Visit: Payer: Self-pay

## 2021-07-02 ENCOUNTER — Ambulatory Visit: Payer: Managed Care, Other (non HMO) | Attending: Internal Medicine | Admitting: Internal Medicine

## 2021-07-02 ENCOUNTER — Encounter: Payer: Self-pay | Admitting: Internal Medicine

## 2021-07-02 ENCOUNTER — Other Ambulatory Visit (HOSPITAL_BASED_OUTPATIENT_CLINIC_OR_DEPARTMENT_OTHER): Payer: Self-pay

## 2021-07-02 ENCOUNTER — Other Ambulatory Visit: Payer: Self-pay | Admitting: Physician Assistant

## 2021-07-02 DIAGNOSIS — J4550 Severe persistent asthma, uncomplicated: Secondary | ICD-10-CM | POA: Diagnosis not present

## 2021-07-02 DIAGNOSIS — J329 Chronic sinusitis, unspecified: Secondary | ICD-10-CM

## 2021-07-02 MED ORDER — BUDESONIDE-FORMOTEROL FUMARATE 160-4.5 MCG/ACT IN AERO
2.0000 | INHALATION_SPRAY | Freq: Two times a day (BID) | RESPIRATORY_TRACT | 3 refills | Status: DC
  Filled 2021-07-02 – 2021-07-05 (×4): qty 10.2, 30d supply, fill #0

## 2021-07-02 MED ORDER — IPRATROPIUM-ALBUTEROL 0.5-2.5 (3) MG/3ML IN SOLN
RESPIRATORY_TRACT | 6 refills | Status: DC
  Filled 2021-08-16: qty 90, 15d supply, fill #0
  Filled 2021-09-05: qty 90, 15d supply, fill #1
  Filled 2021-09-22: qty 180, 30d supply, fill #2
  Filled 2021-10-24: qty 180, 30d supply, fill #3
  Filled 2021-11-27: qty 180, 30d supply, fill #4

## 2021-07-02 NOTE — Progress Notes (Signed)
Patient ID: Joseph Harvey, male   DOB: 03/06/1956, 66 y.o.   MRN: [id number] Virtual Visit via Telephone Note  I connected with Joseph Harvey on 07/02/2021 at 3:11 p.m by telephone and verified that I am speaking with the correct person using two identifiers  Location: Patient: home Provider: office  Participants: Myself Patient   I discussed the limitations, risks, security and privacy concerns of performing an evaluation and management service by telephone and the availability of in person appointments. I also discussed with the patient that there may be a patient responsible charge related to this service. The patient expressed understanding and agreed to proceed.   History of Present Illness: DM Type2, HL, HTN, HCM (followed by cardiologist Dr. 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  This is an urgent care visit. Patient thinks he has sinus infection. He tells me that his sinuses feel stuffy and he has been sneezing a lot.  He has had some rhinorrhea sometimes brown but mostly clear.  No pain over the sinuses or fever. -Using Q-tip to clean his nose and on sinus rinse with Nettie pot. Confirms using Flonase and Singulair. He wakes up in the mornings with crusting of the eyelids for the past 1 week.  The medial corner of the right eye is puffy. He had a negative home COVID test.  His other concern is with his asthma.  Albuterol inhaler worked well for him but his insurance would not cover albuterol so we had to change it to Ventolin/ProAir.  Reports feeling more short of breath when he uses the ProAir. Insurance also did not cover [place] so we had to change to [employer].  Reports that this combination is not working for him.  He felt he did better with Symbicort. [facility]. He will be changing his insurance in May   Outpatient Encounter Medications as of 07/02/2021  Medication Sig Note   albuterol (PROVENTIL) (2.5 MG/3ML) 0.083% nebulizer solution Take 3 mLs (2.5 mg total) by nebulization every 4 (four) hours as needed for wheezing or shortness of breath (((PLAN B))).    amLODipine (NORVASC) 5 MG tablet TAKE 1 TABLET (5 MG TOTAL) BY MOUTH 2 (TWO) TIMES DAILY.    ASPIRIN 81 PO Take 81 mg by mouth daily.    baclofen (LIORESAL) 10 MG tablet take 1 tablet by mouth 3 times a day    benzonatate (TESSALON) 100 MG capsule Take 1 capsule (100 mg total) by mouth every 8 (eight) hours. 05/05/2021: Patient takes as needed   Blood Glucose Monitoring Suppl (TRUE METRIX METER) w/Device KIT Use as directed    buPROPion (WELLBUTRIN XL) 150 MG 24 hr tablet TAKE 1 TABLET (150 MG TOTAL) BY MOUTH DAILY.    busPIRone (BUSPAR) 10 MG tablet Take 1 tablet (10 mg total) by mouth 3 (three) times daily.    Cholecalciferol (VITAMIN D PO) Take 1 tablet by mouth daily.    doxycycline (VIBRA-TABS) 100 MG tablet Take 1 tablet (100 mg total) by mouth 2 (two) times daily.    ezetimibe (ZETIA) 10 MG tablet Take 1 tablet (10 mg total) by mouth daily.    FLOVENT HFA 220 MCG/ACT inhaler Inhale 1 puff into the lungs daily.    fluticasone (FLONASE) 50 MCG/ACT nasal spray PLACE 2 SPRAYS INTO BOTH NOSTRILS DAILY.  gabapentin (NEURONTIN) 300 MG capsule take 1 capsule by mouth in the morning, then take 1 at lunch and 2 at bedtime 05/05/2021: Patient takes as needed   glucose blood (TRUE METRIX BLOOD GLUCOSE TEST) test strip Use as instructed    hydrochlorothiazide (HYDRODIURIL) 25 MG tablet Take 1 tablet (25 mg total) by mouth daily.    HYDROcodone bit-homatropine (HYCODAN) 5-1.5 MG/5ML syrup Take 5 mLs by mouth every 6 (six) hours as needed for cough.    ipratropium-albuterol (DUONEB) 0.5-2.5 (3) MG/3ML SOLN USE 1 VIAL VIA NEBULIZER TWICE A DAY    linaclotide (LINZESS) 72 MCG  capsule Take 1 capsule (72 mcg total) by mouth daily before breakfast.    metFORMIN (GLUCOPHAGE-XR) 500 MG 24 hr tablet TAKE 1 TABLET BY MOUTH DAILY.    montelukast (SINGULAIR) 10 MG tablet Take 1 tablet (10 mg total) by mouth at bedtime.    Nebivolol HCl 20 MG TABS Take 1 tablet (20 mg total) by mouth 2 (two) times daily.    pantoprazole (PROTONIX) 40 MG tablet Take 1 tablet (40 mg total) by mouth daily as needed.    polyethylene glycol powder (GLYCOLAX/MIRALAX) 17 GM/SCOOP powder Take 17 g by mouth daily as needed.    potassium chloride SA (KLOR-CON) 20 MEQ tablet Take 20 mEq by mouth as needed. When hands cramp up    predniSONE (DELTASONE) 10 MG tablet     Respiratory Therapy Supplies (FLUTTER) DEVI Use three times a day after inhaler or nebulizer use    rosuvastatin (CRESTOR) 40 MG tablet Take 1 tablet (40 mg total) by mouth daily.    TRUEplus Lancets 28G MISC Use as directed    umeclidinium-vilanterol (ANORO ELLIPTA) 62.5-25 MCG/ACT AEPB Inhale 1 puff into the lungs daily at 6 (six) AM.    valsartan (DIOVAN) 320 MG tablet Take 1 tablet (320 mg total) by mouth daily.    VENTOLIN HFA 108 (90 Base) MCG/ACT inhaler Inhale 2 puffs into the lungs every 4 (four) hours as needed for wheezing or shortness of breath.    No facility-administered encounter medications on file as of 07/02/2021.      Observations/Objective: Patient does not sound short of breath.  He is talking in full sentences.  Assessment and Plan: 1. Severe persistent asthma with allergic rhinitis without complication -Informed patient that current symptoms sounds more like sinus congestion with allergies not sinus infection.  Recommend that he continue the Flonase and Singulair.  May want to try Zyrtec or Claritin for several days.  Warm compresses to the eyes. I will send a prescription to our pharmacy for Symbicort to see whether it would be covered by his insurance.  If it is he will stop the Anoro plus Flovent. Message will  be sent to our pharmacy tech Claiborne Billings to see whether we can get a prior approval on the albuterol inhaler instead of the ProAir since the formal works better for him. - budesonide-formoterol (SYMBICORT) 160-4.5 MCG/ACT inhaler; Inhale 2 puffs into the lungs 2 (two) times daily.  Dispense: 10.2 g; Refill: 3   Follow Up Instructions: PRN   I discussed the assessment and treatment plan with the patient. The patient was provided an opportunity to ask questions and all were answered. The patient agreed with the plan and demonstrated an understanding of the instructions.   The patient was advised to call back or seek an in-person evaluation if the symptoms worsen or if the condition fails to improve as anticipated.  I  Spent 18 minutes on this  telephone encounter  This note has been created with Surveyor, quantity. Any transcriptional errors are unintentional.  Karle Plumber, MD

## 2021-07-03 ENCOUNTER — Other Ambulatory Visit (HOSPITAL_COMMUNITY): Payer: Self-pay

## 2021-07-05 ENCOUNTER — Telehealth: Payer: Self-pay | Admitting: Pulmonary Disease

## 2021-07-05 ENCOUNTER — Other Ambulatory Visit: Payer: Self-pay

## 2021-07-05 ENCOUNTER — Telehealth: Payer: Self-pay | Admitting: Internal Medicine

## 2021-07-05 NOTE — Telephone Encounter (Signed)
Called patient and he states that he feels like his inhalers are not working. He wants to know if MH could change his inhalers to something different.   Dr Silas Flood please advise

## 2021-07-05 NOTE — Telephone Encounter (Signed)
-----   Message from Rickey Barbara, CPhT sent at 07/03/2021  9:04 AM EST ----- I initiated a PA today, waiting on response. ----- Message ----- From: Ladell Pier, MD Sent: 07/03/2021   8:21 AM EST To: Tresa Endo, RPH-CPP, #  This pt's insurance will not cover for Ventolin/Albuterol inhaler which pt prefers.  It covers ProAir.  Patient states he has been using the ProAir and he feels it makes him more short of breath.  Is there any way we can try to do a prior approval to get the Ventolin inhaler back?

## 2021-07-06 ENCOUNTER — Other Ambulatory Visit (HOSPITAL_COMMUNITY): Payer: Self-pay

## 2021-07-06 ENCOUNTER — Other Ambulatory Visit: Payer: Self-pay | Admitting: Pulmonary Disease

## 2021-07-06 ENCOUNTER — Telehealth: Payer: Self-pay

## 2021-07-06 ENCOUNTER — Encounter: Payer: Self-pay | Admitting: Pulmonary Disease

## 2021-07-06 MED ORDER — BREZTRI AEROSPHERE 160-9-4.8 MCG/ACT IN AERO: 2.0000 | INHALATION_SPRAY | Freq: Two times a day (BID) | RESPIRATORY_TRACT | 11 refills | Status: DC

## 2021-07-06 NOTE — Telephone Encounter (Signed)
Left message for patient to call back  

## 2021-07-06 NOTE — Telephone Encounter (Signed)
Ideally we would go back to [place] since symptoms were better controlled on this. It would also consolidate multiple inhalers back to a single inhaler.

## 2021-07-06 NOTE — Telephone Encounter (Signed)
Patient Advocate Encounter  Prior Authorization for Judithann Sauger has been approved.    PA# [id number]  Effective dates: 07/06/21 through 07/06/22  Per Test Claim Patients co-pay is $40.   Spoke with Pharmacy to Process.  Patient Advocate Fax: [phone]

## 2021-07-06 NOTE — Telephone Encounter (Signed)
Patient Advocate Encounter   Received notification from patient calls that prior authorization for Joseph Harvey is required by his/her insurance [employer].   PA submitted on 07/06/21  Key#: [id number]  Status is pending    [facility] will continue to follow:  Patient Advocate Fax: [phone]

## 2021-07-06 NOTE — Telephone Encounter (Signed)
These were all changed 4 days ago via PCP. Previously on Breztri but switched to Trelegy last month per insurance coverage. Would recommend resuming Breztri. New script sent today. Can the patient be called and instructed to stop current meds (anoro, symbicort) and start Breztri once we get this for him. Continue current inhalers for now. In the future, have patient call us before changing inhalers.   Routing to [employer] team for further assistance with insurance coverage for [employer].

## 2021-07-08 NOTE — Telephone Encounter (Signed)
Called and left voicemail for patient to call office back  

## 2021-07-08 NOTE — Telephone Encounter (Signed)
Patient is returning phone call. Patient phone number is [phone].

## 2021-07-08 NOTE — Telephone Encounter (Signed)
Checked status of the PA for [facility] and saw that the PA was approved. Called and spoke with pt to see if he was able to pick up Rx for [facility] from pharmacy and he said that he had. Nothing further needed.

## 2021-07-11 ENCOUNTER — Other Ambulatory Visit: Payer: Self-pay | Admitting: Internal Medicine

## 2021-07-11 DIAGNOSIS — K219 Gastro-esophageal reflux disease without esophagitis: Secondary | ICD-10-CM

## 2021-07-12 ENCOUNTER — Other Ambulatory Visit: Payer: Self-pay

## 2021-07-12 MED ORDER — MONTELUKAST SODIUM 10 MG PO TABS
10.0000 mg | ORAL_TABLET | Freq: Every day | ORAL | 2 refills | Status: DC
  Filled 2021-07-12: qty 30, 30d supply, fill #0
  Filled 2021-08-12: qty 30, 30d supply, fill #1
  Filled 2021-10-07: qty 30, 30d supply, fill #2

## 2021-07-12 MED ORDER — PANTOPRAZOLE SODIUM 40 MG PO TBEC
40.0000 mg | DELAYED_RELEASE_TABLET | Freq: Every day | ORAL | 2 refills | Status: DC | PRN
  Filled 2021-07-12: qty 30, 30d supply, fill #0
  Filled 2021-08-12: qty 30, 30d supply, fill #1

## 2021-07-13 ENCOUNTER — Other Ambulatory Visit: Payer: Self-pay

## 2021-07-15 ENCOUNTER — Other Ambulatory Visit: Payer: Self-pay

## 2021-07-19 ENCOUNTER — Other Ambulatory Visit: Payer: Self-pay

## 2021-07-23 ENCOUNTER — Encounter: Payer: Self-pay | Admitting: Internal Medicine

## 2021-07-26 ENCOUNTER — Other Ambulatory Visit: Payer: Self-pay

## 2021-07-27 ENCOUNTER — Other Ambulatory Visit: Payer: Self-pay

## 2021-07-27 MED ORDER — ALBUTEROL SULFATE HFA 108 (90 BASE) MCG/ACT IN AERS
INHALATION_SPRAY | RESPIRATORY_TRACT | 5 refills | Status: DC
  Filled 2021-07-27: qty 18, 25d supply, fill #0
  Filled 2021-08-05: qty 18, 16d supply, fill #0
  Filled 2021-09-01: qty 18, 16d supply, fill #1
  Filled 2021-10-07: qty 18, 16d supply, fill #2
  Filled 2021-11-08: qty 18, 16d supply, fill #3
  Filled 2021-11-27: qty 18, 16d supply, fill #4
  Filled 2021-12-20: qty 18, 16d supply, fill #5

## 2021-08-03 ENCOUNTER — Other Ambulatory Visit: Payer: Self-pay

## 2021-08-03 ENCOUNTER — Other Ambulatory Visit: Payer: Managed Care, Other (non HMO) | Admitting: *Deleted

## 2021-08-03 DIAGNOSIS — I493 Ventricular premature depolarization: Secondary | ICD-10-CM

## 2021-08-03 DIAGNOSIS — Z01812 Encounter for preprocedural laboratory examination: Secondary | ICD-10-CM

## 2021-08-03 LAB — CBC
Hematocrit: 44.6 % (ref 37.5–51.0)
Hemoglobin: 15.1 g/dL (ref 13.0–17.7)
MCH: 31.1 pg (ref 26.6–33.0)
MCHC: 33.9 g/dL (ref 31.5–35.7)
MCV: 92 fL (ref 79–97)
Platelets: 355 10*3/uL (ref 150–450)
RBC: 4.86 x10E6/uL (ref 4.14–5.80)
RDW: 12 % (ref 11.6–15.4)
WBC: 9.3 10*3/uL (ref 3.4–10.8)

## 2021-08-03 LAB — BASIC METABOLIC PANEL
BUN/Creatinine Ratio: 11 (ref 10–24)
BUN: 13 mg/dL (ref 8–27)
CO2: 28 mmol/L (ref 20–29)
Calcium: 9.6 mg/dL (ref 8.6–10.2)
Chloride: 96 mmol/L (ref 96–106)
Creatinine, Ser: 1.21 mg/dL (ref 0.76–1.27)
Glucose: 117 mg/dL — ABNORMAL HIGH (ref 70–99)
Potassium: 4.1 mmol/L (ref 3.5–5.2)
Sodium: 139 mmol/L (ref 134–144)
eGFR: 67 mL/min/{1.73_m2} (ref >59)

## 2021-08-04 NOTE — Progress Notes (Signed)
?Cardiology Office Note:   ? ?Date:  08/05/2021  ? ?ID:  Joseph Harvey, DOB 1955/06/16, MRN [id number] ? ?PCP:  Ladell Pier, MD ?[place] Cardiologist: Donato Heinz, MD  ? ?Reason for visit: 63-monthfollow-up ? ?History of Present Illness:   ? ?Joseph Harvey a 66y.o. male with a hx of prostate cancer in remission, diabetes, hypertension, hyperlipidemia, sleep apnea on CPAP, tobacco use, deviated nasal septum causing nasal congestion, who was initially referred for dyspnea on exertion and chest pain in 2021.  Echo and cardiac MRI showed septal hypertrophy.  Normal Lexiscan. ? ?He last saw Dr. SGardiner RhymeDecember 2022.  Lower extremity edema had improved with weight loss.  He had persistent dyspnea with exertion.   ? ?Today, he states he is doing okay.  He has stable dyspnea on exertion.  He has recovered from back surgery; his activity now is mostly limited by his shortness of breath.  A neb treatments help his shortness of breath.  No exertional chest pain.  He is still smoking half pack per day.  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  He states that nicotine patches made him feel funny.     ? ?He states his lower extremity edema has resolved with HCTZ.  He denies presyncope, syncope and postural lightheadedness.   ? ?He asked about Ozempic for diabetes and weight loss.  He states he has been on metformin as needed since 2017 but thinks it makes him feel bloated.  He is scheduled for PVC ablation next week. ? ? ?Recent cardiac studies: ?-Echocardiogram on 03/18/2020 showed severe septal hypertrophy with otherwise mild concentric hypertrophy, mild intracavitary gradient (peak gradient 30 mmHg), normal biventricular function, grade 1 diastolic dysfunction, no significant valvular disease.  ? ?-Lexiscan Myoview on 04/29/2020 showed normal perfusion, EF 57%.   ? ?-Zio patch x3 days on 04/06/2021 showed frequent PVCs (14.3%  of beats), 1 episode of NSVT x4 beats ? ?-Calcium score 117 on 04/29/2020 (81st percentile).  ? ?-Cardiac MRI 06/23/2020 showed asymmetric hypertrophy measuring up to 20 mm in basal anterior septum (8 mm and posterior wall), consistent with hypertrophic cardiomyopathy.  Also with patchy LGE in basal septum and RV insertion sites consistent with HCM; LGE accounts for 11% of total myocardial mass.  Normal LV/RV size and systolic function.   ? ?-Echo on 04/14/2021 (while on HCTZ) showed EF of 60 to 65%, severe asymmetric hypertrophy of basal septum, no LVOT obstruction, grade 1 diastolic dysfunction, normal RV function, no significant valvular disease. ? ?  ?Past Medical History:  ?Diagnosis Date  ? Allergy   ? Dust, mold, dust mites  ? Anemia   ? Asthma   ? Cancer ([facility]   ? prostate  ? Cataract   ? bilateral repair.  ? COVID   ? Diabetes mellitus without complication ([place]   ? Glaucoma   ? Hyperlipidemia   ? Hypertension   ? [place]romuscular disorder ([place]   ? 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 Pneumonia   ? ? ?Past Surgical History:  ?Procedure Laterality Date  ? BACK SURGERY  2017  ? CATARACT EXTRACTION Bilateral   ? COLONOSCOPY    ? 2013  ? COLONOSCOPY  10/03/2019  ? KNEE ARTHROSCOPY  2005  ? right  ? PROSTATE SURGERY    ? ROBOT ASSISTED LAPAROSCOPIC RADICAL PROSTATECTOMY  03/2010  ? ? ?Current Medications: ?Current Meds  ?Medication  Sig  ? albuterol (PROVENTIL) (2.5 MG/3ML) 0.083% nebulizer solution Take 3 mLs (2.5 mg total) by nebulization every 4 (four) hours as needed for wheezing or shortness of breath (((PLAN B))).  ? albuterol (VENTOLIN HFA) 108 (90 Base) MCG/ACT inhaler Inhale 2 puffs into the lungs every 4 hours as needed for shortness of breath or wheezing  ? amLODipine (NORVASC) 5 MG tablet TAKE 1 TABLET (5 MG TOTAL) BY MOUTH 2 (TWO) TIMES DAILY.  ? ASPIRIN 81 PO Take 81 mg by mouth daily.  ? baclofen (LIORESAL) 10 MG tablet take 1 tablet by mouth 3 times a day  ? benzonatate (TESSALON) 100 MG capsule Take 1  capsule (100 mg total) by mouth every 8 (eight) hours.  ? Blood Glucose Monitoring Suppl (TRUE METRIX METER) w/Device KIT Use as directed  ? Budeson-Glycopyrrol-Formoterol (BREZTRI AEROSPHERE) 160-9-4.8 MCG/ACT AERO Inhale 2 puffs into the lungs in the morning and at bedtime.  ? budesonide-formoterol (SYMBICORT) 160-4.5 MCG/ACT inhaler Inhale 2 puffs into the lungs 2 (two) times daily.  ? buPROPion (WELLBUTRIN XL) 150 MG 24 hr tablet TAKE 1 TABLET (150 MG TOTAL) BY MOUTH DAILY.  ? busPIRone (BUSPAR) 10 MG tablet Take 1 tablet (10 mg total) by mouth 3 (three) times daily.  ? Cholecalciferol (VITAMIN D PO) Take 1 tablet by mouth daily.  ? doxycycline (VIBRA-TABS) 100 MG tablet Take 1 tablet (100 mg total) by mouth 2 (two) times daily.  ? ezetimibe (ZETIA) 10 MG tablet Take 1 tablet (10 mg total) by mouth daily.  ? FLOVENT HFA 220 MCG/ACT inhaler Inhale 1 puff into the lungs daily.  ? fluticasone (FLONASE) 50 MCG/ACT nasal spray PLACE 2 SPRAYS INTO BOTH NOSTRILS DAILY.  ? gabapentin (NEURONTIN) 300 MG capsule take 1 capsule by mouth in the morning, then take 1 at lunch and 2 at bedtime  ? glucose blood (TRUE METRIX BLOOD GLUCOSE TEST) test strip Use as instructed  ? hydrochlorothiazide (HYDRODIURIL) 25 MG tablet Take 1 tablet (25 mg total) by mouth daily.  ? HYDROcodone bit-homatropine (HYCODAN) 5-1.5 MG/5ML syrup Take 5 mLs by mouth every 6 (six) hours as needed for cough.  ? ipratropium-albuterol (DUONEB) 0.5-2.5 (3) MG/3ML SOLN USE 1 VIAL VIA NEBULIZER TWICE A DAY  ? linaclotide (LINZESS) 72 MCG capsule Take 1 capsule (72 mcg total) by mouth daily before breakfast.  ? metFORMIN (GLUCOPHAGE-XR) 500 MG 24 hr tablet TAKE 1 TABLET BY MOUTH DAILY.  ? montelukast (SINGULAIR) 10 MG tablet Take 1 tablet (10 mg total) by mouth at bedtime.  ? Nebivolol HCl 20 MG TABS Take 1 tablet (20 mg total) by mouth 2 (two) times daily.  ? pantoprazole (PROTONIX) 40 MG tablet Take 1 tablet (40 mg total) by mouth daily as needed.  ?  polyethylene glycol powder (GLYCOLAX/MIRALAX) 17 GM/SCOOP powder Take 17 g by mouth daily as needed.  ? potassium chloride SA (KLOR-CON) 20 MEQ tablet Take 20 mEq by mouth as needed. When hands cramp up  ? predniSONE (DELTASONE) 10 MG tablet   ? Respiratory Therapy Supplies (FLUTTER) DEVI Use three times a day after inhaler or nebulizer use  ? rosuvastatin (CRESTOR) 40 MG tablet Take 1 tablet (40 mg total) by mouth daily.  ? TRUEplus Lancets 28G MISC Use as directed  ? umeclidinium-vilanterol (ANORO ELLIPTA) 62.5-25 MCG/ACT AEPB Inhale 1 puff into the lungs daily at 6 (six) AM.  ? valsartan (DIOVAN) 320 MG tablet Take 1 tablet (320 mg total) by mouth daily.  ? VENTOLIN HFA 108 (90 Base) MCG/ACT inhaler  Inhale 2 puffs into the lungs every 4 (four) hours as needed for wheezing or shortness of breath.  ?  ? ?Allergies:   Amoxicillin, Lisinopril, Molds & smuts, Robitussin [guaifenesin], and Azithromycin  ? ?Social History  ? ?Socioeconomic History  ? Marital status: Single  ?  Spouse name: Not on file  ? Number of children: 2  ? Years of education: 12 grade  ? Highest education level: Not on file  ?Occupational History  ? Occupation: Recruitment consultant  ?  Employer: [employer]  ?Tobacco Use  ? Smoking status: Every Day  ?  Packs/day: 0.50  ?  Years: 46.00  ?  Pack years: 23.00  ?  Types: Cigarettes  ?  Start date: 30  ? Smokeless tobacco: Never  ? Tobacco comments:  ?  still smoking 0.5 ppd  ?Vaping Use  ? Vaping Use: Never used  ?Substance and Sexual Activity  ? Alcohol use: Not Currently  ?  Alcohol/week: 3.0 standard drinks  ?  Types: 3 Cans of beer per week  ?  Comment: rarely  ? Drug use: No  ? Sexual activity: Not Currently  ?Other Topics Concern  ? Not on file  ?Social History Narrative  ? Not on file  ? ?Social Determinants of Health  ? ?Financial Resource Strain: Not on file  ?Food Insecurity: Not on file  ?Transportation Needs: Not on file  ?Physical Activity: Not on file  ?Stress: Not on file  ?Social Connections:  Not on file  ?  ? ?Family History: ?The patient's family history includes Allergies in his brother; Asthma in his mother; Colon cancer in his father; Diabetes in his maternal grandmother; Lung cancer in his

## 2021-08-05 ENCOUNTER — Telehealth: Payer: Self-pay

## 2021-08-05 ENCOUNTER — Encounter: Payer: Self-pay | Admitting: Physician Assistant

## 2021-08-05 ENCOUNTER — Other Ambulatory Visit: Payer: Self-pay

## 2021-08-05 ENCOUNTER — Ambulatory Visit (INDEPENDENT_AMBULATORY_CARE_PROVIDER_SITE_OTHER): Payer: Managed Care, Other (non HMO) | Admitting: Physician Assistant

## 2021-08-05 VITALS — BP 118/82 | HR 93 | Ht 69.0 in | Wt 241.4 lb

## 2021-08-05 DIAGNOSIS — Z6835 Body mass index (BMI) 35.0-35.9, adult: Secondary | ICD-10-CM

## 2021-08-05 DIAGNOSIS — I422 Other hypertrophic cardiomyopathy: Secondary | ICD-10-CM | POA: Diagnosis not present

## 2021-08-05 DIAGNOSIS — I493 Ventricular premature depolarization: Secondary | ICD-10-CM

## 2021-08-05 DIAGNOSIS — Z720 Tobacco use: Secondary | ICD-10-CM | POA: Diagnosis not present

## 2021-08-05 DIAGNOSIS — E785 Hyperlipidemia, unspecified: Secondary | ICD-10-CM

## 2021-08-05 DIAGNOSIS — I10 Essential (primary) hypertension: Secondary | ICD-10-CM

## 2021-08-05 DIAGNOSIS — E119 Type 2 diabetes mellitus without complications: Secondary | ICD-10-CM

## 2021-08-05 DIAGNOSIS — G4733 Obstructive sleep apnea (adult) (pediatric): Secondary | ICD-10-CM | POA: Diagnosis not present

## 2021-08-05 DIAGNOSIS — E1169 Type 2 diabetes mellitus with other specified complication: Secondary | ICD-10-CM

## 2021-08-05 NOTE — Patient Instructions (Signed)
Medication Instructions:  ?No changes ?*If you need a refill on your cardiac medications before your next appointment, please call your pharmacy* ? ? ?Lab Work: ?None ?If you have labs (blood work) drawn today and your tests are completely normal, you will receive your results only by: ?MyChart Message (if you have MyChart) OR ?A paper copy in the mail ?If you have any lab test that is abnormal or we need to change your treatment, we will call you to review the results. ? ? ?Testing/Procedures: ?None ? ? ?Follow-Up: ?At [facility], you and your health needs are our priority.  As part of our continuing mission to provide you with exceptional heart care, we have created designated Provider Care Teams.  These Care Teams include your primary Cardiologist (physician) and Advanced Practice Providers (APPs -  Physician Assistants and Nurse Practitioners) who all work together to provide you with the care you need, when you need it. ? ?We recommend signing up for the patient portal called "MyChart".  Sign up information is provided on this After Visit Summary.  MyChart is used to connect with patients for Virtual Visits (Telemedicine).  Patients are able to view lab/test results, encounter notes, upcoming appointments, etc.  Non-urgent messages can be sent to your provider as well.   ?To learn more about what you can do with MyChart, go to [email or web address].   ? ?Your next appointment:   ?6 month(s) ? ?The format for your next appointment:   ?In Person ? ?Provider:   ?Donato Heinz, MD   ? ? ?  ?

## 2021-08-05 NOTE — Telephone Encounter (Signed)
Lmom pt to call back to r/s as they need to be seen by chris pavero only rph need to coordinate for when he is in the office.  ?

## 2021-08-06 ENCOUNTER — Other Ambulatory Visit: Payer: Self-pay

## 2021-08-09 NOTE — Pre-Procedure Instructions (Signed)
Instructed patient on the following items: Arrival time 0830 Nothing to eat or drink after midnight No meds AM of procedure Responsible person to drive you home and stay with you for 24 hrs    

## 2021-08-10 ENCOUNTER — Ambulatory Visit (HOSPITAL_COMMUNITY): Payer: Managed Care, Other (non HMO) | Admitting: Certified Registered Nurse Anesthetist

## 2021-08-10 ENCOUNTER — Other Ambulatory Visit: Payer: Self-pay

## 2021-08-10 ENCOUNTER — Ambulatory Visit (HOSPITAL_COMMUNITY)
Admission: RE | Admit: 2021-08-10 | Discharge: 2021-08-11 | Disposition: A | Discharge status: Home / Self Care | Payer: Managed Care, Other (non HMO) | Attending: Cardiology | Admitting: Cardiology

## 2021-08-10 ENCOUNTER — Ambulatory Visit (HOSPITAL_BASED_OUTPATIENT_CLINIC_OR_DEPARTMENT_OTHER): Payer: Managed Care, Other (non HMO) | Admitting: Certified Registered Nurse Anesthetist

## 2021-08-10 ENCOUNTER — Encounter (HOSPITAL_COMMUNITY): Payer: Self-pay | Admitting: Cardiology

## 2021-08-10 ENCOUNTER — Ambulatory Visit (HOSPITAL_COMMUNITY)
Admission: RE | Disposition: A | Discharge status: Home / Self Care | Payer: Managed Care, Other (non HMO) | Source: Home / Self Care | Attending: Cardiology

## 2021-08-10 DIAGNOSIS — F17210 Nicotine dependence, cigarettes, uncomplicated: Secondary | ICD-10-CM | POA: Diagnosis not present

## 2021-08-10 DIAGNOSIS — E119 Type 2 diabetes mellitus without complications: Secondary | ICD-10-CM | POA: Diagnosis not present

## 2021-08-10 DIAGNOSIS — Z79899 Other long term (current) drug therapy: Secondary | ICD-10-CM | POA: Insufficient documentation

## 2021-08-10 DIAGNOSIS — I422 Other hypertrophic cardiomyopathy: Secondary | ICD-10-CM | POA: Diagnosis not present

## 2021-08-10 DIAGNOSIS — Z7984 Long term (current) use of oral hypoglycemic drugs: Secondary | ICD-10-CM

## 2021-08-10 DIAGNOSIS — Z8546 Personal history of malignant neoplasm of prostate: Secondary | ICD-10-CM | POA: Diagnosis not present

## 2021-08-10 DIAGNOSIS — I10 Essential (primary) hypertension: Secondary | ICD-10-CM

## 2021-08-10 DIAGNOSIS — I493 Ventricular premature depolarization: Secondary | ICD-10-CM | POA: Insufficient documentation

## 2021-08-10 DIAGNOSIS — E785 Hyperlipidemia, unspecified: Secondary | ICD-10-CM | POA: Insufficient documentation

## 2021-08-10 HISTORY — PX: PVC ABLATION: EP1236

## 2021-08-10 LAB — GLUCOSE, CAPILLARY
Glucose-Capillary: 113 mg/dL — ABNORMAL HIGH (ref 70–99)
Glucose-Capillary: 87 mg/dL (ref 70–99)
Glucose-Capillary: 100 mg/dL — ABNORMAL HIGH (ref 70–99)
Glucose-Capillary: 142 mg/dL — ABNORMAL HIGH (ref 70–99)

## 2021-08-10 SURGERY — PVC ABLATION
Anesthesia: Monitor Anesthesia Care

## 2021-08-10 MED ORDER — FENTANYL CITRATE (PF) 100 MCG/2ML IJ SOLN
INTRAMUSCULAR | Status: DC | PRN
  Administered 2021-08-10: 25 ug via INTRAVENOUS

## 2021-08-10 MED ORDER — ONDANSETRON HCL 4 MG/2ML IJ SOLN: 4.0000 mg | Freq: Four times a day (QID) | INTRAMUSCULAR | Status: DC | PRN

## 2021-08-10 MED ORDER — UMECLIDINIUM-VILANTEROL 62.5-25 MCG/ACT IN AEPB: 1.0000 | INHALATION_SPRAY | Freq: Every day | RESPIRATORY_TRACT | Status: DC

## 2021-08-10 MED ORDER — LINACLOTIDE 72 MCG PO CAPS: 72.0000 ug | ORAL_CAPSULE | Freq: Every day | ORAL | Status: DC | PRN

## 2021-08-10 MED ORDER — MONTELUKAST SODIUM 10 MG PO TABS
10.0000 mg | ORAL_TABLET | Freq: Every day | ORAL | Status: DC
  Administered 2021-08-10: 10 mg via ORAL
  Filled 2021-08-10: qty 1

## 2021-08-10 MED ORDER — HYDROCHLOROTHIAZIDE 25 MG PO TABS
25.0000 mg | ORAL_TABLET | Freq: Every day | ORAL | Status: DC
  Administered 2021-08-10: 25 mg via ORAL
  Filled 2021-08-10: qty 1

## 2021-08-10 MED ORDER — SODIUM CHLORIDE 0.9 % IV SOLN: INTRAVENOUS | Status: DC

## 2021-08-10 MED ORDER — BACLOFEN 10 MG PO TABS: 10.0000 mg | ORAL_TABLET | Freq: Three times a day (TID) | ORAL | Status: DC | PRN

## 2021-08-10 MED ORDER — AMLODIPINE BESYLATE 5 MG PO TABS
5.0000 mg | ORAL_TABLET | Freq: Every day | ORAL | Status: DC
  Administered 2021-08-10: 5 mg via ORAL
  Filled 2021-08-10: qty 1

## 2021-08-10 MED ORDER — MOMETASONE FURO-FORMOTEROL FUM 200-5 MCG/ACT IN AERO: 2.0000 | INHALATION_SPRAY | Freq: Two times a day (BID) | RESPIRATORY_TRACT | Status: DC

## 2021-08-10 MED ORDER — HEPARIN SODIUM (PORCINE) 1000 UNIT/ML IJ SOLN
INTRAMUSCULAR | Status: DC | PRN
  Administered 2021-08-10: 1000 [IU] via INTRAVENOUS

## 2021-08-10 MED ORDER — ALBUTEROL SULFATE HFA 108 (90 BASE) MCG/ACT IN AERS: 2.0000 | INHALATION_SPRAY | RESPIRATORY_TRACT | Status: DC | PRN

## 2021-08-10 MED ORDER — NEBIVOLOL HCL 10 MG PO TABS
20.0000 mg | ORAL_TABLET | Freq: Two times a day (BID) | ORAL | Status: DC
  Administered 2021-08-10: 20 mg via ORAL
  Filled 2021-08-10 (×3): qty 2

## 2021-08-10 MED ORDER — POTASSIUM CHLORIDE CRYS ER 20 MEQ PO TBCR: 20.0000 meq | EXTENDED_RELEASE_TABLET | Freq: Every day | ORAL | Status: DC | PRN

## 2021-08-10 MED ORDER — ACETAMINOPHEN 325 MG PO TABS
650.0000 mg | ORAL_TABLET | ORAL | Status: DC | PRN
  Administered 2021-08-10: 650 mg via ORAL
  Filled 2021-08-10: qty 2

## 2021-08-10 MED ORDER — BENZONATATE 100 MG PO CAPS: 100.0000 mg | ORAL_CAPSULE | Freq: Two times a day (BID) | ORAL | Status: DC | PRN

## 2021-08-10 MED ORDER — ROSUVASTATIN CALCIUM 20 MG PO TABS
40.0000 mg | ORAL_TABLET | Freq: Every day | ORAL | Status: DC
  Administered 2021-08-10: 40 mg via ORAL
  Filled 2021-08-10: qty 2

## 2021-08-10 MED ORDER — BUDESON-GLYCOPYRROL-FORMOTEROL 160-9-4.8 MCG/ACT IN AERO: 2.0000 | INHALATION_SPRAY | Freq: Two times a day (BID) | RESPIRATORY_TRACT | Status: DC

## 2021-08-10 MED ORDER — BUSPIRONE HCL 10 MG PO TABS: 10.0000 mg | ORAL_TABLET | Freq: Three times a day (TID) | ORAL | Status: DC | PRN

## 2021-08-10 MED ORDER — BUPROPION HCL ER (XL) 150 MG PO TB24: 150.0000 mg | ORAL_TABLET | Freq: Every day | ORAL | Status: DC

## 2021-08-10 MED ORDER — HEPARIN (PORCINE) IN NACL 1000-0.9 UT/500ML-% IV SOLN
INTRAVENOUS | Status: DC | PRN
  Administered 2021-08-10: 500 mL

## 2021-08-10 MED ORDER — FLUTICASONE PROPIONATE 50 MCG/ACT NA SUSP: 2.0000 | Freq: Every day | NASAL | Status: DC

## 2021-08-10 MED ORDER — BUPIVACAINE HCL (PF) 0.25 % IJ SOLN
INTRAMUSCULAR | Status: DC | PRN
Start: 2021-08-10 — End: 2021-08-10
  Administered 2021-08-10: 60 mL

## 2021-08-10 MED ORDER — PHENYLEPHRINE HCL-NACL 20-0.9 MG/250ML-% IV SOLN
INTRAVENOUS | Status: DC | PRN
  Administered 2021-08-10: 20 ug/min via INTRAVENOUS

## 2021-08-10 MED ORDER — BUDESONIDE 0.5 MG/2ML IN SUSP: 0.5000 mg | Freq: Two times a day (BID) | RESPIRATORY_TRACT | Status: DC

## 2021-08-10 MED ORDER — ALBUTEROL SULFATE (2.5 MG/3ML) 0.083% IN NEBU
2.5000 mg | INHALATION_SOLUTION | RESPIRATORY_TRACT | Status: DC | PRN
  Administered 2021-08-10: 2.5 mg via RESPIRATORY_TRACT

## 2021-08-10 MED ORDER — UMECLIDINIUM BROMIDE 62.5 MCG/ACT IN AEPB: 1.0000 | INHALATION_SPRAY | Freq: Every day | RESPIRATORY_TRACT | Status: DC

## 2021-08-10 MED ORDER — EZETIMIBE 10 MG PO TABS
10.0000 mg | ORAL_TABLET | Freq: Every day | ORAL | Status: DC
  Administered 2021-08-10: 10 mg via ORAL
  Filled 2021-08-10: qty 1

## 2021-08-10 MED ORDER — HEPARIN SODIUM (PORCINE) 1000 UNIT/ML IJ SOLN
INTRAMUSCULAR | Status: AC
  Filled 2021-08-10: qty 10

## 2021-08-10 MED ORDER — METFORMIN HCL ER 500 MG PO TB24
500.0000 mg | ORAL_TABLET | Freq: Every day | ORAL | Status: DC
Start: 2021-08-11 — End: 2021-08-11
  Filled 2021-08-10: qty 1

## 2021-08-10 MED ORDER — HEPARIN (PORCINE) IN NACL 1000-0.9 UT/500ML-% IV SOLN
INTRAVENOUS | Status: AC
  Filled 2021-08-10: qty 500

## 2021-08-10 MED ORDER — PREDNISONE 10 MG PO TABS: 30.0000 mg | ORAL_TABLET | Freq: Every day | ORAL | Status: DC | PRN

## 2021-08-10 MED ORDER — HYDROCODONE BIT-HOMATROP MBR 5-1.5 MG/5ML PO SOLN: 5.0000 mL | Freq: Four times a day (QID) | ORAL | Status: DC | PRN

## 2021-08-10 MED ORDER — SODIUM CHLORIDE 0.9 % IV SOLN: 250.0000 mL | INTRAVENOUS | Status: DC | PRN

## 2021-08-10 MED ORDER — MIDAZOLAM HCL 2 MG/2ML IJ SOLN
INTRAMUSCULAR | Status: DC | PRN
Start: 2021-08-10 — End: 2021-08-10
  Administered 2021-08-10: 2 mg via INTRAVENOUS

## 2021-08-10 MED ORDER — IRBESARTAN 150 MG PO TABS
300.0000 mg | ORAL_TABLET | Freq: Every day | ORAL | Status: DC
  Administered 2021-08-10: 300 mg via ORAL
  Filled 2021-08-10: qty 1

## 2021-08-10 MED ORDER — GABAPENTIN 300 MG PO CAPS: 300.0000 mg | ORAL_CAPSULE | Freq: Three times a day (TID) | ORAL | Status: DC | PRN

## 2021-08-10 MED ORDER — BUPIVACAINE HCL (PF) 0.25 % IJ SOLN
INTRAMUSCULAR | Status: AC
  Filled 2021-08-10: qty 60

## 2021-08-10 MED ORDER — PROPOFOL 10 MG/ML IV BOLUS
INTRAVENOUS | Status: DC | PRN
Start: 2021-08-10 — End: 2021-08-10
  Administered 2021-08-10 (×4): 20 mg via INTRAVENOUS

## 2021-08-10 MED ORDER — ALBUTEROL SULFATE (2.5 MG/3ML) 0.083% IN NEBU
INHALATION_SOLUTION | RESPIRATORY_TRACT | Status: AC
  Filled 2021-08-10: qty 3

## 2021-08-10 MED ORDER — PROPOFOL 500 MG/50ML IV EMUL
INTRAVENOUS | Status: DC | PRN
  Administered 2021-08-10: 50 ug/kg/min via INTRAVENOUS

## 2021-08-10 MED ORDER — SODIUM CHLORIDE 0.9% FLUSH: 3.0000 mL | INTRAVENOUS | Status: DC | PRN

## 2021-08-10 MED ORDER — SODIUM CHLORIDE 0.9% FLUSH
3.0000 mL | Freq: Two times a day (BID) | INTRAVENOUS | Status: DC
  Administered 2021-08-10: 3 mL via INTRAVENOUS

## 2021-08-10 MED ORDER — IPRATROPIUM-ALBUTEROL 0.5-2.5 (3) MG/3ML IN SOLN: 3.0000 mL | Freq: Every day | RESPIRATORY_TRACT | Status: DC | PRN

## 2021-08-10 MED ORDER — POLYETHYLENE GLYCOL 3350 17 GM/SCOOP PO POWD: 17.0000 g | Freq: Every day | ORAL | Status: DC | PRN

## 2021-08-10 SURGICAL SUPPLY — 15 items
BAG SNAP BAND KOVER 36X36 (MISCELLANEOUS) ×1 IMPLANT
CATH JOSEPH QUAD ALLRED 6F REP (CATHETERS) ×1 IMPLANT
CATH SMTCH THERMOCOOL SF DF (CATHETERS) ×1 IMPLANT
CATH SOUNDSTAR ECO 8FR (CATHETERS) ×1 IMPLANT
CLOSURE PERCLOSE PROSTYLE (VASCULAR PRODUCTS) ×3 IMPLANT
MAT PREVALON FULL STRYKER (MISCELLANEOUS) ×1 IMPLANT
PACK EP LATEX FREE (CUSTOM PROCEDURE TRAY) ×2
PACK EP LF (CUSTOM PROCEDURE TRAY) ×1 IMPLANT
PAD DEFIB RADIO PHYSIO CONN (PAD) ×2 IMPLANT
PATCH CARTO3 (PAD) ×1 IMPLANT
SHEATH PINNACLE 7F 10CM (SHEATH) ×1 IMPLANT
SHEATH PINNACLE 8F 10CM (SHEATH) ×1 IMPLANT
SHEATH PINNACLE 9F 10CM (SHEATH) ×1 IMPLANT
SHEATH PROBE COVER 6X72 (BAG) ×1 IMPLANT
TUBING SMART ABLATE COOLFLOW (TUBING) ×1 IMPLANT

## 2021-08-10 NOTE — H&P (Signed)
? ?Electrophysiology Office Note ? ? ?Date:  08/10/2021  ? ?ID:  Joseph Harvey, DOB April 11, 1956, MRN [id number] ? ?PCP:  Joseph Pier, MD  ?Cardiologist:  Gardiner Rhyme ?Primary Electrophysiologist:  Joseph Zappulla Meredith Leeds, MD   ? ?Chief Complaint: PVC ?  ?History of Present Illness: ?Joseph Harvey is a 66 y.o. male who is being seen today for the evaluation of PVC at the request of No ref. provider found. Presenting today for electrophysiology evaluation. ? ?He has a history significant prostate cancer in remission, type 2 diabetes, hypertension, hyperlipidemia, tobacco abuse.  He was initially seen 04/14/2020 in the hospital with chest pain on minimal exertion.  Echo at that time showed severe septal hypertrophy with otherwise mild concentric LVH and a mild intracavitary gradient.  He had a Myoview with an ejection fraction of 57%.  Cardiac MRI showed symmetric hypertrophy measuring 20 mm in the basal anterior septum consistent with hypertrophic cardiomyopathy.  He had 11% LGE. ? ?Today, denies symptoms of palpitations, chest pain, shortness of breath, orthopnea, PND, lower extremity edema, claudication, dizziness, presyncope, syncope, bleeding, or neurologic sequela. The patient is tolerating medications without difficulties. Plan for PVC ablation today.  ? ? ?Past Medical History:  ?Diagnosis Date  ? Allergy   ? Dust, mold, dust mites  ? Anemia   ? Asthma   ? Cancer [facility])   ? prostate  ? Cataract   ? bilateral repair.  ? COVID   ? Diabetes mellitus without complication ([place])   ? Glaucoma   ? Hyperlipidemia   ? Hypertension   ? Neuromuscular disorder ([place])   ? 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 Pneumonia   ? ?Past Surgical History:  ?Procedure Laterality Date  ? BACK SURGERY  2017  ? CATARACT EXTRACTION Bilateral   ? COLONOSCOPY    ? 2013  ? COLONOSCOPY  10/03/2019  ? KNEE ARTHROSCOPY  2005  ? right  ? PROSTATE SURGERY    ? ROBOT ASSISTED LAPAROSCOPIC RADICAL PROSTATECTOMY  03/2010  ? ? ? ?Current Facility-Administered  Medications  ?Medication Dose Route Frequency Provider Last Rate Last Admin  ? 0.9 %  sodium chloride infusion   Intravenous Continuous Earlie Arciga, Ocie Doyne, MD      ? ? ?Allergies:   Amoxicillin, Lisinopril, Molds & smuts, Robitussin [guaifenesin], and Azithromycin  ? ?Social History:  The patient  reports that he has been smoking cigarettes. He started smoking about 47 years ago. He has a 23.00 pack-year smoking history. He has never used smokeless tobacco. He reports that he does not currently use alcohol after a past usage of about 3.0 standard drinks per week. He reports that he does not use drugs.  ? ?Family History:  The patient's family history includes Allergies in his brother; Asthma in his mother; Colon cancer in his father; Diabetes in his maternal grandmother; Lung cancer in his father and mother; Prostate cancer in his father.  ? ?ROS:  Please see the history of present illness.   Otherwise, review of systems is positive for none.   All other systems are reviewed and negative.  ? ?PHYSICAL EXAM: ?VS:  BP 128/83   Pulse 93   Temp 98.5 ?F (36.9 ?C) (Oral)   Resp 18   Ht '5\' 9"'$  (1.753 m)   Wt 108 kg   SpO2 96%   BMI 35.15 kg/m?  , BMI Body mass index is 35.15 kg/m?. ?GEN: Well nourished, well developed, in no acute distress  ?HEENT: normal  ?Neck: no JVD, carotid bruits,  or masses ?Cardiac: RRR; no murmurs, rubs, or gallops,no edema  ?Respiratory:  clear to auscultation bilaterally, normal work of breathing ?GI: soft, nontender, nondistended, + BS ?MS: no deformity or atrophy  ?Skin: warm and dry ?Neuro:  Strength and sensation are intact ?Psych: euthymic mood, full affect ? ?Recent Labs: ?03/25/2021: ALT 17 ?04/14/2021: BNP 43.7; Magnesium 2.0 ?06/15/2021: TSH 0.718 ?08/03/2021: BUN 13; Creatinine, Ser 1.21; Hemoglobin 15.1; Platelets 355; Potassium 4.1; Sodium 139  ? ? ?Lipid Panel  ?   ?Component Value Date/Time  ? CHOL 184 03/25/2021 0926  ? TRIG 472 (H) 03/25/2021 0926  ? HDL 61 03/25/2021 0926   ? CHOLHDL 3.0 03/25/2021 0926  ? [place] 53 03/25/2021 0926  ? ? ? ?Wt Readings from Last 3 Encounters:  ?08/10/21 108 kg  ?08/05/21 109.5 kg  ?05/26/21 110.9 kg  ?  ? ? ?Other studies Reviewed: ?Additional studies/ records that were reviewed today include: TTE 04/14/21  ?Review of the above records today demonstrates:  ? 1. Left ventricular ejection fraction, by estimation, is 60 to 65%. The  ?left ventricle has normal function. The left ventricle has no regional  ?wall motion abnormalities. There is severe asymmetric left ventricular  ?hypertrophy of the basal-septal  ?segment. No LVOT obstruction. Left ventricular diastolic parameters are  ?consistent with Grade I diastolic dysfunction (impaired relaxation).  ? 2. Right ventricular systolic function is normal. The right ventricular  ?size is normal. Tricuspid regurgitation signal is inadequate for assessing  ?PA pressure.  ? 3. The mitral valve is normal in structure. Trivial mitral valve  ?regurgitation.  ? 4. The aortic valve is tricuspid. Aortic valve regurgitation is not  ?visualized. No aortic stenosis is present.  ? 5. The inferior vena cava is normal in size with greater than 50%  ?respiratory variability, suggesting right atrial pressure of 3 mmHg.  ? ?Monitor 04/25/21 - personally reviewed ?Patient had a min HR of 66 bpm, max HR of 158 bpm, and avg HR of 91 bpm. Predominant underlying rhythm was Sinus Rhythm.  Isolated SVEs were rare (<1.0%) ?Isolated VEs were frequent (14.3%, 54198), VE Couplets were rare (<1.0%,  ?286), and no VE Triplets were present. Ventricular Bigeminy and Trigeminy were present.  No patient triggered events ? ?CMRI 06/23/20 ?1. Asymmetric hypertrophy measuring up to 98m in basal anteroseptum ?(829min posterior wall), consistent with hypertrophic cardiomyopathy ?  ?2. Patchy LGE in basal septum and RV insertion sites, consistent ?with HCM. LGE accounts for 11% of total myocardial mass ?  ?3. Normal LV size and systolic function (EF  6376%?  ?4. Normal RV size and systolic function (EF 6373%?  ?ASSESSMENT AND PLAN: ? ?1.  Hypertrophic cardiomyopathy: Noted on cardiac MRI 06/23/2020.  LGE of 11% of his myocardial mass with a 20 mm septum.  Currently on nebivolol 20 mg twice daily.  He Jaiveon Suppes potentially need an ICD.  Prior to that, we Yoshiko Keleher plan to treat his PVCs.  We Taina Landry discuss ICD therapy at the next visit. ? ?2.  PVCs: JeKayman Snufferas presented today for surgery, with the diagnosis of PVC.  The various methods of treatment have been discussed with the patient and family. After consideration of risks, benefits and other options for treatment, the patient has consented to  Procedure(s): ?Catheter ablation as a surgical intervention .  Risks include but not limited to complete heart block, stroke, esophageal damage, nerve damage, bleeding, vascular damage, tamponade, perforation, MI, and death. The patient's history has been reviewed, patient examined, no  change in status, stable for surgery.  I have reviewed the patient's chart and labs.  Questions were answered to the patient's satisfaction.   ? ?Allegra Lai, MD ?08/10/2021 ?7:47 AM  ? ?

## 2021-08-10 NOTE — Discharge Instructions (Signed)

## 2021-08-10 NOTE — Anesthesia Procedure Notes (Signed)
Procedure Name: [place] ?Date/Time: 08/10/2021 8:34 AM ?Performed by: Dorthea Cove, CRNA ?Pre-anesthesia Checklist: Patient identified, Emergency Drugs available, Suction available, Patient being monitored and Timeout performed ?Patient Re-evaluated:Patient Re-evaluated prior to induction ?Oxygen Delivery Method: Simple face mask ?Preoxygenation: Pre-oxygenation with 100% oxygen ?Induction Type: IV induction ?Placement Confirmation: positive ETCO2 and CO2 detector ? ? ? ? ?

## 2021-08-10 NOTE — Anesthesia Postprocedure Evaluation (Signed)
Anesthesia Post Note ? ?Patient: Joseph Harvey ? ?Procedure(s) Performed: PVC ABLATION ? ?  ? ?Patient location during evaluation: PACU ?Anesthesia Type: MAC ?Level of consciousness: awake and alert ?Pain management: pain level controlled ?Vital Signs Assessment: post-procedure vital signs reviewed and stable ?Respiratory status: spontaneous breathing, nonlabored ventilation, respiratory function stable and patient connected to nasal cannula oxygen ?Cardiovascular status: stable and blood pressure returned to baseline ?Postop Assessment: no apparent nausea or vomiting ?Anesthetic complications: no ? ? ?There were no known notable events for this encounter. ? ?Last Vitals:  ?Vitals:  ? 08/10/21 1220 08/10/21 1259  ?BP: (!) 143/73 123/63  ?Pulse: 88   ?Resp: (!) 23   ?Temp:    ?SpO2: 97%   ?  ?Last Pain:  ?Vitals:  ? 08/10/21 1220  ?TempSrc:   ?PainSc: 0-No pain  ? ? ?  ?  ?  ?  ?  ?  ? ?Effie Berkshire ? ? ? ? ?

## 2021-08-10 NOTE — Discharge Summary (Addendum)
? ? ?ELECTROPHYSIOLOGY PROCEDURE DISCHARGE SUMMARY  ? ? ?Patient ID: Joseph Harvey,  ?MRN: [id number], DOB/AGE: 65-25-1957 65 y.o. ? ?Admit date: 08/10/2021 ?Discharge date: 08/11/21  ? ?Primary Care Physician: Ladell Pier, MD  ?Primary Cardiologist: Donato Heinz, MD  ?Electrophysiologist: Dr. Curt Bears ? ?Primary Discharge Diagnosis:  ?PVCs ? ?Secondary Discharge Diagnosis:  ?Hypertrophic cardiomyopathy ? ?Procedures This Admission:  ?1.  Electrophysiology study and radiofrequency catheter ablation of  PVCs  on 08/10/2021 by Dr. Curt Bears.  This study demonstrated: ?i. Sinus rhythm upon presentation.  ?ii. The patient had PVCs ?iii. Successful radiofrequency modification of the PVCs ?iv. No inducible arrhythmias following ablation.  ?v. No early apparent complications.  ? ?Brief HPI: ?Joseph Harvey is a 66 y.o. male with a history of PVCs.  They have failed medical therapy with BB. Risks, benefits, and alternatives to catheter ablation of  PVCs  were reviewed with the patient who wished to proceed.   ? ?Hospital Course:  ?The patient was admitted and underwent EPS/RFCA of  PVCs  with details as outlined above.  They were monitored on telemetry overnight which demonstrated stable rhythm without PVCs.  Groin was without complication on the day of discharge.  The patient was examined and considered to be stable for discharge.  Wound care and restrictions were reviewed with the patient.  The patient Joseph Harvey be seen back by  Dr. Curt Bears  in 4 weeks for post ablation follow up.  ? ?Physical Exam: ?Vitals:  ? 08/10/21 1746 08/10/21 1914 08/11/21 0353 08/11/21 0742  ?BP: 111/78 124/73 136/87 122/77  ?Pulse: 88 87 74 79  ?Resp:  _0 ?Temp: 98.1 ?F (36.7 ?C) 98.3 ?F (36.8 ?C) 98 ?F (36.7 ?C) 98.3 ?F (36.8 ?C)  ?TempSrc: Oral Oral Oral Oral  ?SpO2: 99% 99% 98% 98%  ?Weight:      ?Height:      ? ? ?GEN- The patient is well appearing, alert and oriented x 3 today.   ?HEENT: normocephalic, atraumatic; sclera clear,  conjunctiva pink; hearing intact; oropharynx clear; neck supple  ?Lungs- Clear to ausculation bilaterally, normal work of breathing.  No wheezes, rales, rhonchi ?Heart- Regular rate and rhythm, no murmurs, rubs or gallops  ?GI- soft, non-tender, non-distended, bowel sounds present  ?Extremities- no clubbing, cyanosis, or edema; DP/PT/radial pulses 2+ bilaterally, groin without hematoma/bruit ?MS- no significant deformity or atrophy ?Skin- warm and dry, no rash or lesion ?Psych- euthymic mood, full affect ?Neuro- strength and sensation are intact ? ? ?Labs: ?  ?Lab Results  ?Component Value Date  ? WBC 9.3 08/03/2021  ? HGB 15.1 08/03/2021  ? HCT 44.6 08/03/2021  ? MCV 92 08/03/2021  ? PLT 355 08/03/2021  ? No results for input(s): NA, K, CL, CO2, BUN, CREATININE, CALCIUM, PROT, BILITOT, ALKPHOS, ALT, AST, GLUCOSE in the last 168 hours. ? ?Invalid input(s): LABALBU ? ? ?Discharge Medications:  ?Allergies as of 08/11/2021   ? ?   Reactions  ? Amoxicillin Anaphylaxis  ? Has patient had a PCN reaction causing immediate rash, facial/tongue/throat swelling, SOB or lightheadedness with hypotension: Yes ?Has patient had a PCN reaction causing severe rash involving mucus membranes or skin necrosis: No ?Has patient had a PCN reaction that required hospitalization: Yes ?Has patient had a PCN reaction occurring within the last 10 years: Yes ?If all of the above answers are "NO", then may proceed with Cephalosporin use.  ? Lisinopril Shortness Of Breath  ? Molds & Smuts Anaphylaxis  ? Robitussin [guaifenesin] Shortness Of Breath  ?  wheezing  ? Azithromycin Other (See Comments)  ? ?  ? ?  ?Medication List  ?  ? ?TAKE these medications   ? ?acetaminophen 325 MG tablet ?Commonly known as: TYLENOL ?Take 2 tablets (650 mg total) by mouth every 4 (four) hours as needed for headache or mild pain. ?  ?amLODipine 5 MG tablet ?Commonly known as: NORVASC ?TAKE 1 TABLET (5 MG TOTAL) BY MOUTH 2 (TWO) TIMES DAILY. ?  ?ASPIRIN 81 PO ?Take 81 mg  by mouth daily. ?  ?baclofen 10 MG tablet ?Commonly known as: LIORESAL ?take 1 tablet by mouth 3 times a day ?What changed:  ?how much to take ?how to take this ?when to take this ?reasons to take this ?  ?benzonatate 100 MG capsule ?Commonly known as: TESSALON ?Take 1 capsule (100 mg total) by mouth every 8 (eight) hours. ?What changed:  ?when to take this ?reasons to take this ?  ?Breztri Aerosphere 160-9-4.8 MCG/ACT Aero ?Generic drug: Budeson-Glycopyrrol-Formoterol ?Inhale 2 puffs into the lungs in the morning and at bedtime. ?  ?budesonide-formoterol 160-4.5 MCG/ACT inhaler ?Commonly known as: SYMBICORT ?Inhale 2 puffs into the lungs 2 (two) times daily. ?  ?buPROPion 150 MG 24 hr tablet ?Commonly known as: WELLBUTRIN XL ?TAKE 1 TABLET (150 MG TOTAL) BY MOUTH DAILY. ?  ?busPIRone 10 MG tablet ?Commonly known as: BUSPAR ?Take 1 tablet (10 mg total) by mouth 3 (three) times daily. ?What changed:  ?when to take this ?reasons to take this ?  ?ezetimibe 10 MG tablet ?Commonly known as: ZETIA ?Take 1 tablet (10 mg total) by mouth daily. ?  ?Flovent HFA 220 MCG/ACT inhaler ?Generic drug: fluticasone ?Inhale 1 puff into the lungs daily. ?  ?fluticasone 50 MCG/ACT nasal spray ?Commonly known as: FLONASE ?PLACE 2 SPRAYS INTO BOTH NOSTRILS DAILY. ?  ?Flutter Devi ?Use three times a day after inhaler or nebulizer use ?  ?gabapentin 300 MG capsule ?Commonly known as: NEURONTIN ?take 1 capsule by mouth in the morning, then take 1 at lunch and 2 at bedtime ?What changed:  ?how much to take ?how to take this ?when to take this ?reasons to take this ?  ?hydrochlorothiazide 25 MG tablet ?Commonly known as: HYDRODIURIL ?Take 1 tablet (25 mg total) by mouth daily. ?  ?HYDROcodone bit-homatropine 5-1.5 MG/5ML syrup ?Commonly known as: HYCODAN ?Take 5 mLs by mouth every 6 (six) hours as needed for cough. ?  ?ipratropium-albuterol 0.5-2.5 (3) MG/3ML Soln ?Commonly known as: DUONEB ?USE 1 VIAL VIA NEBULIZER TWICE A DAY ?What changed:   ?how much to take ?when to take this ?reasons to take this ?additional instructions ?  ?linaclotide 72 MCG capsule ?Commonly known as: Linzess ?Take 1 capsule (72 mcg total) by mouth daily before breakfast. ?What changed:  ?when to take this ?reasons to take this ?  ?metFORMIN 500 MG 24 hr tablet ?Commonly known as: GLUCOPHAGE-XR ?TAKE 1 TABLET BY MOUTH DAILY. ?  ?montelukast 10 MG tablet ?Commonly known as: SINGULAIR ?Take 1 tablet (10 mg total) by mouth at bedtime. ?  ?Nebivolol HCl 20 MG Tabs ?Take 1 tablet (20 mg total) by mouth 2 (two) times daily. ?  ?pantoprazole 40 MG tablet ?Commonly known as: PROTONIX ?Take 1 tablet (40 mg total) by mouth daily as needed. ?  ?polyethylene glycol powder 17 GM/SCOOP powder ?Commonly known as: GLYCOLAX/MIRALAX ?Take 17 g by mouth daily as needed. ?What changed: reasons to take this ?  ?potassium chloride SA 20 MEQ tablet ?Commonly known as: KLOR-CON M ?Take 20  mEq by mouth daily as needed (When hands cramp up). ?  ?predniSONE 10 MG tablet ?Commonly known as: DELTASONE ?Take 30 mg by mouth daily as needed (Shortness of breath). ?  ?rosuvastatin 40 MG tablet ?Commonly known as: CRESTOR ?Take 1 tablet (40 mg total) by mouth daily. ?  ?True Metrix Blood Glucose Test test strip ?Generic drug: glucose blood ?Use as instructed ?  ?True Metrix Meter w/Device Kit ?Use as directed ?  ?TRUEplus Lancets 28G Misc ?Use as directed ?  ?umeclidinium-vilanterol 62.5-25 MCG/ACT Aepb ?Commonly known as: ANORO ELLIPTA ?Inhale 1 puff into the lungs daily at 6 (six) AM. ?  ?valsartan 320 MG tablet ?Commonly known as: DIOVAN ?Take 1 tablet (320 mg total) by mouth daily. ?  ?Ventolin HFA 108 (90 Base) MCG/ACT inhaler ?Generic drug: albuterol ?Inhale 2 puffs into the lungs every 4 (four) hours as needed for wheezing or shortness of breath. ?  ?albuterol 108 (90 Base) MCG/ACT inhaler ?Commonly known as: VENTOLIN HFA ?Inhale 2 puffs into the lungs every 4 hours as needed for shortness of breath or  wheezing ?  ?albuterol (2.5 MG/3ML) 0.083% nebulizer solution ?Commonly known as: PROVENTIL ?Take 3 mLs (2.5 mg total) by nebulization every 4 (four) hours as needed for wheezing or shortness of breath ((

## 2021-08-10 NOTE — Transfer of Care (Signed)
Immediate Anesthesia Transfer of Care Note ? ?Patient: Joseph Harvey ? ?Procedure(s) Performed: PVC ABLATION ? ?Patient Location: Cath Lab ? ?Anesthesia Type:MAC ? ?Level of Consciousness: awake, alert  and oriented ? ?Airway & Oxygen Therapy: Patient Spontanous Breathing ? ?Post-op Assessment: Report given to RN and Post -op Vital signs reviewed and stable ? ?Post vital signs: Reviewed and stable ? ?Last Vitals:  ?Vitals Value Taken Time  ?BP 114/67 08/10/21 1015  ?Temp    ?Pulse 81 08/10/21 1017  ?Resp 21 08/10/21 1017  ?SpO2 99 % 08/10/21 1017  ?Vitals shown include unvalidated device data. ? ?Last Pain:  ?Vitals:  ? 08/10/21 0837  ?TempSrc:   ?PainSc: 0-No pain  ?   ? ?  ? ?Complications: There were no known notable events for this encounter. ?

## 2021-08-10 NOTE — Anesthesia Preprocedure Evaluation (Signed)
Anesthesia Evaluation  ?Patient identified by MRN, date of birth, ID band ?Patient awake ? ? ? ?Reviewed: ?Allergy & Precautions, NPO status , Patient's Chart, lab work & pertinent test results ? ?Airway ?Mallampati: II ? ?TM Distance: >3 FB ?Neck ROM: Full ? ? ? Dental ? ?(+) Upper Dentures, Lower Dentures ?  ?Pulmonary ?asthma , Current Smoker,  ?  ?breath sounds clear to auscultation ? ? ? ? ? ? Cardiovascular ?hypertension, Pt. on medications ? ?Rhythm:Regular Rate:Normal ? ? ?  ?Neuro/Psych ?PSYCHIATRIC DISORDERS Anxiety  Neuromuscular disease   ? GI/Hepatic ?Neg liver ROS, GERD  ,  ?Endo/Other  ?diabetes, Type 2, Oral Hypoglycemic Agents ? Renal/GU ?Renal disease  ? ?  ?Musculoskeletal ?negative musculoskeletal ROS ?(+)  ? Abdominal ?(+) + obese,   ?Peds ? Hematology ?negative hematology ROS ?(+)   ?Anesthesia Other Findings ? ? Reproductive/Obstetrics ? ?  ? ? ? ? ? ? ? ? ? ? ? ? ? ?  ?  ? ? ? ? ? ? ? ? ?Anesthesia Physical ?Anesthesia Plan ? ?ASA: 3 ? ?Anesthesia Plan: MAC  ? ?Post-op Pain Management:   ? ?Induction: Intravenous ? ?PONV Risk Score and Plan: 0 and Propofol infusion ? ?Airway Management Planned: Natural Airway and Simple Face Mask ? ?Additional Equipment: None ? ?Intra-op Plan:  ? ?Post-operative Plan:  ? ?Informed Consent: I have reviewed the patients History and Physical, chart, labs and discussed the procedure including the risks, benefits and alternatives for the proposed anesthesia with the patient or authorized representative who has indicated his/her understanding and acceptance.  ? ? ? ? ? ?Plan Discussed with: CRNA ? ?Anesthesia Plan Comments:   ? ? ? ? ? ? ?Anesthesia Quick Evaluation ? ?

## 2021-08-10 NOTE — Plan of Care (Signed)

## 2021-08-11 ENCOUNTER — Encounter (HOSPITAL_COMMUNITY): Payer: Self-pay | Admitting: Cardiology

## 2021-08-11 ENCOUNTER — Encounter: Payer: Self-pay | Admitting: Cardiology

## 2021-08-11 DIAGNOSIS — I493 Ventricular premature depolarization: Secondary | ICD-10-CM

## 2021-08-11 LAB — GLUCOSE, CAPILLARY: Glucose-Capillary: 123 mg/dL — ABNORMAL HIGH (ref 70–99)

## 2021-08-11 MED ORDER — ACETAMINOPHEN 325 MG PO TABS: 650.0000 mg | ORAL_TABLET | ORAL | Status: DC | PRN

## 2021-08-11 NOTE — Progress Notes (Signed)
Pt refusing to take medications, pt questioned if he had brought medications from home. Pt stated, "I wouldn't tell you if I did". Pt educated on drug-to-drug interactions. Will continue to monitor.  ? ?Elaina Hoops, RN ? ?

## 2021-08-12 ENCOUNTER — Other Ambulatory Visit: Payer: Self-pay

## 2021-08-12 ENCOUNTER — Other Ambulatory Visit: Payer: Self-pay | Admitting: Internal Medicine

## 2021-08-12 DIAGNOSIS — J4550 Severe persistent asthma, uncomplicated: Secondary | ICD-10-CM

## 2021-08-12 MED ORDER — ALBUTEROL SULFATE (2.5 MG/3ML) 0.083% IN NEBU
2.5000 mg | INHALATION_SOLUTION | RESPIRATORY_TRACT | 0 refills | Status: DC | PRN
  Filled 2021-08-12 – 2021-08-13 (×3): qty 180, 10d supply, fill #0
  Filled 2021-08-20: qty 150, 9d supply, fill #0

## 2021-08-12 MED FILL — Albuterol Sulfate Soln Nebu 0.083% (2.5 MG/3ML): RESPIRATORY_TRACT | Qty: 3 | Status: AC

## 2021-08-13 ENCOUNTER — Other Ambulatory Visit: Payer: Self-pay

## 2021-08-15 ENCOUNTER — Other Ambulatory Visit: Payer: Self-pay | Admitting: Physician Assistant

## 2021-08-16 ENCOUNTER — Other Ambulatory Visit: Payer: Self-pay

## 2021-08-19 ENCOUNTER — Ambulatory Visit: Payer: Managed Care, Other (non HMO) | Admitting: Cardiology

## 2021-08-23 ENCOUNTER — Ambulatory Visit: Payer: Managed Care, Other (non HMO)

## 2021-08-23 ENCOUNTER — Other Ambulatory Visit: Payer: Self-pay

## 2021-08-24 ENCOUNTER — Encounter: Payer: Self-pay | Admitting: Pharmacist

## 2021-08-24 ENCOUNTER — Ambulatory Visit: Payer: Managed Care, Other (non HMO) | Admitting: Pharmacist

## 2021-08-24 ENCOUNTER — Other Ambulatory Visit: Payer: Self-pay | Admitting: Physician Assistant

## 2021-08-24 VITALS — BP 118/75 | HR 84 | Wt 245.0 lb

## 2021-08-24 DIAGNOSIS — E1169 Type 2 diabetes mellitus with other specified complication: Secondary | ICD-10-CM

## 2021-08-24 DIAGNOSIS — E785 Hyperlipidemia, unspecified: Secondary | ICD-10-CM | POA: Diagnosis not present

## 2021-08-24 DIAGNOSIS — E119 Type 2 diabetes mellitus without complications: Secondary | ICD-10-CM

## 2021-08-24 MED ORDER — TRULICITY 0.75 MG/0.5ML ~~LOC~~ SOAJ: SUBCUTANEOUS | 1 refills | Status: DC

## 2021-08-24 MED ORDER — OZEMPIC (0.25 OR 0.5 MG/DOSE) 2 MG/3ML ~~LOC~~ SOPN: PEN_INJECTOR | SUBCUTANEOUS | 1 refills | Status: DC

## 2021-08-24 NOTE — Patient Instructions (Signed)
It was nice meeting you today ? ?We would like your A1c to be less than 7.0% ? ?We will start a new medication called Ozempic.  Your dose will be 0.'25mg'$  once a week for 4 weeks and then increase to 0.'5mg'$  on week number 5 ? ?When you need a refill, please send a myChart message ? ?Please call with any questions ? ?Karren Cobble, PharmD, BCACP, [place], CPP ?[address], [address] ?[place], [place], [address] ?Phone: [phone], Fax: [phone]  ?

## 2021-08-24 NOTE — Progress Notes (Signed)
Patient ID: Joseph Harvey                 DOB: 1955/12/04                    MRN: [id number] ? ? ? ? ?HPI: ?Joseph Harvey is a 66 y.o. male patient referred to pharmacy clinic by Joseph Harvey to initiate weight loss therapy with GLP1-RA. PMH is significant for obesity, HTN, T2DM (A1c 7.3) and HLD. Most recent BMI 36.18. ? ?Patient presents today in good spirits.  Researched GLP1a before starting and looked into Ozempic and Trulicity.  Believes his plan covers Trulicity.   ? ?Eats fish, subs, fruit, meatballs, and for breakfast has steak, egg and cheese bagels.  Drinks water and cranberry juice, does not know if it is sugar free.  Is only managed on metformin for DM which he does not take daily. ? ? ?Labs: ?Lab Results  ?Component Value Date  ? HGBA1C 7.3 (H) 06/15/2021  ? ? ?Wt Readings from Last 1 Encounters:  ?08/10/21 238 lb (108 kg)  ? ? ?BP Readings from Last 1 Encounters:  ?08/11/21 122/77  ? ?Pulse Readings from Last 1 Encounters:  ?08/11/21 79  ? ? ?   ?Component Value Date/Time  ? CHOL 184 03/25/2021 0926  ? TRIG 472 (H) 03/25/2021 0926  ? HDL 61 03/25/2021 0926  ? CHOLHDL 3.0 03/25/2021 0926  ? LDLCALC 53 03/25/2021 0926  ? ? ?Past Medical History:  ?Diagnosis Date  ? Allergy   ? Dust, mold, dust mites  ? Anemia   ? Asthma   ? Cancer [facility])   ? prostate  ? Cataract   ? bilateral repair.  ? COVID   ? Diabetes mellitus without complication ([place])   ? Glaucoma   ? Hyperlipidemia   ? Hypertension   ? Neuromuscular disorder ([place])   ? 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 Pneumonia   ? ? ?Current Outpatient Medications on File Prior to Visit  ?Medication Sig Dispense Refill  ? acetaminophen (TYLENOL) 325 MG tablet Take 2 tablets (650 mg total) by mouth every 4 (four) hours as needed for headache or mild pain.    ? albuterol (PROVENTIL) (2.5 MG/3ML) 0.083% nebulizer solution Take 3 mLs (2.5 mg total) by nebulization every 4 (four) hours as needed for wheezing or shortness of breath (((PLAN B))). 150 mL 0  ? albuterol  (VENTOLIN HFA) 108 (90 Base) MCG/ACT inhaler Inhale 2 puffs into the lungs every 4 hours as needed for shortness of breath or wheezing 18 g 5  ? amLODipine (NORVASC) 5 MG tablet TAKE 1 TABLET (5 MG TOTAL) BY MOUTH 2 (TWO) TIMES DAILY. 180 tablet 3  ? ASPIRIN 81 PO Take 81 mg by mouth daily.    ? baclofen (LIORESAL) 10 MG tablet take 1 tablet by mouth 3 times a day (Patient taking differently: Take 10 mg by mouth 3 (three) times daily as needed (Nerve pain).) 270 tablet 3  ? benzonatate (TESSALON) 100 MG capsule Take 1 capsule (100 mg total) by mouth every 8 (eight) hours. (Patient taking differently: Take 100 mg by mouth 2 (two) times daily as needed for cough.) 21 capsule 0  ? Blood Glucose Monitoring Suppl (TRUE METRIX METER) w/Device KIT Use as directed 1 kit 0  ? Budeson-Glycopyrrol-Formoterol (BREZTRI AEROSPHERE) 160-9-4.8 MCG/ACT AERO Inhale 2 puffs into the lungs in the morning and at bedtime. 10.7 g 11  ? budesonide-formoterol (SYMBICORT) 160-4.5 MCG/ACT inhaler Inhale 2 puffs into the  lungs 2 (two) times daily. (Patient not taking: Reported on 08/05/2021) 10.2 g 3  ? buPROPion (WELLBUTRIN XL) 150 MG 24 hr tablet TAKE 1 TABLET (150 MG TOTAL) BY MOUTH DAILY. (Patient not taking: Reported on 08/05/2021) 90 tablet 0  ? busPIRone (BUSPAR) 10 MG tablet Take 1 tablet (10 mg total) by mouth 3 (three) times daily. (Patient taking differently: Take 10 mg by mouth 3 (three) times daily as needed (anxiety).) 90 tablet 3  ? Cholecalciferol (VITAMIN D PO) Take 1 tablet by mouth daily.    ? ezetimibe (ZETIA) 10 MG tablet Take 1 tablet (10 mg total) by mouth daily. 90 tablet 3  ? FLOVENT HFA 220 MCG/ACT inhaler Inhale 1 puff into the lungs daily. (Patient not taking: Reported on 08/05/2021) 12 g 12  ? fluticasone (FLONASE) 50 MCG/ACT nasal spray PLACE 2 SPRAYS INTO BOTH NOSTRILS DAILY. 16 g 11  ? gabapentin (NEURONTIN) 300 MG capsule take 1 capsule by mouth in the morning, then take 1 at lunch and 2 at bedtime (Patient taking  differently: Take 300 mg by mouth 3 (three) times daily as needed (Nerve pain).) 360 capsule 3  ? glucose blood (TRUE METRIX BLOOD GLUCOSE TEST) test strip Use as instructed 100 each 12  ? hydrochlorothiazide (HYDRODIURIL) 25 MG tablet Take 1 tablet (25 mg total) by mouth daily. 90 tablet 3  ? HYDROcodone bit-homatropine (HYCODAN) 5-1.5 MG/5ML syrup Take 5 mLs by mouth every 6 (six) hours as needed for cough. 75 mL 0  ? ipratropium-albuterol (DUONEB) 0.5-2.5 (3) MG/3ML SOLN USE 1 VIAL VIA NEBULIZER TWICE A DAY (Patient taking differently: 3 mLs daily as needed (Shortness of breath).) 180 mL 6  ? linaclotide (LINZESS) 72 MCG capsule Take 1 capsule (72 mcg total) by mouth daily before breakfast. (Patient taking differently: Take 72 mcg by mouth daily as needed (constipation).) 30 capsule 6  ? metFORMIN (GLUCOPHAGE-XR) 500 MG 24 hr tablet TAKE 1 TABLET BY MOUTH DAILY. 90 tablet 1  ? montelukast (SINGULAIR) 10 MG tablet Take 1 tablet (10 mg total) by mouth at bedtime. 30 tablet 2  ? Nebivolol HCl 20 MG TABS Take 1 tablet (20 mg total) by mouth 2 (two) times daily. 90 tablet 5  ? pantoprazole (PROTONIX) 40 MG tablet Take 1 tablet (40 mg total) by mouth daily as needed. 30 tablet 2  ? polyethylene glycol powder (GLYCOLAX/MIRALAX) 17 GM/SCOOP powder Take 17 g by mouth daily as needed. (Patient taking differently: Take 17 g by mouth daily as needed for mild constipation or moderate constipation.) 3350 g 1  ? potassium chloride SA (KLOR-CON) 20 MEQ tablet Take 20 mEq by mouth daily as needed (When hands cramp up).    ? predniSONE (DELTASONE) 10 MG tablet Take 30 mg by mouth daily as needed (Shortness of breath).    ? Respiratory Therapy Supplies (FLUTTER) DEVI Use three times a day after inhaler or nebulizer use 1 each 0  ? rosuvastatin (CRESTOR) 40 MG tablet Take 1 tablet (40 mg total) by mouth daily. 90 tablet 3  ? TRUEplus Lancets 28G MISC Use as directed 100 each 4  ? umeclidinium-vilanterol (ANORO ELLIPTA) 62.5-25  MCG/ACT AEPB Inhale 1 puff into the lungs daily at 6 (six) AM. (Patient not taking: Reported on 08/05/2021) 1 each 5  ? valsartan (DIOVAN) 320 MG tablet Take 1 tablet (320 mg total) by mouth daily. 90 tablet 3  ? VENTOLIN HFA 108 (90 Base) MCG/ACT inhaler Inhale 2 puffs into the lungs every 4 (four) hours as  needed for wheezing or shortness of breath. 18 g 6  ? ?No current facility-administered medications on file prior to visit.  ? ? ?Allergies  ?Allergen Reactions  ? Amoxicillin Anaphylaxis  ?  Has patient had a PCN reaction causing immediate rash, facial/tongue/throat swelling, SOB or lightheadedness with hypotension: Yes ?Has patient had a PCN reaction causing severe rash involving mucus membranes or skin necrosis: No ?Has patient had a PCN reaction that required hospitalization: Yes ?Has patient had a PCN reaction occurring within the last 10 years: Yes ?If all of the above answers are "NO", then may proceed with Cephalosporin use. ?  ? Lisinopril Shortness Of Breath  ? Molds & Smuts Anaphylaxis  ? Robitussin [Guaifenesin] Shortness Of Breath  ?  wheezing  ? Azithromycin Other (See Comments)  ? ? ? ?Assessment/Plan: ? ?1. Weight loss - Patient current A1c is 7.3 which is above goal of <7.0%.  Recommended patient begin taking metformin as prescribed.  For extra A1c lowering and cardio protection recommend addition of GLP1. Discussed Ozempic and Trulicity. Confirmed patient has no personal or family history of medullary thyroid carcinoma (MTC) or Multiple Endocrine Neoplasia syndrome type 2 (MEN 2).  ? ?Advised patient on common side effects including nausea, diarrhea, dyspepsia, decreased appetite, and fatigue. Counseled patient on reducing meal size and how to titrate medication to minimize side effects. Counseled patient to call if intolerable side effects or if experiencing dehydration, abdominal pain, or dizziness. Patient will adhere to dietary modifications and will target at least 150 minutes of moderate  intensity exercise weekly.  ? ?Sent in Rx for Ozempic, however pharmacy responded that Trulcitiy is preferred.  Changed prescription and patient is aware. ? ?Joseph Harvey, PharmD, BCACP, CDCES, CPP ?[id number]

## 2021-09-02 ENCOUNTER — Other Ambulatory Visit: Payer: Self-pay

## 2021-09-03 ENCOUNTER — Other Ambulatory Visit: Payer: Self-pay

## 2021-09-03 ENCOUNTER — Ambulatory Visit: Payer: Managed Care, Other (non HMO) | Admitting: Podiatry

## 2021-09-05 ENCOUNTER — Other Ambulatory Visit: Payer: Self-pay | Admitting: Cardiology

## 2021-09-05 ENCOUNTER — Other Ambulatory Visit: Payer: Self-pay | Admitting: Internal Medicine

## 2021-09-05 DIAGNOSIS — J4550 Severe persistent asthma, uncomplicated: Secondary | ICD-10-CM

## 2021-09-05 DIAGNOSIS — I152 Hypertension secondary to endocrine disorders: Secondary | ICD-10-CM

## 2021-09-05 DIAGNOSIS — E1159 Type 2 diabetes mellitus with other circulatory complications: Secondary | ICD-10-CM

## 2021-09-06 ENCOUNTER — Other Ambulatory Visit: Payer: Self-pay

## 2021-09-06 MED ORDER — AMLODIPINE BESYLATE 5 MG PO TABS
ORAL_TABLET | Freq: Two times a day (BID) | ORAL | 3 refills | Status: DC
  Filled 2021-09-06: qty 60, 30d supply, fill #0
  Filled 2021-10-07: qty 60, 30d supply, fill #1

## 2021-09-06 MED ORDER — VALSARTAN 320 MG PO TABS
320.0000 mg | ORAL_TABLET | Freq: Every day | ORAL | 3 refills | Status: DC
  Filled 2021-09-06: qty 30, 30d supply, fill #0
  Filled 2021-10-16: qty 30, 30d supply, fill #1
  Filled 2021-11-18: qty 30, 30d supply, fill #2
  Filled 2021-12-20: qty 30, 30d supply, fill #3
  Filled 2022-01-19: qty 30, 30d supply, fill #4
  Filled 2022-02-16: qty 30, 30d supply, fill #5
  Filled 2022-03-24: qty 30, 30d supply, fill #6
  Filled 2022-05-05: qty 30, 30d supply, fill #7
  Filled 2022-06-07: qty 30, 30d supply, fill #8
  Filled 2022-06-30: qty 90, 90d supply, fill #9
  Filled ????-??-??: fill #9

## 2021-09-07 ENCOUNTER — Other Ambulatory Visit: Payer: Self-pay

## 2021-09-07 ENCOUNTER — Encounter: Payer: Self-pay | Admitting: Internal Medicine

## 2021-09-07 DIAGNOSIS — J4550 Severe persistent asthma, uncomplicated: Secondary | ICD-10-CM

## 2021-09-07 MED ORDER — ALBUTEROL SULFATE (2.5 MG/3ML) 0.083% IN NEBU
2.5000 mg | INHALATION_SOLUTION | RESPIRATORY_TRACT | 6 refills | Status: DC | PRN
  Filled 2021-09-07: qty 150, 9d supply, fill #0
  Filled 2021-09-22: qty 90, 5d supply, fill #1
  Filled 2021-10-01: qty 90, 5d supply, fill #2
  Filled 2021-10-04 – 2021-10-14 (×2): qty 90, 5d supply, fill #3

## 2021-09-07 NOTE — Telephone Encounter (Signed)
Requested medication (s) are due for refill today:   Yes ? ?Requested medication (s) are on the active medication list:   Yes for both ? ?Future visit scheduled:   Yes ? ? ?Last ordered: albuterol 08/12/2021, 150 ml, 0 refills;   Nebivolol  09/03/2020 #90, 5 refills ? ?Returned because an alternative is being requested by pharmacy fr the Nebivolol ?  ? ?Requested Prescriptions  ?Pending Prescriptions Disp Refills  ? albuterol (PROVENTIL) (2.5 MG/3ML) 0.083% nebulizer solution 150 mL 0  ?  Sig: Take 3 mLs (2.5 mg total) by nebulization every 4 (four) hours as needed for wheezing or shortness of breath (((PLAN B))).  ?  ? Pulmonology:  Beta Agonists 2 Passed - 09/05/2021  3:15 PM  ?  ?  Passed - Last BP in normal range  ?  BP Readings from Last 1 Encounters:  ?08/24/21 118/75  ?  ?  ?  ?  Passed - Last Heart Rate in normal range  ?  Pulse Readings from Last 1 Encounters:  ?08/24/21 84  ?  ?  ?  ?  Passed - Valid encounter within last 12 months  ?  Recent Outpatient Visits   ? ?      ? 2 months ago Severe persistent asthma with allergic rhinitis without complication  ? [place] Karle Plumber B, MD  ? 2 months ago Severe persistent asthma without complication  ? [place] Karle Plumber B, MD  ? 3 months ago Sinusitis, unspecified chronicity, unspecified location  ? [place], [place], [place]  ? 7 months ago Hypertension associated with type 2 diabetes mellitus ([place])  ? [place] Ladell Pier, MD  ? 1 year ago Hypertension associated with type 2 diabetes mellitus ([place])  ? [place] Ladell Pier, MD  ? ?  ?  ?Future Appointments   ? ?        ? In 1 week [place], Ocie Doyne, MD [place], [facility]  ? In 2 weeks Ladell Pier, MD [place]  ? In 2 months Donato Heinz, MD  [place] [facility], [facility]  ? In 4 months Donato Heinz, MD [place] [facility], [facility]  ? ?  ? ? ?  ?  ?  ? Nebivolol HCl 20 MG TABS 90 tablet 5  ?  Sig: Take 1 tablet (20 mg total) by mouth 2 (two) times daily.  ?  ? Cardiovascular: Beta Blockers 3 Passed - 09/05/2021  3:15 PM  ?  ?  Passed - Cr in normal range and within 360 days  ?  Creatinine, Ser  ?Date Value Ref Range Status  ?08/03/2021 1.21 0.76 - 1.27 mg/dL Final  ?  ?  ?  ?  Passed - AST in normal range and within 360 days  ?  AST  ?Date Value Ref Range Status  ?03/25/2021 14 0 - 40 IU/L Final  ?  ?  ?  ?  Passed - ALT in normal range and within 360 days  ?  ALT  ?Date Value Ref Range Status  ?03/25/2021 17 0 - 44 IU/L Final  ?  ?  ?  ?  Passed - Last BP in normal range  ?  BP Readings from Last 1 Encounters:  ?08/24/21 118/75  ?  ?  ?  ?  Passed - Last Heart Rate in normal range  ?  Pulse Readings from Last 1 Encounters:  ?08/24/21 84  ?  ?  ?  ?  Passed - Valid encounter within last 6 months  ?  Recent Outpatient Visits   ? ?      ? 2 months ago Severe persistent asthma with allergic rhinitis without complication  ? [place] Karle Plumber B, MD  ? 2 months ago Severe persistent asthma without complication  ? [place] Karle Plumber B, MD  ? 3 months ago Sinusitis, unspecified chronicity, unspecified location  ? [place], [place], [place]  ? 7 months ago Hypertension associated with type 2 diabetes mellitus ([place])  ? [place] Ladell Pier, MD  ? 1 year ago Hypertension associated with type 2 diabetes mellitus ([place])  ? [place] Ladell Pier, MD  ? ?  ?  ?Future Appointments   ? ?        ? In 1 week [place], Ocie Doyne, MD [place], [facility]  ? In 2 weeks Ladell Pier, MD [place]  ? In  2 months Donato Heinz, MD [place] [facility], [facility]  ? In 4 months Donato Heinz, MD [place] [facility], [facility]  ? ?  ? ? ?  ?  ?  ? ?

## 2021-09-08 ENCOUNTER — Other Ambulatory Visit: Payer: Self-pay

## 2021-09-08 MED ORDER — NEBIVOLOL HCL 20 MG PO TABS
20.0000 mg | ORAL_TABLET | Freq: Two times a day (BID) | ORAL | 0 refills | Status: DC
  Filled 2021-09-08: qty 60, 30d supply, fill #0
  Filled 2021-10-07: qty 60, 30d supply, fill #1
  Filled 2021-11-08: qty 60, 30d supply, fill #2

## 2021-09-10 ENCOUNTER — Encounter: Payer: Self-pay | Admitting: Pharmacist

## 2021-09-10 DIAGNOSIS — E119 Type 2 diabetes mellitus without complications: Secondary | ICD-10-CM

## 2021-09-10 DIAGNOSIS — E1169 Type 2 diabetes mellitus with other specified complication: Secondary | ICD-10-CM

## 2021-09-13 MED ORDER — TRULICITY 1.5 MG/0.5ML ~~LOC~~ SOAJ: 1.5000 mg | SUBCUTANEOUS | 2 refills | Status: DC

## 2021-09-13 NOTE — Addendum Note (Signed)
Addended by: Rollen Sox on: 09/13/2021 08:00 AM ? ? Modules accepted: Orders ? ?

## 2021-09-15 DIAGNOSIS — H52223 Regular astigmatism, bilateral: Secondary | ICD-10-CM | POA: Diagnosis not present

## 2021-09-15 DIAGNOSIS — H5213 Myopia, bilateral: Secondary | ICD-10-CM | POA: Diagnosis not present

## 2021-09-15 DIAGNOSIS — Z961 Presence of intraocular lens: Secondary | ICD-10-CM | POA: Diagnosis not present

## 2021-09-16 ENCOUNTER — Ambulatory Visit (INDEPENDENT_AMBULATORY_CARE_PROVIDER_SITE_OTHER): Payer: No Typology Code available for payment source | Admitting: Cardiology

## 2021-09-16 ENCOUNTER — Telehealth: Payer: No Typology Code available for payment source | Admitting: Pharmacy Technician

## 2021-09-16 ENCOUNTER — Encounter: Payer: Self-pay | Admitting: Cardiology

## 2021-09-16 VITALS — BP 108/68 | HR 114 | Ht 69.0 in | Wt 239.0 lb

## 2021-09-16 DIAGNOSIS — R0602 Shortness of breath: Secondary | ICD-10-CM

## 2021-09-16 DIAGNOSIS — I493 Ventricular premature depolarization: Secondary | ICD-10-CM | POA: Diagnosis not present

## 2021-09-16 NOTE — Progress Notes (Signed)
? ?Electrophysiology Office Note ? ? ?Date:  09/16/2021  ? ?ID:  Joseph Harvey, DOB 1956-01-18, MRN [id number] ? ?PCP:  Ladell Pier, MD  ?Cardiologist:  Gardiner Rhyme ?Primary Electrophysiologist:  Will Meredith Leeds, MD   ? ?Chief Complaint: PVC ?  ?History of Present Illness: ?Joseph Harvey is a 66 y.o. male who is being seen today for the evaluation of PVC at the request of Ladell Pier, MD. Presenting today for electrophysiology evaluation. ? ?He has a history significant for prostate cancer in remission, type 2 diabetes, hypertension, hyperlipidemia, tobacco abuse, hypertrophic cardiomyopathy, PVCs.  He presented to the hospital 04/14/2020 with chest discomfort on minimal exertion.  Echo showed severe septal hypertrophy and otherwise mild concentric LVH.  Cardiac MRI showed symmetric septal hypertrophy with a 20 mm septum and 11% LGE.  Was noted to have PVCs.  He is status post PVC ablation 08/10/2021.  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 ? ?He did well after his ablation.  Unfortunately over the last couple weeks, he has been more short of breath.  He presents to clinic today and is tachycardic with heart rates of 114.  He is short of breath both at rest and with exertion.  He has had multiple inhalers today which she feels may be contributing.  His blood pressure is also mildly low, but he has not had dizziness. ? ?Today, denies symptoms of palpitations, chest pain, orthopnea, PND, lower extremity edema, claudication, dizziness, presyncope, syncope, bleeding, or neurologic sequela. The patient is tolerating medications without difficulties.   ? ? ?Past Medical History:  ?Diagnosis Date  ? Allergy   ? Dust, mold, dust mites  ? Anemia   ? Asthma   ? Cancer [facility])   ? prostate  ? Cataract   ? bilateral repair.  ? COVID   ? Diabetes mellitus without complication ([place])   ? Glaucoma   ? Hyperlipidemia   ? Hypertension   ? Neuromuscular disorder ([place])   ? 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 Pneumonia   ? ?Past Surgical  History:  ?Procedure Laterality Date  ? BACK SURGERY  2017  ? CATARACT EXTRACTION Bilateral   ? COLONOSCOPY    ? 2013  ? COLONOSCOPY  10/03/2019  ? KNEE ARTHROSCOPY  2005  ? right  ? PROSTATE SURGERY    ? PVC ABLATION N/A 08/10/2021  ? Procedure: PVC ABLATION;  Surgeon: Constance Haw, MD;  Location: [place] [facility];  Service: Cardiovascular;  Laterality: N/A;  ? ROBOT ASSISTED LAPAROSCOPIC RADICAL PROSTATECTOMY  03/2010  ? ? ? ?Current Outpatient Medications  ?Medication Sig Dispense Refill  ? acetaminophen (TYLENOL) 325 MG tablet Take 2 tablets (650 mg total) by mouth every 4 (four) hours as needed for headache or mild pain.    ? albuterol (PROVENTIL) (2.5 MG/3ML) 0.083% nebulizer solution Take 3 mLs (2.5 mg total) by nebulization every 4 (four) hours as needed for wheezing or shortness of breath (((PLAN B))). 150 mL 6  ? albuterol (VENTOLIN HFA) 108 (90 Base) MCG/ACT inhaler Inhale 2 puffs into the lungs every 4 hours as needed for shortness of breath or wheezing 18 g 5  ? amLODipine (NORVASC) 5 MG tablet TAKE 1 TABLET (5 MG TOTAL) BY MOUTH 2 (TWO) TIMES DAILY. 180 tablet 3  ? ASPIRIN 81 PO Take 81 mg by mouth daily.    ? baclofen (LIORESAL) 10 MG tablet take 1 tablet by mouth 3 times a day (Patient taking differently: Take 10 mg by mouth 3 (three) times  daily as needed (Nerve pain).) 270 tablet 3  ? benzonatate (TESSALON) 100 MG capsule Take 1 capsule (100 mg total) by mouth every 8 (eight) hours. (Patient taking differently: Take 100 mg by mouth 2 (two) times daily as needed for cough.) 21 capsule 0  ? Blood Glucose Monitoring Suppl (TRUE METRIX METER) w/Device KIT Use as directed 1 kit 0  ? Budeson-Glycopyrrol-Formoterol (BREZTRI AEROSPHERE) 160-9-4.8 MCG/ACT AERO Inhale 2 puffs into the lungs in the morning and at bedtime. 10.7 g 11  ? busPIRone (BUSPAR) 10 MG tablet Take 1 tablet (10 mg total) by mouth 3 (three) times daily. (Patient taking differently: Take 10 mg by mouth 3 (three) times daily as  needed (anxiety).) 90 tablet 3  ? Cholecalciferol (VITAMIN D PO) Take 1 tablet by mouth daily.    ? Dulaglutide (TRULICITY) 1.5 JG/8.1LX SOPN Inject 1.5 mg into the skin once a week. 2 mL 2  ? ezetimibe (ZETIA) 10 MG tablet Take 1 tablet (10 mg total) by mouth daily. 90 tablet 3  ? fluticasone (FLONASE) 50 MCG/ACT nasal spray PLACE 2 SPRAYS INTO BOTH NOSTRILS DAILY. 16 g 11  ? gabapentin (NEURONTIN) 300 MG capsule take 1 capsule by mouth in the morning, then take 1 at lunch and 2 at bedtime (Patient taking differently: Take 300 mg by mouth 3 (three) times daily as needed (Nerve pain).) 360 capsule 3  ? glucose blood (TRUE METRIX BLOOD GLUCOSE TEST) test strip Use as instructed 100 each 12  ? hydrochlorothiazide (HYDRODIURIL) 25 MG tablet Take 1 tablet (25 mg total) by mouth daily. 90 tablet 3  ? HYDROcodone bit-homatropine (HYCODAN) 5-1.5 MG/5ML syrup Take 5 mLs by mouth every 6 (six) hours as needed for cough. 75 mL 0  ? ipratropium-albuterol (DUONEB) 0.5-2.5 (3) MG/3ML SOLN USE 1 VIAL VIA NEBULIZER TWICE A DAY (Patient taking differently: 3 mLs daily as needed (Shortness of breath).) 180 mL 6  ? linaclotide (LINZESS) 72 MCG capsule Take 1 capsule (72 mcg total) by mouth daily before breakfast. (Patient taking differently: Take 72 mcg by mouth daily as needed (constipation).) 30 capsule 6  ? metFORMIN (GLUCOPHAGE-XR) 500 MG 24 hr tablet TAKE 1 TABLET BY MOUTH DAILY. 90 tablet 1  ? montelukast (SINGULAIR) 10 MG tablet Take 1 tablet (10 mg total) by mouth at bedtime. 30 tablet 2  ? Nebivolol HCl 20 MG TABS Take 1 tablet (20 mg total) by mouth 2 (two) times daily. 180 tablet 0  ? pantoprazole (PROTONIX) 40 MG tablet Take 1 tablet (40 mg total) by mouth daily as needed. 30 tablet 2  ? polyethylene glycol powder (GLYCOLAX/MIRALAX) 17 GM/SCOOP powder Take 17 g by mouth daily as needed. (Patient taking differently: Take 17 g by mouth daily as needed for mild constipation or moderate constipation.) 3350 g 1  ? potassium  chloride SA (KLOR-CON) 20 MEQ tablet Take 20 mEq by mouth daily as needed (When hands cramp up).    ? predniSONE (DELTASONE) 10 MG tablet Take 30 mg by mouth daily as needed (Shortness of breath).    ? Respiratory Therapy Supplies (FLUTTER) DEVI Use three times a day after inhaler or nebulizer use 1 each 0  ? rosuvastatin (CRESTOR) 40 MG tablet Take 1 tablet (40 mg total) by mouth daily. 90 tablet 3  ? TRUEplus Lancets 28G MISC Use as directed 100 each 4  ? valsartan (DIOVAN) 320 MG tablet Take 1 tablet (320 mg total) by mouth daily. 90 tablet 3  ? VENTOLIN HFA 108 (90 Base) MCG/ACT  inhaler Inhale 2 puffs into the lungs every 4 (four) hours as needed for wheezing or shortness of breath. (Patient not taking: Reported on 09/16/2021) 18 g 6  ? ?No current facility-administered medications for this visit.  ? ? ?Allergies:   Amoxicillin, Lisinopril, Molds & smuts, Robitussin [guaifenesin], and Azithromycin  ? ?Social History:  The patient  reports that he has been smoking cigarettes. He started smoking about 47 years ago. He has a 23.00 pack-year smoking history. He has never used smokeless tobacco. He reports that he does not currently use alcohol after a past usage of about 3.0 standard drinks per week. He reports that he does not use drugs.  ? ?Family History:  The patient's family history includes Allergies in his brother; Asthma in his mother; Colon cancer in his father; Diabetes in his maternal grandmother; Lung cancer in his father and mother; Prostate cancer in his father.  ? ?ROS:  Please see the history of present illness.   Otherwise, review of systems is positive for none.   All other systems are reviewed and negative.  ? ?PHYSICAL EXAM: ?VS:  BP 108/68   Pulse (!) 114   Ht 5' 9" (1.753 m)   Wt 239 lb (108.4 kg)   SpO2 93%   BMI 35.29 kg/m?  , BMI Body mass index is 35.29 kg/m?. ?GEN: Well nourished, well developed, in no acute distress  ?HEENT: normal  ?Neck: no JVD, carotid bruits, or masses ?Cardiac:  Tachycardic, regular RRR; no murmurs, rubs, or gallops,no edema  ?Respiratory:  clear to auscultation bilaterally, normal work of breathing ?GI: soft, nontender, nondistended, + BS ?MS: no deformity or atrophy  ?Ski

## 2021-09-16 NOTE — Telephone Encounter (Signed)
Submitted a Prior Authorization request to [facility] for  Breztri inhaler  via Fax. Will update once we receive a response. ? ? ?PA# [id number] ?Fax# [phone] ?Phone# [phone] ?  ?

## 2021-09-16 NOTE — Patient Instructions (Signed)
Medication Instructions:  ?Your physician recommends that you continue on your current medications as directed. Please refer to the Current Medication list given to you today. ? ?*If you need a refill on your cardiac medications before your next appointment, please call your pharmacy* ? ? ?Lab Work: ?Today: BNP ? ?If you have labs (blood work) drawn today and your tests are completely normal, you will receive your results only by: ?MyChart Message (if you have MyChart) OR ?A paper copy in the mail ?If you have any lab test that is abnormal or we need to change your treatment, we will call you to review the results. ? ? ?Testing/Procedures: ?None ordered ? ? ?Follow-Up: ?At [facility], you and your health needs are our priority.  As part of our continuing mission to provide you with exceptional heart care, we have created designated Provider Care Teams.  These Care Teams include your primary Cardiologist (physician) and Advanced Practice Providers (APPs -  Physician Assistants and Nurse Practitioners) who all work together to provide you with the care you need, when you need it. ? ?Your next appointment:   ?To be  determined ? ?The format for your next appointment:   ?In Person ? ?Provider:   ?Allegra Lai, MD  ? ? ?Thank you for choosing [facility]!! ? ? ?Trinidad Curet, RN ?([phone] ? ?Other Instructions ? ? ?Important Information About Sugar ? ? ? ? ? ? ? ? ?  ?

## 2021-09-17 ENCOUNTER — Other Ambulatory Visit (HOSPITAL_COMMUNITY): Payer: Self-pay

## 2021-09-17 LAB — PRO B NATRIURETIC PEPTIDE: NT-Pro BNP: 336 pg/mL — ABNORMAL HIGH (ref 0–210)

## 2021-09-17 NOTE — Telephone Encounter (Signed)
Received notification from  [place]  regarding a [place] for  [employer] . Authorization has been APPROVED from 09/13/2021 to 09/16/2022. Approval letter sent to scan center. ? ?Test claim could not be run due to [facility]. ?

## 2021-09-20 ENCOUNTER — Encounter: Payer: Self-pay | Admitting: Cardiology

## 2021-09-20 NOTE — Telephone Encounter (Signed)
Spoke to patient about emails today.Stated he has been having sob for the past 1 month,worse this past weekend.Stated hard to walk much distance without being sob.Weight stable.Stated he has lost a couple of lbs.No swelling.Stated he saw Dr.Camnitz recently and Dr.Camnitz is going to be checking with Dr.Schumann about changing his B/P meds.He saw on mychart bnp was elevated.He wanted to know how much water he should be drinking daily.Appointment scheduled with Dr.Schumann 5/12 at 8:40 am.I will send message to [place]. ?

## 2021-09-20 NOTE — Progress Notes (Signed)
?Cardiology Office Note:   ? ?Date:  09/24/2021  ? ?ID:  Joseph Harvey, DOB Jan 22, 1956, MRN [id number] ? ?PCP:  Ladell Pier, MD  ?Cardiologist:  Donato Heinz, MD  ?Electrophysiologist:  Will Meredith Leeds, MD  ? ?Referring MD: Ladell Pier, MD  ? ?Chief Complaint  ?Patient presents with  ? Follow-up  ? ? ?History of Present Illness:   ? ?Joseph Harvey is a 66 y.o. male with a hx of prostate cancer in remission, T2DM, hypertension, hyperlipidemia, [place] use who presents for follow-up.  He was referred by Dr. Wynetta Emery for evaluation of dyspnea on exertion, intially seen on 04/14/20.  Hospital he reports that he has been having chest pain with minimal exertion.  Also describes having chest pain.  Reports sharp left-sided chest pain, can occur at rest or with exertion.  Typically last for 10 minutes or so.  Also states he has been having occasional lightheadedness, denies any syncope.  Denies any lower extremity edema.  Reports BP has been anywhere from 110s to 160s when he checks at home.  Reports he had a prior test for sleep apnea which was negative.  He has smoked since age 31, less than 1 pack/day.  No known history of heart disease in his immediate family. ? ?Echocardiogram on 03/18/2020 showed severe septal hypertrophy with otherwise mild concentric hypertrophy, mild intracavitary gradient (peak gradient 30 mmHg), normal biventricular function, grade 1 diastolic dysfunction, no significant valvular disease.  Lexiscan Myoview on 04/29/2020 showed normal perfusion, EF 57%.  Preventives monitor x3 days on 05/06/2020 showed 7 episodes of NSVT, longest lasting 3 beats, occasional PVCs (3.9% of beats).  Calcium score 117 on 04/29/2020 (81st percentile).  Cardiac MRI 06/23/2020 showed asymmetric hypertrophy measuring up to 20 mm in basal anterior septum (8 mm and posterior wall), consistent with hypertrophic cardiomyopathy.  Also with patchy LGE in basal septum and RV insertion sites consistent with  HCM; LGE accounts for 11% of total myocardial mass.  Normal LV/RV size and systolic function.  Echo on 04/14/2021 showed EF of 60 to 65%, severe asymmetric hypertrophy of basal septum, no LVOT obstruction, grade 1 diastolic dysfunction, normal RV function, no significant valvular disease.  Referred to EP for evaluation, underwent PVC ablation with Dr. Curt Bears 08/10/2021 ? ?Since last clinic visit, he reports that he is doing okay.  Started having shortness of breath at the end of April.  Decreased fluid intake and currently feels improved.  Reports chest pain when lies on left side.  Had 1 episode of lightheadedness, denies any syncope.  No lower extremity edema or palpitations.  Reports compliance with CPAP.  Smoking up to 0.5 packs/day. ? ? ?BP Readings from Last 3 Encounters:  ?09/24/21 114/68  ?09/16/21 108/68  ?08/24/21 118/75  ? ? ?Wt Readings from Last 3 Encounters:  ?09/24/21 235 lb 3.2 oz (106.7 kg)  ?09/16/21 239 lb (108.4 kg)  ?08/24/21 245 lb (111.1 kg)  ? ? ? ?Past Medical History:  ?Diagnosis Date  ? Allergy   ? Dust, mold, dust mites  ? Anemia   ? Asthma   ? Cancer [facility])   ? prostate  ? Cataract   ? bilateral repair.  ? COVID   ? Diabetes mellitus without complication ([place])   ? Glaucoma   ? Hyperlipidemia   ? Hypertension   ? Neuromuscular disorder ([place])   ? 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 Pneumonia   ? ? ?Past Surgical History:  ?Procedure Laterality Date  ? BACK SURGERY  2017  ? CATARACT EXTRACTION Bilateral   ? COLONOSCOPY    ? 2013  ? COLONOSCOPY  10/03/2019  ? KNEE ARTHROSCOPY  2005  ? right  ? PROSTATE SURGERY    ? PVC ABLATION N/A 08/10/2021  ? Procedure: PVC ABLATION;  Surgeon: Constance Haw, MD;  Location: [place] [facility];  Service: Cardiovascular;  Laterality: N/A;  ? ROBOT ASSISTED LAPAROSCOPIC RADICAL PROSTATECTOMY  03/2010  ? ? ?Current Medications: ?Current Meds  ?Medication Sig  ? acetaminophen (TYLENOL) 325 MG tablet Take 2 tablets (650 mg total) by mouth every 4 (four) hours  as needed for headache or mild pain.  ? albuterol (PROVENTIL) (2.5 MG/3ML) 0.083% nebulizer solution Take 3 mLs (2.5 mg total) by nebulization every 4 (four) hours as needed for wheezing or shortness of breath (((PLAN B))).  ? albuterol (VENTOLIN HFA) 108 (90 Base) MCG/ACT inhaler Inhale 2 puffs into the lungs every 4 hours as needed for shortness of breath or wheezing  ? amLODipine (NORVASC) 5 MG tablet TAKE 1 TABLET (5 MG TOTAL) BY MOUTH 2 (TWO) TIMES DAILY.  ? ASPIRIN 81 PO Take 81 mg by mouth daily.  ? baclofen (LIORESAL) 10 MG tablet take 1 tablet by mouth 3 times a day (Patient taking differently: Take 10 mg by mouth 3 (three) times daily as needed (Nerve pain).)  ? benzonatate (TESSALON) 100 MG capsule Take 1 capsule (100 mg total) by mouth every 8 (eight) hours. (Patient taking differently: Take 100 mg by mouth 2 (two) times daily as needed for cough.)  ? Blood Glucose Monitoring Suppl (TRUE METRIX METER) w/Device KIT Use as directed  ? Budeson-Glycopyrrol-Formoterol (BREZTRI AEROSPHERE) 160-9-4.8 MCG/ACT AERO Inhale 2 puffs into the lungs in the morning and at bedtime.  ? busPIRone (BUSPAR) 10 MG tablet Take 1 tablet (10 mg total) by mouth 3 (three) times daily. (Patient taking differently: Take 10 mg by mouth 3 (three) times daily as needed (anxiety).)  ? Cholecalciferol (VITAMIN D PO) Take 1 tablet by mouth daily.  ? Dulaglutide (TRULICITY) 1.5 BD/5.3GD SOPN Inject 1.5 mg into the skin once a week.  ? ezetimibe (ZETIA) 10 MG tablet Take 1 tablet (10 mg total) by mouth daily.  ? fluticasone (FLONASE) 50 MCG/ACT nasal spray PLACE 2 SPRAYS INTO BOTH NOSTRILS DAILY.  ? gabapentin (NEURONTIN) 300 MG capsule take 1 capsule by mouth in the morning, then take 1 at lunch and 2 at bedtime (Patient taking differently: Take 300 mg by mouth 3 (three) times daily as needed (Nerve pain).)  ? glucose blood (TRUE METRIX BLOOD GLUCOSE TEST) test strip Use as instructed  ? hydrochlorothiazide (HYDRODIURIL) 25 MG tablet Take  1 tablet (25 mg total) by mouth daily.  ? HYDROcodone bit-homatropine (HYCODAN) 5-1.5 MG/5ML syrup Take 5 mLs by mouth every 6 (six) hours as needed for cough.  ? ipratropium-albuterol (DUONEB) 0.5-2.5 (3) MG/3ML SOLN USE 1 VIAL VIA NEBULIZER TWICE A DAY (Patient taking differently: 3 mLs daily as needed (Shortness of breath).)  ? linaclotide (LINZESS) 72 MCG capsule Take 1 capsule (72 mcg total) by mouth daily before breakfast. (Patient taking differently: Take 72 mcg by mouth daily as needed (constipation).)  ? metFORMIN (GLUCOPHAGE-XR) 500 MG 24 hr tablet TAKE 1 TABLET BY MOUTH DAILY.  ? montelukast (SINGULAIR) 10 MG tablet Take 1 tablet (10 mg total) by mouth at bedtime.  ? Nebivolol HCl 20 MG TABS Take 1 tablet (20 mg total) by mouth 2 (two) times daily.  ? pantoprazole (PROTONIX) 40 MG tablet Take  1 tablet (40 mg total) by mouth daily as needed.  ? polyethylene glycol powder (GLYCOLAX/MIRALAX) 17 GM/SCOOP powder Take 17 g by mouth daily as needed. (Patient taking differently: Take 17 g by mouth daily as needed for mild constipation or moderate constipation.)  ? potassium chloride SA (KLOR-CON) 20 MEQ tablet Take 20 mEq by mouth daily as needed (When hands cramp up).  ? predniSONE (DELTASONE) 10 MG tablet Take 30 mg by mouth daily as needed (Shortness of breath).  ? Respiratory Therapy Supplies (FLUTTER) DEVI Use three times a day after inhaler or nebulizer use  ? rosuvastatin (CRESTOR) 40 MG tablet Take 1 tablet (40 mg total) by mouth daily.  ? TRUEplus Lancets 28G MISC Use as directed  ? valsartan (DIOVAN) 320 MG tablet Take 1 tablet (320 mg total) by mouth daily.  ? VENTOLIN HFA 108 (90 Base) MCG/ACT inhaler Inhale 2 puffs into the lungs every 4 (four) hours as needed for wheezing or shortness of breath.  ?  ? ?Allergies:   Amoxicillin, Lisinopril, Molds & smuts, Robitussin [guaifenesin], and Azithromycin  ? ?Social History  ? ?Socioeconomic History  ? Marital status: Single  ?  Spouse name: Not on file  ?  Number of children: 2  ? Years of education: 12 grade  ? Highest education level: Not on file  ?Occupational History  ? Occupation: Recruitment consultant  ?  Employer: [employer]  ?Tobacco Use  ? Smoking status:

## 2021-09-22 ENCOUNTER — Other Ambulatory Visit: Payer: Self-pay

## 2021-09-24 ENCOUNTER — Ambulatory Visit (INDEPENDENT_AMBULATORY_CARE_PROVIDER_SITE_OTHER): Payer: No Typology Code available for payment source | Admitting: Cardiology

## 2021-09-24 ENCOUNTER — Ambulatory Visit (INDEPENDENT_AMBULATORY_CARE_PROVIDER_SITE_OTHER): Payer: No Typology Code available for payment source

## 2021-09-24 ENCOUNTER — Ambulatory Visit: Payer: No Typology Code available for payment source | Admitting: Internal Medicine

## 2021-09-24 ENCOUNTER — Ambulatory Visit
Admission: RE | Admit: 2021-09-24 | Discharge: 2021-09-24 | Disposition: A | Discharge status: Home / Self Care | Payer: No Typology Code available for payment source | Source: Ambulatory Visit | Attending: Cardiology | Admitting: Cardiology

## 2021-09-24 ENCOUNTER — Encounter: Payer: Self-pay | Admitting: Cardiology

## 2021-09-24 VITALS — BP 114/68 | HR 91 | Ht 69.0 in | Wt 235.2 lb

## 2021-09-24 DIAGNOSIS — R0602 Shortness of breath: Secondary | ICD-10-CM

## 2021-09-24 DIAGNOSIS — I422 Other hypertrophic cardiomyopathy: Secondary | ICD-10-CM | POA: Diagnosis not present

## 2021-09-24 DIAGNOSIS — I493 Ventricular premature depolarization: Secondary | ICD-10-CM | POA: Diagnosis not present

## 2021-09-24 DIAGNOSIS — Z720 Tobacco use: Secondary | ICD-10-CM

## 2021-09-24 NOTE — Progress Notes (Unsigned)
Enrolled patient for a 3 day Zio XT monitor to be mailed to patients home  

## 2021-09-24 NOTE — Patient Instructions (Signed)
Medication Instructions:  ?Your physician recommends that you continue on your current medications as directed. Please refer to the Current Medication list given to you today. ? ?*If you need a refill on your cardiac medications before your next appointment, please call your pharmacy* ? ? ?Lab Work: ?CMET, CBC, BNP today ? ?If you have labs (blood work) drawn today and your tests are completely normal, you will receive your results only by: ?MyChart Message (if you have MyChart) OR ?A paper copy in the mail ?If you have any lab test that is abnormal or we need to change your treatment, we will call you to review the results. ? ? ?Testing/Procedures: ?A chest x-ray takes a picture of the organs and structures inside the chest, including the heart, lungs, and blood vessels. This test can show several things, including, whether the heart is enlarges; whether fluid is building up in the lungs; and whether pacemaker / defibrillator leads are still in place. ? ?ZIO XT- Long Term Monitor Instructions  ? ?Your physician has requested you wear a ZIO patch monitor for 3 days.  ?This is a single patch monitor.   [employer] supplies one patch monitor per enrollment. Additional stickers are not available. Please do not apply patch if you will be having a Nuclear Stress Test, Echocardiogram, Cardiac CT, MRI, or Chest Xray during the period you would be wearing the monitor. The patch cannot be worn during these tests. You cannot remove and re-apply the ZIO XT patch monitor.  ?Your ZIO patch monitor will be sent Fed Ex from [employer] directly to your home address. It may take 3-5 days to receive your monitor after you have been enrolled.  ?Once you have received your monitor, please review the enclosed instructions. Your monitor has already been registered assigning a specific monitor serial # to you. ? ?Billing and Patient Assistance Program Information  ? ?We have supplied [employer] with any of your insurance information on  file for billing purposes. ?[employer] offers a sliding scale Patient Assistance Program for patients that do not have insurance, or whose insurance does not completely cover the cost of the ZIO monitor.   You must apply for the Patient Assistance Program to qualify for this discounted rate.     To apply, please call [employer] at [phone], select option 4, then select option 2, and ask to apply for Patient Assistance Program.  Theodore Demark will ask your household income, and how many people are in your household.  They will quote your out-of-pocket cost based on that information.  [employer] will also be able to set up a 23-month interest-free payment plan if needed. ? ?Applying the monitor  ? ?Shave hair from upper left chest.  ?Hold abrader disc by orange tab. Rub abrader in 40 strokes over the upper left chest as indicated in your monitor instructions.  ?Clean area with 4 enclosed alcohol pads. Let dry.  ?Apply patch as indicated in monitor instructions. Patch will be placed under collarbone on left side of chest with arrow pointing upward.  ?Rub patch adhesive wings for 2 minutes. Remove white label marked "1". Remove the white label marked "2". Rub patch adhesive wings for 2 additional minutes.  ?While looking in a mirror, press and release button in center of patch. A small green light will flash 3-4 times. This will be your only indicator that the monitor has been turned on. ?  ?Do not shower for the first 24 hours. You may shower after the first 24 hours.  ?  Press the button if you feel a symptom. You will hear a small click. Record Date, Time and Symptom in the Patient Logbook.  ?When you are ready to remove the patch, follow instructions on the last 2 pages of the Patient Logbook. Stick patch monitor onto the last page of Patient Logbook.  ?Place Patient Logbook in the blue and white box.  Use locking tab on box and tape box closed securely.  The blue and white box has prepaid postage on it. Please place it in the  mailbox as soon as possible. Your physician should have your test results approximately 7 days after the monitor has been mailed back to [facility].  ?Call [facility] at [phone] if you have questions regarding your ZIO XT patch monitor. Call them immediately if you see an orange light blinking on your monitor.  ?If your monitor falls off in less than 4 days, contact our Monitor department at [phone]. ?If your monitor becomes loose or falls off after 4 days call [employer] at [phone] for suggestions on securing your monitor.? ? ?Follow-Up: ?At [facility], you and your health needs are our priority.  As part of our continuing mission to provide you with exceptional heart care, we have created designated Provider Care Teams.  These Care Teams include your primary Cardiologist (physician) and Advanced Practice Providers (APPs -  Physician Assistants and Nurse Practitioners) who all work together to provide you with the care you need, when you need it. ? ?We recommend signing up for the patient portal called "MyChart".  Sign up information is provided on this After Visit Summary.  MyChart is used to connect with patients for Virtual Visits (Telemedicine).  Patients are able to view lab/test results, encounter notes, upcoming appointments, etc.  Non-urgent messages can be sent to your provider as well.   ?To learn more about what you can do with MyChart, go to [email or web address].   ? ?Your next appointment:   ?As scheduled with Dr. Gardiner Rhyme ? ? ?Important Information About Sugar ? ? ? ? ? ? ?

## 2021-09-25 LAB — COMPREHENSIVE METABOLIC PANEL
ALT: 27 IU/L (ref 0–44)
AST: 21 IU/L (ref 0–40)
Albumin/Globulin Ratio: 1.9 (ref 1.2–2.2)
Albumin: 4.6 g/dL (ref 3.8–4.8)
Alkaline Phosphatase: 71 IU/L (ref 44–121)
BUN/Creatinine Ratio: 10 (ref 10–24)
BUN: 13 mg/dL (ref 8–27)
Bilirubin Total: 0.8 mg/dL (ref 0.0–1.2)
CO2: 28 mmol/L (ref 20–29)
Calcium: 9.5 mg/dL (ref 8.6–10.2)
Chloride: 99 mmol/L (ref 96–106)
Creatinine, Ser: 1.24 mg/dL (ref 0.76–1.27)
Globulin, Total: 2.4 g/dL (ref 1.5–4.5)
Glucose: 100 mg/dL — ABNORMAL HIGH (ref 70–99)
Potassium: 3.5 mmol/L (ref 3.5–5.2)
Sodium: 141 mmol/L (ref 134–144)
Total Protein: 7 g/dL (ref 6.0–8.5)
eGFR: 65 mL/min/{1.73_m2} (ref >59)

## 2021-09-25 LAB — CBC
Hematocrit: 43.2 % (ref 37.5–51.0)
Hemoglobin: 15.3 g/dL (ref 13.0–17.7)
MCH: 31.7 pg (ref 26.6–33.0)
MCHC: 35.4 g/dL (ref 31.5–35.7)
MCV: 89 fL (ref 79–97)
Platelets: 380 10*3/uL (ref 150–450)
RBC: 4.83 x10E6/uL (ref 4.14–5.80)
RDW: 12.1 % (ref 11.6–15.4)
WBC: 8.4 10*3/uL (ref 3.4–10.8)

## 2021-09-25 LAB — BRAIN NATRIURETIC PEPTIDE: BNP: 15.1 pg/mL (ref 0.0–100.0)

## 2021-09-27 DIAGNOSIS — I493 Ventricular premature depolarization: Secondary | ICD-10-CM

## 2021-10-01 ENCOUNTER — Other Ambulatory Visit: Payer: Self-pay

## 2021-10-04 ENCOUNTER — Other Ambulatory Visit: Payer: Self-pay

## 2021-10-06 DIAGNOSIS — G4733 Obstructive sleep apnea (adult) (pediatric): Secondary | ICD-10-CM | POA: Diagnosis not present

## 2021-10-06 DIAGNOSIS — I272 Pulmonary hypertension, unspecified: Secondary | ICD-10-CM | POA: Diagnosis not present

## 2021-10-06 DIAGNOSIS — J4550 Severe persistent asthma, uncomplicated: Secondary | ICD-10-CM | POA: Diagnosis not present

## 2021-10-07 ENCOUNTER — Other Ambulatory Visit: Payer: Self-pay

## 2021-10-07 DIAGNOSIS — I493 Ventricular premature depolarization: Secondary | ICD-10-CM | POA: Diagnosis not present

## 2021-10-08 MED ORDER — TRULICITY 3 MG/0.5ML ~~LOC~~ SOAJ: 3.0000 mg | SUBCUTANEOUS | 1 refills | Status: DC

## 2021-10-08 NOTE — Addendum Note (Signed)
Addended by: Rollen Sox on: 10/08/2021 09:01 AM   Modules accepted: Orders

## 2021-10-14 ENCOUNTER — Other Ambulatory Visit: Payer: Self-pay

## 2021-10-16 ENCOUNTER — Other Ambulatory Visit (HOSPITAL_COMMUNITY): Payer: Self-pay

## 2021-10-18 ENCOUNTER — Encounter: Payer: Self-pay | Admitting: Internal Medicine

## 2021-10-18 ENCOUNTER — Other Ambulatory Visit: Payer: Self-pay

## 2021-10-25 ENCOUNTER — Other Ambulatory Visit: Payer: Self-pay

## 2021-10-28 ENCOUNTER — Telehealth: Payer: Self-pay | Admitting: Cardiology

## 2021-10-28 MED ORDER — DILTIAZEM HCL ER COATED BEADS 180 MG PO CP24: 180.0000 mg | ORAL_CAPSULE | Freq: Every day | ORAL | 3 refills | Status: DC

## 2021-10-28 NOTE — Telephone Encounter (Signed)
Patient is calling stating he is returning a call he received yesterday from [facility] after 5:00. Please advise.

## 2021-10-28 NOTE — Telephone Encounter (Addendum)
Preliminary reviewed monitor findings with pt, aware Dr. Curt Bears reviewed and recommended starting Diltiazem 180 mg daily.   Pt reports low BPs ranging 100-118/62-82 Informed pt that I would discuss adding this medication w/ Camnitz and pharmD further.  Pt is currently taking Amlodipine, HCTZ, Nebivolol and Valsartan. Aware I will discuss f/u w/ Dr. Curt Bears and let pt know via mychart. Dr. Curt Bears stop Amlodipine and start Diltiazem??  Informed that I would forward message to Dr. Newman Nickels nurse to ensure MD has reviewed monitor.  Aware nurse will let him know MDs interpretation.  Patient verbalized understanding and agreeable to plan.

## 2021-10-29 NOTE — Telephone Encounter (Signed)
Patient is calling requesting a callback from Sherri to discuss this.

## 2021-11-01 ENCOUNTER — Other Ambulatory Visit: Payer: Self-pay

## 2021-11-01 ENCOUNTER — Other Ambulatory Visit: Payer: Self-pay | Admitting: Internal Medicine

## 2021-11-01 MED ORDER — METFORMIN HCL ER 500 MG PO TB24
ORAL_TABLET | Freq: Every day | ORAL | 0 refills | Status: DC
  Filled 2021-11-01: qty 30, 30d supply, fill #0

## 2021-11-02 ENCOUNTER — Other Ambulatory Visit: Payer: Self-pay

## 2021-11-08 ENCOUNTER — Other Ambulatory Visit: Payer: Self-pay | Admitting: Cardiology

## 2021-11-08 ENCOUNTER — Other Ambulatory Visit: Payer: Self-pay

## 2021-11-08 ENCOUNTER — Ambulatory Visit: Payer: 59 | Admitting: Cardiology

## 2021-11-08 ENCOUNTER — Other Ambulatory Visit: Payer: Self-pay | Admitting: Internal Medicine

## 2021-11-08 DIAGNOSIS — E1169 Type 2 diabetes mellitus with other specified complication: Secondary | ICD-10-CM

## 2021-11-09 ENCOUNTER — Other Ambulatory Visit: Payer: Self-pay

## 2021-11-09 MED ORDER — ROSUVASTATIN CALCIUM 40 MG PO TABS
40.0000 mg | ORAL_TABLET | Freq: Every day | ORAL | 3 refills | Status: DC
  Filled 2021-11-09: qty 30, 30d supply, fill #0
  Filled 2021-12-11: qty 30, 30d supply, fill #1
  Filled 2022-01-10: qty 30, 30d supply, fill #2

## 2021-11-09 MED ORDER — MONTELUKAST SODIUM 10 MG PO TABS
10.0000 mg | ORAL_TABLET | Freq: Every day | ORAL | 0 refills | Status: DC
Start: 2021-11-09 — End: 2021-12-02
  Filled 2021-11-09: qty 30, 30d supply, fill #0

## 2021-11-09 MED ORDER — EZETIMIBE 10 MG PO TABS
10.0000 mg | ORAL_TABLET | Freq: Every day | ORAL | 3 refills | Status: DC
  Filled 2021-11-09: qty 30, 30d supply, fill #0
  Filled 2021-11-18: qty 90, 90d supply, fill #0
  Filled 2022-02-16 – 2022-06-17 (×2): qty 90, 90d supply, fill #1
  Filled 2022-10-04: qty 90, 90d supply, fill #2

## 2021-11-10 ENCOUNTER — Other Ambulatory Visit: Payer: Self-pay

## 2021-11-12 NOTE — Telephone Encounter (Addendum)
Pt reports HRs 145-153, before morning medications. Then come down to 70-90s, by noon they are low 100s. Pt still having SOB, even at rest, example given that he is sob after climbing stairs this concerns him. Aware I will further discuss w/ MD and let him know recommendation next week. Pt has not been taking BPs..... asked pt to start taking and send me report by Monday as this will/can help determine medication dosing. Patient verbalized understanding and agreeable to plan.

## 2021-11-13 ENCOUNTER — Encounter: Payer: Self-pay | Admitting: Cardiology

## 2021-11-15 NOTE — Telephone Encounter (Signed)
Left message to call back   (When pt calls returns call ok to switch back, per Camnitz, if pt wishes.  Refills will need to come from ordering provider)

## 2021-11-18 ENCOUNTER — Other Ambulatory Visit: Payer: Self-pay

## 2021-11-18 ENCOUNTER — Telehealth: Payer: Self-pay | Admitting: Cardiology

## 2021-11-18 ENCOUNTER — Encounter: Payer: Self-pay | Admitting: Cardiology

## 2021-11-18 MED ORDER — AMLODIPINE BESYLATE 5 MG PO TABS
5.0000 mg | ORAL_TABLET | Freq: Two times a day (BID) | ORAL | 1 refills | Status: DC
  Filled 2021-11-18: qty 180, 90d supply, fill #0
  Filled 2022-02-16: qty 180, 90d supply, fill #1

## 2021-11-18 NOTE — Telephone Encounter (Signed)
Spoke to patient , informed amlodipine 5 mg twice a day  was sent  the pharmacy _ [address]  ave.

## 2021-11-18 NOTE — Telephone Encounter (Signed)
Follow Up:      Patient is retuning Joseph Harvey's call from today.

## 2021-11-18 NOTE — Telephone Encounter (Signed)
Called pt advised of Sherri reply to pt request.  "Left message to call back     (When pt calls returns call ok to switch back, per Camnitz, if pt wishes.  Refills will need to come from ordering provider)"  Advised pt to reach out to original ordering provider.  Pt request Sherri f/u will send message to be reviewed upon return.

## 2021-11-22 NOTE — Telephone Encounter (Signed)
Followed up with pt, verified pt was instructed on the change approval by Dr. Curt Bears. Pt confirms he did make the switch and feels better. He appreciates my follow up call.

## 2021-11-27 ENCOUNTER — Other Ambulatory Visit: Payer: Self-pay | Admitting: Internal Medicine

## 2021-11-29 ENCOUNTER — Other Ambulatory Visit: Payer: Self-pay

## 2021-11-29 ENCOUNTER — Encounter: Payer: Self-pay | Admitting: Internal Medicine

## 2021-11-29 ENCOUNTER — Telehealth: Payer: Self-pay | Admitting: Cardiology

## 2021-11-29 ENCOUNTER — Other Ambulatory Visit: Payer: Self-pay | Admitting: Pharmacist

## 2021-11-29 DIAGNOSIS — M5431 Sciatica, right side: Secondary | ICD-10-CM | POA: Diagnosis not present

## 2021-11-29 NOTE — Telephone Encounter (Signed)
Will forward to the monitor tech.

## 2021-11-29 NOTE — Telephone Encounter (Signed)
Patient stated he received a letter about providing insurance for heart monitor usage.  Patient stated he sent back the monitor in June.  Please advise.

## 2021-11-29 NOTE — Chronic Care Management (AMB) (Signed)
Patient seen by Park Liter, PharmD Candidate on 11/29/21 while they were picking up prescriptions at [place] [facility]. Pt had coffee and cigarettes within the last hour.   Blood pressure today was : 142/89, HR 94; Pt was in a hurry and not able to recheck his blood pressure.   Patient has an automated home blood pressure machine, but is not currently checking at home. They do not report home readings today.   Medication review was performed. They are taking medications as prescribed.   The following barriers to adherence were noted:  - Denies concerns with medication access or understanding.   The following interventions were completed:  - Medications were reviewed  - Patient was educated on proper technique to check home blood pressure and reminded to bring home machine and readings to next provider appointment  - Provided PCP phone number and encouraged pt to schedule an appt.   The patient does not have follow up scheduled.  PCP: Dr. Ronaldo Miyamoto, PharmD, [facility]

## 2021-11-30 ENCOUNTER — Telehealth: Payer: Self-pay | Admitting: Emergency Medicine

## 2021-11-30 NOTE — Telephone Encounter (Signed)
Requested medications are due for refill today.  yes  Requested medications are on the active medications list.  yes  Last refill. 10/22/2021 #30   Future visit scheduled.   yes  Notes to clinic.  Pt has scheduled upcoming appt. Pt already given courtesy refill.    Requested Prescriptions  Pending Prescriptions Disp Refills   metFORMIN (GLUCOPHAGE-XR) 500 MG 24 hr tablet 30 tablet 0    Sig: TAKE 1 TABLET BY MOUTH DAILY. (Must have office visit for refills)     Endocrinology:  Diabetes - Biguanides Failed - 11/27/2021  7:55 PM      Failed - B12 Level in normal range and within 720 days    No results found for: "VITAMINB12"       Failed - CBC within normal limits and completed in the last 12 months    WBC  Date Value Ref Range Status  09/24/2021 8.4 3.4 - 10.8 x10E3/uL Final  02/26/2020 8.7 4.0 - 10.5 K/uL Final   RBC  Date Value Ref Range Status  09/24/2021 4.83 4.14 - 5.80 x10E6/uL Final  02/26/2020 4.99 4.22 - 5.81 Mil/uL Final   Hemoglobin  Date Value Ref Range Status  09/24/2021 15.3 13.0 - 17.7 g/dL Final   Hematocrit  Date Value Ref Range Status  09/24/2021 43.2 37.5 - 51.0 % Final   MCHC  Date Value Ref Range Status  09/24/2021 35.4 31.5 - 35.7 g/dL Final  02/26/2020 35.0 30.0 - 36.0 g/dL Final   [facility]  Date Value Ref Range Status  09/24/2021 31.7 26.6 - 33.0 pg Final  06/20/2018 31.3 26.0 - 34.0 pg Final   MCV  Date Value Ref Range Status  09/24/2021 89 79 - 97 fL Final   No results found for: "PLTCOUNTKUC", "LABPLAT", "POCPLA" RDW  Date Value Ref Range Status  09/24/2021 12.1 11.6 - 15.4 % Final         Passed - Cr in normal range and within 360 days    Creatinine, Ser  Date Value Ref Range Status  09/24/2021 1.24 0.76 - 1.27 mg/dL Final         Passed - HBA1C is between 0 and 7.9 and within 180 days    HbA1c, POC (prediabetic range)  Date Value Ref Range Status  07/19/2018 6.0 5.7 - 6.4 % Final   HbA1c, POC (controlled diabetic range)   Date Value Ref Range Status  02/04/2021 6.6 0.0 - 7.0 % Final   Hgb A1c MFr Bld  Date Value Ref Range Status  06/15/2021 7.3 (H) 4.8 - 5.6 % Final    Comment:             Prediabetes: 5.7 - 6.4          Diabetes: >6.4          Glycemic control for adults with diabetes: <7.0          Passed - eGFR in normal range and within 360 days    GFR calc Af Amer  Date Value Ref Range Status  04/14/2020 68 >59 mL/min/1.73 Final    Comment:    **In accordance with recommendations from the NKF-ASN Task force,**   [facility] is in the process of updating its eGFR calculation to the   2021 CKD-EPI creatinine equation that estimates kidney function   without a race variable.    GFR calc non Af Amer  Date Value Ref Range Status  04/14/2020 59 (L) >59 mL/min/1.73 Final   eGFR  Date Value Ref  Range Status  09/24/2021 65 >59 mL/min/1.73 Final         Passed - Valid encounter within last 6 months    Recent Outpatient Visits           5 months ago Severe persistent asthma with allergic rhinitis without complication   [place] Karle Plumber B, MD   5 months ago Severe persistent asthma without complication   [place] Ladell Pier, [place]   6 months ago Sinusitis, unspecified chronicity, unspecified location   [place], Levada Dy M, [place]   9 months ago Hypertension associated with type 2 diabetes mellitus [facility])   [place], [place]   1 year ago Hypertension associated with type 2 diabetes mellitus [facility])   [place], Deborah B, MD       Future Appointments             In 2 months Donato Heinz, MD [facility], [facility]   In 3 months Ladell Pier, MD [place]

## 2021-11-30 NOTE — Telephone Encounter (Signed)
Copied from [place]. Topic: Appointment Scheduling - Scheduling Inquiry for Clinic >> Nov 30, 2021  8:07 AM Chapman Fitch wrote: Reason for CRM: Pt would like to get orders for blood work orders from Dr. Wynetta Emery / please advise

## 2021-12-01 ENCOUNTER — Other Ambulatory Visit: Payer: Self-pay | Admitting: *Deleted

## 2021-12-01 ENCOUNTER — Other Ambulatory Visit: Payer: Self-pay

## 2021-12-01 DIAGNOSIS — Z122 Encounter for screening for malignant neoplasm of respiratory organs: Secondary | ICD-10-CM

## 2021-12-01 DIAGNOSIS — F17210 Nicotine dependence, cigarettes, uncomplicated: Secondary | ICD-10-CM

## 2021-12-01 MED ORDER — METFORMIN HCL ER 500 MG PO TB24
ORAL_TABLET | Freq: Every day | ORAL | 2 refills | Status: DC
  Filled 2021-12-01: qty 30, 30d supply, fill #0
  Filled 2021-12-26: qty 30, 30d supply, fill #1
  Filled 2022-01-21: qty 30, 30d supply, fill #2

## 2021-12-01 NOTE — Telephone Encounter (Signed)
Patient received letter from [employer].  At the time his monitor was ordered in May of 2023, his insurance was [employer] ([place]).  I advised patient to contact [employer] directly regarding letter.  I will contact our [employer] representative regarding why patient was sent a letter since all that information should have been available to [facility] via electronic medical record integration.  I will sent a My Chart message to Joseph Harvey if he no longer needs to contact [employer].

## 2021-12-02 ENCOUNTER — Other Ambulatory Visit: Payer: Self-pay

## 2021-12-02 ENCOUNTER — Other Ambulatory Visit: Payer: Self-pay | Admitting: Internal Medicine

## 2021-12-02 DIAGNOSIS — E1159 Type 2 diabetes mellitus with other circulatory complications: Secondary | ICD-10-CM

## 2021-12-02 MED ORDER — MONTELUKAST SODIUM 10 MG PO TABS
10.0000 mg | ORAL_TABLET | Freq: Every day | ORAL | 0 refills | Status: DC
  Filled 2021-12-02: qty 30, 30d supply, fill #0

## 2021-12-02 MED ORDER — NEBIVOLOL HCL 20 MG PO TABS
20.0000 mg | ORAL_TABLET | Freq: Two times a day (BID) | ORAL | 0 refills | Status: DC
  Filled 2021-12-02 (×2): qty 60, 30d supply, fill #0

## 2021-12-03 ENCOUNTER — Other Ambulatory Visit: Payer: Self-pay

## 2021-12-03 DIAGNOSIS — I272 Pulmonary hypertension, unspecified: Secondary | ICD-10-CM | POA: Diagnosis not present

## 2021-12-03 DIAGNOSIS — J4550 Severe persistent asthma, uncomplicated: Secondary | ICD-10-CM | POA: Diagnosis not present

## 2021-12-03 DIAGNOSIS — G4733 Obstructive sleep apnea (adult) (pediatric): Secondary | ICD-10-CM | POA: Diagnosis not present

## 2021-12-06 ENCOUNTER — Other Ambulatory Visit: Payer: Self-pay

## 2021-12-06 ENCOUNTER — Telehealth: Payer: Self-pay

## 2021-12-06 DIAGNOSIS — J4550 Severe persistent asthma, uncomplicated: Secondary | ICD-10-CM

## 2021-12-06 NOTE — Telephone Encounter (Signed)
Patient came in stating he need a referral his back and he is also needing a new referral for pulmonology Dr.Hunsucker

## 2021-12-06 NOTE — Addendum Note (Signed)
Addended by: Karle Plumber B on: 12/06/2021 01:46 PM   Modules accepted: Orders

## 2021-12-06 NOTE — Telephone Encounter (Signed)
Called pt and he is aware of dr note

## 2021-12-08 ENCOUNTER — Ambulatory Visit (INDEPENDENT_AMBULATORY_CARE_PROVIDER_SITE_OTHER): Payer: No Typology Code available for payment source | Admitting: Pulmonary Disease

## 2021-12-08 ENCOUNTER — Encounter: Payer: Self-pay | Admitting: Pulmonary Disease

## 2021-12-08 VITALS — BP 128/66 | HR 101 | Wt 241.0 lb

## 2021-12-08 DIAGNOSIS — R0609 Other forms of dyspnea: Secondary | ICD-10-CM | POA: Diagnosis not present

## 2021-12-08 DIAGNOSIS — J301 Allergic rhinitis due to pollen: Secondary | ICD-10-CM | POA: Diagnosis not present

## 2021-12-08 DIAGNOSIS — J4550 Severe persistent asthma, uncomplicated: Secondary | ICD-10-CM | POA: Diagnosis not present

## 2021-12-08 NOTE — Progress Notes (Signed)
Patient ID: Joseph Harvey, male    DOB: [birth year], 66 y.o.   MRN: [id number]  Chief Complaint  Patient presents with   Follow-up    Pt is here for follow up for severe asthma. Pt states the he has a few flare ups here and there. Pt states that he had ablation surgery. I shaving issues with blood pressure. Pt is taking Breztri daily and albuterol as needed. Pt states that he is supposed to be on prednisone but he has stopped to the weight gain.     Referring provider: Ladell Pier, MD  HPI:   Joseph Harvey is a 66 y.o. man whom we are seeing in follow-up for dyspnea exertion, asthma.  Most recent cardiology note reviewed.  Discharge summary 07/2021 following PVC ablation reviewed.  Overall, symptom burden of dyspnea markedly improved since PVC ablation 07/2021.  Reduced frequency of use of albuterol.  He reports good adherence of rescue.  Feels like this does help.  He reports recently stopping diltiazem after discussing with his EP doctor.  It was make him feel very short of breath.  He is back taking amlodipine for blood pressure.  The more recent shortness of breath symptoms seem to be gradually improving.  HPI at initial visit: Patient formerly seen by Joseph Harvey last in 2016.  At last visit symptoms are well controlled with Symbicort.  Unclear exactly what occurred in the last 5 years.  But over the last several months he endorses worsening dyspnea on exertion.  Just walking around going to the gym he has become short of breath.  Endorses severe shortness of breath.  This is resolved within a minute or 2 of albuterol administration.  In general his cough is much better than prior.  Suspect he is adhered well to [place] instructions regarding his airway cough syndrome.  He does feel like he has significant nasal congestion and postnasal drip.  This produces mucus in the back of her throat needs to clear her cough up but feels much different than his prior cough.  Scheduled to have sinus  surgery but this was discontinued due to wheezing per his report.  His insurance is changed and now needs to find a new ENT doctor.  When he gets bad bouts of that mucus buildup he will take a dose of prednisone and within a minute, instantly the mucus feels better.  In terms of his dyspnea, rest improves his dyspnea.  There is no other aggravating or alleviating factors.  He has been on Breo for the last several months and says he thinks it helps somewhat with his breathing but is not at the level it was when he was followed by pulmonary prior.  Prior PFTs reviewed and interpreted as suggestive of mild restriction on spirometry, no fixed obstruction, lung volumes revealed TLC of 86% predicted, within normal limits or mildly reduced.  DLCO within normal limits.  Most recent chest x-ray 06/2018 reviewed instructed is clear lungs, current hyperinflation on PA film does not appear hyperinflated on lateral film.  PMH: Anxiety, obesity, diabetes, tobacco abuse, prostate cancer Surgical history: Lumbar back surgery Family History: Joseph Harvey with colon and lung cancer, [place] with lung cancer Social history: Grew up in [place], lived there for 65 years, current smoker, 20+ pack year history   Questionaires / Pulmonary Flowsheets:   ACT:  Asthma Control Test ACT Total Score  03/10/2021  9:51 AM 13  02/26/2020  9:23 AM 9    MMRC:  No data to display          Epworth:      No data to display          Tests:   FENO:  No results found for: "NITRICOXIDE"  PFT:    Latest Ref Rng & Units 05/26/2021    8:14 AM 02/28/2020   10:53 AM  PFT Results  FVC-Pre L 2.31  2.06   FVC-Predicted Pre % 59  52   FVC-Post L 2.28  2.30   FVC-Predicted Post % 59  59   Pre FEV1/FVC % % 73  70   Post FEV1/FCV % % 72  74   FEV1-Pre L 1.69  1.43   FEV1-Predicted Pre % 56  47   FEV1-Post L 1.65  1.70   DLCO uncorrected ml/min/mmHg 19.07  21.37   DLCO UNC% % 71  80   DLCO corrected ml/min/mmHg 19.07   22.15   DLCO COR %Predicted % 71  82   DLVA Predicted % 99  123   Personally reviewed and interpreted as mixed restrictive and obstructive physiology with gas trapping, normal DLCO, repeat in 05/2021 showed no longer bronchodilator response likely consistent with better controlled asthma, spirometry overall stable  WALK:      No data to display          Imaging: Reviewed as per EMR  Lab Results: Personally reviewed, no significant elevation of eosinophils, IgE and RAST panel negative in past CBC    Component Value Date/Time   WBC 8.4 09/24/2021 0934   WBC 8.7 02/26/2020 1007   RBC 4.83 09/24/2021 0934   RBC 4.99 02/26/2020 1007   HGB 15.3 09/24/2021 0934   HCT 43.2 09/24/2021 0934   PLT 380 09/24/2021 0934   MCV 89 09/24/2021 0934   MCH 31.7 09/24/2021 0934   MCH 31.3 06/20/2018 0309   MCHC 35.4 09/24/2021 0934   MCHC 35.0 02/26/2020 1007   RDW 12.1 09/24/2021 0934   LYMPHSABS 3.2 02/26/2020 1007   MONOABS 0.7 02/26/2020 1007   EOSABS 0.1 02/26/2020 1007   BASOSABS 0.2 (H) 02/26/2020 1007    BMET    Component Value Date/Time   NA 141 09/24/2021 0934   K 3.5 09/24/2021 0934   CL 99 09/24/2021 0934   CO2 28 09/24/2021 0934   GLUCOSE 100 (H) 09/24/2021 0934   GLUCOSE 109 (H) 06/20/2018 0309   BUN 13 09/24/2021 0934   CREATININE 1.24 09/24/2021 0934   CALCIUM 9.5 09/24/2021 0934   GFRNONAA 59 (L) 04/14/2020 1036   GFRAA 68 04/14/2020 1036    BNP    Component Value Date/Time   BNP 15.1 09/24/2021 0934   BNP 25.1 07/24/2014 1256    ProBNP    Component Value Date/Time   PROBNP 336 (H) 09/16/2021 1422       Allergies  Allergen Reactions   Amoxicillin Anaphylaxis    Has patient had a PCN reaction causing immediate rash, facial/tongue/throat swelling, SOB or lightheadedness with hypotension: Yes Has patient had a PCN reaction causing severe rash involving mucus membranes or skin necrosis: No Has patient had a PCN reaction that required  hospitalization: Yes Has patient had a PCN reaction occurring within the last 10 years: Yes If all of the above answers are "NO", then may proceed with Cephalosporin use.    Lisinopril Shortness Of Breath   Molds & Smuts Anaphylaxis   Robitussin [Guaifenesin] Shortness Of Breath    wheezing   Azithromycin Other (See Comments)  Immunization History  Administered Date(s) Administered   DTaP 03/16/2012   Influenza Split 02/07/2013, 02/14/2015, 01/21/2016   Influenza Whole 04/15/2012, 02/10/2020   Influenza, Seasonal, Injecte, Preservative Fre 02/07/2013, 02/14/2015, 01/21/2016, 01/27/2017   Influenza,inj,Quad PF,6+ Mos 01/28/2019   Influenza,inj,Quad PF,6-35 Mos 02/08/2021   Influenza-Unspecified 02/07/2013, 02/07/2013, 02/14/2015, 02/14/2015, 01/21/2016, 01/21/2016, 12/22/2016, 12/22/2016, 01/27/2017, 01/27/2017   PFIZER(Purple Top)SARS-COV-2 Vaccination 07/29/2019, 08/19/2019, 01/07/2020, 03/17/2021   Pneumococcal Conjugate-13 03/16/2015   Pneumococcal Polysaccharide-23 03/16/2012, 05/16/2013   Tdap 12/19/2016   Zoster Recombinat (Shingrix) 12/19/2016, 02/27/2017    Past Medical History:  Diagnosis Date   Allergy    Dust, mold, dust mites   Anemia    Asthma    Cancer ([place])    prostate   Cataract    bilateral repair.   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   albuterol (PROVENTIL) (2.5 MG/3ML) 0.083% nebulizer solution Take 3 mLs (2.5 mg total) by nebulization every 4 (four) hours as needed for wheezing or shortness of breath (((PLAN B))).   albuterol (VENTOLIN HFA) 108 (90 Base) MCG/ACT inhaler Inhale 2 puffs into the lungs every 4 hours as needed for shortness of breath or wheezing   amLODipine (NORVASC) 5 MG tablet Take 1 tablet (5 mg total) by mouth in the morning and at bedtime.   ASPIRIN 81 PO Take 81 mg by mouth daily.   baclofen (LIORESAL) 10 MG tablet take 1 tablet by mouth 3 times a day (Patient taking differently: Take 10 mg by mouth 3 (three) times daily as needed (Nerve pain).)   benzonatate (TESSALON) 100 MG capsule Take 1 capsule (100 mg total) by mouth every 8 (eight) hours. (Patient taking differently: Take 100 mg by mouth 2 (two) times daily as needed for cough.)   Blood Glucose Monitoring Suppl (TRUE METRIX METER) w/Device KIT Use as directed   Budeson-Glycopyrrol-Formoterol (BREZTRI AEROSPHERE) 160-9-4.8 MCG/ACT AERO Inhale 2 puffs into the lungs in the morning and at bedtime.   busPIRone (BUSPAR) 10 MG tablet Take 1 tablet (10 mg total) by mouth 3 (three) times daily. (Patient taking differently: Take 10 mg by mouth 3 (three) times daily as needed (anxiety).)   Cholecalciferol (VITAMIN D PO) Take 1 tablet by mouth daily.   ezetimibe (ZETIA) 10 MG tablet Take 1 tablet (10 mg total) by mouth daily.   fluticasone (FLONASE) 50 MCG/ACT nasal spray PLACE 2 SPRAYS INTO BOTH NOSTRILS DAILY.   gabapentin (NEURONTIN) 300 MG capsule take 1 capsule by mouth in the morning, then take 1 at lunch and 2 at bedtime (Patient taking differently: Take 300 mg by mouth 3 (three) times daily as needed (Nerve pain).)   glucose blood (TRUE METRIX BLOOD GLUCOSE  TEST) test strip Use as instructed   hydrochlorothiazide (HYDRODIURIL) 25 MG tablet Take 1 tablet (25 mg total)  by mouth daily.   HYDROcodone bit-homatropine (HYCODAN) 5-1.5 MG/5ML syrup Take 5 mLs by mouth every 6 (six) hours as needed for cough.   ipratropium-albuterol (DUONEB) 0.5-2.5 (3) MG/3ML SOLN USE 1 VIAL VIA NEBULIZER TWICE A DAY (Patient taking differently: 3 mLs daily as needed (Shortness of breath).)   linaclotide (LINZESS) 72 MCG capsule Take 1 capsule (72 mcg total) by mouth daily before breakfast. (Patient taking differently: Take 72 mcg by mouth daily as needed (constipation).)   metFORMIN (GLUCOPHAGE-XR) 500 MG 24 hr tablet TAKE 1 TABLET BY MOUTH DAILY. (Must have office visit for refills)   montelukast (SINGULAIR) 10 MG tablet Take 1 tablet (10 mg total) by mouth at bedtime.   Nebivolol HCl 20 MG TABS Take 1 tablet (20 mg total) by mouth 2 (two) times daily.   pantoprazole (PROTONIX) 40 MG tablet Take 1 tablet (40 mg total) by mouth daily as needed.   polyethylene glycol powder (GLYCOLAX/MIRALAX) 17 GM/SCOOP powder Take 17 g by mouth daily as needed. (Patient taking differently: Take 17 g by mouth daily as needed for mild constipation or moderate constipation.)   potassium chloride SA (KLOR-CON) 20 MEQ tablet Take 20 mEq by mouth daily as needed (When hands cramp up).   predniSONE (DELTASONE) 10 MG tablet Take 30 mg by mouth daily as needed (Shortness of breath).   Respiratory Therapy Supplies (FLUTTER) DEVI Use three times a day after inhaler or nebulizer use   rosuvastatin (CRESTOR) 40 MG tablet Take 1 tablet (40 mg total) by mouth daily.   TRUEplus Lancets 28G MISC Use as directed   valsartan (DIOVAN) 320 MG tablet Take 1 tablet (320 mg total) by mouth daily.   VENTOLIN HFA 108 (90 Base) MCG/ACT inhaler Inhale 2 puffs into the lungs every 4 (four) hours as needed for wheezing or shortness of breath.   [DISCONTINUED] Dulaglutide (TRULICITY) 3 UO/1.5IF SOPN Inject 3 mg as directed once a week.   No facility-administered encounter medications on file as of 12/08/2021.     Review of  Systems  Review of Systems  N/a Physical Exam  BP 128/66 (BP Location: Left Arm, Patient Position: Sitting, Cuff Size: Normal)   Pulse (!) 101   Wt 241 lb (109.3 kg)   SpO2 97%   BMI 35.59 kg/m   Wt Readings from Last 5 Encounters:  12/08/21 241 lb (109.3 kg)  09/24/21 235 lb 3.2 oz (106.7 kg)  09/16/21 239 lb (108.4 kg)  08/24/21 245 lb (111.1 kg)  08/10/21 238 lb (108 kg)    BMI Readings from Last 5 Encounters:  12/08/21 35.59 kg/m  09/24/21 34.73 kg/m  09/16/21 35.29 kg/m  08/24/21 36.18 kg/m  08/10/21 35.15 kg/m     Physical Exam General: Well-appearing, sitting up in exam chair Eyes: EOMI, icterus Neck: No JVP appreciated, neck supple, Respiratory: Clear, no wheeze which is improved from prior cardiovascular: Regular rate, regular rhythm, no murmurs Abdomen: Nondistended, bowel sounds present MSK: No joint effusion, no synovitis Neuro: Normal gait, no weakness Psych: Normal mood, full affect    Assessment & Plan:   Dyspnea on exertion: Suspect multifactorial.  Asthma as a contributor.   Further discussion as below regarding asthma.  Other contributors include suspected anxiety/hyperventilation syndrome, obesity, deconditioning, PVCs.  Symptoms have improved since PVC ablation.  Asthma: He has atopic symptoms. On breztri.  No wheeze today.  No longer has bronchodilator response  on PFTs 05/2021, likely reflective of well-controlled asthma.  Return in about 6 months (around 06/10/2022).   Lanier Clam, MD 12/08/2021

## 2021-12-08 NOTE — Patient Instructions (Signed)
Nice to see you again  No changes to medications, the Judithann Sauger has refills through February 2024  Since you are doing so well, we will follow-up in 6 months, if you need to be seen sooner or symptoms are worsening please do not hesitate to let me know and I will get you in the clinic sooner.  Return to clinic in 6 months or sooner if needed with Dr. Silas Flood

## 2021-12-09 NOTE — Telephone Encounter (Signed)
Called patient for more information: Lab orders/ test that he is requesting.  Left message on voicemail to return call.

## 2021-12-13 ENCOUNTER — Other Ambulatory Visit: Payer: Self-pay

## 2021-12-14 ENCOUNTER — Other Ambulatory Visit: Payer: Self-pay

## 2021-12-15 ENCOUNTER — Encounter: Payer: Self-pay | Admitting: Acute Care

## 2021-12-15 ENCOUNTER — Ambulatory Visit (INDEPENDENT_AMBULATORY_CARE_PROVIDER_SITE_OTHER): Payer: No Typology Code available for payment source | Admitting: Acute Care

## 2021-12-15 ENCOUNTER — Ambulatory Visit
Admission: RE | Admit: 2021-12-15 | Discharge: 2021-12-15 | Disposition: A | Discharge status: Home / Self Care | Payer: No Typology Code available for payment source | Source: Ambulatory Visit | Attending: Acute Care | Admitting: Acute Care

## 2021-12-15 DIAGNOSIS — F17210 Nicotine dependence, cigarettes, uncomplicated: Secondary | ICD-10-CM

## 2021-12-15 DIAGNOSIS — Z122 Encounter for screening for malignant neoplasm of respiratory organs: Secondary | ICD-10-CM

## 2021-12-15 NOTE — Patient Instructions (Signed)
Thank you for participating in the [facility]. It was our pleasure to meet you today. We will call you with the results of your scan within the next few days. Your scan will be assigned a Lung RADS category score by the physicians reading the scans.  This Lung RADS score determines follow up scanning.  See below for description of categories, and follow up screening recommendations. We will be in touch to schedule your follow up screening annually or based on recommendations of our providers. We will fax a copy of your scan results to your Primary Care Physician, or the physician who referred you to the program, to ensure they have the results. Please call the office if you have any questions or concerns regarding your scanning experience or results.  Our office number is [phone]. Please speak with Denise Phelps, RN. , or  Denise Buckner RN, They are  our Lung Cancer Screening RN.'s If They are unavailable when you call, Please leave a message on the voice mail. We will return your call at our earliest convenience.This voice mail is monitored several times a day.  Remember, if your scan is normal, we will scan you annually as long as you continue to meet the criteria for the program. (Age 55-77, Current smoker or smoker who has quit within the last 15 years). If you are a smoker, remember, quitting is the single most powerful action that you can take to decrease your risk of lung cancer and other pulmonary, breathing related problems. We know quitting is hard, and we are here to help.  Please let us know if there is anything we can do to help you meet your goal of quitting. If you are a former smoker, congratulations. We are proud of you! Remain smoke free! Remember you can refer friends or family members through the number above.  We will screen them to make sure they meet criteria for the program. Thank you for helping us take better care of you by  participating in Lung Screening.  You can receive free nicotine replacement therapy ( patches, gum or mints) by calling 1-800-QUIT NOW. Please call so we can get you on the path to becoming  a non-smoker. I know it is hard, but you can do this!  Lung RADS Categories:  Lung RADS 1: no nodules or definitely non-concerning nodules.  Recommendation is for a repeat annual scan in 12 months.  Lung RADS 2:  nodules that are non-concerning in appearance and behavior with a very low likelihood of becoming an active cancer. Recommendation is for a repeat annual scan in 12 months.  Lung RADS 3: nodules that are probably non-concerning , includes nodules with a low likelihood of becoming an active cancer.  Recommendation is for a 6-month repeat screening scan. Often noted after an upper respiratory illness. We will be in touch to make sure you have no questions, and to schedule your 6-month scan.  Lung RADS 4 A: nodules with concerning findings, recommendation is most often for a follow up scan in 3 months or additional testing based on our provider's assessment of the scan. We will be in touch to make sure you have no questions and to schedule the recommended 3 month follow up scan.  Lung RADS 4 B:  indicates findings that are concerning. We will be in touch with you to schedule additional diagnostic testing based on our provider's  assessment of the scan.  Other options for assistance in smoking cessation (   As covered by your insurance benefits)  Hypnosis for smoking cessation  [employer] [phone]  Acupuncture for smoking cessation  [facility] [phone]   

## 2021-12-15 NOTE — Telephone Encounter (Signed)
Patient seems to be somewhat agitated during discussion.  Patient states he was wanting lab test done to check PSA, cholesterol and referral to spine specialist d/t ongoing/chonic back pain.   He was advised he would be able to speak to Dr. Wynetta Emery and discuss health concerns at apt on 12/23/21 at 1410.  Patient somewhat agitated during discussion.

## 2021-12-15 NOTE — Progress Notes (Signed)
Virtual Visit via Telephone Note  I connected with [place] on 03/30/21 at  2:00 PM EST by telephone and verified that I am speaking with the correct person using two identifiers.  Location: Patient: Home Provider: Working from home   I discussed the limitations, risks, security and privacy concerns of performing an evaluation and management service by telephone and the availability of in person appointments. I also discussed with the patient that there may be a patient responsible charge related to this service. The patient expressed understanding and agreed to proceed.  Shared Decision Making Visit Lung Cancer Screening Program [phone])   Eligibility: Age 66 y.o. Pack Years Smoking History Calculation 34 (# packs/per year x # years smoked) Recent History of coughing up blood  no Unexplained weight loss? no ( >Than 15 pounds within the last 6 months ) Prior History Lung / other cancer no (Diagnosis within the last 5 years already requiring surveillance chest CT Scans). Smoking Status Current Smoker Former Smokers: Years since quit: NA  Quit Date: NA  Visit Components: Discussion included one or more decision making aids. yes Discussion included risk/benefits of screening. yes Discussion included potential follow up diagnostic testing for abnormal scans. yes Discussion included meaning and risk of over diagnosis. yes Discussion included meaning and risk of False Positives. yes Discussion included meaning of total radiation exposure. yes  Counseling Included: Importance of adherence to annual lung cancer LDCT screening. yes Impact of comorbidities on ability to participate in the program. yes Ability and willingness to under diagnostic treatment. yes  Smoking Cessation Counseling: Current Smokers:  Discussed importance of smoking cessation. yes Information about tobacco cessation classes and interventions provided to patient. yes Patient provided with "ticket" for LDCT  Scan. yes Symptomatic Patient. yes  Counseling(Intermediate counseling: > three minutes) [address]406 Diagnosis Code: Tobacco Use Z72.0 Asymptomatic Patient no  Counseling NA Former Smokers:  Discussed the importance of maintaining cigarette abstinence. yes Diagnosis Code: Personal History of Nicotine Dependence. V40.086 Information about tobacco cessation classes and interventions provided to patient. Yes Patient provided with "ticket" for LDCT Scan. yes Written Order for Lung Cancer Screening with LDCT placed in Epic. Yes (CT Chest Lung Cancer Screening Low Dose W/O CM) [id number] Z12.2-Screening of respiratory organs Z87.891-Personal history of nicotine dependence   I spent 25 minutes of face to face time with him discussing the risks and benefits of lung cancer screening. We viewed a power point together that explained in detail the above noted topics. We took the time to pause the power point at intervals to allow for questions to be asked and answered to ensure understanding. We discussed that he had taken the single most powerful action possible to decrease his risk of developing lung cancer when he quit smoking. I counseled him to remain smoke free, and to contact me if he ever had the desire to smoke again so that I can provide resources and tools to help support the effort to remain smoke free. We discussed the time and location of the scan, and that either  Doroteo Glassman RN or I will call with the results within  24-48 hours of receiving them. He has my card and contact information in the event he needs to speak with me, in addition to a copy of the power point we reviewed as a resource. He verbalized understanding of all of the above and had no further questions upon leaving the office.     I explained to the patient that there has been a high  incidence of coronary artery disease noted on these exams. I explained that this is a non-gated exam therefore degree or severity cannot be determined.  This patient is on statin therapy. I have asked the patient to follow-up with their PCP regarding any incidental finding of coronary artery disease and management with diet or medication as they feel is clinically indicated. The patient verbalized understanding of the above and had no further questions.    I spent 3 minutes counseling on smoking cessation and the health risks of continued tobacco abuse    Whyatt Klinger D. Kenton Kingfisher, NP-C [facility] Personal contact information can be found on Amion  12/15/2021, 9:35 AM

## 2021-12-17 ENCOUNTER — Encounter: Payer: Self-pay | Admitting: Internal Medicine

## 2021-12-17 ENCOUNTER — Ambulatory Visit: Payer: No Typology Code available for payment source | Attending: Internal Medicine | Admitting: Internal Medicine

## 2021-12-17 VITALS — BP 120/74 | HR 86 | Ht 69.0 in | Wt 240.0 lb

## 2021-12-17 DIAGNOSIS — E1159 Type 2 diabetes mellitus with other circulatory complications: Secondary | ICD-10-CM

## 2021-12-17 DIAGNOSIS — I700 Atherosclerosis of aorta: Secondary | ICD-10-CM | POA: Diagnosis not present

## 2021-12-17 DIAGNOSIS — F17200 Nicotine dependence, unspecified, uncomplicated: Secondary | ICD-10-CM

## 2021-12-17 DIAGNOSIS — Z6835 Body mass index (BMI) 35.0-35.9, adult: Secondary | ICD-10-CM | POA: Diagnosis not present

## 2021-12-17 DIAGNOSIS — M5416 Radiculopathy, lumbar region: Secondary | ICD-10-CM

## 2021-12-17 DIAGNOSIS — Z8546 Personal history of malignant neoplasm of prostate: Secondary | ICD-10-CM

## 2021-12-17 DIAGNOSIS — E1169 Type 2 diabetes mellitus with other specified complication: Secondary | ICD-10-CM

## 2021-12-17 DIAGNOSIS — F17210 Nicotine dependence, cigarettes, uncomplicated: Secondary | ICD-10-CM

## 2021-12-17 DIAGNOSIS — T2124XA Burn of second degree of lower back, initial encounter: Secondary | ICD-10-CM

## 2021-12-17 DIAGNOSIS — E669 Obesity, unspecified: Secondary | ICD-10-CM

## 2021-12-17 DIAGNOSIS — R233 Spontaneous ecchymoses: Secondary | ICD-10-CM | POA: Diagnosis not present

## 2021-12-17 DIAGNOSIS — I152 Hypertension secondary to endocrine disorders: Secondary | ICD-10-CM

## 2021-12-17 LAB — GLUCOSE, POCT (MANUAL RESULT ENTRY): POC Glucose: 139 mg/dl — AB (ref 70–99)

## 2021-12-17 MED ORDER — TRAMADOL HCL 50 MG PO TABS: 50.0000 mg | ORAL_TABLET | Freq: Three times a day (TID) | ORAL | 0 refills | Status: DC | PRN

## 2021-12-17 MED ORDER — SILVER SULFADIAZINE 1 % EX CREA: TOPICAL_CREAM | CUTANEOUS | 0 refills | Status: DC

## 2021-12-17 NOTE — Progress Notes (Signed)
Pt request pain specialist.

## 2021-12-17 NOTE — Progress Notes (Signed)
Patient ID: Joseph Harvey, male    DOB: [birth year]  MRN: [id number]  CC: Referral   Subjective: Joseph Harvey is a 66 y.o. male who presents for chronic ds management His concerns today include:  DM Type2, HL, HTN, HCM (followed by cardiologist Dr. 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  Presents today with several concerns. Main concern is lower back pain that has been constant x3 weeks.  Pain radiates down the side of the right thigh.  Had pins-and-needles sensation on the plantar surface of the right foot.  Legs feel weak when he tries to walk up stairs.  No loss of bowel or bladder function. -Worse with standing and squatting.  He has been wearing a brace around the right knee which makes the pain in the leg better.  Better with laying on heating pad.  Sustained a burn to his lower back when he fell asleep on the heating pad a few weeks ago. -Seen at urgent care and was given a shot of the pain medication and some prednisone.  This did not help. 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  He was following up with him once a year but he is not in network for patient's current insurance.  Tried to get in at [place] [facility] but was told that he would need to get his records from previous neurosurgeon first.  Patient is frustrated and feels that he is not getting any help  DM: Results for orders placed or performed in visit on 12/17/21  POCT glucose (manual entry)  Result Value Ref Range   POC Glucose 139 (A) 70 - 99 mg/dl  A1C 7.8  Takes metformin once a day but when on prednisone he takes it 500 mg twice a day.  Checks blood sugars once a week. Overdue for diabetic eye exam.  HTN: Reports compliance  with taking his medications that include HCTZ 25 mg daily, Diovan 320 mg daily, Norvasc 5 mg daily and nebivolol 20 mg twice a day.  Would like referral to urology for follow-up.  History of prostate CA status post prostatectomy.  He has not been seen in over a year.  Passing his urine okay.  Continues to smoke about half a pack a day.  Still trying to quit but finding it difficult.  Had lung cancer screening CT 2 days ago.  This showed some emphysematous changes.  Also showed some nodules that favor benign appearance, some changes suggestive of respiratory bronchiolitis and mild three-vessel coronary artery calcifications with aortic atherosclerosis.  Complains of easy bruising.  Has some bruising and swelling on the inside of the left ankle.  Not sure how it occurred but states he had bumped his left shin against a truck a few days ago.  Also notes enlargement of skin above the right upper eyelid compared to the left.  Present for some time.  Patient Active Problem List   Diagnosis Date Noted   PVC (premature ventricular contraction) 08/10/2021   Pain due to onychomycosis of toenails of both feet 08/21/2020   Panic disorder 11/15/2019   Lipomatosis 07/09/2019   Low back pain 09/25/2018   Lumbar radiculopathy 09/25/2018   Constipation 09/17/2018   History of prostate cancer 09/17/2018   Hyperlipidemia associated with type 2 diabetes mellitus ([place]) 09/17/2018  History of paranasal sinus congestion 07/20/2018   Tobacco dependence 07/20/2018   Controlled type 2 diabetes mellitus without complication, without long-term current use of insulin ([place]) 07/20/2018   Vitamin D deficiency 08/18/2016   Hyperlipidemia LDL goal <100 08/10/2016   Renal insufficiency 07/11/2016   Family history of colon cancer 09/30/2015   GERD (gastroesophageal reflux disease) 09/30/2015   History of colon polyps 09/30/2015   Obesity, morbid ([place]) 03/07/2015   Allergic rhinitis 12/15/2014   Malignant neoplasm of  prostate ([place]) 06/02/2014   Cough 07/25/2012   HBP (high blood pressure) 06/01/2012   Asthma, severe persistent 05/30/2012   Asthma 05/30/2012     Current Outpatient Medications on File Prior to Visit  Medication Sig Dispense Refill   acetaminophen (TYLENOL) 325 MG tablet Take 2 tablets (650 mg total) by mouth every 4 (four) hours as needed for headache or mild pain.     albuterol (PROVENTIL) (2.5 MG/3ML) 0.083% nebulizer solution Take 3 mLs (2.5 mg total) by nebulization every 4 (four) hours as needed for wheezing or shortness of breath (((PLAN B))). 150 mL 6   albuterol (VENTOLIN HFA) 108 (90 Base) MCG/ACT inhaler Inhale 2 puffs into the lungs every 4 hours as needed for shortness of breath or wheezing 18 g 5   amLODipine (NORVASC) 5 MG tablet Take 1 tablet (5 mg total) by mouth in the morning and at bedtime. 180 tablet 1   ASPIRIN 81 PO Take 81 mg by mouth daily.     baclofen (LIORESAL) 10 MG tablet take 1 tablet by mouth 3 times a day (Patient taking differently: Take 10 mg by mouth 3 (three) times daily as needed (Nerve pain).) 270 tablet 3   benzonatate (TESSALON) 100 MG capsule Take 1 capsule (100 mg total) by mouth every 8 (eight) hours. (Patient taking differently: Take 100 mg by mouth 2 (two) times daily as needed for cough.) 21 capsule 0   Blood Glucose Monitoring Suppl (TRUE METRIX METER) w/Device KIT Use as directed 1 kit 0   Budeson-Glycopyrrol-Formoterol (BREZTRI AEROSPHERE) 160-9-4.8 MCG/ACT AERO Inhale 2 puffs into the lungs in the morning and at bedtime. 10.7 g 11   busPIRone (BUSPAR) 10 MG tablet Take 1 tablet (10 mg total) by mouth 3 (three) times daily. (Patient taking differently: Take 10 mg by mouth 3 (three) times daily as needed (anxiety).) 90 tablet 3   Cholecalciferol (VITAMIN D PO) Take 1 tablet by mouth daily.     ezetimibe (ZETIA) 10 MG tablet Take 1 tablet (10 mg total) by mouth daily. 90 tablet 3   fluticasone (FLONASE) 50 MCG/ACT nasal spray PLACE 2 SPRAYS INTO  BOTH NOSTRILS DAILY. 16 g 11   gabapentin (NEURONTIN) 300 MG capsule take 1 capsule by mouth in the morning, then take 1 at lunch and 2 at bedtime (Patient taking differently: Take 300 mg by mouth 3 (three) times daily as needed (Nerve pain).) 360 capsule 3   glucose blood (TRUE METRIX BLOOD GLUCOSE TEST) test strip Use as instructed 100 each 12   hydrochlorothiazide (HYDRODIURIL) 25 MG tablet Take 1 tablet (25 mg total) by mouth daily. 90 tablet 3   HYDROcodone bit-homatropine (HYCODAN) 5-1.5 MG/5ML syrup Take 5 mLs by mouth every 6 (six) hours as needed for cough. 75 mL 0   ipratropium-albuterol (DUONEB) 0.5-2.5 (3) MG/3ML SOLN USE 1 VIAL VIA NEBULIZER TWICE A DAY (Patient taking differently: 3 mLs daily as needed (Shortness of breath).) 180 mL 6   linaclotide (LINZESS) 72 MCG capsule Take 1 capsule (  72 mcg total) by mouth daily before breakfast. (Patient taking differently: Take 72 mcg by mouth daily as needed (constipation).) 30 capsule 6   metFORMIN (GLUCOPHAGE-XR) 500 MG 24 hr tablet TAKE 1 TABLET BY MOUTH DAILY. (Must have office visit for refills) 30 tablet 2   montelukast (SINGULAIR) 10 MG tablet Take 1 tablet (10 mg total) by mouth at bedtime. 30 tablet 0   Nebivolol HCl 20 MG TABS Take 1 tablet (20 mg total) by mouth 2 (two) times daily. 60 tablet 0   pantoprazole (PROTONIX) 40 MG tablet Take 1 tablet (40 mg total) by mouth daily as needed. 30 tablet 2   potassium chloride SA (KLOR-CON) 20 MEQ tablet Take 20 mEq by mouth daily as needed (When hands cramp up).     predniSONE (DELTASONE) 10 MG tablet Take 30 mg by mouth daily as needed (Shortness of breath).     Respiratory Therapy Supplies (FLUTTER) DEVI Use three times a day after inhaler or nebulizer use 1 each 0   rosuvastatin (CRESTOR) 40 MG tablet Take 1 tablet (40 mg total) by mouth daily. 90 tablet 3   TRUEplus Lancets 28G MISC Use as directed 100 each 4   valsartan (DIOVAN) 320 MG tablet Take 1 tablet (320 mg total) by mouth daily.  90 tablet 3   VENTOLIN HFA 108 (90 Base) MCG/ACT inhaler Inhale 2 puffs into the lungs every 4 (four) hours as needed for wheezing or shortness of breath. 18 g 6   polyethylene glycol powder (GLYCOLAX/MIRALAX) 17 GM/SCOOP powder Take 17 g by mouth daily as needed. (Patient not taking: Reported on 12/17/2021) 3350 g 1   No current facility-administered medications on file prior to visit.    Allergies  Allergen Reactions   Amoxicillin Anaphylaxis    Has patient had a PCN reaction causing immediate rash, facial/tongue/throat swelling, SOB or lightheadedness with hypotension: Yes Has patient had a PCN reaction causing severe rash involving mucus membranes or skin necrosis: No Has patient had a PCN reaction that required hospitalization: Yes Has patient had a PCN reaction occurring within the last 10 years: Yes If all of the above answers are "NO", then may proceed with Cephalosporin use.    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  Other Topics Concern   Not on file  Social History Narrative   Not on file   Social Determinants of Health    Financial Resource Strain:  Not on file  Food Insecurity: Not on file  Transportation Needs: Not on file  Physical Activity: Not on file  Stress: No Stress Concern Present (05/26/2020)   [place]    Feeling of Stress : Only a little  Social Connections: Not on file  Intimate Partner Violence: Not on file    Family History  Problem Relation Age of Onset   Colon cancer Father    Lung cancer Father        was a smoker   Prostate cancer Father    Lung cancer Mother        was a smoker   Asthma Mother    Allergies Brother    Diabetes Maternal Grandmother    Colon polyps Neg Hx    Esophageal cancer Neg Hx    Stomach cancer Neg Hx    Rectal cancer Neg Hx     Past Surgical History:  Procedure Laterality Date   BACK SURGERY  2017   CATARACT EXTRACTION Bilateral    COLONOSCOPY     2013   COLONOSCOPY  10/03/2019   KNEE ARTHROSCOPY  2005   right   PROSTATE SURGERY     PVC ABLATION N/A 08/10/2021   Procedure: PVC ABLATION;  Surgeon: Constance Haw, MD;  Location: [facility];  Service: Cardiovascular;  Laterality: N/A;   ROBOT ASSISTED LAPAROSCOPIC RADICAL PROSTATECTOMY  03/2010    ROS: Review of Systems Negative except as stated above  PHYSICAL EXAM: BP 120/74   Pulse 86   Ht '5\' 9"'  (1.753 m)   Wt 240 lb (108.9 kg)   SpO2 95%   BMI 35.44 kg/m   Wt Readings from Last 3 Encounters:  12/17/21 240 lb (108.9 kg)  12/08/21 241 lb (109.3 kg)  09/24/21 235 lb 3.2 oz (106.7 kg)    Physical Exam General appearance - alert, well appearing, obese older African-American male and in no distress Mental status - normal mood, behavior, speech, dress, motor activity, and thought processes Eyes -noted enlargement of soft tissue above the right upper eyelid compared to the left.  No masses palpated.  No ptosis or protruding of the eyes. Chest - clear to auscultation, no wheezes, rales or rhonchi,  symmetric air entry Heart - normal rate, regular rhythm, normal S1, S2, no murmurs, rubs, clicks or gallops Neurological -power lower extremities 5/5 proximally and distally.  Decreased gross sensation on the right lateral thigh compared to the left.  Gait is stable and walks unassisted. Extremities -no edema in the lower legs. Skin -resolving ecchymosis noted on the inner aspect of the left ankle below the medial malleolus.  Some soft tissue swelling there.  Not tender to touch.  Good range of motion of the ankle joint. Patient has about a 3 x 3 cm burn to the lower back.  Does not appear infected.  Please see picture below.       Latest Ref Rng & Units 09/24/2021    9:34 AM 08/03/2021    7:53 AM 06/15/2021   10:19 AM  CMP  Glucose 70 - 99 mg/dL 100  117    BUN 8 - 27 mg/dL 13  13    Creatinine 0.76 - 1.27 mg/dL 1.24  1.21    Sodium 134 - 144 mmol/L 141  139    Potassium 3.5 - 5.2 mmol/L 3.5  4.1    Chloride 96 - 106 mmol/L 99  96    CO2 20 -  29 mmol/L 28  28    Calcium 8.6 - 10.2 mg/dL 9.5  9.6  10.3   Total Protein 6.0 - 8.5 g/dL 7.0     Total Bilirubin 0.0 - 1.2 mg/dL 0.8     Alkaline Phos 44 - 121 IU/L 71     AST 0 - 40 IU/L 21     ALT 0 - 44 IU/L 27      Lipid Panel     Component Value Date/Time   CHOL 184 03/25/2021 0926   TRIG 472 (H) 03/25/2021 0926   HDL 61 03/25/2021 0926   CHOLHDL 3.0 03/25/2021 0926   LDLCALC 53 03/25/2021 0926    CBC    Component Value Date/Time   WBC 8.4 09/24/2021 0934   WBC 8.7 02/26/2020 1007   RBC 4.83 09/24/2021 0934   RBC 4.99 02/26/2020 1007   HGB 15.3 09/24/2021 0934   HCT 43.2 09/24/2021 0934   PLT 380 09/24/2021 0934   MCV 89 09/24/2021 0934   MCH 31.7 09/24/2021 0934   MCH 31.3 06/20/2018 0309   MCHC 35.4 09/24/2021 0934   MCHC 35.0 02/26/2020 1007   RDW 12.1 09/24/2021 0934   LYMPHSABS 3.2 02/26/2020 1007   MONOABS 0.7 02/26/2020 1007   EOSABS 0.1 02/26/2020 1007   BASOSABS 0.2 (H) 02/26/2020 1007    ASSESSMENT AND  PLAN: 1. Lumbar radiculopathy Patient presenting with lumbar radiculopathy x3 weeks with history of prior lumbar decompression surgery.  We will avoid strenuous activities.  We will try to get him in with Dr. Lorin Mercy but will probably need an updated MRI since he has not had one in several years.  Given a limited supply of tramadol to use as needed for pain.  Advised that the medication can cause some drowsiness and that it is a narcotic medication. - MR Lumbar Spine Wo Contrast; Future - Ambulatory referral to Orthopedic Surgery - traMADol (ULTRAM) 50 MG tablet; Take 1 tablet (50 mg total) by mouth every 8 (eight) hours as needed.  Dispense: 60 tablet; Refill: 0  2. Type 2 diabetes mellitus with obesity (H[facility]) Not at goal.  Recommend taking the metformin 500 mg twice a day consistently, not just when he is on prednisone. -Recommend asking the ophthalmologist what his thoughts are about the soft tissue enlargement above the right upper eyelid. - POCT glucose (manual entry) - POCT glycosylated hemoglobin (Hb A1C) - Ambulatory referral to Ophthalmology  3. Hypertension associated with type 2 diabetes mellitus ([place]) At goal.  Continue current medications listed above  4. Partial thickness burn of lower back, initial encounter Try to avoid falling asleep on heating pad in the future. - silver sulfADIAZINE (SILVADENE) 1 % cream; Apply twice a day to affected area  Dispense: 50 g; Refill: 0  5. Easy bruising - CBC - PT AND PTT  6. History of prostate cancer - Ambulatory referral to Urology  7. Aortic atherosclerosis ([place]) Patient on aspirin and statin.  At negative nuclear stress test in 2021.  Also recommends that he shares the results of the CT scan with his cardiologist Dr. Gardiner Rhyme  8. Tobacco dependence Strongly advised to quit smoking.     Patient was given the opportunity to ask questions.  Patient verbalized understanding of the plan and was able to repeat key elements of the  plan.   This documentation was completed using Radio producer.  Any transcriptional errors are unintentional.  Orders Placed This Encounter  Procedures   POCT glucose (manual entry)  POCT glycosylated hemoglobin (Hb A1C)     Requested Prescriptions    No prescriptions requested or ordered in this encounter    No follow-ups on file.  Karle Plumber, MD, FACP

## 2021-12-18 LAB — CBC
Hematocrit: 40.6 % (ref 37.5–51.0)
Hemoglobin: 14.1 g/dL (ref 13.0–17.7)
MCH: 32 pg (ref 26.6–33.0)
MCHC: 34.7 g/dL (ref 31.5–35.7)
MCV: 92 fL (ref 79–97)
Platelets: 345 10*3/uL (ref 150–450)
RBC: 4.41 x10E6/uL (ref 4.14–5.80)
RDW: 13 % (ref 11.6–15.4)
WBC: 12.2 10*3/uL — ABNORMAL HIGH (ref 3.4–10.8)

## 2021-12-18 LAB — PT AND PTT
INR: 0.9 (ref 0.9–1.2)
Prothrombin Time: 10.3 s (ref 9.1–12.0)
aPTT: 29 s (ref 24–33)

## 2021-12-20 ENCOUNTER — Other Ambulatory Visit: Payer: Self-pay

## 2021-12-20 LAB — POCT GLYCOSYLATED HEMOGLOBIN (HGB A1C): HbA1c, POC (controlled diabetic range): 7.8 % — AB (ref 0.0–7.0)

## 2021-12-21 ENCOUNTER — Other Ambulatory Visit: Payer: Self-pay

## 2021-12-22 ENCOUNTER — Other Ambulatory Visit: Payer: Self-pay | Admitting: Internal Medicine

## 2021-12-22 ENCOUNTER — Other Ambulatory Visit: Payer: Self-pay

## 2021-12-22 ENCOUNTER — Telehealth: Payer: Self-pay | Admitting: Pulmonary Disease

## 2021-12-22 DIAGNOSIS — J4550 Severe persistent asthma, uncomplicated: Secondary | ICD-10-CM

## 2021-12-22 DIAGNOSIS — F17210 Nicotine dependence, cigarettes, uncomplicated: Secondary | ICD-10-CM

## 2021-12-22 MED ORDER — ALBUTEROL SULFATE (2.5 MG/3ML) 0.083% IN NEBU: 2.5000 mg | INHALATION_SOLUTION | RESPIRATORY_TRACT | 6 refills | Status: DC | PRN

## 2021-12-22 MED ORDER — ALPRAZOLAM 1 MG PO TABS
ORAL_TABLET | ORAL | 0 refills | Status: DC
  Filled 2021-12-22: qty 1, 1d supply, fill #0

## 2021-12-22 NOTE — Telephone Encounter (Signed)
Rx for albuterol sol has been sent to preferred pharmacy for pt. Called and spoke with pt letting him know this had been done and he verbalized understanding.   While speaking with pt, he wanted to know the results of his recent CT. Dr. Silas Flood, please advise.

## 2021-12-22 NOTE — Telephone Encounter (Signed)
CT scan showed no suspicious nodule. Recommend repeat CT chest in 12 months and ongoing visits with lung cancer screening. Lungs looked pretty good to me!

## 2021-12-22 NOTE — Telephone Encounter (Signed)
Called patient and went over CT results with patient. Let him know MH recommendations for his CT scans. New CT scan placed for next year. Nothing further needed

## 2021-12-24 ENCOUNTER — Ambulatory Visit
Admission: EM | Admit: 2021-12-24 | Discharge: 2021-12-24 | Disposition: A | Discharge status: Home / Self Care | Payer: No Typology Code available for payment source | Attending: Emergency Medicine | Admitting: Emergency Medicine

## 2021-12-24 ENCOUNTER — Telehealth: Payer: Self-pay

## 2021-12-24 DIAGNOSIS — R42 Dizziness and giddiness: Secondary | ICD-10-CM | POA: Insufficient documentation

## 2021-12-24 DIAGNOSIS — Z20822 Contact with and (suspected) exposure to covid-19: Secondary | ICD-10-CM | POA: Insufficient documentation

## 2021-12-24 DIAGNOSIS — I2510 Atherosclerotic heart disease of native coronary artery without angina pectoris: Secondary | ICD-10-CM | POA: Diagnosis not present

## 2021-12-24 DIAGNOSIS — J441 Chronic obstructive pulmonary disease with (acute) exacerbation: Secondary | ICD-10-CM | POA: Diagnosis not present

## 2021-12-24 DIAGNOSIS — R7981 Abnormal blood-gas level: Secondary | ICD-10-CM | POA: Insufficient documentation

## 2021-12-24 DIAGNOSIS — I422 Other hypertrophic cardiomyopathy: Secondary | ICD-10-CM | POA: Diagnosis not present

## 2021-12-24 DIAGNOSIS — R0602 Shortness of breath: Secondary | ICD-10-CM | POA: Diagnosis not present

## 2021-12-24 DIAGNOSIS — R630 Anorexia: Secondary | ICD-10-CM | POA: Insufficient documentation

## 2021-12-24 DIAGNOSIS — R062 Wheezing: Secondary | ICD-10-CM | POA: Insufficient documentation

## 2021-12-24 DIAGNOSIS — R432 Parageusia: Secondary | ICD-10-CM | POA: Diagnosis not present

## 2021-12-24 LAB — POCT FASTING CBG KUC MANUAL ENTRY: POCT Glucose (KUC): 106 mg/dL — AB (ref 70–99)

## 2021-12-24 MED ORDER — DEXAMETHASONE 6 MG PO TABS
6.0000 mg | ORAL_TABLET | Freq: Two times a day (BID) | ORAL | 0 refills | Status: DC
Start: 2021-12-24 — End: 2021-12-24

## 2021-12-24 MED ORDER — DEXAMETHASONE 6 MG PO TABS: 6.0000 mg | ORAL_TABLET | Freq: Two times a day (BID) | ORAL | 0 refills | Status: AC

## 2021-12-24 MED ORDER — DEXAMETHASONE SODIUM PHOSPHATE 10 MG/ML IJ SOLN
10.0000 mg | Freq: Once | INTRAMUSCULAR | Status: AC
  Administered 2021-12-24: 10 mg via INTRAMUSCULAR

## 2021-12-24 NOTE — Discharge Instructions (Addendum)
You were tested for COVID-19 today.  The result of your COVID-19 test will be posted to your MyChart once it is complete, typically this takes 48-72 hours.    If your COVID-19 test is positive you will be contacted by phone.  Please advise the nurse whether you are interested in taking the recommended antiviral for COVID-19 called Paxlovid.   You received an injection of Decadron today to help resolve the wheezing that I hear in your lungs at this time.  I would like for you to pick up and begin taking dexamethasone 6 mg twice daily, your first dose should be tomorrow morning with your first meal of the day.  Your blood sugar level today was 106.  While you are taking Decadron, please be mindful to avoid all foods high in sugar and carbohydrates, as you are well aware, steroids can raise your blood sugar levels.  Please reach out to your provider first thing Monday morning about getting back on a daily maintenance inhaler such as Trelegy, Breztri.  Please consider taking your Linzess to help with your decreased appetite, it is possible that the tramadol that you are taking for pain has caused your bowels to slow down and you have become a little constipated.  Thank you for visiting urgent care today.

## 2021-12-24 NOTE — ED Triage Notes (Signed)
The patient states he has been feeling dizzy/ lightheaded, sweaty, SOB, states food tastes different and he has no appetite and back pain.   Started: about 3 days ago  Home interventions: ibuprofen '800mg'$ 

## 2021-12-24 NOTE — ED Provider Notes (Signed)
[facility]    CSN: [id number] Arrival date & time: 12/24/21  1711    HISTORY   Chief Complaint  Patient presents with   Dizziness   Shortness of Breath   altered taste   Back Pain   HPI Joseph Harvey is a pleasant, 66 y.o. male who presents to urgent care today. The patient states he has been feeling dizzy/ lightheaded, sweaty, SOB, states food tastes different and he has no appetite and back pain.  Patient is requesting tested for COVID-19.  Patient denies known sick contacts.  Patient denies sore throat, fever, body aches, chills, congestion, rhinorrhea, cough.  Patient states his symptoms began 3 days ago.  Patient states has been taking ibuprofen with no meaningful relief of his pain.  Vital signs are normal on arrival today.  EKG today is unremarkable.  The history is provided by the patient.   Past Medical History:  Diagnosis Date   Allergy    Dust, mold, dust mites   Anemia    Asthma    Cancer ([place])    prostate   Cataract    bilateral repair.   COVID    Diabetes mellitus without complication ([place])    Glaucoma    Hyperlipidemia    Hypertension    Neuromuscular disorder ([place])    nerve damage from back surgery   Pneumonia    Patient Active Problem List   Diagnosis Date Noted   PVC (premature ventricular contraction) 08/10/2021   Pain due to onychomycosis of toenails of both feet 08/21/2020   Panic disorder 11/15/2019   Lipomatosis 07/09/2019   Low back pain 09/25/2018   Lumbar radiculopathy 09/25/2018   Constipation 09/17/2018   History of prostate cancer 09/17/2018   Hyperlipidemia associated with type 2 diabetes mellitus ([place]) 09/17/2018   History of paranasal sinus congestion 07/20/2018   Tobacco dependence 07/20/2018   Controlled type 2 diabetes mellitus without complication, without long-term current use of insulin ([place]) 07/20/2018   Vitamin D deficiency 08/18/2016   Hyperlipidemia LDL goal <100 08/10/2016   Renal insufficiency 07/11/2016    Family history of colon cancer 09/30/2015   GERD (gastroesophageal reflux disease) 09/30/2015   History of colon polyps 09/30/2015   Obesity, morbid ([place]) 03/07/2015   Allergic rhinitis 12/15/2014   Malignant neoplasm of prostate ([place]) 06/02/2014   Cough 07/25/2012   HBP (high blood pressure) 06/01/2012   Asthma, severe persistent 05/30/2012   Asthma 05/30/2012   Past Surgical History:  Procedure Laterality Date   BACK SURGERY  2017   CATARACT EXTRACTION Bilateral    COLONOSCOPY     2013   COLONOSCOPY  10/03/2019   KNEE ARTHROSCOPY  2005   right   PROSTATE SURGERY     PVC ABLATION N/A 08/10/2021   Procedure: PVC ABLATION;  Surgeon: Constance Haw, MD;  Location: [facility];  Service: Cardiovascular;  Laterality: N/A;   ROBOT ASSISTED LAPAROSCOPIC RADICAL PROSTATECTOMY  03/2010    Home Medications    Prior to Admission medications   Medication Sig Start Date End Date Taking? Authorizing Provider  dexamethasone (DECADRON) 6 MG tablet Take 1 tablet (6 mg total) by mouth 2 (two) times daily with a meal for 5 days. 12/24/21 12/29/21 Yes Lynden Oxford Scales, PA-C  acetaminophen (TYLENOL) 325 MG tablet Take 2 tablets (650 mg total) by mouth every 4 (four) hours as needed for headache or mild pain. 08/11/21   Shirley Friar, PA-C  albuterol (PROVENTIL) (2.5 MG/3ML) 0.083% nebulizer solution Take  3 mLs (2.5 mg total) by nebulization every 4 (four) hours as needed for wheezing or shortness of breath (((PLAN B))). 12/22/21   Hunsucker, Bonna Gains, MD  albuterol (VENTOLIN HFA) 108 (90 Base) MCG/ACT inhaler Inhale 2 puffs into the lungs every 4 hours as needed for shortness of breath or wheezing 06/14/21   Hunsucker, Bonna Gains, MD  ALPRAZolam Duanne Moron) 1 MG tablet Take 1 tablet by mouth 1/2 hr before MRI imaging 12/22/21   Ladell Pier, MD  amLODipine (NORVASC) 5 MG tablet Take 1 tablet (5 mg total) by mouth in the morning and at bedtime. 11/18/21   Camnitz, Will Hassell Done, MD   ASPIRIN 81 PO Take 81 mg by mouth daily.    [provider]  baclofen (LIORESAL) 10 MG tablet take 1 tablet by mouth 3 times a day Patient taking differently: Take 10 mg by mouth 3 (three) times daily as needed (Nerve pain). 12/29/20     benzonatate (TESSALON) 100 MG capsule Take 1 capsule (100 mg total) by mouth every 8 (eight) hours. Patient taking differently: Take 100 mg by mouth 2 (two) times daily as needed for cough. 01/24/21   Orpah Greek, MD  Blood Glucose Monitoring Suppl (TRUE METRIX METER) w/Device KIT Use as directed 01/07/19   Ladell Pier, MD  Budeson-Glycopyrrol-Formoterol (BREZTRI AEROSPHERE) 160-9-4.8 MCG/ACT AERO Inhale 2 puffs into the lungs in the morning and at bedtime. 07/06/21   Hunsucker, Bonna Gains, MD  busPIRone (BUSPAR) 10 MG tablet Take 1 tablet (10 mg total) by mouth 3 (three) times daily. Patient taking differently: Take 10 mg by mouth 3 (three) times daily as needed (anxiety). 09/11/20   Ladell Pier, MD  Cholecalciferol (VITAMIN D PO) Take 1 tablet by mouth daily.    [provider]  ezetimibe (ZETIA) 10 MG tablet Take 1 tablet (10 mg total) by mouth daily. 11/09/21 11/04/22  Donato Heinz, MD  fluticasone (FLONASE) 50 MCG/ACT nasal spray PLACE 2 SPRAYS INTO BOTH NOSTRILS DAILY. 02/18/21   Ladell Pier, MD  gabapentin (NEURONTIN) 300 MG capsule take 1 capsule by mouth in the morning, then take 1 at lunch and 2 at bedtime Patient taking differently: Take 300 mg by mouth 3 (three) times daily as needed (Nerve pain). 12/29/20     glucose blood (TRUE METRIX BLOOD GLUCOSE TEST) test strip Use as instructed 01/07/19   Ladell Pier, MD  hydrochlorothiazide (HYDRODIURIL) 25 MG tablet Take 1 tablet (25 mg total) by mouth daily. 03/25/21 03/20/22  Donato Heinz, MD  ipratropium-albuterol (DUONEB) 0.5-2.5 (3) MG/3ML SOLN USE 1 VIAL VIA NEBULIZER TWICE A DAY Patient taking differently: 3 mLs daily as needed  (Shortness of breath). 07/02/21   Ladell Pier, MD  linaclotide [facility]) 72 MCG capsule Take 1 capsule (72 mcg total) by mouth daily before breakfast. Patient taking differently: Take 72 mcg by mouth daily as needed (constipation). 06/03/21   Esterwood, Amy S, PA-C  metFORMIN (GLUCOPHAGE-XR) 500 MG 24 hr tablet TAKE 1 TABLET BY MOUTH DAILY. (Must have office visit for refills) 12/01/21   Ladell Pier, MD  montelukast (SINGULAIR) 10 MG tablet Take 1 tablet (10 mg total) by mouth at bedtime. 12/02/21   Ladell Pier, MD  Nebivolol HCl 20 MG TABS Take 1 tablet (20 mg total) by mouth 2 (two) times daily. 12/02/21   Ladell Pier, MD  pantoprazole (PROTONIX) 40 MG tablet Take 1 tablet (40 mg total) by mouth daily as needed. 07/12/21  Ladell Pier, MD  potassium chloride SA (KLOR-CON) 20 MEQ tablet Take 20 mEq by mouth daily as needed (When hands cramp up). 02/23/17   [provider]  predniSONE (DELTASONE) 10 MG tablet Take 30 mg by mouth daily as needed (Shortness of breath).    [provider]  Respiratory Therapy Supplies (FLUTTER) DEVI Use three times a day after inhaler or nebulizer use 12/10/18   Elsie Stain, MD  rosuvastatin (CRESTOR) 40 MG tablet Take 1 tablet (40 mg total) by mouth daily. 11/09/21   Donato Heinz, MD  silver sulfADIAZINE (SILVADENE) 1 % cream Apply twice a day to affected area 12/17/21   Ladell Pier, MD  traMADol (ULTRAM) 50 MG tablet Take 1 tablet (50 mg total) by mouth every 8 (eight) hours as needed. 12/17/21   Ladell Pier, MD  TRUEplus Lancets 28G MISC Use as directed 01/07/19   Ladell Pier, MD  valsartan (DIOVAN) 320 MG tablet Take 1 tablet (320 mg total) by mouth daily. 09/06/21 09/06/22  Donato Heinz, MD  VENTOLIN HFA 108 (90 Base) MCG/ACT inhaler Inhale 2 puffs into the lungs every 4 (four) hours as needed for wheezing or shortness of breath. 06/14/21   Hunsucker, Bonna Gains, MD    Family  History Family History  Problem Relation Age of Onset   Colon cancer Father    Lung cancer Father        was a smoker   Prostate cancer Father    Lung cancer Mother        was a smoker   Asthma Mother    Allergies Brother    Diabetes Maternal Grandmother    Colon polyps Neg Hx    Esophageal cancer Neg Hx    Stomach cancer Neg Hx    Rectal cancer Neg Hx    Social History Social History   Tobacco Use   Smoking status: Every Day    Packs/day: 0.75    Years: 46.00    Total pack years: 34.50    Types: Cigarettes    Start date: 1976   Smokeless tobacco: Never   Tobacco comments:    still smoking 0.5 ppd  Vaping Use   Vaping Use: Never used  Substance Use Topics   Alcohol use: Not Currently    Alcohol/week: 3.0 standard drinks of alcohol    Types: 3 Cans of beer per week    Comment: rarely   Drug use: No   Allergies   Amoxicillin, Lisinopril, Molds & smuts, Robitussin [guaifenesin], and Azithromycin  Review of Systems Review of Systems Pertinent findings revealed after performing a 14 point review of systems has been noted in the history of present illness.  Physical Exam Triage Vital Signs ED Triage Vitals  Enc Vitals Group     BP 03/12/21 0827 (!) 147/82     Pulse Rate 03/12/21 0827 72     Resp 03/12/21 0827 18     Temp 03/12/21 0827 98.3 F (36.8 C)     Temp Source 03/12/21 0827 Oral     SpO2 03/12/21 0827 98 %     Weight --      Height --      Head Circumference --      Peak Flow --      Pain Score 03/12/21 0826 5     Pain Loc --      Pain Edu? --      Excl. in [place]? --   No data  found.  Updated Vital Signs BP 127/78 (BP Location: Left Arm)   Pulse 85   Temp 98.9 F (37.2 C) (Oral)   Resp 16   SpO2 93%   Physical Exam Vitals and nursing note reviewed.  Constitutional:      General: He is not in acute distress.    Appearance: Normal appearance. He is not ill-appearing.  HENT:     Head: Normocephalic and atraumatic.     Salivary Glands: Right  salivary gland is not diffusely enlarged or tender. Left salivary gland is not diffusely enlarged or tender.     Right Ear: Tympanic membrane, ear canal and external ear normal. No drainage. No middle ear effusion. There is no impacted cerumen. Tympanic membrane is not erythematous or bulging.     Left Ear: Tympanic membrane, ear canal and external ear normal. No drainage.  No middle ear effusion. There is no impacted cerumen. Tympanic membrane is not erythematous or bulging.     Nose: Nose normal. No nasal deformity, septal deviation, mucosal edema, congestion or rhinorrhea.     Right Turbinates: Not enlarged, swollen or pale.     Left Turbinates: Not enlarged, swollen or pale.     Right Sinus: No maxillary sinus tenderness or frontal sinus tenderness.     Left Sinus: No maxillary sinus tenderness or frontal sinus tenderness.     Mouth/Throat:     Lips: Pink. No lesions.     Mouth: Mucous membranes are moist. No oral lesions.     Pharynx: Oropharynx is clear. Uvula midline. No posterior oropharyngeal erythema or uvula swelling.     Tonsils: No tonsillar exudate. 0 on the right. 0 on the left.  Eyes:     General: Lids are normal.        Right eye: No discharge.        Left eye: No discharge.     Extraocular Movements: Extraocular movements intact.     Conjunctiva/sclera: Conjunctivae normal.     Right eye: Right conjunctiva is not injected.     Left eye: Left conjunctiva is not injected.  Neck:     Trachea: Trachea and phonation normal.  Cardiovascular:     Rate and Rhythm: Regular rhythm.     Pulses: Normal pulses.     Heart sounds: Normal heart sounds. No murmur heard.    No friction rub. No gallop.  Pulmonary:     Effort: Pulmonary effort is normal. No tachypnea, bradypnea, accessory muscle usage, prolonged expiration or respiratory distress.     Breath sounds: Normal air entry. No stridor, decreased air movement or transmitted upper airway sounds. Examination of the right-upper  field reveals wheezing. Examination of the left-upper field reveals wheezing. Examination of the right-middle field reveals wheezing. Examination of the left-middle field reveals wheezing. Examination of the right-lower field reveals wheezing. Examination of the left-lower field reveals wheezing. Wheezing present. No decreased breath sounds, rhonchi or rales.  Chest:     Chest wall: No tenderness.  Musculoskeletal:        General: Normal range of motion.     Cervical back: Normal range of motion and neck supple. Normal range of motion.  Lymphadenopathy:     Cervical: No cervical adenopathy.  Skin:    General: Skin is warm and dry.     Findings: No erythema or rash.  Neurological:     General: No focal deficit present.     Mental Status: He is alert and oriented to person, place, and time.  Psychiatric:        Mood and Affect: Mood normal.        Behavior: Behavior normal.     Visual Acuity Right Eye Distance:   Left Eye Distance:   Bilateral Distance:    Right Eye Near:   Left Eye Near:    Bilateral Near:     [facility] / Diagnostics / Procedures:     Radiology No results found.  Procedures Procedures (including critical care time) EKG  Pending results:  Labs Reviewed  POCT FASTING CBG [facility] MANUAL ENTRY - Abnormal; Notable for the following components:      Result Value   POCT Glucose ([facility]) 106 (*)    All other components within normal limits  SARS CORONAVIRUS 2 BY RT PCR    Medications Ordered in UC: Medications  dexamethasone (DECADRON) injection 10 mg (10 mg Intramuscular Given 12/24/21 1844)    UC Diagnoses / Final Clinical Impressions(s)   I have reviewed the triage vital signs and the nursing notes.  Pertinent labs & imaging results that were available during my care of the patient were reviewed by me and considered in my medical decision making (see chart for details).    Final diagnoses:  Shortness of breath  Dizzy  Loss of appetite  Encounter for  laboratory testing for COVID-19 virus  Coronary artery disease involving native heart, unspecified vessel or lesion type, unspecified whether angina present  Low oxygen saturation  Dysgeusia  Hypertrophic cardiomyopathy ([place])  Wheezing  Acute exacerbation of chronic obstructive pulmonary disease (COPD) ([place])   COVID-19 testing performed at patient's request, believe this diagnosis is unlikely however if positive, patient would benefit from Paxlovid, patient has a normal GFR last checked in May of this year.  Patient was provided with an injection of dexamethasone during his visit today for wheezing on exam and provided with an oral dose as well.  Isolation precautions provided.  Return precautions advised.  ED Prescriptions     Medication Sig Dispense Auth. Provider   dexamethasone (DECADRON) 6 MG tablet Take 1 tablet (6 mg total) by mouth 2 (two) times daily with a meal for 5 days. 10 tablet Lynden Oxford Scales, PA-C      PDMP not reviewed this encounter.  Disposition Upon Discharge:  Condition: stable for discharge home Home: take medications as prescribed; routine discharge instructions as discussed; follow up as advised.  Patient presented with an acute illness with associated systemic symptoms and significant discomfort requiring urgent management. In my opinion, this is a condition that a prudent lay person (someone who possesses an average knowledge of health and medicine) may potentially expect to result in complications if not addressed urgently such as respiratory distress, impairment of bodily function or dysfunction of bodily organs.   Routine symptom specific, illness specific and/or disease specific instructions were discussed with the patient and/or caregiver at length.   As such, the patient has been evaluated and assessed, work-up was performed and treatment was provided in alignment with urgent care protocols and evidence based medicine.  Patient/parent/caregiver has  been advised that the patient may require follow up for further testing and treatment if the symptoms continue in spite of treatment, as clinically indicated and appropriate.  If the patient was tested for COVID-19, Influenza and/or RSV, then the patient/parent/guardian was advised to isolate at home pending the results of his/her diagnostic coronavirus test and potentially longer if they're positive. I have also advised pt that if his/her COVID-19 test returns positive, it's recommended  to self-isolate for at least 10 days after symptoms first appeared AND until fever-free for 24 hours without fever reducer AND other symptoms have improved or resolved. Discussed self-isolation recommendations as well as instructions for household member/close contacts as per the [facility] [place] DHHS, and also gave patient the [place] packet with this information.  Patient/parent/caregiver has been advised to return to the [facility] or PCP in 3-5 days if no better; to PCP or the Emergency Department if new signs and symptoms develop, or if the current signs or symptoms continue to change or worsen for further workup, evaluation and treatment as clinically indicated and appropriate  The patient will follow up with their current PCP if and as advised. If the patient does not currently have a PCP we will assist them in obtaining one.   The patient may need specialty follow up if the symptoms continue, in spite of conservative treatment and management, for further workup, evaluation, consultation and treatment as clinically indicated and appropriate.  Patient/parent/caregiver verbalized understanding and agreement of plan as discussed.  All questions were addressed during visit.  Please see discharge instructions below for further details of plan.  Discharge Instructions:   Discharge Instructions      You were tested for COVID-19 today.  The result of your COVID-19 test will be posted to your MyChart once it is complete, typically  this takes 48-72 hours.    If your COVID-19 test is positive you will be contacted by phone.  Please advise the nurse whether you are interested in taking the recommended antiviral for COVID-19 called Paxlovid.   You received an injection of Decadron today to help resolve the wheezing that I hear in your lungs at this time.  I would like for you to pick up and begin taking dexamethasone 6 mg twice daily, your first dose should be tomorrow morning with your first meal of the day.  Your blood sugar level today was 106.  While you are taking Decadron, please be mindful to avoid all foods high in sugar and carbohydrates, as you are well aware, steroids can raise your blood sugar levels.  Please reach out to your provider first thing Monday morning about getting back on a daily maintenance inhaler such as Trelegy, Breztri.  Please consider taking your Linzess to help with your decreased appetite, it is possible that the tramadol that you are taking for pain has caused your bowels to slow down and you have become a little constipated.  Thank you for visiting urgent care today.    This office note has been dictated using Museum/gallery curator.  Unfortunately, this method of dictation can sometimes lead to typographical or grammatical errors.  I apologize for your inconvenience in advance if this occurs.  Please do not hesitate to reach out to me if clarification is needed.      Lynden Oxford Scales, PA-C 12/25/21 1526

## 2021-12-25 ENCOUNTER — Other Ambulatory Visit: Payer: Self-pay | Admitting: Internal Medicine

## 2021-12-25 LAB — SARS CORONAVIRUS 2 BY RT PCR: SARS Coronavirus 2 by RT PCR: NEGATIVE

## 2021-12-26 ENCOUNTER — Other Ambulatory Visit: Payer: Self-pay | Admitting: Internal Medicine

## 2021-12-26 MED ORDER — POTASSIUM CHLORIDE CRYS ER 20 MEQ PO TBCR
20.0000 meq | EXTENDED_RELEASE_TABLET | Freq: Every day | ORAL | 2 refills | Status: DC | PRN
  Filled 2021-12-26: qty 60, 60d supply, fill #0
  Filled 2022-07-02: qty 60, 60d supply, fill #1
  Filled 2022-10-04: qty 60, 60d supply, fill #2

## 2021-12-27 ENCOUNTER — Other Ambulatory Visit: Payer: Self-pay

## 2021-12-27 ENCOUNTER — Encounter: Payer: Self-pay | Admitting: Cardiology

## 2021-12-29 ENCOUNTER — Other Ambulatory Visit: Payer: Self-pay

## 2021-12-29 DIAGNOSIS — Z122 Encounter for screening for malignant neoplasm of respiratory organs: Secondary | ICD-10-CM

## 2021-12-29 DIAGNOSIS — Z87891 Personal history of nicotine dependence: Secondary | ICD-10-CM

## 2021-12-29 DIAGNOSIS — F17210 Nicotine dependence, cigarettes, uncomplicated: Secondary | ICD-10-CM

## 2021-12-30 ENCOUNTER — Ambulatory Visit
Admission: RE | Admit: 2021-12-30 | Discharge: 2021-12-30 | Disposition: A | Discharge status: Home / Self Care | Payer: No Typology Code available for payment source | Source: Ambulatory Visit | Attending: Internal Medicine | Admitting: Internal Medicine

## 2021-12-30 DIAGNOSIS — M5416 Radiculopathy, lumbar region: Secondary | ICD-10-CM

## 2021-12-30 DIAGNOSIS — M545 Low back pain, unspecified: Secondary | ICD-10-CM | POA: Diagnosis not present

## 2022-01-03 ENCOUNTER — Encounter: Payer: Self-pay | Admitting: Internal Medicine

## 2022-01-03 ENCOUNTER — Telehealth: Payer: Self-pay | Admitting: Internal Medicine

## 2022-01-03 NOTE — Telephone Encounter (Signed)
Phone call placed to patient today to go over the results of his MRI of the lumbar spine.  I left a message informing him of who I am and that I was calling to go over the results of imaging studies.  I told him that I will send his results to his MyChart account.

## 2022-01-05 ENCOUNTER — Telehealth: Payer: Self-pay

## 2022-01-05 ENCOUNTER — Other Ambulatory Visit: Payer: Self-pay

## 2022-01-05 ENCOUNTER — Other Ambulatory Visit: Payer: Self-pay | Admitting: Internal Medicine

## 2022-01-05 MED ORDER — LIDOCAINE 5 % EX PTCH: MEDICATED_PATCH | CUTANEOUS | 0 refills | Status: DC

## 2022-01-05 NOTE — Telephone Encounter (Signed)
LIDOCAINE PATCH PA DENIED, MEDICAL CRITERIA NOT MET.

## 2022-01-06 NOTE — Telephone Encounter (Addendum)
Trish from [facility] stated that she needs additional information on the diagnosis for PA for the medication lidocaine (LIDODERM) 5 % patch. Wannetta Sender mentioned she will also send a fax.   Call back: [phone] Fax - [phone]

## 2022-01-10 ENCOUNTER — Other Ambulatory Visit: Payer: Self-pay | Admitting: Internal Medicine

## 2022-01-10 ENCOUNTER — Other Ambulatory Visit: Payer: Self-pay | Admitting: Pulmonary Disease

## 2022-01-10 ENCOUNTER — Other Ambulatory Visit: Payer: Self-pay

## 2022-01-10 DIAGNOSIS — I152 Hypertension secondary to endocrine disorders: Secondary | ICD-10-CM

## 2022-01-10 DIAGNOSIS — E1159 Type 2 diabetes mellitus with other circulatory complications: Secondary | ICD-10-CM

## 2022-01-11 ENCOUNTER — Other Ambulatory Visit: Payer: Self-pay

## 2022-01-11 MED ORDER — NEBIVOLOL HCL 20 MG PO TABS
20.0000 mg | ORAL_TABLET | Freq: Two times a day (BID) | ORAL | 2 refills | Status: DC
  Filled 2022-01-11: qty 60, 30d supply, fill #0
  Filled 2022-02-09: qty 60, 30d supply, fill #1
  Filled 2022-03-15: qty 60, 30d supply, fill #2

## 2022-01-11 MED ORDER — ALBUTEROL SULFATE HFA 108 (90 BASE) MCG/ACT IN AERS
INHALATION_SPRAY | RESPIRATORY_TRACT | 5 refills | Status: DC
  Filled 2022-01-11: qty 18, 16d supply, fill #0
  Filled 2022-01-21: qty 6.7, 17d supply, fill #1
  Filled 2022-02-09: qty 18, 17d supply, fill #2
  Filled 2022-03-05: qty 18, 17d supply, fill #3
  Filled 2022-03-29: qty 18, 17d supply, fill #4
  Filled 2022-04-21: qty 18, 17d supply, fill #5
  Filled 2022-05-10: qty 6.7, 20d supply, fill #5
  Filled 2022-07-16: qty 18, 17d supply, fill #6
  Filled 2023-01-10: qty 18, 17d supply, fill #7

## 2022-01-11 MED ORDER — MONTELUKAST SODIUM 10 MG PO TABS
10.0000 mg | ORAL_TABLET | Freq: Every day | ORAL | 3 refills | Status: DC
  Filled 2022-01-11: qty 30, 30d supply, fill #0
  Filled 2022-02-09: qty 30, 30d supply, fill #1
  Filled 2022-03-29: qty 30, 30d supply, fill #2
  Filled 2023-01-10: qty 30, 30d supply, fill #3

## 2022-01-11 NOTE — Telephone Encounter (Signed)
Requested medication (s) are due for refill today:   Yes for both  Requested medication (s) are on the active medication list:   Yes for both  Future visit scheduled:   Yes for 02/28/2022   Last ordered: Nebivolol 12/02/2021 #60, 0 refills;   Singulair 12/02/2021 #30, 0 refills  Returned because there is a message he needs to keep his upcoming appt for more refills.   Provider to review.      Requested Prescriptions  Pending Prescriptions Disp Refills   Nebivolol HCl 20 MG TABS 60 tablet 0    Sig: Take 1 tablet (20 mg total) by mouth 2 (two) times daily.     Cardiovascular: Beta Blockers 3 Passed - 01/10/2022  1:39 PM      Passed - Cr in normal range and within 360 days    Creatinine, Ser  Date Value Ref Range Status  09/24/2021 1.24 0.76 - 1.27 mg/dL Final         Passed - AST in normal range and within 360 days    AST  Date Value Ref Range Status  09/24/2021 21 0 - 40 IU/L Final         Passed - ALT in normal range and within 360 days    ALT  Date Value Ref Range Status  09/24/2021 27 0 - 44 IU/L Final         Passed - Last BP in normal range    BP Readings from Last 1 Encounters:  12/24/21 127/78         Passed - Last Heart Rate in normal range    Pulse Readings from Last 1 Encounters:  12/24/21 85         Passed - Valid encounter within last 6 months    Recent Outpatient Visits           3 weeks ago Lumbar radiculopathy   [place], Neoma Laming B, MD   6 months ago Severe persistent asthma with allergic rhinitis without complication   [place] Karle Plumber B, MD   7 months ago Severe persistent asthma without complication   [place] Ladell Pier, MD   8 months ago Sinusitis, unspecified chronicity, unspecified location   [place], [place]   11 months ago Hypertension associated with type 2 diabetes  mellitus [facility])   [place], Deborah B, MD       Future Appointments             In 3 weeks Donato Heinz, MD [place] [facility] [place] [place]   In 1 month Ladell Pier, MD [place]             montelukast (SINGULAIR) 10 MG tablet 30 tablet 0    Sig: Take 1 tablet (10 mg total) by mouth at bedtime.     Pulmonology:  Leukotriene Inhibitors Passed - 01/10/2022  1:39 PM      Passed - Valid encounter within last 12 months    Recent Outpatient Visits           3 weeks ago Lumbar radiculopathy   [place], MD   6 months ago Severe persistent asthma with allergic rhinitis without complication   [place]  [facility] Ladell Pier, MD   7 months ago Severe persistent asthma without complication   [place], MD   8 months ago Sinusitis, unspecified chronicity, unspecified location   [place], [place]   11 months ago Hypertension associated with type 2 diabetes mellitus [facility])   [place], MD       Future Appointments             In 3 weeks Donato Heinz, MD [place] [facility] [place]   In 1 month Wynetta Emery, Dalbert Batman, MD [place]

## 2022-01-12 ENCOUNTER — Other Ambulatory Visit: Payer: Self-pay

## 2022-01-13 ENCOUNTER — Other Ambulatory Visit: Payer: Self-pay | Admitting: Internal Medicine

## 2022-01-14 ENCOUNTER — Other Ambulatory Visit: Payer: Self-pay | Admitting: Internal Medicine

## 2022-01-14 DIAGNOSIS — M5416 Radiculopathy, lumbar region: Secondary | ICD-10-CM

## 2022-01-14 MED ORDER — TRAMADOL HCL 50 MG PO TABS: 50.0000 mg | ORAL_TABLET | Freq: Three times a day (TID) | ORAL | 0 refills | Status: DC | PRN

## 2022-01-18 ENCOUNTER — Ambulatory Visit: Payer: No Typology Code available for payment source | Attending: Internal Medicine | Admitting: Internal Medicine

## 2022-01-18 DIAGNOSIS — J320 Chronic maxillary sinusitis: Secondary | ICD-10-CM

## 2022-01-18 MED ORDER — DOXYCYCLINE HYCLATE 100 MG PO TABS: 100.0000 mg | ORAL_TABLET | Freq: Two times a day (BID) | ORAL | 0 refills | Status: DC

## 2022-01-18 NOTE — Progress Notes (Signed)
Patient ID: Joseph Harvey, male   DOB: 12-Mar-1956, 66 y.o.   MRN: [id number] Virtual Visit via Telephone Note  I connected with Joseph Harvey on 01/18/2022 at 1:29 PM by telephone and verified that I am speaking with the correct person using two identifiers  Location: Patient: home Provider: office  Participants: Myself Patient   I discussed the limitations, risks, security and privacy concerns of performing an evaluation and management service by telephone and the availability of in person appointments. I also discussed with the patient that there may be a patient responsible charge related to this service. The patient expressed understanding and agreed to proceed.   History of Present Illness: DM Type2, HL, HTN, HCM (followed by cardiologist Dr. 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  This is an urgent care visit.  He is requesting antibiotics for his sinuses. Over a week ago, he had sent a MyChart message stating that he was having some burning and sensitivity to his eyes.  I requested that he call Dr. Zenia Resides office and see about getting his appointment moved up.  He was scheduled for September but it had to be moved to October because the provider was going to be on vacation. -Today he tells me he thinks it is from his CPAP machine.  He was having some thick mucus in the eyes and the eyes were red.  However that has somewhat cleared up. -Left nostrils is stopped up and he is having some pain and pressure in the right nostrils.  Nasal mucus is yellowish to brown.  He uses a Nettie pot 2-3 times a day.  He used to see Dr. Redmond Baseman but is not sure whether his current insurance will cover.   Outpatient Encounter Medications as of 01/18/2022  Medication Sig Note   acetaminophen (TYLENOL) 325 MG  tablet Take 2 tablets (650 mg total) by mouth every 4 (four) hours as needed for headache or mild pain.    albuterol (PROVENTIL) (2.5 MG/3ML) 0.083% nebulizer solution Take 3 mLs (2.5 mg total) by nebulization every 4 (four) hours as needed for wheezing or shortness of breath (((PLAN B))).    albuterol (VENTOLIN HFA) 108 (90 Base) MCG/ACT inhaler Inhale 2 puffs into the lungs every 4 hours as needed for shortness of breath or wheezing    ALPRAZolam (XANAX) 1 MG tablet Take 1 tablet by mouth 1/2 hr before MRI imaging    amLODipine (NORVASC) 5 MG tablet Take 1 tablet (5 mg total) by mouth in the morning and at bedtime.    ASPIRIN 81 PO Take 81 mg by mouth daily.    baclofen (LIORESAL) 10 MG tablet take 1 tablet by mouth 3 times a day (Patient taking differently: Take 10 mg by mouth 3 (three) times daily as needed (Nerve pain).)    benzonatate (TESSALON) 100 MG capsule Take 1 capsule (100 mg total) by mouth every 8 (eight) hours. (Patient taking differently: Take 100 mg by mouth 2 (two) times daily as needed for cough.) 05/05/2021: Patient takes as needed   Blood Glucose Monitoring Suppl (TRUE METRIX METER) w/Device KIT Use as directed    Budeson-Glycopyrrol-Formoterol (BREZTRI AEROSPHERE) 160-9-4.8 MCG/ACT AERO Inhale 2 puffs into the lungs in the morning and at bedtime.    busPIRone (BUSPAR) 10 MG tablet Take 1 tablet (10 mg total) by mouth 3 (three) times daily. (Patient  taking differently: Take 10 mg by mouth 3 (three) times daily as needed (anxiety).)    Cholecalciferol (VITAMIN D PO) Take 1 tablet by mouth daily.    ezetimibe (ZETIA) 10 MG tablet Take 1 tablet (10 mg total) by mouth daily.    fluticasone (FLONASE) 50 MCG/ACT nasal spray PLACE 2 SPRAYS INTO BOTH NOSTRILS DAILY.    gabapentin (NEURONTIN) 300 MG capsule take 1 capsule by mouth in the morning, then take 1 at lunch and 2 at bedtime (Patient taking differently: Take 300 mg by mouth 3 (three) times daily as needed (Nerve pain).)  05/05/2021: Patient takes as needed   glucose blood (TRUE METRIX BLOOD GLUCOSE TEST) test strip Use as instructed    hydrochlorothiazide (HYDRODIURIL) 25 MG tablet Take 1 tablet (25 mg total) by mouth daily.    ipratropium-albuterol (DUONEB) 0.5-2.5 (3) MG/3ML SOLN INHALE 1 VIAL VIA NEBULIZER TWICE A DAY    lidocaine (LIDODERM) 5 % Apply 1 patch to affected area daily and leave on for 12 hours at a time.  Remove after 12 hours    linaclotide (LINZESS) 72 MCG capsule Take 1 capsule (72 mcg total) by mouth daily before breakfast. (Patient taking differently: Take 72 mcg by mouth daily as needed (constipation).)    metFORMIN (GLUCOPHAGE-XR) 500 MG 24 hr tablet TAKE 1 TABLET BY MOUTH DAILY. (Must have office visit for refills)    montelukast (SINGULAIR) 10 MG tablet Take 1 tablet (10 mg total) by mouth at bedtime.    Nebivolol HCl 20 MG TABS Take 1 tablet (20 mg total) by mouth 2 (two) times daily.    pantoprazole (PROTONIX) 40 MG tablet Take 1 tablet (40 mg total) by mouth daily as needed.    potassium chloride SA (KLOR-CON M) 20 MEQ tablet Take 1 tablet (20 mEq total) by mouth daily as needed (When hands cramp up).    predniSONE (DELTASONE) 10 MG tablet Take 30 mg by mouth daily as needed (Shortness of breath).    Respiratory Therapy Supplies (FLUTTER) DEVI Use three times a day after inhaler or nebulizer use    rosuvastatin (CRESTOR) 40 MG tablet Take 1 tablet (40 mg total) by mouth daily.    silver sulfADIAZINE (SILVADENE) 1 % cream Apply twice a day to affected area    traMADol (ULTRAM) 50 MG tablet Take 1 tablet (50 mg total) by mouth every 8 (eight) hours as needed.    TRUEplus Lancets 28G MISC Use as directed    valsartan (DIOVAN) 320 MG tablet Take 1 tablet (320 mg total) by mouth daily.    VENTOLIN HFA 108 (90 Base) MCG/ACT inhaler Inhale 2 puffs into the lungs every 4 (four) hours as needed for wheezing or shortness of breath.    No facility-administered encounter medications on file as of  01/18/2022.      Observations/Objective: No direct observation done as this was a telephone encounter.  Assessment and Plan: 1. Chronic maxillary sinusitis - doxycycline (VIBRA-TABS) 100 MG tablet; Take 1 tablet (100 mg total) by mouth 2 (two) times daily.  Dispense: 20 tablet; Refill: 0 - Ambulatory referral to ENT   Follow Up Instructions: PRN   I discussed the assessment and treatment plan with the patient. The patient was provided an opportunity to ask questions and all were answered. The patient agreed with the plan and demonstrated an understanding of the instructions.   The patient was advised to call back or seek an in-person evaluation if the symptoms worsen or if the condition fails to  improve as anticipated.  I  Spent 7 minutes on this telephone encounter  This note has been created with Surveyor, quantity. Any transcriptional errors are unintentional.  Karle Plumber, MD

## 2022-01-19 ENCOUNTER — Other Ambulatory Visit: Payer: Self-pay

## 2022-01-19 ENCOUNTER — Ambulatory Visit (INDEPENDENT_AMBULATORY_CARE_PROVIDER_SITE_OTHER): Payer: No Typology Code available for payment source | Admitting: Orthopaedic Surgery

## 2022-01-19 ENCOUNTER — Encounter: Payer: Self-pay | Admitting: Orthopaedic Surgery

## 2022-01-19 ENCOUNTER — Ambulatory Visit: Payer: Self-pay

## 2022-01-19 VITALS — BP 117/76 | HR 91 | Ht 69.0 in | Wt 239.0 lb

## 2022-01-19 DIAGNOSIS — M545 Low back pain, unspecified: Secondary | ICD-10-CM | POA: Diagnosis not present

## 2022-01-19 DIAGNOSIS — M5416 Radiculopathy, lumbar region: Secondary | ICD-10-CM | POA: Diagnosis not present

## 2022-01-19 NOTE — Progress Notes (Signed)
Office Visit Note   Patient: Joseph Harvey           Date of Birth: 04/30/1956           MRN: [id number] Visit Date: 01/19/2022              Requested by: Ladell Pier, MD [address] [place],  [place] [address] PCP: Ladell Pier, MD   Assessment & Plan: Visit Diagnoses:  1. Acute right-sided low back pain, unspecified whether sciatica present   2. Lumbar radiculopathy     Plan: I reviewed MRI scan discussed pathophysiology.  Gave him copy of the report.  We will set him up for single epidural if this not effective then we discussed operative intervention for his sequestered disc fragment with radiculopathy.  We discussed he may have a good chance of avoiding surgery if the epidural is effective.  I will recheck him in 4 weeks.  Follow-Up Instructions: Return in about 4 weeks (around 02/16/2022).   Orders:  Orders Placed This Encounter  Procedures   XR Lumbar Spine 2-3 Views   No orders of the defined types were placed in this encounter.     Procedures: No procedures performed   Clinical Data: No additional findings.   Subjective: Chief Complaint  Patient presents with   Lower Back - Pain    HPI 66 year old new patient visit with increased back pain right leg pain x9 weeks.  Previous lumbar surgery 2017.  He had been seen at [facility] but they do not participate in his insurance.  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  States when he had increased pain he could barely walk had trouble standing trying to bend over was difficult.  He took prednisone without significant relief.  He states the pain is eased off to some degree.  He has used Tylenol, gabapentin also tramadol and baclofen.  He has a history of prostate cancer hyperlipidemia type 2 diabetes on medication orally.  Patient seen Dr. Karle Plumber ordered an MRI scan done on 01/01/2022 which showed extruded sequestered fragment medial  aspect the right foramina impinging on exiting right L3 nerve root.  Patient had previous surgery at L4-5 noted.  Mild degenerative facet changes at L5-S1.  All other lumbar levels look good.  Patient's had pain that radiates down to his right knee.  Review of Systems all the systems noncontributory to HPI.  Negative for falls no bowel bladder associated symptoms.   Objective: Vital Signs: BP 117/76   Pulse 91   Ht '5\' 9"'$  (1.753 m)   Wt 239 lb (108.4 kg)   BMI 35.29 kg/m   Physical Exam Constitutional:      Appearance: He is well-developed.  HENT:     Head: Normocephalic and atraumatic.     Right Ear: External ear normal.     Left Ear: External ear normal.  Eyes:     Pupils: Pupils are equal, round, and reactive to light.  Neck:     Thyroid: No thyromegaly.     Trachea: No tracheal deviation.  Cardiovascular:     Rate and Rhythm: Normal rate.  Pulmonary:     Effort: Pulmonary effort is normal.     Breath sounds: No wheezing.  Abdominal:     General: Bowel sounds are normal.     Palpations: Abdomen is soft.  Musculoskeletal:     Cervical back: Neck supple.  Skin:    General: Skin is warm and dry.     Capillary Refill: Capillary refill takes less than 2 seconds.  Neurological:     Mental Status: He is alert and oriented to person, place, and time.  Psychiatric:        Behavior: Behavior normal.        Thought Content: Thought content normal.        Judgment: Judgment normal.     Ortho Exam negative straight leg raising on the right to 90 degrees.  Trace quad weakness on the right.  Negative logroll the hips pulses are normal.  Negative contralateral straight leg raise.  Hip adductor are normal and symmetrical.  Specialty Comments:  No specialty comments available.  Imaging: Narrative & Impression  CLINICAL DATA:  Low back pain radiating into the right leg for 6 weeks. No known injury. History of prior lumbar surgery.   EXAM: MRI LUMBAR SPINE WITHOUT CONTRAST    TECHNIQUE: Multiplanar, multisequence MR imaging of the lumbar spine was performed. No intravenous contrast was administered.   COMPARISON:  MRI lumbar spine 06/04/2016.   FINDINGS: Segmentation:  Standard.   Alignment:  Normal.   Vertebrae:  No fracture, evidence of discitis, or bone lesion.   Conus medullaris and cauda equina: Conus extends to the T12-L1 level. Conus and cauda equina appear normal.   Paraspinal and other soft tissues: Small right renal cyst noted.   Disc levels:   T10-11 and T11-12 are imaged in the sagittal plane only and negative.   T12-L1: Negative.   L1-2: Negative.   L2-3: Negative.   L3-4: Shallow broad-based disc bulge is seen. An extruded and likely sequestered disc fragment in the medial aspect of right foramen measures 1.1 cm transverse by 0.6 cm AP by 0.8 cm craniocaudal encroaches on the right L3 root. Epidural lipomatosis has worsened. Disc and epidural fat cause moderate compression of the thecal sac. The left foramen is open.   L4-5: Status post posterior decompression. There is a shallow broad-based disc bulge which has increased in size but the central canal and foramina are open.   L5-S1: Mild facet degenerative disease and a shallow disc bulge without stenosis.   IMPRESSION: Extruded and likely sequestered disc fragment in the medial aspect of the right foramen impinges on the exiting right L3 root and likely accounts for the patient's symptoms. There is moderate compression of the thecal sac at L3-4 due to a shallow disc bulge and epidural lipomatosis.   Status post decompression at L4-5.  No stenosis at this level.     Electronically Signed   By: Inge Rise M.D.   On: 01/01/2022 08:10     PMFS History: Patient Active Problem List   Diagnosis Date Noted   PVC (premature ventricular contraction) 08/10/2021   Pain due to onychomycosis of toenails of both feet 08/21/2020   Panic disorder 11/15/2019    Lipomatosis 07/09/2019   Low back pain 09/25/2018   Lumbar radiculopathy 09/25/2018   Constipation 09/17/2018   History of prostate cancer 09/17/2018   Hyperlipidemia associated with type 2 diabetes mellitus ([place]) 09/17/2018   History of paranasal sinus congestion 07/20/2018   Tobacco dependence 07/20/2018   Controlled type 2 diabetes mellitus without complication, without long-term current use of insulin ([place]) 07/20/2018   Vitamin D deficiency 08/18/2016   Hyperlipidemia LDL goal <100 08/10/2016   Renal insufficiency 07/11/2016   Family history of colon cancer 09/30/2015   GERD (gastroesophageal reflux disease) 09/30/2015   History  of colon polyps 09/30/2015   Obesity, morbid ([facility]) 03/07/2015   Allergic rhinitis 12/15/2014   Malignant neoplasm of prostate ([place]) 06/02/2014   Cough 07/25/2012   HBP (high blood pressure) 06/01/2012   Asthma, severe persistent 05/30/2012   Asthma 05/30/2012   Past Medical History:  Diagnosis Date   Allergy    Dust, mold, dust mites   Anemia    Asthma    Cancer ([place])    prostate   Cataract    bilateral repair.   COVID    Diabetes mellitus without complication ([facility] [place])    Glaucoma    Hyperlipidemia    Hypertension    Neuromuscular disorder ([facility])    nerve damage from back surgery   Pneumonia     Family History  Problem Relation Age of Onset   Colon cancer Father    Lung cancer Father        was a smoker   Prostate cancer Father    Lung cancer Mother        was a smoker   Asthma Mother    Allergies Brother    Diabetes Maternal Grandmother    Colon polyps Neg Hx    Esophageal cancer Neg Hx    Stomach cancer Neg Hx    Rectal cancer Neg Hx     Past Surgical History:  Procedure Laterality Date   BACK SURGERY  2017   CATARACT EXTRACTION Bilateral    COLONOSCOPY     2013   COLONOSCOPY  10/03/2019   KNEE ARTHROSCOPY  2005   right   PROSTATE SURGERY     PVC ABLATION N/A 08/10/2021   Procedure: PVC ABLATION;  Surgeon: Constance Haw, MD;  Location: [facility];  Service: Cardiovascular;  Laterality: N/A;   ROBOT ASSISTED LAPAROSCOPIC RADICAL PROSTATECTOMY  03/2010   Social History   Occupational History   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

## 2022-01-20 ENCOUNTER — Other Ambulatory Visit: Payer: Self-pay

## 2022-01-24 ENCOUNTER — Other Ambulatory Visit: Payer: Self-pay

## 2022-01-25 ENCOUNTER — Other Ambulatory Visit: Payer: Self-pay

## 2022-01-30 NOTE — Progress Notes (Unsigned)
Cardiology Office Note:    Date:  02/01/2022   ID:  Joseph Harvey, DOB 1955-12-02, MRN [id number]  PCP:  Ladell Pier, MD  Cardiologist:  Donato Heinz, MD  Electrophysiologist:  Constance Haw, MD   Referring MD: Ladell Pier, MD   No chief complaint on file.   History of Present Illness:    Joseph Harvey is a 66 y.o. male with a hx of prostate cancer in remission, T2DM, hypertension, hyperlipidemia, toabbaco use who presents for follow-up.  He was referred by Dr. Wynetta Emery for evaluation of dyspnea on exertion, intially seen on 04/14/20.  Hospital he reports that he has been having chest pain with minimal exertion.  Also describes having chest pain.  Reports sharp left-sided chest pain, can occur at rest or with exertion.  Typically last for 10 minutes or so.  Also states he has been having occasional lightheadedness, denies any syncope.  Denies any lower extremity edema.  Reports BP has been anywhere from 110s to 160s when he checks at home.  Reports he had a prior test for sleep apnea which was negative.  He has smoked since age 32, less than 1 pack/day.  No known history of heart disease in his immediate family.  Echocardiogram on 03/18/2020 showed severe septal hypertrophy with otherwise mild concentric hypertrophy, mild intracavitary gradient (peak gradient 30 mmHg), normal biventricular function, grade 1 diastolic dysfunction, no significant valvular disease.  Lexiscan Myoview on 04/29/2020 showed normal perfusion, EF 57%.  Preventives monitor x3 days on 05/06/2020 showed 7 episodes of NSVT, longest lasting 3 beats, occasional PVCs (3.9% of beats).  Calcium score 117 on 04/29/2020 (81st percentile).  Cardiac MRI 06/23/2020 showed asymmetric hypertrophy measuring up to 20 mm in basal anterior septum (8 mm and posterior wall), consistent with hypertrophic cardiomyopathy.  Also with patchy LGE in basal septum and RV insertion sites consistent with HCM; LGE accounts for 11%  of total myocardial mass.  Normal LV/RV size and systolic function.  Echo on 04/14/2021 showed EF of 60 to 65%, severe asymmetric hypertrophy of basal septum, no LVOT obstruction, grade 1 diastolic dysfunction, normal RV function, no significant valvular disease.  Referred to EP for evaluation, underwent PVC ablation with Dr. Curt Bears 08/10/2021.  Repeat Zio patch x3 days on 10/07/2021 showed PVCs represent 1.6% of beats.  Since last clinic visit, he reports he is doing okay.  Reports some dyspnea on exertion.  Does report some heartburn after eating that resolves with Tums, otherwise no chest pain.  Reports some lightheadedness with standing, denies any syncope.  Continues to smoke 0.5 packs/day.   BP Readings from Last 3 Encounters:  02/01/22 130/78  01/19/22 117/76  12/24/21 127/78    Wt Readings from Last 3 Encounters:  02/01/22 240 lb 12.8 oz (109.2 kg)  01/19/22 239 lb (108.4 kg)  12/17/21 240 lb (108.9 kg)     Past Medical History:  Diagnosis Date   Allergy    Dust, mold, dust mites   Anemia    Asthma    Cancer ([place])    prostate   Cataract    bilateral repair.   COVID    Diabetes mellitus without complication ([place])    Glaucoma    Hyperlipidemia    Hypertension    Neuromuscular disorder ([place])    nerve damage from back surgery   Pneumonia     Past Surgical History:  Procedure Laterality Date   BACK SURGERY  2017   CATARACT EXTRACTION Bilateral    COLONOSCOPY  2013   COLONOSCOPY  10/03/2019   KNEE ARTHROSCOPY  2005   right   PROSTATE SURGERY     PVC ABLATION N/A 08/10/2021   Procedure: PVC ABLATION;  Surgeon: Constance Haw, MD;  Location: [facility];  Service: Cardiovascular;  Laterality: N/A;   ROBOT ASSISTED LAPAROSCOPIC RADICAL PROSTATECTOMY  03/2010    Current Medications: Current Meds  Medication Sig   acetaminophen (TYLENOL) 325 MG tablet Take 2 tablets (650 mg total) by mouth every 4 (four) hours as needed for headache or mild pain.    albuterol (PROVENTIL) (2.5 MG/3ML) 0.083% nebulizer solution Take 3 mLs (2.5 mg total) by nebulization every 4 (four) hours as needed for wheezing or shortness of breath (((PLAN B))).   albuterol (VENTOLIN HFA) 108 (90 Base) MCG/ACT inhaler Inhale 2 puffs into the lungs every 4 hours as needed for shortness of breath or wheezing   ALPRAZolam (XANAX) 1 MG tablet Take 1 tablet by mouth 1/2 hr before MRI imaging   amLODipine (NORVASC) 5 MG tablet Take 1 tablet (5 mg total) by mouth in the morning and at bedtime.   ASPIRIN 81 PO Take 81 mg by mouth daily.   baclofen (LIORESAL) 10 MG tablet take 1 tablet by mouth 3 times a day (Patient taking differently: Take 10 mg by mouth 3 (three) times daily as needed (Nerve pain).)   benzonatate (TESSALON) 100 MG capsule Take 1 capsule (100 mg total) by mouth every 8 (eight) hours. (Patient taking differently: Take 100 mg by mouth 2 (two) times daily as needed for cough.)   Blood Glucose Monitoring Suppl (TRUE METRIX METER) w/Device KIT Use as directed   Budeson-Glycopyrrol-Formoterol (BREZTRI AEROSPHERE) 160-9-4.8 MCG/ACT AERO Inhale 2 puffs into the lungs in the morning and at bedtime.   busPIRone (BUSPAR) 10 MG tablet Take 1 tablet (10 mg total) by mouth 3 (three) times daily. (Patient taking differently: Take 10 mg by mouth 3 (three) times daily as needed (anxiety).)   Cholecalciferol (VITAMIN D PO) Take 1 tablet by mouth daily.   doxycycline (VIBRA-TABS) 100 MG tablet Take 1 tablet (100 mg total) by mouth 2 (two) times daily.   ezetimibe (ZETIA) 10 MG tablet Take 1 tablet (10 mg total) by mouth daily.   fluticasone (FLONASE) 50 MCG/ACT nasal spray PLACE 2 SPRAYS INTO BOTH NOSTRILS DAILY.   gabapentin (NEURONTIN) 300 MG capsule take 1 capsule by mouth in the morning, then take 1 at lunch and 2 at bedtime (Patient taking differently: Take 300 mg by mouth 3 (three) times daily as needed (Nerve pain).)   glucose blood (TRUE METRIX BLOOD GLUCOSE TEST) test strip Use  as instructed   hydrochlorothiazide (HYDRODIURIL) 25 MG tablet Take 1 tablet (25 mg total) by mouth daily.   ipratropium-albuterol (DUONEB) 0.5-2.5 (3) MG/3ML SOLN INHALE 1 VIAL VIA NEBULIZER TWICE A DAY   lidocaine (LIDODERM) 5 % Apply 1 patch to affected area daily and leave on for 12 hours at a time.  Remove after 12 hours   linaclotide (LINZESS) 72 MCG capsule Take 1 capsule (72 mcg total) by mouth daily before breakfast. (Patient taking differently: Take 72 mcg by mouth daily as needed (constipation).)   metFORMIN (GLUCOPHAGE-XR) 500 MG 24 hr tablet TAKE 1 TABLET BY MOUTH DAILY. (Must have office visit for refills)   montelukast (SINGULAIR) 10 MG tablet Take 1 tablet (10 mg total) by mouth at bedtime.   Nebivolol HCl 20 MG TABS Take 1 tablet (20 mg total) by mouth 2 (two) times  daily.   pantoprazole (PROTONIX) 40 MG tablet Take 1 tablet (40 mg total) by mouth daily as needed.   potassium chloride SA (KLOR-CON M) 20 MEQ tablet Take 1 tablet (20 mEq total) by mouth daily as needed (When hands cramp up).   predniSONE (DELTASONE) 10 MG tablet Take 30 mg by mouth daily as needed (Shortness of breath).   Respiratory Therapy Supplies (FLUTTER) DEVI Use three times a day after inhaler or nebulizer use   rosuvastatin (CRESTOR) 40 MG tablet Take 1 tablet (40 mg total) by mouth daily.   silver sulfADIAZINE (SILVADENE) 1 % cream Apply twice a day to affected area   traMADol (ULTRAM) 50 MG tablet Take 1 tablet (50 mg total) by mouth every 8 (eight) hours as needed.   TRUEplus Lancets 28G MISC Use as directed   valsartan (DIOVAN) 320 MG tablet Take 1 tablet (320 mg total) by mouth daily.   VENTOLIN HFA 108 (90 Base) MCG/ACT inhaler Inhale 2 puffs into the lungs every 4 (four) hours as needed for wheezing or shortness of breath.     Allergies:   Amoxicillin, Lisinopril, Molds & smuts, Robitussin [guaifenesin], and Azithromycin   Social History   Socioeconomic History   Marital status: Single     Spouse name: Not on file   Number of children: 2   Years of education: 12 grade   Highest education level: Not on file  Occupational History   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  Other Topics Concern   Not on file  Social History Narrative   Not on file   Social Determinants of Health   Financial Resource Strain: Not on file  Food Insecurity: Not on file  Transportation Needs: Not on file  Physical Activity: Not on file  Stress: No Stress Concern Present (05/26/2020)   [place]    Feeling of Stress : Only a little  Social Connections: Not on file     Family History: The patient's family history includes Allergies in his brother; Asthma in his mother; Colon cancer in his father; Diabetes in his maternal grandmother; Lung cancer in his father and mother; Prostate cancer in his father. There is no history of Colon polyps, Esophageal cancer, Stomach cancer, or Rectal cancer.  ROS:   Please see the history of present illness.     All other systems reviewed and are negative.  EKGs/Labs/Other Studies Reviewed:    The following studies were reviewed today:   EKG:   09/24/21: Normal sinus rhythm, no PVCs, poor R wave progression 05/05/21: Normal sinus rhythm, PVCs, Q waves in V1/2 04/14/20: normal sinus rhythm, PVCs, no ST abnormalities 05/18/20: EKG was not ordered. 07/14/20: EKG was not ordered. 09/17/20: Sinus rhythm. Rate 96 bpm PVCs,  No ST abnormalities. 03/25/21: Normal sinus rhythm, frequent PVCs (trigeminy)  Recent  Labs: 04/14/2021: Magnesium 2.0 06/15/2021: TSH 0.718 09/16/2021: NT-Pro BNP 336 09/24/2021: ALT 27; BNP 15.1; BUN 13; Creatinine, Ser 1.24; Potassium 3.5; Sodium 141 12/17/2021: Hemoglobin 14.1; Platelets 345  Recent Lipid Panel    Component Value Date/Time   CHOL 184 03/25/2021 0926   TRIG 472 (H) 03/25/2021 0926   HDL 61 03/25/2021 0926   CHOLHDL 3.0 03/25/2021 0926   LDLCALC 53 03/25/2021 0926    Physical Exam:    VS:  BP 130/78   Pulse 86   Ht 5' 9" (1.753 m)   Wt 240 lb 12.8 oz (109.2 kg)   SpO2 96%   BMI 35.56 kg/m     Wt Readings from Last 3 Encounters:  02/01/22 240 lb 12.8 oz (109.2 kg)  01/19/22 239 lb (108.4 kg)  12/17/21 240 lb (108.9 kg)     GEN:  Well nourished, well developed in no acute distress HEENT: Normal NECK: No JVD; No carotid bruits LYMPHATICS: No lymphadenopathy CARDIAC: Irregular, no murmurs, rubs, gallops RESPIRATORY: Expiratory wheezing ABDOMEN: Soft, non-tender, non-distended MUSCULOSKELETAL:  no edema; No deformity  SKIN: Warm and dry NEUROLOGIC:  Alert and oriented x 3 PSYCHIATRIC:  Normal affect   ASSESSMENT:    1. Hypertrophic cardiomyopathy ([place])   2. Hyperlipidemia, unspecified hyperlipidemia type   3. Tobacco use   4. PVC's (premature ventricular contractions)       PLAN:    HCM: Cardiac MRI 06/23/2020 showed asymmetric hypertrophy measuring up to 20 mm and basal anterior septum (8 mm and posterior wall), consistent with hypertrophic cardiomyopathy.  Also with patchy LGE in basal septum and RV insertion sites consistent with HCM; LGE accounts for 11% of total myocardial mass.  Normal LV/RV size and systolic function.  Echocardiogram on 03/18/2020 showed mild LVOT obstruction, peak gradient 13 mmHg. -Recommended first-degree relatives be screened for HCM: He reports he has talked to his daughters and brother/sisters.  Discussed genetic testing  but he requests to hold off at this time -Ideally try to avoid diuretics in HCM but he is volume overloaded on exam.  Started on HCTZ 25 mg daily, repeat echo on 04/14/2021 showed no LVOT obstruction -Continue nebivolol 20 mg BID -NSVT, frequent PVCs on Zio patch.  Referred to EP for evaluation, underwent PVC ablation with Dr Curt Bears on 08/10/21  Chest pain: Atypical in description but does have CAD risk factors (hypertension, hyperlipidemia, T2DM).   Lexiscan Myoview on 04/29/2020 showed normal perfusion, EF 57%.  Calcium score 117 on 04/29/2020 (81st percentile).   PVCs: [employer]ice monitor x3 days on 05/06/2020 showed 7 episodes of NSVT, longest lasting 3 beats, occasional PVCs (3.9% of beats).  Zio patch x3 days on 04/06/2021 showed frequent PVCs (14.3% of beats), 1 episode of NSVT x4 beats -Referred to EP for evaluation, underwent PVC ablation with Dr. Curt Bears 08/10/2021.  Significantly improved, repeat Zio patch x3 days on 10/07/2021 showed PVCs represent 1.6% of beats.  Leg pain: Normal ABIs on 04/23/2019  Hypertension: On amlodipine 10 mg daily, HCTZ 25 mg daily, valsartan 941 mg, Bystolic 20 mg daily twice daily.  Appears controlled.  Check BMET  Tobacco use: Patient counseled on the risk of tobacco use and cessation strongly encouraged.  Have referred to our care guide to work with patient to assist in smoking cessation  Hyperlipidemia: On rosuvastatin 40 mg daily and Zetia 10 mg daily, LDL 53 on 03/25/2021.  Calcium score 117 on 04/29/2020 (81st percentile).  Check lipid panel  T2DM: On Metformin.  A1c 7.8% on 12/17/21  OSA:  Sleep study 10/2020 showed severe OSA, he is now on CPAP and reports compliance  RTC in 6 months   Medication Adjustments/Labs and Tests Ordered: Current medicines are reviewed at length with the patient today.  Concerns regarding medicines are outlined above.  Orders Placed This Encounter  Procedures   Basic metabolic panel   Lipid panel    No orders of the  defined types were placed in this encounter.    Patient Instructions  Medication Instructions:  Your physician recommends that you continue on your current medications as directed. Please refer to the Current Medication list given to you today.  *If you need a refill on your cardiac medications before your next appointment, please call your pharmacy*   Lab Work: BMET, Lipid today  If you have labs (blood work) drawn today and your tests are completely normal, you will receive your results only by: [place] (if you have MyChart) OR A paper copy in the mail If you have any lab test that is abnormal or we need to change your treatment, we will call you to review the results.  Follow-Up: At [facility], you and your health needs are our priority.  As part of our continuing mission to provide you with exceptional heart care, we have created designated Provider Care Teams.  These Care Teams include your primary Cardiologist (physician) and Advanced Practice Providers (APPs -  Physician Assistants and Nurse Practitioners) who all work together to provide you with the care you need, when you need it.  We recommend signing up for the patient portal called "MyChart".  Sign up information is provided on this After Visit Summary.  MyChart is used to connect with patients for Virtual Visits (Telemedicine).  Patients are able to view lab/test results, encounter notes, upcoming appointments, etc.  Non-urgent messages can be sent to your provider as well.   To learn more about what you can do with MyChart, go to [email or web address].    Your next appointment:   6 month(s)  The format for your next appointment:   In Person  Provider:   Donato Heinz, MD      Important Information About Sugar         Signed, Donato Heinz, MD  02/01/2022 8:52 AM    [place]

## 2022-02-01 ENCOUNTER — Ambulatory Visit: Payer: No Typology Code available for payment source | Attending: Cardiology | Admitting: Cardiology

## 2022-02-01 ENCOUNTER — Encounter: Payer: Self-pay | Admitting: Cardiology

## 2022-02-01 VITALS — BP 130/78 | HR 86 | Ht 69.0 in | Wt 240.8 lb

## 2022-02-01 DIAGNOSIS — E785 Hyperlipidemia, unspecified: Secondary | ICD-10-CM | POA: Diagnosis not present

## 2022-02-01 DIAGNOSIS — I493 Ventricular premature depolarization: Secondary | ICD-10-CM | POA: Diagnosis not present

## 2022-02-01 DIAGNOSIS — Z720 Tobacco use: Secondary | ICD-10-CM

## 2022-02-01 DIAGNOSIS — I422 Other hypertrophic cardiomyopathy: Secondary | ICD-10-CM | POA: Diagnosis not present

## 2022-02-01 LAB — LIPID PANEL
Chol/HDL Ratio: 2.1 ratio (ref 0.0–5.0)
Cholesterol, Total: 94 mg/dL — ABNORMAL LOW (ref 100–199)
HDL: 45 mg/dL (ref >39)
LDL Chol Calc (NIH): 24 mg/dL (ref 0–99)
Triglycerides: 149 mg/dL (ref 0–149)
VLDL Cholesterol Cal: 25 mg/dL (ref 5–40)

## 2022-02-01 LAB — BASIC METABOLIC PANEL
BUN/Creatinine Ratio: 11 (ref 10–24)
BUN: 14 mg/dL (ref 8–27)
CO2: 28 mmol/L (ref 20–29)
Calcium: 9.2 mg/dL (ref 8.6–10.2)
Chloride: 101 mmol/L (ref 96–106)
Creatinine, Ser: 1.22 mg/dL (ref 0.76–1.27)
Glucose: 108 mg/dL — ABNORMAL HIGH (ref 70–99)
Potassium: 3.7 mmol/L (ref 3.5–5.2)
Sodium: 142 mmol/L (ref 134–144)
eGFR: 66 mL/min/{1.73_m2} (ref >59)

## 2022-02-01 NOTE — Patient Instructions (Signed)
Medication Instructions:  Your physician recommends that you continue on your current medications as directed. Please refer to the Current Medication list given to you today.  *If you need a refill on your cardiac medications before your next appointment, please call your pharmacy*   Lab Work: BMET, Lipid today  If you have labs (blood work) drawn today and your tests are completely normal, you will receive your results only by: [place] (if you have MyChart) OR A paper copy in the mail If you have any lab test that is abnormal or we need to change your treatment, we will call you to review the results.  Follow-Up: At [facility], you and your health needs are our priority.  As part of our continuing mission to provide you with exceptional heart care, we have created designated Provider Care Teams.  These Care Teams include your primary Cardiologist (physician) and Advanced Practice Providers (APPs -  Physician Assistants and Nurse Practitioners) who all work together to provide you with the care you need, when you need it.  We recommend signing up for the patient portal called "MyChart".  Sign up information is provided on this After Visit Summary.  MyChart is used to connect with patients for Virtual Visits (Telemedicine).  Patients are able to view lab/test results, encounter notes, upcoming appointments, etc.  Non-urgent messages can be sent to your provider as well.   To learn more about what you can do with MyChart, go to [email or web address].    Your next appointment:   6 month(s)  The format for your next appointment:   In Person  Provider:   Donato Heinz, MD      Important Information About Sugar

## 2022-02-02 ENCOUNTER — Other Ambulatory Visit: Payer: Self-pay | Admitting: *Deleted

## 2022-02-02 MED ORDER — ROSUVASTATIN CALCIUM 20 MG PO TABS: 20.0000 mg | ORAL_TABLET | Freq: Every day | ORAL | 3 refills | Status: AC

## 2022-02-04 ENCOUNTER — Other Ambulatory Visit: Payer: Self-pay | Admitting: Internal Medicine

## 2022-02-04 DIAGNOSIS — J320 Chronic maxillary sinusitis: Secondary | ICD-10-CM

## 2022-02-10 ENCOUNTER — Other Ambulatory Visit: Payer: Self-pay

## 2022-02-11 ENCOUNTER — Encounter: Payer: Self-pay | Admitting: Pulmonary Disease

## 2022-02-14 ENCOUNTER — Telehealth: Payer: Self-pay

## 2022-02-14 ENCOUNTER — Other Ambulatory Visit (HOSPITAL_COMMUNITY): Payer: Self-pay

## 2022-02-14 NOTE — Telephone Encounter (Signed)
Routing to Dr. Silas Flood for review.

## 2022-02-14 NOTE — Telephone Encounter (Signed)
Mychart message sent by pt: [facility] (supporting Lanier Clam, MD) 3 days ago    Inhale that you have me on cost too much money do you have any suggestions breezetre is what I'm on now    Routing to both prior auth team and Dr. Silas Flood for review.

## 2022-02-14 NOTE — Telephone Encounter (Signed)
Patient requested cheaper alternative based on Breztri co-pay being too high.   Listed below are prices based on test claims.  ICS+LABA+LAMA Breztri- $150.07 Trelegy- $152.98  ICS+LABA BRAND Advair Diskus- $94.43 Advair HFA- $97.26 BRAND Symbicort- $93.60 Breo- $94.77  LABA+LAMA Anoro- $110.49 Bevespi- $100.47  LABA Serevent- $101.50  LAMA Incruse- $84.89  ICS  Pulmicort Flexhaler- $63.47 Flovent HFA- $101.95 Flovent Disk- $49.09 Arnuity- $50.03

## 2022-02-15 NOTE — Telephone Encounter (Signed)
Unfortunately, the cost for breztri and Trelegy is the same as a combination of multiple inhalers to achieve ICS/LABA/LAMA based on test scripts. Basically, it cost ~$150 regardless of combination. Can we have him apply for manufacturing assistance for [place]?

## 2022-02-16 ENCOUNTER — Other Ambulatory Visit: Payer: Self-pay

## 2022-02-16 ENCOUNTER — Ambulatory Visit: Payer: No Typology Code available for payment source | Admitting: Orthopaedic Surgery

## 2022-02-22 ENCOUNTER — Other Ambulatory Visit: Payer: Self-pay

## 2022-02-23 ENCOUNTER — Ambulatory Visit (INDEPENDENT_AMBULATORY_CARE_PROVIDER_SITE_OTHER): Payer: No Typology Code available for payment source | Admitting: Orthopaedic Surgery

## 2022-02-23 ENCOUNTER — Encounter: Payer: Self-pay | Admitting: Orthopaedic Surgery

## 2022-02-23 VITALS — BP 115/72 | HR 88 | Ht 69.0 in | Wt 240.0 lb

## 2022-02-23 DIAGNOSIS — M5416 Radiculopathy, lumbar region: Secondary | ICD-10-CM | POA: Diagnosis not present

## 2022-02-23 DIAGNOSIS — M545 Low back pain, unspecified: Secondary | ICD-10-CM | POA: Diagnosis not present

## 2022-02-23 NOTE — Progress Notes (Signed)
Office Visit Note   Patient: Joseph Harvey           Date of Birth: August 31, 1955           MRN: [id number] Visit Date: 02/23/2022              Requested by: Ladell Pier, MD [address] [place],  [place] [address] PCP: Ladell Pier, MD   Assessment & Plan: Visit Diagnoses:  1. Acute right-sided low back pain, unspecified whether sciatica present   2. Lumbar radiculopathy     Plan: We will set him up for epidural with Dr. Ernestina Patches right L3-4 H&P.  He can follow-up after the procedure.  Follow-Up Instructions: No follow-ups on file.   Orders:  Orders Placed This Encounter  Procedures   Ambulatory referral to Physical Medicine Rehab   No orders of the defined types were placed in this encounter.     Procedures: No procedures performed   Clinical Data: No additional findings.   Subjective: Chief Complaint  Patient presents with   Lower Back - Pain, Follow-up    HPI 66 year old male returns for follow-up.  Previous surgery 2017.  MRI scan done 01/01/2022 showed extruded fragment right foramina impinging the right L3 nerve root.  We reviewed the scan again as we did previous visit discussed options.  Patient states his back is actually gotten some better.  He gets aching pain in both legs.  Worse on the right than left.  Review of Systems systems updated unchanged from 01/19/2022.   Objective: Vital Signs: BP 115/72   Pulse 88   Ht '5\' 9"'$  (1.753 m)   Wt 240 lb (108.9 kg)   BMI 35.44 kg/m   Physical Exam Constitutional:      Appearance: He is well-developed.  HENT:     Head: Normocephalic and atraumatic.     Right Ear: External ear normal.     Left Ear: External ear normal.  Eyes:     Pupils: Pupils are equal, round, and reactive to light.  Neck:     Thyroid: No thyromegaly.     Trachea: No tracheal deviation.  Cardiovascular:     Rate and Rhythm: Normal rate.  Pulmonary:     Effort: Pulmonary effort is normal.     Breath sounds: No  wheezing.  Abdominal:     General: Bowel sounds are normal.     Palpations: Abdomen is soft.  Musculoskeletal:     Cervical back: Neck supple.  Skin:    General: Skin is warm and dry.     Capillary Refill: Capillary refill takes less than 2 seconds.  Neurological:     Mental Status: He is alert and oriented to person, place, and time.  Psychiatric:        Behavior: Behavior normal.        Thought Content: Thought content normal.        Judgment: Judgment normal.     Ortho Exam quads hip flexors abductors are strong.  Reflexes are intact.  Specialty Comments:  No specialty comments available.  Imaging: No results found.   PMFS History: Patient Active Problem List   Diagnosis Date Noted   PVC (premature ventricular contraction) 08/10/2021   Pain due to onychomycosis of toenails of both feet 08/21/2020   Panic disorder 11/15/2019   Lipomatosis 07/09/2019   Low back pain 09/25/2018   Lumbar radiculopathy 09/25/2018   Constipation 09/17/2018   History of prostate cancer 09/17/2018   Hyperlipidemia  associated with type 2 diabetes mellitus ([place]) 09/17/2018   History of paranasal sinus congestion 07/20/2018   Tobacco dependence 07/20/2018   Controlled type 2 diabetes mellitus without complication, without long-term current use of insulin ([place]) 07/20/2018   Vitamin D deficiency 08/18/2016   Hyperlipidemia LDL goal <100 08/10/2016   Renal insufficiency 07/11/2016   Family history of colon cancer 09/30/2015   GERD (gastroesophageal reflux disease) 09/30/2015   History of colon polyps 09/30/2015   Obesity, morbid ([place]) 03/07/2015   Allergic rhinitis 12/15/2014   Malignant neoplasm of prostate ([place]) 06/02/2014   Cough 07/25/2012   HBP (high blood pressure) 06/01/2012   Asthma, severe persistent 05/30/2012   Asthma 05/30/2012   Past Medical History:  Diagnosis Date   Allergy    Dust, mold, dust mites   Anemia    Asthma    Cancer ([place])    prostate   Cataract     bilateral repair.   COVID    Diabetes mellitus without complication ([place])    Glaucoma    Hyperlipidemia    Hypertension    Neuromuscular disorder ([place])    nerve damage from back surgery   Pneumonia     Family History  Problem Relation Age of Onset   Colon cancer Father    Lung cancer Father        was a smoker   Prostate cancer Father    Lung cancer Mother        was a smoker   Asthma Mother    Allergies Brother    Diabetes Maternal Grandmother    Colon polyps Neg Hx    Esophageal cancer Neg Hx    Stomach cancer Neg Hx    Rectal cancer Neg Hx     Past Surgical History:  Procedure Laterality Date   BACK SURGERY  2017   CATARACT EXTRACTION Bilateral    COLONOSCOPY     2013   COLONOSCOPY  10/03/2019   KNEE ARTHROSCOPY  2005   right   PROSTATE SURGERY     PVC ABLATION N/A 08/10/2021   Procedure: PVC ABLATION;  Surgeon: Constance Haw, MD;  Location: [facility];  Service: Cardiovascular;  Laterality: N/A;   ROBOT ASSISTED LAPAROSCOPIC RADICAL PROSTATECTOMY  03/2010   Social History   Occupational History   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

## 2022-02-25 ENCOUNTER — Other Ambulatory Visit: Payer: Self-pay | Admitting: Cardiology

## 2022-02-28 ENCOUNTER — Ambulatory Visit: Payer: No Typology Code available for payment source | Attending: Internal Medicine | Admitting: Internal Medicine

## 2022-02-28 VITALS — BP 138/80 | HR 90 | Temp 98.1°F | Ht 69.0 in | Wt 242.2 lb

## 2022-02-28 DIAGNOSIS — M5416 Radiculopathy, lumbar region: Secondary | ICD-10-CM

## 2022-02-28 DIAGNOSIS — J320 Chronic maxillary sinusitis: Secondary | ICD-10-CM

## 2022-02-28 DIAGNOSIS — I152 Hypertension secondary to endocrine disorders: Secondary | ICD-10-CM | POA: Diagnosis not present

## 2022-02-28 DIAGNOSIS — E669 Obesity, unspecified: Secondary | ICD-10-CM | POA: Diagnosis not present

## 2022-02-28 DIAGNOSIS — E1169 Type 2 diabetes mellitus with other specified complication: Secondary | ICD-10-CM

## 2022-02-28 DIAGNOSIS — E1159 Type 2 diabetes mellitus with other circulatory complications: Secondary | ICD-10-CM | POA: Diagnosis not present

## 2022-02-28 DIAGNOSIS — J4550 Severe persistent asthma, uncomplicated: Secondary | ICD-10-CM

## 2022-02-28 MED ORDER — METFORMIN HCL ER 500 MG PO TB24: 500.0000 mg | ORAL_TABLET | Freq: Two times a day (BID) | ORAL | 6 refills | Status: DC

## 2022-02-28 MED ORDER — DOXYCYCLINE HYCLATE 100 MG PO TABS: 100.0000 mg | ORAL_TABLET | Freq: Two times a day (BID) | ORAL | 0 refills | Status: DC

## 2022-02-28 MED ORDER — PREDNISONE 10 MG PO TABS: 30.0000 mg | ORAL_TABLET | Freq: Every day | ORAL | 0 refills | Status: DC | PRN

## 2022-02-28 NOTE — Patient Instructions (Signed)
Please check your blood pressure at least once or twice a week with goal being 130/80 or lower.  If your blood pressure is running higher than that, then we will need to restart the Diovan or medication in a similar class.  Please let me know the name of the eye doctor to whom you would like for me to send the referral.

## 2022-02-28 NOTE — Progress Notes (Unsigned)
Patient ID: Joseph Harvey, male    DOB: [birth year]  MRN: [id number]  CC: Diabetes (Diabetes f/u. Med refill on metformin & rosuvastatin./Already received flu vax this season)   Subjective: Joseph Harvey is a 66 y.o. male who presents for chronic ds management His concerns today include:  DM Type2, HL, HTN, HCM (followed by cardiologist Dr. 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  Lumbar radiculopathy:  saw Dr. Lorin Mercy Plans to get ESI shot from Dr. Ernestina Patches.  Waiting for insurance to approve it  DM:  Advised to take metformin twice a day on last visit but I forget to send the prescription reflecting the increased frequency.  Request prescription be sent to his pharmacy.   Appts with Dr. Katy Fitch for eye exam did not work out.  Plans to schedule with another company but does not recall the name of the practice.  He will call me back with name to submit referral.  Requests refill on doxycycline to keep on hand for when his sinuses flareup. Also request prescription for prednisone to keep on hand when his asthma flares up.  Blood pressure elevated today.  Patient tells me that when he saw the cardiologist Dr. Gardiner Rhyme on the 19th of last month, he had told him to discontinue the Diovan 320 mg.  He has discontinued taking it since then.  However I do not see any mention in the cardiologist note of discontinuing the Diovan.  Patient also states the Diovan made him feel bad. -Blood pressure when he saw Dr. Lorin Mercy on the 11th of this month was normal.  Reports his blood pressure has been fine without Diovan. He continues to take amlodipine 10 mg daily, HCTZ 25 mg daily and Bystolic 20 mg twice a day.  Reports having had flu shot and pneumonia vaccine at [facility] on [address] several weeks ago.  Had  COVID booster at [facility] recently.  Patient Active Problem List   Diagnosis Date Noted   PVC (premature ventricular contraction) 08/10/2021   Pain due to onychomycosis of toenails of both feet 08/21/2020   Panic disorder 11/15/2019   Lipomatosis 07/09/2019   Low back pain 09/25/2018   Lumbar radiculopathy 09/25/2018   Constipation 09/17/2018   History of prostate cancer 09/17/2018   Hyperlipidemia associated with type 2 diabetes mellitus ([place]) 09/17/2018   History of paranasal sinus congestion 07/20/2018   Tobacco dependence 07/20/2018   Controlled type 2 diabetes mellitus without complication, without long-term current use of insulin ([place]) 07/20/2018   Vitamin D deficiency 08/18/2016   Hyperlipidemia LDL goal <100 08/10/2016   Renal insufficiency 07/11/2016   Family history of colon cancer 09/30/2015   GERD (gastroesophageal reflux disease) 09/30/2015   History of colon polyps 09/30/2015   Obesity, morbid ([place]) 03/07/2015   Allergic rhinitis 12/15/2014   Malignant neoplasm of prostate ([place]) 06/02/2014   Cough 07/25/2012   HBP (high blood pressure) 06/01/2012   Asthma, severe persistent 05/30/2012   Asthma 05/30/2012     Current Outpatient Medications on File Prior to Visit  Medication Sig Dispense Refill   acetaminophen (TYLENOL) 325 MG tablet Take 2 tablets (650 mg total) by mouth every 4 (four) hours as needed for headache or mild pain.     albuterol (PROVENTIL) (2.5 MG/3ML) 0.083% nebulizer solution Take 3 mLs (2.5 mg total) by nebulization every 4 (four) hours as  needed for wheezing or shortness of breath (((PLAN B))). 360 mL 6   albuterol (VENTOLIN HFA) 108 (90 Base) MCG/ACT inhaler Inhale 2 puffs into the lungs every 4 hours as needed for shortness of breath or wheezing 18 g 5   ALPRAZolam (XANAX) 1 MG tablet Take 1 tablet by mouth 1/2 hr before MRI imaging 1 tablet 0   amLODipine (NORVASC) 5 MG tablet Take 1 tablet (5 mg total) by mouth in the morning and at bedtime.  180 tablet 1   ASPIRIN 81 PO Take 81 mg by mouth daily.     baclofen (LIORESAL) 10 MG tablet take 1 tablet by mouth 3 times a day (Patient taking differently: Take 10 mg by mouth 3 (three) times daily as needed (Nerve pain).) 270 tablet 3   benzonatate (TESSALON) 100 MG capsule Take 1 capsule (100 mg total) by mouth every 8 (eight) hours. (Patient taking differently: Take 100 mg by mouth 2 (two) times daily as needed for cough.) 21 capsule 0   Blood Glucose Monitoring Suppl (TRUE METRIX METER) w/Device KIT Use as directed 1 kit 0   Budeson-Glycopyrrol-Formoterol (BREZTRI AEROSPHERE) 160-9-4.8 MCG/ACT AERO Inhale 2 puffs into the lungs in the morning and at bedtime. 10.7 g 11   busPIRone (BUSPAR) 10 MG tablet Take 1 tablet (10 mg total) by mouth 3 (three) times daily. (Patient taking differently: Take 10 mg by mouth 3 (three) times daily as needed (anxiety).) 90 tablet 3   Cholecalciferol (VITAMIN D PO) Take 1 tablet by mouth daily.     ezetimibe (ZETIA) 10 MG tablet Take 1 tablet (10 mg total) by mouth daily. 90 tablet 3   fluticasone (FLONASE) 50 MCG/ACT nasal spray PLACE 2 SPRAYS INTO BOTH NOSTRILS DAILY. 16 g 11   gabapentin (NEURONTIN) 300 MG capsule take 1 capsule by mouth in the morning, then take 1 at lunch and 2 at bedtime (Patient taking differently: Take 300 mg by mouth 3 (three) times daily as needed (Nerve pain).) 360 capsule 3   glucose blood (TRUE METRIX BLOOD GLUCOSE TEST) test strip Use as instructed 100 each 12   hydrochlorothiazide (HYDRODIURIL) 25 MG tablet TAKE 1 TABLET BY MOUTH EVERY DAY 90 tablet 1   ipratropium-albuterol (DUONEB) 0.5-2.5 (3) MG/3ML SOLN INHALE 1 VIAL VIA NEBULIZER TWICE A DAY 90 mL 3   lidocaine (LIDODERM) 5 % Apply 1 patch to affected area daily and leave on for 12 hours at a time.  Remove after 12 hours 30 patch 0   linaclotide (LINZESS) 72 MCG capsule Take 1 capsule (72 mcg total) by mouth daily before breakfast. (Patient taking differently: Take 72 mcg by  mouth daily as needed (constipation).) 30 capsule 6   montelukast (SINGULAIR) 10 MG tablet Take 1 tablet (10 mg total) by mouth at bedtime. 30 tablet 3   Nebivolol HCl 20 MG TABS Take 1 tablet (20 mg total) by mouth 2 (two) times daily. 60 tablet 2   pantoprazole (PROTONIX) 40 MG tablet Take 1 tablet (40 mg total) by mouth daily as needed. 30 tablet 2   potassium chloride SA (KLOR-CON M) 20 MEQ tablet Take 1 tablet (20 mEq total) by mouth daily as needed (When hands cramp up). 60 tablet 2   Respiratory Therapy Supplies (FLUTTER) DEVI Use three times a day after inhaler or nebulizer use 1 each 0   rosuvastatin (CRESTOR) 20 MG tablet Take 1 tablet (20 mg total) by mouth daily. 90 tablet 3   silver sulfADIAZINE (SILVADENE) 1 % cream Apply  twice a day to affected area 50 g 0   traMADol (ULTRAM) 50 MG tablet Take 1 tablet (50 mg total) by mouth every 8 (eight) hours as needed. 90 tablet 0   TRUEplus Lancets 28G MISC Use as directed 100 each 4   valsartan (DIOVAN) 320 MG tablet Take 1 tablet (320 mg total) by mouth daily. 90 tablet 3   VENTOLIN HFA 108 (90 Base) MCG/ACT inhaler Inhale 2 puffs into the lungs every 4 (four) hours as needed for wheezing or shortness of breath. 18 g 6   No current facility-administered medications on file prior to visit.    Allergies  Allergen Reactions   Amoxicillin Anaphylaxis    Has patient had a PCN reaction causing immediate rash, facial/tongue/throat swelling, SOB or lightheadedness with hypotension: Yes Has patient had a PCN reaction causing severe rash involving mucus membranes or skin necrosis: No Has patient had a PCN reaction that required hospitalization: Yes Has patient had a PCN reaction occurring within the last 10 years: Yes If all of the above answers are "NO", then may proceed with Cephalosporin use.    Lisinopril Shortness Of Breath   Molds & Smuts Anaphylaxis   Robitussin [Guaifenesin] Shortness Of Breath    wheezing   Azithromycin Other (See  Comments)    Social History   Socioeconomic History   Marital status: Single    Spouse name: Not on file   Number of children: 2   Years of education: 12 grade   Highest education level: Not on file  Occupational History   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  Other Topics Concern   Not on file  Social History Narrative   Not on file   Social Determinants of Health   Financial Resource Strain: Not on file  Food Insecurity: Not on file  Transportation Needs: Not on file  Physical Activity: Not on file  Stress: No Stress Concern Present (05/26/2020)   [place]    Feeling of Stress : Only a little  Social Connections: Not on file  Intimate Partner Violence: Not on file    Family History  Problem Relation Age of Onset   Colon cancer Father    Lung cancer Father        was a smoker   Prostate cancer Father    Lung cancer Mother        was a smoker   Asthma Mother    Allergies Brother    Diabetes Maternal Grandmother    Colon polyps Neg Hx    Esophageal cancer Neg Hx    Stomach cancer Neg Hx    Rectal cancer Neg Hx     Past Surgical History:  Procedure Laterality Date   BACK SURGERY  2017   CATARACT EXTRACTION Bilateral    COLONOSCOPY  2013   COLONOSCOPY  10/03/2019   KNEE ARTHROSCOPY  2005   right   PROSTATE SURGERY     PVC ABLATION N/A 08/10/2021   Procedure: PVC ABLATION;  Surgeon: Constance Haw, MD;   Location: [facility];  Service: Cardiovascular;  Laterality: N/A;   ROBOT ASSISTED LAPAROSCOPIC RADICAL PROSTATECTOMY  03/2010    ROS: Review of Systems Negative except as stated above  PHYSICAL EXAM: BP 138/80   Pulse 90   Temp 98.1 F (36.7 C) (Oral)   Ht 5' 9" (1.753 m)   Wt 242 lb 3.2 oz (109.9 kg)   SpO2 95%   BMI 35.77 kg/m   Physical Exam  General appearance - alert, well appearing, older AAM and in no distress Mental status - normal mood, behavior, speech, dress, motor activity, and thought processes Chest -few scattered wheezes but good air entry. Heart - normal rate, regular rhythm, normal S1, S2, no murmurs, rubs, clicks or gallops Extremities -no lower extremity edema.      Latest Ref Rng & Units 02/01/2022    9:17 AM 09/24/2021    9:34 AM 08/03/2021    7:53 AM  CMP  Glucose 70 - 99 mg/dL 108  100  117   BUN 8 - 27 mg/dL _0 Creatinine 0.76 - 1.27 mg/dL 1.22  1.24  1.21   Sodium 134 - 144 mmol/L 142  141  139   Potassium 3.5 - 5.2 mmol/L 3.7  3.5  4.1   Chloride 96 - 106 mmol/L 101  99  96   CO2 20 - 29 mmol/L _1 Calcium 8.6 - 10.2 mg/dL 9.2  9.5  9.6   Total Protein 6.0 - 8.5 g/dL  7.0    Total Bilirubin 0.0 - 1.2 mg/dL  0.8    Alkaline Phos 44 - 121 IU/L  71    AST 0 - 40 IU/L  21    ALT 0 - 44 IU/L  27     Lipid Panel     Component Value Date/Time   CHOL 94 (L) 02/01/2022 0917   TRIG 149 02/01/2022 0917   HDL 45 02/01/2022 0917   CHOLHDL 2.1 02/01/2022 0917   LDLCALC 24 02/01/2022 0917    CBC    Component Value Date/Time   WBC 12.2 (H) 12/17/2021 1526   WBC 8.7 02/26/2020 1007   RBC 4.41 12/17/2021 1526   RBC 4.99 02/26/2020 1007   HGB 14.1 12/17/2021 1526   HCT 40.6 12/17/2021 1526   PLT 345 12/17/2021 1526   MCV 92 12/17/2021 1526   MCH 32.0 12/17/2021 1526   MCH 31.3 06/20/2018 0309   MCHC 34.7 12/17/2021 1526   MCHC 35.0 02/26/2020 1007   RDW 13.0 12/17/2021 1526   LYMPHSABS 3.2 02/26/2020 1007    MONOABS 0.7 02/26/2020 1007   EOSABS 0.1 02/26/2020 1007   BASOSABS 0.2 (H) 02/26/2020 1007    ASSESSMENT AND PLAN:  1. Type 2 diabetes mellitus with obesity (HCC) Continue metformin.  Updated prescription sent to his pharmacy reflecting the twice a day dosing. - metFORMIN (GLUCOPHAGE-XR) 500 MG 24 hr tablet; Take 1 tablet (500 mg total) by mouth 2 (two) times daily with a meal.  Dispense: 60 tablet; Refill: 6 - Microalbumin / creatinine urine ratio  2. Hypertension associated with type 2 diabetes mellitus ([place]) At goal but patient reports readings at home have been good.  He has discontinued Diovan.  He will continue HCTZ, Norvasc and bisoprolol.  Advised to monitor blood pressure at home with goal being 130/80 or lower.  3. Chronic maxillary sinusitis - doxycycline (VIBRA-TABS) 100 MG tablet; Take 1 tablet (100 mg total) by mouth 2 (two) times daily.  Dispense: 20 tablet; Refill: 0  4. Severe persistent asthma with allergic rhinitis without complication - predniSONE (DELTASONE) 10 MG tablet; Take 3 tablets (30 mg total) by mouth daily as needed (Shortness of breath).  Dispense: 10 tablet; Refill: 0  5. Lumbar radiculopathy Await approval from his insurance to have ESI to the lumbar spine.    Patient was given the opportunity to ask questions.  Patient verbalized understanding of the plan and was able to repeat key elements of the plan.   This documentation was completed using Radio producer.  Any transcriptional errors are unintentional.  Orders Placed This Encounter  Procedures   Microalbumin / creatinine urine ratio     Requested Prescriptions   Signed Prescriptions Disp Refills   metFORMIN (GLUCOPHAGE-XR) 500 MG 24 hr tablet 60 tablet 6    Sig: Take 1 tablet (500 mg total) by mouth 2 (two) times daily with a meal.   doxycycline (VIBRA-TABS) 100 MG tablet 20 tablet 0    Sig: Take 1 tablet (100 mg total) by mouth 2 (two) times daily.   predniSONE  (DELTASONE) 10 MG tablet 10 tablet 0    Sig: Take 3 tablets (30 mg total) by mouth daily as needed (Shortness of breath).    Return in about 4 months (around 07/01/2022).  Karle Plumber, MD, FACP

## 2022-03-01 ENCOUNTER — Ambulatory Visit (INDEPENDENT_AMBULATORY_CARE_PROVIDER_SITE_OTHER): Payer: No Typology Code available for payment source | Admitting: Physical Medicine and Rehabilitation

## 2022-03-01 ENCOUNTER — Ambulatory Visit: Payer: Self-pay

## 2022-03-01 VITALS — BP 125/66 | HR 87

## 2022-03-01 DIAGNOSIS — F411 Generalized anxiety disorder: Secondary | ICD-10-CM

## 2022-03-01 DIAGNOSIS — M5416 Radiculopathy, lumbar region: Secondary | ICD-10-CM | POA: Diagnosis not present

## 2022-03-01 DIAGNOSIS — M5116 Intervertebral disc disorders with radiculopathy, lumbar region: Secondary | ICD-10-CM | POA: Diagnosis not present

## 2022-03-01 LAB — MICROALBUMIN / CREATININE URINE RATIO
Creatinine, Urine: 153 mg/dL
Microalb/Creat Ratio: 6 mg/g creat (ref 0–29)
Microalbumin, Urine: 9.1 ug/mL

## 2022-03-01 MED ORDER — DIAZEPAM 10 MG PO TABS: 10.0000 mg | ORAL_TABLET | ORAL | 0 refills | Status: DC

## 2022-03-01 MED ORDER — METHYLPREDNISOLONE ACETATE 80 MG/ML IJ SUSP: 40.0000 mg | Freq: Once | INTRAMUSCULAR | Status: DC

## 2022-03-01 NOTE — Progress Notes (Signed)
Numeric Pain Rating Scale and Functional Assessment Average Pain 5, varies from 5-10   In the last MONTH (on 0-10 scale) has pain interfered with the following?  1. General activity like being  able to carry out your everyday physical activities such as walking, climbing stairs, carrying groceries, or moving a chair?  Rating(10)   +Driver, -BT, -Dye Allergies.  Pain and numbness radiates to both legs. Gabapentin for pain

## 2022-03-01 NOTE — Progress Notes (Signed)
Joseph Harvey - 66 y.o. male MRN [id number]  Date of birth: [birth year]  Office Visit Note: Visit Date: 03/01/2022 PCP: Ladell Pier, MD Referred by: Ladell Pier, MD  Subjective: Chief Complaint  Patient presents with   Lower Back - Pain   HPI: Joseph Harvey is a 66 y.o. male who comes in today at the request of Dr. Rodell Perna for evaluation management of chronic worsening severe low back and right more than left hip and leg pain.  He reports 12 weeks of significant right-sided low back pain and some symptoms into both legs right worse than left.  History of prior lumbar surgery in 2017.  He has not noted any specific weakness but sometimes feels like the leg will out at times.  He is getting a lot of paresthesias as well.  He was seen in [facility] [place] but his insurance was not covered there.  He has not had any prior injections into the spine.  He has had some knee injections in the past.  We have thought about having him come in just for the injection when he talks about having significant anxiety problems and claustrophobia as well as significant breathing problems.  His case is also complicated by type 2 diabetes with last hemoglobin A1c of 7.8 about 8 months ago.  He is not on insulin.  He does have significant shortness of breath.  No allergies to contrast.  He has had good conservative care with medication management and trial of prednisone as well as activity modification.  Dr. Lorin Mercy did obtain MRI of the lumbar spine and this is reviewed below.  Does show disc herniation on the right at L3-4 more into the foramen.        Review of Systems  Musculoskeletal:  Positive for back pain.  Neurological:  Positive for tingling.  All other systems reviewed and are negative.  Otherwise per HPI.  Assessment & Plan: Visit Diagnoses:    ICD-10-CM   1. Lumbar radiculopathy  M54.16 Epidural Steroid injection    methylPREDNISolone acetate (DEPO-MEDROL) injection 40 mg     Ambulatory referral to Physical Medicine Rehab    CANCELED: XR C-ARM NO REPORT    2. Radiculopathy due to lumbar intervertebral disc disorder  M51.16 Ambulatory referral to Physical Medicine Rehab    3. Pre-operative anxiety  F41.1        Plan: Findings:  Chronic worsening severe right more than left low back hip and leg pain with right disc herniation and subacute on chronic pain.  This seems to be consistent with an L3 radicular pain on the right more than left.  Does have some arthritic changes in general.  At this point we will provide preprocedure Valium and he can bring in his nebulizer he needs to for his breathing.  He will continue to follow-up with Dr. Lorin Mercy this may require discectomy.    Meds & Orders:  Meds ordered this encounter  Medications   methylPREDNISolone acetate (DEPO-MEDROL) injection 40 mg   diazepam (VALIUM) 10 MG tablet    Sig: Take 1 tablet (10 mg total) by mouth 60 (sixty) minutes before procedure for 1 dose. Take with a sip of water, on an empty stomach.    Dispense:  1 tablet    Refill:  0    Must have a driver. Do not drive or operate machinery x 24 hours after taking this medication. Avoid taking within 4 hours of having taken an opioid pain medications.  Orders Placed This Encounter  Procedures   Ambulatory referral to Physical Medicine Rehab   Epidural Steroid injection    Follow-up: Return for Right L3 transforaminal epidural steroid injection.   Procedures: No procedures performed      Clinical History: MRI LUMBAR SPINE WITHOUT CONTRAST   TECHNIQUE: Multiplanar, multisequence MR imaging of the lumbar spine was performed. No intravenous contrast was administered.   COMPARISON:  MRI lumbar spine 06/04/2016.   FINDINGS: Segmentation:  Standard.   Alignment:  Normal.   Vertebrae:  No fracture, evidence of discitis, or bone lesion.   Conus medullaris and cauda equina: Conus extends to the T12-L1 level. Conus and cauda equina appear  normal.   Paraspinal and other soft tissues: Small right renal cyst noted.   Disc levels:   T10-11 and T11-12 are imaged in the sagittal plane only and negative.   T12-L1: Negative.   L1-2: Negative.   L2-3: Negative.   L3-4: Shallow broad-based disc bulge is seen. An extruded and likely sequestered disc fragment in the medial aspect of right foramen measures 1.1 cm transverse by 0.6 cm AP by 0.8 cm craniocaudal encroaches on the right L3 root. Epidural lipomatosis has worsened. Disc and epidural fat cause moderate compression of the thecal sac. The left foramen is open.   L4-5: Status post posterior decompression. There is a shallow broad-based disc bulge which has increased in size but the central canal and foramina are open.   L5-S1: Mild facet degenerative disease and a shallow disc bulge without stenosis.   IMPRESSION: Extruded and likely sequestered disc fragment in the medial aspect of the right foramen impinges on the exiting right L3 root and likely accounts for the patient's symptoms. There is moderate compression of the thecal sac at L3-4 due to a shallow disc bulge and epidural lipomatosis.   Status post decompression at L4-5.  No stenosis at this level.     Electronically Signed   By: Inge Rise M.D.   On: 01/01/2022 08:10   He reports that he has been smoking cigarettes. He started smoking about 47 years ago. He has a 34.50 pack-year smoking history. He has never used smokeless tobacco.  Recent Labs    06/15/21 1019 12/17/21 1331  HGBA1C 7.3* 7.8*    Objective:  VS:  HT:    WT:   BMI:     BP:125/66  HR:87bpm  TEMP: ( )  RESP:  Physical Exam Vitals and nursing note reviewed.  Constitutional:      General: He is not in acute distress.    Appearance: Normal appearance. He is obese. He is not ill-appearing.  HENT:     Head: Normocephalic and atraumatic.     Right Ear: External ear normal.     Left Ear: External ear normal.     Nose:  No congestion.  Eyes:     Extraocular Movements: Extraocular movements intact.  Cardiovascular:     Rate and Rhythm: Normal rate.     Pulses: Normal pulses.  Pulmonary:     Effort: Pulmonary effort is normal. No respiratory distress.  Abdominal:     General: There is no distension.     Palpations: Abdomen is soft.  Musculoskeletal:        General: No tenderness or signs of injury.     Cervical back: Neck supple.     Right lower leg: No edema.     Left lower leg: No edema.     Comments: He ambulates without aid he  is somewhat slow going to sit to stand.  He has pain with forward flexion.  Negative slump test bilaterally.  Some paresthesia in the right L3 dermatome.  No pain with hip rotation.  Skin:    Findings: No erythema or rash.  Neurological:     General: No focal deficit present.     Mental Status: He is alert and oriented to person, place, and time.     Sensory: Sensory deficit present.     Motor: No weakness or abnormal muscle tone.     Coordination: Coordination normal.     Gait: Gait abnormal.  Psychiatric:        Mood and Affect: Mood normal.        Behavior: Behavior normal.     Ortho Exam  Imaging: No results found.  Past Medical/Family/Surgical/Social History: Medications & Allergies reviewed per EMR, new medications updated. Patient Active Problem List   Diagnosis Date Noted   PVC (premature ventricular contraction) 08/10/2021   Pain due to onychomycosis of toenails of both feet 08/21/2020   Panic disorder 11/15/2019   Lipomatosis 07/09/2019   Low back pain 09/25/2018   Lumbar radiculopathy 09/25/2018   Constipation 09/17/2018   History of prostate cancer 09/17/2018   Hyperlipidemia associated with type 2 diabetes mellitus ([place]) 09/17/2018   History of paranasal sinus congestion 07/20/2018   Tobacco dependence 07/20/2018   Controlled type 2 diabetes mellitus without complication, without long-term current use of insulin ([place]) 07/20/2018   Vitamin D  deficiency 08/18/2016   Hyperlipidemia LDL goal <100 08/10/2016   Renal insufficiency 07/11/2016   Family history of colon cancer 09/30/2015   GERD (gastroesophageal reflux disease) 09/30/2015   History of colon polyps 09/30/2015   Obesity, morbid ([place]) 03/07/2015   Allergic rhinitis 12/15/2014   Malignant neoplasm of prostate ([place]) 06/02/2014   Cough 07/25/2012   HBP (high blood pressure) 06/01/2012   Asthma, severe persistent 05/30/2012   Asthma 05/30/2012   Past Medical History:  Diagnosis Date   Allergy    Dust, mold, dust mites   Anemia    Asthma    Cancer ([place])    prostate   Cataract    bilateral repair.   COVID    Diabetes mellitus without complication ([place])    Glaucoma    Hyperlipidemia    Hypertension    Neuromuscular disorder ([place])    nerve damage from back surgery   Pneumonia    Family History  Problem Relation Age of Onset   Colon cancer Father    Lung cancer Father        was a smoker   Prostate cancer Father    Lung cancer Mother        was a smoker   Asthma Mother    Allergies Brother    Diabetes Maternal Grandmother    Colon polyps Neg Hx    Esophageal cancer Neg Hx    Stomach cancer Neg Hx    Rectal cancer Neg Hx    Past Surgical History:  Procedure Laterality Date   BACK SURGERY  2017   CATARACT EXTRACTION Bilateral    COLONOSCOPY     2013   COLONOSCOPY  10/03/2019   KNEE ARTHROSCOPY  2005   right   PROSTATE SURGERY     PVC ABLATION N/A 08/10/2021   Procedure: PVC ABLATION;  Surgeon: Constance Haw, MD;  Location: [facility];  Service: Cardiovascular;  Laterality: N/A;   ROBOT ASSISTED LAPAROSCOPIC RADICAL PROSTATECTOMY  03/2010  Social History   Occupational History   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

## 2022-03-07 ENCOUNTER — Other Ambulatory Visit: Payer: Self-pay

## 2022-03-09 ENCOUNTER — Encounter: Payer: Self-pay | Admitting: Internal Medicine

## 2022-03-09 NOTE — Progress Notes (Signed)
And approved by [employer] for lumbar [email or web address]  Authorization number is [id number].  Approval dates 10/16 - 05/15/2022.

## 2022-03-11 DIAGNOSIS — G4733 Obstructive sleep apnea (adult) (pediatric): Secondary | ICD-10-CM | POA: Diagnosis not present

## 2022-03-11 DIAGNOSIS — I272 Pulmonary hypertension, unspecified: Secondary | ICD-10-CM | POA: Diagnosis not present

## 2022-03-11 DIAGNOSIS — J4550 Severe persistent asthma, uncomplicated: Secondary | ICD-10-CM | POA: Diagnosis not present

## 2022-03-15 ENCOUNTER — Other Ambulatory Visit: Payer: Self-pay | Admitting: Internal Medicine

## 2022-03-15 ENCOUNTER — Other Ambulatory Visit: Payer: Self-pay

## 2022-03-15 ENCOUNTER — Ambulatory Visit
Admission: EM | Admit: 2022-03-15 | Discharge: 2022-03-15 | Disposition: A | Discharge status: Home / Self Care | Payer: No Typology Code available for payment source | Attending: Emergency Medicine | Admitting: Emergency Medicine

## 2022-03-15 DIAGNOSIS — J4550 Severe persistent asthma, uncomplicated: Secondary | ICD-10-CM

## 2022-03-15 DIAGNOSIS — H66001 Acute suppurative otitis media without spontaneous rupture of ear drum, right ear: Secondary | ICD-10-CM | POA: Diagnosis not present

## 2022-03-15 DIAGNOSIS — J019 Acute sinusitis, unspecified: Secondary | ICD-10-CM

## 2022-03-15 DIAGNOSIS — M5416 Radiculopathy, lumbar region: Secondary | ICD-10-CM

## 2022-03-15 DIAGNOSIS — B9689 Other specified bacterial agents as the cause of diseases classified elsewhere: Secondary | ICD-10-CM | POA: Diagnosis not present

## 2022-03-15 DIAGNOSIS — J4541 Moderate persistent asthma with (acute) exacerbation: Secondary | ICD-10-CM

## 2022-03-15 MED ORDER — FLUTICASONE PROPIONATE 50 MCG/ACT NA SUSP: 1.0000 | Freq: Every day | NASAL | 1 refills | Status: DC

## 2022-03-15 MED ORDER — METHYLPREDNISOLONE SODIUM SUCC 125 MG IJ SOLR
125.0000 mg | Freq: Once | INTRAMUSCULAR | Status: AC
  Administered 2022-03-15: 125 mg via INTRAMUSCULAR

## 2022-03-15 MED ORDER — IPRATROPIUM BROMIDE 0.06 % NA SOLN: 2.0000 | Freq: Three times a day (TID) | NASAL | 1 refills | Status: DC

## 2022-03-15 MED ORDER — LEVOFLOXACIN 750 MG PO TABS: 750.0000 mg | ORAL_TABLET | Freq: Every day | ORAL | 0 refills | Status: AC

## 2022-03-15 MED ORDER — METHYLPREDNISOLONE 4 MG PO TBPK: ORAL_TABLET | ORAL | 0 refills | Status: DC

## 2022-03-15 MED ORDER — TRELEGY ELLIPTA 200-62.5-25 MCG/ACT IN AEPB: 1.0000 | INHALATION_SPRAY | Freq: Every morning | RESPIRATORY_TRACT | 0 refills | Status: AC

## 2022-03-15 NOTE — ED Provider Notes (Signed)
[facility]    CSN: [id number] Arrival date & time: 03/15/22  1505    HISTORY   Chief Complaint  Patient presents with   Facial Pain   Shortness of Breath   HPI Joseph Harvey is a pleasant, 66 y.o. male who presents to urgent care today. Pt c/o SOB, facial pressure from sinuses and congestion for the past 2 weeks.  Patient states has been consistently using his albuterol inhaler and some leftover prednisone, states he feels like he is not making progress.  Patient denies fever, aches, chills, nausea, vomiting, diarrhea, known sick contacts.  The history is provided by the patient.  Shortness of Breath Severity:  Severe Onset quality:  Gradual Duration:  2 days Timing:  Intermittent Progression:  Worsening Chronicity:  Recurrent Context: activity, known allergens, smoke exposure, URI and weather changes   Relieved by:  Inhaler Worsened by:  Activity, coughing, smoke exposure, exertion and deep breathing Ineffective treatments:  Inhaler Associated symptoms: cough   Associated symptoms: no abdominal pain, no chest pain, no claudication, no diaphoresis, no ear pain, no fever, no headaches, no hemoptysis, no neck pain, no PND, no rash, no sore throat, no sputum production, no syncope, no swollen glands, no vomiting and no wheezing   Associated symptoms comment:  Sinus congestion Risk factors comment:  COPD  Past Medical History:  Diagnosis Date   Allergy    Dust, mold, dust mites   Anemia    Asthma    Cancer (HCC)    prostate   Cataract    bilateral repair.   COVID    Diabetes mellitus without complication ([place])    Glaucoma    Hyperlipidemia    Hypertension    Neuromuscular disorder ([place])    nerve damage from back surgery   Pneumonia    Patient Active Problem List   Diagnosis Date Noted   PVC (premature ventricular contraction) 08/10/2021   Pain due to onychomycosis of toenails of both feet 08/21/2020   Panic disorder 11/15/2019   Lipomatosis  07/09/2019   Low back pain 09/25/2018   Lumbar radiculopathy 09/25/2018   Constipation 09/17/2018   History of prostate cancer 09/17/2018   Hyperlipidemia associated with type 2 diabetes mellitus ([place]) 09/17/2018   History of paranasal sinus congestion 07/20/2018   Tobacco dependence 07/20/2018   Controlled type 2 diabetes mellitus without complication, without long-term current use of insulin ([place]) 07/20/2018   Vitamin D deficiency 08/18/2016   Hyperlipidemia LDL goal <100 08/10/2016   Renal insufficiency 07/11/2016   Family history of colon cancer 09/30/2015   GERD (gastroesophageal reflux disease) 09/30/2015   History of colon polyps 09/30/2015   Obesity, morbid ([place]) 03/07/2015   Allergic rhinitis 12/15/2014   Malignant neoplasm of prostate ([place]) 06/02/2014   Cough 07/25/2012   HBP (high blood pressure) 06/01/2012   Asthma, severe persistent 05/30/2012   Asthma 05/30/2012   Past Surgical History:  Procedure Laterality Date   BACK SURGERY  2017   CATARACT EXTRACTION Bilateral    COLONOSCOPY     2013   COLONOSCOPY  10/03/2019   KNEE ARTHROSCOPY  2005   right   PROSTATE SURGERY     PVC ABLATION N/A 08/10/2021   Procedure: PVC ABLATION;  Surgeon: Constance Haw, MD;  Location: [facility];  Service: Cardiovascular;  Laterality: N/A;   ROBOT ASSISTED LAPAROSCOPIC RADICAL PROSTATECTOMY  03/2010    Home Medications    Prior to Admission medications   Medication Sig Start Date End Date Taking?  Authorizing Provider  fluticasone (FLONASE) 50 MCG/ACT nasal spray Place 1 spray into both nostrils daily. 03/15/22  Yes Lynden Oxford Scales, PA-C  Fluticasone-Umeclidin-Vilant (TRELEGY ELLIPTA) 200-62.5-25 MCG/ACT AEPB Inhale 1 puff into the lungs in the morning for 14 days. 03/15/22 03/29/22 Yes Lynden Oxford Scales, PA-C  ipratropium (ATROVENT) 0.06 % nasal spray Place 2 sprays into both nostrils 3 (three) times daily. As needed for nasal congestion, runny nose  03/15/22  Yes Lynden Oxford Scales, PA-C  levofloxacin (LEVAQUIN) 750 MG tablet Take 1 tablet (750 mg total) by mouth daily for 7 days. 03/15/22 03/22/22 Yes Lynden Oxford Scales, PA-C  methylPREDNISolone (MEDROL DOSEPAK) 4 MG TBPK tablet Take 24 mg on day 1, 20 mg on day 2, 16 mg on day 3, 12 mg on day 4, 8 mg on day 5, 4 mg on day 6.  Take all tablets in each row at once, do not spread tablets out throughout the day. 03/15/22  Yes Lynden Oxford Scales, PA-C  acetaminophen (TYLENOL) 325 MG tablet Take 2 tablets (650 mg total) by mouth every 4 (four) hours as needed for headache or mild pain. 08/11/21   Shirley Friar, PA-C  albuterol (PROVENTIL) (2.5 MG/3ML) 0.083% nebulizer solution Take 3 mLs (2.5 mg total) by nebulization every 4 (four) hours as needed for wheezing or shortness of breath (((PLAN B))). 12/22/21   Hunsucker, Bonna Gains, MD  albuterol (VENTOLIN HFA) 108 (90 Base) MCG/ACT inhaler Inhale 2 puffs into the lungs every 4 hours as needed for shortness of breath or wheezing 01/11/22   Hunsucker, Bonna Gains, MD  amLODipine (NORVASC) 5 MG tablet Take 1 tablet (5 mg total) by mouth in the morning and at bedtime. 11/18/21   Camnitz, Will Hassell Done, MD  ASPIRIN 81 PO Take 81 mg by mouth daily.    [provider]  Blood Glucose Monitoring Suppl (TRUE METRIX METER) w/Device KIT Use as directed 01/07/19   Ladell Pier, MD  Budeson-Glycopyrrol-Formoterol (BREZTRI AEROSPHERE) 160-9-4.8 MCG/ACT AERO Inhale 2 puffs into the lungs in the morning and at bedtime. 07/06/21   Hunsucker, Bonna Gains, MD  busPIRone (BUSPAR) 10 MG tablet Take 1 tablet (10 mg total) by mouth 3 (three) times daily. Patient taking differently: Take 10 mg by mouth 3 (three) times daily as needed (anxiety). 09/11/20   Ladell Pier, MD  Cholecalciferol (VITAMIN D PO) Take 1 tablet by mouth daily.    [provider]  diazepam (VALIUM) 10 MG tablet Take 1 tablet (10 mg total) by mouth 60 (sixty) minutes  before procedure for 1 dose. Take with a sip of water, on an empty stomach. 03/01/22   Magnus Sinning, MD  ezetimibe (ZETIA) 10 MG tablet Take 1 tablet (10 mg total) by mouth daily. 11/09/21 11/04/22  Donato Heinz, MD  gabapentin (NEURONTIN) 300 MG capsule take 1 capsule by mouth in the morning, then take 1 at lunch and 2 at bedtime Patient taking differently: Take 300 mg by mouth 3 (three) times daily as needed (Nerve pain). 12/29/20     glucose blood (TRUE METRIX BLOOD GLUCOSE TEST) test strip Use as instructed 01/07/19   Ladell Pier, MD  hydrochlorothiazide (HYDRODIURIL) 25 MG tablet TAKE 1 TABLET BY MOUTH EVERY DAY 02/25/22   Donato Heinz, MD  ipratropium-albuterol (DUONEB) 0.5-2.5 (3) MG/3ML SOLN INHALE 1 VIAL VIA NEBULIZER TWICE A DAY 01/13/22   Ladell Pier, MD  lidocaine (LIDODERM) 5 % Apply 1 patch to affected area daily and leave on for 12  hours at a time.  Remove after 12 hours 01/05/22   Ladell Pier, MD  linaclotide [facility]) 72 MCG capsule Take 1 capsule (72 mcg total) by mouth daily before breakfast. Patient taking differently: Take 72 mcg by mouth daily as needed (constipation). 06/03/21   Esterwood, Amy S, PA-C  metFORMIN (GLUCOPHAGE-XR) 500 MG 24 hr tablet Take 1 tablet (500 mg total) by mouth 2 (two) times daily with a meal. 02/28/22   Ladell Pier, MD  montelukast (SINGULAIR) 10 MG tablet Take 1 tablet (10 mg total) by mouth at bedtime. 01/11/22   Ladell Pier, MD  Nebivolol HCl 20 MG TABS Take 1 tablet (20 mg total) by mouth 2 (two) times daily. 01/11/22   Ladell Pier, MD  pantoprazole (PROTONIX) 40 MG tablet Take 1 tablet (40 mg total) by mouth daily as needed. 07/12/21   Ladell Pier, MD  potassium chloride SA (KLOR-CON M) 20 MEQ tablet Take 1 tablet (20 mEq total) by mouth daily as needed (When hands cramp up). 12/26/21   Elsie Stain, MD  Respiratory Therapy Supplies (FLUTTER) DEVI Use three times a day after  inhaler or nebulizer use 12/10/18   Elsie Stain, MD  rosuvastatin (CRESTOR) 20 MG tablet Take 1 tablet (20 mg total) by mouth daily. 02/02/22   Donato Heinz, MD  silver sulfADIAZINE (SILVADENE) 1 % cream Apply twice a day to affected area 12/17/21   Ladell Pier, MD  traMADol (ULTRAM) 50 MG tablet Take 1 tablet (50 mg total) by mouth every 8 (eight) hours as needed. 01/14/22   Ladell Pier, MD  TRUEplus Lancets 28G MISC Use as directed 01/07/19   Ladell Pier, MD  valsartan (DIOVAN) 320 MG tablet Take 1 tablet (320 mg total) by mouth daily. 09/06/21 09/06/22  Donato Heinz, MD  VENTOLIN HFA 108 (90 Base) MCG/ACT inhaler Inhale 2 puffs into the lungs every 4 (four) hours as needed for wheezing or shortness of breath. 06/14/21   Hunsucker, Bonna Gains, MD    Family History Family History  Problem Relation Age of Onset   Colon cancer Father    Lung cancer Father        was a smoker   Prostate cancer Father    Lung cancer Mother        was a smoker   Asthma Mother    Allergies Brother    Diabetes Maternal Grandmother    Colon polyps Neg Hx    Esophageal cancer Neg Hx    Stomach cancer Neg Hx    Rectal cancer Neg Hx    Social History Social History   Tobacco Use   Smoking status: Every Day    Packs/day: 0.75    Years: 46.00    Total pack years: 34.50    Types: Cigarettes    Start date: 1976   Smokeless tobacco: Never   Tobacco comments:    still smoking 0.5 ppd  Vaping Use   Vaping Use: Never used  Substance Use Topics   Alcohol use: Not Currently    Alcohol/week: 3.0 standard drinks of alcohol    Types: 3 Cans of beer per week    Comment: rarely   Drug use: No   Allergies   Amoxicillin, Lisinopril, Molds & smuts, Robitussin [guaifenesin], and Azithromycin  Review of Systems Review of Systems  Constitutional:  Negative for diaphoresis and fever.  HENT:  Negative for ear pain and sore throat.   Respiratory:  Positive  for cough and  shortness of breath. Negative for hemoptysis, sputum production and wheezing.   Cardiovascular:  Negative for chest pain, claudication, syncope and PND.  Gastrointestinal:  Negative for abdominal pain and vomiting.  Musculoskeletal:  Negative for neck pain.  Skin:  Negative for rash.  Neurological:  Negative for headaches.   Pertinent findings revealed after performing a 14 point review of systems has been noted in the history of present illness.  Physical Exam Triage Vital Signs ED Triage Vitals  Enc Vitals Group     BP 03/12/21 0827 (!) 147/82     Pulse Rate 03/12/21 0827 72     Resp 03/12/21 0827 18     Temp 03/12/21 0827 98.3 F (36.8 C)     Temp Source 03/12/21 0827 Oral     SpO2 03/12/21 0827 98 %     Weight --      Height --      Head Circumference --      Peak Flow --      Pain Score 03/12/21 0826 5     Pain Loc --      Pain Edu? --      Excl. in [place]? --   No data found.  Updated Vital Signs BP 125/77 (BP Location: Left Arm)   Pulse 87   Temp 98.4 F (36.9 C) (Oral)   Resp 16   SpO2 93%   Physical Exam Vitals and nursing note reviewed.  Constitutional:      General: He is not in acute distress.    Appearance: Normal appearance. He is not ill-appearing.  HENT:     Head: Normocephalic and atraumatic.     Salivary Glands: Right salivary gland is not diffusely enlarged or tender. Left salivary gland is not diffusely enlarged or tender.     Right Ear: Ear canal and external ear normal. No drainage. A middle ear effusion is present. There is no impacted cerumen. Tympanic membrane is bulging. Tympanic membrane is not injected or erythematous.     Left Ear: Ear canal and external ear normal. No drainage. A middle ear effusion is present. There is no impacted cerumen. Tympanic membrane is bulging. Tympanic membrane is not injected or erythematous.     Ears:     Comments: Bilateral EACs normal, both TMs bulging with clear fluid    Nose: Rhinorrhea present. No nasal  deformity, septal deviation, signs of injury, nasal tenderness, mucosal edema or congestion. Rhinorrhea is clear.     Right Nostril: Occlusion present. No foreign body, epistaxis or septal hematoma.     Left Nostril: Occlusion present. No foreign body, epistaxis or septal hematoma.     Right Turbinates: Enlarged, swollen and pale.     Left Turbinates: Enlarged, swollen and pale.     Right Sinus: No maxillary sinus tenderness or frontal sinus tenderness.     Left Sinus: No maxillary sinus tenderness or frontal sinus tenderness.     Mouth/Throat:     Lips: Pink. No lesions.     Mouth: Mucous membranes are moist. No oral lesions.     Pharynx: Oropharynx is clear. Uvula midline. No posterior oropharyngeal erythema or uvula swelling.     Tonsils: No tonsillar exudate. 0 on the right. 0 on the left.     Comments: Postnasal drip Eyes:     General: Lids are normal.        Right eye: No discharge.        Left eye: No discharge.  Extraocular Movements: Extraocular movements intact.     Conjunctiva/sclera: Conjunctivae normal.     Right eye: Right conjunctiva is not injected.     Left eye: Left conjunctiva is not injected.  Neck:     Trachea: Trachea and phonation normal.  Cardiovascular:     Rate and Rhythm: Normal rate and regular rhythm.     Pulses: Normal pulses.     Heart sounds: Normal heart sounds. No murmur heard.    No friction rub. No gallop.  Pulmonary:     Effort: Pulmonary effort is normal. No tachypnea, bradypnea, accessory muscle usage, prolonged expiration, respiratory distress or retractions.     Breath sounds: No stridor, decreased air movement or transmitted upper airway sounds. Examination of the right-upper field reveals wheezing. Examination of the left-upper field reveals wheezing. Examination of the right-middle field reveals wheezing. Examination of the left-middle field reveals wheezing. Examination of the right-lower field reveals wheezing. Examination of the  left-lower field reveals wheezing. Wheezing present. No decreased breath sounds, rhonchi or rales.  Chest:     Chest wall: No tenderness.  Musculoskeletal:        General: Normal range of motion.     Cervical back: Normal range of motion and neck supple. Normal range of motion.  Lymphadenopathy:     Cervical: No cervical adenopathy.  Skin:    General: Skin is warm and dry.     Findings: No erythema or rash.  Neurological:     General: No focal deficit present.     Mental Status: He is alert and oriented to person, place, and time.  Psychiatric:        Mood and Affect: Mood normal.        Behavior: Behavior normal.     Visual Acuity Right Eye Distance:   Left Eye Distance:   Bilateral Distance:    Right Eye Near:   Left Eye Near:    Bilateral Near:     [facility] Couse / Diagnostics / Procedures:     Radiology No results found.  Procedures Procedures (including critical care time) EKG  Pending results:  Labs Reviewed - No data to display  Medications Ordered in UC: Medications  methylPREDNISolone sodium succinate (SOLU-MEDROL) 125 mg/2 mL injection 125 mg (125 mg Intramuscular Given 03/15/22 1005)    UC Diagnoses / Final Clinical Impressions(s)   I have reviewed the triage vital signs and the nursing notes.  Pertinent labs & imaging results that were available during my care of the patient were reviewed by me and considered in my medical decision making (see chart for details).    Final diagnoses:  Moderate persistent asthma with acute exacerbation in adult  Acute bacterial sinusitis  Acute suppurative otitis media of right ear without spontaneous rupture of tympanic membrane, recurrence not specified   Patient provided with an injection of Solu-Medrol for acute exacerbation of moderate persistent asthma.  Patient was also provided with prescription for levofloxacin for treatment of left inner ear infection as well as likely bacterial sinusitis given his severe allergy  to penicillin.  Patient provided with a Medrol Dosepak for continued treatment of inflammation in his lungs and to improve his work of breathing.  Patient advised to continue using albuterol 4 times daily on a regular basis for the next several days then decrease to twice daily and as needed for a few more weeks.  Fluticasone and Atrovent nasal sprays provided to reduce nasal congestion and rhinorrhea which is likely perpetuating his cough from postnasal  drip.  Return precautions advised.  ED Prescriptions     Medication Sig Dispense Auth. Provider   methylPREDNISolone (MEDROL DOSEPAK) 4 MG TBPK tablet Take 24 mg on day 1, 20 mg on day 2, 16 mg on day 3, 12 mg on day 4, 8 mg on day 5, 4 mg on day 6.  Take all tablets in each row at once, do not spread tablets out throughout the day. 21 tablet Lynden Oxford Scales, PA-C   ipratropium (ATROVENT) 0.06 % nasal spray Place 2 sprays into both nostrils 3 (three) times daily. As needed for nasal congestion, runny nose 15 mL Lynden Oxford Scales, PA-C   Fluticasone-Umeclidin-Vilant (TRELEGY ELLIPTA) 200-62.5-25 MCG/ACT AEPB Inhale 1 puff into the lungs in the morning for 14 days. 1 each Lynden Oxford Scales, PA-C   levofloxacin (LEVAQUIN) 750 MG tablet Take 1 tablet (750 mg total) by mouth daily for 7 days. 7 tablet Lynden Oxford Scales, PA-C   fluticasone (FLONASE) 50 MCG/ACT nasal spray Place 1 spray into both nostrils daily. 47.4 mL Lynden Oxford Scales, PA-C      PDMP not reviewed this encounter.  Disposition Upon Discharge:  Condition: stable for discharge home Home: take medications as prescribed; routine discharge instructions as discussed; follow up as advised.  Patient presented with an acute illness with associated systemic symptoms and significant discomfort requiring urgent management. In my opinion, this is a condition that a prudent lay person (someone who possesses an average knowledge of health and medicine) may potentially expect  to result in complications if not addressed urgently such as respiratory distress, impairment of bodily function or dysfunction of bodily organs.   Routine symptom specific, illness specific and/or disease specific instructions were discussed with the patient and/or caregiver at length.   As such, the patient has been evaluated and assessed, work-up was performed and treatment was provided in alignment with urgent care protocols and evidence based medicine.  Patient/parent/caregiver has been advised that the patient may require follow up for further testing and treatment if the symptoms continue in spite of treatment, as clinically indicated and appropriate.  If the patient was tested for COVID-19, Influenza and/or RSV, then the patient/parent/guardian was advised to isolate at home pending the results of his/her diagnostic coronavirus test and potentially longer if they're positive. I have also advised pt that if his/her COVID-19 test returns positive, it's recommended to self-isolate for at least 10 days after symptoms first appeared AND until fever-free for 24 hours without fever reducer AND other symptoms have improved or resolved. Discussed self-isolation recommendations as well as instructions for household member/close contacts as per the [facility] and [place] DHHS, and also gave patient the [place] packet with this information.  Patient/parent/caregiver has been advised to return to the [facility] or PCP in 3-5 days if no better; to PCP or the Emergency Department if new signs and symptoms develop, or if the current signs or symptoms continue to change or worsen for further workup, evaluation and treatment as clinically indicated and appropriate  The patient will follow up with their current PCP if and as advised. If the patient does not currently have a PCP we will assist them in obtaining one.   The patient may need specialty follow up if the symptoms continue, in spite of conservative treatment and management,  for further workup, evaluation, consultation and treatment as clinically indicated and appropriate.  Patient/parent/caregiver verbalized understanding and agreement of plan as discussed.  All questions were addressed during visit.  Please see discharge instructions  below for further details of plan.  Discharge Instructions:   Discharge Instructions      Please read below to learn more about the medications, dosages and frequencies that I recommend to help alleviate your symptoms and to get you feeling better soon:   Solu-Medrol IM (methylprednisolone):  To quickly address your significant respiratory inflammation, you were provided with an injection of Solu-Medrol in the office today.  You should continue to feel the full benefit of the steroid for the next 4 to 6 hours.    Medrol Dosepak (methylprednisolone): This is a steroid that will continue to keep your upper and lower airways calm, please take one row of tablets daily with your breakfast meal starting tomorrow morning until the prescription is complete.      Levofloxacin: Take 1 capsule daily for 7 days for acute bacterial sinusitis and early signs of an inner ear infection.  This antibiotic will also eradicate any bacteria that is causing her cough to linger and making your inflammation in your lungs worse.  This antibiotic can cause upset stomach, this will resolve once antibiotics are complete.  You are welcome to use a probiotic, eat yogurt, take Imodium while taking this medication.  Please avoid other systemic medications such as Maalox, Pepto-Bismol or milk of magnesia as they can interfere with your body's ability to absorb the antibiotics.     Zyrtec (cetirizine): Please continue this excellent second-generation antihistamine that helps to reduce respiratory inflammatory response to environmental allergens.  In some patients, this medication can cause daytime sleepiness so I recommend that you take 1 tablet daily at bedtime.      Singulair (montelukast): Please continue this mast cell stabilizer that works well with antihistamines.  Mast cells are responsible for stimulating histamine production so you can imagine that if we can reduce the activity of your mast cells, then fewer histamines will be produced and inflammation caused by allergy exposure will be significantly reduced.  I recommend that you take this medication at the same time you take your antihistamine.   Atrovent (ipratropium): This is an excellent nasal decongestant spray I have added to your regimen that will not cause rebound congestion, please instill 2 sprays into each nare with each use.  Because nasal steroids may be contributing to nosebleeds at this time, I recommend that you use this spray in instead of the nasal steroid prescribed for you for now.      Flonase (fluticasone): Resume this steroid nasal spray once you have finished your antibiotics.        ProAir, Ventolin, Proventil (albuterol): This inhaled medication contains a short acting beta agonist bronchodilator.  This medication works on the smooth muscle that opens and constricts of your airways by relaxing the muscle.  The result of relaxation of the smooth muscle is increased air movement and improved work of breathing.  This is a short acting medication that can be used every 4-6 hours as needed for increased work of breathing, shortness of breath, wheezing and excessive coughing.    Trelegy (fluticasone, vilanterol and umeclidinium):  This inhaled medication contains a corticosteroid and long-acting form of albuterol.  The inhaled steroid and this medication  is not absorbed into the body and will not cause side effects such as increased blood sugar levels, irritability, sleeplessness or weight gain.  Inhaled corticosteroid are sort of like topical steroid creams but, as you can imagine, it is not practical to attempt to rub a steroid cream inside of your lungs.  The  long-acting albuterol works  similarly to the short acting albuterol found in your rescue inhaler but provides 24-hour relaxation of the smooth muscles that open and constrict your airways; your short acting rescue inhaler can only provide for a few hours this benefit for a few hours.  The third unique ingredient, umeclidinium, is an antimuscarinic and works similarly to your albuterol, providing long-acting relaxation of the smooth muscles in your airway.  Please feel free to continue using your short acting rescue inhaler as often as needed throughout the day for shortness of breath, wheezing, and cough.    Please Trelegy in place of Breztri until you are able to resolve your request for financial assistance.  Please speak with your pulmonologist to a different agent if Judithann Sauger is not affordable.  Not taking your inhaled medications as prescribed can increase your risk of more frequent upper respiratory infections, lower respiratory disorders, skin reactions, and eye irritations that may or may not require the use of antibiotics and steroids and can result in loss of time at work, celebrations with family and friends as well as missed social opportunities.   Please follow-up within the next 3-5 days either with your primary care provider or urgent care if your symptoms do not resolve or become worse despite treatment, you may require chest x-ray        Thank you for visiting urgent care today.  We appreciate the opportunity to participate in your care.       This office note has been dictated using Museum/gallery curator.  Unfortunately, this method of dictation can sometimes lead to typographical or grammatical errors.  I apologize for your inconvenience in advance if this occurs.  Please do not hesitate to reach out to me if clarification is needed.      Lynden Oxford Scales, PA-C 03/15/22 1950

## 2022-03-15 NOTE — ED Triage Notes (Signed)
Pt c/o SOB, facial pressure from sinuses and congestion.   Started: about 2 weeks ago   Home interventions: albuterol inhaler, prednisone (last taken this morning)

## 2022-03-15 NOTE — Discharge Instructions (Signed)
Please read below to learn more about the medications, dosages and frequencies that I recommend to help alleviate your symptoms and to get you feeling better soon:   Solu-Medrol IM (methylprednisolone):  To quickly address your significant respiratory inflammation, you were provided with an injection of Solu-Medrol in the office today.  You should continue to feel the full benefit of the steroid for the next 4 to 6 hours.    Medrol Dosepak (methylprednisolone): This is a steroid that will continue to keep your upper and lower airways calm, please take one row of tablets daily with your breakfast meal starting tomorrow morning until the prescription is complete.      Levofloxacin: Take 1 capsule daily for 7 days for acute bacterial sinusitis and early signs of an inner ear infection.  This antibiotic will also eradicate any bacteria that is causing her cough to linger and making your inflammation in your lungs worse.  This antibiotic can cause upset stomach, this will resolve once antibiotics are complete.  You are welcome to use a probiotic, eat yogurt, take Imodium while taking this medication.  Please avoid other systemic medications such as Maalox, Pepto-Bismol or milk of magnesia as they can interfere with your body's ability to absorb the antibiotics.     Zyrtec (cetirizine): Please continue this excellent second-generation antihistamine that helps to reduce respiratory inflammatory response to environmental allergens.  In some patients, this medication can cause daytime sleepiness so I recommend that you take 1 tablet daily at bedtime.     Singulair (montelukast): Please continue this mast cell stabilizer that works well with antihistamines.  Mast cells are responsible for stimulating histamine production so you can imagine that if we can reduce the activity of your mast cells, then fewer histamines will be produced and inflammation caused by allergy exposure will be significantly reduced.  I  recommend that you take this medication at the same time you take your antihistamine.   Atrovent (ipratropium): This is an excellent nasal decongestant spray I have added to your regimen that will not cause rebound congestion, please instill 2 sprays into each nare with each use.  Because nasal steroids may be contributing to nosebleeds at this time, I recommend that you use this spray in instead of the nasal steroid prescribed for you for now.      Flonase (fluticasone): Resume this steroid nasal spray once you have finished your antibiotics.        ProAir, Ventolin, Proventil (albuterol): This inhaled medication contains a short acting beta agonist bronchodilator.  This medication works on the smooth muscle that opens and constricts of your airways by relaxing the muscle.  The result of relaxation of the smooth muscle is increased air movement and improved work of breathing.  This is a short acting medication that can be used every 4-6 hours as needed for increased work of breathing, shortness of breath, wheezing and excessive coughing.    Trelegy (fluticasone, vilanterol and umeclidinium):  This inhaled medication contains a corticosteroid and long-acting form of albuterol.  The inhaled steroid and this medication  is not absorbed into the body and will not cause side effects such as increased blood sugar levels, irritability, sleeplessness or weight gain.  Inhaled corticosteroid are sort of like topical steroid creams but, as you can imagine, it is not practical to attempt to rub a steroid cream inside of your lungs.  The long-acting albuterol works similarly to the short acting albuterol found in your rescue inhaler but provides 24-hour relaxation  of the smooth muscles that open and constrict your airways; your short acting rescue inhaler can only provide for a few hours this benefit for a few hours.  The third unique ingredient, umeclidinium, is an antimuscarinic and works similarly to your albuterol,  providing long-acting relaxation of the smooth muscles in your airway.  Please feel free to continue using your short acting rescue inhaler as often as needed throughout the day for shortness of breath, wheezing, and cough.    Please Trelegy in place of Breztri until you are able to resolve your request for financial assistance.  Please speak with your pulmonologist to a different agent if Judithann Sauger is not affordable.  Not taking your inhaled medications as prescribed can increase your risk of more frequent upper respiratory infections, lower respiratory disorders, skin reactions, and eye irritations that may or may not require the use of antibiotics and steroids and can result in loss of time at work, celebrations with family and friends as well as missed social opportunities.   Please follow-up within the next 3-5 days either with your primary care provider or urgent care if your symptoms do not resolve or become worse despite treatment, you may require chest x-ray        Thank you for visiting urgent care today.  We appreciate the opportunity to participate in your care.

## 2022-03-16 ENCOUNTER — Other Ambulatory Visit: Payer: Self-pay

## 2022-03-16 MED ORDER — TRAMADOL HCL 50 MG PO TABS
50.0000 mg | ORAL_TABLET | Freq: Three times a day (TID) | ORAL | 0 refills | Status: DC | PRN
  Filled 2022-03-16: qty 30, 10d supply, fill #0

## 2022-03-22 ENCOUNTER — Telehealth: Payer: Self-pay | Admitting: Emergency Medicine

## 2022-03-22 DIAGNOSIS — J4550 Severe persistent asthma, uncomplicated: Secondary | ICD-10-CM

## 2022-03-22 DIAGNOSIS — G4733 Obstructive sleep apnea (adult) (pediatric): Secondary | ICD-10-CM

## 2022-03-22 NOTE — Addendum Note (Signed)
Addended by: Karle Plumber B on: 03/22/2022 01:32 PM   Modules accepted: Orders

## 2022-03-22 NOTE — Telephone Encounter (Signed)
Copied from [place] [id number]. Topic: General - Other >> Mar 22, 2022 10:33 AM Cyndi Bender wrote: Reason for CRM: Pt reports that his c-pap machine needs a new outlet adaptor and the nebulizer machine needs filters. Pt stated he was told to contact pcp for a Rx.

## 2022-03-23 NOTE — Telephone Encounter (Signed)
Pt was called and he provided the company information for his CPAP machine, orders were faxed over.

## 2022-03-24 ENCOUNTER — Other Ambulatory Visit: Payer: Self-pay

## 2022-03-24 DIAGNOSIS — J4550 Severe persistent asthma, uncomplicated: Secondary | ICD-10-CM | POA: Diagnosis not present

## 2022-03-24 DIAGNOSIS — G4733 Obstructive sleep apnea (adult) (pediatric): Secondary | ICD-10-CM | POA: Diagnosis not present

## 2022-03-28 ENCOUNTER — Other Ambulatory Visit: Payer: Self-pay

## 2022-03-29 ENCOUNTER — Telehealth: Payer: Self-pay

## 2022-03-29 ENCOUNTER — Other Ambulatory Visit: Payer: Self-pay | Admitting: Internal Medicine

## 2022-03-29 ENCOUNTER — Other Ambulatory Visit: Payer: Self-pay

## 2022-03-29 DIAGNOSIS — Z8546 Personal history of malignant neoplasm of prostate: Secondary | ICD-10-CM

## 2022-03-29 NOTE — Telephone Encounter (Signed)
Patient came in stated that he needs his PSA checked.

## 2022-03-29 NOTE — Telephone Encounter (Signed)
Noted  patient was called and made aware appt made as well

## 2022-03-30 ENCOUNTER — Ambulatory Visit: Payer: No Typology Code available for payment source | Attending: Internal Medicine

## 2022-03-30 DIAGNOSIS — Z8546 Personal history of malignant neoplasm of prostate: Secondary | ICD-10-CM

## 2022-03-31 ENCOUNTER — Other Ambulatory Visit: Payer: Self-pay | Admitting: Internal Medicine

## 2022-03-31 DIAGNOSIS — J4550 Severe persistent asthma, uncomplicated: Secondary | ICD-10-CM

## 2022-03-31 LAB — PSA: Prostate Specific Ag, Serum: 0.2 ng/mL (ref 0.0–4.0)

## 2022-03-31 NOTE — Telephone Encounter (Signed)
Requested medications are due for refill today.  no  Requested medications are on the active medications list.  no  Last refill. Unsure  Future visit scheduled.   yes  Notes to clinic.  Medication discontinued - not on med list. Refill not delegated.    Requested Prescriptions  Pending Prescriptions Disp Refills   predniSONE (DELTASONE) 10 MG tablet [Pharmacy Med Name: PREDNISONE 10 MG TABLET] 10 tablet 0    Sig: Take 3 tablets (30 mg total) by mouth daily as needed (Shortness of breath).     Not Delegated - Endocrinology:  Oral Corticosteroids Failed - 03/31/2022  2:06 PM      Failed - This refill cannot be delegated      Failed - Manual Review: Eye exam for IOP if prolonged treatment      Failed - Glucose (serum) in normal range and within 180 days    Glucose  Date Value Ref Range Status  02/01/2022 108 (H) 70 - 99 mg/dL Final   Glucose, Bld  Date Value Ref Range Status  06/20/2018 109 (H) 70 - 99 mg/dL Final   POCT Glucose [facility])  Date Value Ref Range Status  12/24/2021 106 (A) 70 - 99 mg/dL Final   POC Glucose  Date Value Ref Range Status  12/17/2021 139 (A) 70 - 99 mg/dl Final   Glucose-Capillary  Date Value Ref Range Status  08/11/2021 123 (H) 70 - 99 mg/dL Final    Comment:    Glucose reference range applies only to samples taken after fasting for at least 8 hours.         Failed - Bone Mineral Density or Dexa Scan completed in the last 2 years      85 - K in normal range and within 180 days    Potassium  Date Value Ref Range Status  02/01/2022 3.7 3.5 - 5.2 mmol/L Final         Passed - Na in normal range and within 180 days    Sodium  Date Value Ref Range Status  02/01/2022 142 134 - 144 mmol/L Final         Passed - Last BP in normal range    BP Readings from Last 1 Encounters:  03/15/22 125/77         Passed - Valid encounter within last 6 months    Recent Outpatient Visits           1 month ago Type 2 diabetes mellitus with obesity  ([place])   [place] Ladell Pier, MD   2 months ago Chronic maxillary sinusitis   [place], MD   3 months ago Lumbar radiculopathy   [place], MD   9 months ago Severe persistent asthma with allergic rhinitis without complication   [place], MD   9 months ago Severe persistent asthma without complication   [place], Deborah B, MD       Future Appointments             In 2 weeks Hunsucker, Bonna Gains, MD [facility]   In 3 months Ladell Pier, MD [place]   In 4 months Donato Heinz, MD [place] [facility] [place] [place]

## 2022-04-01 ENCOUNTER — Other Ambulatory Visit: Payer: Self-pay

## 2022-04-04 ENCOUNTER — Telehealth: Payer: Self-pay | Admitting: Internal Medicine

## 2022-04-04 NOTE — Telephone Encounter (Signed)
Unable to refill per protocol, Rx request is too soon. Last refill 01/13/22 for 18m and 3 RF. Will refuse.  Requested Prescriptions  Pending Prescriptions Disp Refills   ipratropium-albuterol (DUONEB) 0.5-2.5 (3) MG/3ML SOLN [Pharmacy Med Name: IPRAT-ALBUT 0.5-3(2.5) MG/3 ML] 90 mL 3    Sig: USE 1 VIAL VIA NEBULIZER TWICE A DAY     Pulmonology:  Combination Products - albuterol / ipratropium Passed - 04/04/2022 10:55 AM      Passed - Last BP in normal range    BP Readings from Last 1 Encounters:  03/15/22 125/77         Passed - Last Heart Rate in normal range    Pulse Readings from Last 1 Encounters:  03/15/22 87         Passed - Valid encounter within last 12 months    Recent Outpatient Visits           1 month ago Type 2 diabetes mellitus with obesity ([place]   [place]   2 months ago Chronic maxillary sinusitis   [place]   3 months ago Lumbar radiculopathy   [place]   9 months ago Severe persistent asthma with allergic rhinitis without complication   [place]   9 months ago Severe persistent asthma without complication   [place], [place]       Future Appointments             In 2 weeks Hunsucker, MBonna Gains MD  Pulmonary Care   In 3 months J[place]adell Pier MD [place]  In 4 months SDonato Heinz MD [place] [facility] [place] [place]

## 2022-04-06 NOTE — Telephone Encounter (Signed)
Routing to PCP for review.

## 2022-04-06 NOTE — Telephone Encounter (Signed)
Medication Refill - Medication: ipratropium (ATROVENT) 0.06 % nasal spray  methylPREDNISolone (MEDROL DOSEPAK) 4 MG TBPK tablet   Levofloxaicn '750Mg'$    Has the patient contacted their pharmacy? Yes.   (Agent: If no, request that the patient contact the pharmacy for the refill. If patient does not wish to contact the pharmacy document the reason why and proceed with request.) (Agent: If yes, when and what did the pharmacy advise?)  Preferred Pharmacy (with phone number or street name): [id number] [place] Has the patient been seen for an appointment in the last year OR does the patient have an upcoming appointment? Yes.    Agent: Please be advised that RX refills may take up to 3 business days. We ask that you follow-up with your pharmacy.

## 2022-04-10 ENCOUNTER — Ambulatory Visit
Admission: EM | Admit: 2022-04-10 | Discharge: 2022-04-10 | Disposition: A | Discharge status: Home / Self Care | Payer: No Typology Code available for payment source | Attending: Urgent Care | Admitting: Urgent Care

## 2022-04-10 ENCOUNTER — Ambulatory Visit: Payer: No Typology Code available for payment source

## 2022-04-10 DIAGNOSIS — J4541 Moderate persistent asthma with (acute) exacerbation: Secondary | ICD-10-CM

## 2022-04-10 DIAGNOSIS — J309 Allergic rhinitis, unspecified: Secondary | ICD-10-CM

## 2022-04-10 MED ORDER — TRIAMCINOLONE ACETONIDE 40 MG/ML IJ SUSP
60.0000 mg | Freq: Once | INTRAMUSCULAR | Status: AC
  Administered 2022-04-10: 60 mg via INTRAMUSCULAR

## 2022-04-10 MED ORDER — IPRATROPIUM BROMIDE 0.06 % NA SOLN: 2.0000 | Freq: Three times a day (TID) | NASAL | 1 refills | Status: DC

## 2022-04-10 NOTE — ED Provider Notes (Signed)
[facility]  Note:  This document was prepared using Systems analyst and may include unintentional dictation errors.  MRN: [id number] DOB: Feb 16, 1956  Subjective:   Joseph Harvey is a 66 y.o. male presenting for recurrent shortness of breath, wheezing chest congestion, coughing.  Patient was last seen 03/15/2022.  He was provided with a Solu-Medrol injection in clinic, this was followed by methylprednisolone Dosepak for an asthma flare and levofloxacin to address bacterial sinusitis, otitis media of the right ear.  Since then he is also gotten more prednisone which he finished.  Reports that he would like to have a refill on the nose spray Atrovent.  He also wants a steroid injection and steroid pills.  He is requesting antibiotic refill.  As far as maintenance medications go, he has been using nebulized albuterol, Breztri daily as well.  Uses Ventolin as well.  Has a follow-up coming up with his pulmonologist on 04/18/2022.  Patient is a smoker.  No current facility-administered medications for this encounter.  Current Outpatient Medications:    acetaminophen (TYLENOL) 325 MG tablet, Take 2 tablets (650 mg total) by mouth every 4 (four) hours as needed for headache or mild pain., Disp: , Rfl:    albuterol (PROVENTIL) (2.5 MG/3ML) 0.083% nebulizer solution, Take 3 mLs (2.5 mg total) by nebulization every 4 (four) hours as needed for wheezing or shortness of breath (((PLAN B)))., Disp: 360 mL, Rfl: 6   albuterol (VENTOLIN HFA) 108 (90 Base) MCG/ACT inhaler, Inhale 2 puffs into the lungs every 4 hours as needed for shortness of breath or wheezing, Disp: 18 g, Rfl: 5   amLODipine (NORVASC) 5 MG tablet, Take 1 tablet (5 mg total) by mouth in the morning and at bedtime., Disp: 180 tablet, Rfl: 1   ASPIRIN 81 PO, Take 81 mg by mouth daily., Disp: , Rfl:    Blood Glucose Monitoring Suppl (TRUE METRIX METER) w/Device KIT, Use as directed, Disp: 1 kit, Rfl: 0    Budeson-Glycopyrrol-Formoterol (BREZTRI AEROSPHERE) 160-9-4.8 MCG/ACT AERO, Inhale 2 puffs into the lungs in the morning and at bedtime., Disp: 10.7 g, Rfl: 11   busPIRone (BUSPAR) 10 MG tablet, Take 1 tablet (10 mg total) by mouth 3 (three) times daily. (Patient taking differently: Take 10 mg by mouth 3 (three) times daily as needed (anxiety).), Disp: 90 tablet, Rfl: 3   Cholecalciferol (VITAMIN D PO), Take 1 tablet by mouth daily., Disp: , Rfl:    diazepam (VALIUM) 10 MG tablet, Take 1 tablet (10 mg total) by mouth 60 (sixty) minutes before procedure for 1 dose. Take with a sip of water, on an empty stomach., Disp: 1 tablet, Rfl: 0   ezetimibe (ZETIA) 10 MG tablet, Take 1 tablet (10 mg total) by mouth daily., Disp: 90 tablet, Rfl: 3   fluticasone (FLONASE) 50 MCG/ACT nasal spray, Place 1 spray into both nostrils daily., Disp: 47.4 mL, Rfl: 1   gabapentin (NEURONTIN) 300 MG capsule, take 1 capsule by mouth in the morning, then take 1 at lunch and 2 at bedtime (Patient taking differently: Take 300 mg by mouth 3 (three) times daily as needed (Nerve pain).), Disp: 360 capsule, Rfl: 3   glucose blood (TRUE METRIX BLOOD GLUCOSE TEST) test strip, Use as instructed, Disp: 100 each, Rfl: 12   hydrochlorothiazide (HYDRODIURIL) 25 MG tablet, TAKE 1 TABLET BY MOUTH EVERY DAY, Disp: 90 tablet, Rfl: 1   ipratropium (ATROVENT) 0.06 % nasal spray, Place 2 sprays into both nostrils 3 (three) times  daily. As needed for nasal congestion, runny nose, Disp: 15 mL, Rfl: 1   ipratropium-albuterol (DUONEB) 0.5-2.5 (3) MG/3ML SOLN, INHALE 1 VIAL VIA NEBULIZER TWICE A DAY, Disp: 90 mL, Rfl: 3   lidocaine (LIDODERM) 5 %, Apply 1 patch to affected area daily and leave on for 12 hours at a time.  Remove after 12 hours, Disp: 30 patch, Rfl: 0   linaclotide (LINZESS) 72 MCG capsule, Take 1 capsule (72 mcg total) by mouth daily before breakfast. (Patient taking differently: Take 72 mcg by mouth daily as needed (constipation).), Disp:  30 capsule, Rfl: 6   metFORMIN (GLUCOPHAGE-XR) 500 MG 24 hr tablet, Take 1 tablet (500 mg total) by mouth 2 (two) times daily with a meal., Disp: 60 tablet, Rfl: 6   methylPREDNISolone (MEDROL DOSEPAK) 4 MG TBPK tablet, Take 24 mg on day 1, 20 mg on day 2, 16 mg on day 3, 12 mg on day 4, 8 mg on day 5, 4 mg on day 6.  Take all tablets in each row at once, do not spread tablets out throughout the day., Disp: 21 tablet, Rfl: 0   montelukast (SINGULAIR) 10 MG tablet, Take 1 tablet (10 mg total) by mouth at bedtime., Disp: 30 tablet, Rfl: 3   Nebivolol HCl 20 MG TABS, Take 1 tablet (20 mg total) by mouth 2 (two) times daily., Disp: 60 tablet, Rfl: 2   pantoprazole (PROTONIX) 40 MG tablet, Take 1 tablet (40 mg total) by mouth daily as needed., Disp: 30 tablet, Rfl: 2   potassium chloride SA (KLOR-CON M) 20 MEQ tablet, Take 1 tablet (20 mEq total) by mouth daily as needed (When hands cramp up)., Disp: 60 tablet, Rfl: 2   predniSONE (DELTASONE) 10 MG tablet, TAKE 3 TABLETS (30 MG TOTAL) BY MOUTH DAILY AS NEEDED (SHORTNESS OF BREATH)., Disp: 15 tablet, Rfl: 0   Respiratory Therapy Supplies (FLUTTER) DEVI, Use three times a day after inhaler or nebulizer use, Disp: 1 each, Rfl: 0   rosuvastatin (CRESTOR) 20 MG tablet, Take 1 tablet (20 mg total) by mouth daily., Disp: 90 tablet, Rfl: 3   silver sulfADIAZINE (SILVADENE) 1 % cream, Apply twice a day to affected area, Disp: 50 g, Rfl: 0   traMADol (ULTRAM) 50 MG tablet, Take 1 tablet (50 mg total) by mouth every 8 (eight) hours as needed., Disp: 30 tablet, Rfl: 0   TRUEplus Lancets 28G MISC, Use as directed, Disp: 100 each, Rfl: 4   valsartan (DIOVAN) 320 MG tablet, Take 1 tablet (320 mg total) by mouth daily., Disp: 90 tablet, Rfl: 3   VENTOLIN HFA 108 (90 Base) MCG/ACT inhaler, Inhale 2 puffs into the lungs every 4 (four) hours as needed for wheezing or shortness of breath., Disp: 18 g, Rfl: 6   Allergies  Allergen Reactions   Amoxicillin Anaphylaxis    Has  patient had a PCN reaction causing immediate rash, facial/tongue/throat swelling, SOB or lightheadedness with hypotension: Yes Has patient had a PCN reaction causing severe rash involving mucus membranes or skin necrosis: No Has patient had a PCN reaction that required hospitalization: Yes Has patient had a PCN reaction occurring within the last 10 years: Yes If all of the above answers are "NO", then may proceed with Cephalosporin use.    Lisinopril Shortness Of Breath   Molds & Smuts Anaphylaxis   Robitussin [Guaifenesin] Shortness Of Breath    wheezing   Azithromycin Other (See Comments)    Past Medical History:  Diagnosis Date   Allergy  Dust, mold, dust mites   Anemia    Asthma    Cancer ([place])    prostate   Cataract    bilateral repair.   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   Pneumonia      Past Surgical History:  Procedure Laterality Date   BACK SURGERY  2017   CATARACT EXTRACTION Bilateral    COLONOSCOPY     2013   COLONOSCOPY  10/03/2019   KNEE ARTHROSCOPY  2005   right   PROSTATE SURGERY     PVC ABLATION N/A 08/10/2021   Procedure: PVC ABLATION;  Surgeon: Constance Haw, MD;  Location: [facility];  Service: Cardiovascular;  Laterality: N/A;   ROBOT ASSISTED LAPAROSCOPIC RADICAL PROSTATECTOMY  03/2010    Family History  Problem Relation Age of Onset   Colon cancer Father    Lung cancer Father        was a smoker   Prostate cancer Father    Lung cancer Mother        was a smoker   Asthma Mother    Allergies Brother    Diabetes Maternal Grandmother    Colon polyps Neg Hx    Esophageal cancer Neg Hx    Stomach cancer Neg Hx    Rectal cancer Neg Hx     Social History   Tobacco Use   Smoking status: Every Day    Packs/day: 0.75    Years: 46.00    Total pack years: 34.50    Types: Cigarettes    Start  date: 1976   Smokeless tobacco: Never   Tobacco comments:    still smoking 0.5 ppd  Vaping Use   Vaping Use: Never used  Substance Use Topics   Alcohol use: Not Currently    Alcohol/week: 3.0 standard drinks of alcohol    Types: 3 Cans of beer per week   Drug use: No    ROS   Objective:   Vitals: BP (!) 143/81 (BP Location: Right Arm)   Pulse 82   Temp 98.8 F (37.1 C) (Oral)   Resp 18   SpO2 95%   Physical Exam Constitutional:      General: He is not in acute distress.    Appearance: Normal appearance. He is well-developed. He is not ill-appearing, toxic-appearing or diaphoretic.  HENT:     Head: Normocephalic and atraumatic.     Right Ear: External ear normal.     Left Ear: External ear normal.     Nose: Nose normal.     Mouth/Throat:     Mouth: Mucous membranes are moist.  Eyes:     General: No scleral icterus.       Right eye: No discharge.        Left eye: No discharge.     Extraocular Movements: Extraocular movements intact.  Cardiovascular:     Rate and Rhythm: Normal rate and regular rhythm.     Heart sounds: Normal heart sounds. No murmur heard.    No friction rub. No gallop.  Pulmonary:     Effort: Pulmonary effort is normal. No respiratory distress.     Breath sounds: No stridor. Examination of the right-upper field reveals wheezing and rhonchi. Examination of the left-upper field reveals wheezing and rhonchi. Examination of the right-middle field reveals wheezing and rhonchi.  Examination of the left-middle field reveals wheezing and rhonchi. Examination of the right-lower field reveals wheezing and rhonchi. Examination of the left-lower field reveals wheezing and rhonchi. Wheezing and rhonchi present. No decreased breath sounds or rales.  Neurological:     Mental Status: He is alert and oriented to person, place, and time.  Psychiatric:        Mood and Affect: Mood normal.        Behavior: Behavior normal.        Thought Content: Thought content  normal.    IM triamcinolone at 60 mg.  Assessment and Plan :   PDMP not reviewed this encounter.  1. Moderate persistent asthma with acute exacerbation   2. Allergic rhinitis, unspecified seasonality, unspecified trigger     Had an extensive discussion with patient about the need for a chest x-ray but he refused.  States that he wants more steroids and would like levofloxacin again.  I refused the levofloxacin as this increases his risk for tendon rupture especially in the context of using so many steroids within the past month.  Furthermore, it would be prudent to avoid repeat antibiotics without establishing the need for them through a chest x-ray which the patient refused consistently.  He states he prefers to follow-up with his pulmonologist.  I refilled his Atrovent nasal spray.  He does not need any refills of his other asthma medications.  Advise maintaining the nebulized albuterol.  Patient did a COVID test at home and was negative.  Refused to repeat in clinic.  Maintain all other regular medications.  Use supportive care otherwise. Counseled patient on potential for adverse effects with medications prescribed/recommended today, ER and return-to-clinic precautions discussed, patient verbalized understanding.    Jaynee Eagles, [place] 04/10/22 [phone]

## 2022-04-10 NOTE — ED Triage Notes (Signed)
Pt c/o SHOB, congestion, cough-states he was seen here for same-felt better but now feeling worse-NAD-steady gait

## 2022-04-12 ENCOUNTER — Other Ambulatory Visit: Payer: Self-pay | Admitting: Internal Medicine

## 2022-04-12 ENCOUNTER — Encounter: Payer: Self-pay | Admitting: Internal Medicine

## 2022-04-12 DIAGNOSIS — M5416 Radiculopathy, lumbar region: Secondary | ICD-10-CM

## 2022-04-12 DIAGNOSIS — J4550 Severe persistent asthma, uncomplicated: Secondary | ICD-10-CM

## 2022-04-13 MED ORDER — TRAMADOL HCL 50 MG PO TABS
50.0000 mg | ORAL_TABLET | Freq: Three times a day (TID) | ORAL | 0 refills | Status: DC | PRN
  Filled 2022-04-13: qty 30, 10d supply, fill #0

## 2022-04-14 ENCOUNTER — Other Ambulatory Visit: Payer: Self-pay

## 2022-04-18 ENCOUNTER — Ambulatory Visit (INDEPENDENT_AMBULATORY_CARE_PROVIDER_SITE_OTHER): Payer: No Typology Code available for payment source | Admitting: Pulmonary Disease

## 2022-04-18 ENCOUNTER — Encounter: Payer: Self-pay | Admitting: Pulmonary Disease

## 2022-04-18 VITALS — BP 128/74 | HR 75 | Wt 239.8 lb

## 2022-04-18 DIAGNOSIS — R0609 Other forms of dyspnea: Secondary | ICD-10-CM

## 2022-04-18 DIAGNOSIS — J4550 Severe persistent asthma, uncomplicated: Secondary | ICD-10-CM | POA: Diagnosis not present

## 2022-04-18 MED ORDER — TRELEGY ELLIPTA 200-62.5-25 MCG/ACT IN AEPB: 1.0000 | INHALATION_SPRAY | Freq: Every day | RESPIRATORY_TRACT | 0 refills | Status: DC

## 2022-04-18 MED ORDER — PREDNISONE 10 MG PO TABS: ORAL_TABLET | ORAL | 0 refills | Status: DC

## 2022-04-18 NOTE — Patient Instructions (Signed)
Nice to see you again  You are wheezing today  I sent in some prednisone for you to use  I also provided some samples of Trelegy, 1 puff once a day.  Rinse her mouth out with water after every use  Lets see how much the [place] cost of the new year.  I think it is okay to continue this if we can get it at a reasonable price.  We can also see about other options if the Judithann Sauger is too expensive on the new insurance plan.  Return to clinic in 3 months or sooner as needed with Dr. Silas Flood

## 2022-04-18 NOTE — Addendum Note (Signed)
Addended by: Monna Fam L on: 04/18/2022 04:09 PM   Modules accepted: Orders

## 2022-04-18 NOTE — Progress Notes (Signed)
Patient ID: Joseph Harvey, male    DOB: 08-25-1955, 66 y.o.   MRN: [id number]  Chief Complaint  Patient presents with   Follow-up    Pt is here for follow up for asthma. Pt states that he is not using the Breztri due to the cost. Pt states Dr Mendel Ryder gave him samples of Trelegy. Pt states the trelegy worked well for him. Ipratropium bromide nasal spray is working better so far. He states that the trelegy did work well for him when he had those samples.     Referring provider: Ladell Pier, MD  HPI:   Joseph Harvey is a 66 y.o. man whom we are seeing in follow-up for dyspnea exertion, asthma.  ED note 03/04/2022 and 04/04/2022 reviewed.  Most recent PCP note reviewed.  Seems his wheezing, shortness of breath is worse over the last couple months.  Couple ED visits.  Given steroids with improvement temporarily.  He is wheezing today.  More shortness of breath with activity.  Discussed given his wheeze likely sign of poorly controlled asthma.  Was without inhaler for a while due to cost.  Getting samples now.  He is in the donut hole.  Triple inhaled therapy has historically improved his symptoms.  Currently on Trelegy with improvement.  HPI at initial visit: [name] last in 2016.  At last visit symptoms are well controlled with Symbicort.  Unclear exactly what occurred in the last 5 years.  But over the last several months he endorses worsening dyspnea on exertion.  Just walking around going to the gym he has become short of breath.  Endorses severe shortness of breath.  This is resolved within a minute or 2 of albuterol administration.  In general his cough is much better than prior.  Suspect he is adhered well to Dr. Gustavus Bryant instructions regarding his airway cough syndrome.  He does feel like he has significant nasal congestion and postnasal drip.  This produces mucus in the back of her throat needs to clear her cough up but feels much different than his prior cough.   Scheduled to have sinus surgery but this was discontinued due to wheezing per his report.  His insurance is changed and now needs to find a new ENT doctor.  When he gets bad bouts of that mucus buildup he will take a dose of prednisone and within a minute, instantly the mucus feels better.  In terms of his dyspnea, rest improves his dyspnea.  There is no other aggravating or alleviating factors.  He has been on Breo for the last several months and says he thinks it helps somewhat with his breathing but is not at the level it was when he was followed by pulmonary prior.  Prior PFTs reviewed and interpreted as suggestive of mild restriction on spirometry, no fixed obstruction, lung volumes revealed TLC of 86% predicted, within normal limits or mildly reduced.  DLCO within normal limits.  Most recent chest x-ray 06/2018 reviewed instructed is clear lungs, current hyperinflation on PA film does not appear hyperinflated on lateral film.  PMH: Anxiety, obesity, diabetes, tobacco abuse, prostate cancer Surgical history: Lumbar back surgery Family History: Father with colon and lung cancer, mother with lung cancer Social history: Grew up in [place], lived there for 39 years, current smoker, 20+ pack year history   Questionaires / Pulmonary Flowsheets:   ACT:  Asthma Control Test ACT Total Score  04/18/2022  8:32 AM 17  03/10/2021  9:51 AM 13  02/26/2020  9:23 AM 9    MMRC:     No data to display          Epworth:      No data to display          Tests:   FENO:  No results found for: "NITRICOXIDE"  PFT:    Latest Ref Rng & Units 05/26/2021    8:14 AM 02/28/2020   10:53 AM  PFT Results  FVC-Pre L 2.31  2.06   FVC-Predicted Pre % 59  52   FVC-Post L 2.28  2.30   FVC-Predicted Post % 59  59   Pre FEV1/FVC % % 73  70   Post FEV1/FCV % % 72  74   FEV1-Pre L 1.69  1.43   FEV1-Predicted Pre % 56  47   FEV1-Post L 1.65  1.70   DLCO uncorrected ml/min/mmHg 19.07  21.37   DLCO  [facility]% % 71  80   DLCO corrected ml/min/mmHg 19.07  22.15   DLCO COR %Predicted % 71  82   DLVA Predicted % 99  123   Personally reviewed and interpreted as mixed restrictive and obstructive physiology with gas trapping, normal DLCO, repeat in 05/2021 showed no longer bronchodilator response likely consistent with better controlled asthma, spirometry overall stable  WALK:      No data to display          Imaging: Reviewed as per EMR  Lab Results: Personally reviewed, no significant elevation of eosinophils, IgE and RAST panel negative in past CBC    Component Value Date/Time   WBC 12.2 (H) 12/17/2021 1526   WBC 8.7 02/26/2020 1007   RBC 4.41 12/17/2021 1526   RBC 4.99 02/26/2020 1007   HGB 14.1 12/17/2021 1526   HCT 40.6 12/17/2021 1526   PLT 345 12/17/2021 1526   MCV 92 12/17/2021 1526   MCH 32.0 12/17/2021 1526   MCH 31.3 06/20/2018 0309   MCHC 34.7 12/17/2021 1526   MCHC 35.0 02/26/2020 1007   RDW 13.0 12/17/2021 1526   LYMPHSABS 3.2 02/26/2020 1007   MONOABS 0.7 02/26/2020 1007   EOSABS 0.1 02/26/2020 1007   BASOSABS 0.2 (H) 02/26/2020 1007    BMET    Component Value Date/Time   NA 142 02/01/2022 0917   K 3.7 02/01/2022 0917   CL 101 02/01/2022 0917   CO2 28 02/01/2022 0917   GLUCOSE 108 (H) 02/01/2022 0917   GLUCOSE 109 (H) 06/20/2018 0309   BUN 14 02/01/2022 0917   CREATININE 1.22 02/01/2022 0917   CALCIUM 9.2 02/01/2022 0917   GFRNONAA 59 (L) 04/14/2020 1036   GFRAA 68 04/14/2020 1036    BNP    Component Value Date/Time   BNP 15.1 09/24/2021 0934   BNP 25.1 07/24/2014 1256    ProBNP    Component Value Date/Time   PROBNP 336 (H) 09/16/2021 1422       Allergies  Allergen Reactions   Amoxicillin Anaphylaxis    Has patient had a PCN reaction causing immediate rash, facial/tongue/throat swelling, SOB or lightheadedness with hypotension: Yes Has patient had a PCN reaction causing severe rash involving mucus membranes or skin necrosis: No Has  patient had a PCN reaction that required hospitalization: Yes Has patient had a PCN reaction occurring within the last 10 years: Yes If all of the above answers are "NO", then may proceed with Cephalosporin use.    Lisinopril Shortness Of Breath   Molds & Smuts Anaphylaxis   Robitussin [Guaifenesin]  Shortness Of Breath    wheezing   Azithromycin Other (See Comments)    Immunization History  Administered Date(s) Administered   DTaP 03/16/2012   Influenza Split 02/07/2013, 02/14/2015, 01/21/2016   Influenza Whole 04/15/2012, 02/10/2020   Influenza, High Dose Seasonal PF 01/28/2019, 12/23/2021   Influenza, Seasonal, Injecte, Preservative Fre 02/07/2013, 02/14/2015, 01/21/2016, 01/27/2017   Influenza,inj,Quad PF,6+ Mos 01/28/2019   Influenza,inj,Quad PF,6-35 Mos 02/08/2021   Influenza,trivalent, recombinat, inj, PF 02/07/2013, 02/14/2015   Influenza-Unspecified 02/07/2013, 02/07/2013, 02/14/2015, 02/14/2015, 01/21/2016, 01/21/2016, 12/22/2016, 12/22/2016, 01/27/2017, 01/27/2017   PFIZER(Purple Top)SARS-COV-2 Vaccination 07/29/2019, 08/19/2019, 01/07/2020, 03/17/2021   PNEUMOCOCCAL CONJUGATE-20 12/08/2021   Pneumococcal Conjugate-13 03/16/2015   Pneumococcal Polysaccharide-23 03/16/2012, 05/16/2013   Tdap 12/19/2016   Zoster Recombinat (Shingrix) 12/19/2016, 02/27/2017    Past Medical History:  Diagnosis Date   Allergy    Dust, mold, dust mites   Anemia    Asthma    Cancer ([place])    prostate   Cataract    bilateral repair.   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   albuterol (PROVENTIL) (2.5 MG/3ML) 0.083% nebulizer solution Take 3 mLs (2.5 mg total) by nebulization every 4 (four) hours as needed for wheezing or shortness of breath (((PLAN B))).   albuterol (VENTOLIN HFA) 108 (90 Base) MCG/ACT inhaler Inhale 2 puffs into the lungs every 4 hours as needed for shortness of breath or wheezing   amLODipine (NORVASC) 5 MG tablet Take 1 tablet (5 mg total) by mouth in the morning and at bedtime.   ASPIRIN 81 PO Take 81 mg by mouth daily.   Blood Glucose Monitoring Suppl (TRUE METRIX METER) w/Device KIT Use as directed   busPIRone (BUSPAR) 10 MG tablet Take 1 tablet (10 mg total) by mouth 3 (three) times daily. (Patient taking differently: Take 10 mg by mouth 3 (three) times daily as needed (anxiety).)   Cholecalciferol (VITAMIN D PO) Take 1 tablet by mouth daily.   diazepam (VALIUM) 10 MG tablet Take 1 tablet (10 mg total) by mouth 60 (sixty) minutes before procedure for 1 dose. Take with a sip of water, on an empty stomach.   ezetimibe (ZETIA) 10 MG tablet Take 1 tablet (10 mg total) by mouth daily.   fluticasone (FLONASE) 50 MCG/ACT nasal spray Place 1 spray into both nostrils daily.   gabapentin (NEURONTIN) 300 MG capsule take 1 capsule by mouth in the morning, then take 1 at lunch and 2 at bedtime (Patient taking differently: Take 300 mg by mouth 3 (three) times daily as needed (Nerve pain).)   glucose blood (TRUE METRIX BLOOD GLUCOSE TEST) test strip Use as instructed   hydrochlorothiazide (HYDRODIURIL) 25 MG tablet TAKE 1 TABLET  BY MOUTH EVERY DAY   ipratropium (ATROVENT) 0.06 % nasal spray Place 2 sprays into both nostrils 3 (three) times daily. As needed for nasal  congestion, runny nose   ipratropium-albuterol (DUONEB) 0.5-2.5 (3) MG/3ML SOLN INHALE 1 VIAL VIA NEBULIZER TWICE A DAY   lidocaine (LIDODERM) 5 % Apply 1 patch to affected area daily and leave on for 12 hours at a time.  Remove after 12 hours   linaclotide (LINZESS) 72 MCG capsule Take 1 capsule (72 mcg total) by mouth daily before breakfast. (Patient taking differently: Take 72 mcg by mouth daily as needed (constipation).)   metFORMIN (GLUCOPHAGE-XR) 500 MG 24 hr tablet Take 1 tablet (500 mg total) by mouth 2 (two) times daily with a meal.   montelukast (SINGULAIR) 10 MG tablet Take 1 tablet (10 mg total) by mouth at bedtime.   Nebivolol HCl 20 MG TABS Take 1 tablet (20 mg total) by mouth 2 (two) times daily.   pantoprazole (PROTONIX) 40 MG tablet Take 1 tablet (40 mg total) by mouth daily as needed.   potassium chloride SA (KLOR-CON M) 20 MEQ tablet Take 1 tablet (20 mEq total) by mouth daily as needed (When hands cramp up).   predniSONE (DELTASONE) 10 MG tablet Take 10 to 30 mg as needed for chest tightness or wheeze or worsening shortness of breath   Respiratory Therapy Supplies (FLUTTER) DEVI Use three times a day after inhaler or nebulizer use   rosuvastatin (CRESTOR) 20 MG tablet Take 1 tablet (20 mg total) by mouth daily.   silver sulfADIAZINE (SILVADENE) 1 % cream Apply twice a day to affected area   traMADol (ULTRAM) 50 MG tablet Take 1 tablet (50 mg total) by mouth every 8 (eight) hours as needed.   TRUEplus Lancets 28G MISC Use as directed   valsartan (DIOVAN) 320 MG tablet Take 1 tablet (320 mg total) by mouth daily.   [DISCONTINUED] methylPREDNISolone (MEDROL DOSEPAK) 4 MG TBPK tablet Take 24 mg on day 1, 20 mg on day 2, 16 mg on day 3, 12 mg on day 4, 8 mg on day 5, 4 mg on day 6.  Take all tablets in each row at once, do not spread tablets out throughout the day.   [DISCONTINUED] predniSONE (DELTASONE) 10 MG tablet TAKE 3 TABLETS (30 MG TOTAL) BY MOUTH DAILY AS NEEDED (SHORTNESS OF  BREATH).   [DISCONTINUED] VENTOLIN HFA 108 (90 Base) MCG/ACT inhaler Inhale 2 puffs into the lungs every 4 (four) hours as needed for wheezing or shortness of breath.   Budeson-Glycopyrrol-Formoterol (BREZTRI AEROSPHERE) 160-9-4.8 MCG/ACT AERO Inhale 2 puffs into the lungs in the morning and at bedtime. (Patient not taking: Reported on 04/18/2022)   No facility-administered encounter medications on file as of 04/18/2022.     Review of Systems  Review of Systems  N/a Physical Exam  BP 128/74 (BP Location: Left Arm, Patient Position: Sitting, Cuff Size: Normal)   Pulse 75   Wt 239 lb 12.8 oz (108.8 kg)   SpO2 99%   BMI 35.41 kg/m   Wt Readings from Last 5 Encounters:  04/18/22 239 lb 12.8 oz (108.8 kg)  02/28/22 242 lb 3.2 oz (109.9 kg)  02/23/22 240 lb (108.9 kg)  02/01/22 240 lb 12.8 oz (109.2 kg)  01/19/22 239 lb (108.4 kg)    BMI Readings from Last 5 Encounters:  04/18/22 35.41 kg/m  02/28/22 35.77 kg/m  02/23/22 35.44 kg/m  02/01/22 35.56 kg/m  01/19/22 35.29 kg/m     Physical Exam General:  Well-appearing, sitting up in exam chair Eyes: EOMI, icterus Neck: No JVP appreciated, neck supple, Respiratory: expiratory wheeze in bilateral mid to lower lung fields, clear at the top, normal work of breathing  cardiovascular: Regular rate, regular rhythm, no murmurs Abdomen: Nondistended, bowel sounds present MSK: No joint effusion, no synovitis Neuro: Normal gait, no weakness Psych: Normal mood, full affect    Assessment & Plan:   Dyspnea on exertion: Suspect multifactorial.  Asthma as a contributor.   Further discussion as below regarding asthma.  Other contributors include suspected anxiety/hyperventilation syndrome, obesity, deconditioning, PVCs.  Symptoms have improved since PVC ablation.   Asthma: He has atopic symptoms.  Improved with triple inhaled therapy in the past.  Intermittently has access to this now in the donut hole, medicine too expensive.  Trelegy  samples provided today.  Prednisone prescribed today given wheeze, poorly controlled asthma.  Discussed the role for biologic therapy and poorly controlled asthma.  He is currently smoking so not a candidate.  Encouraged to stop smoking.   Return in about 3 months (around 07/18/2022).   Lanier Clam, MD 04/18/2022  I spent 41 minutes in the care of the patient including review, coordination of care.

## 2022-04-21 ENCOUNTER — Other Ambulatory Visit: Payer: Self-pay | Admitting: Internal Medicine

## 2022-04-21 ENCOUNTER — Other Ambulatory Visit: Payer: Self-pay

## 2022-04-21 DIAGNOSIS — I152 Hypertension secondary to endocrine disorders: Secondary | ICD-10-CM

## 2022-04-21 IMAGING — MR MR CARD MORPHOLOGY WO/W CM
45 of 48 series · 45 of 48 positions shown · IV contrast (Contrast agent)
Comparison: none

CLINICAL DATA: 64M evaluate for HCM

EXAM:
CARDIAC MRI
TECHNIQUE: The patient was scanned on a 1.5 Tesla Siemens magnet. A dedicated
cardiac coil was used. Functional imaging was done using [employer]iest[employer]
sequences. [DATE], and 4 chamber views were done to assess for RWMA's.
Modified Chelsie rule using a short axis stack was used to
calculate an ejection fraction on a dedicated work station using
Circle software. The patient received 10 cc of Gadavist. After 10
minutes inversion recovery sequences were used to assess for
infiltration and scar tissue.
CONTRAST:  10 cc  of Gadavist

[Series 4: [id number] · axial · 8.0mm · 1.48mm/px · 1 of 18 slices shown]
[im 1/18]
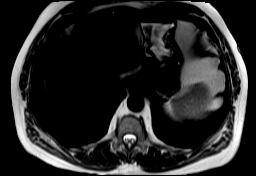

[Series 8: bSSFP · oblique · 8.0mm · 1.61mm/px · 1 of 25 slices shown (1 of 26)]
[im 1/25]
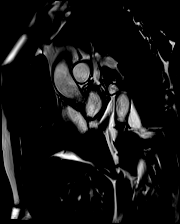

[Series 9: bSSFP · oblique · 8.0mm · 1.61mm/px · 1 of 25 slices shown (2 of 26)]
[im 1/25]
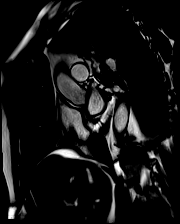

[Series 10: bSSFP · oblique · 8.0mm · 1.61mm/px · 1 of 25 slices shown (3 of 26)]
[im 1/25]
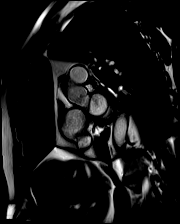

[Series 11: bSSFP · oblique · 8.0mm · 1.61mm/px · 1 of 25 slices shown (4 of 26)]
[im 1/25]
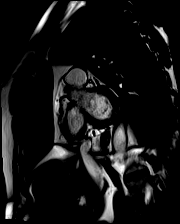

[Series 12: bSSFP · oblique · 8.0mm · 1.61mm/px · 1 of 25 slices shown (5 of 26)]
[im 1/25]
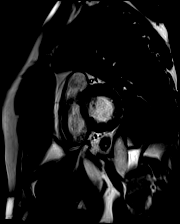

[Series 13: bSSFP · oblique · 8.0mm · 1.61mm/px · 1 of 25 slices shown (6 of 26)]
[im 1/25]
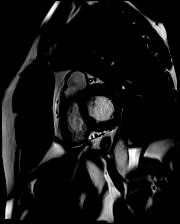

[Series 14: bSSFP · oblique · 8.0mm · 1.61mm/px · 1 of 25 slices shown (7 of 26)]
[im 1/25]
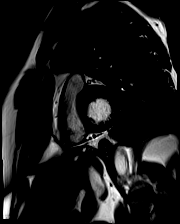

[Series 15: bSSFP · oblique · 8.0mm · 1.61mm/px · 1 of 25 slices shown (8 of 26)]
[im 1/25]
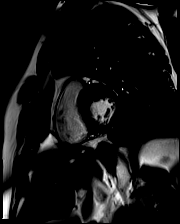

[Series 16: bSSFP · oblique · 8.0mm · 1.61mm/px · 1 of 25 slices shown (9 of 26)]
[im 1/25]
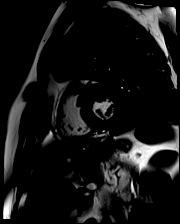

[Series 17: bSSFP · oblique · 8.0mm · 1.61mm/px · 1 of 25 slices shown (10 of 26)]
[im 1/25]
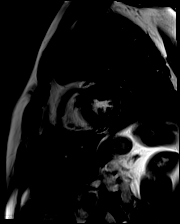

[Series 18: bSSFP · oblique · 8.0mm · 1.61mm/px · 1 of 25 slices shown (11 of 26)]
[im 1/25]
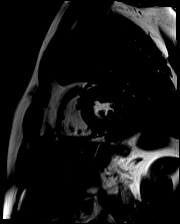

[Series 19: bSSFP · oblique · 8.0mm · 1.61mm/px · 1 of 25 slices shown (12 of 26)]
[im 1/25]
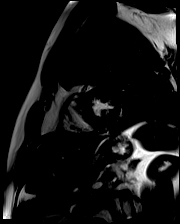

[Series 20: bSSFP · oblique · 8.0mm · 1.61mm/px · 1 of 25 slices shown (13 of 26)]
[im 1/25]
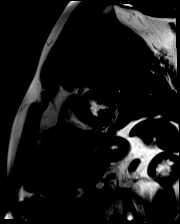

[Series 21: bSSFP · oblique · 8.0mm · 1.61mm/px · 1 of 25 slices shown (14 of 26)]
[im 1/25]
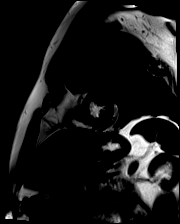

[Series 22: bSSFP · oblique · 8.0mm · 1.61mm/px · 1 of 25 slices shown (15 of 26)]
[im 1/25]
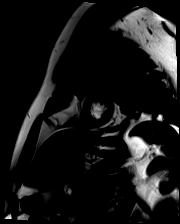

[Series 23: bSSFP · oblique · 8.0mm · 1.61mm/px · 1 of 25 slices shown (16 of 26)]
[im 1/25]
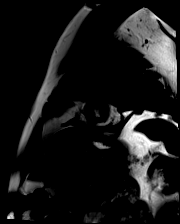

[Series 24: bSSFP · oblique · 8.0mm · 1.61mm/px · 1 of 25 slices shown (17 of 26)]
[im 1/25]
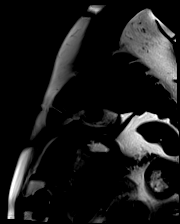

[Series 25: bSSFP · oblique · 8.0mm · 1.61mm/px · 1 of 25 slices shown (18 of 26)]
[im 1/25]
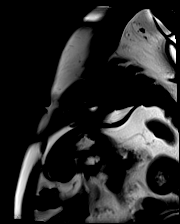

[Series 26: bSSFP · oblique · 8.0mm · 1.61mm/px · 1 of 25 slices shown (19 of 26)]
[im 1/25]
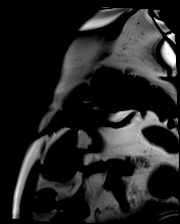

[Series 27: bSSFP · oblique · 8.0mm · 1.61mm/px · 1 of 25 slices shown (20 of 26)]
[im 1/25]
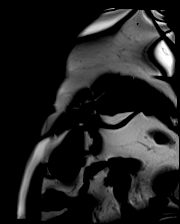

[Series 28: bSSFP · oblique · 6.0mm · 1.41mm/px · 1 of 25 slices shown (21 of 26)]
[im 1/25]
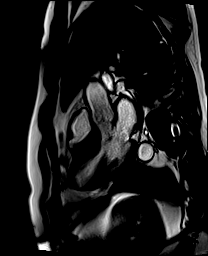

[Series 29: bSSFP · oblique · 6.0mm · 1.41mm/px · 1 of 25 slices shown (22 of 26)]
[im 1/25]
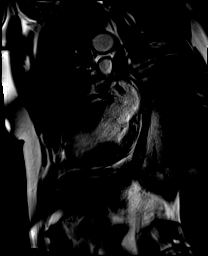

[Series 30: bSSFP · axial · 6.0mm · 1.41mm/px · 1 of 25 slices shown (23 of 26)]
[im 1/25]
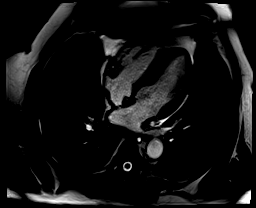

[Series 31: bSSFP · oblique · 6.0mm · 1.41mm/px · 1 of 25 slices shown (24 of 26)]
[im 1/25]
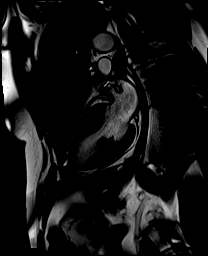

[Series 32: bSSFP · oblique · 6.0mm · 1.41mm/px · 1 of 25 slices shown (25 of 26)]
[im 1/25]
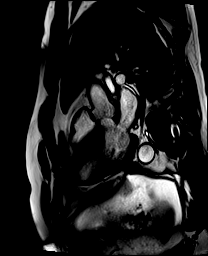

[Series 33: (id)_long_t1 · oblique · 8.0mm · 1.56mm/px · 1 of 24 slices shown]
[im 1/24]
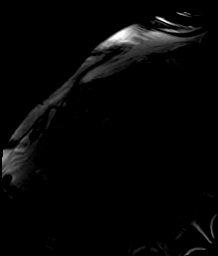

[Series 34: (id)_long_t1_moco · oblique · 8.0mm · 1.56mm/px · 1 of 24 slices shown]
[im 1/24]
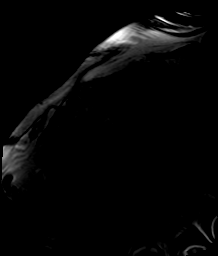

[Series 37: (id)_trufi · oblique · 8.0mm · 2.08mm/px · 1 of 9 slices shown]
[im 1/9]
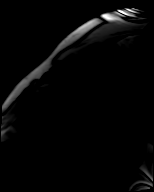

[Series 38: (id)_trufi_moco · oblique · 8.0mm · 2.08mm/px · 1 of 9 slices shown]
[im 1/9]
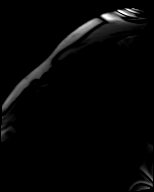

[Series 41: pre short axis · oblique · non-contrast · 8.0mm · 2.25mm/px · 1 of 10 slices shown (1 of 6)]
[im 1/10]
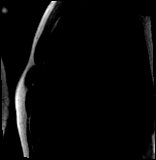

[Series 42: pre short axis · oblique · non-contrast · 8.0mm · 2.25mm/px · 1 of 10 slices shown (2 of 6)]
[im 1/10]
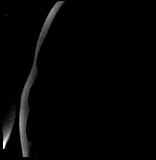

[Series 43: pre short axis · oblique · non-contrast · 8.0mm · 2.25mm/px · 1 of 10 slices shown (3 of 6)]
[im 1/10]
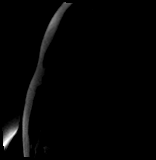

[Series 44: pre short axis · oblique · non-contrast · 8.0mm · 2.25mm/px · 1 of 10 slices shown (4 of 6)]
[im 1/10]
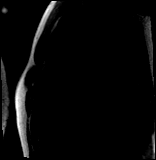

[Series 45: pre short axis · oblique · non-contrast · 8.0mm · 2.25mm/px · 1 of 10 slices shown (5 of 6)]
[im 1/10]
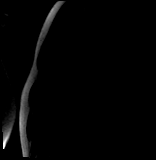

[Series 46: pre short axis · oblique · non-contrast · 8.0mm · 2.25mm/px · 1 of 10 slices shown (6 of 6)]
[im 1/10]
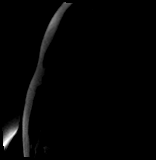

[Series 47: rest short axis · oblique · 8.0mm · 2.25mm/px · 1 of 80 slices shown (1 of 6)]
[im 1/80]
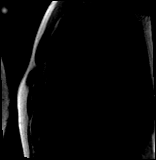

[Series 48: rest short axis · oblique · 8.0mm · 2.25mm/px · 1 of 80 slices shown (2 of 6)]
[im 1/80]
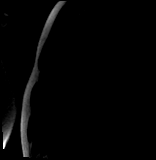

[Series 49: rest short axis · oblique · 8.0mm · 2.25mm/px · 1 of 80 slices shown (3 of 6)]
[im 1/80]
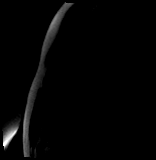

[Series 50: rest short axis · oblique · 8.0mm · 2.25mm/px · 1 of 80 slices shown (4 of 6)]
[im 1/80]
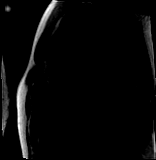

[Series 51: rest short axis · oblique · 8.0mm · 2.25mm/px · 1 of 80 slices shown (5 of 6)]
[im 1/80]
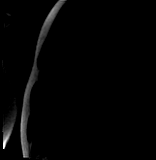

[Series 52: rest short axis · oblique · 8.0mm · 2.25mm/px · 1 of 80 slices shown (6 of 6)]
[im 1/80]
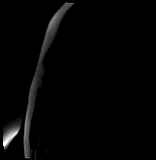

[Series 53: bSSFP · coronal · 6.0mm · 1.41mm/px · 1 of 25 slices shown (26 of 26)]
[im 1/25]
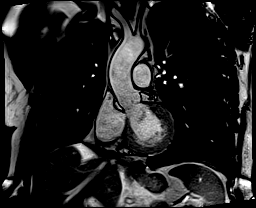

[Series 54: cine rvit · oblique · 6.0mm · 1.41mm/px · 1 of 25 slices shown]
[im 1/25]
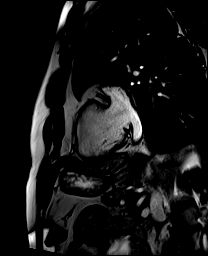

[Series 55: aortic valve cine · axial · 6.0mm · 1.41mm/px · 1 of 25 slices shown]
[im 1/25]
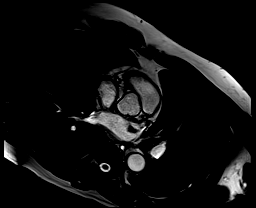

[45 of 48 positions shown; findings below may reference images not displayed]

FINDINGS: Left ventricle:

-Asymmetric hypertrophy measuring up to 20mm in basal anteroseptum
(8mm in posterior wall)

-Normal size

-Normal systolic function

-Mild ECV elevation (29%)

-Patchy LGE in basal septum and RV insertion sites. LGE accounts for
11% of total myocardial mass

LV EF: 63% (Normal 56-78%)

Absolute volumes:

LV EDV: 116mL (Normal 77-195 mL)

LV ESV: 43mL (Normal 19-72 mL)

LV SV: 73mL (Normal 51-133 mL)

CO: 6.2L/min (Normal 2.8-8.8 L/min)

Indexed volumes:

LV EDV: 51mL/sq-m (Normal 47-92 mL/sq-m)

LV ESV: 19mL/sq-m (Normal 13-30 mL/sq-m)

LV SV: 32mL/sq-m (Normal 32-62 mL/sq-m)

CI: 2.8L/min/sq-m (Normal 1.7-4.2 L/min/sq-m)

Right ventricle: Normal size and systolic function

RV EF:  63% (Normal 47-74%)

Absolute volumes:

RV EDV: 132mL (Normal 88-227 mL)

RV ESV: 50mL (Normal 23-103 mL)

RV SV: 82mL (Normal 52-138 mL)

CO: 7.0L/min (Normal 2.8-8.8 L/min)

Indexed volumes:

RV EDV: 58mL/sq-m (Normal 55-105 mL/sq-m)

RV ESV: 22mL/sq-m (Normal 15-43 mL/sq-m)

RV SV: 36mL/sq-m (Normal 32-64 mL/sq-m)

CI: 3.1L/min/sq-m (Normal 1.7-4.2 L/min/sq-m)

Left atrium: Mild enlargement

Right atrium: Normal size

Mitral valve: No regurgitation

Aortic valve: No regurgitation

Tricuspid valve: No regurgitation

Pulmonic valve: No regurgitation

Aorta: Normal proximal ascending aorta

Pericardium: Normal
IMPRESSION: 1. Asymmetric hypertrophy measuring up to 20mm in basal anteroseptum
(8mm in posterior wall), consistent with hypertrophic cardiomyopathy

2. Patchy LGE in basal septum and RV insertion sites, consistent
with HCM. LGE accounts for 11% of total myocardial mass

3. Normal LV size and systolic function (EF 63%)

4. Normal RV size and systolic function (EF 63%)

## 2022-04-21 MED ORDER — NEBIVOLOL HCL 20 MG PO TABS
20.0000 mg | ORAL_TABLET | Freq: Two times a day (BID) | ORAL | 2 refills | Status: DC
  Filled 2022-04-21 – 2022-05-05 (×2): qty 60, 30d supply, fill #0
  Filled 2022-06-07: qty 60, 30d supply, fill #1
  Filled 2022-07-09: qty 60, 30d supply, fill #2

## 2022-04-28 ENCOUNTER — Other Ambulatory Visit: Payer: Self-pay

## 2022-05-05 ENCOUNTER — Other Ambulatory Visit: Payer: Self-pay

## 2022-05-10 ENCOUNTER — Other Ambulatory Visit: Payer: Self-pay

## 2022-05-10 ENCOUNTER — Other Ambulatory Visit: Payer: Self-pay | Admitting: Pulmonary Disease

## 2022-05-10 DIAGNOSIS — J4550 Severe persistent asthma, uncomplicated: Secondary | ICD-10-CM

## 2022-05-11 ENCOUNTER — Other Ambulatory Visit: Payer: Self-pay | Admitting: Internal Medicine

## 2022-05-11 ENCOUNTER — Other Ambulatory Visit: Payer: Self-pay

## 2022-05-13 ENCOUNTER — Other Ambulatory Visit: Payer: Self-pay

## 2022-05-17 ENCOUNTER — Telehealth: Payer: Self-pay | Admitting: Pulmonary Disease

## 2022-05-17 NOTE — Telephone Encounter (Signed)
Called patient and told him his samples are upfront and ready to go. Nothing further needed

## 2022-05-18 ENCOUNTER — Other Ambulatory Visit: Payer: Self-pay | Admitting: Internal Medicine

## 2022-05-18 ENCOUNTER — Other Ambulatory Visit: Payer: Self-pay

## 2022-05-18 ENCOUNTER — Other Ambulatory Visit: Payer: Self-pay | Admitting: *Deleted

## 2022-05-18 DIAGNOSIS — R0609 Other forms of dyspnea: Secondary | ICD-10-CM

## 2022-05-18 DIAGNOSIS — M5416 Radiculopathy, lumbar region: Secondary | ICD-10-CM

## 2022-05-18 DIAGNOSIS — J4550 Severe persistent asthma, uncomplicated: Secondary | ICD-10-CM

## 2022-05-18 MED ORDER — TRELEGY ELLIPTA 200-62.5-25 MCG/ACT IN AEPB
1.0000 | INHALATION_SPRAY | Freq: Every day | RESPIRATORY_TRACT | 3 refills | Status: DC
  Filled 2022-05-18 – 2022-06-06 (×2): qty 60, 30d supply, fill #0
  Filled 2022-07-05: qty 60, 30d supply, fill #1
  Filled 2022-08-10: qty 60, 30d supply, fill #2

## 2022-05-19 ENCOUNTER — Other Ambulatory Visit: Payer: Self-pay

## 2022-05-19 MED ORDER — TRAMADOL HCL 50 MG PO TABS
50.0000 mg | ORAL_TABLET | Freq: Three times a day (TID) | ORAL | 0 refills | Status: DC | PRN
  Filled 2022-05-19: qty 21, 7d supply, fill #0
  Filled 2022-06-16: qty 21, 7d supply, fill #1
  Filled 2022-06-22: qty 9, 3d supply, fill #1

## 2022-05-21 ENCOUNTER — Other Ambulatory Visit: Payer: Self-pay | Admitting: Cardiology

## 2022-05-23 ENCOUNTER — Other Ambulatory Visit: Payer: Self-pay

## 2022-05-23 MED ORDER — AMLODIPINE BESYLATE 5 MG PO TABS
5.0000 mg | ORAL_TABLET | Freq: Two times a day (BID) | ORAL | 1 refills | Status: DC
  Filled 2022-05-23: qty 180, 90d supply, fill #0

## 2022-05-25 ENCOUNTER — Other Ambulatory Visit: Payer: Self-pay | Admitting: Pulmonary Disease

## 2022-06-01 ENCOUNTER — Telehealth: Payer: Self-pay | Admitting: *Deleted

## 2022-06-01 NOTE — Telephone Encounter (Signed)
CPAP supply order sent to [place] via [facility] portal. Patient was a prior  [facility] patient. He now has [facility] which they do not take.

## 2022-06-06 ENCOUNTER — Other Ambulatory Visit: Payer: Self-pay

## 2022-06-07 ENCOUNTER — Other Ambulatory Visit: Payer: Self-pay | Admitting: Internal Medicine

## 2022-06-07 ENCOUNTER — Other Ambulatory Visit: Payer: Self-pay

## 2022-06-07 NOTE — Telephone Encounter (Signed)
Requested medications are due for refill today.  unsure  Requested medications are on the active medications list.  yes  Last refill. 04/10/2022 15m 1 rf  Future visit scheduled.   yes  Notes to clinic.  Medication requires manual review for refill.    Requested Prescriptions  Pending Prescriptions Disp Refills   ipratropium (ATROVENT) 0.06 % nasal spray 15 mL 1    Sig: Place 2 sprays into both nostrils 3 (three) times daily. As needed for nasal congestion, runny nose     Off-Protocol Failed - 06/07/2022  7:40 AM      Failed - Medication not assigned to a protocol, review manually.      Passed - Valid encounter within last 12 months    Recent Outpatient Visits           3 months ago Type 2 diabetes mellitus with obesity ([place]   [place]   4 months ago Chronic maxillary sinusitis   [place]   5 months ago Lumbar radiculopathy   [place] MD   11 months ago Severe persistent asthma with allergic rhinitis without complication   [place]   12 months ago Severe persistent asthma without complication   [place]       Future Appointments             In 3 weeks [place]ell Pier MD [place]  In 2 months SDonato Heinz [facility]          Off-Protocol Failed - 06/07/2022  7:40 AM      Failed - Medication not assigned to a protocol, review manually.      Passed - Valid encounter within last 12 months    Recent Outpatient Visits           3 months ago Type 2 diabetes mellitus with obesity ([place]   [place]   4 months ago Chronic  maxillary sinusitis   [place] MD   5 months ago Lumbar radiculopathy   CLos Llanos MD   11 months ago Severe persistent asthma with allergic rhinitis without complication   [place] MD   12 months ago Severe persistent asthma without complication   CMegargel MD       Future Appointments             In 3 weeks JLadell Pier MD [place]  In 2 months [facility]ato Heinz [facility]

## 2022-06-08 ENCOUNTER — Other Ambulatory Visit: Payer: Self-pay

## 2022-06-08 MED ORDER — IPRATROPIUM BROMIDE 0.06 % NA SOLN
2.0000 | Freq: Three times a day (TID) | NASAL | 0 refills | Status: DC
  Filled 2022-06-08: qty 15, 25d supply, fill #0

## 2022-06-09 ENCOUNTER — Other Ambulatory Visit: Payer: Self-pay

## 2022-06-13 ENCOUNTER — Other Ambulatory Visit: Payer: Self-pay

## 2022-06-15 ENCOUNTER — Other Ambulatory Visit: Payer: Self-pay

## 2022-06-16 ENCOUNTER — Other Ambulatory Visit: Payer: Self-pay | Admitting: Pulmonary Disease

## 2022-06-16 ENCOUNTER — Other Ambulatory Visit: Payer: Self-pay

## 2022-06-16 ENCOUNTER — Other Ambulatory Visit: Payer: Self-pay | Admitting: Internal Medicine

## 2022-06-16 DIAGNOSIS — M5416 Radiculopathy, lumbar region: Secondary | ICD-10-CM

## 2022-06-16 NOTE — Telephone Encounter (Signed)
Ok to refill 

## 2022-06-18 ENCOUNTER — Other Ambulatory Visit: Payer: Self-pay | Admitting: Physician Assistant

## 2022-06-18 DIAGNOSIS — K5909 Other constipation: Secondary | ICD-10-CM

## 2022-06-19 ENCOUNTER — Other Ambulatory Visit: Payer: Self-pay | Admitting: Internal Medicine

## 2022-06-19 DIAGNOSIS — M5416 Radiculopathy, lumbar region: Secondary | ICD-10-CM

## 2022-06-20 ENCOUNTER — Other Ambulatory Visit: Payer: Self-pay | Admitting: Internal Medicine

## 2022-06-20 ENCOUNTER — Other Ambulatory Visit: Payer: Self-pay

## 2022-06-20 DIAGNOSIS — M5416 Radiculopathy, lumbar region: Secondary | ICD-10-CM

## 2022-06-20 DIAGNOSIS — K5909 Other constipation: Secondary | ICD-10-CM

## 2022-06-20 NOTE — Telephone Encounter (Signed)
Requested medication (s) are due for refill today -yes  Requested medication (s) are on the active medication list -yes  Future visit scheduled -yes  Last refill: 05/19/22 #30  Notes to clinic: non delegated Rx  Requested Prescriptions  Pending Prescriptions Disp Refills   traMADol (ULTRAM) 50 MG tablet 30 tablet 0    Sig: Take 1 tablet (50 mg total) by mouth every 8 (eight) hours as needed.     Not Delegated - Analgesics:  Opioid Agonists Failed - 06/20/2022  3:14 PM      Failed - This refill cannot be delegated      Failed - Urine Drug Screen completed in last 360 days      Failed - Valid encounter within last 3 months    Recent Outpatient Visits           3 months ago Type 2 diabetes mellitus with obesity ([place])   [place] Ladell Pier, MD   5 months ago Chronic maxillary sinusitis   [place] Ladell Pier, MD   6 months ago Lumbar radiculopathy   [place], MD   11 months ago Severe persistent asthma with allergic rhinitis without complication   [place] Ladell Pier, MD   1 year ago Severe persistent asthma without complication   [place], MD       Future Appointments             In 2 weeks Ladell Pier, MD [place]   In 1 month Donato Heinz, MD [place] at [facility]               Requested Prescriptions  Pending Prescriptions Disp Refills   traMADol (ULTRAM) 50 MG tablet 30 tablet 0    Sig: Take 1 tablet (50 mg total) by mouth every 8 (eight) hours as needed.     Not Delegated - Analgesics:  Opioid Agonists Failed - 06/20/2022  3:14 PM      Failed - This refill cannot be delegated      Failed - Urine Drug Screen completed in last 360 days       Failed - Valid encounter within last 3 months    Recent Outpatient Visits           3 months ago Type 2 diabetes mellitus with obesity [facility])   [place] Ladell Pier, MD   5 months ago Chronic maxillary sinusitis   [place], MD   6 months ago Lumbar radiculopathy   [place], MD   11 months ago Severe persistent asthma with allergic rhinitis without complication   [place], MD   1 year ago Severe persistent asthma without complication   [place], MD       Future Appointments             In 2 weeks Ladell Pier, MD [place]   In 1 month Gardiner Rhyme, Doreatha Martin, MD [place] at [facility]

## 2022-06-20 NOTE — Telephone Encounter (Signed)
Requested medication (s) are due for refill today: yes  Requested medication (s) are on the active medication list: yes  Last refill:  05/19/22 # 30/0  Future visit scheduled: yes  Notes to clinic:  Unable to refill per protocol, cannot delegate.   Requested Prescriptions  Pending Prescriptions Disp Refills   traMADol (ULTRAM) 50 MG tablet [Pharmacy Med Name: TRAMADOL HCL 50 MG TABLET] 90 tablet 0    Sig: TAKE 1 TABLET BY MOUTH EVERY 8 HOURS AS NEEDED.     Not Delegated - Analgesics:  Opioid Agonists Failed - 06/19/2022  1:11 PM      Failed - This refill cannot be delegated      Failed - Urine Drug Screen completed in last 360 days      Failed - Valid encounter within last 3 months    Recent Outpatient Visits           3 months ago Type 2 diabetes mellitus with obesity [facility])   [place] Ladell Pier, MD   5 months ago Chronic maxillary sinusitis   [place], MD   6 months ago Lumbar radiculopathy   [place], MD   11 months ago Severe persistent asthma with allergic rhinitis without complication   [place], MD   1 year ago Severe persistent asthma without complication   [place], MD       Future Appointments             In 2 weeks Ladell Pier, MD [place]   In 1 month Gardiner Rhyme, Doreatha Martin, MD [place] at [facility]

## 2022-06-21 NOTE — Telephone Encounter (Signed)
Requested medication (s) are due for refill today: yes  Requested medication (s) are on the active medication list: yes  Last refill:  06/03/21 #30/6  Future visit scheduled: yes  Notes to clinic:  no protocol attached, pt has upcoming appt.      Requested Prescriptions  Pending Prescriptions Disp Refills   linaclotide (LINZESS) 72 MCG capsule 30 capsule 6    Sig: Take 1 capsule (72 mcg total) by mouth daily before breakfast.     There is no refill protocol information for this order

## 2022-06-22 ENCOUNTER — Other Ambulatory Visit: Payer: Self-pay

## 2022-06-22 MED ORDER — LINACLOTIDE 72 MCG PO CAPS
72.0000 ug | ORAL_CAPSULE | Freq: Every day | ORAL | 6 refills | Status: DC
  Filled 2022-06-22: qty 30, 30d supply, fill #0
  Filled 2022-09-20: qty 30, 30d supply, fill #1

## 2022-06-23 ENCOUNTER — Other Ambulatory Visit: Payer: Self-pay

## 2022-06-29 ENCOUNTER — Other Ambulatory Visit: Payer: Self-pay

## 2022-06-29 ENCOUNTER — Other Ambulatory Visit: Payer: Self-pay | Admitting: Internal Medicine

## 2022-06-29 LAB — HM DIABETES EYE EXAM

## 2022-06-29 MED ORDER — IPRATROPIUM BROMIDE 0.06 % NA SOLN
2.0000 | Freq: Three times a day (TID) | NASAL | 0 refills | Status: DC
  Filled 2022-06-29: qty 15, 25d supply, fill #0

## 2022-06-30 ENCOUNTER — Other Ambulatory Visit: Payer: Self-pay | Admitting: Internal Medicine

## 2022-06-30 ENCOUNTER — Other Ambulatory Visit: Payer: Self-pay

## 2022-06-30 DIAGNOSIS — M5416 Radiculopathy, lumbar region: Secondary | ICD-10-CM

## 2022-06-30 NOTE — Telephone Encounter (Signed)
Requested medication (s) are due for refill today - no  Requested medication (s) are on the active medication list -yes  Future visit scheduled -yes  Last refill: 05/19/22 #30  Notes to clinic: Patient response- needs 21 day Rx- non delegated Rx  Requested Prescriptions  Pending Prescriptions Disp Refills   traMADol (ULTRAM) 50 MG tablet 30 tablet 0    Sig: Take 1 tablet (50 mg total) by mouth every 8 (eight) hours as needed.     Not Delegated - Analgesics:  Opioid Agonists Failed - 06/30/2022  7:23 AM      Failed - This refill cannot be delegated      Failed - Urine Drug Screen completed in last 360 days      Failed - Valid encounter within last 3 months    Recent Outpatient Visits           4 months ago Type 2 diabetes mellitus with obesity ([place])   [place] Ladell Pier, MD   5 months ago Chronic maxillary sinusitis   [place] Ladell Pier, MD   6 months ago Lumbar radiculopathy   [place], MD   12 months ago Severe persistent asthma with allergic rhinitis without complication   [place] Ladell Pier, MD   1 year ago Severe persistent asthma without complication   [place], MD       Future Appointments             In 4 days Ladell Pier, MD [place]   In 1 month Donato Heinz, MD [place] at [facility]               Requested Prescriptions  Pending Prescriptions Disp Refills   traMADol (ULTRAM) 50 MG tablet 30 tablet 0    Sig: Take 1 tablet (50 mg total) by mouth every 8 (eight) hours as needed.     Not Delegated - Analgesics:  Opioid Agonists Failed - 06/30/2022  7:23 AM      Failed - This refill cannot be delegated      Failed - Urine Drug  Screen completed in last 360 days      Failed - Valid encounter within last 3 months    Recent Outpatient Visits           4 months ago Type 2 diabetes mellitus with obesity [facility])   [place] Ladell Pier, MD   5 months ago Chronic maxillary sinusitis   [place], MD   6 months ago Lumbar radiculopathy   [place], MD   12 months ago Severe persistent asthma with allergic rhinitis without complication   [place], MD   1 year ago Severe persistent asthma without complication   [place], MD       Future Appointments             In 4 days Ladell Pier, MD [place]   In 1 month Gardiner Rhyme, Doreatha Martin, MD [place] at [facility]

## 2022-07-01 ENCOUNTER — Other Ambulatory Visit: Payer: Self-pay

## 2022-07-02 ENCOUNTER — Other Ambulatory Visit: Payer: Self-pay | Admitting: Pulmonary Disease

## 2022-07-04 ENCOUNTER — Encounter: Payer: Self-pay | Admitting: Internal Medicine

## 2022-07-04 ENCOUNTER — Other Ambulatory Visit: Payer: Self-pay

## 2022-07-04 ENCOUNTER — Ambulatory Visit: Payer: Medicare Other | Attending: Internal Medicine | Admitting: Internal Medicine

## 2022-07-04 ENCOUNTER — Other Ambulatory Visit: Payer: Self-pay | Admitting: Pulmonary Disease

## 2022-07-04 DIAGNOSIS — I700 Atherosclerosis of aorta: Secondary | ICD-10-CM

## 2022-07-04 DIAGNOSIS — I422 Other hypertrophic cardiomyopathy: Secondary | ICD-10-CM

## 2022-07-04 DIAGNOSIS — E1169 Type 2 diabetes mellitus with other specified complication: Secondary | ICD-10-CM

## 2022-07-04 DIAGNOSIS — I421 Obstructive hypertrophic cardiomyopathy: Secondary | ICD-10-CM | POA: Insufficient documentation

## 2022-07-04 DIAGNOSIS — F17210 Nicotine dependence, cigarettes, uncomplicated: Secondary | ICD-10-CM

## 2022-07-04 DIAGNOSIS — Z6836 Body mass index (BMI) 36.0-36.9, adult: Secondary | ICD-10-CM

## 2022-07-04 DIAGNOSIS — J4550 Severe persistent asthma, uncomplicated: Secondary | ICD-10-CM

## 2022-07-04 DIAGNOSIS — M5416 Radiculopathy, lumbar region: Secondary | ICD-10-CM

## 2022-07-04 DIAGNOSIS — Z7189 Other specified counseling: Secondary | ICD-10-CM

## 2022-07-04 LAB — POCT GLYCOSYLATED HEMOGLOBIN (HGB A1C): HbA1c, POC (controlled diabetic range): 8 % — AB (ref 0.0–7.0)

## 2022-07-04 LAB — GLUCOSE, POCT (MANUAL RESULT ENTRY): POC Glucose: 138 mg/dl — AB (ref 70–99)

## 2022-07-04 MED ORDER — IPRATROPIUM BROMIDE 0.06 % NA SOLN: 2.0000 | Freq: Three times a day (TID) | NASAL | 0 refills | Status: DC

## 2022-07-04 MED ORDER — TRAMADOL HCL 50 MG PO TABS: 50.0000 mg | ORAL_TABLET | Freq: Every evening | ORAL | 2 refills | Status: DC

## 2022-07-04 NOTE — Progress Notes (Signed)
Patient ID: Joseph Harvey, male    DOB: [birth year]  MRN: [id number]  CC: Diabetes   Subjective: Kelsey Calvi is a 67 y.o. male who presents for chronic ds management His concerns today include:  DM Type2, HL, HTN, HCM (followed by cardiologist Dr. 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   DM: Results for orders placed or performed in visit on 07/04/22  POCT glucose (manual entry)  Result Value Ref Range   POC Glucose 138 (A) 70 - 99 mg/dl  POCT glycosylated hemoglobin (Hb A1C)  Result Value Ref Range   Hemoglobin A1C     HbA1c POC (<> result, manual entry)     HbA1c, POC (prediabetic range)     HbA1c, POC (controlled diabetic range) 8.0 (A) 0.0 - 7.0 %  Eating a lot of sweet and bread Taking Metformin 500 mg BID, tolerating it well Had eye exam 06/29/21.  Will have surgery to remove fat from RT upper eye lid  HTN/HCM: Currently on amlodipine 5 mg twice a day, HCTZ 25 mg daily, nebivolol 20 mg twice a day and Diovan 320 mg daily..  Blood pressure today a little elevated.  States that he had 2 cups of black coffee before coming in.  Sometimes has swelling in the feet. Saw his cardiologist Dr. Gardiner Rhyme 01/2022.  HCTZ was added on that visit as he was volume overloaded.  Tolerating the HCTZ okay given HCM.  Lumbar radiculopathy: I had referred him to Dr. Lorin Mercy back in September 2023.  He referred him to Dr. Ernestina Patches to be considered for [facility].  He never did get the injection because he became too anxious prior to the procedure.  He tells me that he does not wish to go back to Dr. Lorin Mercy or to Dr. Ernestina Patches because he does not trust them.  Does not wish to have any further surgery on his back.  Will be changing his insurance to [employer].  At that time he plans to go back to the surgeon that did his  initial back surgery at [facility].   -Currently takes gabapentin, Tylenol and tramadol at bedtime to keep his pain level down.  Takes baclofen only as needed because it makes him too drowsy.  HL/Aortic atherosclerosis: Reports compliance with taking Crestor 40 mg daily and Zetia 10 mg daily.  OSA: Using his CPAP at night and finds it beneficial.  Asthma: He saw his pulmonologist Dr. Silas Flood in December.  Stable on Trelegy.  Feels this is working better for him.  Wanting refill on prednisone.  HM: Declines being scheduled for Medicare wellness visit.  He feels it is a way to make more money.  Patient Active Problem List   Diagnosis Date Noted   Hypertrophic cardiomyopathy ([place]) 07/04/2022   Aortic atherosclerosis ([place]) 07/04/2022   PVC (premature ventricular contraction) 08/10/2021   Pain due to onychomycosis of toenails of both feet 08/21/2020   Panic disorder 11/15/2019   Lipomatosis 07/09/2019   Low back pain 09/25/2018   Lumbar radiculopathy 09/25/2018   Constipation 09/17/2018   History of prostate cancer 09/17/2018   Hyperlipidemia associated with type 2 diabetes mellitus ([place]) 09/17/2018   History of paranasal sinus congestion 07/20/2018   Tobacco dependence 07/20/2018   Controlled type 2 diabetes mellitus without complication, without long-term current use of insulin ([place]) 07/20/2018   Vitamin D  deficiency 08/18/2016   Hyperlipidemia LDL goal <100 08/10/2016   Renal insufficiency 07/11/2016   Family history of colon cancer 09/30/2015   GERD (gastroesophageal reflux disease) 09/30/2015   History of colon polyps 09/30/2015   Obesity, morbid ([place]) 03/07/2015   Allergic rhinitis 12/15/2014   Malignant neoplasm of prostate ([place]) 06/02/2014   Cough 07/25/2012   HBP (high blood pressure) 06/01/2012   Asthma, severe persistent 05/30/2012   Asthma 05/30/2012     Current Outpatient Medications on File Prior to Visit  Medication Sig Dispense Refill   acetaminophen  (TYLENOL) 325 MG tablet Take 2 tablets (650 mg total) by mouth every 4 (four) hours as needed for headache or mild pain.     albuterol (PROVENTIL) (2.5 MG/3ML) 0.083% nebulizer solution TAKE 3 MLS BY NEBULIZATION EVERY 4 HOURS AS NEEDED FOR WHEEZING OR SHORTNESS OF BREATH (((PLAN B))) 375 mL 6   albuterol (VENTOLIN HFA) 108 (90 Base) MCG/ACT inhaler Inhale 2 puffs into the lungs every 4 hours as needed for shortness of breath or wheezing 18 g 5   amLODipine (NORVASC) 5 MG tablet Take 1 tablet (5 mg total) by mouth in the morning and at bedtime. 180 tablet 1   ASPIRIN 81 PO Take 81 mg by mouth daily.     Blood Glucose Monitoring Suppl (TRUE METRIX METER) w/Device KIT Use as directed 1 kit 0   busPIRone (BUSPAR) 10 MG tablet Take 1 tablet (10 mg total) by mouth 3 (three) times daily. (Patient taking differently: Take 10 mg by mouth 3 (three) times daily as needed (anxiety).) 90 tablet 3   Cholecalciferol (VITAMIN D PO) Take 1 tablet by mouth daily.     diazepam (VALIUM) 10 MG tablet Take 1 tablet (10 mg total) by mouth 60 (sixty) minutes before procedure for 1 dose. Take with a sip of water, on an empty stomach. 1 tablet 0   ezetimibe (ZETIA) 10 MG tablet Take 1 tablet (10 mg total) by mouth daily. 90 tablet 3   Fluticasone-Umeclidin-Vilant (TRELEGY ELLIPTA) 200-62.5-25 MCG/ACT AEPB Inhale 1 puff into the lungs daily. 60 each 3   gabapentin (NEURONTIN) 300 MG capsule take 1 capsule by mouth in the morning, then take 1 at lunch and 2 at bedtime (Patient taking differently: Take 300 mg by mouth 3 (three) times daily as needed (Nerve pain).) 360 capsule 3   glucose blood (TRUE METRIX BLOOD GLUCOSE TEST) test strip Use as instructed 100 each 12   hydrochlorothiazide (HYDRODIURIL) 25 MG tablet TAKE 1 TABLET BY MOUTH EVERY DAY 90 tablet 1   ipratropium-albuterol (DUONEB) 0.5-2.5 (3) MG/3ML SOLN INHALE 1 VIAL VIA NEBULIZER TWICE A DAY 90 mL 3   lidocaine (LIDODERM) 5 % Apply 1 patch to affected area daily and  leave on for 12 hours at a time.  Remove after 12 hours 30 patch 0   linaclotide (LINZESS) 72 MCG capsule Take 1 capsule (72 mcg total) by mouth daily before breakfast. 30 capsule 6   metFORMIN (GLUCOPHAGE-XR) 500 MG 24 hr tablet Take 1 tablet (500 mg total) by mouth 2 (two) times daily with a meal. 60 tablet 6   montelukast (SINGULAIR) 10 MG tablet Take 1 tablet (10 mg total) by mouth at bedtime. 30 tablet 3   Nebivolol HCl 20 MG TABS Take 1 tablet (20 mg total) by mouth 2 (two) times daily. 60 tablet 2   pantoprazole (PROTONIX) 40 MG tablet Take 1 tablet (40 mg total) by mouth daily as needed. 30 tablet 2  potassium chloride SA (KLOR-CON M) 20 MEQ tablet Take 1 tablet (20 mEq total) by mouth daily as needed (When hands cramp up). 60 tablet 2   predniSONE (DELTASONE) 10 MG tablet TAKE 1-3 TABLETS BY MOUTH AS NEEDED FOR CHEST TIGHTNESS OR WHEEZE OR WORSENING SHORTNESS OF BREATH 20 tablet 0   Respiratory Therapy Supplies (FLUTTER) DEVI Use three times a day after inhaler or nebulizer use 1 each 0   rosuvastatin (CRESTOR) 20 MG tablet Take 1 tablet (20 mg total) by mouth daily. 90 tablet 3   silver sulfADIAZINE (SILVADENE) 1 % cream Apply twice a day to affected area 50 g 0   TRUEplus Lancets 28G MISC Use as directed 100 each 4   valsartan (DIOVAN) 320 MG tablet Take 1 tablet (320 mg total) by mouth daily. 90 tablet 3   No current facility-administered medications on file prior to visit.    Allergies  Allergen Reactions   Amoxicillin Anaphylaxis    Has patient had a PCN reaction causing immediate rash, facial/tongue/throat swelling, SOB or lightheadedness with hypotension: Yes Has patient had a PCN reaction causing severe rash involving mucus membranes or skin necrosis: No Has patient had a PCN reaction that required hospitalization: Yes Has patient had a PCN reaction occurring within the last 10 years: Yes If all of the above answers are "NO", then may proceed with Cephalosporin use.     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[employer]  Tobacco Use   Smoking status: Every Day    Packs/day: 0.75    Years: 46.00    Total pack years: 34.50    Types: Cigarettes    Start date: 1976   Smokeless tobacco: Never   Tobacco comments:    still smoking 0.5 ppd  Vaping Use   Vaping Use: Never used  Substance and Sexual Activity   Alcohol use: Not Currently    Alcohol/week: 3.0 standard drinks of alcohol    Types: 3 Cans of beer per week   Drug use: No   Sexual activity: Not on file  Other Topics Concern   Not on file  Social History Narrative   Not on file   Social Determinants of Health   Financial Resource Strain: Not on file  Food Insecurity: Not on file  Transportation Needs: Not on file  Physical Activity: Not on file  Stress: No Stress Concern Present (05/26/2020)   [place]    Feeling of Stress : Only a little  Social Connections: Not on file  Intimate Partner Violence: Not on file    Family History  Problem Relation Age of Onset   Colon cancer Father    Lung cancer Father        was a smoker   Prostate cancer Father    Lung cancer Mother        was a smoker   Asthma Mother    Allergies Brother    Diabetes Maternal Grandmother    Colon polyps Neg Hx    Esophageal cancer Neg  Hx    Stomach cancer Neg Hx    Rectal cancer Neg Hx     Past Surgical History:  Procedure Laterality Date   BACK SURGERY  2017   CATARACT EXTRACTION Bilateral    COLONOSCOPY     2013   COLONOSCOPY  10/03/2019   KNEE ARTHROSCOPY  2005    right   PROSTATE SURGERY     PVC ABLATION N/A 08/10/2021   Procedure: PVC ABLATION;  Surgeon: Constance Haw, MD;  Location: [place] [facility];  Service: Cardiovascular;  Laterality: N/A;   ROBOT ASSISTED LAPAROSCOPIC RADICAL PROSTATECTOMY  03/2010    ROS: Review of Systems Negative except as stated above  PHYSICAL EXAM: BP 120/78   Pulse 91   Ht 5' 9"$  (1.753 m)   Wt 246 lb 9.6 oz (111.9 kg)   SpO2 95%   BMI 36.42 kg/m   Wt Readings from Last 3 Encounters:  07/04/22 246 lb 9.6 oz (111.9 kg)  04/18/22 239 lb 12.8 oz (108.8 kg)  02/28/22 242 lb 3.2 oz (109.9 kg)    Physical Exam   General appearance - alert, well appearing, obese older African-American male and in no distress Mental status - normal mood, behavior, speech, dress, motor activity, and thought processes Neck - supple, no significant adenopathy Chest - clear to auscultation, no wheezes, rales or rhonchi, symmetric air entry Heart - normal rate, regular rhythm, normal S1, S2, no murmurs, rubs, clicks or gallops Extremities - peripheral pulses normal, no pedal edema, no clubbing or cyanosis     Latest Ref Rng & Units 02/01/2022    9:17 AM 09/24/2021    9:34 AM 08/03/2021    7:53 AM  CMP  Glucose 70 - 99 mg/dL 108  100  117   BUN 8 - 27 mg/dL 14  13  13   $ Creatinine 0.76 - 1.27 mg/dL 1.22  1.24  1.21   Sodium 134 - 144 mmol/L 142  141  139   Potassium 3.5 - 5.2 mmol/L 3.7  3.5  4.1   Chloride 96 - 106 mmol/L 101  99  96   CO2 20 - 29 mmol/L 28  28  28   $ Calcium 8.6 - 10.2 mg/dL 9.2  9.5  9.6   Total Protein 6.0 - 8.5 g/dL  7.0    Total Bilirubin 0.0 - 1.2 mg/dL  0.8    Alkaline Phos 44 - 121 IU/L  71    AST 0 - 40 IU/L  21    ALT 0 - 44 IU/L  27     Lipid Panel     Component Value Date/Time   CHOL 94 (L) 02/01/2022 0917   TRIG 149 02/01/2022 0917   HDL 45 02/01/2022 0917   CHOLHDL 2.1 02/01/2022 0917   LDLCALC 24 02/01/2022 0917    CBC    Component Value Date/Time   WBC 12.2 (H) 12/17/2021  1526   WBC 8.7 02/26/2020 1007   RBC 4.41 12/17/2021 1526   RBC 4.99 02/26/2020 1007   HGB 14.1 12/17/2021 1526   HCT 40.6 12/17/2021 1526   PLT 345 12/17/2021 1526   MCV 92 12/17/2021 1526   MCH 32.0 12/17/2021 1526   MCH 31.3 06/20/2018 0309   MCHC 34.7 12/17/2021 1526   MCHC 35.0 02/26/2020 1007   RDW 13.0 12/17/2021 1526   LYMPHSABS 3.2 02/26/2020 1007   MONOABS 0.7 02/26/2020 1007   EOSABS 0.1 02/26/2020 1007   BASOSABS 0.2 (H) 02/26/2020 1007  ASSESSMENT AND PLAN: 1. Type 2 diabetes mellitus with morbid obesity (HCC) Not at goal.  I recommend increasing metformin to 1000 mg in the morning and continuing at 500 mg at night. Discussed on encourage healthy eating habits.  Encouraged him to move is much as he can. - POCT glucose (manual entry) - POCT glycosylated hemoglobin (Hb A1C)  2. Hypertrophic cardiomyopathy ([place]) Followed by cardiology.  Stable.  Continue nebivolol, valsartan, Norvasc and hydrochlorothiazide.  3. Aortic atherosclerosis ([place]) Continue Crestor and Setia.  4. Severe persistent asthma without complication Followed by pulmonary.  Stable on Trelegy.  I request that he submit request for prednisone through his pulmonologist so that he is aware of how often he may have to take prednisone in which case treatment strategies could be adjusted if needed.  5. Lumbar radiculopathy Since he has been taking the tramadol daily, we will go ahead and get him on a controlled substance prescribing agreement.  I went over possible side effects of the medication including drowsiness, constipation, dependence and addiction. Went over a controlled substance prescribing agreement with him.  I had our CMA print a copy for him to review and sign.  He is agreeable to urine drug test.  Low risks based upon opioid risk assessment. [place] controlled substance reporting system reviewed. [id number] 11+Oxyco+Alc+Crt-Bund - traMADol (ULTRAM) 50 MG tablet; Take 1 tablet (50 mg  total) by mouth every evening. Each prescription to last 1 mth  Dispense: 30 tablet; Refill: 2  6. Pain management contract discussed [id number] 11+Oxyco+Alc+Crt-Bund - traMADol (ULTRAM) 50 MG tablet; Take 1 tablet (50 mg total) by mouth every evening. Each prescription to last 1 mth  Dispense: 30 tablet; Refill: 2     Patient was given the opportunity to ask questions.  Patient verbalized understanding of the plan and was able to repeat key elements of the plan.   This documentation was completed using Radio producer.  Any transcriptional errors are unintentional.  Orders Placed This Encounter  Procedures   [id number] 11+Oxyco+Alc+Crt-Bund   POCT glucose (manual entry)   POCT glycosylated hemoglobin (Hb A1C)     Requested Prescriptions   Signed Prescriptions Disp Refills   ipratropium (ATROVENT) 0.06 % nasal spray 15 mL 0    Sig: Place 2 sprays into both nostrils 3 (three) times daily. As needed for nasal congestion, runny nose   traMADol (ULTRAM) 50 MG tablet 30 tablet 2    Sig: Take 1 tablet (50 mg total) by mouth every evening. Each prescription to last 1 mth    Return in about 4 months (around 11/02/2022).  Karle Plumber, MD, FACP

## 2022-07-05 ENCOUNTER — Other Ambulatory Visit: Payer: Self-pay | Admitting: Pharmacist

## 2022-07-05 ENCOUNTER — Other Ambulatory Visit: Payer: Self-pay

## 2022-07-05 DIAGNOSIS — E669 Obesity, unspecified: Secondary | ICD-10-CM

## 2022-07-05 MED ORDER — METFORMIN HCL ER 500 MG PO TB24
ORAL_TABLET | ORAL | 6 refills | Status: DC
  Filled 2022-07-05: qty 90, fill #0

## 2022-07-05 MED ORDER — METFORMIN HCL ER 500 MG PO TB24
ORAL_TABLET | ORAL | 1 refills | Status: DC
  Filled 2022-07-05: qty 270, 90d supply, fill #0
  Filled 2022-10-02: qty 270, 90d supply, fill #1

## 2022-07-06 NOTE — Telephone Encounter (Signed)
Dr Silas Flood,  Patients rx for prednisone was sent in on 06/17/2022.  Patient is requesting another refill.  Please advise.

## 2022-07-09 LAB — DRUG SCREEN 764883 11+OXYCO+ALC+CRT-BUND
Amphetamines, Urine: NEGATIVE ng/mL
BENZODIAZ UR QL: NEGATIVE ng/mL
Barbiturate: NEGATIVE ng/mL
Cannabinoid Quant, Ur: NEGATIVE ng/mL
Cocaine (Metabolite): NEGATIVE ng/mL
Creatinine: 105.3 mg/dL (ref 20.0–300.0)
Ethanol: NEGATIVE %
Meperidine: NEGATIVE ng/mL
Methadone Screen, Urine: NEGATIVE ng/mL
OPIATE SCREEN URINE: NEGATIVE ng/mL
Oxycodone/Oxymorphone, Urine: NEGATIVE ng/mL
Phencyclidine: NEGATIVE ng/mL
Propoxyphene: NEGATIVE ng/mL
pH, Urine: 5.4 (ref 4.5–8.9)

## 2022-07-09 LAB — TRAMADOL GC/MS, URINE
Tramadol gc/ms Conf: 1560 ng/mL
Tramadol: POSITIVE — AB

## 2022-07-11 ENCOUNTER — Other Ambulatory Visit: Payer: Self-pay | Admitting: Internal Medicine

## 2022-07-11 ENCOUNTER — Other Ambulatory Visit: Payer: Self-pay

## 2022-07-11 MED ORDER — IPRATROPIUM BROMIDE 0.06 % NA SOLN: 2.0000 | Freq: Three times a day (TID) | NASAL | 6 refills | Status: DC

## 2022-07-12 ENCOUNTER — Other Ambulatory Visit: Payer: Self-pay

## 2022-07-18 ENCOUNTER — Other Ambulatory Visit: Payer: Self-pay

## 2022-07-19 NOTE — Telephone Encounter (Signed)
Verified rx was sent in for Prednisone on 07/06/2022.

## 2022-07-20 ENCOUNTER — Other Ambulatory Visit: Payer: Self-pay

## 2022-07-25 ENCOUNTER — Ambulatory Visit: Payer: Medicare HMO | Admitting: Pulmonary Disease

## 2022-07-25 ENCOUNTER — Encounter: Payer: Self-pay | Admitting: Pulmonary Disease

## 2022-07-25 VITALS — BP 126/70 | HR 86 | Wt 247.2 lb

## 2022-07-25 DIAGNOSIS — J4550 Severe persistent asthma, uncomplicated: Secondary | ICD-10-CM

## 2022-07-25 MED ORDER — PREDNISONE 20 MG PO TABS: ORAL_TABLET | ORAL | 0 refills | Status: AC

## 2022-07-25 NOTE — Progress Notes (Signed)
Patient ID: Joseph Harvey, male    DOB: 1956-01-06, 67 y.o.   MRN: [id number]  Chief Complaint  Patient presents with   Acute Visit    Pt is here for an asthma flare up. Pt thinks that this might be his heart issues again. He states he is having to use nebs and inhaler before he goes walking or anything. Feet has been swelling up up here lately.     Referring provider: Ladell Pier, MD  HPI:   Joseph Harvey is a 67 y.o. man whom we are seeing in follow-up for dyspnea exertion, asthma.  Most recent PCP note reviewed.  Here for acute visit.  Worsening shortness of breath chest congestion over the last few weeks.  Associated with lower extremity swelling.  Sputum is frothy, clear.  Endorses orthopnea.  But then describes clear triggers such as Lysol scents etc.  This is pretty consistent over time.  New.  Discussed possible worsening asthma in the setting of emerging pollens, a lot of blooming trees, other flowers.  Will trial prednisone.  If symptoms do not improve, certainly strong argument for fluid overload, he has upcoming cardiology appointment.  Seems his diuretic dosing was decreased visit.  HPI at initial visit: [name] last in 2016.  At last visit symptoms are well controlled with Symbicort.  Unclear exactly what occurred in the last 5 years.  But over the last several months he endorses worsening dyspnea on exertion.  Just walking around going to the gym he has become short of breath.  Endorses severe shortness of breath.  This is resolved within a minute or 2 of albuterol administration.  In general his cough is much better than prior.  Suspect he is adhered well to Dr. Gustavus Bryant instructions regarding his airway cough syndrome.  He does feel like he has significant nasal congestion and postnasal drip.  This produces mucus in the back of her throat needs to clear her cough up but feels much different than his prior cough.  Scheduled to have sinus surgery but  this was discontinued due to wheezing per his report.  His insurance is changed and now needs to find a new ENT doctor.  When he gets bad bouts of that mucus buildup he will take a dose of prednisone and within a minute, instantly the mucus feels better.  In terms of his dyspnea, rest improves his dyspnea.  There is no other aggravating or alleviating factors.  He has been on Breo for the last several months and says he thinks it helps somewhat with his breathing but is not at the level it was when he was followed by pulmonary prior.  Prior PFTs reviewed and interpreted as suggestive of mild restriction on spirometry, no fixed obstruction, lung volumes revealed TLC of 86% predicted, within normal limits or mildly reduced.  DLCO within normal limits.  Most recent chest x-ray 06/2018 reviewed instructed is clear lungs, current hyperinflation on PA film does not appear hyperinflated on lateral film.  PMH: Anxiety, obesity, diabetes, tobacco abuse, prostate cancer Surgical history: Lumbar back surgery Family History: Father with colon and lung cancer, mother with lung cancer Social history: Grew up in [place], lived there for 27 years, current smoker, 20+ pack year history   Questionaires / Pulmonary Flowsheets:   ACT:  Asthma Control Test ACT Total Score  04/18/2022  8:32 AM 17  03/10/2021  9:51 AM 13  02/26/2020  9:23 AM 9    MMRC:  No data to display          Epworth:      No data to display          Tests:   FENO:  No results found for: "NITRICOXIDE"  PFT:    Latest Ref Rng & Units 05/26/2021    8:14 AM 02/28/2020   10:53 AM  PFT Results  FVC-Pre L 2.31  2.06   FVC-Predicted Pre % 59  52   FVC-Post L 2.28  2.30   FVC-Predicted Post % 59  59   Pre FEV1/FVC % % 73  70   Post FEV1/FCV % % 72  74   FEV1-Pre L 1.69  1.43   FEV1-Predicted Pre % 56  47   FEV1-Post L 1.65  1.70   DLCO uncorrected ml/min/mmHg 19.07  21.37   DLCO UNC% % 71  80   DLCO corrected  ml/min/mmHg 19.07  22.15   DLCO COR %Predicted % 71  82   DLVA Predicted % 99  123   Personally reviewed and interpreted as mixed restrictive and obstructive physiology with gas trapping, normal DLCO, repeat in 05/2021 showed no longer bronchodilator response likely consistent with better controlled asthma, spirometry overall stable  WALK:      No data to display          Imaging: Reviewed as per EMR  Lab Results: Personally reviewed, no significant elevation of eosinophils, IgE and RAST panel negative in past CBC    Component Value Date/Time   WBC 12.2 (H) 12/17/2021 1526   WBC 8.7 02/26/2020 1007   RBC 4.41 12/17/2021 1526   RBC 4.99 02/26/2020 1007   HGB 14.1 12/17/2021 1526   HCT 40.6 12/17/2021 1526   PLT 345 12/17/2021 1526   MCV 92 12/17/2021 1526   MCH 32.0 12/17/2021 1526   MCH 31.3 06/20/2018 0309   MCHC 34.7 12/17/2021 1526   MCHC 35.0 02/26/2020 1007   RDW 13.0 12/17/2021 1526   LYMPHSABS 3.2 02/26/2020 1007   MONOABS 0.7 02/26/2020 1007   EOSABS 0.1 02/26/2020 1007   BASOSABS 0.2 (H) 02/26/2020 1007    BMET    Component Value Date/Time   NA 142 02/01/2022 0917   K 3.7 02/01/2022 0917   CL 101 02/01/2022 0917   CO2 28 02/01/2022 0917   GLUCOSE 108 (H) 02/01/2022 0917   GLUCOSE 109 (H) 06/20/2018 0309   BUN 14 02/01/2022 0917   CREATININE 1.22 02/01/2022 0917   CALCIUM 9.2 02/01/2022 0917   GFRNONAA 59 (L) 04/14/2020 1036   GFRAA 68 04/14/2020 1036    BNP    Component Value Date/Time   BNP 15.1 09/24/2021 0934   BNP 25.1 07/24/2014 1256    ProBNP    Component Value Date/Time   PROBNP 336 (H) 09/16/2021 1422       Allergies  Allergen Reactions   Amoxicillin Anaphylaxis    Has patient had a PCN reaction causing immediate rash, facial/tongue/throat swelling, SOB or lightheadedness with hypotension: Yes Has patient had a PCN reaction causing severe rash involving mucus membranes or skin necrosis: No Has patient had a PCN reaction that  required hospitalization: Yes Has patient had a PCN reaction occurring within the last 10 years: Yes If all of the above answers are "NO", then may proceed with Cephalosporin use.    Lisinopril Shortness Of Breath   Molds & Smuts Anaphylaxis   Robitussin [Guaifenesin] Shortness Of Breath    wheezing   Azithromycin Other (See Comments)  Immunization History  Administered Date(s) Administered   DTaP 03/16/2012   Influenza Split 02/07/2013, 02/14/2015, 01/21/2016   Influenza Whole 04/15/2012, 02/10/2020   Influenza, High Dose Seasonal PF 01/28/2019, 12/23/2021   Influenza, Seasonal, Injecte, Preservative Fre 02/07/2013, 02/14/2015, 01/21/2016, 01/27/2017   Influenza,inj,Quad PF,6+ Mos 01/28/2019   Influenza,inj,Quad PF,6-35 Mos 02/08/2021   Influenza,trivalent, recombinat, inj, PF 02/07/2013, 02/14/2015   Influenza-Unspecified 02/07/2013, 02/07/2013, 02/14/2015, 02/14/2015, 01/21/2016, 01/21/2016, 12/22/2016, 12/22/2016, 01/27/2017, 01/27/2017   PFIZER(Purple Top)SARS-COV-2 Vaccination 07/29/2019, 08/19/2019, 01/07/2020, 03/17/2021   PNEUMOCOCCAL CONJUGATE-20 12/08/2021   Pneumococcal Conjugate-13 03/16/2015   Pneumococcal Polysaccharide-23 03/16/2012, 05/16/2013   Tdap 12/19/2016   Zoster Recombinat (Shingrix) 12/19/2016, 02/27/2017    Past Medical History:  Diagnosis Date   Allergy    Dust, mold, dust mites   Anemia    Asthma    Cancer ([place])    prostate   Cataract    bilateral repair.   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   albuterol (PROVENTIL) (2.5 MG/3ML) 0.083% nebulizer solution TAKE 3 MLS BY NEBULIZATION EVERY 4 HOURS AS NEEDED FOR WHEEZING OR SHORTNESS OF BREATH (((PLAN B)))   albuterol (VENTOLIN HFA) 108 (90 Base) MCG/ACT inhaler Inhale 2 puffs into the lungs every 4 hours as needed for shortness of breath or wheezing   amLODipine (NORVASC) 5 MG tablet Take 1 tablet (5 mg total) by mouth in the morning and at bedtime.   ASPIRIN 81 PO Take 81 mg by mouth daily.   Blood Glucose Monitoring Suppl (TRUE METRIX METER) w/Device KIT Use as directed   busPIRone (BUSPAR) 10 MG tablet Take 1 tablet (10 mg total) by mouth 3 (three) times daily. (Patient taking differently: Take 10 mg by mouth 3 (three) times daily as needed (anxiety).)   Cholecalciferol (VITAMIN D PO) Take 1 tablet by mouth daily.   diazepam (VALIUM) 10 MG tablet Take 1 tablet (10 mg total) by mouth 60 (sixty) minutes before procedure for 1 dose. Take with a sip of water, on an empty stomach.   ezetimibe (ZETIA) 10 MG tablet Take 1 tablet (10 mg total) by mouth daily.   Fluticasone-Umeclidin-Vilant (TRELEGY ELLIPTA) 200-62.5-25 MCG/ACT AEPB Inhale 1 puff into the lungs daily.   gabapentin (NEURONTIN) 300 MG capsule take 1 capsule by mouth in the morning, then take 1 at lunch and 2 at bedtime (Patient taking differently: Take 300 mg by mouth 3 (three) times daily as needed (Nerve pain).)   glucose blood (TRUE METRIX BLOOD GLUCOSE TEST) test strip Use as instructed   hydrochlorothiazide (HYDRODIURIL) 25 MG tablet TAKE 1 TABLET BY MOUTH EVERY DAY   ipratropium (ATROVENT) 0.06 % nasal spray Place 2 sprays into both nostrils 3 (three)  times daily. As needed for nasal congestion, runny nose    ipratropium-albuterol (DUONEB) 0.5-2.5 (3) MG/3ML SOLN INHALE 1 VIAL VIA NEBULIZER TWICE A DAY   lidocaine (LIDODERM) 5 % Apply 1 patch to affected area daily and leave on for 12 hours at a time.  Remove after 12 hours   linaclotide (LINZESS) 72 MCG capsule Take 1 capsule (72 mcg total) by mouth daily before breakfast.   metFORMIN (GLUCOPHAGE-XR) 500 MG 24 hr tablet Take 2 tablets (1000 mg total) by mouth in the morning and 1 tablet (500 mg total) by mouth in the evening with meals.   montelukast (SINGULAIR) 10 MG tablet Take 1 tablet (10 mg total) by mouth at bedtime.   Nebivolol HCl 20 MG TABS Take 1 tablet (20 mg total) by mouth 2 (two) times daily.   pantoprazole (PROTONIX) 40 MG tablet Take 1 tablet (40 mg total) by mouth daily as needed.   potassium chloride SA (KLOR-CON M) 20 MEQ tablet Take 1 tablet (20 mEq total) by mouth daily as needed (When hands cramp up).   predniSONE (DELTASONE) 10 MG tablet TAKE 1-3 TABLETS BY MOUTH AS NEEDED FOR CHEST TIGHTNESS OR WHEEZE OR WORSENING SHORTNESS OF BREATH   predniSONE (DELTASONE) 20 MG tablet Take 2 tablets (40 mg total) by mouth daily with breakfast for 5 days, THEN 1 tablet (20 mg total) daily with breakfast for 5 days.   Respiratory Therapy Supplies (FLUTTER) DEVI Use three times a day after inhaler or nebulizer use   rosuvastatin (CRESTOR) 20 MG tablet Take 1 tablet (20 mg total) by mouth daily.   silver sulfADIAZINE (SILVADENE) 1 % cream Apply twice a day to affected area   traMADol (ULTRAM) 50 MG tablet Take 1 tablet (50 mg total) by mouth every evening. Each prescription to last 1 mth   TRUEplus Lancets 28G MISC Use as directed   valsartan (DIOVAN) 320 MG tablet Take 1 tablet (320 mg total) by mouth daily.   No facility-administered encounter medications on file as of 07/25/2022.     Review of Systems  Review of Systems  N/a Physical Exam  BP 126/70 (BP Location: Left Arm, Patient Position: Sitting, Cuff Size: Normal)   Pulse 86   Wt  247 lb 3.2 oz (112.1 kg)   SpO2 98%   BMI 36.51 kg/m   Wt Readings from Last 5 Encounters:  07/25/22 247 lb 3.2 oz (112.1 kg)  07/04/22 246 lb 9.6 oz (111.9 kg)  04/18/22 239 lb 12.8 oz (108.8 kg)  02/28/22 242 lb 3.2 oz (109.9 kg)  02/23/22 240 lb (108.9 kg)    BMI Readings from Last 5 Encounters:  07/25/22 36.51 kg/m  07/04/22 36.42 kg/m  04/18/22 35.41 kg/m  02/28/22 35.77 kg/m  02/23/22 35.44 kg/m     Physical Exam General: Well-appearing, sitting up in exam chair Eyes: EOMI, icterus Neck: No JVP appreciated, neck supple, Respiratory: clear, no wheeze, NWOB  cardiovascular: Regular rate, regular rhythm, no murmurs Abdomen: Nondistended, bowel sounds present MSK: No joint effusion, no synovitis Neuro: Normal gait, no weakness Psych: Normal mood, full affect    Assessment & Plan:   Dyspnea on exertion: Suspect multifactorial.  Asthma as a contributor.   Further discussion as below regarding asthma.  Other contributors include suspected anxiety/hyperventilation syndrome, obesity, deconditioning, and volume overload.  Frothy sputum, orthopnea endorsed.  Suspect recent worsening due to volume overload.  He states his diuretic dosing was decreased in the past.  However, also seems to coincide with emergence of  flowers, pollens.  Clear prescription of sense as well.  As triggers.  Prednisone 40 mg for 5 days then 20 mg 5 days then stop.  If not improving certainly strong argument for volume overload.  Has upcoming appoint with cardiologist, advised to discuss diuretic regimen.  Asthma: He has atopic symptoms.  Improved with triple inhaled therapy in the past.  Intermittently has access appropriate triple inhaled therapies but intermittently too expensive, loses access.  Trelegy more recently, and proved symptoms.  Prednisone as above for dyspnea, congestion.  Discussed the role for biologic therapy and poorly controlled asthma.  He is currently smoking so not a candidate.   Encouraged to stop smoking.   Return in about 3 months (around 10/25/2022).   Lanier Clam, MD 07/25/2022  I spent 41 minutes in the care of the patient including review, coordination of care.

## 2022-07-25 NOTE — Patient Instructions (Signed)
Nice to see you again  Take prednisone 40 mg for 5 days then 20 mg for 5 days then stop  You do local swelling, I think based on description of your sputum and your swelling that some of this shortness of breath is certainly related to fluid overload  If the prednisone knocks everything out great, if not then make sure you discuss with your cardiologist the extra fluid and the shortness of breath that has not gotten better despite prednisone.  Return to clinic in 3 months or sooner if needed with Dr. Silas Flood

## 2022-07-27 ENCOUNTER — Encounter: Payer: Self-pay | Admitting: Cardiovascular Disease

## 2022-07-27 ENCOUNTER — Ambulatory Visit: Payer: Medicare HMO | Attending: Cardiovascular Disease | Admitting: Cardiovascular Disease

## 2022-07-27 VITALS — BP 136/76 | HR 87 | Ht 69.0 in | Wt 249.0 lb

## 2022-07-27 DIAGNOSIS — I10 Essential (primary) hypertension: Secondary | ICD-10-CM

## 2022-07-27 DIAGNOSIS — E1169 Type 2 diabetes mellitus with other specified complication: Secondary | ICD-10-CM | POA: Diagnosis not present

## 2022-07-27 DIAGNOSIS — E669 Obesity, unspecified: Secondary | ICD-10-CM

## 2022-07-27 DIAGNOSIS — J4540 Moderate persistent asthma, uncomplicated: Secondary | ICD-10-CM | POA: Diagnosis not present

## 2022-07-27 DIAGNOSIS — I422 Other hypertrophic cardiomyopathy: Secondary | ICD-10-CM | POA: Diagnosis not present

## 2022-07-27 DIAGNOSIS — G4733 Obstructive sleep apnea (adult) (pediatric): Secondary | ICD-10-CM | POA: Diagnosis not present

## 2022-07-27 DIAGNOSIS — Z6835 Body mass index (BMI) 35.0-35.9, adult: Secondary | ICD-10-CM

## 2022-07-27 NOTE — Progress Notes (Signed)
Cardiology Office Note    Date:  08/01/2022   ID:  Normal [name], DOB 1955-06-02, MRN ID:[id number]  PCP:  Ladell Pier, MD  Cardiologist:  Shelva Majestic, MD (sleep); Dr. Gardiner Rhyme  15 month F/U sleep evaluation   History of Present Illness:  Joseph Harvey is a 67 y.o. male who is followed by Dr. Oswaldo Milian for his cardiology care.  I saw him for an initial sleep evaluation on December 011, 2022. He presents for follow-up evaluation.  Joseph Joseph Harvey has a history of prostate cancer in remission, type 2 diabetes mellitus, hypertension, hyperlipidemia, and tobacco abuse.  He has been demonstrated to have severe septal hypertrophy with otherwise mild concentric LVH and mild intracavitary gradient with peak gradient 30 mm with normal biventricular function and grade 1 diastolic dysfunction.  He had normal perfusion on a Lexiscan Myoview study.  He has had documented episodes of several beats of NSVT on prior monitor.  A calcium score was 117 and on cardiac MRI findings were consistent with hypertrophic cardiomyopathy with asymmetric septal hypertrophy measuring 20 mm in the basal anterior septum and 8 mm in the posterior wall.  Due to concerns for obstructive sleep apnea with symptoms of snoring, awakening gasping for breath, frequent awakenings, nonrestorative sleep, and nocturia 2-3 times per night he was referred for an initial home sleep study on November 06, 2020.  This revealed severe sleep apnea with an overall AHI at 37.6/h.  He had absent supine sleep on that study.  There was significant oxygen desaturation to nadir 74%.  He subsequently underwent a CPAP titration on December 28, 2020 and was titrated only up to 10 cm of water.  AHI was still elevated at 10.8 and O2 nadir was 89%.  As result it was recommended he initiate CPAP auto therapy at a range of 11 to 20 cm of water.  He received a new ResMed air sense 11 AutoSet unit on January 19, 2021 with [employer] as his DME company.  His  initial download from September 6 through February 17, 2021 showed that he was meeting compliance standards with 100% usage days with average use at 5 hours and 53 minutes.  His 95th percentile pressure was 15.2.  He had moderate mask leak.  His subsequent download was obtained from November 1 through April 14, 2021.  He continues to meet compliance standards and now average use is improved at 7 hours and 31 minutes.  AHI is 2.9 but he has significant mask leak on a daily basis with 95th percentile leak at 78.7 with threshold at 24.0 L/min.  I saw him for my initial sleep evaluation on April 15, 2021.  Since initiating CPAP therapy, Joseph. Harvey sleeping is significantly improved.  He lives by himself.  He typically goes to bed around 7 PM but is up by 4 AM.  Presently he is unaware of breakthrough snoring.  His sleep is more restorative.  He does have occasional PVCs.  He denies any bruxism, hypnagogic hallucinations or cataplectic events.  During that evaluation I had an extensive discussion with him regarding potential adverse consequences of sleep apnea on his cardiovascular health.  I also discussed numerous mask options with him and recommended a trial of the ResMed AirFit F30i mask.  I changed his initial ramp time down to 15 seconds and increased start pressure at 6.  I change his pressure settings to a range of 11 to 16 cm.  Since I last saw him, he  has continued to do well with CPAP therapy.  Apparently, he will be changing to [place] for his DME with a change of his insurance to [employer].  A download was obtained from December 12 through July 26, 2022.  Compliance was excellent with 100% use.  At his pressure range of 11 to 16 cm of water, AHI was 2.0 and his 95th percentile pressure was 14.7 with maximum average pressure at 15.3.  He believes he is sleeping well.  Since his prior evaluation he had undergone an ablation procedure by Dr. Curt Bears on August 10, 2021 has been maintaining sinus rhythm.  He  has continued to see Dr. Gardiner Rhyme for cardiology care and Karle Plumber for primary care.  He presents for follow-up evaluation  Past Medical History:  Diagnosis Date   Allergy    Dust, mold, dust mites   Anemia    Asthma    Cancer ([place])    prostate   Cataract    bilateral repair.   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   Pneumonia     Past Surgical History:  Procedure Laterality Date   BACK SURGERY  2017   CATARACT EXTRACTION Bilateral    COLONOSCOPY     2013   COLONOSCOPY  10/03/2019   KNEE ARTHROSCOPY  2005   right   PROSTATE SURGERY     PVC ABLATION N/A 08/10/2021   Procedure: PVC ABLATION;  Surgeon: Constance Haw, MD;  Location: [facility];  Service: Cardiovascular;  Laterality: N/A;   ROBOT ASSISTED LAPAROSCOPIC RADICAL PROSTATECTOMY  03/2010    Current Medications: Outpatient Medications Prior to Visit  Medication Sig Dispense Refill   acetaminophen (TYLENOL) 325 MG tablet Take 2 tablets (650 mg total) by mouth every 4 (four) hours as needed for headache or mild pain.     albuterol (PROVENTIL) (2.5 MG/3ML) 0.083% nebulizer solution TAKE 3 MLS BY NEBULIZATION EVERY 4 HOURS AS NEEDED FOR WHEEZING OR SHORTNESS OF BREATH (((PLAN B))) 375 mL 6   albuterol (VENTOLIN HFA) 108 (90 Base) MCG/ACT inhaler Inhale 2 puffs into the lungs every 4 hours as needed for shortness of breath or wheezing 18 g 5   amLODipine (NORVASC) 5 MG tablet Take 1 tablet (5 mg total) by mouth in the morning and at bedtime. 180 tablet 1   ASPIRIN 81 PO Take 81 mg by mouth daily.     Blood Glucose Monitoring Suppl (TRUE METRIX METER) w/Device KIT Use as directed 1 kit 0   busPIRone (BUSPAR) 10 MG tablet Take 1 tablet (10 mg total) by mouth 3 (three) times daily. (Patient taking differently: Take 10 mg by mouth 3 (three) times daily as needed (anxiety).) 90  tablet 3   Cholecalciferol (VITAMIN D PO) Take 1 tablet by mouth daily.     diazepam (VALIUM) 10 MG tablet Take 1 tablet (10 mg total) by mouth 60 (sixty) minutes before procedure for 1 dose. Take with a sip of water, on an empty stomach. 1 tablet 0   ezetimibe (ZETIA) 10 MG tablet Take 1 tablet (10 mg total) by mouth daily. 90 tablet 3   Fluticasone-Umeclidin-Vilant (TRELEGY ELLIPTA) 200-62.5-25 MCG/ACT AEPB Inhale 1 puff into the lungs daily. 60 each 3   gabapentin (NEURONTIN) 300 MG capsule take 1 capsule by mouth in the morning, then take 1 at lunch  and 2 at bedtime (Patient taking differently: Take 300 mg by mouth 3 (three) times daily as needed (Nerve pain).) 360 capsule 3   glucose blood (TRUE METRIX BLOOD GLUCOSE TEST) test strip Use as instructed 100 each 12   hydrochlorothiazide (HYDRODIURIL) 25 MG tablet TAKE 1 TABLET BY MOUTH EVERY DAY 90 tablet 1   ipratropium (ATROVENT) 0.06 % nasal spray Place 2 sprays into both nostrils 3 (three) times daily. As needed for nasal congestion, runny nose 15 mL 6   ipratropium-albuterol (DUONEB) 0.5-2.5 (3) MG/3ML SOLN INHALE 1 VIAL VIA NEBULIZER TWICE A DAY 90 mL 3   lidocaine (LIDODERM) 5 % Apply 1 patch to affected area daily and leave on for 12 hours at a time.  Remove after 12 hours 30 patch 0   linaclotide (LINZESS) 72 MCG capsule Take 1 capsule (72 mcg total) by mouth daily before breakfast. 30 capsule 6   metFORMIN (GLUCOPHAGE-XR) 500 MG 24 hr tablet Take 2 tablets (1000 mg total) by mouth in the morning and 1 tablet (500 mg total) by mouth in the evening with meals. 270 tablet 1   montelukast (SINGULAIR) 10 MG tablet Take 1 tablet (10 mg total) by mouth at bedtime. 30 tablet 3   Nebivolol HCl 20 MG TABS Take 1 tablet (20 mg total) by mouth 2 (two) times daily. 60 tablet 2   pantoprazole (PROTONIX) 40 MG tablet Take 1 tablet (40 mg total) by mouth daily as needed. 30 tablet 2   potassium chloride SA (KLOR-CON M) 20 MEQ tablet Take 1 tablet (20 mEq  total) by mouth daily as needed (When hands cramp up). 60 tablet 2   predniSONE (DELTASONE) 10 MG tablet TAKE 1-3 TABLETS BY MOUTH AS NEEDED FOR CHEST TIGHTNESS OR WHEEZE OR WORSENING SHORTNESS OF BREATH 20 tablet 0   predniSONE (DELTASONE) 20 MG tablet Take 2 tablets (40 mg total) by mouth daily with breakfast for 5 days, THEN 1 tablet (20 mg total) daily with breakfast for 5 days. 15 tablet 0   Respiratory Therapy Supplies (FLUTTER) DEVI Use three times a day after inhaler or nebulizer use 1 each 0   rosuvastatin (CRESTOR) 20 MG tablet Take 1 tablet (20 mg total) by mouth daily. 90 tablet 3   silver sulfADIAZINE (SILVADENE) 1 % cream Apply twice a day to affected area 50 g 0   traMADol (ULTRAM) 50 MG tablet Take 1 tablet (50 mg total) by mouth every evening. Each prescription to last 1 mth 30 tablet 2   TRUEplus Lancets 28G MISC Use as directed 100 each 4   valsartan (DIOVAN) 320 MG tablet Take 1 tablet (320 mg total) by mouth daily. 90 tablet 3   No facility-administered medications prior to visit.     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[employer]  Tobacco Use   Smoking status: Every Day    Packs/day: 0.75    Years: 46.00    Additional pack years: 0.00    Total pack years: 34.50    Types: Cigarettes    Start date: 1976   Smokeless tobacco: Never   Tobacco comments:    still smoking 0.5 ppd  Vaping Use   Vaping Use: Never used  Substance and Sexual Activity   Alcohol use: Not Currently    Alcohol/week: 3.0 standard drinks of alcohol    Types: 3 Cans of beer per week   Drug use: No   Sexual activity: Not on file  Other Topics Concern   Not on file  Social History  Narrative   Not on file   Social Determinants of Health   Financial Resource Strain: Not on file  Food Insecurity: Not on file  Transportation Needs: Not on file  Physical Activity: Not on file  Stress: No Stress Concern Present (05/26/2020)   [place]    Feeling of Stress : Only a little  Social Connections: Not on [employer], he was born in [place], [place] and lived in [place] and [place].  He is divorced and widowed.  He has 2 daughters ages 47 and 67, and 5 grandchildren.  He previously had driven a cab.  Family History:  The patient's family history includes Allergies in his brother; Asthma in his mother; Colon cancer in his father; Diabetes in his maternal grandmother; Lung cancer in his father and mother; Prostate cancer in his father.  Father died at age 47 with cancer.  Mother died at age 75 with lung cancer.   ROS General: Negative; No fevers, chills, or night sweats;  HEENT: Negative; No changes in vision or hearing, sinus congestion, difficulty swallowing; dentures, with a partial on the bottom Pulmonary: Negative; No cough, wheezing, shortness of breath, hemoptysis Cardiovascular: Hypertrophic cardiomyopathy, PVCs, with previously documented short episodes of NSVT GI: Negative; No nausea, vomiting, diarrhea, or abdominal pain GU: Negative; No dysuria, hematuria, or difficulty voiding Musculoskeletal: Negative; no myalgias, joint pain, or weakness Hematologic/Oncology: Negative; no easy bruising, bleeding Endocrine: Negative; no heat/cold intolerance; no diabetes Neuro: 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   PHYSICAL EXAM:   VS:  BP 136/76   Pulse 87   Ht 5\' 9"  (1.753 m)   Wt 249 lb (112.9 kg)   SpO2 97%   BMI 36.77 kg/m      Repeat blood pressure by me was 124/78  Wt Readings from Last 3 Encounters:  07/27/22 249 lb (112.9 kg)  07/25/22 247 lb 3.2 oz (112.1 kg)  07/04/22 246 lb 9.6 oz (111.9 kg)    General: Alert, oriented, no distress.  Skin: normal turgor, no rashes, warm and dry HEENT: Normocephalic, atraumatic. Pupils equal round and reactive to light; sclera anicteric; extraocular muscles intact;  Nose without nasal septal hypertrophy Mouth/Parynx: Large tongue; Mallinpatti scale 4 Neck: Thick neck; no JVD, no carotid bruits; normal carotid upstroke Lungs: clear to ausculatation and percussion; no wheezing or rales Chest wall: without tenderness to palpitation Heart: PMI not displaced, RRR, s1 s2 normal, 1/6 systolic murmur, no diastolic murmur, no rubs, gallops, thrills, or heaves Abdomen: soft, nontender; no hepatosplenomehaly, BS+; abdominal aorta nontender and not dilated by palpation. Back: no CVA tenderness Pulses 2+ Musculoskeletal: full range of motion, normal strength, no joint deformities Extremities: no clubbing cyanosis or edema, Homan's sign negative  Neurologic: grossly nonfocal; Cranial nerves grossly wnl Psychologic: Normal mood and affect     Studies/Labs Reviewed:   July 27, 2022 ECG (independently read by me):  NSR at 87, QS V1-2, no ectopy, normal intervals  EKG:  EKG is not ordered today.  I personally reviewed the ECG from March 25, 2021 which showed sinus rhythm with frequent PVCs in a transient trigeminal pattern.  Recent Labs:    Latest Ref Rng & Units 02/01/2022    9:17 AM 09/24/2021    9:34 AM 08/03/2021    7:53 AM  BMP  Glucose 70 - 99 mg/dL 108  100  117   BUN 8 - 27 mg/dL 14  13  13    Creatinine 0.76 - 1.27 mg/dL 1.22  1.24  1.21   BUN/Creat Ratio 10 - 24 11  10  11    Sodium 134 - 144 mmol/L 142  141  139   Potassium 3.5 - 5.2 mmol/L 3.7  3.5  4.1   Chloride 96 - 106 mmol/L 101  99  96   CO2 20 - 29 mmol/L 28  28  28    Calcium 8.6 - 10.2 mg/dL 9.2   9.5  9.6         Latest Ref Rng & Units 09/24/2021    9:34 AM 03/25/2021    9:26 AM 04/14/2020   10:36 AM  Hepatic Function  Total Protein 6.0 - 8.5 g/dL 7.0  6.5  7.3   Albumin 3.8 - 4.8 g/dL 4.6  4.3  4.6   AST 0 - 40 IU/L 21  14  16    ALT 0 - 44 IU/L 27  17  19    Alk Phosphatase 44 - 121 IU/L 71  68  76   Total Bilirubin 0.0 - 1.2 mg/dL 0.8  0.4  0.5        Latest Ref Rng & Units 12/17/2021    3:26 PM 09/24/2021    9:34 AM 08/03/2021    7:53 AM  CBC  WBC 3.4 - 10.8 x10E3/uL 12.2  8.4  9.3   Hemoglobin 13.0 - 17.7 g/dL 14.1  15.3  15.1   Hematocrit 37.5 - 51.0 % 40.6  43.2  44.6   Platelets 150 - 450 x10E3/uL 345  380  355    Lab Results  Component Value Date   MCV 92 12/17/2021   MCV 89 09/24/2021   MCV 92 08/03/2021   Lab Results  Component Value Date   TSH 0.718 06/15/2021   Lab Results  Component Value Date   HGBA1C 8.0 (A) 07/04/2022     BNP    Component Value Date/Time   BNP 15.1 09/24/2021 0934   BNP 25.1 07/24/2014 1256    ProBNP    Component Value Date/Time   PROBNP 336 (H) 09/16/2021 1422     Lipid Panel     Component Value Date/Time   CHOL 94 (L) 02/01/2022 0917   TRIG 149 02/01/2022 0917   HDL 45 02/01/2022 0917   CHOLHDL 2.1 02/01/2022 0917   LDLCALC 24 02/01/2022 0917   LABVLDL 25 02/01/2022 0917     RADIOLOGY: No results found.   Additional studies/ records that were reviewed today include:     11/06/2020 CLINICAL INFORMATION Sleep Study Type: HST   Indication for sleep study: snoring, obesity,    Epworth Sleepiness Score: 5   SLEEP STUDY TECHNIQUE A multi-channel overnight portable sleep study was performed. The channels recorded were: nasal airflow, thoracic respiratory movement, and oxygen saturation with a pulse oximetry. Snoring was also monitored.   MEDICATIONS ezetimibe (ZETIA) 10 MG tablet albuterol (PROVENTIL) (2.5 MG/3ML) 0.083% nebulizer solution albuterol (VENTOLIN HFA) 108 (90 Base) MCG/ACT  inhaler amLODipine (NORVASC) 5 MG tablet ASPIRIN  81 PO baclofen (LIORESAL) 10 MG tablet Blood Glucose Monitoring Suppl (TRUE METRIX METER) w/Device KIT Budeson-Glycopyrrol-Formoterol (BREZTRI AEROSPHERE) 160-9-4.8 MCG/ACT AERO Budeson-Glycopyrrol-Formoterol (BREZTRI AEROSPHERE) 160-9-4.8 MCG/ACT AERO buPROPion (WELLBUTRIN XL) 150 MG 24 hr tablet busPIRone (BUSPAR) 10 MG tablet Cholecalciferol (VITAMIN D PO) fluticasone (FLONASE) 50 MCG/ACT nasal spray gabapentin (NEURONTIN) 300 MG capsule glucose blood (TRUE METRIX BLOOD GLUCOSE TEST) test strip ipratropium-albuterol (DUONEB) 0.5-2.5 (3) MG/3ML SOLN LORazepam (ATIVAN) 1 MG tablet metFORMIN (GLUCOPHAGE-XR) 500 MG 24 hr tablet montelukast (SINGULAIR) 10 MG tablet Nebivolol HCl 20 MG TABS pantoprazole (PROTONIX) 40 MG tablet polyethylene glycol powder (GLYCOLAX/MIRALAX) 17 GM/SCOOP powder potassium chloride SA (KLOR-CON) 20 MEQ tablet Respiratory Therapy Supplies (FLUTTER) DEVI rosuvastatin (CRESTOR) 40 MG tablet TRUEplus Lancets 28G MISC valsartan (DIOVAN) 320 MG tablet Patient self administered medications include: N/A.   SLEEP ARCHITECTURE Patient was studied for 453.3 minutes. The sleep efficiency was 99.9 % and the patient was supine for 0%. The arousal index was 0.0 per hour.   RESPIRATORY PARAMETERS The overall AHI was 37.6 per hour, with a central apnea index of 0 per hour. Supine sleep was absent.   The oxygen nadir was 74% during sleep.   CARDIAC DATA Mean heart rate during sleep was 78.3 bpm.   IMPRESSIONS - Severe obstructive sleep apnea occurred during this study (AHI 37.6/h).  - Severe oxygen desaturation to a nadir of 74%. - Patient snored 35.7% during the sleep.   DIAGNOSIS - Obstructive Sleep Apnea (G47.33) - Nocturnal Hypoxemia (G47.36)   RECOMMENDATIONS - In this patient with significant cardiovascular co-morbidities with severe sleep apnea and nocturnal oxygen desaturation recommend an in-lab CPAP  titration study. - Effort should be made to optimize nasal and oropharyngeal patency. - Avoid alcohol, sedatives and other CNS depressants that may worsen sleep apnea and disrupt normal sleep architecture. - Sleep hygiene should be reviewed to assess factors that may improve sleep quality. - Weight management and regular exercise should be initiated or continued. - Recommend a download after 30 days and sleep clinic evaluation after one month of therapy.     12/28/2020 CLINICAL INFORMATION The patient is referred for a CPAP titration to treat sleep apnea.   Date of HST:  11/06/2020:  AHI 37.6/h; O2 nadir 74%.   SLEEP STUDY TECHNIQUE As per the AASM Manual for the Scoring of Sleep and Associated Events v2.3 (April 2016) with a hypopnea requiring 4% desaturations.   The channels recorded and monitored were frontal, central and occipital EEG, electrooculogram (EOG), submentalis EMG (chin), nasal and oral airflow, thoracic and abdominal wall motion, anterior tibialis EMG, snore microphone, electrocardiogram, and pulse oximetry. Continuous positive airway pressure (CPAP) was initiated at the beginning of the study and titrated to treat sleep-disordered breathing.   MEDICATIONS ezetimibe (ZETIA) 10 MG tablet (Expired) albuterol (PROVENTIL) (2.5 MG/3ML) 0.083% nebulizer solution albuterol (VENTOLIN HFA) 108 (90 Base) MCG/ACT inhaler amLODipine (NORVASC) 5 MG tablet ASPIRIN 81 PO baclofen (LIORESAL) 10 MG tablet Blood Glucose Monitoring Suppl (TRUE METRIX METER) w/Device KIT Budeson-Glycopyrrol-Formoterol (BREZTRI AEROSPHERE) 160-9-4.MCG/ACT AERO buPROPion (WELLBUTRIN XL) 150 MG 24 hr tablet busPIRone (BUSPAR) 10 MG tablet Cholecalciferol (VITAMIN D PO) fluticasone (FLONASE) 50 MCG/ACT nasal spray gabapentin (NEURONTIN) 300 MG capsule gabapentin (NEURONTIN) 300 MG capsule glucose blood (TRUE METRIX BLOOD GLUCOSE TEST) test strip ipratropium-albuterol (DUONEB) 0.5-2.5 (3) MG/3ML  SOLN LORazepam (ATIVAN) 1 MG tablet metFORMIN (GLUCOPHAGE-XR) 500 MG 24 hr tablet montelukast (SINGULAIR) 10 MG tablet Nebivolol HCl 20 MG TABS pantoprazole (PROTONIX) 40 MG tablet polyethylene glycol powder (GLYCOLAX/MIRALAX) 17 GM/SCOOP powder potassium  chloride SA (KLOR-CON) 20 MEQ tablet Respiratory Therapy Supplies (FLUTTER) DEVI rosuvastatin (CRESTOR) 40 MG tablet TRUEplus Lancets 28G MISC valsartan (DIOVAN) 320 MG tablet zolpidem (AMBIEN) 10 MG tablet Medications self-administered by patient taken the night of the study : N/A   TECHNICIAN COMMENTS Comments added by technician: CPAP therapy started at 4 cm of H20 and increased to 10 cm of H2O due to events in REM stages. Suboptimal pressure obtained due to hyponiec events noticed in REM stages but there were no significant O2 desats. Patient tolerated CPAP fairly well. Patient took one Ambien of 10 mg prior to lights off time. EKG = Frequent PVC's noticed Comments added by scorer: N/A   RESPIRATORY PARAMETERS Optimal PAP Pressure (cm):   AHI at Optimal Pressure (/hr):            N/A Overall Minimal O2 (%):         81.00   Supine % at Optimal Pressure (%):    N/A Minimal O2 at Optimal Pressure (%): 81.00      SLEEP ARCHITECTURE The study was initiated at 10:22:36 PM and ended at 4:19:30 AM.   Sleep onset time was 5.8 minutes and the sleep efficiency was 69.9%. The total sleep time was 249.5 minutes.   The patient spent 9.22% of the night in stage N1 sleep, 66.93% in stage N2 sleep, 10.42% in stage N3 and 13.4% in REM.Stage REM latency was 95.5 minutes   Wake after sleep onset was 101.6. Alpha intrusion was absent. Supine sleep was 90.98%.   CARDIAC DATA The 2 lead EKG demonstrated sinus rhythm. The mean heart rate was 72.60 beats per minute. Other EKG findings include: PVCs.   LEG MOVEMENT DATA The total Periodic Limb Movements of Sleep (PLMS) were 0. The PLMS index was 0.00. A PLMS index of <15 is considered normal in  adults.   IMPRESSIONS - CPAP was initiated at 4 cm and was only titrated to 10 cm of water (AHI 10.8; O2 nadir 89%).  An optimal PAP pressure could not be selected for this patient based on the available study data. - Moderate oxygen desaturations were observed during this titration to a nadir of  81% at5 cm.  . - The patient snored with moderate snoring volume during this titration study. - 2-lead EKG demonstrated: PVCs - Clinically significant periodic limb movements were not noted during this study. Arousals associated with PLMs were rare.   DIAGNOSIS - Obstructive Sleep Apnea (G47.33)   RECOMMENDATIONS - Recommend an initial trial of CPAP Auto with EPR of 3 at 11-20 cm of water with heated humidification.  - Effort should be made to optimize nasal and oropharyngeal patency.  - Avoid alcohol, sedatives and other CNS depressants that may worsen sleep apnea and disrupt normal sleep architecture. - Sleep hygiene should be reviewed to assess factors that may improve sleep quality. - Weight management (BMI 35) and regular exercise should be initiated or continued. - Recommend a download in 30 days and sleep clinic evaluation after 4 weeks of therapy.   ASSESSMENT:    1. Obstructive sleep apnea   2. Hypertrophic cardiomyopathy ([place])   3. Primary hypertension   4. Class 2 severe obesity with serious comorbidity and body mass index (BMI) of 35.0 to 35.9 in adult, unspecified obesity type ([place])   5. Type 2 diabetes mellitus with obesity ([place])   6. Moderate persistent asthma without complication     PLAN:  Joseph. Joseph Harvey is a 67 year old African-American gentleman who has a  history of hypertrophic cardiomyopathy with mild intracavity gradient with a peak gradient of 30 mmHg, as well as a history of type 2 diabetes mellitus, hypertension, documented PVCs with palpitations, tobacco abuse and prostate cancer in remission.  He had had poor sleep with loud snoring, frequent awakenings,  nonrestorative sleep, as well as nocturia occurring 2-3 times per night.  He was found to have severe obstructive sleep apnea on a home study and subsequently underwent CPAP titration.  Since initiating CPAP therapy on January 19, 2021, he has noted improvement in his sleep.  When seen on his initial evaluation in December 2022 his initial download was obtained from September 6 through February 17, 2021 where usage was 100% but at that time average use was just shy of 6 hours at 5 hours and 53 minutes.  AHI was 3.9 with a pressure range of 11-20.  A subsequent download from November 2022, was improved and he was sleeping 7-1/2 hours on average per night.  AHI is 2.9 which I suspect is contributing by significant mask leak.  During his initial evaluation, I had a lengthy discussion with him and discussed the effects of untreated sleep apnea causing disruptive sleep architecture and his potential adverse cardiovascular consequences.  Over the past year, his compliance has been excellent.  Most recent download showed 100% use with average use at 8 hours and 7 minutes.  His insurance is changed and he will now be using [employer] as his DME company.  At his pressure settings of 11 to 16 cm of water, AHI is excellent at 2.0 with 95th percentile average pressure at 14.7 and maximum average pressure 15.3.  He had undergone PVC ablation ablation over the past year by Dr. Curt Bears.  His blood pressure today is stable on amlodipine 5 mg and hydrochlorothiazide 25 mg in addition to nebivolol 20 mg twice a day and valsartan 320 mg daily.  He is on rosuvastatin for hyperlipidemia.  He is on DuoNeb, montelukast, and Trelegy Ellipta for his asthma.  He has a follow-up appointment to see Dr. 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  I will see him in 1 year for follow-up evaluation or sooner as needed.    Medication Adjustments/Labs and Tests Ordered: Current medicines are reviewed at length with the  patient today.  Concerns regarding medicines are outlined above.  Medication changes, Labs and Tests ordered today are listed in the Patient Instructions below. Patient Instructions  Medication Instructions:  Your physician recommends that you continue on your current medications as directed. Please refer to the Current Medication list given to you today.  *If you need a refill on your cardiac medications before your next appointment, please call your pharmacy*  Follow-Up: At [facility], you and your health needs are our priority.  As part of our continuing mission to provide you with exceptional heart care, we have created designated Provider Care Teams.  These Care Teams include your primary Cardiologist (physician) and Advanced Practice Providers (APPs -  Physician Assistants and Nurse Practitioners) who all work together to provide you with the care you need, when you need it.  We recommend signing up for the patient portal called "MyChart".  Sign up information is provided on this After Visit Summary.  MyChart is used to connect with patients for Virtual Visits (Telemedicine).  Patients are able to view lab/test results, encounter notes, upcoming appointments, etc.  Non-urgent messages can be sent to your provider as well.   To learn  more about what you can do with MyChart, go to [email or web address].    Your next appointment:   12 month(s)  Provider:   Dr. Claiborne Billings (sleep clinic)    Signed, Shelva Majestic, MD  08/01/2022 1:22 PM    [place] [address] [place], [place],   [address] Phone: [phone]

## 2022-07-27 NOTE — Patient Instructions (Signed)
Medication Instructions:  Your physician recommends that you continue on your current medications as directed. Please refer to the Current Medication list given to you today.  *If you need a refill on your cardiac medications before your next appointment, please call your pharmacy*  Follow-Up: At [facility], you and your health needs are our priority.  As part of our continuing mission to provide you with exceptional heart care, we have created designated Provider Care Teams.  These Care Teams include your primary Cardiologist (physician) and Advanced Practice Providers (APPs -  Physician Assistants and Nurse Practitioners) who all work together to provide you with the care you need, when you need it.  We recommend signing up for the patient portal called "MyChart".  Sign up information is provided on this After Visit Summary.  MyChart is used to connect with patients for Virtual Visits (Telemedicine).  Patients are able to view lab/test results, encounter notes, upcoming appointments, etc.  Non-urgent messages can be sent to your provider as well.   To learn more about what you can do with MyChart, go to [email or web address].    Your next appointment:   12 month(s)  Provider:   Dr. Kelly-sleep clinic 

## 2022-08-01 ENCOUNTER — Other Ambulatory Visit: Payer: Self-pay | Admitting: Cardiology

## 2022-08-01 ENCOUNTER — Encounter: Payer: Self-pay | Admitting: Cardiovascular Disease

## 2022-08-07 NOTE — Progress Notes (Unsigned)
Cardiology Office Note:    Date:  08/08/2022   ID:  Joseph Harvey, DOB 06-10-1955, MRN [id number]  PCP:  Ladell Pier, MD  Cardiologist:  Donato Heinz, MD  Electrophysiologist:  Constance Haw, MD   Referring MD: Ladell Pier, MD   Chief Complaint  Patient presents with   Shortness of Breath    History of Present Illness:    Joseph Harvey is a 67 y.o. male with a hx of prostate cancer in remission, T2DM, hypertension, hyperlipidemia, [place]abbaco use who presents for follow-up.  He was referred by Dr. Wynetta Emery for evaluation of dyspnea on exertion, intially seen on 04/14/20.  Hospital he reports that he has been having chest pain with minimal exertion.  Also describes having chest pain.  Reports sharp left-sided chest pain, can occur at rest or with exertion.  Typically last for 10 minutes or so.  Also states he has been having occasional lightheadedness, denies any syncope.  Denies any lower extremity edema.  Reports BP has been anywhere from 110s to 160s when he checks at home.  Reports he had a prior test for sleep apnea which was negative.  He has smoked since age 10, less than 1 pack/day.  No known history of heart disease in his immediate family.  Echocardiogram on 03/18/2020 showed severe septal hypertrophy with otherwise mild concentric hypertrophy, mild intracavitary gradient (peak gradient 30 mmHg), normal biventricular function, grade 1 diastolic dysfunction, no significant valvular disease.  Lexiscan Myoview on 04/29/2020 showed normal perfusion, EF 57%.  Preventives monitor x3 days on 05/06/2020 showed 7 episodes of NSVT, longest lasting 3 beats, occasional PVCs (3.9% of beats).  Calcium score 117 on 04/29/2020 (81st percentile).  Cardiac MRI 06/23/2020 showed asymmetric hypertrophy measuring up to 20 mm in basal anterior septum (8 mm and posterior wall), consistent with hypertrophic cardiomyopathy.  Also with patchy LGE in basal septum and RV insertion sites  consistent with HCM; LGE accounts for 11% of total myocardial mass.  Normal LV/RV size and systolic function.  Echo on 04/14/2021 showed EF of 60 to 65%, severe asymmetric hypertrophy of basal septum, no LVOT obstruction, grade 1 diastolic dysfunction, normal RV function, no significant valvular disease.  Referred to EP for evaluation, underwent PVC ablation with Dr. Curt Bears 08/10/2021.  Repeat Zio patch x3 days on 10/07/2021 showed PVCs represent 1.6% of beats.  Since last clinic visit, he does report he has been having dyspnea on exertion.  Recently completed course of prednisone prescribed by his pulmonologist for his asthma.  He reports occasional sharp pain on left side of chest, no relationship with exertion.  Denies any palpitations.  Has been having swelling in both feet and ankles.  Continues to smoke about 0.5 packs/day.   Wt Readings from Last 3 Encounters:  08/08/22 247 lb 12.8 oz (112.4 kg)  07/27/22 249 lb (112.9 kg)  07/25/22 247 lb 3.2 oz (112.1 kg)     BP Readings from Last 3 Encounters:  08/08/22 120/72  07/27/22 136/76  07/25/22 126/70    Wt Readings from Last 3 Encounters:  08/08/22 247 lb 12.8 oz (112.4 kg)  07/27/22 249 lb (112.9 kg)  07/25/22 247 lb 3.2 oz (112.1 kg)     Past Medical History:  Diagnosis Date   Allergy    Dust, mold, dust mites   Anemia    Asthma    Cancer ([place])    prostate   Cataract    bilateral repair.   COVID    Diabetes mellitus  without complication ([place])    Glaucoma    Hyperlipidemia    Hypertension    Neuromuscular disorder ([place])    nerve damage from back surgery   Pneumonia     Past Surgical History:  Procedure Laterality Date   BACK SURGERY  2017   CATARACT EXTRACTION Bilateral    COLONOSCOPY     2013   COLONOSCOPY  10/03/2019   KNEE ARTHROSCOPY  2005   right   PROSTATE SURGERY     PVC ABLATION N/A 08/10/2021   Procedure: PVC ABLATION;  Surgeon: Constance Haw, MD;  Location: [facility];  Service:  Cardiovascular;  Laterality: N/A;   ROBOT ASSISTED LAPAROSCOPIC RADICAL PROSTATECTOMY  03/2010    Current Medications: Current Meds  Medication Sig   acetaminophen (TYLENOL) 325 MG tablet Take 2 tablets (650 mg total) by mouth every 4 (four) hours as needed for headache or mild pain.   albuterol (PROVENTIL) (2.5 MG/3ML) 0.083% nebulizer solution TAKE 3 MLS BY NEBULIZATION EVERY 4 HOURS AS NEEDED FOR WHEEZING OR SHORTNESS OF BREATH (((PLAN B)))   albuterol (VENTOLIN HFA) 108 (90 Base) MCG/ACT inhaler Inhale 2 puffs into the lungs every 4 hours as needed for shortness of breath or wheezing   ASPIRIN 81 PO Take 81 mg by mouth daily.   Blood Glucose Monitoring Suppl (TRUE METRIX METER) w/Device KIT Use as directed   busPIRone (BUSPAR) 10 MG tablet Take 1 tablet (10 mg total) by mouth 3 (three) times daily. (Patient taking differently: Take 10 mg by mouth 3 (three) times daily as needed (anxiety).)   Cholecalciferol (VITAMIN D PO) Take 1 tablet by mouth daily.   diazepam (VALIUM) 10 MG tablet Take 1 tablet (10 mg total) by mouth 60 (sixty) minutes before procedure for 1 dose. Take with a sip of water, on an empty stomach.   ezetimibe (ZETIA) 10 MG tablet Take 1 tablet (10 mg total) by mouth daily.   Fluticasone-Umeclidin-Vilant (TRELEGY ELLIPTA) 200-62.5-25 MCG/ACT AEPB Inhale 1 puff into the lungs daily.   gabapentin (NEURONTIN) 300 MG capsule take 1 capsule by mouth in the morning, then take 1 at lunch and 2 at bedtime (Patient taking differently: Take 300 mg by mouth 3 (three) times daily as needed (Nerve pain).)   glucose blood (TRUE METRIX BLOOD GLUCOSE TEST) test strip Use as instructed   hydrochlorothiazide (HYDRODIURIL) 25 MG tablet TAKE 1 TABLET BY MOUTH EVERY DAY   ipratropium (ATROVENT) 0.06 % nasal spray Place 2 sprays into both nostrils 3 (three) times daily. As needed for nasal congestion, runny nose   ipratropium-albuterol (DUONEB) 0.5-2.5 (3) MG/3ML SOLN INHALE 1 VIAL VIA NEBULIZER  TWICE A DAY   lidocaine (LIDODERM) 5 % Apply 1 patch to affected area daily and leave on for 12 hours at a time.  Remove after 12 hours   linaclotide (LINZESS) 72 MCG capsule Take 1 capsule (72 mcg total) by mouth daily before breakfast.   metFORMIN (GLUCOPHAGE-XR) 500 MG 24 hr tablet Take 2 tablets (1000 mg total) by mouth in the morning and 1 tablet (500 mg total) by mouth in the evening with meals.   montelukast (SINGULAIR) 10 MG tablet Take 1 tablet (10 mg total) by mouth at bedtime.   Nebivolol HCl 20 MG TABS Take 1 tablet (20 mg total) by mouth 2 (two) times daily.   pantoprazole (PROTONIX) 40 MG tablet Take 1 tablet (40 mg total) by mouth daily as needed.   potassium chloride SA (KLOR-CON M) 20 MEQ tablet Take  1 tablet (20 mEq total) by mouth daily as needed (When hands cramp up).   predniSONE (DELTASONE) 10 MG tablet TAKE 1-3 TABLETS BY MOUTH AS NEEDED FOR CHEST TIGHTNESS OR WHEEZE OR WORSENING SHORTNESS OF BREATH   Respiratory Therapy Supplies (FLUTTER) DEVI Use three times a day after inhaler or nebulizer use   rosuvastatin (CRESTOR) 20 MG tablet Take 1 tablet (20 mg total) by mouth daily.   silver sulfADIAZINE (SILVADENE) 1 % cream Apply twice a day to affected area   traMADol (ULTRAM) 50 MG tablet Take 1 tablet (50 mg total) by mouth every evening. Each prescription to last 1 mth   TRUEplus Lancets 28G MISC Use as directed   valsartan (DIOVAN) 320 MG tablet Take 1 tablet (320 mg total) by mouth daily.   [DISCONTINUED] amLODipine (NORVASC) 5 MG tablet Take 1 tablet (5 mg total) by mouth in the morning and at bedtime.     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[employer] [place]   Tobacco Use   Smoking status: Every Day    Packs/day: 0.75    Years: 46.00    Additional pack years: 0.00    Total pack years: 34.50    Types: Cigarettes    Start date: 1976   Smokeless tobacco: Never   Tobacco comments:    still smoking 0.5 ppd  Vaping Use   Vaping Use: Never used  Substance and Sexual Activity   Alcohol use: Not Currently    Alcohol/week: 3.0 standard drinks of alcohol    Types: 3 Cans of beer per week   Drug use: No   Sexual activity: Not on file  Other Topics Concern   Not on file  Social History Narrative   Not on file   Social Determinants of Health   Financial Resource Strain: Not on file  Food Insecurity: Not on file  Transportation Needs: Not on file  Physical Activity: Not on file  Stress: No Stress Concern Present (05/26/2020)   [place]    Feeling of Stress : Only a little  Social Connections: Not on file     Family History: The patient's family history includes Allergies in his brother; Asthma in his mother; Colon cancer in his father; Diabetes in his maternal grandmother; Lung cancer in his father and mother; Prostate cancer in his father. There is no history of Colon polyps, Esophageal cancer, Stomach cancer, or Rectal cancer.  ROS:   Please see the history of present illness.     All other systems reviewed and are negative.  EKGs/Labs/Other Studies Reviewed:    The following studies were reviewed today:   EKG:   09/24/21: Normal sinus rhythm, no PVCs, poor R wave progression 05/05/21: Normal sinus rhythm, PVCs, Q waves in V1/2 04/14/20: normal sinus rhythm, PVCs, no ST abnormalities 05/18/20: EKG was not ordered. 07/14/20: EKG was not ordered. 09/17/20: Sinus rhythm. Rate 96 bpm PVCs, No ST abnormalities. 03/25/21: Normal sinus rhythm, frequent PVCs (trigeminy)  Recent Labs: 09/16/2021: NT-Pro BNP 336 09/24/2021: ALT 27; BNP 15.1 12/17/2021: Hemoglobin 14.1; Platelets  345 02/01/2022: BUN 14; Creatinine, Ser 1.22; Potassium 3.7; Sodium  142  Recent Lipid Panel    Component Value Date/Time   CHOL 94 (L) 02/01/2022 0917   TRIG 149 02/01/2022 0917   HDL 45 02/01/2022 0917   CHOLHDL 2.1 02/01/2022 0917   LDLCALC 24 02/01/2022 0917    Physical Exam:    VS:  BP 120/72   Pulse 94   Ht 5\' 9"  (1.753 m)   Wt 247 lb 12.8 oz (112.4 kg)   SpO2 94%   BMI 36.59 kg/m     Wt Readings from Last 3 Encounters:  08/08/22 247 lb 12.8 oz (112.4 kg)  07/27/22 249 lb (112.9 kg)  07/25/22 247 lb 3.2 oz (112.1 kg)     GEN:  Well nourished, well developed in no acute distress HEENT: Normal NECK: No JVD; No carotid bruits LYMPHATICS: No lymphadenopathy CARDIAC: Irregular, no murmurs, rubs, gallops RESPIRATORY: Expiratory wheezing ABDOMEN: Soft, non-tender, non-distended MUSCULOSKELETAL: trace edema; No deformity  SKIN: Warm and dry NEUROLOGIC:  Alert and oriented x 3 PSYCHIATRIC:  Normal affect   ASSESSMENT:    1. Hypertrophic cardiomyopathy ([place])   2. SOB (shortness of breath)   3. Essential hypertension   4. Hyperlipidemia, unspecified hyperlipidemia type   5. PVC's (premature ventricular contractions)   6. Tobacco use       PLAN:    HCM: Cardiac MRI 06/23/2020 showed asymmetric hypertrophy measuring up to 20 mm and basal anterior septum (8 mm and posterior wall), consistent with hypertrophic cardiomyopathy.  Also with patchy LGE in basal septum and RV insertion sites consistent with HCM; LGE accounts for 11% of total myocardial mass.  Normal LV/RV size and systolic function.  Echocardiogram on 03/18/2020 showed mild LVOT obstruction, peak gradient 13 mmHg. -Recommended first-degree relatives be screened for HCM: He reports he has talked to his daughters and brother/sisters.  Discussed genetic testing but he declined -Ideally try to avoid diuretics in HCM but he is volume overloaded on exam.  Started on HCTZ 25 mg daily, repeat echo on 04/14/2021 showed no  LVOT obstruction -Continue nebivolol 20 mg BID -NSVT, frequent PVCs on Zio patch.  Referred to EP for evaluation, underwent PVC ablation with Dr Curt Bears on 08/10/21 -He reports worsening dyspnea on exertion recently.  Expiratory wheezing on exam, suspect related to asthma.  Follows with pulmonology recently started on course of prednisone and reported improvement in shortness of breath.  Will check echo to ensure no LVOT obstruction  Lower extremity edema: Bilateral swelling in feet/ankles.  Suspect related to amlodipine use.  Check CMET, BNP.  Repeat echocardiogram as above.  BP appears well-controlled, will decrease amlodipine to 5 mg daily.  Asked to check BP daily for next 2 weeks and let us know results  Chest pain: Atypical in description but does have CAD risk factors (hypertension, hyperlipidemia, T2DM).   Lexiscan Myoview on 04/29/2020 showed normal perfusion, EF 57%.  Calcium score 117 on 04/29/2020 (81st percentile).   PVCs: [employer] monitor x3 days on 05/06/2020 showed 7 episodes of NSVT, longest lasting 3 beats, occasional PVCs (3.9% of beats).  Zio patch x3 days on 04/06/2021 showed frequent PVCs (14.3% of beats), 1 episode of NSVT x4 beats -Referred to EP for evaluation, underwent PVC ablation with Dr. Curt Bears 08/10/2021.  Significantly improved, repeat Zio patch x3 days on 10/07/2021 showed PVCs represent 1.6% of beats.  Leg pain: Normal ABIs on 04/23/2019  Hypertension: On amlodipine 10 mg daily, HCTZ 25 mg daily, valsartan 99991111 mg, Bystolic 20 mg daily twice daily.  Appears controlled.  Decrease amlodipine to  5 mg daily given lower extremity edema as above.  Asked to check BP daily for next 2 weeks and let us know results  Tobacco use: Patient counseled on the risk of tobacco use and cessation strongly encouraged.    Hyperlipidemia: On rosuvastatin 40 mg daily and Zetia 10 mg daily, LDL 53 on 03/25/2021.  Calcium score 117 on 04/29/2020 (81st percentile).  LDL 24 08/22/1921,  rosuvastatin decreased to 20 mg daily.  Will check lipid panel  T2DM: On Metformin.  A1c 8% on 07/04/2022  OSA: Sleep study 10/2020 showed severe OSA, he is now on CPAP and reports compliance  RTC in 6 months   Medication Adjustments/Labs and Tests Ordered: Current medicines are reviewed at length with the patient today.  Concerns regarding medicines are outlined above.  Orders Placed This Encounter  Procedures   Comprehensive metabolic panel   Brain natriuretic peptide   Lipid panel   ECHOCARDIOGRAM COMPLETE    Meds ordered this encounter  Medications   amLODipine (NORVASC) 5 MG tablet    Sig: Take 1 tablet (5 mg total) by mouth daily.    Dispense:  90 tablet    Refill:  3    Dose change     Patient Instructions  Medication Instructions:  DECREASE amlodipine to 5 mg daily  *If you need a refill on your cardiac medications before your next appointment, please call your pharmacy*   Lab Work: CMET, BNP, Lipid today  If you have labs (blood work) drawn today and your tests are completely normal, you will receive your results only by: MyChart Message (if you have MyChart) OR A paper copy in the mail If you have any lab test that is abnormal or we need to change your treatment, we will call you to review the results.   Testing/Procedures: Your physician has requested that you have an echocardiogram. Echocardiography is a painless test that uses sound waves to create images of your heart. It provides your doctor with information about the size and shape of your heart and how well your heart's chambers and valves are working. This procedure takes approximately one hour. There are no restrictions for this procedure. Please do NOT wear cologne, perfume, aftershave, or lotions (deodorant is allowed). Please arrive 15 minutes prior to your appointment time.  Follow-Up: At [facility], you and your health needs are our priority.  As part of our continuing mission to  provide you with exceptional heart care, we have created designated Provider Care Teams.  These Care Teams include your primary Cardiologist (physician) and Advanced Practice Providers (APPs -  Physician Assistants and Nurse Practitioners) who all work together to provide you with the care you need, when you need it.  We recommend signing up for the patient portal called "MyChart".  Sign up information is provided on this After Visit Summary.  MyChart is used to connect with patients for Virtual Visits (Telemedicine).  Patients are able to view lab/test results, encounter notes, upcoming appointments, etc.  Non-urgent messages can be sent to your provider as well.   To learn more about what you can do with MyChart, go to [email or web address].    Your next appointment:   3 month(s)  Provider:   Donato Heinz, MD     Other Instructions Please check your blood pressure at home daily, write it down.  Call the office or send message via Mychart with the readings in 2 weeks for Dr. Gardiner Rhyme to review.   Recommend Omron upper arm  blood pressure cuff    Signed, Donato Heinz, MD  08/08/2022 8:32 AM     [facility]

## 2022-08-08 ENCOUNTER — Encounter: Payer: Self-pay | Admitting: Cardiology

## 2022-08-08 ENCOUNTER — Ambulatory Visit: Payer: Medicare HMO | Attending: Cardiology | Admitting: Cardiology

## 2022-08-08 VITALS — BP 120/72 | HR 94 | Ht 69.0 in | Wt 247.8 lb

## 2022-08-08 DIAGNOSIS — I422 Other hypertrophic cardiomyopathy: Secondary | ICD-10-CM | POA: Diagnosis not present

## 2022-08-08 DIAGNOSIS — I10 Essential (primary) hypertension: Secondary | ICD-10-CM | POA: Diagnosis not present

## 2022-08-08 DIAGNOSIS — Z720 Tobacco use: Secondary | ICD-10-CM

## 2022-08-08 DIAGNOSIS — I493 Ventricular premature depolarization: Secondary | ICD-10-CM

## 2022-08-08 DIAGNOSIS — R0602 Shortness of breath: Secondary | ICD-10-CM

## 2022-08-08 DIAGNOSIS — E785 Hyperlipidemia, unspecified: Secondary | ICD-10-CM

## 2022-08-08 MED ORDER — AMLODIPINE BESYLATE 5 MG PO TABS: 5.0000 mg | ORAL_TABLET | Freq: Every day | ORAL | 3 refills | Status: DC

## 2022-08-08 NOTE — Patient Instructions (Addendum)
Medication Instructions:  DECREASE amlodipine to 5 mg daily  *If you need a refill on your cardiac medications before your next appointment, please call your pharmacy*   Lab Work: CMET, BNP, Lipid today  If you have labs (blood work) drawn today and your tests are completely normal, you will receive your results only by: [place] (if you have MyChart) OR A paper copy in the mail If you have any lab test that is abnormal or we need to change your treatment, we will call you to review the results.   Testing/Procedures: Your physician has requested that you have an echocardiogram. Echocardiography is a painless test that uses sound waves to create images of your heart. It provides your doctor with information about the size and shape of your heart and how well your heart's chambers and valves are working. This procedure takes approximately one hour. There are no restrictions for this procedure. Please do NOT wear cologne, perfume, aftershave, or lotions (deodorant is allowed). Please arrive 15 minutes prior to your appointment time.  Follow-Up: At [facility], you and your health needs are our priority.  As part of our continuing mission to provide you with exceptional heart care, we have created designated Provider Care Teams.  These Care Teams include your primary Cardiologist (physician) and Advanced Practice Providers (APPs -  Physician Assistants and Nurse Practitioners) who all work together to provide you with the care you need, when you need it.  We recommend signing up for the patient portal called "MyChart".  Sign up information is provided on this After Visit Summary.  MyChart is used to connect with patients for Virtual Visits (Telemedicine).  Patients are able to view lab/test results, encounter notes, upcoming appointments, etc.  Non-urgent messages can be sent to your provider as well.   To learn more about what you can do with MyChart, go to  [email or web address].    Your next appointment:   3 month(s)  Provider:   Donato Heinz, MD     Other Instructions Please check your blood pressure at home daily, write it down.  Call the office or send message via Mychart with the readings in 2 weeks for Dr. Gardiner Rhyme to review.   Recommend Omron upper arm blood pressure cuff

## 2022-08-09 LAB — COMPREHENSIVE METABOLIC PANEL
ALT: 27 IU/L (ref 0–44)
AST: 21 IU/L (ref 0–40)
Albumin/Globulin Ratio: 2 (ref 1.2–2.2)
Albumin: 4.6 g/dL (ref 3.9–4.9)
Alkaline Phosphatase: 70 IU/L (ref 44–121)
BUN/Creatinine Ratio: 15 (ref 10–24)
BUN: 15 mg/dL (ref 8–27)
Bilirubin Total: 0.6 mg/dL (ref 0.0–1.2)
CO2: 24 mmol/L (ref 20–29)
Calcium: 9.8 mg/dL (ref 8.6–10.2)
Chloride: 99 mmol/L (ref 96–106)
Creatinine, Ser: 0.97 mg/dL (ref 0.76–1.27)
Globulin, Total: 2.3 g/dL (ref 1.5–4.5)
Glucose: 100 mg/dL — ABNORMAL HIGH (ref 70–99)
Potassium: 4.3 mmol/L (ref 3.5–5.2)
Sodium: 141 mmol/L (ref 134–144)
Total Protein: 6.9 g/dL (ref 6.0–8.5)
eGFR: 87 mL/min/{1.73_m2} (ref >59)

## 2022-08-09 LAB — BRAIN NATRIURETIC PEPTIDE: BNP: 33.6 pg/mL (ref 0.0–100.0)

## 2022-08-09 LAB — LIPID PANEL
Chol/HDL Ratio: 2.3 ratio (ref 0.0–5.0)
Cholesterol, Total: 135 mg/dL (ref 100–199)
HDL: 59 mg/dL (ref >39)
LDL Chol Calc (NIH): 39 mg/dL (ref 0–99)
Triglycerides: 242 mg/dL — ABNORMAL HIGH (ref 0–149)
VLDL Cholesterol Cal: 37 mg/dL (ref 5–40)

## 2022-08-10 ENCOUNTER — Other Ambulatory Visit: Payer: Self-pay | Admitting: Internal Medicine

## 2022-08-10 ENCOUNTER — Other Ambulatory Visit: Payer: Self-pay

## 2022-08-10 ENCOUNTER — Other Ambulatory Visit: Payer: Self-pay | Admitting: Pulmonary Disease

## 2022-08-10 DIAGNOSIS — I152 Hypertension secondary to endocrine disorders: Secondary | ICD-10-CM

## 2022-08-10 MED ORDER — NEBIVOLOL HCL 20 MG PO TABS
20.0000 mg | ORAL_TABLET | Freq: Two times a day (BID) | ORAL | 1 refills | Status: DC
  Filled 2022-08-10: qty 60, 30d supply, fill #0
  Filled 2022-09-07: qty 60, 30d supply, fill #1
  Filled 2022-09-08 – 2022-09-09 (×2): qty 180, 90d supply, fill #1

## 2022-08-11 ENCOUNTER — Other Ambulatory Visit: Payer: Self-pay

## 2022-08-12 ENCOUNTER — Other Ambulatory Visit: Payer: Self-pay | Admitting: Pulmonary Disease

## 2022-08-12 ENCOUNTER — Other Ambulatory Visit: Payer: Self-pay

## 2022-08-15 ENCOUNTER — Other Ambulatory Visit: Payer: Self-pay | Admitting: Pulmonary Disease

## 2022-08-15 ENCOUNTER — Telehealth: Payer: Self-pay | Admitting: Pulmonary Disease

## 2022-08-15 DIAGNOSIS — J4550 Severe persistent asthma, uncomplicated: Secondary | ICD-10-CM

## 2022-08-15 MED ORDER — PREDNISONE 10 MG PO TABS: ORAL_TABLET | ORAL | 0 refills | Status: DC

## 2022-08-15 NOTE — Telephone Encounter (Signed)
[facility] for order for vest therapy

## 2022-08-15 NOTE — Telephone Encounter (Signed)
Patient would like an order for a vest. He advised this was discussed at his last visit. Dr. Silas Flood please advise

## 2022-08-15 NOTE — Telephone Encounter (Signed)
Pt calling in regards to his Prednisone, he states he was told he doesn't need a appt to have a script sent in

## 2022-08-16 ENCOUNTER — Other Ambulatory Visit: Payer: Self-pay

## 2022-08-16 ENCOUNTER — Telehealth: Payer: Self-pay | Admitting: Pulmonary Disease

## 2022-08-16 NOTE — Telephone Encounter (Signed)
Melissa from [employer] needs Dr. Silas Flood to Copy and paste paragraph from referral into his most recent OV notes.

## 2022-08-16 NOTE — Telephone Encounter (Signed)
Pt cal;ling in bc he wants to see about how getting free breztri

## 2022-08-17 ENCOUNTER — Telehealth: Payer: Self-pay | Admitting: Pulmonary Disease

## 2022-08-17 NOTE — Telephone Encounter (Signed)
Melissa from [employer] calling again about Dr. Kavin Leech report in Epic that she requested a "cut and paste" on.   She states that Dr. has on the referral, not in the notes, Bronchiectasis but this is not on the CT report under the imaging findings. Their hands are tied because the insurance company wants to see it on the CT notes in order provide coverage.  (Also, Not sure why Curlene Labrum has an attached note from PT in this encounter below. I will start a new encounter so disregard the req here for free Breztri.)

## 2022-08-17 NOTE — Telephone Encounter (Signed)
I called Joseph Harvey from [employer] back but had to leave a message. I am awaiting her call back.

## 2022-08-17 NOTE — Telephone Encounter (Signed)
PT would like to see if we have any Breztri samples we can leave up front or him.  Pls call to advise @ [phone]

## 2022-08-19 ENCOUNTER — Other Ambulatory Visit: Payer: Self-pay

## 2022-08-22 ENCOUNTER — Telehealth: Payer: Self-pay | Admitting: Internal Medicine

## 2022-08-22 ENCOUNTER — Other Ambulatory Visit: Payer: Self-pay

## 2022-08-22 NOTE — Telephone Encounter (Signed)
I called and spoke with Melissa with [employer] regarding referral for VEST therapy  She states that due to pt not having dx of Bronchiectasis, this will unfortunately not be covered It was mentioned in referral notes, that the recent chest ct had bronchiectasis  This has to be in the report, not listed as just "bronchial wall thickening"  She called radiology and asked that the ct be addended  This is per the addendum from 08/16/22:  ADDENDUM REPORT: 08/16/2022 14:06   ADDENDUM: Mild bronchitis with no evidence of bronchiectasis.     Electronically Signed   By: Allegra Lai M.D.   On: 08/16/2022 14:06  Routing to Dr Judeth Horn to make him aware of this.

## 2022-08-22 NOTE — Telephone Encounter (Signed)
Contacted Joseph Harvey to schedule their annual wellness visit. Welcome to Medicare visit Due by 09/14/22.  [name] AWV direct phone # [phone]   WTM before 09/14/22 per [facility]

## 2022-08-22 NOTE — Telephone Encounter (Signed)
Ok - sounds like nothing we can do about it since insurance is refusing to cover it.

## 2022-08-23 NOTE — Telephone Encounter (Signed)
Rerouting due to no reply. TY.

## 2022-08-24 NOTE — Telephone Encounter (Signed)
Called the pt and there was no answer- LMTCB    

## 2022-08-24 NOTE — Telephone Encounter (Signed)
Spoke with pt who stated understanding of denial of vest therapy. Pt is requesting refill of prednisone stating he uses it when the pollen makes him cough and wheezing. Dr. Judeth Horn please advise.

## 2022-08-25 DIAGNOSIS — G4733 Obstructive sleep apnea (adult) (pediatric): Secondary | ICD-10-CM | POA: Diagnosis not present

## 2022-08-25 NOTE — Telephone Encounter (Signed)
OK for prednisone 20 mg tablets, 1 tablet daily, quantity 10, refills 0

## 2022-08-25 NOTE — Telephone Encounter (Signed)
Called and spoke to patient and he is asking if with his prednisone if MH is ok with having refills on the 20mg  of prednisone. He states he is tried of calling every month to ask for refills. I told patient I would have to get approval from [facility].  MH please advise   -A

## 2022-08-25 NOTE — Telephone Encounter (Signed)
Okay with 1 refill thanks

## 2022-08-26 ENCOUNTER — Other Ambulatory Visit: Payer: Self-pay

## 2022-08-26 MED ORDER — PREDNISONE 10 MG PO TABS: 20.0000 mg | ORAL_TABLET | Freq: Every day | ORAL | 1 refills | Status: DC

## 2022-08-26 NOTE — Telephone Encounter (Signed)
Sent in medication as approved from [facility]  Nothing further needed

## 2022-08-26 NOTE — Addendum Note (Signed)
Addended by: Lavetta Nielsen L on: 08/26/2022 02:11 PM   Modules accepted: Orders

## 2022-08-29 ENCOUNTER — Other Ambulatory Visit: Payer: Self-pay

## 2022-09-01 ENCOUNTER — Other Ambulatory Visit: Payer: Self-pay | Admitting: Internal Medicine

## 2022-09-01 ENCOUNTER — Other Ambulatory Visit: Payer: Self-pay

## 2022-09-01 DIAGNOSIS — M5416 Radiculopathy, lumbar region: Secondary | ICD-10-CM

## 2022-09-01 MED ORDER — IPRATROPIUM BROMIDE 0.06 % NA SOLN
2.0000 | Freq: Three times a day (TID) | NASAL | 0 refills | Status: DC
  Filled 2022-09-01: qty 15, 25d supply, fill #0

## 2022-09-01 NOTE — Telephone Encounter (Signed)
Pt assistance was started for pt for the [facility] and pt received notification that he was approved for patient assistance for the [place]. Closing encounter.

## 2022-09-05 ENCOUNTER — Ambulatory Visit (HOSPITAL_COMMUNITY): Payer: Medicare HMO | Attending: Internal Medicine

## 2022-09-05 DIAGNOSIS — R0602 Shortness of breath: Secondary | ICD-10-CM | POA: Diagnosis not present

## 2022-09-05 DIAGNOSIS — I422 Other hypertrophic cardiomyopathy: Secondary | ICD-10-CM | POA: Insufficient documentation

## 2022-09-05 LAB — ECHOCARDIOGRAM COMPLETE
Area-P 1/2: 5.2 cm2
Est EF: >75
S' Lateral: 1.8 cm

## 2022-09-05 MED ORDER — PERFLUTREN LIPID MICROSPHERE
1.0000 mL | INTRAVENOUS | Status: AC | PRN
Start: 2022-09-05 — End: 2022-09-05
  Administered 2022-09-05: 2 mL via INTRAVENOUS

## 2022-09-08 ENCOUNTER — Telehealth: Payer: Self-pay

## 2022-09-08 ENCOUNTER — Other Ambulatory Visit: Payer: Self-pay

## 2022-09-08 DIAGNOSIS — G4733 Obstructive sleep apnea (adult) (pediatric): Secondary | ICD-10-CM | POA: Diagnosis not present

## 2022-09-08 NOTE — Telephone Encounter (Signed)
Copied from CRM [id number]. Topic: General - Other >> Sep 08, 2022  8:57 AM Joseph Harvey wrote: Patient would like to be his Trulicity to be changed to Rybelsus. Patient states the he can be on a discount program with [employer] and it will be more affordable. Please advise. [facility]  Phone: [phone] Fax: [phone]

## 2022-09-09 ENCOUNTER — Other Ambulatory Visit: Payer: Self-pay

## 2022-09-09 NOTE — Telephone Encounter (Signed)
Noted in chart that Dr. Bjorn Pippin with Cardiology prescribed trulicity. He asked for this to help him with weight management.    Per patient, he states Mardee Postin in pharmacy, stated that the Rybelsus will help him stay within the cost and not go over his doughnut hole for his Rx.   He state that he has not taken the injections but would like to do this so he can be headed in a better direction before his next appt.    Please advise.

## 2022-09-12 ENCOUNTER — Other Ambulatory Visit: Payer: Self-pay | Admitting: *Deleted

## 2022-09-12 ENCOUNTER — Other Ambulatory Visit: Payer: Self-pay

## 2022-09-12 MED ORDER — HYDROCHLOROTHIAZIDE 25 MG PO TABS: 12.5000 mg | ORAL_TABLET | Freq: Every day | ORAL | 1 refills | Status: DC

## 2022-09-12 NOTE — Telephone Encounter (Signed)
He states he left off at Dulaglutide 3 mg weekly. Last dose was in June, 2023.   Pls advise

## 2022-09-14 ENCOUNTER — Telehealth: Payer: Self-pay | Admitting: Internal Medicine

## 2022-09-14 ENCOUNTER — Other Ambulatory Visit: Payer: Self-pay

## 2022-09-14 NOTE — Telephone Encounter (Signed)
Spoke with patient . Verified name & DOB    Patient voiced that he wanted to talk about starting Dulaglutide  Advised the PCP out of he office. Patient offered Mychart VV with PCP for Monday as there are no availability in office appointments until late may early June. Patient voiced that he would contact his cardiologist  to see if he would give guidance about restart Dulaglutide as the cardiologist prescribed it before.

## 2022-09-14 NOTE — Telephone Encounter (Signed)
Pt is calling in requesting to speak with Dr. Henriette Combs Nurse. Pleas advise.

## 2022-09-15 ENCOUNTER — Other Ambulatory Visit: Payer: Self-pay

## 2022-09-19 ENCOUNTER — Telehealth: Payer: Self-pay | Admitting: Physician Assistant

## 2022-09-19 NOTE — Telephone Encounter (Signed)
Good Morning , this patient called in wanting to schedule a Colonoscopy , pt states he had one 3 years ago and was told to repeat it in 3 years . However I do not see a recall letter for this patient , I am would like to know if its okay for me to schedule procedure or if he is not due just yet to get another one.Thank you

## 2022-09-19 NOTE — Telephone Encounter (Signed)
Reviewed the procedure report and the pathology report. The patient is due a repeat colonoscopy June 2024.  It is okay to schedule the patient for a direct colonoscopy in the [facility]. Thank you.

## 2022-09-20 ENCOUNTER — Other Ambulatory Visit: Payer: Self-pay

## 2022-09-20 DIAGNOSIS — H5213 Myopia, bilateral: Secondary | ICD-10-CM | POA: Diagnosis not present

## 2022-09-20 DIAGNOSIS — H52223 Regular astigmatism, bilateral: Secondary | ICD-10-CM | POA: Diagnosis not present

## 2022-09-21 ENCOUNTER — Telehealth: Payer: Self-pay | Admitting: Cardiology

## 2022-09-21 NOTE — Telephone Encounter (Signed)
Spoke with patient of Dr. Bjorn Pippin -- he reports bilateral LE edema on amlodipine. He was on 10mg  and decreased to 5mg  and swelling improved but now resumed.   Recent BP readings:  119/86 127/96 135/81 127/77   Per last MD note:  Hypertension: On amlodipine 10 mg daily, HCTZ 25 mg daily, valsartan 320 mg, Bystolic 20 mg daily twice daily.  Appears controlled.  Decrease amlodipine to 5 mg daily given lower extremity edema as above.  Asked to check BP daily for next 2 weeks and let us know results

## 2022-09-21 NOTE — Telephone Encounter (Signed)
Pt c/o medication issue:  1. Name of Medication: amLODipine (NORVASC) 5 MG tablet   2. How are you currently taking this medication (dosage and times per day)? As prescribed.   3. Are you having a reaction (difficulty breathing--STAT)? Yes  4. What is your medication issue? Swelling. Patient would like to know if he can stop this medication. Please advise.

## 2022-09-22 ENCOUNTER — Encounter: Payer: Self-pay | Admitting: Cardiology

## 2022-09-22 NOTE — Telephone Encounter (Signed)
Follow Up:     Patient says he would like to talk to [facility]. Patient says he is still swelling.

## 2022-09-22 NOTE — Telephone Encounter (Signed)
Would continue amlodipine for now, continue to monitor BP daily and bring log at upcoming appointment

## 2022-09-23 ENCOUNTER — Emergency Department (HOSPITAL_BASED_OUTPATIENT_CLINIC_OR_DEPARTMENT_OTHER): Payer: Medicare HMO

## 2022-09-23 ENCOUNTER — Encounter (HOSPITAL_BASED_OUTPATIENT_CLINIC_OR_DEPARTMENT_OTHER): Payer: Self-pay

## 2022-09-23 ENCOUNTER — Emergency Department (HOSPITAL_BASED_OUTPATIENT_CLINIC_OR_DEPARTMENT_OTHER)
Admission: EM | Admit: 2022-09-23 | Discharge: 2022-09-23 | Disposition: A | Discharge status: Home / Self Care | Payer: Medicare HMO | Attending: Emergency Medicine | Admitting: Emergency Medicine

## 2022-09-23 ENCOUNTER — Telehealth: Payer: Self-pay | Admitting: Pulmonary Disease

## 2022-09-23 ENCOUNTER — Other Ambulatory Visit: Payer: Self-pay

## 2022-09-23 DIAGNOSIS — J441 Chronic obstructive pulmonary disease with (acute) exacerbation: Secondary | ICD-10-CM | POA: Diagnosis not present

## 2022-09-23 DIAGNOSIS — Z7982 Long term (current) use of aspirin: Secondary | ICD-10-CM | POA: Diagnosis not present

## 2022-09-23 DIAGNOSIS — R0602 Shortness of breath: Secondary | ICD-10-CM | POA: Diagnosis not present

## 2022-09-23 DIAGNOSIS — R600 Localized edema: Secondary | ICD-10-CM | POA: Diagnosis not present

## 2022-09-23 LAB — BASIC METABOLIC PANEL
Anion gap: 11 (ref 5–15)
BUN: 13 mg/dL (ref 8–23)
CO2: 24 mmol/L (ref 22–32)
Calcium: 9 mg/dL (ref 8.9–10.3)
Chloride: 102 mmol/L (ref 98–111)
Creatinine, Ser: 1.17 mg/dL (ref 0.61–1.24)
GFR, Estimated: >60 mL/min (ref >60)
Glucose, Bld: 149 mg/dL — ABNORMAL HIGH (ref 70–99)
Potassium: 4.2 mmol/L (ref 3.5–5.1)
Sodium: 137 mmol/L (ref 135–145)

## 2022-09-23 LAB — CBC
HCT: 40.6 % (ref 39.0–52.0)
Hemoglobin: 13.7 g/dL (ref 13.0–17.0)
MCH: 31.8 pg (ref 26.0–34.0)
MCHC: 33.7 g/dL (ref 30.0–36.0)
MCV: 94.2 fL (ref 80.0–100.0)
Platelets: 302 10*3/uL (ref 150–400)
RBC: 4.31 MIL/uL (ref 4.22–5.81)
RDW: 12.6 % (ref 11.5–15.5)
WBC: 9.8 10*3/uL (ref 4.0–10.5)
nRBC: 0 % (ref 0.0–0.2)

## 2022-09-23 LAB — TROPONIN I (HIGH SENSITIVITY): Troponin I (High Sensitivity): 8 ng/L (ref <18)

## 2022-09-23 LAB — BRAIN NATRIURETIC PEPTIDE: B Natriuretic Peptide: 85.9 pg/mL (ref 0.0–100.0)

## 2022-09-23 MED ORDER — FUROSEMIDE 40 MG PO TABS: 40.0000 mg | ORAL_TABLET | Freq: Every day | ORAL | 0 refills | Status: DC

## 2022-09-23 MED ORDER — PREDNISONE 20 MG PO TABS: ORAL_TABLET | ORAL | 0 refills | Status: DC

## 2022-09-23 MED ORDER — FUROSEMIDE 10 MG/ML IJ SOLN
40.0000 mg | Freq: Once | INTRAMUSCULAR | Status: AC
  Administered 2022-09-23: 40 mg via INTRAVENOUS
  Filled 2022-09-23: qty 4

## 2022-09-23 MED ORDER — IPRATROPIUM-ALBUTEROL 0.5-2.5 (3) MG/3ML IN SOLN
3.0000 mL | Freq: Once | RESPIRATORY_TRACT | Status: AC
  Administered 2022-09-23: 3 mL via RESPIRATORY_TRACT
  Filled 2022-09-23: qty 3

## 2022-09-23 NOTE — Discharge Instructions (Signed)
1.  Take Lasix daily for the next 3 days as prescribed.  Follow-up with your cardiologist as soon as possible for recheck.  See your family doctor for monitoring your labs and your response to Lasix. 2.  Part of your shortness of breath also appears to be due to your COPD.  Take prednisone for the next several days and try to avoid any triggers such as smoke or allergens. 3.  Return to the emergency department if you get worsening shortness of breath, fevers or other concerning changes.

## 2022-09-23 NOTE — ED Notes (Signed)
   09/23/22 1043  Aerosol Therapy Tx  $ Hand Held Nebulizer  1  Medications Duoneb  Solution Normal saline  Delivery Source Air  Delivery Device HHN  Pre-Treatment Pulse 86  Pre-Treatment Respirations 16  Post-Treatment Pulse 87  Post-Treatment Respirations 16  Treatment Tolerance Tolerated well  Treatment Given 1  RT Breath Sounds  Bilateral Breath Sounds Diminished;Inspiratory wheezes  Oxygen Therapy/Pulse Ox  O2 Device Room [employer]

## 2022-09-23 NOTE — ED Provider Notes (Signed)
[place] [facility] Provider Note   CSN: [id number] Arrival date & time: 09/23/22  4098     History {Add pertinent medical, surgical, social history, OB history to HPI:1} Chief Complaint  Patient presents with   Shortness of Breath    Joseph Harvey is a 67 y.o. male.  HPI     Home Medications Prior to Admission medications   Medication Sig Start Date End Date Taking? Authorizing Provider  acetaminophen (TYLENOL) 325 MG tablet Take 2 tablets (650 mg total) by mouth every 4 (four) hours as needed for headache or mild pain. 08/11/21   Graciella Freer, PA-C  albuterol (PROVENTIL) (2.5 MG/3ML) 0.083% nebulizer solution TAKE 3 MLS BY NEBULIZATION EVERY 4 HOURS AS NEEDED FOR WHEEZING OR SHORTNESS OF BREATH (((PLAN B))) 05/10/22   Hunsucker, Lesia Sago, MD  albuterol (VENTOLIN HFA) 108 (90 Base) MCG/ACT inhaler Inhale 2 puffs into the lungs every 4 hours as needed for shortness of breath or wheezing 01/11/22   Hunsucker, Lesia Sago, MD  amLODipine (NORVASC) 5 MG tablet Take 1 tablet (5 mg total) by mouth daily. 08/08/22   [name], MD  ASPIRIN 81 PO Take 81 mg by mouth daily.    [provider]  Blood Glucose Monitoring Suppl (TRUE METRIX METER) w/Device KIT Use as directed 01/07/19   Marcine Matar, MD  busPIRone (BUSPAR) 10 MG tablet Take 1 tablet (10 mg total) by mouth 3 (three) times daily. Patient taking differently: Take 10 mg by mouth 3 (three) times daily as needed (anxiety). 09/11/20   Marcine Matar, MD  Cholecalciferol (VITAMIN D PO) Take 1 tablet by mouth daily.    [provider]  diazepam (VALIUM) 10 MG tablet Take 1 tablet (10 mg total) by mouth 60 (sixty) minutes before procedure for 1 dose. Take with a sip of water, on an empty stomach. 03/01/22   [name], MD  ezetimibe (ZETIA) 10 MG tablet Take 1 tablet (10 mg total) by mouth daily. 11/09/21 11/04/22  [name], MD   Fluticasone-Umeclidin-Vilant (TRELEGY ELLIPTA) 200-62.5-25 MCG/ACT AEPB Inhale 1 puff into the lungs daily. 05/18/22   Hunsucker, Lesia Sago, MD  gabapentin (NEURONTIN) 300 MG capsule take 1 capsule by mouth in the morning, then take 1 at lunch and 2 at bedtime Patient taking differently: Take 300 mg by mouth 3 (three) times daily as needed (Nerve pain). 12/29/20     glucose blood (TRUE METRIX BLOOD GLUCOSE TEST) test strip Use as instructed 01/07/19   Marcine Matar, MD  hydrochlorothiazide (HYDRODIURIL) 25 MG tablet Take 0.5 tablets (12.5 mg total) by mouth daily. 09/12/22   [name], MD  ipratropium (ATROVENT) 0.06 % nasal spray Place 2 sprays into both nostrils 3 (three) times daily. As needed for nasal congestion, runny nose 09/01/22   Marcine Matar, MD  ipratropium-albuterol (DUONEB) 0.5-2.5 (3) MG/3ML SOLN INHALE 1 VIAL VIA NEBULIZER TWICE A DAY 01/13/22   Marcine Matar, MD  lidocaine (LIDODERM) 5 % Apply 1 patch to affected area daily and leave on for 12 hours at a time.  Remove after 12 hours 01/05/22   Marcine Matar, MD  linaclotide [facility]) 72 MCG capsule Take 1 capsule (72 mcg total) by mouth daily before breakfast. 06/22/22   Marcine Matar, MD  metFORMIN (GLUCOPHAGE-XR) 500 MG 24 hr tablet Take 2 tablets (1000 mg total) by mouth in the morning and 1 tablet (500 mg total) by mouth in the evening  with meals. 07/05/22   Marcine Matar, MD  montelukast (SINGULAIR) 10 MG tablet Take 1 tablet (10 mg total) by mouth at bedtime. 01/11/22   Marcine Matar, MD  Nebivolol HCl 20 MG TABS Take 1 tablet (20 mg total) by mouth 2 (two) times daily. 08/10/22   Marcine Matar, MD  pantoprazole (PROTONIX) 40 MG tablet Take 1 tablet (40 mg total) by mouth daily as needed. 07/12/21   Marcine Matar, MD  potassium chloride SA (KLOR-CON M) 20 MEQ tablet Take 1 tablet (20 mEq total) by mouth daily as needed (When hands cramp up). 12/26/21   Storm Frisk, MD   predniSONE (DELTASONE) 10 MG tablet TAKE 1-3 TABLETS BY MOUTH AS NEEDED FOR CHEST TIGHTNESS OR WHEEZE OR WORSENING SHORTNESS OF BREATH 08/15/22   Hunsucker, Lesia Sago, MD  predniSONE (DELTASONE) 10 MG tablet Take 2 tablets (20 mg total) by mouth daily with breakfast. 08/26/22   Hunsucker, Lesia Sago, MD  Respiratory Therapy Supplies (FLUTTER) DEVI Use three times a day after inhaler or nebulizer use 12/10/18   Storm Frisk, MD  rosuvastatin (CRESTOR) 20 MG tablet Take 1 tablet (20 mg total) by mouth daily. 02/02/22   [name], MD  silver sulfADIAZINE (SILVADENE) 1 % cream Apply twice a day to affected area 12/17/21   Marcine Matar, MD  traMADol (ULTRAM) 50 MG tablet Take 1 tablet (50 mg total) by mouth every evening. Each prescription to last 1 mth 07/04/22   Marcine Matar, MD  TRUEplus Lancets 28G MISC Use as directed 01/07/19   Marcine Matar, MD  valsartan (DIOVAN) 320 MG tablet Take 1 tablet (320 mg total) by mouth daily. 09/06/21 10/07/22  [name], MD      Allergies    Amoxicillin, Lisinopril, Molds & smuts, Robitussin [guaifenesin], and Azithromycin    Review of Systems   Review of Systems  Physical Exam Updated Vital Signs BP 136/87   Pulse 88   Temp 98.6 F (37 C) (Oral)   Resp (!) 24   Ht 5\' 9"  (1.753 m)   Wt 117.9 kg   SpO2 97%   BMI 38.40 kg/m  Physical Exam  ED Results / Procedures / Treatments   Labs (all labs ordered are listed, but only abnormal results are displayed) Labs Reviewed  BASIC METABOLIC PANEL - Abnormal; Notable for the following components:      Result Value   Glucose, Bld 149 (*)    All other components within normal limits  CBC    EKG None  Radiology DG Chest 2 View  Result Date: 09/23/2022 CLINICAL DATA:  Shortness of breath EXAM: CHEST - 2 VIEW COMPARISON:  09/24/2021 x-ray and older.  CT 12/15/2021 and older FINDINGS: No consolidation, pneumothorax or effusion. No edema. Normal cardiopericardial  silhouette. Tiny nodule in the right midlung overlying the interspace between the posterior aspect of the right seventh and eighth ribs. IMPRESSION: Possible small right-sided lung nodule. Not clearly seen on the prior x-ray. Recommend follow up CT when appropriate to confirm etiology when clinically appropriate Electronically Signed   By: Karen Kays M.D.   On: 09/23/2022 10:36    Procedures Procedures  {Document cardiac monitor, telemetry assessment procedure when appropriate:1}  Medications Ordered in ED Medications  ipratropium-albuterol (DUONEB) 0.5-2.5 (3) MG/3ML nebulizer solution 3 mL (3 mLs Nebulization Given 09/23/22 1043)    ED Course/ Medical Decision Making/ A&P   {   Click here for ABCD2, HEART and other  calculatorsREFRESH Note before signing :1}                          Medical Decision Making Amount and/or Complexity of Data Reviewed Labs: ordered. Radiology: ordered.  Risk Prescription drug management.   ***  {Document critical care time when appropriate:1} {Document review of labs and clinical decision tools ie heart score, Chads2Vasc2 etc:1}  {Document your independent review of radiology images, and any outside records:1} {Document your discussion with family members, caretakers, and with consultants:1} {Document social determinants of health affecting pt's care:1} {Document your decision making why or why not admission, treatments were needed:1} Final Clinical Impression(s) / ED Diagnoses Final diagnoses:  None    Rx / DC Orders ED Discharge Orders     None

## 2022-09-23 NOTE — ED Triage Notes (Signed)
Patient having increased shortness of breath, leg swelling. He stated over the last two days he had some chest pain that has resolved. He is also complaining of constipation.

## 2022-09-23 NOTE — Telephone Encounter (Signed)
See my chart message

## 2022-09-26 ENCOUNTER — Other Ambulatory Visit: Payer: Self-pay | Admitting: Pulmonary Disease

## 2022-09-27 MED ORDER — FUROSEMIDE 40 MG PO TABS: 40.0000 mg | ORAL_TABLET | Freq: Every day | ORAL | 0 refills | Status: DC | PRN

## 2022-09-28 ENCOUNTER — Other Ambulatory Visit: Payer: Self-pay | Admitting: Internal Medicine

## 2022-09-28 ENCOUNTER — Telehealth: Payer: Self-pay | Admitting: Internal Medicine

## 2022-09-28 DIAGNOSIS — Z7189 Other specified counseling: Secondary | ICD-10-CM

## 2022-09-28 DIAGNOSIS — M5416 Radiculopathy, lumbar region: Secondary | ICD-10-CM

## 2022-09-28 MED ORDER — TRAMADOL HCL 50 MG PO TABS: 50.0000 mg | ORAL_TABLET | Freq: Every evening | ORAL | 2 refills | Status: AC

## 2022-09-28 MED ORDER — PREDNISONE 10 MG PO TABS: ORAL_TABLET | ORAL | 0 refills | Status: AC

## 2022-09-28 NOTE — Telephone Encounter (Signed)
Dr. Judeth Horn, please advise if you are ok with a refill of his prednisone. It looks like the last RX was sent in on 4/12 for 20mg  once daily with 20 tablets.

## 2022-09-28 NOTE — Telephone Encounter (Signed)
Pt has been wanting a refill for Prednisone 10 mg,  He put in a MyChart message from May 10 th and has not heard nothing back since.

## 2022-09-28 NOTE — Telephone Encounter (Signed)
ATC X1 LVM for patient to call the office back. Please advise prednisone has been sent. Please schedule f//u with Dr. Judeth Horn or App in 2weeks

## 2022-09-28 NOTE — Telephone Encounter (Signed)
Pt scheduled for appt 5/21.

## 2022-09-28 NOTE — Telephone Encounter (Signed)
Prednisone taper sent, can we call and see how he is doing, may be schedule appointment with myself or an APP in the next week or 2 given this is the second or third course of prednisone in the last couple months?

## 2022-09-28 NOTE — Telephone Encounter (Signed)
Copied from CRM [id number]. Topic: Appointment Scheduling - Scheduling Inquiry for Clinic >> Sep 27, 2022  1:46 PM Dondra Prader E wrote: Reason for CRM: Pt called reporting that he needs an appt for a urine test to refill his Tramadol. Does he need to see his PCP or can he have a nurse visit? He wants to come tomorrow morning early.

## 2022-09-28 NOTE — Telephone Encounter (Signed)
Patient came to [facility] in-person. Joseph Harvey was informed that no UDS is needed and a refill of tramadol was sent to the pharmacy by Elsie Lincoln, RN. No further questions at this time.

## 2022-09-28 NOTE — Addendum Note (Signed)
Addended by: Jonah Blue B on: 09/28/2022 09:51 AM   Modules accepted: Orders

## 2022-10-02 NOTE — Progress Notes (Unsigned)
Cardiology Clinic Note   Date: 10/03/2022 ID: Joseph Harvey, DOB 04/27/1956, MRN [id number]  Primary Cardiologist:  [name], MD  Patient Profile    Joseph Harvey is a 67 y.o. male who presents to the clinic today for evaluation of lower extremity edema.   Past medical history significant for: Nonobstructive CAD. CT cardiac scoring 04/29/2020: Coronary calcium score 117 (81st percentile). Nuclear stress test 04/29/2020: Normal, low risk study. Hypertrophic cardiomyopathy. Cardiac MRI 06/22/2020: Asymmetric hypertrophy 20 mm in basal anteroseptum.  Patchy LGE in the basal septum and RV insertion sites.  LGE accounts for 11% of total myocardial mass.  Normal LV/RV size and systolic function.  Findings consistent with hypertrophic cardiomyopathy. Echo 09/05/2022: EF >75%.  Vigorous LV function with near mid cavity obliteration during systole.  With Valsalva, gradient increased to 32 mmHg.  Moderate asymmetric LVH.  Grade I DD.  Normal RV function.  Trivial MR.  LV function unchanged from prior echo 04/14/2021. 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 3-day ZIO 10/07/2021: Occasional PVCs (1.6%).  Previous PVC burden 14.3% for 3-day ZIO 04/06/2021 Hypertension. Hyperlipidemia. Lipid panel 08/08/2022: LDL 39, HDL 59, TG 242, total 135. Asthma. COPD. GERD. T2DM. Tobacco abuse.   History of Present Illness    Joseph Harvey was first evaluated by Dr. Bjorn Pippin on 04/14/2020 for exertional shortness of breath at the request of Dr. Laural Benes.  Echo performed prior to visit showed severe septal hypertrophy with otherwise mild concentric hypertrophy, mild intracavity gradient peak 30 mmHg, normal BiV function, Grade I DD.  EKG showed frequent PVCs.  Patient underwent cardiac testing.  3-day ZIO showed occasional PVCs.  Cardiac MRI showed hypertrophic cardiomyopathy.  Patient had normal nuclear stress test.  He continues to be followed by Dr. Bjorn Pippin for the above outlined history.  Patient was  last seen in the office by Dr. Bjorn Pippin on 08/08/2022 with complaints of DOE and bilateral foot and ankle edema.  Expiratory wheeze heard on exam, suspected DOE related to asthma.  Amlodipine was decreased secondary to ankle edema.  Repeat echo showed increased LVOT gradient suspected to be related to diuretic and HCTZ was decreased.  Patient corresponded with the office 09/21/2022 to 09/27/2022 with reports of well-controlled BP but continued lower extremity edema.  At that time.  He presented to the ED on 09/23/2022 with complaints of increased shortness of breath and lower extremity edema.  Troponin negative x 1.  BNP negative.  Patient was given a short course of prednisone for wheezing and COPD.  He was instructed to take a short course of Lasix for lower extremity edema and follow-up with cardiology as an outpatient.  Today, patient reports lower extremity edema has improved since starting Lasix. He just got a new home scale but has not been weighing daily. He reports brisk diuresis with Lasix. He reports increased shortness of breath when his fluid volume is up. He does not elevate his legs throughout the day. He is taking amlodipine 5 mg bid because his BP was elevated when he was just taking 5 mg. He is typically inactive with occasional use of a stationary bike. 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 He reports some chest pressure when he is holding on to extra fluid but otherwise no chest pain. He admits to dietary indiscretion consuming hot dogs, canned soup, processed meats, and bread. Discussed the importance of salt restriction. He reports "I am not going to lie, I plan on having an [place] sub tonight for dinner before I kick this off. Then I  won't have it for a while."    ROS: All other systems reviewed and are otherwise negative except as noted in History of Present Illness.  Studies Reviewed    ECG is not ordered today.          Physical Exam    VS:  BP 132/84   Pulse 85   Ht 5\' 9"  (1.753  m)   Wt 250 lb (113.4 kg)   SpO2 94%   BMI 36.92 kg/m  , BMI Body mass index is 36.92 kg/m.  GEN: Well nourished, well developed, in no acute distress. Neck: No JVD or carotid bruits. Cardiac:  RRR. No murmurs. No rubs or gallops.   Respiratory:  Respirations regular and unlabored. Clear to auscultation without rales, wheezing or rhonchi. GI: Soft, nontender, nondistended. Extremities: Radials/DP/PT 2+ and equal bilaterally. No clubbing or cyanosis. 2+ pitting edema L>R lower extremities.   Skin: Warm and dry, no rash. Neuro: Strength intact.  Assessment & Plan    Hypertrophic cardiomyopathy/lower extremity edema.  Recent ED visit 09/23/2022 with complaints of increased shortness of breath and lower extremity edema.  Treated with short course of steroids for COPD and course of Lasix for edema. Daily Lasix was added by Dr. Bjorn Pippin.  Patient reports brisk diuresis and improved edema on daily Lasix. He reports dietary indiscretion with the majority of his meals. He has 2+ pitting edema on exam today. Diminished breath sounds bilaterally without wheezing, rhonchi or rales. Will check BMP today. Increase Lasix to 60 mg x 3 days then 40 mg daily thereafter. BMP in 1 week. Weight daily. Take an extra dose of Lasix with weight gain of 3 lb overnight or 5 lb in a week. Continue valsartan, nebivolol, hydrochlorothiazide. Nonobstructive CAD.  Coronary calcium score 117 December 2021.  Normal, low risk nuclear stress test December 2021.  Patient reports mild chest pain when he is holding extra fluid "I think it is my lungs." Otherwise he denies chest pain.  Continue aspirin, rosuvastatin, ezetimibe, amlodipine, nebivolol. Hypertension. BP today 132/84. Patient denies headaches, dizziness or vision changes. Continue valsartan, nebivolol, hydrochlorothiazide, amlodipine. Hyperlipidemia.  LDL March 2024 39, at goal.  Continue rosuvastatin and ezetimibe. Tobacco abuse. Patient continues to smoke 1/2 pack per  day. Discussed strategies to cut back.   Disposition: BMP today. Increase Lasix to 60 mg x 3 days then 40 mg daily thereafter. Repeat BMP in 1 week. Weigh daily. Take an extra dose of Lasix if weight gain of 3 lb overnight or 5 lb in a week. Salty 6 information sheet provided. Return for previously scheduled visit with Dr Bjorn Pippin or sooner as needed.          Signed, Etta Grandchild. Lenward Able, DNP, NP-C

## 2022-10-03 ENCOUNTER — Encounter: Payer: Self-pay | Admitting: Student

## 2022-10-03 ENCOUNTER — Other Ambulatory Visit: Payer: Self-pay

## 2022-10-03 ENCOUNTER — Ambulatory Visit: Payer: Medicare HMO | Attending: Student | Admitting: Student

## 2022-10-03 VITALS — BP 132/84 | HR 85 | Ht 69.0 in | Wt 250.0 lb

## 2022-10-03 DIAGNOSIS — I422 Other hypertrophic cardiomyopathy: Secondary | ICD-10-CM | POA: Diagnosis not present

## 2022-10-03 DIAGNOSIS — E785 Hyperlipidemia, unspecified: Secondary | ICD-10-CM

## 2022-10-03 DIAGNOSIS — Z720 Tobacco use: Secondary | ICD-10-CM

## 2022-10-03 DIAGNOSIS — Z79899 Other long term (current) drug therapy: Secondary | ICD-10-CM

## 2022-10-03 DIAGNOSIS — I2510 Atherosclerotic heart disease of native coronary artery without angina pectoris: Secondary | ICD-10-CM

## 2022-10-03 NOTE — Patient Instructions (Signed)
Medication Instructions:  Your physician has recommended you make the following change in your medication:  INCREASE: Lasix 60mg  daily then resume taking 40mg  daily.  *If you need a refill on your cardiac medications before your next appointment, please call your pharmacy*  Please weigh daily. If you gain 3lbs over night or 5lbs in a week please take an extra 40mg  of lasix.    Lab Work: Your physician recommends that you have the following lab drawn today: BMET  Your physician recommends that you return in 1 week to have the following lab drawn: BMET If you have labs (blood work) drawn today and your tests are completely normal, you will receive your results only by: MyChart Message (if you have MyChart) OR A paper copy in the mail If you have any lab test that is abnormal or we need to change your treatment, we will call you to review the results.   Testing/Procedures: NONE   Follow-Up: At [facility], you and your health needs are our priority.  As part of our continuing mission to provide you with exceptional heart care, we have created designated Provider Care Teams.  These Care Teams include your primary Cardiologist (physician) and Advanced Practice Providers (APPs -  Physician Assistants and Nurse Practitioners) who all work together to provide you with the care you need, when you need it.  We recommend signing up for the patient portal called "MyChart".  Sign up information is provided on this After Visit Summary.  MyChart is used to connect with patients for Virtual Visits (Telemedicine).  Patients are able to view lab/test results, encounter notes, upcoming appointments, etc.  Non-urgent messages can be sent to your provider as well.   To learn more about what you can do with MyChart, go to [email or web address].    Your next appointment:   Please keep upcoming appointment with Dr. Bjorn Pippin   Other Instructions

## 2022-10-04 ENCOUNTER — Other Ambulatory Visit: Payer: Self-pay | Admitting: Cardiology

## 2022-10-04 ENCOUNTER — Encounter: Payer: Self-pay | Admitting: Pulmonary Disease

## 2022-10-04 ENCOUNTER — Ambulatory Visit: Payer: Medicare HMO | Admitting: Pulmonary Disease

## 2022-10-04 ENCOUNTER — Other Ambulatory Visit: Payer: Self-pay

## 2022-10-04 VITALS — BP 128/70 | HR 85 | Ht 69.0 in | Wt 246.4 lb

## 2022-10-04 DIAGNOSIS — R911 Solitary pulmonary nodule: Secondary | ICD-10-CM | POA: Diagnosis not present

## 2022-10-04 LAB — BASIC METABOLIC PANEL
BUN/Creatinine Ratio: 20 (ref 10–24)
BUN: 25 mg/dL (ref 8–27)
CO2: 27 mmol/L (ref 20–29)
Calcium: 10 mg/dL (ref 8.6–10.2)
Chloride: 98 mmol/L (ref 96–106)
Creatinine, Ser: 1.25 mg/dL (ref 0.76–1.27)
Glucose: 118 mg/dL — ABNORMAL HIGH (ref 70–99)
Potassium: 4 mmol/L (ref 3.5–5.2)
Sodium: 142 mmol/L (ref 134–144)
eGFR: 64 mL/min/{1.73_m2} (ref >59)

## 2022-10-04 NOTE — Progress Notes (Signed)
Patient ID: Joseph Harvey, male    DOB: [birth year], 67 y.o.   MRN: [id number]  Chief Complaint  Patient presents with   Follow-up    Pt states he has been doing okay since last visit. States he has had a couple flare ups since last visit. Went to the ED 5/10 and had a cxr performed which showed a spot on the lung.    Referring provider: Marcine Matar, MD  HPI:   Mr. Joseph Harvey is a 67 y.o. man whom we are seeing in follow-up for dyspnea exertion, asthma.  Most recent cardiology note reviewed.  Recent ED note reviewed.  Went to ED a week and a half ago.  Worsening breathing.  Chest x-ray was obtained.  Given some Lasix and he felt better.  Small right-sided nodule seen on chest x-ray on my review and per radiology report.  He is concerned about this.  Recently followed up with his cardiologist.  They increase his Lasix as well.  With the increase in Lasix his need for rescue inhaler, nebulizer is down significantly.  Begs question of fluid driving primary symptoms.  Long discussion regarding biologic therapy for asthma today.  HPI at initial visit: Patient formerly seen by Dr. Sherene Sires last in 2016.  At last visit symptoms are well controlled with Symbicort.  Unclear exactly what occurred in the last 5 years.  But over the last several months he endorses worsening dyspnea on exertion.  Just walking around going to the gym he has become short of breath.  Endorses severe shortness of breath.  This is resolved within a minute or 2 of albuterol administration.  In general his cough is much better than prior.  Suspect he is adhered well to Dr. Thurston Hole instructions regarding his airway cough syndrome.  He does feel like he has significant nasal congestion and postnasal drip.  This produces mucus in the back of her throat needs to clear her cough up but feels much different than his prior cough.  Scheduled to have sinus surgery but this was discontinued due to wheezing per his report.  His insurance is  changed and now needs to find a new ENT doctor.  When he gets bad bouts of that mucus buildup he will take a dose of prednisone and within a minute, instantly the mucus feels better.  In terms of his dyspnea, rest improves his dyspnea.  There is no other aggravating or alleviating factors.  He has been on Breo for the last several months and says he thinks it helps somewhat with his breathing but is not at the level it was when he was followed by pulmonary prior.  Prior PFTs reviewed and interpreted as suggestive of mild restriction on spirometry, no fixed obstruction, lung volumes revealed TLC of 86% predicted, within normal limits or mildly reduced.  DLCO within normal limits.  Most recent chest x-ray 06/2018 reviewed instructed is clear lungs, current hyperinflation on PA film does not appear hyperinflated on lateral film.  PMH: Anxiety, obesity, diabetes, tobacco abuse, prostate cancer Surgical history: Lumbar back surgery Family History: Father with colon and lung cancer, mother with lung cancer Social history: Grew up in [place], lived there for 35 years, current smoker, 20+ pack year history   Questionaires / Pulmonary Flowsheets:   ACT:  Asthma Control Test ACT Total Score  04/18/2022  8:32 AM 17  03/10/2021  9:51 AM 13  02/26/2020  9:23 AM 9    MMRC:     No data to  display           Epworth:      No data to display           Tests:   FENO:  No results found for: "NITRICOXIDE"  PFT:    Latest Ref Rng & Units 05/26/2021    8:14 AM 02/28/2020   10:53 AM  PFT Results  FVC-Pre L 2.31  2.06   FVC-Predicted Pre % 59  52   FVC-Post L 2.28  2.30   FVC-Predicted Post % 59  59   Pre FEV1/FVC % % 73  70   Post FEV1/FCV % % 72  74   FEV1-Pre L 1.69  1.43   FEV1-Predicted Pre % 56  47   FEV1-Post L 1.65  1.70   DLCO uncorrected ml/min/mmHg 19.07  21.37   DLCO UNC% % 71  80   DLCO corrected ml/min/mmHg 19.07  22.15   DLCO COR %Predicted % 71  82   DLVA Predicted  % 99  123   Personally reviewed and interpreted as mixed restrictive and obstructive physiology with gas trapping, normal DLCO, repeat in 05/2021 showed no longer bronchodilator response likely consistent with better controlled asthma, spirometry overall stable  WALK:      No data to display           Imaging: Reviewed as per EMR  Lab Results: Personally reviewed, no significant elevation of eosinophils, IgE and RAST panel negative in past CBC    Component Value Date/Time   WBC 9.8 09/23/2022 1045   RBC 4.31 09/23/2022 1045   HGB 13.7 09/23/2022 1045   HGB 14.1 12/17/2021 1526   HCT 40.6 09/23/2022 1045   HCT 40.6 12/17/2021 1526   PLT 302 09/23/2022 1045   PLT 345 12/17/2021 1526   MCV 94.2 09/23/2022 1045   MCV 92 12/17/2021 1526   MCH 31.8 09/23/2022 1045   MCHC 33.7 09/23/2022 1045   RDW 12.6 09/23/2022 1045   RDW 13.0 12/17/2021 1526   LYMPHSABS 3.2 02/26/2020 1007   MONOABS 0.7 02/26/2020 1007   EOSABS 0.1 02/26/2020 1007   BASOSABS 0.2 (H) 02/26/2020 1007    BMET    Component Value Date/Time   NA 142 10/03/2022 0953   K 4.0 10/03/2022 0953   CL 98 10/03/2022 0953   CO2 27 10/03/2022 0953   GLUCOSE 118 (H) 10/03/2022 0953   GLUCOSE 149 (H) 09/23/2022 1045   BUN 25 10/03/2022 0953   CREATININE 1.25 10/03/2022 0953   CALCIUM 10.0 10/03/2022 0953   GFRNONAA >60 09/23/2022 1045   GFRAA 68 04/14/2020 1036    BNP    Component Value Date/Time   BNP 85.9 09/23/2022 1421    ProBNP    Component Value Date/Time   PROBNP 336 (H) 09/16/2021 1422       Allergies  Allergen Reactions   Amoxicillin Anaphylaxis    Has patient had a PCN reaction causing immediate rash, facial/tongue/throat swelling, SOB or lightheadedness with hypotension: Yes Has patient had a PCN reaction causing severe rash involving mucus membranes or skin necrosis: No Has patient had a PCN reaction that required hospitalization: Yes Has patient had a PCN reaction occurring within  the last 10 years: Yes If all of the above answers are "NO", then may proceed with Cephalosporin use.    Lisinopril Shortness Of Breath   Molds & Smuts Anaphylaxis   Robitussin [Guaifenesin] Shortness Of Breath    wheezing   Azithromycin Other (See Comments)  Immunization History  Administered Date(s) Administered   DTaP 03/16/2012   Influenza Split 02/07/2013, 02/14/2015, 01/21/2016   Influenza Whole 04/15/2012, 02/10/2020   Influenza, High Dose Seasonal PF 01/28/2019, 12/23/2021   Influenza, Seasonal, Injecte, Preservative Fre 02/07/2013, 02/14/2015, 01/21/2016, 01/27/2017   Influenza,inj,Quad PF,6+ Mos 01/28/2019   Influenza,inj,Quad PF,6-35 Mos 02/08/2021   Influenza,trivalent, recombinat, inj, PF 02/07/2013, 02/14/2015   Influenza-Unspecified 02/07/2013, 02/07/2013, 02/14/2015, 02/14/2015, 01/21/2016, 01/21/2016, 12/22/2016, 12/22/2016, 01/27/2017, 01/27/2017   PFIZER(Purple Top)SARS-COV-2 Vaccination 07/29/2019, 08/19/2019, 01/07/2020, 03/17/2021   PNEUMOCOCCAL CONJUGATE-20 12/08/2021   Pneumococcal Conjugate-13 03/16/2015   Pneumococcal Polysaccharide-23 03/16/2012, 05/16/2013   Tdap 12/19/2016   Zoster Recombinat (Shingrix) 12/19/2016, 02/27/2017    Past Medical History:  Diagnosis Date   Allergy    Dust, mold, dust mites   Anemia    Asthma    Cancer (HCC)    prostate   Cataract    bilateral repair.   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   Pneumonia     Tobacco History: Social History   Tobacco Use  Smoking Status Every Day   Packs/day: 0.75   Years: 46.00   Additional pack years: 0.00   Total pack years: 34.50   Types: Cigarettes   Start date: 1976  Smokeless Tobacco Never  Tobacco Comments   still smoking 0.5 ppd   Ready to quit: Not Answered Counseling given: Not Answered Tobacco comments: still smoking 0.5  ppd      Outpatient Encounter Medications as of 10/04/2022  Medication Sig   acetaminophen (TYLENOL) 325 MG tablet Take 2 tablets (650 mg total) by mouth every 4 (four) hours as needed for headache or mild pain.   albuterol (PROVENTIL) (2.5 MG/3ML) 0.083% nebulizer solution TAKE 3 MLS BY NEBULIZATION EVERY 4 HOURS AS NEEDED FOR WHEEZING OR SHORTNESS OF BREATH (((PLAN B)))   albuterol (VENTOLIN HFA) 108 (90 Base) MCG/ACT inhaler Inhale 2 puffs into the lungs every 4 hours as needed for shortness of breath or wheezing   amLODipine (NORVASC) 5 MG tablet Take 1 tablet (5 mg total) by mouth daily. (Patient taking differently: Take 5 mg by mouth in the morning and at bedtime.)   ASPIRIN 81 PO Take 81 mg by mouth daily.   Blood Glucose Monitoring Suppl (TRUE METRIX METER) w/Device KIT Use as directed   busPIRone (BUSPAR) 10 MG tablet Take 1 tablet (10 mg total) by mouth 3 (three) times daily. (Patient taking differently: Take 10 mg by mouth 3 (three) times daily as needed (anxiety).)   Cholecalciferol (VITAMIN D PO) Take 1 tablet by mouth daily.   diazepam (VALIUM) 10 MG tablet Take 1 tablet (10 mg total) by mouth 60 (sixty) minutes before procedure for 1 dose. Take with a sip of water, on an empty stomach.   ezetimibe (ZETIA) 10 MG tablet Take 1 tablet (10 mg total) by mouth daily.   Fluticasone-Umeclidin-Vilant (TRELEGY ELLIPTA) 200-62.5-25 MCG/ACT AEPB Inhale 1 puff into the lungs daily.   furosemide (LASIX) 40 MG tablet Take 1 tablet (40 mg total) by mouth daily as needed (for weight increase of 3 lbs overnight or 5 lbs in 1 week).   gabapentin (NEURONTIN) 300 MG capsule take 1 capsule by mouth in the morning, then take 1 at lunch and 2 at bedtime (Patient taking differently: Take 300 mg by mouth 3 (three) times daily as needed (  Nerve pain).)   glucose blood (TRUE METRIX BLOOD GLUCOSE TEST) test strip Use as instructed   hydrochlorothiazide (HYDRODIURIL) 25 MG tablet Take 0.5 tablets (12.5 mg  total) by mouth daily.   ipratropium (ATROVENT) 0.06 % nasal spray PLACE 2 SPRAYS INTO BOTH NOSTRILS 3 (THREE) TIMES DAILY. AS NEEDED FOR NASAL CONGESTION, RUNNY NOSE   ipratropium-albuterol (DUONEB) 0.5-2.5 (3) MG/3ML SOLN INHALE 1 VIAL VIA NEBULIZER TWICE A DAY   lidocaine (LIDODERM) 5 % Apply 1 patch to affected area daily and leave on for 12 hours at a time.  Remove after 12 hours   linaclotide (LINZESS) 72 MCG capsule Take 1 capsule (72 mcg total) by mouth daily before breakfast.   metFORMIN (GLUCOPHAGE-XR) 500 MG 24 hr tablet Take 2 tablets (1000 mg total) by mouth in the morning and 1 tablet (500 mg total) by mouth in the evening with meals.   montelukast (SINGULAIR) 10 MG tablet Take 1 tablet (10 mg total) by mouth at bedtime.   Nebivolol HCl 20 MG TABS Take 1 tablet (20 mg total) by mouth 2 (two) times daily.   pantoprazole (PROTONIX) 40 MG tablet Take 1 tablet (40 mg total) by mouth daily as needed.   potassium chloride SA (KLOR-CON M) 20 MEQ tablet Take 1 tablet (20 mEq total) by mouth daily as needed (When hands cramp up).   predniSONE (DELTASONE) 10 MG tablet Take 4 tablets (40 mg total) by mouth daily with breakfast for 5 days, THEN 2 tablets (20 mg total) daily with breakfast for 5 days, THEN 1 tablet (10 mg total) daily with breakfast for 5 days.   Respiratory Therapy Supplies (FLUTTER) DEVI Use three times a day after inhaler or nebulizer use   rosuvastatin (CRESTOR) 20 MG tablet Take 1 tablet (20 mg total) by mouth daily.   silver sulfADIAZINE (SILVADENE) 1 % cream Apply twice a day to affected area   traMADol (ULTRAM) 50 MG tablet Take 1 tablet (50 mg total) by mouth every evening. Each prescription to last 1 mth   TRUEplus Lancets 28G MISC Use as directed   valsartan (DIOVAN) 320 MG tablet Take 1 tablet (320 mg total) by mouth daily.   No facility-administered encounter medications on file as of 10/04/2022.     Review of Systems  Review of Systems  N/a Physical Exam  BP  128/70 (BP Location: Left Arm, Patient Position: Sitting, Cuff Size: Large)   Pulse 85   Ht 5\' 9"  (1.753 m)   Wt 246 lb 6.4 oz (111.8 kg)   SpO2 97% Comment: RA  BMI 36.39 kg/m   Wt Readings from Last 5 Encounters:  10/04/22 246 lb 6.4 oz (111.8 kg)  10/03/22 250 lb (113.4 kg)  09/23/22 260 lb (117.9 kg)  08/08/22 247 lb 12.8 oz (112.4 kg)  07/27/22 249 lb (112.9 kg)    BMI Readings from Last 5 Encounters:  10/04/22 36.39 kg/m  10/03/22 36.92 kg/m  09/23/22 38.40 kg/m  08/08/22 36.59 kg/m  07/27/22 36.77 kg/m     Physical Exam General: Well-appearing, sitting up in exam chair Eyes: EOMI, icterus Neck: No JVP appreciated, neck supple, Respiratory: clear, no wheeze, NWOB  cardiovascular: Regular rate, regular rhythm, no murmurs Abdomen: Nondistended, bowel sounds present MSK: No joint effusion, no synovitis Neuro: Normal gait, no weakness Psych: Normal mood, full affect    Assessment & Plan:   Dyspnea on exertion: Suspect multifactorial.  Asthma as a contributor.   Further discussion as below regarding asthma.  Other contributors include suspected  anxiety/hyperventilation syndrome, obesity, deconditioning, and volume overload.  Frothy sputum, orthopnea endorsed.  Suspect recent worsening due to volume overload.  Improvement in symptoms, less rescue inhaler use, nebulizer use with increasing diuretic therapy recently.  Asthma: He has atopic symptoms.  Improved with triple inhaled therapy in the past.  Intermittently has access appropriate triple inhaled therapies but intermittently too expensive, loses access.  Trelegy more recently, and improved symptoms.  Discussed the role for biologic therapy and poorly controlled asthma.  He is currently smoking so not an ideal candidate.  Encouraged to stop smoking.  However, given worsening symptoms and steroids use we will need to consider using biologic next visit.  Seems like symptoms have improved with Lasix as above, making it  difficult to know if additional asthma therapy would help.   Return in about 6 weeks (around 11/15/2022).   Karren Burly, MD 10/04/2022  I spent 40 minutes in the care of the patient including review of records, face to face visit,  coordination of care.

## 2022-10-04 NOTE — Patient Instructions (Signed)
Nice to see you  Continue Lasix  Continue the inhalers  Lets see if we can work on cutting back on smoking, if so we can consider some additional medicines like injections to help treat asthma  Return to clinic in 6 weeks or sooner as needed with Dr. Judeth Horn

## 2022-10-05 ENCOUNTER — Other Ambulatory Visit: Payer: Self-pay

## 2022-10-05 MED ORDER — VALSARTAN 320 MG PO TABS
320.0000 mg | ORAL_TABLET | Freq: Every day | ORAL | 3 refills | Status: DC
  Filled 2022-10-05: qty 90, 90d supply, fill #0
  Filled 2023-01-18: qty 90, 90d supply, fill #1
  Filled 2023-04-01: qty 90, 90d supply, fill #2
  Filled 2023-07-26: qty 90, 90d supply, fill #3

## 2022-10-06 ENCOUNTER — Other Ambulatory Visit: Payer: Self-pay

## 2022-10-11 ENCOUNTER — Other Ambulatory Visit: Payer: Self-pay

## 2022-10-11 DIAGNOSIS — Z79899 Other long term (current) drug therapy: Secondary | ICD-10-CM

## 2022-10-12 LAB — BASIC METABOLIC PANEL
BUN/Creatinine Ratio: 21 (ref 10–24)
BUN: 34 mg/dL — ABNORMAL HIGH (ref 8–27)
CO2: 28 mmol/L (ref 20–29)
Calcium: 9.6 mg/dL (ref 8.6–10.2)
Chloride: 95 mmol/L — ABNORMAL LOW (ref 96–106)
Creatinine, Ser: 1.65 mg/dL — ABNORMAL HIGH (ref 0.76–1.27)
Glucose: 157 mg/dL — ABNORMAL HIGH (ref 70–99)
Potassium: 3.6 mmol/L (ref 3.5–5.2)
Sodium: 139 mmol/L (ref 134–144)
eGFR: 46 mL/min/{1.73_m2} — ABNORMAL LOW (ref >59)

## 2022-10-14 ENCOUNTER — Other Ambulatory Visit: Payer: Self-pay

## 2022-10-14 ENCOUNTER — Other Ambulatory Visit: Payer: Self-pay | Admitting: Internal Medicine

## 2022-10-14 ENCOUNTER — Ambulatory Visit: Payer: Medicare HMO | Admitting: Cardiology

## 2022-10-14 DIAGNOSIS — T2124XA Burn of second degree of lower back, initial encounter: Secondary | ICD-10-CM

## 2022-10-14 DIAGNOSIS — Z79899 Other long term (current) drug therapy: Secondary | ICD-10-CM

## 2022-10-14 MED ORDER — SILVER SULFADIAZINE 1 % EX CREA
TOPICAL_CREAM | CUTANEOUS | 0 refills | Status: DC
Start: 2022-10-14 — End: 2023-09-14

## 2022-10-14 MED ORDER — SILVER SULFADIAZINE 1 % EX CREA
TOPICAL_CREAM | CUTANEOUS | 0 refills | Status: DC
Start: 2022-10-14 — End: 2022-11-29

## 2022-10-18 ENCOUNTER — Encounter: Payer: Self-pay | Admitting: Pulmonary Disease

## 2022-10-18 ENCOUNTER — Other Ambulatory Visit: Payer: Self-pay | Admitting: Pulmonary Disease

## 2022-10-19 MED ORDER — PREDNISONE 20 MG PO TABS: 40.0000 mg | ORAL_TABLET | Freq: Every day | ORAL | 0 refills | Status: DC

## 2022-10-19 NOTE — Telephone Encounter (Signed)
Rx for prednisone has been sent to pharmacy for pt. Routing to Dr. Judeth Horn for him to review once he is back to see if he is okay with pt having an Rx on file when he needs to get a refill since pt said this was discussed between pt and Dr. Judeth Horn.

## 2022-10-19 NOTE — Telephone Encounter (Signed)
Okay to send prescription for prednisone to be held in reserve in case of any flareup Please send in prednisone 40 mg a day for 5 days

## 2022-10-19 NOTE — Telephone Encounter (Signed)
Called and spoke with pt who states that he is needing a prednisone prescription sent to the pharmacy. Pt said he thought he was told by Dr. Judeth Horn that he could have a prescription on file in case if he needed to get a refill of it when he had a flare up but I stated to pt that I was not showing any Rx on file for prednisone with any refills for him to refill prn and also stated to pt that I was not showing anything in recent OV notes from Dr. Judeth Horn where it said anything about a prednisone Rx to be on file prn.   Told pt that Dr. Judeth Horn was not avail this week but we would send this to one of our providers who is currently covering for his pts to see if we could send something in for him now to help him out with his current flare and stated to him once Dr. Judeth Horn was back that we would check with him to see if he wanted pt to have an Rx on file that he could fill when he needed to and he verbalized understanding.   Pt said that he currently has complaints of wheezing and is coughing some. Is having to use his neb machine at least 2-3 times a day and has been having to use his rescue inhaler at least 3 times a day to help with his symptoms.  Pt is using all other meds as prescribed.    Dr. Isaiah Serge, please advise if you are okay with [place] sending ina pred Rx for pt to help with his flare.

## 2022-10-25 ENCOUNTER — Ambulatory Visit: Payer: Medicare HMO | Admitting: Pulmonary Disease

## 2022-10-26 ENCOUNTER — Telehealth: Payer: Self-pay

## 2022-10-26 MED ORDER — PREDNISONE 10 MG PO TABS: 10.0000 mg | ORAL_TABLET | Freq: Every day | ORAL | 0 refills | Status: DC

## 2022-10-26 NOTE — Telephone Encounter (Signed)
Pt in lobby requesting refill on prednisone 30 mg. States he has no more refills and it has been canceled.This wasn't listed in the last AVS note  Dr.Hunsucker please advise

## 2022-11-04 ENCOUNTER — Ambulatory Visit
Admission: RE | Admit: 2022-11-04 | Discharge: 2022-11-04 | Disposition: A | Discharge status: Home / Self Care | Payer: Medicare HMO | Source: Ambulatory Visit | Attending: Pulmonary Disease | Admitting: Pulmonary Disease

## 2022-11-04 DIAGNOSIS — R911 Solitary pulmonary nodule: Secondary | ICD-10-CM

## 2022-11-07 ENCOUNTER — Other Ambulatory Visit: Payer: Self-pay

## 2022-11-07 DIAGNOSIS — Z79899 Other long term (current) drug therapy: Secondary | ICD-10-CM

## 2022-11-08 ENCOUNTER — Other Ambulatory Visit: Payer: Self-pay | Admitting: Pulmonary Disease

## 2022-11-08 DIAGNOSIS — J4550 Severe persistent asthma, uncomplicated: Secondary | ICD-10-CM

## 2022-11-08 LAB — BASIC METABOLIC PANEL
BUN/Creatinine Ratio: 16 (ref 10–24)
BUN: 26 mg/dL (ref 8–27)
CO2: 24 mmol/L (ref 20–29)
Calcium: 9.3 mg/dL (ref 8.6–10.2)
Chloride: 97 mmol/L (ref 96–106)
Creatinine, Ser: 1.66 mg/dL — ABNORMAL HIGH (ref 0.76–1.27)
Glucose: 169 mg/dL — ABNORMAL HIGH (ref 70–99)
Potassium: 4 mmol/L (ref 3.5–5.2)
Sodium: 138 mmol/L (ref 134–144)
eGFR: 45 mL/min/{1.73_m2} — ABNORMAL LOW (ref >59)

## 2022-11-10 ENCOUNTER — Ambulatory Visit: Payer: Medicare Other | Admitting: Internal Medicine

## 2022-11-11 ENCOUNTER — Other Ambulatory Visit: Payer: Self-pay

## 2022-11-11 MED ORDER — FUROSEMIDE 20 MG PO TABS: ORAL_TABLET | ORAL | 1 refills | Status: DC

## 2022-11-17 ENCOUNTER — Other Ambulatory Visit: Payer: Self-pay | Admitting: Internal Medicine

## 2022-11-17 DIAGNOSIS — K219 Gastro-esophageal reflux disease without esophagitis: Secondary | ICD-10-CM

## 2022-11-18 ENCOUNTER — Other Ambulatory Visit: Payer: Self-pay

## 2022-11-18 MED ORDER — PANTOPRAZOLE SODIUM 40 MG PO TBEC
40.0000 mg | DELAYED_RELEASE_TABLET | Freq: Every day | ORAL | 2 refills | Status: DC | PRN
Start: 2022-11-18 — End: 2023-06-24
  Filled 2022-11-18: qty 30, 30d supply, fill #0

## 2022-11-18 NOTE — Telephone Encounter (Signed)
Requested Prescriptions  Pending Prescriptions Disp Refills   pantoprazole (PROTONIX) 40 MG tablet 30 tablet 2    Sig: Take 1 tablet (40 mg total) by mouth daily as needed.     Gastroenterology: Proton Pump Inhibitors Passed - 11/17/2022  5:16 PM      Passed - Valid encounter within last 12 months    Recent Outpatient Visits           4 months ago Type 2 diabetes mellitus with morbid obesity (HCC)   [facility] Jonah Blue B, MD   8 months ago Type 2 diabetes mellitus with obesity [facility])   [facility] Marcine Matar, MD   10 months ago Chronic maxillary sinusitis   [place] Marcine Matar, MD   11 months ago Lumbar radiculopathy   [place] [facility] Jonah Blue B, MD   1 year ago Severe persistent asthma with allergic rhinitis without complication   [facility] Marcine Matar, MD       Future Appointments             In 1 week [name], MD [facility]   In 1 week Hunsucker, Lesia Sago, MD [facility] [place] [facility]

## 2022-11-27 NOTE — Progress Notes (Signed)
Cardiology Office Note:    Date:  11/29/2022   ID:  Joseph Harvey, DOB 10-29-1955, MRN [id number]  PCP:  Cristino Martes, NP  Cardiologist:  [name], MD  Electrophysiologist:  Regan Lemming, MD   Referring MD: Marcine Matar, MD   Chief Complaint  Patient presents with   Cardiomyopathy    History of Present Illness:    Joseph Harvey is a 67 y.o. male with a hx of prostate cancer in remission, T2DM, hypertension, hyperlipidemia, toabbaco use who presents for follow-up.  He was referred by Dr. Laural Benes for evaluation of dyspnea on exertion, intially seen on 04/14/20.  Hospital he reports that he has been having chest pain with minimal exertion.  Also describes having chest pain.  Reports sharp left-sided chest pain, can occur at rest or with exertion.  Typically last for 10 minutes or so.  Also states he has been having occasional lightheadedness, denies any syncope.  Denies any lower extremity edema.  Reports BP has been anywhere from 110s to 160s when he checks at home.  Reports he had a prior test for sleep apnea which was negative.  He has smoked since age 33, less than 1 pack/day.  No known history of heart disease in his immediate family.  Echocardiogram on 03/18/2020 showed severe septal hypertrophy with otherwise mild concentric hypertrophy, mild intracavitary gradient (peak gradient 30 mmHg), normal biventricular function, grade 1 diastolic dysfunction, no significant valvular disease.  Lexiscan Myoview on 04/29/2020 showed normal perfusion, EF 57%.  Preventives monitor x3 days on 05/06/2020 showed 7 episodes of NSVT, longest lasting 3 beats, occasional PVCs (3.9% of beats).  Calcium score 117 on 04/29/2020 (81st percentile).  Cardiac MRI 06/23/2020 showed asymmetric hypertrophy measuring up to 20 mm in basal anterior septum (8 mm and posterior wall), consistent with hypertrophic cardiomyopathy.  Also with patchy LGE in basal septum and RV insertion sites consistent with  HCM; LGE accounts for 11% of total myocardial mass.  Normal LV/RV size and systolic function.  Echo on 04/14/2021 showed EF of 60 to 65%, severe asymmetric hypertrophy of basal septum, no LVOT obstruction, grade 1 diastolic dysfunction, normal RV function, no significant valvular disease.  Referred to EP for evaluation, underwent PVC ablation with Dr. Elberta Fortis 08/10/2021.  Repeat Zio patch x3 days on 10/07/2021 showed PVCs represent 1.6% of beats.  Echocardiogram 08/2022 showed hyperdynamic LV function with near mid cavity obliteration during systole with gradient up to 32 mmHg with Valsalva.  Since last clinic visit, he reports he is doing okay.  States that BP has been down to 100 when checks at home and he has been having lightheadedness with standing.  He is taking Lasix every other day.  Also reports having tightness on left side of chest recently.  Lasts for about 15 minutes.  Can occur with exertion.  Also reports getting short of breath with minimal exertion.  Does report symptoms improved when he uses his nebulizer.  Denies any syncope.  Reports compliance with CPAP.  Continues to smoke, about 5 to 10 cigarettes/day.  Wt Readings from Last 3 Encounters:  11/29/22 246 lb (111.6 kg)  10/04/22 246 lb 6.4 oz (111.8 kg)  10/03/22 250 lb (113.4 kg)     BP Readings from Last 3 Encounters:  11/29/22 128/60  10/04/22 128/70  10/03/22 132/84    Wt Readings from Last 3 Encounters:  11/29/22 246 lb (111.6 kg)  10/04/22 246 lb 6.4 oz (111.8 kg)  10/03/22 250 lb (113.4 kg)  Past Medical History:  Diagnosis Date   Allergy    Dust, mold, dust mites   Anemia    Asthma    Cancer (HCC)    prostate   Cataract    bilateral repair.   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   Pneumonia     Past Surgical History:  Procedure Laterality Date   BACK SURGERY  2017    CATARACT EXTRACTION Bilateral    COLONOSCOPY     2013   COLONOSCOPY  10/03/2019   KNEE ARTHROSCOPY  2005   right   PROSTATE SURGERY     PVC ABLATION N/A 08/10/2021   Procedure: PVC ABLATION;  Surgeon: Regan Lemming, MD;  Location: [facility];  Service: Cardiovascular;  Laterality: N/A;   ROBOT ASSISTED LAPAROSCOPIC RADICAL PROSTATECTOMY  03/2010    Current Medications: Current Meds  Medication Sig   acetaminophen (TYLENOL) 325 MG tablet Take 2 tablets (650 mg total) by mouth every 4 (four) hours as needed for headache or mild pain.   albuterol (PROVENTIL) (2.5 MG/3ML) 0.083% nebulizer solution TAKE 3 MLS BY NEBULIZATION EVERY 4 HOURS AS NEEDED FOR WHEEZING OR SHORTNESS OF BREATH (((PLAN B)))   albuterol (VENTOLIN HFA) 108 (90 Base) MCG/ACT inhaler Inhale 2 puffs into the lungs every 4 hours as needed for shortness of breath or wheezing   amLODipine (NORVASC) 5 MG tablet Take 1 tablet (5 mg total) by mouth daily. (Patient taking differently: Take 5 mg by mouth in the morning and at bedtime.)   ASPIRIN 81 PO Take 81 mg by mouth daily.   Blood Glucose Monitoring Suppl (TRUE METRIX METER) w/Device KIT Use as directed   busPIRone (BUSPAR) 10 MG tablet Take 1 tablet (10 mg total) by mouth 3 (three) times daily. (Patient taking differently: Take 10 mg by mouth 3 (three) times daily as needed (anxiety).)   Cholecalciferol (VITAMIN D PO) Take 1 tablet by mouth daily.   diazepam (VALIUM) 10 MG tablet Take 1 tablet (10 mg total) by mouth 60 (sixty) minutes before procedure for 1 dose. Take with a sip of water, on an empty stomach.   ezetimibe (ZETIA) 10 MG tablet Take 1 tablet (10 mg total) by mouth daily.   furosemide (LASIX) 20 MG tablet Take 1 tablet for weight gain of 3lbs per night or 5lbs in one week   gabapentin (NEURONTIN) 300 MG capsule take 1 capsule by mouth in the morning, then take 1 at lunch and 2 at bedtime (Patient taking differently: Take 300 mg by mouth 3 (three) times  daily as needed (Nerve pain).)   glucose blood (TRUE METRIX BLOOD GLUCOSE TEST) test strip Use as instructed   ipratropium (ATROVENT) 0.06 % nasal spray PLACE 2 SPRAYS INTO BOTH NOSTRILS 3 (THREE) TIMES DAILY. AS NEEDED FOR NASAL CONGESTION, RUNNY NOSE   ipratropium-albuterol (DUONEB) 0.5-2.5 (3) MG/3ML SOLN INHALE 1 VIAL VIA NEBULIZER TWICE A DAY   linaclotide (LINZESS) 72 MCG capsule Take 1 capsule (72 mcg total) by mouth daily before breakfast.   metFORMIN (GLUCOPHAGE-XR) 500 MG 24 hr tablet Take 2 tablets (1000 mg total) by mouth in the morning and 1 tablet (500 mg total) by mouth in the evening with meals.   montelukast (SINGULAIR) 10 MG tablet Take 1 tablet (10 mg total) by mouth at bedtime.   Nebivolol HCl  20 MG TABS Take 1 tablet (20 mg total) by mouth 2 (two) times daily.   pantoprazole (PROTONIX) 40 MG tablet Take 1 tablet (40 mg total) by mouth daily as needed.   potassium chloride SA (KLOR-CON M) 20 MEQ tablet Take 1 tablet (20 mEq total) by mouth daily as needed (When hands cramp up).   predniSONE (DELTASONE) 10 MG tablet Take 1 tablet (10 mg total) by mouth daily with breakfast.   Respiratory Therapy Supplies (FLUTTER) DEVI Use three times a day after inhaler or nebulizer use   rosuvastatin (CRESTOR) 20 MG tablet Take 1 tablet (20 mg total) by mouth daily.   silver sulfADIAZINE (SILVADENE) 1 % cream Apply twice a day to affected area   traMADol (ULTRAM) 50 MG tablet Take 1 tablet (50 mg total) by mouth every evening. Each prescription to last 1 mth   TRUEplus Lancets 28G MISC Use as directed   valsartan (DIOVAN) 320 MG tablet Take 1 tablet (320 mg total) by mouth daily.   [DISCONTINUED] hydrochlorothiazide (HYDRODIURIL) 25 MG tablet Take 0.5 tablets (12.5 mg total) by mouth daily. (Patient taking differently: Take 25 mg by mouth daily.)   [DISCONTINUED] silver sulfADIAZINE (SILVADENE) 1 % cream Apply twice a day to affected area     Allergies:   Amoxicillin, Lisinopril, Molds &  smuts, Robitussin [guaifenesin], and Azithromycin   Social History   Socioeconomic History   Marital status: Single    Spouse name: Not on file   Number of children: 2   Years of education: 12 grade   Highest education level: Not on file  Occupational History   Occupation: Facilities manager: [employer] ([employer]  Tobacco Use   Smoking status: Every Day    Current packs/day: 0.75    Average packs/day: 0.8 packs/day for 48.5 years (36.4 ttl pk-yrs)    Types: Cigarettes    Start date: 1976   Smokeless tobacco: Never   Tobacco comments:    still smoking 0.5 ppd  Vaping Use   Vaping status: Never Used  Substance and Sexual Activity   Alcohol use: Not Currently    Alcohol/week: 3.0 standard drinks of alcohol    Types: 3 Cans of beer per week   Drug use: No   Sexual activity: Not on file  Other Topics Concern   Not on file  Social History Narrative   Not on file   Social Determinants of Health   Financial Resource Strain: Not on file  Food Insecurity: Not on file  Transportation Needs: Not on file  Physical Activity: Not on file  Stress: No Stress Concern Present (05/26/2020)   [employer] of Occupational Health - Occupational Stress Questionnaire    Feeling of Stress : Only a little  Social Connections: Not on file     Family History: The patient's family history includes Allergies in his brother; Asthma in his mother; Colon cancer in his father; Diabetes in his maternal grandmother; Lung cancer in his father and mother; Prostate cancer in his father. There is no history of Colon polyps, Esophageal cancer, Stomach cancer, or Rectal cancer.  ROS:   Please see the history of present illness.     All other systems reviewed and are negative.  EKGs/Labs/Other Studies Reviewed:    The following studies were reviewed today:   EKG:   09/24/21: Normal sinus rhythm, no PVCs, poor R wave progression 05/05/21: Normal sinus rhythm, PVCs, Q waves in V1/2 04/14/20: normal  sinus rhythm, PVCs, no ST abnormalities  05/18/20: EKG was not ordered. 07/14/20: EKG was not ordered. 09/17/20: Sinus rhythm. Rate 96 bpm PVCs, No ST abnormalities. 03/25/21: Normal sinus rhythm, frequent PVCs (trigeminy)  Recent Labs: 08/08/2022: ALT 27 09/23/2022: B Natriuretic Peptide 85.9; Hemoglobin 13.7; Platelets 302 11/07/2022: BUN 26; Creatinine, Ser 1.66; Potassium 4.0; Sodium 138  Recent Lipid Panel    Component Value Date/Time   CHOL 135 08/08/2022 0838   TRIG 242 (H) 08/08/2022 0838   HDL 59 08/08/2022 0838   CHOLHDL 2.3 08/08/2022 0838   LDLCALC 39 08/08/2022 0838    Physical Exam:    VS:  BP 128/60 (BP Location: Right Arm, Patient Position: Sitting, Cuff Size: Normal)   Pulse 85   Ht 5\' 9"  (1.753 m)   Wt 246 lb (111.6 kg)   SpO2 94%   BMI 36.33 kg/m     Wt Readings from Last 3 Encounters:  11/29/22 246 lb (111.6 kg)  10/04/22 246 lb 6.4 oz (111.8 kg)  10/03/22 250 lb (113.4 kg)     GEN:  Well nourished, well developed in no acute distress HEENT: Normal NECK: No JVD; No carotid bruits LYMPHATICS: No lymphadenopathy CARDIAC: RRR, no murmurs, rubs, gallops RESPIRATORY: Expiratory wheezing ABDOMEN: Soft, non-tender, non-distended MUSCULOSKELETAL: trace edema; No deformity  SKIN: Warm and dry NEUROLOGIC:  Alert and oriented x 3 PSYCHIATRIC:  Normal affect   ASSESSMENT:    1. Hypertrophic cardiomyopathy (HCC)   2. Medication management   3. AKI (acute kidney injury) (HCC)   4. Chest pain of uncertain etiology   5. DOE (dyspnea on exertion)   6. Hyperlipidemia LDL goal <70   7. Essential hypertension   8. Tobacco abuse        PLAN:    HCM: Cardiac MRI 06/23/2020 showed asymmetric hypertrophy measuring up to 20 mm and basal anterior septum (8 mm and posterior wall), consistent with hypertrophic cardiomyopathy.  Also with patchy LGE in basal septum and RV insertion sites consistent with HCM; LGE accounts for 11% of total myocardial mass.  Normal LV/RV size  and systolic function.  Echocardiogram on 03/18/2020 showed mild LVOT obstruction, peak gradient 13 mmHg.  Echocardiogram 08/2022 showed hyperdynamic LV function with near mid cavity obliteration during systole with gradient up to 32 mmHg with Valsalva. -Recommended first-degree relatives be screened for HCM: He reports he has talked to his daughters and brother/sisters.  Discussed genetic testing but he declined -Ideally try to avoid diuretics in HCM but he is volume overloaded on exam.  Started on HCTZ 25 mg daily, repeat echo on 08/2022 showed worsening LVOT obstruction as above, HCTZ dose reduced to 12.5 mg daily.  He is having recent lightheadedness with standing and reports low BP when checks at home, recommend discontinuing HCTZ.  Takes as needed Lasix if gains more than 3 pounds in 1 day or 5 pounds in 1 week -Continue nebivolol 20 mg BID -NSVT, frequent PVCs on Zio patch.  Referred to EP for evaluation, underwent PVC ablation with Dr Elberta Fortis on 08/10/21  Lower extremity edema: Bilateral swelling in feet/ankles.  Normal BNP, CMET 07/2022.  Suspect related to amlodipine use, dose reduced to 5 mg daily.  Trace edema on exam today, encouraged use of compression stockings  Chest pain/DOE: Chest pain atypical in description but does have CAD risk factors (hypertension, hyperlipidemia, T2DM).   Lexiscan Myoview on 04/29/2020 showed normal perfusion, EF 57%.  Calcium score 117 on 04/29/2020 (81st percentile). -He is reporting worsening chest pain recently, atypical description though can occur with exertion.  Recommend coronary CTA for further evaluation.  However did have AKI on most recent labs, will check BMET.  If creatinine improved, would plan for coronary CTA.  Likely not a good candidate for Myoview or PET given his lung disease with wheezing on exam in clinic today  PVCs: [employer] monitor x3 days on 05/06/2020 showed 7 episodes of NSVT, longest lasting 3 beats, occasional PVCs (3.9% of beats).  Zio  patch x3 days on 04/06/2021 showed frequent PVCs (14.3% of beats), 1 episode of NSVT x4 beats -Referred to EP for evaluation, underwent PVC ablation with Dr. Elberta Fortis 08/10/2021.  Significantly improved, repeat Zio patch x3 days on 10/07/2021 showed PVCs represent 1.6% of beats.  Leg pain: Normal ABIs on 04/23/2019  Hypertension: On amlodipine 5 mg daily, HCTZ 12.5 mg daily, valsartan 320 mg, Bystolic 20 mg daily twice daily.  Discontinue HCTZ as above.  Asked to check BP twice daily for next week and let us know results  Tobacco use: Patient counseled on the risk of tobacco use and cessation strongly encouraged.  Hyperlipidemia: On rosuvastatin 40 mg daily and Zetia 10 mg daily, LDL 53 on 03/25/2021.  Calcium score 117 on 04/29/2020 (81st percentile).  LDL 24 08/22/2021, rosuvastatin decreased to 20 mg daily.  LDL 39 on 08/08/2022  T2DM: On Metformin.  A1c 8% on 07/04/2022  OSA: Sleep study 10/2020 showed severe OSA, he is now on CPAP and reports compliance  RTC in 3 months   Medication Adjustments/Labs and Tests Ordered: Current medicines are reviewed at length with the patient today.  Concerns regarding medicines are outlined above.  Orders Placed This Encounter  Procedures   Basic metabolic panel   Magnesium    No orders of the defined types were placed in this encounter.    Patient Instructions  Medication Instructions:  Stop taking Hydrochlorothiazide   Take Furosemide ( Lasix)   as needed  *If you need a refill on your cardiac medications before your next appointment, please call your pharmacy*   Other Instructions  Weigh daily  if weight gains more than 3 lb  then take  Furosemide and potassium tablet.   Check your blood pressure  for 1 week, twice a day  and send reading to Dr Bjorn Pippin through  [employer]  Lab Work: BMP Magnesium  If you have labs (blood work) drawn today and your tests are completely normal, you will receive your results only by: MyChart Message (if you  have MyChart) OR A paper copy in the mail If you have any lab test that is abnormal or we need to change your treatment, we will call you to review the results.   Testing/Procedures   Follow-Up: At [facility], you and your health needs are our priority.  As part of our continuing mission to provide you with exceptional heart care, we have created designated Provider Care Teams.  These Care Teams include your primary Cardiologist (physician) and Advanced Practice Providers (APPs -  Physician Assistants and Nurse Practitioners) who all work together to provide you with the care you need, when you need it.     Your next appointment:   3 month(s)  The format for your next appointment:   In Person  Provider:   [name], MD    Other Instructions   Weigh daily  if weight gains more than 3 lb  then take  Furosemide and potassium tablet.  Check your blood pressure  for 2 weeks at least once a day send reading to  Dr Bjorn Pippin through  myChart         Signed, [name], MD  11/29/2022 8:45 AM    [facility]

## 2022-11-29 ENCOUNTER — Ambulatory Visit: Payer: Medicare HMO | Attending: Cardiology | Admitting: Cardiology

## 2022-11-29 ENCOUNTER — Ambulatory Visit: Payer: Medicare HMO | Admitting: Pulmonary Disease

## 2022-11-29 ENCOUNTER — Encounter: Payer: Self-pay | Admitting: Cardiology

## 2022-11-29 ENCOUNTER — Encounter: Payer: Self-pay | Admitting: Pulmonary Disease

## 2022-11-29 VITALS — BP 118/74 | HR 85 | Ht 69.0 in | Wt 244.0 lb

## 2022-11-29 VITALS — BP 128/60 | HR 85 | Ht 69.0 in | Wt 246.0 lb

## 2022-11-29 DIAGNOSIS — I422 Other hypertrophic cardiomyopathy: Secondary | ICD-10-CM

## 2022-11-29 DIAGNOSIS — I10 Essential (primary) hypertension: Secondary | ICD-10-CM

## 2022-11-29 DIAGNOSIS — Z79899 Other long term (current) drug therapy: Secondary | ICD-10-CM | POA: Diagnosis not present

## 2022-11-29 DIAGNOSIS — Z720 Tobacco use: Secondary | ICD-10-CM

## 2022-11-29 DIAGNOSIS — R079 Chest pain, unspecified: Secondary | ICD-10-CM | POA: Diagnosis not present

## 2022-11-29 DIAGNOSIS — R0609 Other forms of dyspnea: Secondary | ICD-10-CM

## 2022-11-29 DIAGNOSIS — J4551 Severe persistent asthma with (acute) exacerbation: Secondary | ICD-10-CM

## 2022-11-29 DIAGNOSIS — N179 Acute kidney failure, unspecified: Secondary | ICD-10-CM | POA: Diagnosis not present

## 2022-11-29 DIAGNOSIS — E785 Hyperlipidemia, unspecified: Secondary | ICD-10-CM

## 2022-11-29 MED ORDER — METHYLPREDNISOLONE ACETATE 80 MG/ML IJ SUSP
80.0000 mg | Freq: Once | INTRAMUSCULAR | Status: DC
Start: 2022-11-29 — End: 2022-11-29

## 2022-11-29 MED ORDER — PREDNISONE 10 MG PO TABS: 30.0000 mg | ORAL_TABLET | Freq: Every day | ORAL | 11 refills | Status: DC | PRN

## 2022-11-29 MED ORDER — METHYLPREDNISOLONE ACETATE 80 MG/ML IJ SUSP
80.0000 mg | Freq: Once | INTRAMUSCULAR | Status: AC
Start: 2022-11-29 — End: 2022-11-29
  Administered 2022-11-29: 80 mg via INTRAMUSCULAR

## 2022-11-29 NOTE — Patient Instructions (Addendum)
Medication Instructions:  Stop taking Hydrochlorothiazide   Take Furosemide ( Lasix)   as needed  *If you need a refill on your cardiac medications before your next appointment, please call your pharmacy*   Other Instructions  Weigh daily  if weight gains more than 3 lb  then take  Furosemide and potassium tablet.   Check your blood pressure  for 1 week, twice a day  and send reading to Dr Bjorn Pippin through  [employer]  Lab Work: BMP Magnesium  If you have labs (blood work) drawn today and your tests are completely normal, you will receive your results only by: MyChart Message (if you have MyChart) OR A paper copy in the mail If you have any lab test that is abnormal or we need to change your treatment, we will call you to review the results.   Testing/Procedures   Follow-Up: At [facility], you and your health needs are our priority.  As part of our continuing mission to provide you with exceptional heart care, we have created designated Provider Care Teams.  These Care Teams include your primary Cardiologist (physician) and Advanced Practice Providers (APPs -  Physician Assistants and Nurse Practitioners) who all work together to provide you with the care you need, when you need it.     Your next appointment:   3 month(s)  The format for your next appointment:   In Person  Provider:   [name], MD    Other Instructions   Weigh daily  if weight gains more than 3 lb  then take  Furosemide and potassium tablet.  Check your blood pressure  for 2 weeks at least once a day send reading to Dr Bjorn Pippin through  [facility]

## 2022-11-29 NOTE — Progress Notes (Signed)
Patient ID: Joseph Harvey, male    DOB: [birth year], 67 y.o.   MRN: [id number]  Chief Complaint  Patient presents with   Follow-up    6 wk f/u, states his breathing has been stable since last visit.     Referring provider: Marcine Matar, MD  HPI:   Joseph Harvey is a 67 y.o. man whom we are seeing in follow-up for dyspnea exertion, asthma.  Most recent cardiology note reviewed.    Needing prednisone pretty frequently now.  Takes 30 mg at a time.  Improves things but needs some more.  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  Medicine on a daily basis but given the frequency steroid-dependent.  Does help.  As does his nebulizer therapies.  Working with cardiology regarding medications, diuretics with diastolic dysfunction heart failure as well as managing kidney issues.  Long discussion regarding biologic therapy for asthma today.  He is amenable to start.  HPI at initial visit: Patient formerly seen by Dr. Sherene Sires last in 2016.  At last visit symptoms are well controlled with Symbicort.  Unclear exactly what occurred in the last 5 years.  But over the last several months he endorses worsening dyspnea on exertion.  Just walking around going to the gym he has become short of breath.  Endorses severe shortness of breath.  This is resolved within a minute or 2 of albuterol administration.  In general his cough is much better than prior.  Suspect he is adhered well to Dr. Thurston Hole instructions regarding his airway cough syndrome.  He does feel like he has significant nasal congestion and postnasal drip.  This produces mucus in the back of her throat needs to clear her cough up but feels much different than his prior cough.  Scheduled to have sinus surgery but this was discontinued due to wheezing per his report.  His insurance is changed and now needs to find a new ENT doctor.  When he gets bad bouts of that mucus buildup he will take a dose of prednisone and within a minute, instantly the mucus  feels better.  In terms of his dyspnea, rest improves his dyspnea.  There is no other aggravating or alleviating factors.  He has been on Breo for the last several months and says he thinks it helps somewhat with his breathing but is not at the level it was when he was followed by pulmonary prior.  Prior PFTs reviewed and interpreted as suggestive of mild restriction on spirometry, no fixed obstruction, lung volumes revealed TLC of 86% predicted, within normal limits or mildly reduced.  DLCO within normal limits.  Most recent chest x-ray 06/2018 reviewed instructed is clear lungs, current hyperinflation on PA film does not appear hyperinflated on lateral film.  PMH: Anxiety, obesity, diabetes, tobacco abuse, prostate cancer Surgical history: Lumbar back surgery Family History: Father with colon and lung cancer, mother with lung cancer Social history: G[place] up in [place], lived there for 35 years, current smoker, 20+ pack year history   Questionaires / Pulmonary Flowsheets:   ACT:  Asthma Control Test ACT Total Score  04/18/2022  8:32 AM 17  03/10/2021  9:51 AM 13  02/26/2020  9:23 AM 9    MMRC:     No data to display          Epworth:      No data to display          Tests:   FENO:  No results found for: "NITRICOXIDE"  PFT:  Latest Ref Rng & Units 05/26/2021    8:14 AM 02/28/2020   10:53 AM  PFT Results  FVC-Pre L 2.31  2.06   FVC-Predicted Pre % 59  52   FVC-Post L 2.28  2.30   FVC-Predicted Post % 59  59   Pre FEV1/FVC % % 73  70   Post FEV1/FCV % % 72  74   FEV1-Pre L 1.69  1.43   FEV1-Predicted Pre % 56  47   FEV1-Post L 1.65  1.70   DLCO uncorrected ml/min/mmHg 19.07  21.37   DLCO UNC% % 71  80   DLCO corrected ml/min/mmHg 19.07  22.15   DLCO COR %Predicted % 71  82   DLVA Predicted % 99  123   Personally reviewed and interpreted as mixed restrictive and obstructive physiology with gas trapping, normal DLCO, repeat in 05/2021 showed no longer  bronchodilator response likely consistent with better controlled asthma, spirometry overall stable  WALK:      No data to display          Imaging: Reviewed as per EMR  Lab Results: Personally reviewed, no significant elevation of eosinophils, IgE and RAST panel negative in past CBC    Component Value Date/Time   WBC 9.8 09/23/2022 1045   RBC 4.31 09/23/2022 1045   HGB 13.7 09/23/2022 1045   HGB 14.1 12/17/2021 1526   HCT 40.6 09/23/2022 1045   HCT 40.6 12/17/2021 1526   PLT 302 09/23/2022 1045   PLT 345 12/17/2021 1526   MCV 94.2 09/23/2022 1045   MCV 92 12/17/2021 1526   MCH 31.8 09/23/2022 1045   MCHC 33.7 09/23/2022 1045   RDW 12.6 09/23/2022 1045   RDW 13.0 12/17/2021 1526   LYMPHSABS 3.2 02/26/2020 1007   MONOABS 0.7 02/26/2020 1007   EOSABS 0.1 02/26/2020 1007   BASOSABS 0.2 (H) 02/26/2020 1007    BMET    Component Value Date/Time   NA 138 11/07/2022 1124   K 4.0 11/07/2022 1124   CL 97 11/07/2022 1124   CO2 24 11/07/2022 1124   GLUCOSE 169 (H) 11/07/2022 1124   GLUCOSE 149 (H) 09/23/2022 1045   BUN 26 11/07/2022 1124   CREATININE 1.66 (H) 11/07/2022 1124   CALCIUM 9.3 11/07/2022 1124   GFRNONAA >60 09/23/2022 1045   GFRAA 68 04/14/2020 1036    BNP    Component Value Date/Time   BNP 85.9 09/23/2022 1421    ProBNP    Component Value Date/Time   PROBNP 336 (H) 09/16/2021 1422       Allergies  Allergen Reactions   Amoxicillin Anaphylaxis    Has patient had a PCN reaction causing immediate rash, facial/tongue/throat swelling, SOB or lightheadedness with hypotension: Yes Has patient had a PCN reaction causing severe rash involving mucus membranes or skin necrosis: No Has patient had a PCN reaction that required hospitalization: Yes Has patient had a PCN reaction occurring within the last 10 years: Yes If all of the above answers are "NO", then may proceed with Cephalosporin use.    Lisinopril Shortness Of Breath   Molds & Smuts  Anaphylaxis   Robitussin [Guaifenesin] Shortness Of Breath    wheezing   Azithromycin Other (See Comments)    Immunization History  Administered Date(s) Administered   DTaP 03/16/2012   Influenza Split 02/07/2013, 02/14/2015, 01/21/2016   Influenza Whole 04/15/2012, 02/10/2020   Influenza, High Dose Seasonal PF 01/28/2019, 12/23/2021   Influenza, Seasonal, Injecte, Preservative Fre 02/07/2013, 02/14/2015, 01/21/2016, 01/27/2017  Influenza,inj,Quad PF,6+ Mos 01/28/2019   Influenza,inj,Quad PF,6-35 Mos 02/08/2021   Influenza,trivalent, recombinat, inj, PF 02/07/2013, 02/14/2015   Influenza-Unspecified 02/07/2013, 02/07/2013, 02/14/2015, 02/14/2015, 01/21/2016, 01/21/2016, 12/22/2016, 12/22/2016, 01/27/2017, 01/27/2017   PFIZER(Purple Top)SARS-COV-2 Vaccination 07/29/2019, 08/19/2019, 01/07/2020, 03/17/2021   PNEUMOCOCCAL CONJUGATE-20 12/08/2021   Pneumococcal Conjugate-13 03/16/2015   Pneumococcal Polysaccharide-23 03/16/2012, 05/16/2013   Tdap 12/19/2016   Zoster Recombinant(Shingrix) 12/19/2016, 02/27/2017    Past Medical History:  Diagnosis Date   Allergy    Dust, mold, dust mites   Anemia    Asthma    Cancer (HCC)    prostate   Cataract    bilateral repair.   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   Pneumonia     Tobacco History: Social History   Tobacco Use  Smoking Status Every Day   Current packs/day: 0.75   Average packs/day: 0.8 packs/day for 48.5 years (36.4 ttl pk-yrs)   Types: Cigarettes   Start date: 1976  Smokeless Tobacco Never  Tobacco Comments   still smoking 0.5 ppd   Ready to quit: Not Answered Counseling given: Not Answered Tobacco comments: still smoking 0.5 ppd      Outpatient Encounter Medications as of 11/29/2022  Medication Sig   acetaminophen (TYLENOL) 325 MG tablet Take 2 tablets (650 mg total) by  mouth every 4 (four) hours as needed for headache or mild pain.   albuterol (PROVENTIL) (2.5 MG/3ML) 0.083% nebulizer solution TAKE 3 MLS BY NEBULIZATION EVERY 4 HOURS AS NEEDED FOR WHEEZING OR SHORTNESS OF BREATH (((PLAN B)))   albuterol (VENTOLIN HFA) 108 (90 Base) MCG/ACT inhaler Inhale 2 puffs into the lungs every 4 hours as needed for shortness of breath or wheezing   amLODipine (NORVASC) 5 MG tablet Take 1 tablet (5 mg total) by mouth daily. (Patient taking differently: Take 5 mg by mouth in the morning and at bedtime.)   ASPIRIN 81 PO Take 81 mg by mouth daily.   Blood Glucose Monitoring Suppl (TRUE METRIX METER) w/Device KIT Use as directed   busPIRone (BUSPAR) 10 MG tablet Take 1 tablet (10 mg total) by mouth 3 (three) times daily. (Patient taking differently: Take 10 mg by mouth 3 (three) times daily as needed (anxiety).)   Cholecalciferol (VITAMIN D PO) Take 1 tablet by mouth daily.   diazepam (VALIUM) 10 MG tablet Take 1 tablet (10 mg total) by mouth 60 (sixty) minutes before procedure for 1 dose. Take with a sip of water, on an empty stomach.   ezetimibe (ZETIA) 10 MG tablet Take 1 tablet (10 mg total) by mouth daily.   furosemide (LASIX) 20 MG tablet Take 1 tablet for weight gain of 3lbs per night or 5lbs in one week   gabapentin (NEURONTIN) 300 MG capsule take 1 capsule by mouth in the morning, then take 1 at lunch and 2 at bedtime (Patient taking differently: Take 300 mg by mouth 3 (three) times daily as needed (Nerve pain).)   glucose blood (TRUE METRIX BLOOD GLUCOSE TEST) test strip Use as instructed   ipratropium (ATROVENT) 0.06 % nasal spray PLACE 2 SPRAYS INTO BOTH NOSTRILS 3 (THREE) TIMES DAILY. AS NEEDED FOR NASAL CONGESTION, RUNNY NOSE   ipratropium-albuterol (DUONEB) 0.5-2.5 (3) MG/3ML SOLN INHALE 1 VIAL VIA NEBULIZER TWICE A DAY   lidocaine (LIDODERM) 5 % Apply 1 patch to affected area daily and leave on  for 12 hours at a time.  Remove after 12 hours   linaclotide (LINZESS)  72 MCG capsule Take 1 capsule (72 mcg total) by mouth daily before breakfast.   metFORMIN (GLUCOPHAGE-XR) 500 MG 24 hr tablet Take 2 tablets (1000 mg total) by mouth in the morning and 1 tablet (500 mg total) by mouth in the evening with meals.   montelukast (SINGULAIR) 10 MG tablet Take 1 tablet (10 mg total) by mouth at bedtime.   Nebivolol HCl 20 MG TABS Take 1 tablet (20 mg total) by mouth 2 (two) times daily.   pantoprazole (PROTONIX) 40 MG tablet Take 1 tablet (40 mg total) by mouth daily as needed.   potassium chloride SA (KLOR-CON M) 20 MEQ tablet Take 1 tablet (20 mEq total) by mouth daily as needed (When hands cramp up).   predniSONE (DELTASONE) 10 MG tablet Take 3 tablets (30 mg total) by mouth daily as needed.   Respiratory Therapy Supplies (FLUTTER) DEVI Use three times a day after inhaler or nebulizer use   rosuvastatin (CRESTOR) 20 MG tablet Take 1 tablet (20 mg total) by mouth daily.   silver sulfADIAZINE (SILVADENE) 1 % cream Apply twice a day to affected area   traMADol (ULTRAM) 50 MG tablet Take 1 tablet (50 mg total) by mouth every evening. Each prescription to last 1 mth   TRUEplus Lancets 28G MISC Use as directed   valsartan (DIOVAN) 320 MG tablet Take 1 tablet (320 mg total) by mouth daily.   [DISCONTINUED] Fluticasone-Umeclidin-Vilant (TRELEGY ELLIPTA) 200-62.5-25 MCG/ACT AEPB Inhale 1 puff into the lungs daily.   [DISCONTINUED] predniSONE (DELTASONE) 10 MG tablet Take 1 tablet (10 mg total) by mouth daily with breakfast.   [DISCONTINUED] hydrochlorothiazide (HYDRODIURIL) 25 MG tablet Take 0.5 tablets (12.5 mg total) by mouth daily. (Patient taking differently: Take 25 mg by mouth daily.)   [DISCONTINUED] silver sulfADIAZINE (SILVADENE) 1 % cream Apply twice a day to affected area   Facility-Administered Encounter Medications as of 11/29/2022  Medication   methylPREDNISolone acetate (DEPO-MEDROL) injection 80 mg     Review of Systems  Review of Systems  N/a Physical  Exam  BP 118/74   Pulse 85   Ht 5\' 9"  (1.753 m)   Wt 244 lb (110.7 kg)   SpO2 97% Comment: on RA  BMI 36.03 kg/m   Wt Readings from Last 5 Encounters:  11/29/22 244 lb (110.7 kg)  11/29/22 246 lb (111.6 kg)  10/04/22 246 lb 6.4 oz (111.8 kg)  10/03/22 250 lb (113.4 kg)  09/23/22 260 lb (117.9 kg)    BMI Readings from Last 5 Encounters:  11/29/22 36.03 kg/m  11/29/22 36.33 kg/m  10/04/22 36.39 kg/m  10/03/22 36.92 kg/m  09/23/22 38.40 kg/m     Physical Exam General: Well-appearing, sitting up in exam chair Eyes: EOMI, icterus Neck: No JVP appreciated, neck supple, Respiratory: clear, no wheeze, NWOB  cardiovascular: Regular rate, regular rhythm, no murmurs Abdomen: Nondistended, bowel sounds present MSK: No joint effusion, no synovitis Neuro: Normal gait, no weakness Psych: Normal mood, full affect    Assessment & Plan:   Dyspnea on exertion: Suspect multifactorial.  Asthma as a contributor.   Further discussion as below regarding asthma.  Other contributors include suspected anxiety/hyperventilation syndrome, obesity, deconditioning, and volume overload.  Frothy sputum, orthopnea endorsed.  Suspect some of the recent worsening due to volume overload.  Improvement in symptoms, less rescue inhaler use, nebulizer use with increasing diuretic therapy recently.  Asthma with exacerbation: He has atopic  symptoms.  Improved with triple inhaled therapy in the past.  Intermittently has access appropriate triple inhaled therapies but intermittently too expensive, loses access.  Trelegy more recently, and improved symptoms.  Discussed the role for biologic therapy and poorly controlled asthma.  He is currently smoking so not an ideal candidate.  Encouraged to stop smoking.  However, given worsening symptoms and steroid dependence, we will move forward with Biologics, Dupixent given steroid dependence.  New prescription for prednisone as needed.  Solu-Medrol injection  today.  Lung nodules: 53-month follow-up 10/2022 with resolution of lung nodule.  Return to yearly lung cancer screening scans.   Return in about 3 months (around 03/01/2023).   Karren Burly, MD 11/29/2022  I spent 41 minutes in the care of the patient including review of records, face to face visit,  coordination of care.

## 2022-11-29 NOTE — Addendum Note (Signed)
Addended by: Maurene Capes on: 11/29/2022 11:07 AM   Modules accepted: Orders

## 2022-11-29 NOTE — Patient Instructions (Addendum)
Nice to see you again  I sent a refill for prednisone  We will do paperwork for the [facility] for the next year  We will do paperwork for Dupixent  Return to clinic in 3 months or sooner as needed

## 2022-11-30 ENCOUNTER — Encounter: Payer: Self-pay | Admitting: *Deleted

## 2022-11-30 ENCOUNTER — Other Ambulatory Visit: Payer: Self-pay | Admitting: *Deleted

## 2022-11-30 DIAGNOSIS — R079 Chest pain, unspecified: Secondary | ICD-10-CM

## 2022-11-30 LAB — BASIC METABOLIC PANEL
BUN/Creatinine Ratio: 11 (ref 10–24)
BUN: 14 mg/dL (ref 8–27)
CO2: 28 mmol/L (ref 20–29)
Calcium: 10 mg/dL (ref 8.6–10.2)
Chloride: 98 mmol/L (ref 96–106)
Creatinine, Ser: 1.33 mg/dL — ABNORMAL HIGH (ref 0.76–1.27)
Glucose: 136 mg/dL — ABNORMAL HIGH (ref 70–99)
Potassium: 4.6 mmol/L (ref 3.5–5.2)
Sodium: 139 mmol/L (ref 134–144)
eGFR: 59 mL/min/{1.73_m2} — ABNORMAL LOW (ref >59)

## 2022-11-30 LAB — MAGNESIUM: Magnesium: 2 mg/dL (ref 1.6–2.3)

## 2022-11-30 MED ORDER — METOPROLOL TARTRATE 100 MG PO TABS
ORAL_TABLET | ORAL | 0 refills | Status: AC
Start: 2022-11-30 — End: ?

## 2022-12-01 ENCOUNTER — Telehealth: Payer: Medicare HMO | Admitting: Pharmacist

## 2022-12-01 DIAGNOSIS — J4550 Severe persistent asthma, uncomplicated: Secondary | ICD-10-CM

## 2022-12-01 NOTE — Telephone Encounter (Signed)
Received new start paperwork for Dupixent (placed in PAP pending info folder in pharmacy office)  Submitted a Prior Authorization request to [facility] for DUPIXENT via CoverMyMeds. Will update once we receive a response.  Key: Ronnald Nian, PharmD, MPH, BCPS, CPP Clinical Pharmacist (Rheumatology and Pulmonology)

## 2022-12-02 ENCOUNTER — Other Ambulatory Visit (HOSPITAL_COMMUNITY): Payer: Self-pay

## 2022-12-02 NOTE — Telephone Encounter (Signed)
Received notification from [facility] regarding a prior authorization for DUPIXENT. Authorization has been APPROVED from 07/15/2022 to 05/16/2023. Approval letter sent to scan center.  Per test claim, copay for 28 days supply is $1653.91  Patient can fill through [facility]: [phone]   Will need to submit [employer] pt assistance application  Chesley Mires, PharmD, MPH, BCPS, CPP Clinical Pharmacist (Rheumatology and Pulmonology)

## 2022-12-04 ENCOUNTER — Other Ambulatory Visit: Payer: Self-pay | Admitting: Student

## 2022-12-05 ENCOUNTER — Telehealth: Payer: Self-pay | Admitting: Pulmonary Disease

## 2022-12-05 NOTE — Telephone Encounter (Signed)
Submitted Patient Assistance Application to [employer] for DUPIXENT along with provider portion, pt portion, med list, insurance card copy, PA. Will update patient when we receive a response.  Phone #: [phone] Fax #: [phone]  ATC patient to discuss that he will need to call [employer] to express financial hardship. Unable to reach. Left VM requesting return call  Chesley Mires, PharmD, MPH, BCPS, CPP Clinical Pharmacist (Rheumatology and Pulmonology)

## 2022-12-05 NOTE — Telephone Encounter (Signed)
I have cancelled this appt.

## 2022-12-07 NOTE — Telephone Encounter (Signed)
Spoke with patient regarding [employer] PAP enrollment and next steps. He will call [employer] tomorrow to express financial hardship with affording medication. Pharmacy team will continue to f/u  Chesley Mires, PharmD, MPH, BCPS, CPP Clinical Pharmacist (Rheumatology and Pulmonology)

## 2022-12-13 ENCOUNTER — Encounter: Payer: Self-pay | Admitting: Cardiology

## 2022-12-13 MED ORDER — DUPIXENT 300 MG/2ML ~~LOC~~ SOAJ
SUBCUTANEOUS | 0 refills | Status: DC
Start: 2022-12-13 — End: 2022-12-15

## 2022-12-13 MED ORDER — METOPROLOL SUCCINATE ER 100 MG PO TB24: 100.0000 mg | ORAL_TABLET | Freq: Every day | ORAL | 3 refills | Status: DC

## 2022-12-13 NOTE — Telephone Encounter (Signed)
[employer] send only thing they need is a new rx. New rx has been sent.

## 2022-12-13 NOTE — Telephone Encounter (Signed)
Patient states has been approved for Dupixent. Patient phone number is [phone].

## 2022-12-14 NOTE — Telephone Encounter (Signed)
Patient scheduled for Dupixent new start on 12/15/2022

## 2022-12-14 NOTE — Telephone Encounter (Signed)
Yes that is fine to change to toprol XL

## 2022-12-14 NOTE — Telephone Encounter (Signed)
Received a fax from  [employer] regarding an approval for DUPIXENT patient assistance from 12/14/2022 to 05/16/2023. Approval letter sent to scan center. Pt's Ref# is: [id number]  Phone #: [phone] Fax #: [phone]

## 2022-12-14 NOTE — Progress Notes (Unsigned)
HPI Patient presents today to [facility] to see pharmacy team for Dupixent new start.  Past medical history includes *** Patient has severe persistent, oral-corticosteroid dependent asthma  Last seen by Dr. Judeth Horn on 11/29/22  Respiratory Medications Current regimen: *** Tried in past: *** Patient reports {Adherence challenges yes no:[id number]::"adherence challenges","no known adherence challenges"}  OBJECTIVE Allergies  Allergen Reactions   Amoxicillin Anaphylaxis    Has patient had a PCN reaction causing immediate rash, facial/tongue/throat swelling, SOB or lightheadedness with hypotension: Yes Has patient had a PCN reaction causing severe rash involving mucus membranes or skin necrosis: No Has patient had a PCN reaction that required hospitalization: Yes Has patient had a PCN reaction occurring within the last 10 years: Yes If all of the above answers are "NO", then may proceed with Cephalosporin use.    Lisinopril Shortness Of Breath   Molds & Smuts Anaphylaxis   Robitussin [Guaifenesin] Shortness Of Breath    wheezing   Azithromycin Other (See Comments)    Outpatient Encounter Medications as of 12/15/2022  Medication Sig Note   acetaminophen (TYLENOL) 325 MG tablet Take 2 tablets (650 mg total) by mouth every 4 (four) hours as needed for headache or mild pain.    albuterol (PROVENTIL) (2.5 MG/3ML) 0.083% nebulizer solution TAKE 3 MLS BY NEBULIZATION EVERY 4 HOURS AS NEEDED FOR WHEEZING OR SHORTNESS OF BREATH (((PLAN B)))    albuterol (VENTOLIN HFA) 108 (90 Base) MCG/ACT inhaler Inhale 2 puffs into the lungs every 4 hours as needed for shortness of breath or wheezing    amLODipine (NORVASC) 5 MG tablet Take 1 tablet (5 mg total) by mouth daily. (Patient taking differently: Take 5 mg by mouth in the morning and at bedtime.)    ASPIRIN 81 PO Take 81 mg by mouth daily.    Blood Glucose Monitoring Suppl (TRUE METRIX METER) w/Device KIT Use as directed    busPIRone  (BUSPAR) 10 MG tablet Take 1 tablet (10 mg total) by mouth 3 (three) times daily. (Patient taking differently: Take 10 mg by mouth 3 (three) times daily as needed (anxiety).)    Cholecalciferol (VITAMIN D PO) Take 1 tablet by mouth daily.    diazepam (VALIUM) 10 MG tablet Take 1 tablet (10 mg total) by mouth 60 (sixty) minutes before procedure for 1 dose. Take with a sip of water, on an empty stomach.    Dupilumab (DUPIXENT) 300 MG/2ML SOPN Inject 600mg  into the skin at Day 0 then 300mg  into the skin every 14 days    ezetimibe (ZETIA) 10 MG tablet Take 1 tablet (10 mg total) by mouth daily.    furosemide (LASIX) 20 MG tablet TAKE 1 TABLET FOR WEIGHT GAIN OF 3LBS PER NIGHT OR 5LBS IN ONE WEEK    gabapentin (NEURONTIN) 300 MG capsule take 1 capsule by mouth in the morning, then take 1 at lunch and 2 at bedtime (Patient taking differently: Take 300 mg by mouth 3 (three) times daily as needed (Nerve pain).) 05/05/2021: Patient takes as needed   glucose blood (TRUE METRIX BLOOD GLUCOSE TEST) test strip Use as instructed    ipratropium (ATROVENT) 0.06 % nasal spray PLACE 2 SPRAYS INTO BOTH NOSTRILS 3 (THREE) TIMES DAILY. AS NEEDED FOR NASAL CONGESTION, RUNNY NOSE    ipratropium-albuterol (DUONEB) 0.5-2.5 (3) MG/3ML SOLN INHALE 1 VIAL VIA NEBULIZER TWICE A DAY    lidocaine (LIDODERM) 5 % Apply 1 patch to affected area daily and leave on for 12 hours at a time.  Remove after 12  hours    linaclotide (LINZESS) 72 MCG capsule Take 1 capsule (72 mcg total) by mouth daily before breakfast.    metFORMIN (GLUCOPHAGE-XR) 500 MG 24 hr tablet Take 2 tablets (1000 mg total) by mouth in the morning and 1 tablet (500 mg total) by mouth in the evening with meals.    metoprolol succinate (TOPROL-XL) 100 MG 24 hr tablet Take 1 tablet (100 mg total) by mouth daily. Take with or immediately following a meal.    metoprolol tartrate (LOPRESSOR) 100 MG tablet Take 2 hours prior to CT scan    montelukast (SINGULAIR) 10 MG tablet  Take 1 tablet (10 mg total) by mouth at bedtime.    Nebivolol HCl 20 MG TABS Take 1 tablet (20 mg total) by mouth 2 (two) times daily.    pantoprazole (PROTONIX) 40 MG tablet Take 1 tablet (40 mg total) by mouth daily as needed.    potassium chloride SA (KLOR-CON M) 20 MEQ tablet Take 1 tablet (20 mEq total) by mouth daily as needed (When hands cramp up).    predniSONE (DELTASONE) 10 MG tablet Take 3 tablets (30 mg total) by mouth daily as needed.    Respiratory Therapy Supplies (FLUTTER) DEVI Use three times a day after inhaler or nebulizer use    rosuvastatin (CRESTOR) 20 MG tablet Take 1 tablet (20 mg total) by mouth daily.    silver sulfADIAZINE (SILVADENE) 1 % cream Apply twice a day to affected area    traMADol (ULTRAM) 50 MG tablet Take 1 tablet (50 mg total) by mouth every evening. Each prescription to last 1 mth    TRUEplus Lancets 28G MISC Use as directed    valsartan (DIOVAN) 320 MG tablet Take 1 tablet (320 mg total) by mouth daily.    No facility-administered encounter medications on file as of 12/15/2022.     Immunization History  Administered Date(s) Administered   DTaP 03/16/2012   Influenza Split 02/07/2013, 02/14/2015, 01/21/2016   Influenza Whole 04/15/2012, 02/10/2020   Influenza, High Dose Seasonal PF 01/28/2019, 12/23/2021   Influenza, Seasonal, Injecte, Preservative Fre 02/07/2013, 02/14/2015, 01/21/2016, 01/27/2017   Influenza,inj,Quad PF,6+ Mos 01/28/2019   Influenza,inj,Quad PF,6-35 Mos 02/08/2021   Influenza,trivalent, recombinat, inj, PF 02/07/2013, 02/14/2015   Influenza-Unspecified 02/07/2013, 02/07/2013, 02/14/2015, 02/14/2015, 01/21/2016, 01/21/2016, 12/22/2016, 12/22/2016, 01/27/2017, 01/27/2017   PFIZER(Purple Top)SARS-COV-2 Vaccination 07/29/2019, 08/19/2019, 01/07/2020, 03/17/2021   PNEUMOCOCCAL CONJUGATE-20 12/08/2021   Pneumococcal Conjugate-13 03/16/2015   Pneumococcal Polysaccharide-23 03/16/2012, 05/16/2013   Tdap 12/19/2016   Zoster  Recombinant(Shingrix) 12/19/2016, 02/27/2017     PFTs    Latest Ref Rng & Units 05/26/2021    8:14 AM 02/28/2020   10:53 AM  PFT Results  FVC-Pre L 2.31  2.06   FVC-Predicted Pre % 59  52   FVC-Post L 2.28  2.30   FVC-Predicted Post % 59  59   Pre FEV1/FVC % % 73  70   Post FEV1/FCV % % 72  74   FEV1-Pre L 1.69  1.43   FEV1-Predicted Pre % 56  47   FEV1-Post L 1.65  1.70   DLCO uncorrected ml/min/mmHg 19.07  21.37   DLCO UNC% % 71  80   DLCO corrected ml/min/mmHg 19.07  22.15   DLCO COR %Predicted % 71  82   DLVA Predicted % 99  123      Eosinophils Most recent blood eosinophil count was *** cells/microL taken on ***.   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 Response to therapy: may take 4 months to determine efficacy. Discussed that patients generally feel improvement sooner than 4 months.  Side effects: injection site reaction (6-18%), antibody development (5-16%), ophthalmic conjunctivitis (2-16%), transient blood eosinophilia (1-2%)  Dose: {dupixentdose:25708}  Administration/Storage:  Reviewed administration sites of thigh or abdomen (at least 2-3 inches away from abdomen). Reviewed the upper arm is only appropriate if caregiver is administering injection  Do not shake pen/syringe as this could lead to product foaming or precipitation. Do not use if solution is discolored or contains particulate matter or if window on prefilled pen is yellow (indicates pen has been used).  Reviewed storage of medication in refrigerator. Reviewed that Dupixent can be stored at room temperature in unopened carton for up to 14  days.  Access: Approval of Dupixent through: patient assistance  Patient self-administered Dupixent 300mg /36ml x 2 (total dose 600mg ) in {injsitedsg:28167} and {injsitedsg:28167} using sample Dupixent 300mg /66mL autoinjector pen NDC: *** Lot: *** Expiration: ***  Patient monitored for 30 minutes for adverse reaction.  Patient tolerated ***. Injection site checked and no ***.  Medication Reconciliation  A drug regimen assessment was performed, including review of allergies, interactions, disease-state management, dosing and immunization history. Medications were reviewed with the patient, including name, instructions, indication, goals of therapy, potential side effects, importance of adherence, and safe use.  Drug interaction(s): ***  Immunizations  Patient is indicated for the influenzae, pneumonia, and shingles vaccinations. Patient has received *** COVID19 vaccines.   PLAN Continue Dupixent 300mg  every 14 days.  Next dose is due 12/29/22 and every 14 days thereafter. Rx sent to: [facility]: [phone].  Patient provided with pharmacy phone number and advised to call later this week to schedule shipment to home.  Continue maintenance asthma regimen of: ***  All questions encouraged and answered.  Instructed patient to reach out with any further questions or concerns.  Thank you for allowing pharmacy to participate in this patient's care.  This appointment required *** minutes of patient care (this includes precharting, chart review, review of results, face-to-face care, etc.).

## 2022-12-15 ENCOUNTER — Ambulatory Visit: Payer: Self-pay | Admitting: Pharmacist

## 2022-12-15 DIAGNOSIS — Z7189 Other specified counseling: Secondary | ICD-10-CM

## 2022-12-15 DIAGNOSIS — J4550 Severe persistent asthma, uncomplicated: Secondary | ICD-10-CM

## 2022-12-15 MED ORDER — BREZTRI AEROSPHERE 160-9-4.8 MCG/ACT IN AERO
2.0000 | INHALATION_SPRAY | Freq: Two times a day (BID) | RESPIRATORY_TRACT | 5 refills | Status: DC
Start: 2022-12-15 — End: 2023-02-20

## 2022-12-15 MED ORDER — DUPIXENT 300 MG/2ML ~~LOC~~ SOAJ
300.0000 mg | SUBCUTANEOUS | 1 refills | Status: DC
Start: 2022-12-15 — End: 2023-05-03

## 2022-12-15 NOTE — Patient Instructions (Signed)
Your next DUPIXENT dose is due on 12/29/22, 01/12/23, and every 14 days thereafter  CONTINUE Breztri 2 puffs twice daily, montelukast 10mg  nightly.  Your prescription will be shipped from [facility]. Their phone number is [phone] Please call to schedule shipment and confirm address. They will mail your medication to your home.  You will need to be seen by your provider in 3 to 4 months to assess how DUPIXENT is working for you. Please ensure you have a follow-up appointment scheduled in November or December 2024. Call our clinic if you need to make this appointment.  How to manage an injection site reaction: Remember the 5 C's: COUNTER - leave on the counter at least 30 minutes but up to overnight to bring medication to room temperature. This may help prevent stinging COLD - place something cold (like an ice gel pack or cold water bottle) on the injection site just before cleansing with alcohol. This may help reduce pain CLARITIN - use Claritin (generic name is loratadine) for the first two weeks of treatment or the day of, the day before, and the day after injecting. This will help to minimize injection site reactions CORTISONE CREAM - apply if injection site is irritated and itching CALL ME - if injection site reaction is bigger than the size of your fist, looks infected, blisters, or if you develop hives

## 2022-12-16 ENCOUNTER — Other Ambulatory Visit: Payer: Self-pay | Admitting: *Deleted

## 2022-12-16 DIAGNOSIS — Z87891 Personal history of nicotine dependence: Secondary | ICD-10-CM

## 2022-12-16 DIAGNOSIS — F17210 Nicotine dependence, cigarettes, uncomplicated: Secondary | ICD-10-CM

## 2022-12-16 DIAGNOSIS — Z122 Encounter for screening for malignant neoplasm of respiratory organs: Secondary | ICD-10-CM

## 2022-12-19 ENCOUNTER — Other Ambulatory Visit: Payer: Medicare HMO

## 2022-12-20 ENCOUNTER — Other Ambulatory Visit: Payer: Self-pay | Admitting: Cardiology

## 2022-12-26 ENCOUNTER — Ambulatory Visit (HOSPITAL_COMMUNITY): Payer: Medicare HMO

## 2022-12-30 ENCOUNTER — Encounter: Payer: Self-pay | Admitting: Cardiology

## 2022-12-30 NOTE — Telephone Encounter (Signed)
Patient states saw PCP.  Swelling to feet and ankles, comes and goes.  Not going upward to legs. Increased for couple weeks.  Watches sodium intake, hydrating well due to heat.  Increased SOB "sometimes when I do something or if I lay a comforter on my chest"  At that time will lay on his side.  SOB if carries anything but states that happened before and  about the same, and uses nebulizer.  Last OV 7/16 246  States was 242-243 before and after office visit.  Today weight 247.8 States feels it in his stomach and "feels fat fat" as fluid is in stomach.  He states when on the Lasix 20 mg doesn't feel like it pulls off but when using the 40 mg of Lasix he feels like it pulls more off.  He took 40 mg for approx 3 times over several weeks.   And then weight and fluid went down. He states taking the 20 mg every other day or every other 2 days. For the last week.   Very difficult to get answers or exact information.  Does state last dose of Lasix was day before yesterday.   Please advise what dose he should take and how often and other recommendations

## 2023-01-01 NOTE — Telephone Encounter (Signed)
Recommend monitoring daily weights and can take Lasix 40 mg if gains more than 3 pounds in 1 day or 5 pounds in 1 week

## 2023-01-02 ENCOUNTER — Other Ambulatory Visit: Payer: Self-pay

## 2023-01-02 MED ORDER — FUROSEMIDE 40 MG PO TABS: ORAL_TABLET | ORAL | 3 refills | Status: DC

## 2023-01-02 NOTE — Telephone Encounter (Signed)
Spoke to patient Dr.Schumann's advice given.Stated he would like to see Dr.Schumann.Appointment scheduled 8/27 at 8:20 am.Advised to bring list of weights.

## 2023-01-07 IMAGING — DX DG CHEST 2V
2 series · 2 of 2 positions shown · non-contrast
Comparison: X-ray chest 01/24/2021.

CLINICAL DATA: Chest pain.

EXAM:
CHEST - 2 VIEW

[chest pa]
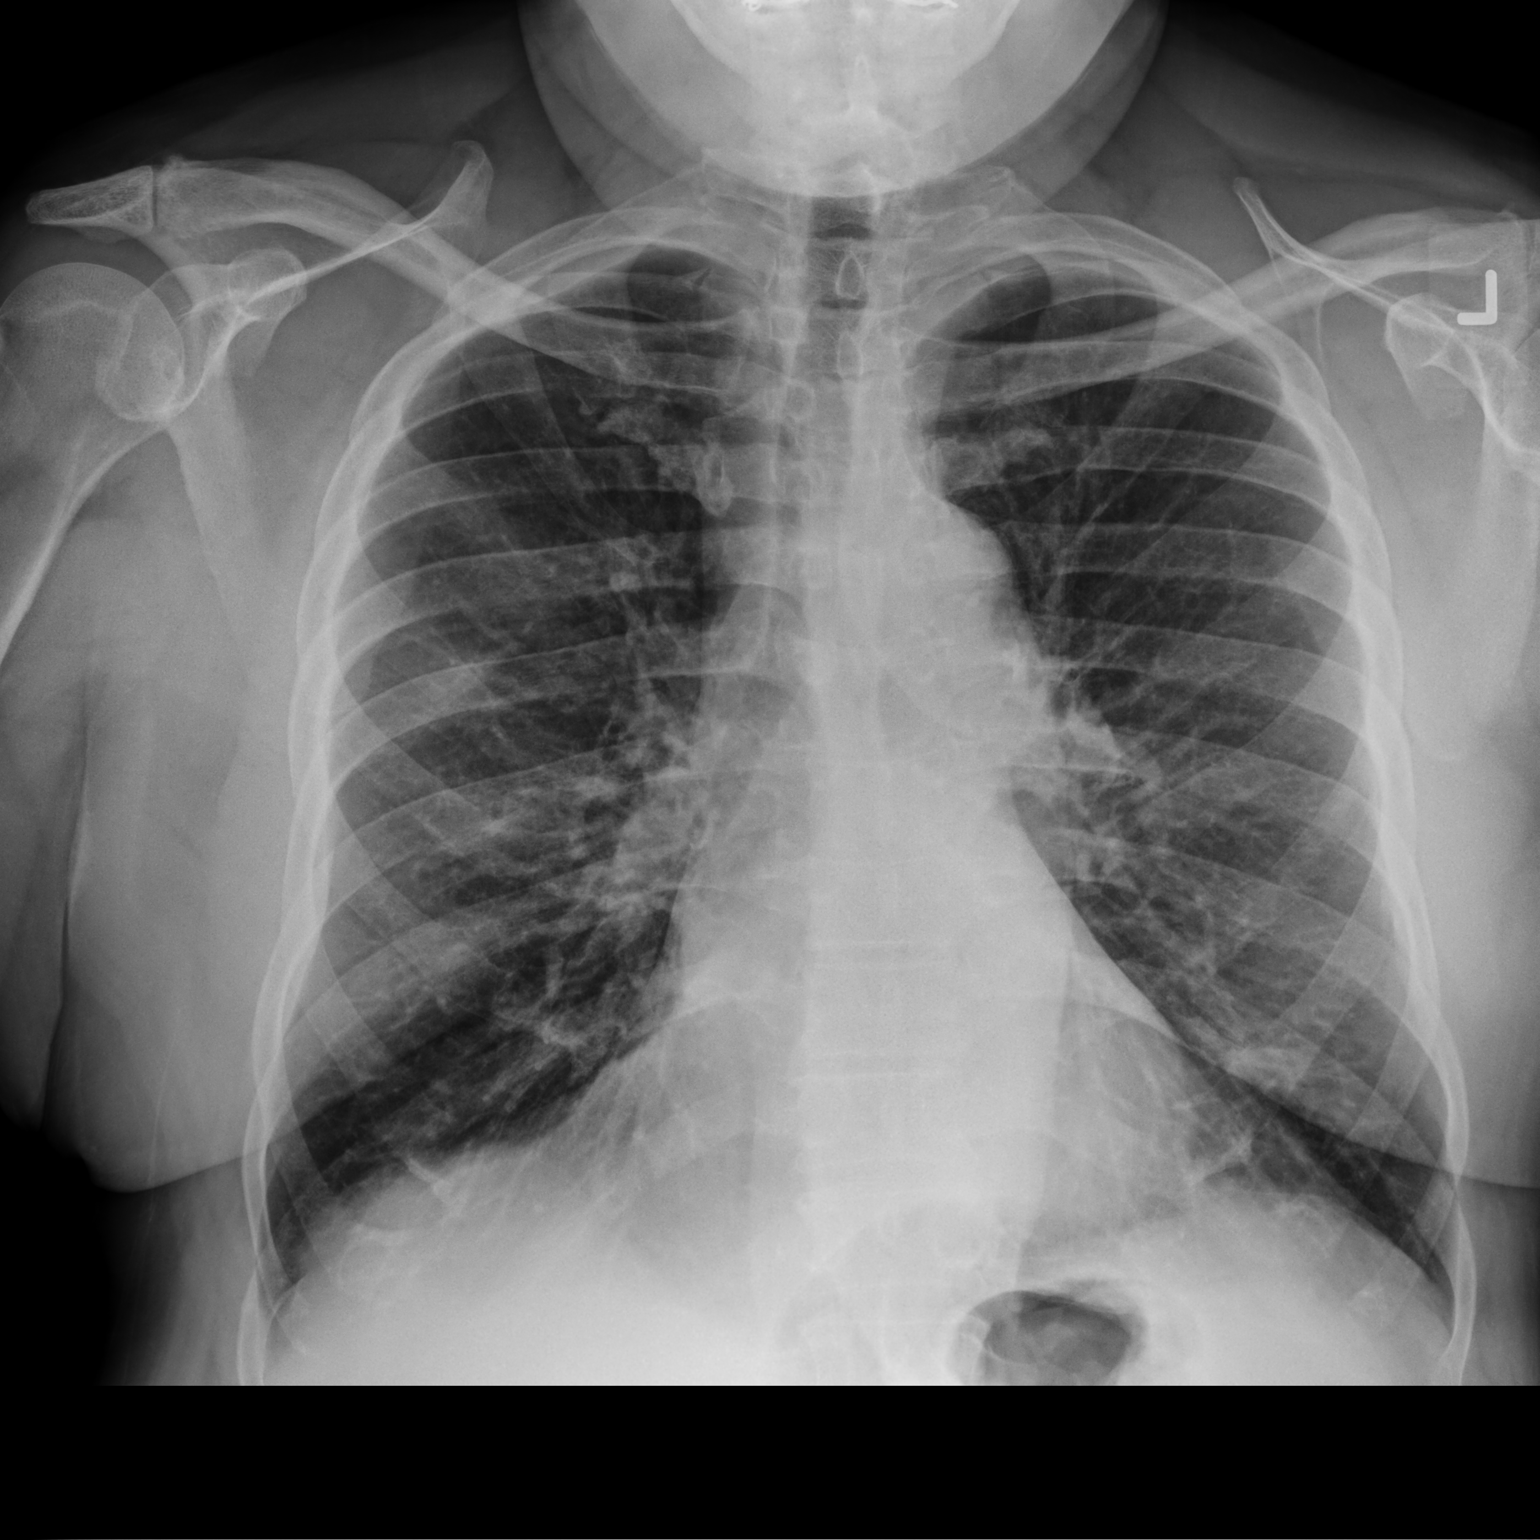

[chest lat]
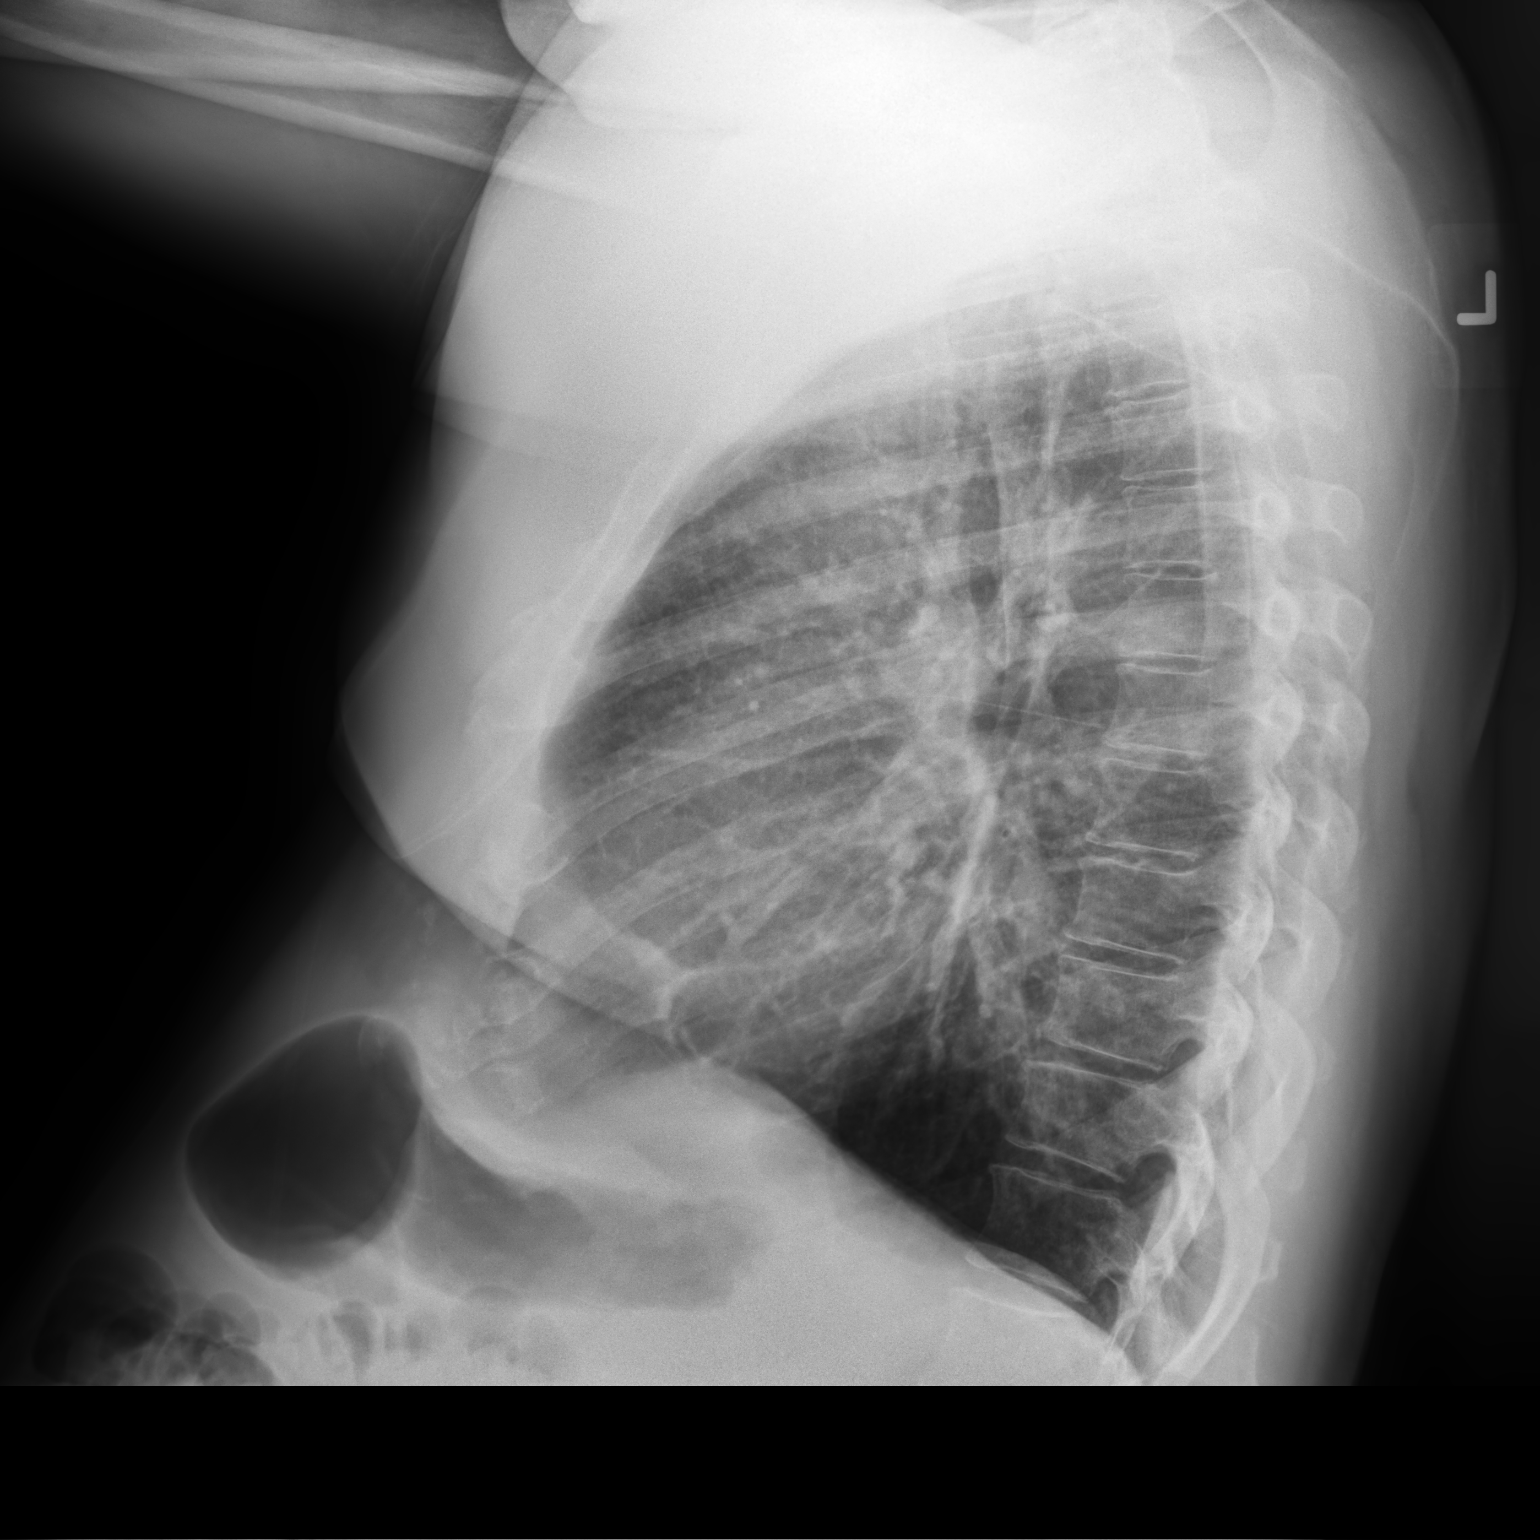

[2 of 2 positions shown; findings below may reference images not displayed]

FINDINGS: The cardiomediastinal silhouette is unchanged with normal heart
size. The lungs are hyperinflated. Minimal new right mid lung and
bibasilar opacities are present. No pleural effusion or pneumothorax
is identified. No acute osseous abnormality is seen.
IMPRESSION: Minimal right mid lung and bibasilar opacities which could reflect
atelectasis or early/atypical infection.

## 2023-01-08 NOTE — Progress Notes (Unsigned)
Cardiology Office Note:    Date:  01/12/2023   ID:  Joseph Harvey, DOB 14-Dec-1955, MRN [id number]  PCP:  Joseph Martes, NP  Cardiologist:  Joseph Ishikawa, MD  Electrophysiologist:  Joseph Lemming, MD   Referring MD: Joseph Martes, NP   Chief Complaint  Patient presents with   Cardiomyopathy    History of Present Illness:    Joseph Harvey is a 67 y.o. male with a hx of prostate cancer in remission, T2DM, hypertension, hyperlipidemia, toabbaco use who presents for follow-up.  He was referred by Dr. Laural Harvey for evaluation of dyspnea on exertion, intially seen on 04/14/20.  Hospital he reports that he has been having chest pain with minimal exertion.  Also describes having chest pain.  Reports sharp left-sided chest pain, can occur at rest or with exertion.  Typically last for 10 minutes or so.  Also states he has been having occasional lightheadedness, denies any syncope.  Denies any lower extremity edema.  Reports BP has been anywhere from 110s to 160s when he checks at home.  Reports he had a prior test for sleep apnea which was negative.  He has smoked since age 77, less than 1 pack/day.  No known history of heart disease in his immediate family.  Echocardiogram on 03/18/2020 showed severe septal hypertrophy with otherwise mild concentric hypertrophy, mild intracavitary gradient (peak gradient 30 mmHg), normal biventricular function, grade 1 diastolic dysfunction, no significant valvular disease.  Lexiscan Myoview on 04/29/2020 showed normal perfusion, EF 57%.  Preventives monitor x3 days on 05/06/2020 showed 7 episodes of NSVT, longest lasting 3 beats, occasional PVCs (3.9% of beats).  Calcium score 117 on 04/29/2020 (81st percentile).  Cardiac MRI 06/23/2020 showed asymmetric hypertrophy measuring up to 20 mm in basal anterior septum (8 mm and posterior wall), consistent with hypertrophic cardiomyopathy.  Also with patchy LGE in basal septum and RV insertion sites consistent with HCM;  LGE accounts for 11% of total myocardial mass.  Normal LV/RV size and systolic function.  Echo on 04/14/2021 showed EF of 60 to 65%, severe asymmetric hypertrophy of basal septum, no LVOT obstruction, grade 1 diastolic dysfunction, normal RV function, no significant valvular disease.  Referred to EP for evaluation, underwent PVC ablation with Dr. Elberta Fortis 08/10/2021.  Repeat Zio patch x3 days on 10/07/2021 showed PVCs represent 1.6% of beats.  Echocardiogram 08/2022 showed hyperdynamic LV function with near mid cavity obliteration during systole with gradient up to 32 mmHg with Valsalva.  Since last clinic visit, he reports he is doing okay.  Has been taking Lasix 40 mg about twice per week, states is keeping fluid off.  Weight is down 5 pounds.  Continues to have intermittent chest pain that he describes as burning on far left side of chest.  Not related to exertion.  Continues to smoke about 10 cigarettes/day.  Wt Readings from Last 3 Encounters:  01/10/23 240 lb 9.6 oz (109.1 kg)  11/29/22 244 lb (110.7 kg)  11/29/22 246 lb (111.6 kg)     BP Readings from Last 3 Encounters:  01/10/23 108/60  11/29/22 118/74  11/29/22 128/60    Wt Readings from Last 3 Encounters:  01/10/23 240 lb 9.6 oz (109.1 kg)  11/29/22 244 lb (110.7 kg)  11/29/22 246 lb (111.6 kg)     Past Medical History:  Diagnosis Date   Allergy    Dust, mold, dust mites   Anemia    Asthma    Cancer (HCC)    prostate   Cataract  bilateral repair.   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   Pneumonia     Past Surgical History:  Procedure Laterality Date   BACK SURGERY  2017   CATARACT EXTRACTION Bilateral    COLONOSCOPY     2013   COLONOSCOPY  10/03/2019   KNEE ARTHROSCOPY  2005   right   PROSTATE SURGERY     PVC ABLATION N/A 08/10/2021   Procedure: PVC ABLATION;  Surgeon: Joseph Lemming, MD;  Location: [facility];  Service: Cardiovascular;  Laterality: N/A;   ROBOT ASSISTED LAPAROSCOPIC RADICAL PROSTATECTOMY  03/2010    Current Medications: Current Meds  Medication Sig   acetaminophen (TYLENOL) 325 MG tablet Take 2 tablets (650 mg total) by mouth every 4 (four) hours as needed for headache or mild pain.   albuterol (PROVENTIL) (2.5 MG/3ML) 0.083% nebulizer solution TAKE 3 MLS BY NEBULIZATION EVERY 4 HOURS AS NEEDED FOR WHEEZING OR SHORTNESS OF BREATH (((PLAN B)))   amLODipine (NORVASC) 5 MG tablet Take 1 tablet (5 mg total) by mouth daily. (Patient taking differently: Take 5 mg by mouth in the morning and at bedtime.)   ASPIRIN 81 PO Take 81 mg by mouth daily.   Blood Glucose Monitoring Suppl (TRUE METRIX METER) w/Device KIT Use as directed   Budeson-Glycopyrrol-Formoterol (BREZTRI AEROSPHERE) 160-9-4.8 MCG/ACT AERO Inhale 2 puffs into the lungs in the morning and at bedtime.   busPIRone (BUSPAR) 10 MG tablet Take 1 tablet (10 mg total) by mouth 3 (three) times daily. (Patient taking differently: Take 10 mg by mouth 3 (three) times daily as needed (anxiety).)   Cholecalciferol (VITAMIN D PO) Take 1 tablet by mouth daily.   Dupilumab (DUPIXENT) 300 MG/2ML SOPN Inject 300 mg into the skin every 14 (fourteen) days. **loading dose completed in clinic on 12/15/22**   furosemide (LASIX) 40 MG tablet Take 40 mg daily if gains 3 lbs overnight or 5 lbs in 1 week   gabapentin (NEURONTIN) 300 MG capsule take 1 capsule by mouth in the morning, then take 1 at lunch and 2 at bedtime (Patient taking differently: Take 300 mg by mouth 3 (three) times daily as needed (Nerve pain).)   glucose blood (TRUE METRIX BLOOD GLUCOSE TEST) test strip Use as instructed   ipratropium (ATROVENT) 0.06 % nasal spray PLACE 2 SPRAYS INTO BOTH NOSTRILS 3 (THREE) TIMES DAILY. AS NEEDED FOR NASAL CONGESTION, RUNNY NOSE   ipratropium-albuterol (DUONEB) 0.5-2.5 (3) MG/3ML SOLN INHALE 1 VIAL VIA  NEBULIZER TWICE A DAY   lidocaine (LIDODERM) 5 % Apply 1 patch to affected area daily and leave on for 12 hours at a time.  Remove after 12 hours   linaclotide (LINZESS) 72 MCG capsule Take 1 capsule (72 mcg total) by mouth daily before breakfast.   metFORMIN (GLUCOPHAGE-XR) 500 MG 24 hr tablet Take 2 tablets (1000 mg total) by mouth in the morning and 1 tablet (500 mg total) by mouth in the evening with meals.   metoprolol succinate (TOPROL-XL) 100 MG 24 hr tablet Take 1 tablet (100 mg total) by mouth daily. Take with or immediately following a meal.   montelukast (SINGULAIR) 10 MG tablet Take 1 tablet (10 mg total) by mouth at bedtime.   pantoprazole (PROTONIX) 40 MG tablet Take 1 tablet (40 mg total) by mouth daily as needed.   potassium  chloride SA (KLOR-CON M) 20 MEQ tablet Take 1 tablet (20 mEq total) by mouth daily as needed (When hands cramp up).   predniSONE (DELTASONE) 10 MG tablet Take 3 tablets (30 mg total) by mouth daily as needed.   Respiratory Therapy Supplies (FLUTTER) DEVI Use three times a day after inhaler or nebulizer use   rosuvastatin (CRESTOR) 20 MG tablet Take 1 tablet (20 mg total) by mouth daily.   Semaglutide (RYBELSUS) 7 MG TABS Take 1 tablet by mouth daily.   silver sulfADIAZINE (SILVADENE) 1 % cream Apply twice a day to affected area   traMADol (ULTRAM) 50 MG tablet Take 1 tablet (50 mg total) by mouth every evening. Each prescription to last 1 mth   TRUEplus Lancets 28G MISC Use as directed   valsartan (DIOVAN) 320 MG tablet Take 1 tablet (320 mg total) by mouth daily.   [DISCONTINUED] albuterol (VENTOLIN HFA) 108 (90 Base) MCG/ACT inhaler Inhale 2 puffs into the lungs every 4 hours as needed for shortness of breath or wheezing   [DISCONTINUED] Nebivolol HCl 20 MG TABS Take 1 tablet (20 mg total) by mouth 2 (two) times daily.     Allergies:   Amoxicillin, Lisinopril, Molds & smuts, Robitussin [guaifenesin], and Azithromycin   Social History   Socioeconomic  History   Marital status: Single    Spouse name: Not on file   Number of children: 2   Years of education: 12 grade   Highest education level: Not on file  Occupational History   Occupation: Facilities manager: [employer] ([employer]  Tobacco Use   Smoking status: Every Day    Current packs/day: 0.75    Average packs/day: 0.8 packs/day for 48.7 years (36.5 ttl pk-yrs)    Types: Cigarettes    Start date: 1976   Smokeless tobacco: Never   Tobacco comments:    still smoking 0.5 ppd  Vaping Use   Vaping status: Never Used  Substance and Sexual Activity   Alcohol use: Not Currently    Alcohol/week: 3.0 standard drinks of alcohol    Types: 3 Cans of beer per week   Drug use: No   Sexual activity: Not on file  Other Topics Concern   Not on file  Social History Narrative   Not on file   Social Determinants of Health   Financial Resource Strain: Not on file  Food Insecurity: Not on file  Transportation Needs: Not on file  Physical Activity: Not on file  Stress: No Stress Concern Present (05/26/2020)   [employer] of Occupational Health - Occupational Stress Questionnaire    Feeling of Stress : Only a Joseph  Social Connections: Not on file     Family History: The patient's family history includes Allergies in his brother; Asthma in his mother; Colon cancer in his father; Diabetes in his maternal grandmother; Lung cancer in his father and mother; Prostate cancer in his father. There is no history of Colon polyps, Esophageal cancer, Stomach cancer, or Rectal cancer.  ROS:   Please see the history of present illness.     All other systems reviewed and are negative.  EKGs/Labs/Other Studies Reviewed:    The following studies were reviewed today:   EKG:   09/24/21: Normal sinus rhythm, no PVCs, poor R wave progression 05/05/21: Normal sinus rhythm, PVCs, Q waves in V1/2 04/14/20: normal sinus rhythm, PVCs, no ST abnormalities 05/18/20: EKG was not ordered. 07/14/20: EKG was not  ordered. 09/17/20: Sinus rhythm. Rate 96 bpm PVCs,  No ST abnormalities. 03/25/21: Normal sinus rhythm, frequent PVCs (trigeminy)  Recent Labs: 08/08/2022: ALT 27 09/23/2022: B Natriuretic Peptide 85.9; Hemoglobin 13.7; Platelets 302 01/10/2023: BUN 11; Creatinine, Ser 1.16; Magnesium 1.8; Potassium 4.1; Sodium 140  Recent Lipid Panel    Component Value Date/Time   CHOL 135 08/08/2022 0838   TRIG 242 (H) 08/08/2022 0838   HDL 59 08/08/2022 0838   CHOLHDL 2.3 08/08/2022 0838   LDLCALC 39 08/08/2022 0838    Physical Exam:    VS:  BP 108/60 (BP Location: Left Arm, Patient Position: Sitting, Cuff Size: Large)   Pulse 77   Ht 5\' 9"  (1.753 m)   Wt 240 lb 9.6 oz (109.1 kg)   SpO2 96%   BMI 35.53 kg/m     Wt Readings from Last 3 Encounters:  01/10/23 240 lb 9.6 oz (109.1 kg)  11/29/22 244 lb (110.7 kg)  11/29/22 246 lb (111.6 kg)     GEN:  Well nourished, well developed in no acute distress HEENT: Normal NECK: No JVD; No carotid bruits LYMPHATICS: No lymphadenopathy CARDIAC: RRR, no murmurs, rubs, gallops RESPIRATORY: Expiratory wheezing ABDOMEN: Soft, non-tender, non-distended MUSCULOSKELETAL: trace edema; No deformity  SKIN: Warm and dry NEUROLOGIC:  Alert and oriented x 3 PSYCHIATRIC:  Normal affect   ASSESSMENT:    1. Hypertrophic cardiomyopathy (HCC)   2. Essential hypertension   3. Hyperlipidemia LDL goal <70   4. Tobacco abuse     PLAN:    HCM: Cardiac MRI 06/23/2020 showed asymmetric hypertrophy measuring up to 20 mm and basal anterior septum (8 mm and posterior wall), consistent with hypertrophic cardiomyopathy.  Also with patchy LGE in basal septum and RV insertion sites consistent with HCM; LGE accounts for 11% of total myocardial mass.  Normal LV/RV size and systolic function.  Echocardiogram on 03/18/2020 showed mild LVOT obstruction, peak gradient 13 mmHg.  Echocardiogram 08/2022 showed hyperdynamic LV function with near mid cavity obliteration during systole with  gradient up to 32 mmHg with Valsalva. -Recommended first-degree relatives be screened for HCM: He reports he has talked to his daughters and brother/sisters.  Discussed genetic testing but he declined -Ideally try to avoid diuretics in HCM but he is volume overloaded on exam.  Started on HCTZ 25 mg daily, repeat echo on 08/2022 showed worsening LVOT obstruction as above, HCTZ dose reduced to 12.5 mg daily.  He is having recent lightheadedness with standing and reports low BP when checks at home, discontinued hydrochlorothiazide 11/2022.  Takes as needed Lasix if gains more than 3 pounds in 1 day or 5 pounds in 1 week -Continue Toprol-XL 100 mg daily -NSVT, frequent PVCs on Zio patch.  Referred to EP for evaluation, underwent PVC ablation with Dr Elberta Fortis on 08/10/21  Lower extremity edema: Bilateral swelling in feet/ankles.  Normal BNP, CMET 07/2022.  Suspect related to amlodipine use, dose reduced to 5 mg daily.  Trace edema on exam today, encouraged use of compression stockings.  He also takes as needed Lasix.  Chest pain/DOE: Chest pain atypical in description but does have CAD risk factors (hypertension, hyperlipidemia, T2DM).   Lexiscan Myoview on 04/29/2020 showed normal perfusion, EF 57%.  Calcium score 117 on 04/29/2020 (81st percentile). -He is reporting worsening chest pain recently, atypical in description.  Recommend coronary CTA for further evaluation.  This was ordered at prior clinic visit 11/2022 but reports he did not schedule yet.  Encouraged him to schedule coronary CTA.  Check BMET  PVCs: [employer] monitor x3 days on 05/06/2020 showed  7 episodes of NSVT, longest lasting 3 beats, occasional PVCs (3.9% of beats).  Zio patch x3 days on 04/06/2021 showed frequent PVCs (14.3% of beats), 1 episode of NSVT x4 beats -Referred to EP for evaluation, underwent PVC ablation with Dr. Elberta Fortis 08/10/2021.  Significantly improved, repeat Zio patch x3 days on 10/07/2021 showed PVCs represent 1.6% of  beats.  Leg pain: Normal ABIs on 04/23/2019  Hypertension: On amlodipine 5 mg daily, valsartan 320 mg, Toprol-XL 100 mg daily.  Appears controlled  Tobacco use: Patient counseled on the risk of tobacco use and cessation strongly encouraged.  Hyperlipidemia: On rosuvastatin 40 mg daily and Zetia 10 mg daily, LDL 53 on 03/25/2021.  Calcium score 117 on 04/29/2020 (81st percentile).  LDL 24 08/22/2021, rosuvastatin decreased to 20 mg daily.  LDL 39 on 08/08/2022  T2DM: On Metformin.  A1c 8% on 07/04/2022  OSA: Sleep study 10/2020 showed severe OSA, he is now on CPAP and reports compliance  RTC in 3 months   Medication Adjustments/Labs and Tests Ordered: Current medicines are reviewed at length with the patient today.  Concerns regarding medicines are outlined above.  Orders Placed This Encounter  Procedures   Basic metabolic panel   Magnesium    No orders of the defined types were placed in this encounter.    Patient Instructions  Medication Instructions:  The Nebivolol 20mg  has been removed from your medication list *If you need a refill on your cardiac medications before your next appointment, please call your pharmacy*   Lab Work: BMET, [facility] If you have labs (blood work) drawn today and your tests are completely normal, you will receive your results only by: MyChart Message (if you have MyChart) OR A paper copy in the mail If you have any lab test that is abnormal or we need to change your treatment, we will call you to review the results.   Follow-Up: At [facility], you and your health needs are our priority.  As part of our continuing mission to provide you with exceptional heart care, we have created designated Provider Care Teams.  These Care Teams include your primary Cardiologist (physician) and Advanced Practice Providers (APPs -  Physician Assistants and Nurse Practitioners) who all work together to provide you with the care you need, when you need  it.  We recommend signing up for the patient portal called "MyChart".  Sign up information is provided on this After Visit Summary.  MyChart is used to connect with patients for Virtual Visits (Telemedicine).  Patients are able to view lab/test results, encounter notes, upcoming appointments, etc.  Non-urgent messages can be sent to your provider as well.   To learn more about what you can do with MyChart, go to [email or web address].    Your next appointment:    Keep scheduled appointment in November   Provider:   [name], MD       Signed, Joseph Ishikawa, MD  01/12/2023 4:58 PM    [facility]

## 2023-01-10 ENCOUNTER — Other Ambulatory Visit: Payer: Self-pay | Admitting: Internal Medicine

## 2023-01-10 ENCOUNTER — Other Ambulatory Visit: Payer: Self-pay

## 2023-01-10 ENCOUNTER — Other Ambulatory Visit: Payer: Self-pay | Admitting: Cardiology

## 2023-01-10 ENCOUNTER — Ambulatory Visit: Payer: Medicare HMO | Attending: Cardiology | Admitting: Cardiology

## 2023-01-10 ENCOUNTER — Other Ambulatory Visit: Payer: Self-pay | Admitting: Critical Care Medicine

## 2023-01-10 ENCOUNTER — Encounter: Payer: Self-pay | Admitting: Cardiology

## 2023-01-10 VITALS — BP 108/60 | HR 77 | Ht 69.0 in | Wt 240.6 lb

## 2023-01-10 DIAGNOSIS — I422 Other hypertrophic cardiomyopathy: Secondary | ICD-10-CM

## 2023-01-10 DIAGNOSIS — E785 Hyperlipidemia, unspecified: Secondary | ICD-10-CM | POA: Diagnosis not present

## 2023-01-10 DIAGNOSIS — E1169 Type 2 diabetes mellitus with other specified complication: Secondary | ICD-10-CM

## 2023-01-10 DIAGNOSIS — Z720 Tobacco use: Secondary | ICD-10-CM | POA: Diagnosis not present

## 2023-01-10 DIAGNOSIS — I10 Essential (primary) hypertension: Secondary | ICD-10-CM

## 2023-01-10 NOTE — Patient Instructions (Signed)
Medication Instructions:  The Nebivolol 20mg  has been removed from your medication list *If you need a refill on your cardiac medications before your next appointment, please call your pharmacy*   Lab Work: BMET, [facility] If you have labs (blood work) drawn today and your tests are completely normal, you will receive your results only by: MyChart Message (if you have MyChart) OR A paper copy in the mail If you have any lab test that is abnormal or we need to change your treatment, we will call you to review the results.   Follow-Up: At [facility], you and your health needs are our priority.  As part of our continuing mission to provide you with exceptional heart care, we have created designated Provider Care Teams.  These Care Teams include your primary Cardiologist (physician) and Advanced Practice Providers (APPs -  Physician Assistants and Nurse Practitioners) who all work together to provide you with the care you need, when you need it.  We recommend signing up for the patient portal called "MyChart".  Sign up information is provided on this After Visit Summary.  MyChart is used to connect with patients for Virtual Visits (Telemedicine).  Patients are able to view lab/test results, encounter notes, upcoming appointments, etc.  Non-urgent messages can be sent to your provider as well.   To learn more about what you can do with MyChart, go to [email or web address].    Your next appointment:    Keep scheduled appointment in November   Provider:   [name], MD

## 2023-01-11 ENCOUNTER — Other Ambulatory Visit: Payer: Self-pay

## 2023-01-11 LAB — BASIC METABOLIC PANEL
BUN/Creatinine Ratio: 9 — ABNORMAL LOW (ref 10–24)
BUN: 11 mg/dL (ref 8–27)
CO2: 27 mmol/L (ref 20–29)
Calcium: 9.5 mg/dL (ref 8.6–10.2)
Chloride: 102 mmol/L (ref 96–106)
Creatinine, Ser: 1.16 mg/dL (ref 0.76–1.27)
Glucose: 90 mg/dL (ref 70–99)
Potassium: 4.1 mmol/L (ref 3.5–5.2)
Sodium: 140 mmol/L (ref 134–144)
eGFR: 69 mL/min/{1.73_m2} (ref >59)

## 2023-01-11 LAB — MAGNESIUM: Magnesium: 1.8 mg/dL (ref 1.6–2.3)

## 2023-01-11 MED ORDER — EZETIMIBE 10 MG PO TABS
10.0000 mg | ORAL_TABLET | Freq: Every day | ORAL | 3 refills | Status: DC
  Filled 2023-01-11: qty 90, 90d supply, fill #0
  Filled 2023-04-03: qty 90, 90d supply, fill #1
  Filled 2023-07-26: qty 90, 90d supply, fill #2

## 2023-01-11 MED ORDER — ALBUTEROL SULFATE HFA 108 (90 BASE) MCG/ACT IN AERS
2.0000 | INHALATION_SPRAY | RESPIRATORY_TRACT | 5 refills | Status: AC | PRN
Start: 2023-01-11 — End: ?
  Filled 2023-01-11: qty 18, 17d supply, fill #0
  Filled 2023-02-20: qty 18, 17d supply, fill #1
  Filled 2023-04-06: qty 18, 17d supply, fill #2

## 2023-01-12 ENCOUNTER — Other Ambulatory Visit: Payer: Self-pay

## 2023-01-13 ENCOUNTER — Other Ambulatory Visit: Payer: Self-pay | Admitting: Critical Care Medicine

## 2023-01-13 NOTE — Telephone Encounter (Signed)
Requested medication (s) are due for refill today: expired medication  Requested medication (s) are on the active medication list: yes   Last refill:  12/26/21 #60 2 refills  Future visit scheduled: no   Notes to clinic:  expired medication . Do you want to renew Rx?     Requested Prescriptions  Pending Prescriptions Disp Refills   potassium chloride SA (KLOR-CON M) 20 MEQ tablet 60 tablet 2    Sig: Take 1 tablet (20 mEq total) by mouth daily as needed (When hands cramp up).     Endocrinology:  Minerals - Potassium Supplementation Passed - 01/13/2023 12:38 PM      Passed - K in normal range and within 360 days    Potassium  Date Value Ref Range Status  01/10/2023 4.1 3.5 - 5.2 mmol/L Final         Passed - Cr in normal range and within 360 days    Creatinine  Date Value Ref Range Status  07/04/2022 105.3 20.0 - 300.0 mg/dL Final   Creatinine, Ser  Date Value Ref Range Status  01/10/2023 1.16 0.76 - 1.27 mg/dL Final         Passed - Valid encounter within last 12 months    Recent Outpatient Visits           6 months ago Type 2 diabetes mellitus with morbid obesity (HCC)   [place] [facility] Jonah Blue B, MD   10 months ago Type 2 diabetes mellitus with obesity [facility])   [place] [facility] Marcine Matar, MD   12 months ago Chronic maxillary sinusitis   [place] [facility] Marcine Matar, MD   1 year ago Lumbar radiculopathy   [place] [facility] Jonah Blue B, MD   1 year ago Severe persistent asthma with allergic rhinitis without complication   [place] [facility] Marcine Matar, MD       Future Appointments             In 2 months [name], MD [facility] at [facility]   In 2 months Hunsucker, Lesia Sago, MD [facility] at [facility]

## 2023-01-18 ENCOUNTER — Other Ambulatory Visit: Payer: Self-pay | Admitting: Cardiology

## 2023-01-18 ENCOUNTER — Other Ambulatory Visit: Payer: Self-pay

## 2023-01-18 ENCOUNTER — Other Ambulatory Visit: Payer: Self-pay | Admitting: Internal Medicine

## 2023-01-18 DIAGNOSIS — E1169 Type 2 diabetes mellitus with other specified complication: Secondary | ICD-10-CM

## 2023-01-19 ENCOUNTER — Other Ambulatory Visit: Payer: Self-pay

## 2023-01-23 NOTE — Progress Notes (Signed)
RN placed call to [facility] to review if PSMA PET had been ordered by Dr. Liliane Shi.  Vernona Rieger, RN will review with Dr. Liliane Shi to ensure imaging gets ordered.

## 2023-01-24 ENCOUNTER — Other Ambulatory Visit: Payer: Self-pay | Admitting: Critical Care Medicine

## 2023-01-24 ENCOUNTER — Other Ambulatory Visit: Payer: Self-pay | Admitting: Internal Medicine

## 2023-01-24 ENCOUNTER — Other Ambulatory Visit: Payer: Self-pay

## 2023-01-24 DIAGNOSIS — E1169 Type 2 diabetes mellitus with other specified complication: Secondary | ICD-10-CM

## 2023-01-24 MED ORDER — METFORMIN HCL ER 500 MG PO TB24
ORAL_TABLET | ORAL | 0 refills | Status: AC
  Filled 2023-01-24: qty 270, 90d supply, fill #0

## 2023-01-25 ENCOUNTER — Other Ambulatory Visit: Payer: Self-pay

## 2023-01-26 NOTE — Progress Notes (Signed)
PSMA PET has been ordered by Dr. Liliane Shi.  Pending authorization at this time.  Once scheduled, will work with admin to schedule consult.    RN left message with patient for update.

## 2023-02-01 ENCOUNTER — Other Ambulatory Visit (HOSPITAL_COMMUNITY): Payer: Self-pay | Admitting: Urology

## 2023-02-01 DIAGNOSIS — C61 Malignant neoplasm of prostate: Secondary | ICD-10-CM

## 2023-02-01 DIAGNOSIS — R9721 Rising PSA following treatment for malignant neoplasm of prostate: Secondary | ICD-10-CM

## 2023-02-02 ENCOUNTER — Telehealth: Payer: Self-pay | Admitting: Radiation Oncology

## 2023-02-02 NOTE — Telephone Encounter (Signed)
LVM to schedule CON with Dr. Kathrynn Running.

## 2023-02-10 ENCOUNTER — Ambulatory Visit (HOSPITAL_COMMUNITY)
Admission: RE | Admit: 2023-02-10 | Discharge: 2023-02-10 | Disposition: A | Discharge status: Home / Self Care | Payer: Medicare HMO | Source: Ambulatory Visit | Attending: Urology | Admitting: Urology

## 2023-02-10 DIAGNOSIS — R9721 Rising PSA following treatment for malignant neoplasm of prostate: Secondary | ICD-10-CM | POA: Diagnosis present

## 2023-02-10 DIAGNOSIS — C61 Malignant neoplasm of prostate: Secondary | ICD-10-CM | POA: Insufficient documentation

## 2023-02-10 MED ORDER — FLOTUFOLASTAT F 18 GALLIUM 296-5846 MBQ/ML IV SOLN
8.6000 | Freq: Once | INTRAVENOUS | Status: AC
  Administered 2023-02-10: 8.6 via INTRAVENOUS

## 2023-02-13 ENCOUNTER — Encounter: Payer: Self-pay | Admitting: Radiation Oncology

## 2023-02-13 NOTE — Progress Notes (Incomplete)
GU Location of Tumor / Histology: Prostate Ca (recurrent)  PSA 0.38 on 10/2022  If Prostate Cancer, Gleason Score is (3 + 4) and PSA is (6.9 pre prostatectomy)  Prostatectomy (with Dr. Margo Aye in 2011)  Past/Anticipated interventions by urology, if any:   Dr. Rhoderick Moody   Past/Anticipated interventions by medical oncology, if any: NA  Weight changes, if any: {:18581}  IPSS: SHIM:  Bowel/Bladder complaints, if any: {:18581}   Nausea/Vomiting, if any: {:18581}  Pain issues, if any:  {:18581}  SAFETY ISSUES: Prior radiation? {:18581} Pacemaker/ICD? {:18581} Possible current pregnancy? Male Is the patient on methotrexate? No  Current Complaints / other details:

## 2023-02-15 ENCOUNTER — Encounter: Payer: Self-pay | Admitting: Urology

## 2023-02-16 ENCOUNTER — Encounter: Payer: Self-pay | Admitting: Radiation Oncology

## 2023-02-16 ENCOUNTER — Ambulatory Visit
Admission: RE | Admit: 2023-02-16 | Discharge: 2023-02-16 | Disposition: A | Discharge status: Home / Self Care | Payer: Medicare HMO | Source: Ambulatory Visit | Attending: Radiation Oncology | Admitting: Radiation Oncology

## 2023-02-16 VITALS — BP 151/87 | HR 113 | Temp 97.1°F | Resp 18 | Ht 67.0 in | Wt 237.2 lb

## 2023-02-16 DIAGNOSIS — Z8701 Personal history of pneumonia (recurrent): Secondary | ICD-10-CM | POA: Insufficient documentation

## 2023-02-16 DIAGNOSIS — Z923 Personal history of irradiation: Secondary | ICD-10-CM | POA: Diagnosis not present

## 2023-02-16 DIAGNOSIS — I10 Essential (primary) hypertension: Secondary | ICD-10-CM | POA: Insufficient documentation

## 2023-02-16 DIAGNOSIS — Z800 Family history of malignant neoplasm of digestive organs: Secondary | ICD-10-CM | POA: Insufficient documentation

## 2023-02-16 DIAGNOSIS — C61 Malignant neoplasm of prostate: Secondary | ICD-10-CM | POA: Insufficient documentation

## 2023-02-16 DIAGNOSIS — Z79899 Other long term (current) drug therapy: Secondary | ICD-10-CM | POA: Diagnosis not present

## 2023-02-16 DIAGNOSIS — Z7982 Long term (current) use of aspirin: Secondary | ICD-10-CM | POA: Diagnosis not present

## 2023-02-16 DIAGNOSIS — N393 Stress incontinence (female) (male): Secondary | ICD-10-CM | POA: Insufficient documentation

## 2023-02-16 DIAGNOSIS — Z7984 Long term (current) use of oral hypoglycemic drugs: Secondary | ICD-10-CM | POA: Diagnosis not present

## 2023-02-16 DIAGNOSIS — Z801 Family history of malignant neoplasm of trachea, bronchus and lung: Secondary | ICD-10-CM | POA: Insufficient documentation

## 2023-02-16 DIAGNOSIS — R9721 Rising PSA following treatment for malignant neoplasm of prostate: Secondary | ICD-10-CM

## 2023-02-16 DIAGNOSIS — E785 Hyperlipidemia, unspecified: Secondary | ICD-10-CM | POA: Insufficient documentation

## 2023-02-16 DIAGNOSIS — E119 Type 2 diabetes mellitus without complications: Secondary | ICD-10-CM | POA: Insufficient documentation

## 2023-02-16 DIAGNOSIS — Z8616 Personal history of COVID-19: Secondary | ICD-10-CM | POA: Diagnosis not present

## 2023-02-16 DIAGNOSIS — I700 Atherosclerosis of aorta: Secondary | ICD-10-CM | POA: Diagnosis not present

## 2023-02-16 DIAGNOSIS — K219 Gastro-esophageal reflux disease without esophagitis: Secondary | ICD-10-CM | POA: Insufficient documentation

## 2023-02-16 DIAGNOSIS — F17210 Nicotine dependence, cigarettes, uncomplicated: Secondary | ICD-10-CM | POA: Diagnosis not present

## 2023-02-16 HISTORY — DX: Gastro-esophageal reflux disease without esophagitis: K21.9

## 2023-02-16 HISTORY — DX: Stress incontinence (female) (male): N39.3

## 2023-02-16 HISTORY — DX: Anxiety disorder, unspecified: F41.9

## 2023-02-16 NOTE — Progress Notes (Signed)
Radiation Oncology         [phone] ________________________________  Initial outpatient Consultation  Name: Joseph Harvey MRN: [id number]  Date of Service: 02/16/2023 DOB: 1956/02/06  GE:XBMWUX, Merry Proud, NP  Shelly Rubenstein*   REFERRING PHYSICIAN: Shelly Rubenstein*  DIAGNOSIS: 67 y/o man with biochemically recurrent prostate cancer with postoperative PSA of 0.37 s/p RALP in 2011 for pT2c, Gleason 3+4 adenocarcinoma of the prostate.    ICD-10-CM   1. Malignant neoplasm of prostate (HCC)  C61     2. Biochemically recurrent malignant neoplasm of prostate [facility])  C61    R97.21       HISTORY OF PRESENT ILLNESS: Joseph Harvey is a 67 y.o. male seen at the request of Dr. Liliane Shi.  He was initially diagnosed with Gleason 3+3 adenocarcinoma of the prostate on biopsy with Dr. Shelby Dubin on 12/24/2009 with pretreatment PSA of 6.9.  He elected to proceed with robotic assisted prostatectomy, performed on 03/05/2010 with surgical pathology confirming pT2c, Gleason 3+4 with extraprostatic extension, perineural invasion and focally positive margins.  His PSA has been steadily rising since 2017.  He presented to Dr. Arita Miss on 07/30/2020 for evaluation and consideration of testosterone replacement therapy for management of EP.  Given his history of rising postoperative PSA, she did not recommend TRT.  A repeat PSA that day was 0.14 and remained stable at 0.12 when repeated on 10/20/2020.  He was lost to follow-up thereafter.  However, he returned to [facility] urology to see Dr. Liliane Shi on 01/02/2023 with a PSA of 0.37.  A PSMA PET scan was performed on 02/10/2023 for further evaluation and disease restaging and this did not show any obvious local recurrence or metastatic disease. He has been kindly referred to [place] today to discuss the role for salvage radiotherapy.  PREVIOUS RADIATION THERAPY: No  PAST MEDICAL HISTORY:  Past Medical History:  Diagnosis Date   Allergy    Dust, mold, dust mites    Anemia    Anxiety    Asthma    Cancer (HCC)    prostate   Cataract    bilateral repair.   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   Pneumonia    Stress incontinence       PAST SURGICAL HISTORY: Past Surgical History:  Procedure Laterality Date   BACK SURGERY  2017   CATARACT EXTRACTION Bilateral    COLONOSCOPY     2013   COLONOSCOPY  10/03/2019   KNEE ARTHROSCOPY  2005   right   PROSTATE BIOPSY     PROSTATE SURGERY     PVC ABLATION N/A 08/10/2021   Procedure: PVC ABLATION;  Surgeon: Regan Lemming, MD;  Location: [facility];  Service: Cardiovascular;  Laterality: N/A;   ROBOT ASSISTED LAPAROSCOPIC RADICAL PROSTATECTOMY  03/2010    FAMILY HISTORY:  Family History  Problem Relation Age of Onset   Colon cancer Father    Lung cancer Father        was a smoker   Prostate cancer Father    Lung cancer Mother        was a smoker   Asthma Mother    Allergies Brother    Diabetes Maternal Grandmother    Colon polyps Neg Hx    Esophageal cancer Neg Hx  Stomach cancer Neg Hx    Rectal cancer Neg Hx     SOCIAL HISTORY:  Social History   Socioeconomic History   Marital status: Single    Spouse name: Not on file   Number of children: 2   Years of education: 12 grade   Highest education level: Not on file  Occupational History   Occupation: Facilities manager: [employer]  Tobacco Use   Smoking status: Every Day    Current packs/day: 0.75    Average packs/day: 0.8 packs/day for 48.8 years (36.6 ttl pk-yrs)    Types: Cigarettes    Start date: 1976   Smokeless tobacco: Never   Tobacco comments:    still smoking 0.5 ppd  Vaping Use   Vaping status: Never Used  Substance and Sexual Activity   Alcohol use: Not Currently    Alcohol/week: 3.0 standard drinks of alcohol     Types: 3 Cans of beer per week   Drug use: No   Sexual activity: Not on file  Other Topics Concern   Not on file  Social History Narrative   Not on file   Social Determinants of Health   Financial Resource Strain: Not on file  Food Insecurity: No Food Insecurity (02/16/2023)   Hunger Vital Sign    Worried About Running Out of Food in the Last Year: Never true    Ran Out of Food in the Last Year: Never true  Transportation Needs: No Transportation Needs (02/16/2023)   PRAPARE - Administrator, Civil Service (Medical): No    Lack of Transportation (Non-Medical): No  Physical Activity: Not on file  Stress: No Stress Concern Present (05/26/2020)   [employer] of Occupational Health - Occupational Stress Questionnaire    Feeling of Stress : Only a little  Social Connections: Not on file  Intimate Partner Violence: Not At Risk (02/16/2023)   Humiliation, Afraid, Rape, and Kick questionnaire    Fear of Current or Ex-Partner: No    Emotionally Abused: No    Physically Abused: No    Sexually Abused: No    ALLERGIES: Amoxicillin, Lisinopril, Molds & smuts, Robitussin [guaifenesin], and Azithromycin  MEDICATIONS:  Current Outpatient Medications  Medication Sig Dispense Refill   acetaminophen (TYLENOL) 325 MG tablet Take 2 tablets (650 mg total) by mouth every 4 (four) hours as needed for headache or mild pain.     albuterol (PROVENTIL) (2.5 MG/3ML) 0.083% nebulizer solution TAKE 3 MLS BY NEBULIZATION EVERY 4 HOURS AS NEEDED FOR WHEEZING OR SHORTNESS OF BREATH (((PLAN B))) 375 mL 6   albuterol (VENTOLIN HFA) 108 (90 Base) MCG/ACT inhaler Inhale 2 puffs into the lungs every 4 hours as needed for shortness of breath or wheezing 18 g 5   amLODipine (NORVASC) 5 MG tablet Take 1 tablet (5 mg total) by mouth daily. (Patient taking differently: Take 5 mg by mouth in the morning and at bedtime.) 90 tablet 3   ASPIRIN 81 PO Take 81 mg by mouth daily.     azithromycin (ZITHROMAX) 250  MG tablet Take 250 mg by mouth as directed.     Blood Glucose Monitoring Suppl (TRUE METRIX METER) w/Device KIT Use as directed 1 kit 0   Budeson-Glycopyrrol-Formoterol (BREZTRI AEROSPHERE) 160-9-4.8 MCG/ACT AERO Inhale 2 puffs into the lungs in the morning and at bedtime. 10.7 g 5   busPIRone (BUSPAR) 10 MG tablet Take 1 tablet (10 mg total) by mouth 3 (three) times daily. (Patient taking differently: Take  10 mg by mouth 3 (three) times daily as needed (anxiety).) 90 tablet 3   Cholecalciferol (VITAMIN D PO) Take 1 tablet by mouth daily.     Dupilumab (DUPIXENT) 300 MG/2ML SOPN Inject 300 mg into the skin every 14 (fourteen) days. **loading dose completed in clinic on 12/15/22** 12 mL 1   ezetimibe (ZETIA) 10 MG tablet Take 1 tablet (10 mg total) by mouth daily. 90 tablet 3   famotidine (PEPCID) 20 MG tablet Take 20 mg by mouth daily.     furosemide (LASIX) 40 MG tablet Take 40 mg daily if gains 3 lbs overnight or 5 lbs in 1 week 30 tablet 3   gabapentin (NEURONTIN) 300 MG capsule take 1 capsule by mouth in the morning, then take 1 at lunch and 2 at bedtime (Patient taking differently: Take 300 mg by mouth 3 (three) times daily as needed (Nerve pain).) 360 capsule 3   glucose blood (TRUE METRIX BLOOD GLUCOSE TEST) test strip Use as instructed 100 each 12   ipratropium (ATROVENT) 0.06 % nasal spray PLACE 2 SPRAYS INTO BOTH NOSTRILS 3 (THREE) TIMES DAILY. AS NEEDED FOR NASAL CONGESTION, RUNNY NOSE 15 mL 2   ipratropium-albuterol (DUONEB) 0.5-2.5 (3) MG/3ML SOLN INHALE 1 VIAL VIA NEBULIZER TWICE A DAY 90 mL 3   lidocaine (LIDODERM) 5 % Apply 1 patch to affected area daily and leave on for 12 hours at a time.  Remove after 12 hours 30 patch 0   linaclotide (LINZESS) 72 MCG capsule Take 1 capsule (72 mcg total) by mouth daily before breakfast. 30 capsule 6   metFORMIN (GLUCOPHAGE-XR) 500 MG 24 hr tablet Take 2 tablets (1,000 mg total) by mouth in the morning AND 1 tablet (500 mg total) every evening with  meals. 270 tablet 0   metoprolol succinate (TOPROL-XL) 100 MG 24 hr tablet Take 1 tablet (100 mg total) by mouth daily. Take with or immediately following a meal. 90 tablet 3   montelukast (SINGULAIR) 10 MG tablet Take 1 tablet (10 mg total) by mouth at bedtime. 30 tablet 3   omeprazole (PRILOSEC) 40 MG capsule Take 40 mg by mouth every morning.     pantoprazole (PROTONIX) 40 MG tablet Take 1 tablet (40 mg total) by mouth daily as needed. 30 tablet 2   potassium chloride SA (KLOR-CON M) 20 MEQ tablet Take 1 tablet (20 mEq total) by mouth daily as needed (When hands cramp up). 60 tablet 2   predniSONE (DELTASONE) 10 MG tablet Take 3 tablets (30 mg total) by mouth daily as needed. 30 tablet 11   Respiratory Therapy Supplies (FLUTTER) DEVI Use three times a day after inhaler or nebulizer use 1 each 0   rosuvastatin (CRESTOR) 20 MG tablet Take 1 tablet (20 mg total) by mouth daily. 90 tablet 3   Semaglutide (RYBELSUS) 7 MG TABS Take 1 tablet by mouth daily.     silver sulfADIAZINE (SILVADENE) 1 % cream Apply twice a day to affected area 50 g 0   traMADol (ULTRAM) 50 MG tablet Take 1 tablet (50 mg total) by mouth every evening. Each prescription to last 1 mth 30 tablet 2   TRUEplus Lancets 28G MISC Use as directed 100 each 4   valsartan (DIOVAN) 320 MG tablet Take 1 tablet (320 mg total) by mouth daily. 90 tablet 3   No current facility-administered medications for this encounter.    REVIEW OF SYSTEMS:  On review of systems, the patient reports that he is doing well overall.  He denies any chest pain, shortness of breath, cough, fevers, chills, night sweats, unintended weight changes.  He denies any bowel or bladder disturbances, and denies abdominal pain, nausea or vomiting.  His IPSS score is 3, indicating minimal urinary symptoms.  His SHIM score is 15, indicating he has mild to moderate erectile dysfunction.  He is denies any new musculoskeletal or joint aches or pains. A complete review of systems  is obtained and is otherwise negative.    PHYSICAL EXAM:  Wt Readings from Last 3 Encounters:  02/16/23 237 lb 4 oz (107.6 kg)  01/10/23 240 lb 9.6 oz (109.1 kg)  11/29/22 244 lb (110.7 kg)   Temp Readings from Last 3 Encounters:  02/16/23 (!) 97.1 F (36.2 C) (Temporal)  09/23/22 98.6 F (37 C) (Oral)  04/10/22 98.8 F (37.1 C) (Oral)   BP Readings from Last 3 Encounters:  02/16/23 (!) 151/87  01/10/23 108/60  11/29/22 118/74   Pulse Readings from Last 3 Encounters:  02/16/23 (!) 113  01/10/23 77  11/29/22 85   Pain Assessment Pain Score: 0-No pain/10  In general this is a well appearing African-American man in no acute distress.  He is's alert and oriented x4 and appropriate throughout the examination. Cardiopulmonary assessment is negative for acute distress and he exhibits normal effort.   KPS = 100  100 - Normal; no complaints; no evidence of disease. 90   - Able to carry on normal activity; minor signs or symptoms of disease. 80   - Normal activity with effort; some signs or symptoms of disease. 23   - Cares for self; unable to carry on normal activity or to do active work. 60   - Requires occasional assistance, but is able to care for most of his personal needs. 50   - Requires considerable assistance and frequent medical care. 40   - Disabled; requires special care and assistance. 30   - Severely disabled; hospital admission is indicated although death not imminent. 20   - Very sick; hospital admission necessary; active supportive treatment necessary. 10   - Moribund; fatal processes progressing rapidly. 0     - Dead  Karnofsky DA, Abelmann WH, Craver LS and Burchenal [facility] [phone]) The use of the nitrogen mustards in the palliative treatment of carcinoma: with particular reference to bronchogenic carcinoma Cancer 1 634-56  LABORATORY DATA:  Lab Results  Component Value Date   WBC 9.8 09/23/2022   HGB 13.7 09/23/2022   HCT 40.6 09/23/2022   MCV 94.2 09/23/2022    PLT 302 09/23/2022   Lab Results  Component Value Date   NA 140 01/10/2023   K 4.1 01/10/2023   CL 102 01/10/2023   CO2 27 01/10/2023   Lab Results  Component Value Date   ALT 27 08/08/2022   AST 21 08/08/2022   ALKPHOS 70 08/08/2022   BILITOT 0.6 08/08/2022     RADIOGRAPHY: NM PET (PSMA) SKULL TO MID THIGH  Result Date: 02/15/2023 CLINICAL DATA:  Prostate carcinoma with biochemical recurrence. PSA equal 0.37 EXAM: NUCLEAR MEDICINE PET SKULL BASE TO THIGH TECHNIQUE: 8.6 mCi Flotufolastat (Posluma) was injected intravenously. Full-ring PET imaging was performed from the skull base to thigh after the radiotracer. CT data was obtained and used for attenuation correction and anatomic localization. COMPARISON:  None Available. FINDINGS: NECK No radiotracer activity in neck lymph nodes. Incidental CT finding: None. CHEST No radiotracer accumulation within mediastinal or hilar lymph nodes. No suspicious pulmonary nodules on the CT scan. Incidental CT  finding: None. ABDOMEN/PELVIS Prostate: No focal activity in the prostate bed. Lymph nodes: No abnormal radiotracer accumulation within pelvic or abdominal nodes. Liver: No evidence of liver metastasis. Incidental CT finding: Nodule enlargement of the lateral limb of the LEFT adrenal gland to 1.5 cm. This enlargement has low density (HU less than 10) consistent benign adenoma. Simple fluid attenuation cyst in the cortex of the RIGHT kidney measures 16 mm on image 131. No follow-up recommended for benign renal lesion. Atherosclerotic calcification of the aorta. SKELETON No focal activity to suggest skeletal metastasis. IMPRESSION: 1. No evidence of local prostate carcinoma recurrence in the prostate bed. 2. No evidence of metastatic adenopathy in the pelvis or periaortic retroperitoneum. 3. No evidence of visceral metastasis or skeletal metastasis. 4. Benign LEFT adrenal adenoma. 5.  Aortic Atherosclerosis (ICD10-I70.0). Electronically Signed   By: Genevive Bi M.D.   On: 02/15/2023 10:18      IMPRESSION/PLAN: 1. 67 y.o. man with biochemically recurrent prostate cancer with postoperative PSA of 0.37 s/p RALP in 2011 for pT2c, Gleason 3+4 adenocarcinoma of the prostate.  Today we reviewed the findings and workup thus far.  We discussed the natural history of prostate cancer.  We reviewed the the implications of positive margins, extracapsular extension, and seminal vesicle involvement on the risk of prostate cancer recurrence.  In his case, extraprostatic extension, perineural invasion and focally positive margins were present on his surgical pathology and he has a slowly rising postoperative PSA.  We reviewed some of the evidence suggesting an advantage for patients who undergo salvage radiotherapy in this setting in terms of disease control and overall survival. We discussed radiation treatment directed to the prostatic fossa and pelvic lymph nodes with regard to the logistics and delivery of external beam radiation treatment.  We also discussed and outlined the risks, benefits, short and long-term effects associated with radiotherapy and he was encouraged to ask questions that were answered to his stated satisfaction.  At the conclusion of our conversation, the patient is in agreement to proceed with the recommended 7.5-week course of daily salvage radiotherapy to the prostate fossa and pelvic lymph nodes.  He has freely signed written consent to proceed today in the office and a copy of this document will be placed in his medical record.  He is tentatively scheduled for CT simulation/treatment planning at 9:30 AM on Friday, 02/17/2023, in anticipation of beginning his daily treatments in the near future.  We will share our findings with Dr. Liliane Shi and will look forward to continuing to participate in his care.  He knows that he is welcome to call at anytime with any questions or concerns related to the treatment options discussed today.   We personally  spent 70 minutes in this encounter including chart review, reviewing radiological studies, meeting face-to-face with the patient, entering orders and completing documentation.     Marguarite Arbour, PA-C    Margaretmary Dys, MD  [facility]  Radiation Oncology Direct Dial: [phone]  Fax: [phone] [email or web address]  Skype  LinkedIn    References:  JAMA. 2006 Nov 15;296(19):2329-35.  Adjuvant radiotherapy for pathologically advanced prostate cancer: a randomized clinical trial.  Janee Morn IM Jr(1), Tangen CM, Raymondo Band MS, Lazarus Salines, Messing Grier [facility].  Author information: (1)Department of Urology, [facility], [id number], [address], [place], [place] [address], [place]. [email or web address]  CONTEXT: Despite a stage-shift to earlier cancer stages  and lower tumor volumes for prostate cancer, pathologically advanced disease is detected at radical prostatectomy in 38% to 52% of patients. However, the optimal management of these patients after radical prostatectomy is unknown.  OBJECTIVE: To determine whether adjuvant radiotherapy improves metastasis-free survival in patients with stage pT3 N0 M0 prostate cancer.  DESIGN, SETTING, AND PATIENTS: Randomized, prospective, multi-institutional, [place] clinical trial with enrollment between December 29, 1986, and May 17, 1995 (with database frozen for statistical analysis on February 04, 2004). Patients were 425 men with pathologically advanced prostate cancer who had undergone radical prostatectomy. INTERVENTION: Men were randomly assigned to receive 60 to 64 Gy of external beam radiotherapy delivered to the prostatic fossa (n = 214) or usual care plus observation (n = 211).  MAIN OUTCOME MEASURES: Primary outcome was metastasis-free survival, defined as time to first occurrence of metastatic disease or death due to any cause. Secondary  outcomes included prostate-specific antigen (PSA) relapse, recurrence-free survival, overall survival, freedom from hormonal therapy, and postoperative complications.  RESULTS: Among the 425 men, median follow-up was 10.6 years (interquartile range, 9.2-12.7 years). For metastasis-free survival, 76 (35.5%) of 214 men in the adjuvant radiotherapy group were diagnosed with metastatic disease or died (median metastasis-free estimate, 14.7 years), compared with 91 (43.1%) of 211 (median metastasis-free estimate, 13.2 years) of those in the observation group (hazard ratio [HR], 0.75; 95% CI, 0.55-1.02; P = .06). There were no significant between-group differences for overall survival (71 deaths, median survival of 14.7 years for radiotherapy vs 83 deaths, median survival of 13.8 years for observation; HR, 0.80; 95% CI, 0.58-1.09; P = .16). PSA relapse (median PSA relapse-free survival, 10.3 years for radiotherapy vs 3.1 years for observation; HR, 0.43; 95% CI, 0.31-0.58; P<.001) and disease recurrence (median recurrence-free survival, 13.8 years for radiotherapy vs 9.9 years for observation; HR, 0.62; 95% CI, 0.46-0.82; P = .001) were both significantly reduced with radiotherapy. Adverse effects were more common with radiotherapy vs observation (23.8% vs 11.9%), including rectal complications (3.3% vs 0%), urethral strictures (17.8% vs 9.5%), and total urinary incontinence (6.5% vs 2.8%).  CONCLUSIONS: In men who had undergone radical prostatectomy for pathologically advanced prostate cancer, adjuvant radiotherapy resulted in significantly reduced risk of PSA relapse and disease recurrence, although the improvements in metastasis-free survival and overall survival were not statistically significant.  Trial Registration clinicaltrials.gov Identifier: [id number]. PMID: [id number]   J Clin Oncol. 2007 Jun 1;25(16):2225-9. Predominant treatment failure in postprostatectomy patients is local: analysis of patterns of  treatment failure in SWOG 8794.  Swanson GP(1), Folsom MA, Tangen CM, Chin J, Messing E, Canby-Hagino Severiano Gilbert, [facility] [place] [facility];  Author information: (1)Department of Radiation Oncology and Urology, [facility], [place], [place] [address], [place]. [email or web address] Comment in J Clin Oncol. 2007 Dec 10;25(35):5671-2.  PURPOSE: [facility] (SWOG) trial [phone] demonstrated that adjuvant radiation reduces the risk of biochemical (prostate-specific antigen [PSA]) treatment failure by 50% over radical prostatectomy alone. In this analysis, we stratified patients as to their preradiation PSA levels and correlated it with outcomes such as PSA treatment failure, local recurrence, and distant failure, to serve as guidelines for future research.  PATIENTS AND METHODS: Four hundred thirty-one subjects with pathologically advanced prostate cancer (extraprostatic extension, positive surgical margins, or seminal vesicle invasion) were randomly assigned to adjuvant radiotherapy or observation.  RESULTS: Three hundred seventy-four eligible patients had immediate postprostatectomy and follow-up PSA data. Median follow-up was 10.2 years. For patients with a postsurgical PSA of 0.2 ng/mL, radiation was associated with  reductions in the 10-year risk of biochemical treatment failure (72% to 42%), local failures (20% to 7%), and distant failures (12% to 4%). For patients with a postsurgical PSA between higher than 0.2 and <or = 1.0 ng/mL, reductions in the 10-year risk of biochemical failure (80% to 73%), local failures (25% to 9%), and distant failures (16% to 12%) were realized. In patients with postsurgical PSA higher than 1.0, the respective findings were 94% versus 100%, 28% versus 9%, and 44% versus 18%.  CONCLUSION: The pattern of treatment failure in high-risk patients is predominantly local with a surprisingly low incidence of metastatic failure. Adjuvant radiation to the  prostate bed reduces the risk of metastatic disease and biochemical failure at all postsurgical PSA levels. Further improvement in reducing local treatment failure is likely to have the greatest impact on outcome in high-risk patients after prostatectomy.  PMID: [id number]     J Urol. 2009 Mar;181(3):956-62.  Adjuvant radiotherapy for pathological T3N0M0 prostate cancer significantly reduces risk of metastases and improves survival: long-term followup of a randomized clinical trial.  Janee Morn IM(1), Tangen CM, Raymondo Band MS, Lazarus Salines, Messing Grier [facility].  Chartered loss adjuster information: (1)[employer] [facility] [place], [place], [place], [place]. PURPOSE: Extraprostatic disease will be manifest in a third of men after radical prostatectomy. We present the long-term followup of a randomized clinical trial of radiotherapy to reduce the risk of subsequent metastatic disease and death.  MATERIALS AND METHODS: A total of 431 men with pT3N0M0 prostate cancer were randomized to 60 to 64 Gy adjuvant radiotherapy or observation. The primary study end point was metastasis-free survival.  RESULTS: Of 425 eligible men 211 were randomized to observation and 214 to adjuvant radiation. Of those men under observation 70 ultimately received radiotherapy. Metastasis-free survival was significantly greater with radiotherapy (93 of 214 events on the radiotherapy arm vs 114 of 211 events on observation; HR 0.71; 95% CI 0.54, 0.94; p = 0.016). Survival improved significantly with adjuvant radiation (88 deaths of 214 on the radiotherapy arm vs 110 deaths of 211 on observation; HR 0.72; 95% CI 0.55, 0.96; p = 0.023).  CONCLUSIONS: Adjuvant radiotherapy after radical prostatectomy for a man with pT3N0M0 prostate cancer significantly reduces the risk of metastasis and increases survival.  PMCID: [id number] PMID: [id number]     I personally spent 60  minutes in this encounter including chart review, reviewing radiological studies, meeting face-to-face with the patient, entering orders and completing documentation.    Marguarite Arbour, PA-C    Margaretmary Dys, MD  [facility]  Radiation Oncology Direct Dial: [phone]  Fax: [phone] [email or web address]  Skype  LinkedIn

## 2023-02-16 NOTE — Addendum Note (Signed)
Encounter addended by: Marcello Fennel, PA-C on: 02/16/2023 10:22 PM  Actions taken: Level of Service modified, Pend clinical note

## 2023-02-16 NOTE — Progress Notes (Signed)
GU Location of Tumor / Histology: Prostate Ca ( biochemical recurrent)  If Prostate Cancer, Gleason Score is (3 + 4) and PSA is (6.9 pre prostatectomy)  PSA 0.38 on 10/2022  Prostatectomy (with Dr. Margo Aye in 2011)   Dr. Cristal Deer Liliane Shi   02/10/2023 Dr. Rhoderick Moody NM PET (PSMA) Skull to Mid Thigh CLINICAL DATA:  Prostate carcinoma with biochemical recurrence. PSA equal 0.37  IMPRESSION: 1. No evidence of local prostate carcinoma recurrence in the prostate bed. 2. No evidence of metastatic adenopathy in the pelvis or periaortic retroperitoneum. 3. No evidence of visceral metastasis or skeletal metastasis. 4. Benign LEFT adrenal adenoma. 5.  Aortic Atherosclerosis (ICD10-I70.0).  Past/Anticipated interventions by urology, if any:   Dr. Rhoderick Moody   Past/Anticipated interventions by medical oncology, if any: NA  Weight changes, if any: No  IPSS:  3 SHIM:  15  Bowel/Bladder complaints, if any:  No  Nausea/Vomiting, if any: No  Pain issues, if any:  0/10  SAFETY ISSUES: Prior radiation? No Pacemaker/ICD? No Possible current pregnancy? Male Is the patient on methotrexate? No  Current Complaints / other details:

## 2023-02-16 NOTE — Progress Notes (Signed)
Introduced myself to the patient as the prostate nurse navigator.  No barriers to care identified at this time.  He is here to discuss his radiation treatment options.  I gave him my business card and asked him to call me with questions or concerns.  Verbalized understanding.  ?

## 2023-02-17 ENCOUNTER — Ambulatory Visit
Admission: RE | Admit: 2023-02-17 | Discharge: 2023-02-17 | Disposition: A | Discharge status: Home / Self Care | Payer: Medicare HMO | Source: Ambulatory Visit | Attending: Radiation Oncology | Admitting: Radiation Oncology

## 2023-02-17 DIAGNOSIS — Z510 Encounter for antineoplastic radiation therapy: Secondary | ICD-10-CM | POA: Insufficient documentation

## 2023-02-17 DIAGNOSIS — R9721 Rising PSA following treatment for malignant neoplasm of prostate: Secondary | ICD-10-CM | POA: Diagnosis not present

## 2023-02-17 DIAGNOSIS — C61 Malignant neoplasm of prostate: Secondary | ICD-10-CM | POA: Insufficient documentation

## 2023-02-17 NOTE — Addendum Note (Signed)
Encounter addended by: Margaretmary Dys, MD on: 02/17/2023 7:58 AM  Actions taken: Problem List reviewed, Medication List reviewed, Allergies reviewed, Clinical Note Signed

## 2023-02-17 NOTE — Progress Notes (Signed)
Radiation Oncology         [id number]) [phone] ________________________________  Name: Joseph Harvey MRN: [id number]  Date: 02/17/2023  DOB: Mar 07, 1956  SIMULATION AND TREATMENT PLANNING NOTE    ICD-10-CM   1. Biochemically recurrent malignant neoplasm of prostate [facility])  C61    R97.21       DIAGNOSIS:  67 y/o man with biochemically recurrent prostate cancer with postoperative PSA of 0.37 s/p RALP in 2011 for pT2c, Gleason 3+4 adenocarcinoma  NARRATIVE:  The patient was brought to the CT Simulation planning suite.  Identity was confirmed.  All relevant records and images related to the planned course of therapy were reviewed.  The patient freely provided informed written consent to proceed with treatment after reviewing the details related to the planned course of therapy. The consent form was witnessed and verified by the simulation staff.  Then, the patient was set-up in a stable reproducible supine position for radiation therapy.  A vacuum lock pillow device was custom fabricated to position his legs in a reproducible immobilized position.  Then, I performed a urethrogram under sterile conditions to identify the prostatic bed.  CT images were obtained.  Surface markings were placed.  The CT images were loaded into the planning software.  Then the prostate bed target, pelvic lymph node target and avoidance structures including the rectum, bladder, bowel and hips were contoured.  Treatment planning then occurred.  The radiation prescription was entered and confirmed.  A total of one complex treatment devices were fabricated. I have requested : Intensity Modulated Radiotherapy (IMRT) is medically necessary for this case for the following reason:  Rectal sparing.Marland Kitchen  PLAN:  The patient will receive 45 Gy in 25 fractions of 1.8 Gy, followed by a boost to the prostate bed to a total dose of 68.4 Gy with 13 additional fractions of 1.8 Gy.   ________________________________  Artist Pais Kathrynn Running, M.D.

## 2023-02-20 ENCOUNTER — Encounter: Payer: Self-pay | Admitting: Pulmonary Disease

## 2023-02-20 ENCOUNTER — Other Ambulatory Visit: Payer: Self-pay

## 2023-02-20 MED ORDER — BREZTRI AEROSPHERE 160-9-4.8 MCG/ACT IN AERO: 2.0000 | INHALATION_SPRAY | Freq: Two times a day (BID) | RESPIRATORY_TRACT | 3 refills | Status: AC

## 2023-02-27 ENCOUNTER — Encounter: Payer: Self-pay | Admitting: Cardiology

## 2023-02-27 DIAGNOSIS — Z510 Encounter for antineoplastic radiation therapy: Secondary | ICD-10-CM | POA: Diagnosis not present

## 2023-02-28 ENCOUNTER — Telehealth: Payer: Self-pay | Admitting: Cardiology

## 2023-02-28 ENCOUNTER — Other Ambulatory Visit: Payer: Self-pay

## 2023-02-28 MED ORDER — AMLODIPINE BESYLATE 5 MG PO TABS: 5.0000 mg | ORAL_TABLET | Freq: Every day | ORAL | 3 refills | Status: DC

## 2023-02-28 NOTE — Telephone Encounter (Signed)
*  STAT* If patient is at the pharmacy, call can be transferred to refill team.   1. Which medications need to be refilled? (please list name of each medication and dose if known)   amLODipine (NORVASC) 5 MG tablet    2. Which pharmacy/location (including street and city if local pharmacy) is medication to be sent to?  [id number] - [place], [place] - [address]      3. Do they need a 30 day or 90 day supply? 90 day    Pt is out of this medication due to the increase of him taking 2 tablets a day now.

## 2023-03-06 ENCOUNTER — Ambulatory Visit
Admission: RE | Admit: 2023-03-06 | Discharge: 2023-03-06 | Disposition: A | Discharge status: Home / Self Care | Payer: Medicare HMO | Source: Ambulatory Visit | Attending: Radiation Oncology | Admitting: Radiation Oncology

## 2023-03-06 ENCOUNTER — Other Ambulatory Visit: Payer: Self-pay

## 2023-03-06 DIAGNOSIS — Z510 Encounter for antineoplastic radiation therapy: Secondary | ICD-10-CM | POA: Diagnosis not present

## 2023-03-06 LAB — RAD ONC ARIA SESSION SUMMARY
Course Elapsed Days: 0
Plan Fractions Treated to Date: 1
Plan Prescribed Dose Per Fraction: 1.8 Gy
Plan Total Fractions Prescribed: 25
Plan Total Prescribed Dose: 45 Gy
Reference Point Dosage Given to Date: 1.8 Gy
Reference Point Session Dosage Given: 1.8 Gy
Session Number: 1

## 2023-03-07 ENCOUNTER — Ambulatory Visit
Admission: RE | Admit: 2023-03-07 | Discharge: 2023-03-07 | Disposition: A | Discharge status: Home / Self Care | Payer: Medicare HMO | Source: Ambulatory Visit | Attending: Radiation Oncology | Admitting: Radiation Oncology

## 2023-03-07 ENCOUNTER — Other Ambulatory Visit: Payer: Self-pay

## 2023-03-07 DIAGNOSIS — Z510 Encounter for antineoplastic radiation therapy: Secondary | ICD-10-CM | POA: Diagnosis not present

## 2023-03-07 LAB — RAD ONC ARIA SESSION SUMMARY
Course Elapsed Days: 1
Plan Fractions Treated to Date: 2
Plan Prescribed Dose Per Fraction: 1.8 Gy
Plan Total Fractions Prescribed: 25
Plan Total Prescribed Dose: 45 Gy
Reference Point Dosage Given to Date: 3.6 Gy
Reference Point Session Dosage Given: 1.8 Gy
Session Number: 2

## 2023-03-08 ENCOUNTER — Other Ambulatory Visit: Payer: Self-pay

## 2023-03-08 ENCOUNTER — Ambulatory Visit
Admission: RE | Admit: 2023-03-08 | Discharge: 2023-03-08 | Disposition: A | Discharge status: Home / Self Care | Payer: Medicare HMO | Source: Ambulatory Visit | Attending: Radiation Oncology

## 2023-03-08 DIAGNOSIS — Z510 Encounter for antineoplastic radiation therapy: Secondary | ICD-10-CM | POA: Diagnosis not present

## 2023-03-08 LAB — RAD ONC ARIA SESSION SUMMARY
Course Elapsed Days: 2
Plan Fractions Treated to Date: 3
Plan Prescribed Dose Per Fraction: 1.8 Gy
Plan Total Fractions Prescribed: 25
Plan Total Prescribed Dose: 45 Gy
Reference Point Dosage Given to Date: 5.4 Gy
Reference Point Session Dosage Given: 1.8 Gy
Session Number: 3

## 2023-03-09 ENCOUNTER — Ambulatory Visit
Admission: RE | Admit: 2023-03-09 | Discharge: 2023-03-09 | Disposition: A | Discharge status: Home / Self Care | Payer: Medicare HMO | Source: Ambulatory Visit | Attending: Radiation Oncology | Admitting: Radiation Oncology

## 2023-03-09 ENCOUNTER — Other Ambulatory Visit: Payer: Self-pay

## 2023-03-09 DIAGNOSIS — Z510 Encounter for antineoplastic radiation therapy: Secondary | ICD-10-CM | POA: Diagnosis not present

## 2023-03-09 LAB — RAD ONC ARIA SESSION SUMMARY
Course Elapsed Days: 3
Plan Fractions Treated to Date: 4
Plan Prescribed Dose Per Fraction: 1.8 Gy
Plan Total Fractions Prescribed: 25
Plan Total Prescribed Dose: 45 Gy
Reference Point Dosage Given to Date: 7.2 Gy
Reference Point Session Dosage Given: 1.8 Gy
Session Number: 4

## 2023-03-10 ENCOUNTER — Ambulatory Visit
Admission: RE | Admit: 2023-03-10 | Discharge: 2023-03-10 | Disposition: A | Discharge status: Home / Self Care | Payer: Medicare HMO | Source: Ambulatory Visit | Attending: Radiation Oncology

## 2023-03-10 ENCOUNTER — Other Ambulatory Visit: Payer: Self-pay

## 2023-03-10 ENCOUNTER — Ambulatory Visit
Admission: RE | Admit: 2023-03-10 | Discharge: 2023-03-10 | Disposition: A | Discharge status: Home / Self Care | Payer: Medicare HMO | Source: Ambulatory Visit | Attending: Radiation Oncology | Admitting: Radiation Oncology

## 2023-03-10 DIAGNOSIS — Z510 Encounter for antineoplastic radiation therapy: Secondary | ICD-10-CM | POA: Diagnosis not present

## 2023-03-10 LAB — RAD ONC ARIA SESSION SUMMARY
Course Elapsed Days: 4
Plan Fractions Treated to Date: 5
Plan Prescribed Dose Per Fraction: 1.8 Gy
Plan Total Fractions Prescribed: 25
Plan Total Prescribed Dose: 45 Gy
Reference Point Dosage Given to Date: 9 Gy
Reference Point Session Dosage Given: 1.8 Gy
Session Number: 5

## 2023-03-13 ENCOUNTER — Other Ambulatory Visit: Payer: Self-pay

## 2023-03-13 ENCOUNTER — Ambulatory Visit
Admission: RE | Admit: 2023-03-13 | Discharge: 2023-03-13 | Disposition: A | Discharge status: Home / Self Care | Payer: Medicare HMO | Source: Ambulatory Visit | Attending: Radiation Oncology | Admitting: Radiation Oncology

## 2023-03-13 DIAGNOSIS — Z510 Encounter for antineoplastic radiation therapy: Secondary | ICD-10-CM | POA: Diagnosis not present

## 2023-03-13 LAB — RAD ONC ARIA SESSION SUMMARY
Course Elapsed Days: 7
Plan Fractions Treated to Date: 6
Plan Prescribed Dose Per Fraction: 1.8 Gy
Plan Total Fractions Prescribed: 25
Plan Total Prescribed Dose: 45 Gy
Reference Point Dosage Given to Date: 10.8 Gy
Reference Point Session Dosage Given: 1.8 Gy
Session Number: 6

## 2023-03-14 ENCOUNTER — Ambulatory Visit: Payer: Medicare HMO | Attending: Cardiology | Admitting: Cardiology

## 2023-03-14 ENCOUNTER — Ambulatory Visit
Admission: RE | Admit: 2023-03-14 | Discharge: 2023-03-14 | Disposition: A | Discharge status: Home / Self Care | Payer: Medicare HMO | Source: Ambulatory Visit | Attending: Radiation Oncology

## 2023-03-14 ENCOUNTER — Other Ambulatory Visit (INDEPENDENT_AMBULATORY_CARE_PROVIDER_SITE_OTHER): Payer: Medicare HMO

## 2023-03-14 ENCOUNTER — Other Ambulatory Visit: Payer: Self-pay

## 2023-03-14 ENCOUNTER — Encounter: Payer: Self-pay | Admitting: Cardiology

## 2023-03-14 VITALS — BP 120/66 | HR 97 | Ht 69.0 in | Wt 234.0 lb

## 2023-03-14 DIAGNOSIS — R55 Syncope and collapse: Secondary | ICD-10-CM | POA: Diagnosis not present

## 2023-03-14 DIAGNOSIS — R931 Abnormal findings on diagnostic imaging of heart and coronary circulation: Secondary | ICD-10-CM

## 2023-03-14 DIAGNOSIS — R079 Chest pain, unspecified: Secondary | ICD-10-CM | POA: Diagnosis not present

## 2023-03-14 DIAGNOSIS — I422 Other hypertrophic cardiomyopathy: Secondary | ICD-10-CM

## 2023-03-14 DIAGNOSIS — I493 Ventricular premature depolarization: Secondary | ICD-10-CM

## 2023-03-14 DIAGNOSIS — I10 Essential (primary) hypertension: Secondary | ICD-10-CM | POA: Diagnosis not present

## 2023-03-14 DIAGNOSIS — E785 Hyperlipidemia, unspecified: Secondary | ICD-10-CM

## 2023-03-14 DIAGNOSIS — Z510 Encounter for antineoplastic radiation therapy: Secondary | ICD-10-CM | POA: Diagnosis not present

## 2023-03-14 LAB — RAD ONC ARIA SESSION SUMMARY
Course Elapsed Days: 8
Plan Fractions Treated to Date: 7
Plan Prescribed Dose Per Fraction: 1.8 Gy
Plan Total Fractions Prescribed: 25
Plan Total Prescribed Dose: 45 Gy
Reference Point Dosage Given to Date: 12.6 Gy
Reference Point Session Dosage Given: 1.8 Gy
Session Number: 7

## 2023-03-14 MED ORDER — FUROSEMIDE 40 MG PO TABS: ORAL_TABLET | ORAL | 3 refills | Status: DC

## 2023-03-14 MED ORDER — METOPROLOL TARTRATE 100 MG PO TABS: 100.0000 mg | ORAL_TABLET | Freq: Once | ORAL | 0 refills | Status: DC

## 2023-03-14 MED ORDER — AMLODIPINE BESYLATE 5 MG PO TABS: 5.0000 mg | ORAL_TABLET | Freq: Two times a day (BID) | ORAL | 3 refills | Status: DC

## 2023-03-14 NOTE — Progress Notes (Signed)
Cardiology Office Note:  .   Date:  03/14/2023  ID:  Joseph Harvey, DOB 1955/06/12, MRN [id number] PCP: Joseph Martes, NP  [facility] Providers Cardiologist:  Joseph Ishikawa, MD Electrophysiologist:  Joseph Jorja Loa, MD  History of Present Illness: .   Joseph Harvey is a 67 y.o. male with a past medical history of prostate cancer, diabetes, hypertension, hyperlipidemia, tobacco use, hypertrophic cardiomyopathy.  Patient is followed by Dr. Gaynelle Harvey and presents today as an add-on appointment.  Patient previously underwent echocardiogram in 03/2020 that showed severe septal hypertrophy, no regional wall motion abnormalities, EF 60-65%, grade 1 diastolic dysfunction, normal RV function.  Later underwent CT cardiac scoring in 04/2020 that showed a coronary calcium score of 117 (81st percentile).  He underwent nuclear stress test that was a normal, low risk study with EF 55-65%.  Cardiac monitor in 04/2020 that showed 7 episodes of NSVT, longest lasting 3 beats, occasional PVCs.  He underwent cardiac MRI for evaluation of hypertrophic cardiomyopathy on 06/22/2020 that showed asymmetric hypertrophy measuring up to 20 mm and basal anterior septum consistent with hypertrophic cardiomyopathy.  Also found to have patchy LGE in the basal septum and RV insertion sites consistent with HCM.  LGE accounted for 11% of total myocardial mass.  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  He was already on max dose of nebivolol.  He was referred to EP.  Underwent echocardiogram on 04/14/2021 that showed EF 60-65%, no regional wall motion abnormalities, severe asymmetric LVH, no LVOT obstruction, grade 1 DD, normal RV function.  He underwent PVC ablation on 08/10/2021 with Dr. Elberta Harvey.  Follow-up long-term monitor in 09/2021 showed occasional PVCs with only 1.6% burden.  Most recent echocardiogram from 09/05/2022 showed EF greater than 75%, no regional wall motion  abnormalities, moderate asymmetric LVH, grade 1 DD, normal RV function.  Patient was last seen by Dr. Gaynelle Harvey on 01/10/2023.  At that time, patient reported he had been doing okay.  He was taking Lasix 40 mg about twice per week and was euvolemic.  He complained of intermittent chest pain and continued to smoke about 10 cigarettes/day.  A coronary CT was ordered, but has not yet been completed.  He remained on Lasix as needed, Toprol XL 100 mg daily.  Was also on amlodipine 5 mg daily, valsartan 320 mg daily for blood pressure, and rosuvastatin 40 mg daily and Zetia 10 mg daily for cholesterol.  He reported compliance with CPAP.  Today, patient reports that he was initially scheduled to see Joseph Harvey later this week. However, yesterday he had an episode of near syncope so he wanted to be seen sooner. Reports that yesterday he was standing and talking to a friend when he started to feel like he was going to pass out. Reports feeling like "his blood stopped moving". Get a bit dizzy and felt his vision going a bit dark. Leaned up again a table and started to feel better. After about 2 minutes, he was back to normal. Denies palpitations, chest pain during the episode. Admits that he had not eaten that day prior to his episode.   Patient also reports having recurrent chest pain. Chest pain feels like "indigestion" and tends to worsen after a bit meal. However, he also notes that symptoms occur on exertion. A coronary CTA has been ordered, but patient has not yet gotten the study scheduled. Reports that the copay is going to be $120, and he feels like he cannot  afford that with his other medical conditions. He is being treated for prostate cancer as well.   Patient denies palpitations, ankle edema, shortness of breath, palpitations. Takes lasix as needed, and reports that it controls his fluid well.    ROS: Per HPI   Studies Reviewed: .   Cardiac Studies & Procedures     STRESS TESTS  MYOCARDIAL  PERFUSION IMAGING 04/29/2020  Interpretation Summary  The left ventricular ejection fraction is normal (55-65%).  Nuclear stress EF: 57%.  There was no ST segment deviation noted during stress.  Negative stress test  The study is normal.  This is a low risk study.   ECHOCARDIOGRAM  ECHOCARDIOGRAM COMPLETE 09/05/2022  Narrative ECHOCARDIOGRAM REPORT    Patient Name:   Joseph Harvey  Date of Exam: 09/05/2022 Medical Rec #:  [id number]     Height:       69.0 in Accession #:    [id number]    Weight:       247.8 lb Date of Birth:  03-Aug-1955     BSA:          2.263 m Patient Age:    66 years      BP:           120/72 mmHg Patient Gender: M             HR:           92 bpm. Exam Location:  [address]  Procedure: 2D Echo, Cardiac Doppler, Color Doppler and Intracardiac Opacification Agent  Indications:    I42.1 Cardiomyopathy - Hypertrophic; R06.00 Dyspnea  History:        Patient has prior history of Echocardiogram examinations, most recent 04/14/2021. Arrythmias:PVC; Risk Factors:Hypertension, Diabetes, Dyslipidemia and Current Smoker. Dyspnea on exertion. Asthma. Morbid obesity. Aortic atherosclerosis.  Sonographer:    Joseph Harvey: [id number] Joseph Harvey  IMPRESSIONS   1. Vigorous LV function with near mid cavity obliteration during systole. With Valsalva, gradient increased to 32 mm Hg.Marland Kitchen Left ventricular ejection fraction, by estimation, is >75%. The left ventricle has hyperdynamic function. The left ventricle has no regional wall motion abnormalities. There is moderate asymmetric left ventricular hypertrophy. Left ventricular diastolic parameters are consistent with Grade I diastolic dysfunction (impaired relaxation). 2. Right ventricular systolic function is normal. The right ventricular size is normal. 3. The mitral valve is normal in structure. Trivial mitral valve regurgitation. 4. The aortic valve is tricuspid. Aortic valve  regurgitation is not visualized.  Comparison(s): The left ventricular function is unchanged.  FINDINGS Left Ventricle: Vigorous LV function with near mid cavity obliteration during systole. With Valsalva, gradient increased to 32 mm Hg. Left ventricular ejection fraction, by estimation, is >75%. The left ventricle has hyperdynamic function. The left ventricle has no regional wall motion abnormalities. The left ventricular internal cavity size was normal in size. There is moderate asymmetric left ventricular hypertrophy. Left ventricular diastolic parameters are consistent with Grade I diastolic dysfunction (impaired relaxation).  Right Ventricle: The right ventricular size is normal. Right vetricular wall thickness was not assessed. Right ventricular systolic function is normal.  Left Atrium: Left atrial size was normal in size.  Right Atrium: Right atrial size was normal in size.  Pericardium: Trivial pericardial effusion is present.  Mitral Valve: The mitral valve is normal in structure. Trivial mitral valve regurgitation.  Tricuspid Valve: The tricuspid valve is normal in structure. Tricuspid valve regurgitation is trivial.  Aortic Valve: The aortic valve is tricuspid. Aortic valve regurgitation is  afford that with his other medical conditions. He is being treated for prostate cancer as well.   Patient denies palpitations, ankle edema, shortness of breath, palpitations. Takes lasix as needed, and reports that it controls his fluid well.    ROS: Per HPI   Studies Reviewed: .   Cardiac Studies & Procedures     STRESS TESTS  MYOCARDIAL  PERFUSION IMAGING 04/29/2020  Interpretation Summary  The left ventricular ejection fraction is normal (55-65%).  Nuclear stress EF: 57%.  There was no ST segment deviation noted during stress.  Negative stress test  The study is normal.  This is a low risk study.   ECHOCARDIOGRAM  ECHOCARDIOGRAM COMPLETE 09/05/2022  Narrative ECHOCARDIOGRAM REPORT    Patient Name:   Joseph Harvey  Date of Exam: 09/05/2022 Medical Rec #:  [id number]     Height:       69.0 in Accession #:    [id number]    Weight:       247.8 lb Date of Birth:  03-Aug-1955     BSA:          2.263 m Patient Age:    66 years      BP:           120/72 mmHg Patient Gender: M             HR:           92 bpm. Exam Location:  [address]  Procedure: 2D Echo, Cardiac Doppler, Color Doppler and Intracardiac Opacification Agent  Indications:    I42.1 Cardiomyopathy - Hypertrophic; R06.00 Dyspnea  History:        Patient has prior history of Echocardiogram examinations, most recent 04/14/2021. Arrythmias:PVC; Risk Factors:Hypertension, Diabetes, Dyslipidemia and Current Smoker. Dyspnea on exertion. Asthma. Morbid obesity. Aortic atherosclerosis.  Sonographer:    Joseph Harvey: [id number] Joseph Harvey  IMPRESSIONS   1. Vigorous LV function with near mid cavity obliteration during systole. With Valsalva, gradient increased to 32 mm Hg.Marland Kitchen Left ventricular ejection fraction, by estimation, is >75%. The left ventricle has hyperdynamic function. The left ventricle has no regional wall motion abnormalities. There is moderate asymmetric left ventricular hypertrophy. Left ventricular diastolic parameters are consistent with Grade I diastolic dysfunction (impaired relaxation). 2. Right ventricular systolic function is normal. The right ventricular size is normal. 3. The mitral valve is normal in structure. Trivial mitral valve regurgitation. 4. The aortic valve is tricuspid. Aortic valve  regurgitation is not visualized.  Comparison(s): The left ventricular function is unchanged.  FINDINGS Left Ventricle: Vigorous LV function with near mid cavity obliteration during systole. With Valsalva, gradient increased to 32 mm Hg. Left ventricular ejection fraction, by estimation, is >75%. The left ventricle has hyperdynamic function. The left ventricle has no regional wall motion abnormalities. The left ventricular internal cavity size was normal in size. There is moderate asymmetric left ventricular hypertrophy. Left ventricular diastolic parameters are consistent with Grade I diastolic dysfunction (impaired relaxation).  Right Ventricle: The right ventricular size is normal. Right vetricular wall thickness was not assessed. Right ventricular systolic function is normal.  Left Atrium: Left atrial size was normal in size.  Right Atrium: Right atrial size was normal in size.  Pericardium: Trivial pericardial effusion is present.  Mitral Valve: The mitral valve is normal in structure. Trivial mitral valve regurgitation.  Tricuspid Valve: The tricuspid valve is normal in structure. Tricuspid valve regurgitation is trivial.  Aortic Valve: The aortic valve is tricuspid. Aortic valve regurgitation is  afford that with his other medical conditions. He is being treated for prostate cancer as well.   Patient denies palpitations, ankle edema, shortness of breath, palpitations. Takes lasix as needed, and reports that it controls his fluid well.    ROS: Per HPI   Studies Reviewed: .   Cardiac Studies & Procedures     STRESS TESTS  MYOCARDIAL  PERFUSION IMAGING 04/29/2020  Interpretation Summary  The left ventricular ejection fraction is normal (55-65%).  Nuclear stress EF: 57%.  There was no ST segment deviation noted during stress.  Negative stress test  The study is normal.  This is a low risk study.   ECHOCARDIOGRAM  ECHOCARDIOGRAM COMPLETE 09/05/2022  Narrative ECHOCARDIOGRAM REPORT    Patient Name:   Joseph Harvey  Date of Exam: 09/05/2022 Medical Rec #:  [id number]     Height:       69.0 in Accession #:    [id number]    Weight:       247.8 lb Date of Birth:  03-Aug-1955     BSA:          2.263 m Patient Age:    66 years      BP:           120/72 mmHg Patient Gender: M             HR:           92 bpm. Exam Location:  [address]  Procedure: 2D Echo, Cardiac Doppler, Color Doppler and Intracardiac Opacification Agent  Indications:    I42.1 Cardiomyopathy - Hypertrophic; R06.00 Dyspnea  History:        Patient has prior history of Echocardiogram examinations, most recent 04/14/2021. Arrythmias:PVC; Risk Factors:Hypertension, Diabetes, Dyslipidemia and Current Smoker. Dyspnea on exertion. Asthma. Morbid obesity. Aortic atherosclerosis.  Sonographer:    Joseph Harvey: [id number] Joseph Harvey  IMPRESSIONS   1. Vigorous LV function with near mid cavity obliteration during systole. With Valsalva, gradient increased to 32 mm Hg.Marland Kitchen Left ventricular ejection fraction, by estimation, is >75%. The left ventricle has hyperdynamic function. The left ventricle has no regional wall motion abnormalities. There is moderate asymmetric left ventricular hypertrophy. Left ventricular diastolic parameters are consistent with Grade I diastolic dysfunction (impaired relaxation). 2. Right ventricular systolic function is normal. The right ventricular size is normal. 3. The mitral valve is normal in structure. Trivial mitral valve regurgitation. 4. The aortic valve is tricuspid. Aortic valve  regurgitation is not visualized.  Comparison(s): The left ventricular function is unchanged.  FINDINGS Left Ventricle: Vigorous LV function with near mid cavity obliteration during systole. With Valsalva, gradient increased to 32 mm Hg. Left ventricular ejection fraction, by estimation, is >75%. The left ventricle has hyperdynamic function. The left ventricle has no regional wall motion abnormalities. The left ventricular internal cavity size was normal in size. There is moderate asymmetric left ventricular hypertrophy. Left ventricular diastolic parameters are consistent with Grade I diastolic dysfunction (impaired relaxation).  Right Ventricle: The right ventricular size is normal. Right vetricular wall thickness was not assessed. Right ventricular systolic function is normal.  Left Atrium: Left atrial size was normal in size.  Right Atrium: Right atrial size was normal in size.  Pericardium: Trivial pericardial effusion is present.  Mitral Valve: The mitral valve is normal in structure. Trivial mitral valve regurgitation.  Tricuspid Valve: The tricuspid valve is normal in structure. Tricuspid valve regurgitation is trivial.  Aortic Valve: The aortic valve is tricuspid. Aortic valve regurgitation is  afford that with his other medical conditions. He is being treated for prostate cancer as well.   Patient denies palpitations, ankle edema, shortness of breath, palpitations. Takes lasix as needed, and reports that it controls his fluid well.    ROS: Per HPI   Studies Reviewed: .   Cardiac Studies & Procedures     STRESS TESTS  MYOCARDIAL  PERFUSION IMAGING 04/29/2020  Interpretation Summary  The left ventricular ejection fraction is normal (55-65%).  Nuclear stress EF: 57%.  There was no ST segment deviation noted during stress.  Negative stress test  The study is normal.  This is a low risk study.   ECHOCARDIOGRAM  ECHOCARDIOGRAM COMPLETE 09/05/2022  Narrative ECHOCARDIOGRAM REPORT    Patient Name:   Joseph Harvey  Date of Exam: 09/05/2022 Medical Rec #:  [id number]     Height:       69.0 in Accession #:    [id number]    Weight:       247.8 lb Date of Birth:  03-Aug-1955     BSA:          2.263 m Patient Age:    66 years      BP:           120/72 mmHg Patient Gender: M             HR:           92 bpm. Exam Location:  [address]  Procedure: 2D Echo, Cardiac Doppler, Color Doppler and Intracardiac Opacification Agent  Indications:    I42.1 Cardiomyopathy - Hypertrophic; R06.00 Dyspnea  History:        Patient has prior history of Echocardiogram examinations, most recent 04/14/2021. Arrythmias:PVC; Risk Factors:Hypertension, Diabetes, Dyslipidemia and Current Smoker. Dyspnea on exertion. Asthma. Morbid obesity. Aortic atherosclerosis.  Sonographer:    Joseph Harvey: [id number] Joseph Harvey  IMPRESSIONS   1. Vigorous LV function with near mid cavity obliteration during systole. With Valsalva, gradient increased to 32 mm Hg.Marland Kitchen Left ventricular ejection fraction, by estimation, is >75%. The left ventricle has hyperdynamic function. The left ventricle has no regional wall motion abnormalities. There is moderate asymmetric left ventricular hypertrophy. Left ventricular diastolic parameters are consistent with Grade I diastolic dysfunction (impaired relaxation). 2. Right ventricular systolic function is normal. The right ventricular size is normal. 3. The mitral valve is normal in structure. Trivial mitral valve regurgitation. 4. The aortic valve is tricuspid. Aortic valve  regurgitation is not visualized.  Comparison(s): The left ventricular function is unchanged.  FINDINGS Left Ventricle: Vigorous LV function with near mid cavity obliteration during systole. With Valsalva, gradient increased to 32 mm Hg. Left ventricular ejection fraction, by estimation, is >75%. The left ventricle has hyperdynamic function. The left ventricle has no regional wall motion abnormalities. The left ventricular internal cavity size was normal in size. There is moderate asymmetric left ventricular hypertrophy. Left ventricular diastolic parameters are consistent with Grade I diastolic dysfunction (impaired relaxation).  Right Ventricle: The right ventricular size is normal. Right vetricular wall thickness was not assessed. Right ventricular systolic function is normal.  Left Atrium: Left atrial size was normal in size.  Right Atrium: Right atrial size was normal in size.  Pericardium: Trivial pericardial effusion is present.  Mitral Valve: The mitral valve is normal in structure. Trivial mitral valve regurgitation.  Tricuspid Valve: The tricuspid valve is normal in structure. Tricuspid valve regurgitation is trivial.  Aortic Valve: The aortic valve is tricuspid. Aortic valve regurgitation is

## 2023-03-14 NOTE — Patient Instructions (Signed)
Medication Instructions:  Increase Lasix to 40 mg as needed *If you need a refill on your cardiac medications before your next appointment, please call your pharmacy*   Lab Work: Today: BMP and CBC If you have labs (blood work) drawn today and your tests are completely normal, you will receive your results only by: MyChart Message (if you have MyChart) OR A paper copy in the mail If you have any lab test that is abnormal or we need to change your treatment, we will call you to review the results.   Testing/Procedures:    Your cardiac CT will be scheduled at one of the below locations:   [facility] [address] [place], [place] [address] [phone]  OR  [facility] [address] [place], [place] [address] [phone]  OR   [facility] [address], [place] [address] [phone]  If scheduled at [facility], please arrive at the [facility] and Children's Entrance (Entrance C2) of [facility] 30 minutes prior to test start time. You can use the FREE valet parking offered at entrance C (encouraged to control the heart rate for the test)  Proceed to the [facility] Radiology Department (first floor) to check-in and test prep.  All radiology patients and guests should use entrance C2 at [facility], accessed from [facility], even though the hospital's physical address listed is [address]    If scheduled at [facility] or [facility], please arrive 15 mins early for check-in and test prep.  There is spacious parking and easy access to the radiology department from the [facility] Heart and Vascular entrance. Please enter here and check-in with the desk attendant.   Please follow these instructions carefully (unless otherwise directed):  An IV will be required for this test and  Nitroglycerin will be given.  Hold all erectile dysfunction medications at least 3 days (72 hrs) prior to test. (Ie viagra, cialis, sildenafil, tadalafil, etc)   On the Night Before the Test: Be sure to Drink plenty of water. Do not consume any caffeinated/decaffeinated beverages or chocolate 12 hours prior to your test. Do not take any antihistamines 12 hours prior to your test.  On the Day of the Test: Drink plenty of water until 1 hour prior to the test. Do not eat any food 1 hour prior to test. You may take your regular medications prior to the test.  Take metoprolol (Lopressor) two hours prior to test. If you take Furosemide/Hydrochlorothiazide/Spironolactone, please HOLD on the morning of the test.       After the Test: Drink plenty of water. After receiving IV contrast, you may experience a mild flushed feeling. This is normal. On occasion, you may experience a mild rash up to 24 hours after the test. This is not dangerous. If this occurs, you can take Benadryl 25 mg and increase your fluid intake. If you experience trouble breathing, this can be serious. If it is severe call 911 IMMEDIATELY. If it is mild, please call our office. If you take any of these medications: Glipizide/Metformin, Avandament, Glucavance, please do not take 48 hours after completing test unless otherwise instructed.  We will call to schedule your test 2-4 weeks out understanding that some insurance companies will need an authorization prior to the service being performed.   For more information and frequently asked questions, please visit our website : [email or web address]  For non-scheduling related questions, please contact the cardiac imaging nurse navigator should you have any questions/concerns: Cardiac Imaging Nurse Navigators Direct Office Dial: [phone]   For scheduling needs, including cancellations and rescheduling, please call [place], [phone].    Follow-Up: At [facility], you and your health needs are our priority.  As part of our continuing mission to provide you with exceptional heart care, we have created designated Provider Care Teams.  These Care Teams include your primary Cardiologist (physician) and Advanced Practice Providers (APPs -  Physician Assistants and Nurse Practitioners) who all work together to provide you with the care you need, when you need it.  We recommend signing up for the patient portal called "MyChart".  Sign up information is provided on this After Visit Summary.  MyChart is used to connect with patients for Virtual Visits (Telemedicine).  Patients are able to view lab/test results, encounter notes, upcoming appointments, etc.  Non-urgent messages can be sent to your provider as well.   To learn more about what you can do with MyChart, go to [email or web address].    Your next appointment:   3 month(s)  Provider:   [name], MD

## 2023-03-14 NOTE — Progress Notes (Unsigned)
Enrolled patient for a 14 day Zio AT monitor to be mailed to patients home  Joseph Harvey to read

## 2023-03-15 ENCOUNTER — Ambulatory Visit
Admission: RE | Admit: 2023-03-15 | Discharge: 2023-03-15 | Disposition: A | Discharge status: Home / Self Care | Payer: Medicare HMO | Source: Ambulatory Visit | Attending: Radiation Oncology | Admitting: Radiation Oncology

## 2023-03-15 ENCOUNTER — Other Ambulatory Visit: Payer: Self-pay

## 2023-03-15 DIAGNOSIS — Z510 Encounter for antineoplastic radiation therapy: Secondary | ICD-10-CM | POA: Diagnosis not present

## 2023-03-15 LAB — BASIC METABOLIC PANEL
BUN/Creatinine Ratio: 14 (ref 10–24)
BUN: 14 mg/dL (ref 8–27)
CO2: 25 mmol/L (ref 20–29)
Calcium: 10.2 mg/dL (ref 8.6–10.2)
Chloride: 99 mmol/L (ref 96–106)
Creatinine, Ser: 1.01 mg/dL (ref 0.76–1.27)
Glucose: 117 mg/dL — ABNORMAL HIGH (ref 70–99)
Potassium: 4 mmol/L (ref 3.5–5.2)
Sodium: 141 mmol/L (ref 134–144)
eGFR: 82 mL/min/{1.73_m2} (ref >59)

## 2023-03-15 LAB — CBC
Hematocrit: 43.2 % (ref 37.5–51.0)
Hemoglobin: 14.6 g/dL (ref 13.0–17.7)
MCH: 31.3 pg (ref 26.6–33.0)
MCHC: 33.8 g/dL (ref 31.5–35.7)
MCV: 93 fL (ref 79–97)
Platelets: 379 10*3/uL (ref 150–450)
RBC: 4.67 x10E6/uL (ref 4.14–5.80)
RDW: 12.1 % (ref 11.6–15.4)
WBC: 5.3 10*3/uL (ref 3.4–10.8)

## 2023-03-15 LAB — RAD ONC ARIA SESSION SUMMARY
Course Elapsed Days: 9
Plan Fractions Treated to Date: 8
Plan Prescribed Dose Per Fraction: 1.8 Gy
Plan Total Fractions Prescribed: 25
Plan Total Prescribed Dose: 45 Gy
Reference Point Dosage Given to Date: 14.4 Gy
Reference Point Session Dosage Given: 1.8 Gy
Session Number: 8

## 2023-03-16 ENCOUNTER — Telehealth: Payer: Self-pay

## 2023-03-16 ENCOUNTER — Other Ambulatory Visit: Payer: Self-pay

## 2023-03-16 ENCOUNTER — Ambulatory Visit
Admission: RE | Admit: 2023-03-16 | Discharge: 2023-03-16 | Disposition: A | Discharge status: Home / Self Care | Payer: Medicare HMO | Source: Ambulatory Visit | Attending: Radiation Oncology | Admitting: Radiation Oncology

## 2023-03-16 DIAGNOSIS — Z510 Encounter for antineoplastic radiation therapy: Secondary | ICD-10-CM | POA: Diagnosis not present

## 2023-03-16 LAB — RAD ONC ARIA SESSION SUMMARY
Course Elapsed Days: 10
Plan Fractions Treated to Date: 9
Plan Prescribed Dose Per Fraction: 1.8 Gy
Plan Total Fractions Prescribed: 25
Plan Total Prescribed Dose: 45 Gy
Reference Point Dosage Given to Date: 16.2 Gy
Reference Point Session Dosage Given: 1.8 Gy
Session Number: 9

## 2023-03-16 NOTE — Telephone Encounter (Signed)
-----   Message from Jonita Albee sent at 03/15/2023  1:30 PM EDT ----- Please tell patient that labwork yesterday showed normal kidney function, normal electrolytes. Normal WBC, RBC, hemoglobin. Overall, nothing in lab work to suggest a cause of dizziness. No changes to treatment plan at this time   Thanks KJ

## 2023-03-16 NOTE — Telephone Encounter (Signed)
Left message to call back  

## 2023-03-16 NOTE — Telephone Encounter (Signed)
Patient was returning call. Please advise ?

## 2023-03-17 ENCOUNTER — Other Ambulatory Visit: Payer: Self-pay

## 2023-03-17 ENCOUNTER — Ambulatory Visit
Admission: RE | Admit: 2023-03-17 | Discharge: 2023-03-17 | Disposition: A | Discharge status: Home / Self Care | Payer: Medicare HMO | Source: Ambulatory Visit | Attending: Radiation Oncology | Admitting: Radiation Oncology

## 2023-03-17 ENCOUNTER — Ambulatory Visit: Payer: Medicare HMO | Admitting: Cardiology

## 2023-03-17 DIAGNOSIS — R9721 Rising PSA following treatment for malignant neoplasm of prostate: Secondary | ICD-10-CM | POA: Insufficient documentation

## 2023-03-17 DIAGNOSIS — Z510 Encounter for antineoplastic radiation therapy: Secondary | ICD-10-CM | POA: Insufficient documentation

## 2023-03-17 DIAGNOSIS — C61 Malignant neoplasm of prostate: Secondary | ICD-10-CM | POA: Diagnosis not present

## 2023-03-17 DIAGNOSIS — R55 Syncope and collapse: Secondary | ICD-10-CM | POA: Diagnosis not present

## 2023-03-17 LAB — RAD ONC ARIA SESSION SUMMARY
Course Elapsed Days: 11
Plan Fractions Treated to Date: 10
Plan Prescribed Dose Per Fraction: 1.8 Gy
Plan Total Fractions Prescribed: 25
Plan Total Prescribed Dose: 45 Gy
Reference Point Dosage Given to Date: 18 Gy
Reference Point Session Dosage Given: 1.8 Gy
Session Number: 10

## 2023-03-20 ENCOUNTER — Ambulatory Visit
Admission: RE | Admit: 2023-03-20 | Discharge: 2023-03-20 | Disposition: A | Discharge status: Home / Self Care | Payer: Medicare HMO | Source: Ambulatory Visit | Attending: Radiation Oncology

## 2023-03-20 ENCOUNTER — Encounter: Payer: Self-pay | Admitting: Pulmonary Disease

## 2023-03-20 ENCOUNTER — Other Ambulatory Visit: Payer: Self-pay

## 2023-03-20 ENCOUNTER — Ambulatory Visit: Payer: Medicare HMO | Admitting: Pulmonary Disease

## 2023-03-20 VITALS — BP 133/73 | HR 99 | Ht 69.0 in | Wt 237.0 lb

## 2023-03-20 DIAGNOSIS — Z510 Encounter for antineoplastic radiation therapy: Secondary | ICD-10-CM | POA: Diagnosis not present

## 2023-03-20 DIAGNOSIS — J301 Allergic rhinitis due to pollen: Secondary | ICD-10-CM

## 2023-03-20 DIAGNOSIS — J4550 Severe persistent asthma, uncomplicated: Secondary | ICD-10-CM

## 2023-03-20 LAB — RAD ONC ARIA SESSION SUMMARY
Course Elapsed Days: 14
Plan Fractions Treated to Date: 11
Plan Prescribed Dose Per Fraction: 1.8 Gy
Plan Total Fractions Prescribed: 25
Plan Total Prescribed Dose: 45 Gy
Reference Point Dosage Given to Date: 19.8 Gy
Reference Point Session Dosage Given: 1.8 Gy
Session Number: 11

## 2023-03-20 MED ORDER — IPRATROPIUM BROMIDE 0.06 % NA SOLN: 2.0000 | Freq: Three times a day (TID) | NASAL | 11 refills | Status: DC

## 2023-03-20 NOTE — Progress Notes (Signed)
Patient ID: Joseph Harvey, male    DOB: [birth year], 67 y.o.   MRN: [id number]  No chief complaint on file.   Referring provider: Cristino Martes, NP  HPI:   Joseph Harvey is a 67 y.o. man whom we are seeing in follow-up for dyspnea exertion, asthma.  Most recent cardiology note reviewed.  Most recent radiation oncology note reviewed.  Recurrent exacerbations in the past.  Discussed multiple prednisone doses.  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  First injection 01/04/2023. Has helped. PRN prednisone use and nebulizer use has decreased.  In the interim he has started radiation therapy for PSA level in the setting of prior prostate cancer.  HPI at initial visit: Patient formerly seen by Joseph Harvey last in 2016.  At last visit symptoms are well controlled with Symbicort.  Unclear exactly what occurred in the last 5 years.  But over the last several months he endorses worsening dyspnea on exertion.  Just walking around going to the gym he has become short of breath.  Endorses severe shortness of breath.  This is resolved within a minute or 2 of albuterol administration.  In general his cough is much better than prior.  Suspect he is adhered well to Joseph Harvey instructions regarding his airway cough syndrome.  He does feel like he has significant nasal congestion and postnasal drip.  This produces mucus in the back of her throat needs to clear her cough up but feels much different than his prior cough.  Scheduled to have sinus surgery but this was discontinued due to wheezing per his report.  His insurance is changed and now needs to find a new ENT doctor.  When he gets bad bouts of that mucus buildup he will take a dose of prednisone and within a minute, instantly the mucus feels better.  In terms of his dyspnea, rest improves his dyspnea.  There is no other aggravating or alleviating factors.  He has been on Breo for the last  several months and says he thinks it helps somewhat with his breathing but is not at the level it was when he was followed by pulmonary prior.  Prior PFTs reviewed and interpreted as suggestive of mild restriction on spirometry, no fixed obstruction, lung volumes revealed TLC of 86% predicted, within normal limits or mildly reduced.  DLCO within normal limits.  Most recent chest x-ray 06/2018 reviewed instructed is clear lungs, current hyperinflation on PA film does not appear hyperinflated on lateral film.  PMH: Anxiety, obesity, diabetes, tobacco abuse, prostate cancer Surgical history: Lumbar back surgery Family History: Father with colon and lung cancer, mother with lung cancer Social history: Grew up in [place], lived there for 35 years, current smoker, 20+ pack year history   Questionaires / Pulmonary Flowsheets:   ACT:  Asthma Control Test ACT Total Score  04/18/2022  8:32 AM 17  03/10/2021  9:51 AM 13  02/26/2020  9:23 AM 9    MMRC:     No data to display          Epworth:      No data to display          Tests:   FENO:  No results found for: "NITRICOXIDE"  PFT:    Latest Ref Rng & Units 05/26/2021    8:14 AM 02/28/2020   10:53 AM  PFT Results  FVC-Pre L 2.31  2.06   FVC-Predicted Pre % 59  52  FVC-Post L 2.28  2.30   FVC-Predicted Post % 59  59   Pre FEV1/FVC % % 73  70   Post FEV1/FCV % % 72  74   FEV1-Pre L 1.69  1.43   FEV1-Predicted Pre % 56  47   FEV1-Post L 1.65  1.70   DLCO uncorrected ml/min/mmHg 19.07  21.37   DLCO UNC% % 71  80   DLCO corrected ml/min/mmHg 19.07  22.15   DLCO COR %Predicted % 71  82   DLVA Predicted % 99  123   Personally reviewed and interpreted as mixed restrictive and obstructive physiology with gas trapping, normal DLCO, repeat in 05/2021 showed no longer bronchodilator response likely consistent with better controlled asthma, spirometry overall stable  WALK:      No data to display           Imaging: Reviewed as per EMR  Lab Results: Personally reviewed, no significant elevation of eosinophils, IgE and RAST panel negative in past CBC    Component Value Date/Time   WBC 5.3 03/14/2023 1238   WBC 9.8 09/23/2022 1045   RBC 4.67 03/14/2023 1238   RBC 4.31 09/23/2022 1045   HGB 14.6 03/14/2023 1238   HCT 43.2 03/14/2023 1238   PLT 379 03/14/2023 1238   MCV 93 03/14/2023 1238   MCH 31.3 03/14/2023 1238   MCH 31.8 09/23/2022 1045   MCHC 33.8 03/14/2023 1238   MCHC 33.7 09/23/2022 1045   RDW 12.1 03/14/2023 1238   LYMPHSABS 3.2 02/26/2020 1007   MONOABS 0.7 02/26/2020 1007   EOSABS 0.1 02/26/2020 1007   BASOSABS 0.2 (H) 02/26/2020 1007    BMET    Component Value Date/Time   NA 141 03/14/2023 1238   K 4.0 03/14/2023 1238   CL 99 03/14/2023 1238   CO2 25 03/14/2023 1238   GLUCOSE 117 (H) 03/14/2023 1238   GLUCOSE 149 (H) 09/23/2022 1045   BUN 14 03/14/2023 1238   CREATININE 1.01 03/14/2023 1238   CALCIUM 10.2 03/14/2023 1238   GFRNONAA >60 09/23/2022 1045   GFRAA 68 04/14/2020 1036    BNP    Component Value Date/Time   BNP 85.9 09/23/2022 1421    ProBNP    Component Value Date/Time   PROBNP 336 (H) 09/16/2021 1422       Allergies  Allergen Reactions   Amoxicillin Anaphylaxis    Has patient had a PCN reaction causing immediate rash, facial/tongue/throat swelling, SOB or lightheadedness with hypotension: Yes Has patient had a PCN reaction causing severe rash involving mucus membranes or skin necrosis: No Has patient had a PCN reaction that required hospitalization: Yes Has patient had a PCN reaction occurring within the last 10 years: Yes If all of the above answers are "NO", then may proceed with Cephalosporin use.    Lisinopril Shortness Of Breath   Molds & Smuts Anaphylaxis   Robitussin [Guaifenesin] Shortness Of Breath    wheezing   Azithromycin Other (See Comments)    Immunization History  Administered Date(s) Administered   DTaP  03/16/2012   Fluzone Influenza virus vaccine,trivalent (IIV3), split virus 04/15/2012   Influenza Split 02/07/2013, 02/14/2015, 01/21/2016   Influenza Whole 04/15/2012, 02/10/2020   Influenza, High Dose Seasonal PF 01/28/2019, 12/23/2021   Influenza, Seasonal, Injecte, Preservative Fre 02/07/2013, 02/14/2015, 01/21/2016, 01/27/2017   Influenza,inj,Quad PF,6+ Mos 01/28/2019   Influenza,inj,Quad PF,6-35 Mos 02/08/2021   Influenza,trivalent, recombinat, inj, PF 02/07/2013, 02/14/2015   Influenza-Unspecified 02/07/2013, 02/07/2013, 02/14/2015, 02/14/2015, 01/21/2016, 01/21/2016, 12/22/2016, 12/22/2016, 01/27/2017, 01/27/2017  PFIZER(Purple Top)SARS-COV-2 Vaccination 07/29/2019, 08/19/2019, 01/07/2020, 03/17/2021   PNEUMOCOCCAL CONJUGATE-20 12/08/2021   Pneumococcal Conjugate-13 03/16/2015   Pneumococcal Polysaccharide-23 03/16/2012, 05/16/2013   Tdap 12/19/2016   Zoster Recombinant(Shingrix) 12/19/2016, 02/27/2017    Past Medical History:  Diagnosis Date   Allergy    Dust, mold, dust mites   Anemia    Anxiety    Asthma    Cancer (HCC)    prostate   Cataract    bilateral repair.   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   Pneumonia    Stress incontinence     Tobacco History: Social History   Tobacco Use  Smoking Status Every Day   Current packs/day: 0.75   Average packs/day: 0.8 packs/day for 48.8 years (36.6 ttl pk-yrs)   Types: Cigarettes   Start date: 1976  Smokeless Tobacco Never  Tobacco Comments   still smoking 0.5 ppd   Ready to quit: Not Answered Counseling given: Not Answered Tobacco comments: still smoking 0.5 ppd      Outpatient Encounter Medications as of 03/20/2023  Medication Sig   acetaminophen (TYLENOL) 325 MG tablet Take 2 tablets (650 mg total) by mouth every 4 (four) hours  as needed for headache or mild pain.   albuterol (PROVENTIL) (2.5 MG/3ML) 0.083% nebulizer solution TAKE 3 MLS BY NEBULIZATION EVERY 4 HOURS AS NEEDED FOR WHEEZING OR SHORTNESS OF BREATH (((PLAN B)))   albuterol (VENTOLIN HFA) 108 (90 Base) MCG/ACT inhaler Inhale 2 puffs into the lungs every 4 hours as needed for shortness of breath or wheezing   amLODipine (NORVASC) 5 MG tablet Take 1 tablet (5 mg total) by mouth in the morning and at bedtime.   ASPIRIN 81 PO Take 81 mg by mouth daily.   azithromycin (ZITHROMAX) 250 MG tablet Take 250 mg by mouth as directed.   Blood Glucose Monitoring Suppl (TRUE METRIX METER) w/Device KIT Use as directed   Budeson-Glycopyrrol-Formoterol (BREZTRI AEROSPHERE) 160-9-4.8 MCG/ACT AERO Inhale 2 puffs into the lungs in the morning and at bedtime.   busPIRone (BUSPAR) 10 MG tablet Take 1 tablet (10 mg total) by mouth 3 (three) times daily. (Patient taking differently: Take 10 mg by mouth 3 (three) times daily as needed (anxiety).)   Cholecalciferol (VITAMIN D PO) Take 1 tablet by mouth daily.   Dupilumab (DUPIXENT) 300 MG/2ML SOPN Inject 300 mg into the skin every 14 (fourteen) days. **loading dose completed in clinic on 12/15/22**   ezetimibe (ZETIA) 10 MG tablet Take 1 tablet (10 mg total) by mouth daily.   furosemide (LASIX) 40 MG tablet Take 40 mg daily if gains 3 lbs overnight or 5 lbs in 1 week   gabapentin (NEURONTIN) 300 MG capsule take 1 capsule by mouth in the morning, then take 1 at lunch and 2 at bedtime (Patient taking differently: Take 300 mg by mouth 3 (three) times daily as needed (Nerve pain).)   glucose blood (TRUE METRIX BLOOD GLUCOSE TEST) test strip Use as instructed   ipratropium (ATROVENT) 0.06 % nasal spray Place 2 sprays into both nostrils 3 (three) times daily. As needed for nasal congestion, runny nose   ipratropium-albuterol (DUONEB) 0.5-2.5 (3) MG/3ML SOLN INHALE 1 VIAL VIA NEBULIZER TWICE A DAY   lidocaine (LIDODERM) 5 % Apply 1 patch to  affected area daily and leave on  for 12 hours at a time.  Remove after 12 hours   linaclotide (LINZESS) 72 MCG capsule Take 1 capsule (72 mcg total) by mouth daily before breakfast.   metFORMIN (GLUCOPHAGE-XR) 500 MG 24 hr tablet Take 2 tablets (1,000 mg total) by mouth in the morning AND 1 tablet (500 mg total) every evening with meals.   montelukast (SINGULAIR) 10 MG tablet Take 1 tablet (10 mg total) by mouth at bedtime.   omeprazole (PRILOSEC) 40 MG capsule Take 40 mg by mouth every morning.   pantoprazole (PROTONIX) 40 MG tablet Take 1 tablet (40 mg total) by mouth daily as needed.   potassium chloride SA (KLOR-CON M) 20 MEQ tablet Take 1 tablet (20 mEq total) by mouth daily as needed (When hands cramp up).   predniSONE (DELTASONE) 10 MG tablet Take 3 tablets (30 mg total) by mouth daily as needed.   Respiratory Therapy Supplies (FLUTTER) DEVI Use three times a day after inhaler or nebulizer use   rosuvastatin (CRESTOR) 20 MG tablet Take 1 tablet (20 mg total) by mouth daily.   Semaglutide (RYBELSUS) 7 MG TABS Take 1 tablet by mouth daily.   silver sulfADIAZINE (SILVADENE) 1 % cream Apply twice a day to affected area   traMADol (ULTRAM) 50 MG tablet Take 1 tablet (50 mg total) by mouth every evening. Each prescription to last 1 mth   TRUEplus Lancets 28G MISC Use as directed   valsartan (DIOVAN) 320 MG tablet Take 1 tablet (320 mg total) by mouth daily.   [DISCONTINUED] ipratropium (ATROVENT) 0.06 % nasal spray PLACE 2 SPRAYS INTO BOTH NOSTRILS 3 (THREE) TIMES DAILY. AS NEEDED FOR NASAL CONGESTION, RUNNY NOSE   metoprolol succinate (TOPROL-XL) 100 MG 24 hr tablet Take 1 tablet (100 mg total) by mouth daily. Take with or immediately following a meal.   metoprolol tartrate (LOPRESSOR) 100 MG tablet Take 1 tablet (100 mg total) by mouth once for 1 dose.   No facility-administered encounter medications on file as of 03/20/2023.     Review of Systems  Review of Systems  N/a Physical  Exam  BP 133/73   Pulse 99   Ht 5\' 9"  (1.753 m)   Wt 237 lb (107.5 kg)   SpO2 96%   BMI 35.00 kg/m   Wt Readings from Last 5 Encounters:  03/20/23 237 lb (107.5 kg)  03/14/23 234 lb (106.1 kg)  02/16/23 237 lb 4 oz (107.6 kg)  01/10/23 240 lb 9.6 oz (109.1 kg)  11/29/22 244 lb (110.7 kg)    BMI Readings from Last 5 Encounters:  03/20/23 35.00 kg/m  03/14/23 34.56 kg/m  02/16/23 37.16 kg/m  01/10/23 35.53 kg/m  11/29/22 36.03 kg/m     Physical Exam General: Well-appearing, sitting up in exam chair Eyes: EOMI, icterus Neck: No JVP appreciated, neck supple, Respiratory: clear, no wheeze, NWOB  cardiovascular: Regular rate, regular rhythm, no murmurs Abdomen: Nondistended, bowel sounds present MSK: No joint effusion, no synovitis Neuro: Normal gait, no weakness Psych: Normal mood, full affect    Assessment & Plan:   Dyspnea on exertion: Suspect multifactorial.  Asthma as a contributor.   Further discussion as below regarding asthma.  Other contributors include suspected anxiety/hyperventilation syndrome, obesity, deconditioning, and volume overload.  Frothy sputum, orthopnea endorsed.  Suspect some of the recent worsening due to volume overload.  Improvement in symptoms, less rescue inhaler use, nebulizer use with increasing diuretic therapy recently.  Asthma without exacerbation: He has atopic symptoms.  Improved with triple inhaled therapy in  the past.  Intermittently has access appropriate triple inhaled therapies but intermittently too expensive, loses access.  Trelegy more recently, and improved symptoms.  Started Dupixent 12/2022 with mild improvement in symptoms.  Continue Trelegy, as needed albuterol, has small supply of prednisone on hand when needed.  Nasal congestion: Ipratropium nasal spray refilled today.  Has helped with nasal congestion.  Lung nodules: 83-month follow-up 10/2022 with resolution of lung nodule.  Return to yearly lung cancer screening  scans.   Return in about 3 months (around 06/20/2023) for f/u Dr. Judeth Horn.   Karren Burly, MD 03/20/2023

## 2023-03-20 NOTE — Telephone Encounter (Signed)
Called patient advised of below they verbalized understanding.

## 2023-03-20 NOTE — Patient Instructions (Signed)
Nice to see you again  I refilled the nasal spray  No other changes to medicines  Hopefully Dupixent will be working as maximum strength by Christmas  Return to clinic in 3 months or sooner as needed with Dr. Judeth Horn

## 2023-03-21 ENCOUNTER — Ambulatory Visit
Admission: RE | Admit: 2023-03-21 | Discharge: 2023-03-21 | Disposition: A | Discharge status: Home / Self Care | Payer: Medicare HMO | Source: Ambulatory Visit | Attending: Radiation Oncology

## 2023-03-21 ENCOUNTER — Other Ambulatory Visit: Payer: Self-pay

## 2023-03-21 DIAGNOSIS — Z510 Encounter for antineoplastic radiation therapy: Secondary | ICD-10-CM | POA: Diagnosis not present

## 2023-03-21 LAB — RAD ONC ARIA SESSION SUMMARY
Course Elapsed Days: 15
Plan Fractions Treated to Date: 12
Plan Prescribed Dose Per Fraction: 1.8 Gy
Plan Total Fractions Prescribed: 25
Plan Total Prescribed Dose: 45 Gy
Reference Point Dosage Given to Date: 21.6 Gy
Reference Point Session Dosage Given: 1.8 Gy
Session Number: 12

## 2023-03-22 ENCOUNTER — Ambulatory Visit
Admission: RE | Admit: 2023-03-22 | Discharge: 2023-03-22 | Disposition: A | Discharge status: Home / Self Care | Payer: Medicare HMO | Source: Ambulatory Visit | Attending: Radiation Oncology | Admitting: Radiation Oncology

## 2023-03-22 ENCOUNTER — Other Ambulatory Visit: Payer: Self-pay

## 2023-03-22 DIAGNOSIS — Z510 Encounter for antineoplastic radiation therapy: Secondary | ICD-10-CM | POA: Diagnosis not present

## 2023-03-22 LAB — RAD ONC ARIA SESSION SUMMARY
Course Elapsed Days: 16
Plan Fractions Treated to Date: 13
Plan Prescribed Dose Per Fraction: 1.8 Gy
Plan Total Fractions Prescribed: 25
Plan Total Prescribed Dose: 45 Gy
Reference Point Dosage Given to Date: 23.4 Gy
Reference Point Session Dosage Given: 1.8 Gy
Session Number: 13

## 2023-03-23 ENCOUNTER — Other Ambulatory Visit: Payer: Self-pay

## 2023-03-23 ENCOUNTER — Ambulatory Visit: Payer: Medicare HMO

## 2023-03-23 ENCOUNTER — Ambulatory Visit
Admission: RE | Admit: 2023-03-23 | Discharge: 2023-03-23 | Disposition: A | Discharge status: Home / Self Care | Payer: Medicare HMO | Source: Ambulatory Visit | Attending: Radiation Oncology | Admitting: Radiation Oncology

## 2023-03-23 DIAGNOSIS — Z510 Encounter for antineoplastic radiation therapy: Secondary | ICD-10-CM | POA: Diagnosis not present

## 2023-03-23 LAB — RAD ONC ARIA SESSION SUMMARY
Course Elapsed Days: 17
Plan Fractions Treated to Date: 14
Plan Prescribed Dose Per Fraction: 1.8 Gy
Plan Total Fractions Prescribed: 25
Plan Total Prescribed Dose: 45 Gy
Reference Point Dosage Given to Date: 25.2 Gy
Reference Point Session Dosage Given: 1.8 Gy
Session Number: 14

## 2023-03-24 ENCOUNTER — Ambulatory Visit: Admission: RE | Admit: 2023-03-24 | Payer: Medicare HMO | Source: Ambulatory Visit

## 2023-03-24 ENCOUNTER — Other Ambulatory Visit: Payer: Self-pay

## 2023-03-24 ENCOUNTER — Ambulatory Visit
Admission: RE | Admit: 2023-03-24 | Discharge: 2023-03-24 | Disposition: A | Discharge status: Home / Self Care | Payer: Medicare HMO | Source: Ambulatory Visit | Attending: Radiation Oncology | Admitting: Radiation Oncology

## 2023-03-24 DIAGNOSIS — Z510 Encounter for antineoplastic radiation therapy: Secondary | ICD-10-CM | POA: Diagnosis not present

## 2023-03-24 LAB — RAD ONC ARIA SESSION SUMMARY
Course Elapsed Days: 18
Plan Fractions Treated to Date: 15
Plan Prescribed Dose Per Fraction: 1.8 Gy
Plan Total Fractions Prescribed: 25
Plan Total Prescribed Dose: 45 Gy
Reference Point Dosage Given to Date: 27 Gy
Reference Point Session Dosage Given: 1.8 Gy
Session Number: 15

## 2023-03-25 ENCOUNTER — Other Ambulatory Visit: Payer: Self-pay | Admitting: Medical Genetics

## 2023-03-25 DIAGNOSIS — Z006 Encounter for examination for normal comparison and control in clinical research program: Secondary | ICD-10-CM

## 2023-03-27 ENCOUNTER — Other Ambulatory Visit: Payer: Self-pay

## 2023-03-27 ENCOUNTER — Ambulatory Visit
Admission: RE | Admit: 2023-03-27 | Discharge: 2023-03-27 | Disposition: A | Discharge status: Home / Self Care | Payer: Medicare HMO | Source: Ambulatory Visit | Attending: Radiation Oncology | Admitting: Radiation Oncology

## 2023-03-27 DIAGNOSIS — Z510 Encounter for antineoplastic radiation therapy: Secondary | ICD-10-CM | POA: Diagnosis not present

## 2023-03-27 LAB — RAD ONC ARIA SESSION SUMMARY
Course Elapsed Days: 21
Plan Fractions Treated to Date: 16
Plan Prescribed Dose Per Fraction: 1.8 Gy
Plan Total Fractions Prescribed: 25
Plan Total Prescribed Dose: 45 Gy
Reference Point Dosage Given to Date: 28.8 Gy
Reference Point Session Dosage Given: 1.8 Gy
Session Number: 16

## 2023-03-28 ENCOUNTER — Other Ambulatory Visit: Payer: Self-pay

## 2023-03-28 ENCOUNTER — Ambulatory Visit
Admission: RE | Admit: 2023-03-28 | Discharge: 2023-03-28 | Disposition: A | Discharge status: Home / Self Care | Payer: Medicare HMO | Source: Ambulatory Visit | Attending: Radiation Oncology | Admitting: Radiation Oncology

## 2023-03-28 DIAGNOSIS — Z510 Encounter for antineoplastic radiation therapy: Secondary | ICD-10-CM | POA: Diagnosis not present

## 2023-03-28 LAB — RAD ONC ARIA SESSION SUMMARY
Course Elapsed Days: 22
Plan Fractions Treated to Date: 17
Plan Prescribed Dose Per Fraction: 1.8 Gy
Plan Total Fractions Prescribed: 25
Plan Total Prescribed Dose: 45 Gy
Reference Point Dosage Given to Date: 30.6 Gy
Reference Point Session Dosage Given: 1.8 Gy
Session Number: 17

## 2023-03-29 ENCOUNTER — Other Ambulatory Visit: Payer: Self-pay

## 2023-03-29 ENCOUNTER — Ambulatory Visit
Admission: RE | Admit: 2023-03-29 | Discharge: 2023-03-29 | Disposition: A | Discharge status: Home / Self Care | Payer: Medicare HMO | Source: Ambulatory Visit | Attending: Radiation Oncology

## 2023-03-29 DIAGNOSIS — Z510 Encounter for antineoplastic radiation therapy: Secondary | ICD-10-CM | POA: Diagnosis not present

## 2023-03-29 LAB — RAD ONC ARIA SESSION SUMMARY
Course Elapsed Days: 23
Plan Fractions Treated to Date: 18
Plan Prescribed Dose Per Fraction: 1.8 Gy
Plan Total Fractions Prescribed: 25
Plan Total Prescribed Dose: 45 Gy
Reference Point Dosage Given to Date: 32.4 Gy
Reference Point Session Dosage Given: 1.8 Gy
Session Number: 18

## 2023-03-30 ENCOUNTER — Ambulatory Visit
Admission: RE | Admit: 2023-03-30 | Discharge: 2023-03-30 | Disposition: A | Discharge status: Home / Self Care | Payer: Medicare HMO | Source: Ambulatory Visit | Attending: Radiation Oncology | Admitting: Radiation Oncology

## 2023-03-30 ENCOUNTER — Other Ambulatory Visit: Payer: Self-pay

## 2023-03-30 DIAGNOSIS — Z510 Encounter for antineoplastic radiation therapy: Secondary | ICD-10-CM | POA: Diagnosis not present

## 2023-03-30 LAB — RAD ONC ARIA SESSION SUMMARY
Course Elapsed Days: 24
Plan Fractions Treated to Date: 19
Plan Prescribed Dose Per Fraction: 1.8 Gy
Plan Total Fractions Prescribed: 25
Plan Total Prescribed Dose: 45 Gy
Reference Point Dosage Given to Date: 34.2 Gy
Reference Point Session Dosage Given: 1.8 Gy
Session Number: 19

## 2023-03-31 ENCOUNTER — Ambulatory Visit
Admission: RE | Admit: 2023-03-31 | Discharge: 2023-03-31 | Disposition: A | Discharge status: Home / Self Care | Payer: Medicare HMO | Source: Ambulatory Visit | Attending: Radiation Oncology

## 2023-03-31 ENCOUNTER — Other Ambulatory Visit: Payer: Self-pay

## 2023-03-31 DIAGNOSIS — Z510 Encounter for antineoplastic radiation therapy: Secondary | ICD-10-CM | POA: Diagnosis not present

## 2023-03-31 LAB — RAD ONC ARIA SESSION SUMMARY
Course Elapsed Days: 25
Plan Fractions Treated to Date: 20
Plan Prescribed Dose Per Fraction: 1.8 Gy
Plan Total Fractions Prescribed: 25
Plan Total Prescribed Dose: 45 Gy
Reference Point Dosage Given to Date: 36 Gy
Reference Point Session Dosage Given: 1.8 Gy
Session Number: 20

## 2023-04-01 ENCOUNTER — Other Ambulatory Visit: Payer: Self-pay | Admitting: Internal Medicine

## 2023-04-03 ENCOUNTER — Ambulatory Visit
Admission: RE | Admit: 2023-04-03 | Discharge: 2023-04-03 | Disposition: A | Discharge status: Home / Self Care | Payer: Medicare HMO | Source: Ambulatory Visit | Attending: Radiation Oncology

## 2023-04-03 ENCOUNTER — Other Ambulatory Visit: Payer: Self-pay

## 2023-04-03 DIAGNOSIS — Z510 Encounter for antineoplastic radiation therapy: Secondary | ICD-10-CM | POA: Diagnosis not present

## 2023-04-03 LAB — RAD ONC ARIA SESSION SUMMARY
Course Elapsed Days: 28
Plan Fractions Treated to Date: 21
Plan Prescribed Dose Per Fraction: 1.8 Gy
Plan Total Fractions Prescribed: 25
Plan Total Prescribed Dose: 45 Gy
Reference Point Dosage Given to Date: 37.8 Gy
Reference Point Session Dosage Given: 1.8 Gy
Session Number: 21

## 2023-04-03 NOTE — Telephone Encounter (Signed)
Requested medications are due for refill today.  yes  Requested medications are on the active medications list.  yes  Last refill. 01/11/2022 #30 3 rf  Future visit scheduled.   no  Notes to clinic.  Cristino Martes listed as pcp.     Requested Prescriptions  Pending Prescriptions Disp Refills   montelukast (SINGULAIR) 10 MG tablet 30 tablet 3    Sig: Take 1 tablet (10 mg total) by mouth at bedtime.     Pulmonology:  Leukotriene Inhibitors Passed - 04/01/2023  9:17 AM      Passed - Valid encounter within last 12 months    Recent Outpatient Visits           9 months ago Type 2 diabetes mellitus with morbid obesity (HCC)   [facility] [place] Marcine Matar, MD   1 year ago Type 2 diabetes mellitus with obesity [facility])   [facility] [place]. [facility] Marcine Matar, MD   1 year ago Chronic maxillary sinusitis   [facility] [place]. [facility] Marcine Matar, MD   1 year ago Lumbar radiculopathy   [facility] Of [place]. [facility] Jonah Blue B, MD   1 year ago Severe persistent asthma with allergic rhinitis without complication   [facility] Dept Of [place]. [facility] Marcine Matar, MD       Future Appointments             In 2 months Hunsucker, Lesia Sago, MD [facility] Pulmonary Care at [place]   In 3 months [name], MD [facility] HeartCare at [facility]

## 2023-04-04 ENCOUNTER — Ambulatory Visit
Admission: RE | Admit: 2023-04-04 | Discharge: 2023-04-04 | Disposition: A | Discharge status: Home / Self Care | Payer: Medicare HMO | Source: Ambulatory Visit | Attending: Radiation Oncology | Admitting: Radiation Oncology

## 2023-04-04 ENCOUNTER — Other Ambulatory Visit: Payer: Self-pay

## 2023-04-04 DIAGNOSIS — Z510 Encounter for antineoplastic radiation therapy: Secondary | ICD-10-CM | POA: Diagnosis not present

## 2023-04-04 LAB — RAD ONC ARIA SESSION SUMMARY
Course Elapsed Days: 29
Plan Fractions Treated to Date: 22
Plan Prescribed Dose Per Fraction: 1.8 Gy
Plan Total Fractions Prescribed: 25
Plan Total Prescribed Dose: 45 Gy
Reference Point Dosage Given to Date: 39.6 Gy
Reference Point Session Dosage Given: 1.8 Gy
Session Number: 22

## 2023-04-05 ENCOUNTER — Other Ambulatory Visit: Payer: Self-pay

## 2023-04-05 ENCOUNTER — Ambulatory Visit
Admission: RE | Admit: 2023-04-05 | Discharge: 2023-04-05 | Disposition: A | Discharge status: Home / Self Care | Payer: Medicare HMO | Source: Ambulatory Visit | Attending: Radiation Oncology

## 2023-04-05 DIAGNOSIS — Z510 Encounter for antineoplastic radiation therapy: Secondary | ICD-10-CM | POA: Diagnosis not present

## 2023-04-05 LAB — RAD ONC ARIA SESSION SUMMARY
Course Elapsed Days: 30
Plan Fractions Treated to Date: 23
Plan Prescribed Dose Per Fraction: 1.8 Gy
Plan Total Fractions Prescribed: 25
Plan Total Prescribed Dose: 45 Gy
Reference Point Dosage Given to Date: 41.4 Gy
Reference Point Session Dosage Given: 1.8 Gy
Session Number: 23

## 2023-04-06 ENCOUNTER — Other Ambulatory Visit: Payer: Self-pay

## 2023-04-06 ENCOUNTER — Ambulatory Visit
Admission: RE | Admit: 2023-04-06 | Discharge: 2023-04-06 | Disposition: A | Discharge status: Home / Self Care | Payer: Medicare HMO | Source: Ambulatory Visit | Attending: Radiation Oncology | Admitting: Radiation Oncology

## 2023-04-06 ENCOUNTER — Other Ambulatory Visit: Payer: Self-pay | Admitting: Internal Medicine

## 2023-04-06 DIAGNOSIS — Z510 Encounter for antineoplastic radiation therapy: Secondary | ICD-10-CM | POA: Diagnosis not present

## 2023-04-06 LAB — RAD ONC ARIA SESSION SUMMARY
Course Elapsed Days: 31
Plan Fractions Treated to Date: 24
Plan Prescribed Dose Per Fraction: 1.8 Gy
Plan Total Fractions Prescribed: 25
Plan Total Prescribed Dose: 45 Gy
Reference Point Dosage Given to Date: 43.2 Gy
Reference Point Session Dosage Given: 1.8 Gy
Session Number: 24

## 2023-04-07 ENCOUNTER — Ambulatory Visit
Admission: RE | Admit: 2023-04-07 | Discharge: 2023-04-07 | Disposition: A | Discharge status: Home / Self Care | Payer: Medicare HMO | Source: Ambulatory Visit | Attending: Radiation Oncology

## 2023-04-07 ENCOUNTER — Other Ambulatory Visit: Payer: Self-pay

## 2023-04-07 DIAGNOSIS — Z510 Encounter for antineoplastic radiation therapy: Secondary | ICD-10-CM | POA: Diagnosis not present

## 2023-04-07 LAB — RAD ONC ARIA SESSION SUMMARY
Course Elapsed Days: 32
Plan Fractions Treated to Date: 25
Plan Prescribed Dose Per Fraction: 1.8 Gy
Plan Total Fractions Prescribed: 25
Plan Total Prescribed Dose: 45 Gy
Reference Point Dosage Given to Date: 45 Gy
Reference Point Session Dosage Given: 1.8 Gy
Session Number: 25

## 2023-04-09 ENCOUNTER — Ambulatory Visit
Admission: RE | Admit: 2023-04-09 | Discharge: 2023-04-09 | Disposition: A | Discharge status: Home / Self Care | Payer: Medicare HMO | Source: Ambulatory Visit | Attending: Radiation Oncology | Admitting: Radiation Oncology

## 2023-04-09 ENCOUNTER — Other Ambulatory Visit: Payer: Self-pay

## 2023-04-09 DIAGNOSIS — Z510 Encounter for antineoplastic radiation therapy: Secondary | ICD-10-CM | POA: Diagnosis not present

## 2023-04-09 LAB — RAD ONC ARIA SESSION SUMMARY
Course Elapsed Days: 34
Plan Fractions Treated to Date: 1
Plan Prescribed Dose Per Fraction: 1.8 Gy
Plan Total Fractions Prescribed: 13
Plan Total Prescribed Dose: 23.4 Gy
Reference Point Dosage Given to Date: 1.8 Gy
Reference Point Session Dosage Given: 1.8 Gy
Session Number: 26

## 2023-04-10 ENCOUNTER — Ambulatory Visit
Admission: RE | Admit: 2023-04-10 | Discharge: 2023-04-10 | Disposition: A | Discharge status: Home / Self Care | Payer: Medicare HMO | Source: Ambulatory Visit | Attending: Radiation Oncology | Admitting: Radiation Oncology

## 2023-04-10 ENCOUNTER — Other Ambulatory Visit: Payer: Self-pay

## 2023-04-10 ENCOUNTER — Other Ambulatory Visit: Payer: Self-pay | Admitting: Internal Medicine

## 2023-04-10 DIAGNOSIS — Z510 Encounter for antineoplastic radiation therapy: Secondary | ICD-10-CM | POA: Diagnosis not present

## 2023-04-10 LAB — RAD ONC ARIA SESSION SUMMARY
Course Elapsed Days: 35
Plan Fractions Treated to Date: 2
Plan Prescribed Dose Per Fraction: 1.8 Gy
Plan Total Fractions Prescribed: 13
Plan Total Prescribed Dose: 23.4 Gy
Reference Point Dosage Given to Date: 3.6 Gy
Reference Point Session Dosage Given: 1.8 Gy
Session Number: 27

## 2023-04-11 ENCOUNTER — Other Ambulatory Visit: Payer: Self-pay

## 2023-04-11 ENCOUNTER — Ambulatory Visit
Admission: RE | Admit: 2023-04-11 | Discharge: 2023-04-11 | Disposition: A | Discharge status: Home / Self Care | Payer: Medicare HMO | Source: Ambulatory Visit | Attending: Radiation Oncology | Admitting: Radiation Oncology

## 2023-04-11 DIAGNOSIS — Z510 Encounter for antineoplastic radiation therapy: Secondary | ICD-10-CM | POA: Diagnosis not present

## 2023-04-11 LAB — RAD ONC ARIA SESSION SUMMARY
Course Elapsed Days: 36
Plan Fractions Treated to Date: 3
Plan Prescribed Dose Per Fraction: 1.8 Gy
Plan Total Fractions Prescribed: 13
Plan Total Prescribed Dose: 23.4 Gy
Reference Point Dosage Given to Date: 5.4 Gy
Reference Point Session Dosage Given: 1.8 Gy
Session Number: 28

## 2023-04-11 NOTE — Telephone Encounter (Signed)
Requested medication (s) are due for refill today:   Provider to review  Requested medication (s) are on the active medication list:   Yes  Future visit scheduled:   No  Been over a year since pt seen at [facility].   Not sure still a pt of Dr. Henriette Combs   Last ordered: 01/11/2022  Returned for provider to review.   It's been refused twice.     Requested Prescriptions  Pending Prescriptions Disp Refills   montelukast (SINGULAIR) 10 MG tablet 30 tablet 3    Sig: Take 1 tablet (10 mg total) by mouth at bedtime.     Pulmonology:  Leukotriene Inhibitors Passed - 04/10/2023  8:51 AM      Passed - Valid encounter within last 12 months    Recent Outpatient Visits           9 months ago Type 2 diabetes mellitus with morbid obesity (HCC)   [facility] [place]. [facility] Marcine Matar, MD   1 year ago Type 2 diabetes mellitus with obesity [facility])   [facility] [place]. [facility] Marcine Matar, MD   1 year ago Chronic maxillary sinusitis   [facility] - [facility] Dept Of [place]. [facility] Marcine Matar, MD   1 year ago Lumbar radiculopathy   [facility] Of [place]. [facility] Jonah Blue B, MD   1 year ago Severe persistent asthma with allergic rhinitis without complication   [facility] - [facility] Dept Of [place]. [facility] Marcine Matar, MD       Future Appointments             In 2 months Hunsucker, Lesia Sago, MD [facility] Pulmonary Care at [place]   In 2 months [name], MD [facility] HeartCare at [facility]

## 2023-04-12 ENCOUNTER — Ambulatory Visit
Admission: RE | Admit: 2023-04-12 | Discharge: 2023-04-12 | Disposition: A | Discharge status: Home / Self Care | Payer: Medicare HMO | Source: Ambulatory Visit | Attending: Radiation Oncology

## 2023-04-12 ENCOUNTER — Other Ambulatory Visit: Payer: Self-pay

## 2023-04-12 DIAGNOSIS — Z510 Encounter for antineoplastic radiation therapy: Secondary | ICD-10-CM | POA: Diagnosis not present

## 2023-04-12 LAB — RAD ONC ARIA SESSION SUMMARY
Course Elapsed Days: 37
Plan Fractions Treated to Date: 4
Plan Prescribed Dose Per Fraction: 1.8 Gy
Plan Total Fractions Prescribed: 13
Plan Total Prescribed Dose: 23.4 Gy
Reference Point Dosage Given to Date: 7.2 Gy
Reference Point Session Dosage Given: 1.8 Gy
Session Number: 29

## 2023-04-17 ENCOUNTER — Ambulatory Visit: Payer: Medicare HMO

## 2023-04-17 ENCOUNTER — Ambulatory Visit
Admission: RE | Admit: 2023-04-17 | Discharge: 2023-04-17 | Disposition: A | Discharge status: Home / Self Care | Payer: Medicare HMO | Source: Ambulatory Visit | Attending: Radiation Oncology | Admitting: Radiation Oncology

## 2023-04-17 ENCOUNTER — Other Ambulatory Visit: Payer: Self-pay

## 2023-04-17 DIAGNOSIS — Z510 Encounter for antineoplastic radiation therapy: Secondary | ICD-10-CM | POA: Insufficient documentation

## 2023-04-17 DIAGNOSIS — C61 Malignant neoplasm of prostate: Secondary | ICD-10-CM | POA: Diagnosis not present

## 2023-04-17 DIAGNOSIS — R9721 Rising PSA following treatment for malignant neoplasm of prostate: Secondary | ICD-10-CM | POA: Diagnosis not present

## 2023-04-17 LAB — RAD ONC ARIA SESSION SUMMARY
Course Elapsed Days: 42
Plan Fractions Treated to Date: 5
Plan Prescribed Dose Per Fraction: 1.8 Gy
Plan Total Fractions Prescribed: 13
Plan Total Prescribed Dose: 23.4 Gy
Reference Point Dosage Given to Date: 9 Gy
Reference Point Session Dosage Given: 1.8 Gy
Session Number: 30

## 2023-04-18 ENCOUNTER — Ambulatory Visit
Admission: RE | Admit: 2023-04-18 | Discharge: 2023-04-18 | Disposition: A | Discharge status: Home / Self Care | Payer: Medicare HMO | Source: Ambulatory Visit | Attending: Radiation Oncology | Admitting: Radiation Oncology

## 2023-04-18 ENCOUNTER — Other Ambulatory Visit: Payer: Self-pay

## 2023-04-18 DIAGNOSIS — Z510 Encounter for antineoplastic radiation therapy: Secondary | ICD-10-CM | POA: Diagnosis not present

## 2023-04-18 LAB — RAD ONC ARIA SESSION SUMMARY
Course Elapsed Days: 43
Plan Fractions Treated to Date: 6
Plan Prescribed Dose Per Fraction: 1.8 Gy
Plan Total Fractions Prescribed: 13
Plan Total Prescribed Dose: 23.4 Gy
Reference Point Dosage Given to Date: 10.8 Gy
Reference Point Session Dosage Given: 1.8 Gy
Session Number: 31

## 2023-04-19 ENCOUNTER — Ambulatory Visit
Admission: RE | Admit: 2023-04-19 | Discharge: 2023-04-19 | Disposition: A | Discharge status: Home / Self Care | Payer: Medicare HMO | Source: Ambulatory Visit | Attending: Radiation Oncology

## 2023-04-19 ENCOUNTER — Other Ambulatory Visit: Payer: Self-pay

## 2023-04-19 DIAGNOSIS — Z510 Encounter for antineoplastic radiation therapy: Secondary | ICD-10-CM | POA: Diagnosis not present

## 2023-04-19 LAB — RAD ONC ARIA SESSION SUMMARY
Course Elapsed Days: 44
Plan Fractions Treated to Date: 7
Plan Prescribed Dose Per Fraction: 1.8 Gy
Plan Total Fractions Prescribed: 13
Plan Total Prescribed Dose: 23.4 Gy
Reference Point Dosage Given to Date: 12.6 Gy
Reference Point Session Dosage Given: 1.8 Gy
Session Number: 32

## 2023-04-20 ENCOUNTER — Ambulatory Visit
Admission: RE | Admit: 2023-04-20 | Discharge: 2023-04-20 | Disposition: A | Discharge status: Home / Self Care | Payer: Medicare HMO | Source: Ambulatory Visit | Attending: Radiation Oncology | Admitting: Radiation Oncology

## 2023-04-20 ENCOUNTER — Other Ambulatory Visit: Payer: Self-pay

## 2023-04-20 DIAGNOSIS — Z510 Encounter for antineoplastic radiation therapy: Secondary | ICD-10-CM | POA: Diagnosis not present

## 2023-04-20 LAB — RAD ONC ARIA SESSION SUMMARY
Course Elapsed Days: 45
Plan Fractions Treated to Date: 8
Plan Prescribed Dose Per Fraction: 1.8 Gy
Plan Total Fractions Prescribed: 13
Plan Total Prescribed Dose: 23.4 Gy
Reference Point Dosage Given to Date: 14.4 Gy
Reference Point Session Dosage Given: 1.8 Gy
Session Number: 33

## 2023-04-21 ENCOUNTER — Other Ambulatory Visit: Payer: Self-pay

## 2023-04-21 ENCOUNTER — Ambulatory Visit
Admission: RE | Admit: 2023-04-21 | Discharge: 2023-04-21 | Disposition: A | Discharge status: Home / Self Care | Payer: Medicare HMO | Source: Ambulatory Visit | Attending: Radiation Oncology | Admitting: Radiation Oncology

## 2023-04-21 DIAGNOSIS — Z510 Encounter for antineoplastic radiation therapy: Secondary | ICD-10-CM | POA: Diagnosis not present

## 2023-04-21 LAB — RAD ONC ARIA SESSION SUMMARY
Course Elapsed Days: 46
Plan Fractions Treated to Date: 9
Plan Prescribed Dose Per Fraction: 1.8 Gy
Plan Total Fractions Prescribed: 13
Plan Total Prescribed Dose: 23.4 Gy
Reference Point Dosage Given to Date: 16.2 Gy
Reference Point Session Dosage Given: 1.8 Gy
Session Number: 34

## 2023-04-24 ENCOUNTER — Other Ambulatory Visit: Payer: Self-pay

## 2023-04-24 ENCOUNTER — Ambulatory Visit
Admission: RE | Admit: 2023-04-24 | Discharge: 2023-04-24 | Disposition: A | Discharge status: Home / Self Care | Payer: Medicare HMO | Source: Ambulatory Visit | Attending: Radiation Oncology

## 2023-04-24 DIAGNOSIS — Z510 Encounter for antineoplastic radiation therapy: Secondary | ICD-10-CM | POA: Diagnosis not present

## 2023-04-24 LAB — RAD ONC ARIA SESSION SUMMARY
Course Elapsed Days: 49
Plan Fractions Treated to Date: 10
Plan Prescribed Dose Per Fraction: 1.8 Gy
Plan Total Fractions Prescribed: 13
Plan Total Prescribed Dose: 23.4 Gy
Reference Point Dosage Given to Date: 18 Gy
Reference Point Session Dosage Given: 1.8 Gy
Session Number: 35

## 2023-04-25 ENCOUNTER — Ambulatory Visit
Admission: RE | Admit: 2023-04-25 | Discharge: 2023-04-25 | Disposition: A | Discharge status: Home / Self Care | Payer: Medicare HMO | Source: Ambulatory Visit | Attending: Radiation Oncology | Admitting: Radiation Oncology

## 2023-04-25 ENCOUNTER — Other Ambulatory Visit: Payer: Self-pay

## 2023-04-25 ENCOUNTER — Encounter (HOSPITAL_COMMUNITY): Payer: Self-pay

## 2023-04-25 ENCOUNTER — Other Ambulatory Visit (HOSPITAL_COMMUNITY): Payer: Self-pay

## 2023-04-25 DIAGNOSIS — Z510 Encounter for antineoplastic radiation therapy: Secondary | ICD-10-CM | POA: Diagnosis not present

## 2023-04-25 LAB — RAD ONC ARIA SESSION SUMMARY
Course Elapsed Days: 50
Plan Fractions Treated to Date: 11
Plan Prescribed Dose Per Fraction: 1.8 Gy
Plan Total Fractions Prescribed: 13
Plan Total Prescribed Dose: 23.4 Gy
Reference Point Dosage Given to Date: 19.8 Gy
Reference Point Session Dosage Given: 1.8 Gy
Session Number: 36

## 2023-04-26 ENCOUNTER — Ambulatory Visit
Admission: RE | Admit: 2023-04-26 | Discharge: 2023-04-26 | Disposition: A | Discharge status: Home / Self Care | Payer: Medicare HMO | Source: Ambulatory Visit | Attending: Radiation Oncology | Admitting: Radiation Oncology

## 2023-04-26 ENCOUNTER — Other Ambulatory Visit: Payer: Self-pay

## 2023-04-26 DIAGNOSIS — Z510 Encounter for antineoplastic radiation therapy: Secondary | ICD-10-CM | POA: Diagnosis not present

## 2023-04-26 LAB — RAD ONC ARIA SESSION SUMMARY
Course Elapsed Days: 51
Plan Fractions Treated to Date: 12
Plan Prescribed Dose Per Fraction: 1.8 Gy
Plan Total Fractions Prescribed: 13
Plan Total Prescribed Dose: 23.4 Gy
Reference Point Dosage Given to Date: 21.6 Gy
Reference Point Session Dosage Given: 1.8 Gy
Session Number: 37

## 2023-04-27 ENCOUNTER — Other Ambulatory Visit: Payer: Self-pay

## 2023-04-27 ENCOUNTER — Ambulatory Visit: Payer: Medicare HMO

## 2023-04-27 ENCOUNTER — Ambulatory Visit
Admission: RE | Admit: 2023-04-27 | Discharge: 2023-04-27 | Disposition: A | Discharge status: Home / Self Care | Payer: Medicare HMO | Source: Ambulatory Visit | Attending: Radiation Oncology | Admitting: Radiation Oncology

## 2023-04-27 DIAGNOSIS — Z510 Encounter for antineoplastic radiation therapy: Secondary | ICD-10-CM | POA: Diagnosis not present

## 2023-04-27 LAB — RAD ONC ARIA SESSION SUMMARY
Course Elapsed Days: 52
Plan Fractions Treated to Date: 13
Plan Prescribed Dose Per Fraction: 1.8 Gy
Plan Total Fractions Prescribed: 13
Plan Total Prescribed Dose: 23.4 Gy
Reference Point Dosage Given to Date: 23.4 Gy
Reference Point Session Dosage Given: 1.8 Gy
Session Number: 38

## 2023-04-28 ENCOUNTER — Ambulatory Visit: Payer: Medicare HMO

## 2023-04-28 NOTE — Progress Notes (Signed)
Patient was a RadOnc Consult on 02/16/23 for his biochemically recurrent prostate cancer with postoperative PSA of 0.37.  Patient proceed with treatment recommendations of 7.5-week course of daily salvage radiotherapy to the prostate fossa and pelvic lymph nodes and had his final radiation treatment on 04/27/23.   Patient is scheduled for a post treatment nurse call on 05/30/23 and has his first post treatment PSA on 07/17/23 at [facility].    RN contacted urology to obtain post treatment follow up appointments. PSA on 3/3 @ 9:45am followed by MD visit on 3/10 @ 10:45am.  Patient aware of appointments.  RN provided education on post treatment PSA monitoring.  All questions answered. No additional needs at this time.

## 2023-04-28 NOTE — Radiation Completion Notes (Signed)
Patient Name: Joseph Harvey, Joseph Harvey MRN: [id number] Date of Birth: 01-Nov-1955 Referring Physician: Rhoderick Moody, M.D. Date of Service: 2023-04-28 Radiation Oncologist: Margaretmary Bayley, M.D. [facility] [place]                             RADIATION ONCOLOGY END OF TREATMENT NOTE     Diagnosis: C61 Malignant neoplasm of prostate Intent: Curative     ==========DELIVERED PLANS==========  First Treatment Date: 2023-03-06 Last Treatment Date: 2023-04-27   Plan Name: ProstBed_Pelv Site: Prostate Bed Technique: IMRT Mode: Photon Dose Per Fraction: 1.8 Gy Prescribed Dose (Delivered / Prescribed): 45 Gy / 45 Gy Prescribed Fxs (Delivered / Prescribed): 25 / 25   Plan Name: ProstBed_Bst Site: Prostate Bed Technique: IMRT Mode: Photon Dose Per Fraction: 1.8 Gy Prescribed Dose (Delivered / Prescribed): 23.4 Gy / 23.4 Gy Prescribed Fxs (Delivered / Prescribed): 13 / 13     ==========ON TREATMENT VISIT DATES========== 2023-03-10, 2023-03-17, 2023-03-24, 2023-03-30, 2023-04-07, 2023-04-17, 2023-04-21, 2023-04-26     ==========UPCOMING VISITS==========       ==========APPENDIX - ON TREATMENT VISIT NOTES==========   See weekly On Treatment Notes in Epic for details in the Media tab (listed as Progress notes on the On Treatment Visit Dates listed above).

## 2023-05-03 ENCOUNTER — Telehealth: Payer: Self-pay | Admitting: Pulmonary Disease

## 2023-05-03 DIAGNOSIS — J4550 Severe persistent asthma, uncomplicated: Secondary | ICD-10-CM

## 2023-05-03 MED ORDER — DUPIXENT 300 MG/2ML ~~LOC~~ SOAJ: 300.0000 mg | SUBCUTANEOUS | 1 refills | Status: DC

## 2023-05-03 NOTE — Telephone Encounter (Signed)
Junious Dresser states patient needs refill for Dupixent. Pharmacy is [facility]. Junious Dresser phone number is [phone].

## 2023-05-03 NOTE — Telephone Encounter (Signed)
Refill sent for DUPIXENT to [facility]: [phone]  Dose: 300mg  SQ every 14 days  Last OV: 03/20/2023 Provider: Dr. Judeth Horn  Next OV: 06/20/2023  Chesley Mires, PharmD, MPH, BCPS Clinical Pharmacist (Rheumatology and Pulmonology)

## 2023-05-08 ENCOUNTER — Telehealth: Payer: Self-pay | Admitting: Pulmonary Disease

## 2023-05-08 NOTE — Telephone Encounter (Signed)
Spoke with the pt again  I urged him to take his mucinex and levaquin prescribed for lung infection  He is scheduled for ov with Dr Judeth Horn in Feb 2025  I advised him to call for sooner appt or seem emergent care if not improving  Nothing further needed

## 2023-05-08 NOTE — Telephone Encounter (Signed)
I called and was speaking with the pt and his phone started breaking up  Before the call ended, he states he was being tx with levaquin for possible lung infection- [facility]  He c/o pain in his rib area due to cough  He has been taking tramadol and tylenol  He was already advised by his PCP to take mucinex to help with cough  He has not started this yet  He was asking for something for cough when the phone cut off   I tried calling back and there was no answer- LMTCB.

## 2023-05-08 NOTE — Telephone Encounter (Signed)
PT states he was at Urgent Care and although he has no pneumonia they told him he had a bacterial infection and gave him antibx. We have no appts. PT wanted Dr. Rexene Edison to know he has sever lung pain that feels like a broken rib. Rads were taken. Advised to present to the ER or his Primary Care.

## 2023-05-08 NOTE — Telephone Encounter (Signed)
Pt called back. °

## 2023-05-16 ENCOUNTER — Telehealth: Payer: Self-pay | Admitting: Pulmonary Disease

## 2023-05-16 DIAGNOSIS — J4550 Severe persistent asthma, uncomplicated: Secondary | ICD-10-CM

## 2023-05-16 NOTE — Telephone Encounter (Signed)
Lm x1 for patient.   Tammy, please advise if okay to order neb machine. Dr. Judeth Horn is unavailable.

## 2023-05-16 NOTE — Telephone Encounter (Signed)
[facility] to [facility]  Patient is aware and voiced his understanding.  Nothing further needed.

## 2023-05-16 NOTE — Telephone Encounter (Signed)
PT needs a new neb machine. His broke. Last seen 03/20/23. His # is [phone] States he needs it ASAP. ([facility] fax [phone])

## 2023-05-16 NOTE — Telephone Encounter (Signed)
 Yes can have , can send urgent

## 2023-05-24 ENCOUNTER — Telehealth: Payer: Self-pay | Admitting: Pulmonary Disease

## 2023-05-24 NOTE — Telephone Encounter (Signed)
 Pt went to urgent care and was prescribed Levofloxacin  750 mg and it was working for him but when he went out Monday the cold air started making him feel like it is coming back (walking pneumonia).  He needs a refill for another antibiotic   [id number] - [place], [place] - [address]

## 2023-05-25 ENCOUNTER — Telehealth: Payer: Self-pay

## 2023-05-25 MED ORDER — METOPROLOL SUCCINATE ER 50 MG PO TB24: 50.0000 mg | ORAL_TABLET | Freq: Every day | ORAL | 3 refills | Status: DC

## 2023-05-25 NOTE — Telephone Encounter (Signed)
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 This is increased when compared to his monitor from 09/2021 (after his ablation. Monitor also showed 7 short episodes of NSVT (fast Heart Beat coming from the bottom part of the heart). Longest episode only lasted 12 beats. Recommend increasing his metoprolol  succinate to 150 mg daily. He has a follow up appointment with Dr. Kate in 06/2023. At that appointment, can discuss if any further workup necessary   Thanks KJ

## 2023-05-25 NOTE — Telephone Encounter (Signed)
 When talking to patient they stated that their having to take the furosemide  40 mg every other day lately. Sometimes he does feel SOB and chest tightness on these days but it does go away after taking the furosemide  and going to the bathroom. He also reports that he has to take an extra dosing of 20 mg at times to help. He was curious if there was something else for him to do. Other wise the patient is okay with taking the metoprolol  150, He prefers to take 1 100 mg tablet and 1 50 mg tablet to get to 150 mg Routing to [employer]

## 2023-05-29 MED ORDER — METOPROLOL SUCCINATE ER 100 MG PO TB24: 100.0000 mg | ORAL_TABLET | Freq: Every day | ORAL | 3 refills | Status: DC

## 2023-05-29 MED ORDER — DOXYCYCLINE HYCLATE 100 MG PO TABS: 100.0000 mg | ORAL_TABLET | Freq: Two times a day (BID) | ORAL | 0 refills | Status: DC

## 2023-05-29 MED ORDER — PREDNISONE 10 MG PO TABS: 30.0000 mg | ORAL_TABLET | Freq: Every day | ORAL | 11 refills | Status: DC | PRN

## 2023-05-29 NOTE — Telephone Encounter (Signed)
 Doxycycline 100 mg BID x 7 days sent to [facility] pharmacy, spoke with patient and informed him

## 2023-05-29 NOTE — Telephone Encounter (Signed)
 Called and spoke with pt checking to see how he was doing after recent UC visit. Pt said that he is starting to feel some better but still has some lingering symptoms including a cough where is is getting up phlegm that is yellow in color. Patient also has some wheezing. Pt is on prednisone  that he takes as needed. Pt needed to have that Rx for prednisone  resent to the pharmacy for a refill so I have refilled pt's prednisone .    Pt is requesting to have another round of the abx sent in for him to see if he can get rid of the lingering symptoms as that cold air has really been bothering him.  Dr. Annella, please advise on this.

## 2023-05-29 NOTE — Telephone Encounter (Signed)
 Patient is unable to afford CTA at this time due to medical bills Per KJ schedule him with a provider on the 17th at 8:50 AM

## 2023-05-30 ENCOUNTER — Ambulatory Visit
Admission: RE | Admit: 2023-05-30 | Discharge: 2023-05-30 | Disposition: A | Discharge status: Home / Self Care | Payer: Medicare HMO | Source: Ambulatory Visit | Attending: Radiation Oncology | Admitting: Radiation Oncology

## 2023-05-30 DIAGNOSIS — Z510 Encounter for antineoplastic radiation therapy: Secondary | ICD-10-CM | POA: Insufficient documentation

## 2023-05-30 DIAGNOSIS — C61 Malignant neoplasm of prostate: Secondary | ICD-10-CM | POA: Insufficient documentation

## 2023-05-30 DIAGNOSIS — R9721 Rising PSA following treatment for malignant neoplasm of prostate: Secondary | ICD-10-CM | POA: Insufficient documentation

## 2023-05-30 NOTE — Progress Notes (Signed)
  Radiation Oncology         [phone]) [phone] ________________________________  Name: Joseph Harvey MRN: [id number]  Date of Service: 05/30/2023  DOB: 1955-10-04  Post Treatment Telephone Note  Diagnosis:  C61 Malignant neoplasm of prostate (as documented in provider EOT note)  Pre Treatment IPSS Score: 3 (as documented in the provider consult note)  The patient was available for call today.   Symptoms of fatigue have improved since completing therapy.  Symptoms of bladder changes have improved since completing therapy. Current symptoms include dysuria 2/10 (improving), and medications for bladder symptoms include AZO.  Symptoms of bowel changes have improved since completing therapy. Current symptoms include none, and medications for bowel symptoms include none.   Post Treatment IPSS Score: IPSS Questionnaire (AUA-7): Over the past month.   1)  How often have you had a sensation of not emptying your bladder completely after you finish urinating?  0 - Not at all  2)  How often have you had to urinate again less than two hours after you finished urinating? 1 - Less than 1 time in 5  3)  How often have you found you stopped and started again several times when you urinated?  0 - Not at all  4) How difficult have you found it to postpone urination?  1 - Less than 1 time in 5  5) How often have you had a weak urinary stream?  2 - Less than half the time  6) How often have you had to push or strain to begin urination?  0 - Not at all  7) How many times did you most typically get up to urinate from the time you went to bed until the time you got up in the morning?  2 - 2 times  Total score:  6. Which indicates mild symptoms  0-7 mildly symptomatic   8-19 moderately symptomatic   20-35 severely symptomatic    Patient has a scheduled follow up visit with his urologist, Dr. Devere, on 07/2023 for ongoing surveillance. He was counseled that PSA levels will be drawn in the urology office, and was  reassured that additional time is expected to improve bowel and bladder symptoms. He was encouraged to call back with concerns or questions regarding radiation.   This concludes the interaction.  Rosaline Minerva, LPN

## 2023-06-02 ENCOUNTER — Ambulatory Visit: Payer: Medicare HMO | Attending: Physician Assistant | Admitting: Emergency Medicine

## 2023-06-02 ENCOUNTER — Encounter: Payer: Self-pay | Admitting: Physician Assistant

## 2023-06-02 VITALS — BP 124/82 | HR 89 | Ht 69.0 in | Wt 239.8 lb

## 2023-06-02 DIAGNOSIS — I493 Ventricular premature depolarization: Secondary | ICD-10-CM | POA: Diagnosis not present

## 2023-06-02 DIAGNOSIS — I2510 Atherosclerotic heart disease of native coronary artery without angina pectoris: Secondary | ICD-10-CM

## 2023-06-02 DIAGNOSIS — M7989 Other specified soft tissue disorders: Secondary | ICD-10-CM | POA: Diagnosis not present

## 2023-06-02 DIAGNOSIS — I422 Other hypertrophic cardiomyopathy: Secondary | ICD-10-CM | POA: Diagnosis not present

## 2023-06-02 DIAGNOSIS — G4733 Obstructive sleep apnea (adult) (pediatric): Secondary | ICD-10-CM

## 2023-06-02 DIAGNOSIS — I10 Essential (primary) hypertension: Secondary | ICD-10-CM

## 2023-06-02 DIAGNOSIS — Z720 Tobacco use: Secondary | ICD-10-CM

## 2023-06-02 DIAGNOSIS — E785 Hyperlipidemia, unspecified: Secondary | ICD-10-CM

## 2023-06-02 LAB — CBC

## 2023-06-02 MED ORDER — AMLODIPINE BESYLATE 5 MG PO TABS: 5.0000 mg | ORAL_TABLET | Freq: Two times a day (BID) | ORAL | 3 refills | Status: DC

## 2023-06-02 NOTE — Patient Instructions (Addendum)
Medication Instructions:  NO CHANGES *If you need a refill on your cardiac medications before your next appointment, please call your pharmacy*   Lab Work: CBC,BMET, CMET TODAY If you have labs (blood work) drawn today and your tests are completely normal, you will receive your results only by: MyChart Message (if you have MyChart) OR A paper copy in the mail If you have any lab test that is abnormal or we need to change your treatment, we will call you to review the results.   Testing/Procedures: NO TESTING   Follow-Up: At [facility], you and your health needs are our priority.  As part of our continuing mission to provide you with exceptional heart care, we have created designated Provider Care Teams.  These Care Teams include your primary Cardiologist (physician) and Advanced Practice Providers (APPs -  Physician Assistants and Nurse Practitioners) who all work together to provide you with the care you need, when you need it.  Your next appointment:   KEEP FOLLOW UP July 07 2023  Provider:   [name], MD   Other Instructions

## 2023-06-02 NOTE — Progress Notes (Signed)
Cardiology Office Note:    Date:  06/02/2023  ID:  Joseph Harvey, DOB 09-16-1955, MRN [id number] PCP: Joseph Martes, NP   [facility] Providers Cardiologist:  Joseph Ishikawa, MD Electrophysiologist:  Joseph Lemming, MD       Patient Profile:      Joseph Harvey is a 68 year old male with visit pertinent history of prostate cancer, T2DM, HTN, HLD, tobacco use, hypertrophic cardiomyopathy  He previously underwent echocardiogram in November 2021 that showed severe septal hypertrophy, no RWMA, EF 60-65%, grade 1 DD, normal RV function.  In December 2021 he underwent CT cardiac scoring that showed a coronary calcium score 117 (81st percentile).  He underwent a nuclear stress test that was normal, low risk study with EF 55-65%.  Cardiac monitor in December 2021 showed 7 episodes of NSVT, longest lasting 3 beats, occasional PVCs.  He underwent cardiac MRI for evaluation of hypertrophic cardiomyopathy in February 2022 that showed asymmetric hypertrophy measuring up to 20 mm and basal anterior septum consistent with hypertrophic cardiomyopathy.  Also found to have patchy LGE in the basal septum and RV insertion sites consistent with HCM.  LGE accounted for 11% of total myocardial mass.  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  He was already on the max dose of nebivolol so he was referred to EP.  He underwent echocardiogram in November 2022 that showed EF 60-65%, no RWMA, severe asymmetric LVH, no LVOT obstruction, grade 1 DD, normal RV function.  He underwent PVC ablation in March 2023 with Dr. Elberta Harvey.  Follow-up long-term monitor and May 2023 showed occasional PVCs with only 1.6% burden.  Most recent echocardiogram April 2024 showed EF greater than 75%.  He was last seen by Joseph Harvey, [place] on 03/14/2023.  During the visit he had noted a near syncopal episode and recurrent chest pain.  A coronary CTA has been ordered that is pending.  Lab  cardiac monitor was completed October 2024 showing frequent PVCs (7.7% burden), 7 episodes of NSVT longest lasting 12 beats and 4 episodes of SVT longest lasting 18 beats.  His metoprolol succinate was increased to 150 mg daily.  Patient had called into clinic on 05/25/2023 with reports that they are having to take furosemide 40 mg every other day and sometimes an extra 20 mg tablet due to fluid volume and shortness of breath.      History of Present Illness:  Discussed the use of AI scribe software for clinical note transcription with the patient, who gave verbal consent to proceed.  Joseph Harvey is a 68 y.o. male who returns for concerns of leg swelling and fluid volume.   He presents today for acute visit with concerns about increased fluid retention over the past month. He reports needing to take 40mg  Lasix three to four times a week, up from his usual frequency of about twice a week. He notes he'll occasionally take 20mg  lasix at night maybe once a week as well.  He notes he does take his weight some days he will has not gained 3 pounds in 1 day or 5 pounds in 1 week.  He denies any changes in his chronic shortness of breath/DOE.  He is currently without any orthopnea or PND.  The patient associates this increased fluid accumulation with swelling in his legs and occasional chest pain, which he interprets as signs of excess fluid.  He notes he has had chest pain at rest about once to twice a week  over the last month.  He notes that his chest pain typically relieved when he feels like his fluid volume is back at his baseline.  He denies any exertional angina he notes he was recently diagnosed with pneumonia in December 2024 and currently on his second antibiotic which is doxycycline with his last dose in 4 days.  He also mentions a recent completion of a course of radiation treatment for cancer and a smoking habit of 10-11 cigarettes per day. Despite taking cholesterol medication, the patient expresses  concern about his cholesterol levels, which he believes to be high. He also expresses worry about potential kidney damage from frequent Lasix use.   He notes his palpitations have improved since increasing his metoprolol to 150 mg daily and he has been without any near-syncope or syncope since his last visit.    Review of Systems  Constitutional: Negative for weight gain and weight loss.  Cardiovascular:  Positive for chest pain, dyspnea on exertion and leg swelling. Negative for claudication, irregular heartbeat, near-syncope, orthopnea, palpitations, paroxysmal nocturnal dyspnea and syncope.  Respiratory:  Positive for shortness of breath. Negative for cough and hemoptysis.   Gastrointestinal:  Negative for abdominal pain, hematochezia and melena.  Genitourinary:  Negative for hematuria.  Neurological:  Negative for dizziness and light-headedness.     See HPI     Home Medications:    Prior to Admission medications   Medication Sig Start Date End Date Taking? Authorizing Provider  acetaminophen (TYLENOL) 325 MG tablet Take 2 tablets (650 mg total) by mouth every 4 (four) hours as needed for headache or mild pain. 08/11/21   Graciella Freer, PA-C  albuterol (PROVENTIL) (2.5 MG/3ML) 0.083% nebulizer solution TAKE 3 MLS BY NEBULIZATION EVERY 4 HOURS AS NEEDED FOR WHEEZING OR SHORTNESS OF BREATH (((PLAN B))) 11/09/22   Hunsucker, Lesia Sago, MD  albuterol (VENTOLIN HFA) 108 (90 Base) MCG/ACT inhaler Inhale 2 puffs into the lungs every 4 hours as needed for shortness of breath or wheezing 01/11/23   Hoy Register, MD  amLODipine (NORVASC) 5 MG tablet Take 1 tablet (5 mg total) by mouth in the morning and at bedtime. 03/14/23   Jonita Albee, PA-C  ASPIRIN 81 PO Take 81 mg by mouth daily.    [provider]  azithromycin (ZITHROMAX) 250 MG tablet Take 250 mg by mouth as directed. 02/15/23   [provider]  Blood Glucose Monitoring Suppl (TRUE METRIX METER) w/Device  KIT Use as directed 01/07/19   Marcine Matar, MD  Budeson-Glycopyrrol-Formoterol (BREZTRI AEROSPHERE) 160-9-4.8 MCG/ACT AERO Inhale 2 puffs into the lungs in the morning and at bedtime. 02/20/23   Hunsucker, Lesia Sago, MD  busPIRone (BUSPAR) 10 MG tablet Take 1 tablet (10 mg total) by mouth 3 (three) times daily. Patient taking differently: Take 10 mg by mouth 3 (three) times daily as needed (anxiety). 09/11/20   Marcine Matar, MD  Cholecalciferol (VITAMIN D PO) Take 1 tablet by mouth daily.    [provider]  doxycycline (VIBRA-TABS) 100 MG tablet Take 1 tablet (100 mg total) by mouth 2 (two) times daily. 05/29/23   Hunsucker, Lesia Sago, MD  Dupilumab (DUPIXENT) 300 MG/2ML SOAJ Inject 300 mg into the skin every 14 (fourteen) days. **loading dose completed in clinic on 12/15/22** 05/03/23   Hunsucker, Lesia Sago, MD  ezetimibe (ZETIA) 10 MG tablet Take 1 tablet (10 mg total) by mouth daily. 01/11/23   Joseph Ishikawa, MD  furosemide (LASIX) 40 MG tablet Take 40 mg  daily if gains 3 lbs overnight or 5 lbs in 1 week 03/14/23   Jonita Albee, PA-C  gabapentin (NEURONTIN) 300 MG capsule take 1 capsule by mouth in the morning, then take 1 at lunch and 2 at bedtime Patient taking differently: Take 300 mg by mouth 3 (three) times daily as needed (Nerve pain). 12/29/20     glucose blood (TRUE METRIX BLOOD GLUCOSE TEST) test strip Use as instructed 01/07/19   Marcine Matar, MD  ipratropium (ATROVENT) 0.06 % nasal spray Place 2 sprays into both nostrils 3 (three) times daily. As needed for nasal congestion, runny nose 03/20/23   Hunsucker, Lesia Sago, MD  ipratropium-albuterol (DUONEB) 0.5-2.5 (3) MG/3ML SOLN INHALE 1 VIAL VIA NEBULIZER TWICE A DAY 01/13/22   Marcine Matar, MD  lidocaine (LIDODERM) 5 % Apply 1 patch to affected area daily and leave on for 12 hours at a time.  Remove after 12 hours 01/05/22   Marcine Matar, MD  linaclotide [facility]) 72 MCG capsule Take 1 capsule  (72 mcg total) by mouth daily before breakfast. 06/22/22   Marcine Matar, MD  metFORMIN (GLUCOPHAGE-XR) 500 MG 24 hr tablet Take 2 tablets (1,000 mg total) by mouth in the morning AND 1 tablet (500 mg total) every evening with meals. 01/24/23   Marcine Matar, MD  metoprolol succinate (TOPROL-XL) 100 MG 24 hr tablet Take 1 tablet (100 mg total) by mouth daily. Take with or immediately following a meal. 05/29/23 08/27/23  Jonita Albee, PA-C  metoprolol succinate (TOPROL-XL) 50 MG 24 hr tablet Take 1 tablet (50 mg total) by mouth daily. Take with or immediately following a meal. 05/25/23 08/23/23  Jonita Albee, PA-C  metoprolol tartrate (LOPRESSOR) 100 MG tablet Take 1 tablet (100 mg total) by mouth once for 1 dose. 03/14/23 03/14/23  Jonita Albee, PA-C  montelukast (SINGULAIR) 10 MG tablet Take 1 tablet (10 mg total) by mouth at bedtime. 01/11/22   Marcine Matar, MD  omeprazole (PRILOSEC) 40 MG capsule Take 40 mg by mouth every morning. 10/28/22   [provider]  pantoprazole (PROTONIX) 40 MG tablet Take 1 tablet (40 mg total) by mouth daily as needed. 11/18/22   Marcine Matar, MD  potassium chloride SA (KLOR-CON M) 20 MEQ tablet Take 1 tablet (20 mEq total) by mouth daily as needed (When hands cramp up). 12/26/21   Storm Frisk, MD  predniSONE (DELTASONE) 10 MG tablet Take 3 tablets (30 mg total) by mouth daily as needed. 05/29/23   Hunsucker, Lesia Sago, MD  Respiratory Therapy Supplies (FLUTTER) DEVI Use three times a day after inhaler or nebulizer use 12/10/18   Storm Frisk, MD  rosuvastatin (CRESTOR) 20 MG tablet Take 1 tablet (20 mg total) by mouth daily. 02/02/22   Joseph Ishikawa, MD  Semaglutide (RYBELSUS) 7 MG TABS Take 1 tablet by mouth daily.    [provider]  silver sulfADIAZINE (SILVADENE) 1 % cream Apply twice a day to affected area 10/14/22   Marcine Matar, MD  traMADol (ULTRAM) 50 MG tablet Take 1 tablet (50 mg total) by  mouth every evening. Each prescription to last 1 mth 09/28/22   Marcine Matar, MD  TRUEplus Lancets 28G MISC Use as directed 01/07/19   Marcine Matar, MD  valsartan (DIOVAN) 320 MG tablet Take 1 tablet (320 mg total) by mouth daily. 10/05/22 10/05/23  Joseph Ishikawa, MD   Studies Reviewed:   EKG Interpretation Date/Time:  Friday June 02 2023 09:01:49 EST Ventricular Rate:  89 PR Interval:  160 QRS Duration:  72 QT Interval:  344 QTC Calculation: 418 R Axis:   48  Text Interpretation: Normal sinus rhythm Normal ECG Confirmed by Rise Paganini [phone]) on 06/02/2023 9:35:52 AM    ZIO 03/13/2020   Frequent PVCs (7.7% of beats)   7 episodes of NSVT, longest lasting 12 beats   4 episodes of SVT, longest lasting 18 beats  Echocardiogram 09/05/2022 1. Vigorous LV function with near mid cavity obliteration during systole.  With Valsalva, gradient increased to 32 mm Hg.Marland Kitchen Left ventricular ejection  fraction, by estimation, is >75%. The left ventricle has hyperdynamic  function. The left ventricle has no   regional wall motion abnormalities. There is moderate asymmetric left  ventricular hypertrophy. Left ventricular diastolic parameters are  consistent with Grade I diastolic dysfunction (impaired relaxation).   2. Right ventricular systolic function is normal. The right ventricular  size is normal.   3. The mitral valve is normal in structure. Trivial mitral valve  regurgitation.   4. The aortic valve is tricuspid. Aortic valve regurgitation is not  visualized.   Lexiscan Myoview 04/29/2020 The left ventricular ejection fraction is normal (55-65%). Nuclear stress EF: 57%. There was no ST segment deviation noted during stress. Negative stress test The study is normal. This is a low risk study.  Risk Assessment/Calculations:             Physical Exam:   VS:  BP 124/82   Pulse 89   Ht 5\' 9"  (1.753 m)   Wt 239 lb 12.8 oz (108.8 kg)   SpO2 97%   BMI 35.41  kg/m    Wt Readings from Last 3 Encounters:  06/02/23 239 lb 12.8 oz (108.8 kg)  03/20/23 237 lb (107.5 kg)  03/14/23 234 lb (106.1 kg)    Constitutional:      Appearance: Normal and healthy appearance. Not in distress.  Neck:     Vascular: JVD normal.  Pulmonary:     Effort: Pulmonary effort is normal.     Breath sounds: Normal breath sounds.  Chest:     Chest wall: Not tender to palpatation.  Cardiovascular:     PMI at left midclavicular line. Normal rate. Regular rhythm. Normal S1. Normal S2.      Murmurs: There is no murmur.     No gallop.  No click. No rub.  Pulses:    Intact distal pulses.  Edema:    Pretibial: bilateral trace edema of the pretibial area.    Ankle: bilateral trace edema of the ankle.    Feet: bilateral trace edema of the feet. Musculoskeletal: Normal range of motion.     Cervical back: Normal range of motion and neck supple. Skin:    General: Skin is warm and dry.  Neurological:     General: No focal deficit present.     Mental Status: Alert, oriented to person, place, and time and oriented to person, place and time.  Psychiatric:        Mood and Affect: Mood and affect normal.        Behavior: Behavior is cooperative.        Thought Content: Thought content normal.        Assessment and Plan:  Leg swelling Over the past month patients been self-adjusting Lasix dose based on symptoms of leg swelling but not so much on his weight. Previously taking Lasix 40mg  1-2 times weekly now taking 3-4  times weekly based on LE swelling.  -Recent Echo 08/2022 EF >75% with hyperdynamic function with Valsalva, gradient increased to 32 mm Hg  -Exam today shows trace bilateral LE edema. Lugs clear to auscultation.  -Plan for BMET, CBC, BNP. He politely declines echocardiogram at this time given financial concerns  -I will have him continue Lasix 40mg  as needed for weight gain of 3 pounds in one day or 5 pounds in one week. I prefer him not to take daily at this time  given his hx of HCM in order to avoid increased LVOT and dehydration  Hypertrophic cardiomyopathy -cMRI in 06/2020 showed asymmetric hypertrophy measuring up to 20 mm and basal anterior septum (8 mm and posterior wall) -Echocardiogram 08/2022 showed hyperdynamic LV function with near mid cavity obliteration during systole with gradient up to 32 mmHg with valsalva -Euvolemic and well-compensated on exam with trace bilateral LE edema -Spoke regarding importance of adequate hydration. No recent syncope/near syncope  -Continue Lasix 40mg  as needed and Metoprolol succinate 150mg  daily  Chest Pain / CAD -Coronary calcium score in 2021 (score 117, 81st percentile)  -His EKG today: NSR with no ST-T wave changes  -Associates increased fluid accumulation with occasional chest pain. CP is non-exertional.  -Dr. Bjorn Pippin has ordered coronary CTA for patient in 11/2022 and again in 12/2022. I encouraged him to have this completed, however he defers coronary CTA at this time over financial concerns -Continue ASA 81mg  daily, Zetia 10mg  daily, Rosuvastatin 20mg  daily  PVCs Hx PVC ablation 07/2021 Zio 02/2023 showing frequent PVCs (7.7% burden), 7 episodes of NSVT and 4 episodes of SVT. (Increased Metoprolol-XL to 150mg  daily at this time) -Today he denies recent palpitations, syncope/near syncope -Continue Metoprolol-XL 150mg  daily   Hyperlipidemia LDL 39 on 07/2022  Repeat fasting lipid panel today -Continue Rosuvastatin 20mg  daily and Zetia 10mg  daily   OSA Reports adherence to CPAP  Tobacco Use Patient reports smoking 10-11 cigarettes per day. -Encouraged smoking cessation   HTN BP today 124/82 and under excellent control  -Continue current antihypertensive regimen            Dispo:  Follow-up with scheduled visit on 07/07/2023  Signed, Denyce Robert, NP

## 2023-06-03 LAB — CBC
Hematocrit: 37 % — ABNORMAL LOW (ref 37.5–51.0)
Hemoglobin: 12.7 g/dL — ABNORMAL LOW (ref 13.0–17.7)
MCH: 31.9 pg (ref 26.6–33.0)
MCHC: 34.3 g/dL (ref 31.5–35.7)
MCV: 93 fL (ref 79–97)
Platelets: 293 10*3/uL (ref 150–450)
RBC: 3.98 x10E6/uL — ABNORMAL LOW (ref 4.14–5.80)
RDW: 12.9 % (ref 11.6–15.4)
WBC: 6.8 10*3/uL (ref 3.4–10.8)

## 2023-06-03 LAB — COMPREHENSIVE METABOLIC PANEL
ALT: 39 [IU]/L (ref 0–44)
AST: 21 [IU]/L (ref 0–40)
Albumin: 4.1 g/dL (ref 3.9–4.9)
Alkaline Phosphatase: 73 [IU]/L (ref 44–121)
BUN/Creatinine Ratio: 15 (ref 10–24)
BUN: 16 mg/dL (ref 8–27)
Bilirubin Total: 0.5 mg/dL (ref 0.0–1.2)
CO2: 23 mmol/L (ref 20–29)
Calcium: 9 mg/dL (ref 8.6–10.2)
Chloride: 104 mmol/L (ref 96–106)
Creatinine, Ser: 1.08 mg/dL (ref 0.76–1.27)
Globulin, Total: 1.9 g/dL (ref 1.5–4.5)
Glucose: 120 mg/dL — ABNORMAL HIGH (ref 70–99)
Potassium: 4.5 mmol/L (ref 3.5–5.2)
Sodium: 141 mmol/L (ref 134–144)
Total Protein: 6 g/dL (ref 6.0–8.5)
eGFR: 76 mL/min/{1.73_m2} (ref >59)

## 2023-06-05 ENCOUNTER — Telehealth: Payer: Self-pay | Admitting: Pulmonary Disease

## 2023-06-05 NOTE — Telephone Encounter (Signed)
Patient is returning phone call. Patient phone number is [phone]. °

## 2023-06-07 ENCOUNTER — Telehealth: Payer: Self-pay

## 2023-06-07 NOTE — Telephone Encounter (Addendum)
Results viewed by patient via Mychart.----- Message from Azalee Course sent at 06/05/2023  1:48 PM EST ----- Ordered by Wyn Forster. Stable renal function and electrolyte. Stable red blood cell count. Normal liver function.

## 2023-06-12 ENCOUNTER — Other Ambulatory Visit: Payer: Self-pay

## 2023-06-13 NOTE — Telephone Encounter (Signed)
I called and spoke with the pt  Nothing needed  He is aware doxy was sent 05/29/23

## 2023-06-16 ENCOUNTER — Other Ambulatory Visit: Payer: Self-pay | Admitting: Pulmonary Disease

## 2023-06-19 ENCOUNTER — Emergency Department (HOSPITAL_BASED_OUTPATIENT_CLINIC_OR_DEPARTMENT_OTHER)
Admission: EM | Admit: 2023-06-19 | Discharge: 2023-06-19 | Disposition: A | Discharge status: Home / Self Care | Payer: Medicare PPO

## 2023-06-19 ENCOUNTER — Encounter (HOSPITAL_BASED_OUTPATIENT_CLINIC_OR_DEPARTMENT_OTHER): Payer: Self-pay | Admitting: Emergency Medicine

## 2023-06-19 ENCOUNTER — Emergency Department (HOSPITAL_BASED_OUTPATIENT_CLINIC_OR_DEPARTMENT_OTHER): Payer: Medicare PPO

## 2023-06-19 ENCOUNTER — Telehealth: Payer: Self-pay | Admitting: Pulmonary Disease

## 2023-06-19 ENCOUNTER — Other Ambulatory Visit: Payer: Self-pay

## 2023-06-19 DIAGNOSIS — E119 Type 2 diabetes mellitus without complications: Secondary | ICD-10-CM | POA: Insufficient documentation

## 2023-06-19 DIAGNOSIS — J101 Influenza due to other identified influenza virus with other respiratory manifestations: Secondary | ICD-10-CM | POA: Diagnosis not present

## 2023-06-19 DIAGNOSIS — Z7984 Long term (current) use of oral hypoglycemic drugs: Secondary | ICD-10-CM | POA: Insufficient documentation

## 2023-06-19 DIAGNOSIS — Z7982 Long term (current) use of aspirin: Secondary | ICD-10-CM | POA: Insufficient documentation

## 2023-06-19 DIAGNOSIS — R600 Localized edema: Secondary | ICD-10-CM | POA: Insufficient documentation

## 2023-06-19 DIAGNOSIS — J45909 Unspecified asthma, uncomplicated: Secondary | ICD-10-CM | POA: Insufficient documentation

## 2023-06-19 DIAGNOSIS — J441 Chronic obstructive pulmonary disease with (acute) exacerbation: Secondary | ICD-10-CM | POA: Insufficient documentation

## 2023-06-19 LAB — CBC
HCT: 38.7 % — ABNORMAL LOW (ref 39.0–52.0)
Hemoglobin: 13.5 g/dL (ref 13.0–17.0)
MCH: 32.1 pg (ref 26.0–34.0)
MCHC: 34.9 g/dL (ref 30.0–36.0)
MCV: 91.9 fL (ref 80.0–100.0)
Platelets: 307 10*3/uL (ref 150–400)
RBC: 4.21 MIL/uL — ABNORMAL LOW (ref 4.22–5.81)
RDW: 13.2 % (ref 11.5–15.5)
WBC: 7.5 10*3/uL (ref 4.0–10.5)
nRBC: 0 % (ref 0.0–0.2)

## 2023-06-19 LAB — BASIC METABOLIC PANEL
Anion gap: 11 (ref 5–15)
BUN: 11 mg/dL (ref 8–23)
CO2: 25 mmol/L (ref 22–32)
Calcium: 9.3 mg/dL (ref 8.9–10.3)
Chloride: 104 mmol/L (ref 98–111)
Creatinine, Ser: 0.98 mg/dL (ref 0.61–1.24)
GFR, Estimated: >60 mL/min (ref >60)
Glucose, Bld: 159 mg/dL — ABNORMAL HIGH (ref 70–99)
Potassium: 3.5 mmol/L (ref 3.5–5.1)
Sodium: 140 mmol/L (ref 135–145)

## 2023-06-19 LAB — BRAIN NATRIURETIC PEPTIDE: B Natriuretic Peptide: 50.7 pg/mL (ref 0.0–100.0)

## 2023-06-19 LAB — TROPONIN I (HIGH SENSITIVITY): Troponin I (High Sensitivity): 11 ng/L (ref <18)

## 2023-06-19 MED ORDER — IPRATROPIUM BROMIDE 0.02 % IN SOLN
0.5000 mg | Freq: Once | RESPIRATORY_TRACT | Status: AC
  Administered 2023-06-19: 0.5 mg via RESPIRATORY_TRACT
  Filled 2023-06-19: qty 2.5

## 2023-06-19 MED ORDER — IPRATROPIUM-ALBUTEROL 0.5-2.5 (3) MG/3ML IN SOLN
3.0000 mL | Freq: Once | RESPIRATORY_TRACT | Status: AC
  Administered 2023-06-19: 3 mL via RESPIRATORY_TRACT
  Filled 2023-06-19: qty 3

## 2023-06-19 MED ORDER — METHYLPREDNISOLONE SODIUM SUCC 125 MG IJ SOLR
125.0000 mg | Freq: Once | INTRAMUSCULAR | Status: AC
  Administered 2023-06-19: 125 mg via INTRAVENOUS
  Filled 2023-06-19: qty 2

## 2023-06-19 MED ORDER — ALBUTEROL SULFATE (2.5 MG/3ML) 0.083% IN NEBU
5.0000 mg | INHALATION_SOLUTION | Freq: Once | RESPIRATORY_TRACT | Status: AC
  Administered 2023-06-19: 5 mg via RESPIRATORY_TRACT
  Filled 2023-06-19: qty 6

## 2023-06-19 MED ORDER — FUROSEMIDE 10 MG/ML IJ SOLN
40.0000 mg | Freq: Once | INTRAMUSCULAR | Status: AC
  Administered 2023-06-19: 40 mg via INTRAVENOUS
  Filled 2023-06-19: qty 4

## 2023-06-19 NOTE — Discharge Instructions (Signed)
You are leaving AGAINST MEDICAL ADVICE.  If you change your mind and would like treatment, please return to the emergency department we will gladly see you again.  As next step please follow-up with your primary doctor and your pulmonologist.  Take your Lasix again tomorrow morning.  Return immediately felt fevers, chills, lightheadedness, passout, chest pain, worsening shortness of breath, or you develop any new or worsening symptoms that are concerning to you.

## 2023-06-19 NOTE — ED Provider Notes (Signed)
[place] EMERGENCY DEPARTMENT AT [facility] Provider Note   CSN: [id number] Arrival date & time: 06/19/23  1311     History  Chief Complaint  Patient presents with   Shortness of Breath    Joseph Harvey is a 68 y.o. male.  68 year old male presenting emergency department for shortness of breath.  Reports chronic shortness of breath, but worsened acutely today.  He has had a cough for the past couple weeks.  He is not on oxygen at home.  Reports history of COPD/asthma.  Denies any chest pain.  Took his albuterol at home and prednisone with little improvement.  He notes that he had productive cough.  He does have Lasix that he takes as needed and has not taken them for the past couple days.   Shortness of Breath      Home Medications Prior to Admission medications   Medication Sig Start Date End Date Taking? Authorizing Provider  acetaminophen (TYLENOL) 325 MG tablet Take 2 tablets (650 mg total) by mouth every 4 (four) hours as needed for headache or mild pain. 08/11/21   Graciella Freer, PA-C  albuterol (PROVENTIL) (2.5 MG/3ML) 0.083% nebulizer solution TAKE 3 MLS BY NEBULIZATION EVERY 4 HOURS AS NEEDED FOR WHEEZING OR SHORTNESS OF BREATH (((PLAN B))) 11/09/22   Hunsucker, Lesia Sago, MD  albuterol (VENTOLIN HFA) 108 (90 Base) MCG/ACT inhaler Inhale 2 puffs into the lungs every 4 hours as needed for shortness of breath or wheezing 01/11/23   Hoy Register, MD  amLODipine (NORVASC) 5 MG tablet Take 1 tablet (5 mg total) by mouth in the morning and at bedtime. 06/02/23   Azalee Course, PA  ASPIRIN 81 PO Take 81 mg by mouth daily.    [provider]  azithromycin (ZITHROMAX) 250 MG tablet Take 250 mg by mouth as directed. 02/15/23   [provider]  Blood Glucose Monitoring Suppl (TRUE METRIX METER) w/Device KIT Use as directed 01/07/19   Marcine Matar, MD  Budeson-Glycopyrrol-Formoterol (BREZTRI AEROSPHERE) 160-9-4.8 MCG/ACT AERO Inhale 2 puffs into  the lungs in the morning and at bedtime. 02/20/23   Hunsucker, Lesia Sago, MD  busPIRone (BUSPAR) 10 MG tablet Take 1 tablet (10 mg total) by mouth 3 (three) times daily. Patient taking differently: Take 10 mg by mouth 3 (three) times daily as needed (anxiety). 09/11/20   Marcine Matar, MD  Cholecalciferol (VITAMIN D PO) Take 1 tablet by mouth daily.    [provider]  doxycycline (VIBRA-TABS) 100 MG tablet Take 1 tablet (100 mg total) by mouth 2 (two) times daily. 05/29/23   Hunsucker, Lesia Sago, MD  Dupilumab (DUPIXENT) 300 MG/2ML SOAJ Inject 300 mg into the skin every 14 (fourteen) days. **loading dose completed in clinic on 12/15/22** 05/03/23   Hunsucker, Lesia Sago, MD  ezetimibe (ZETIA) 10 MG tablet Take 1 tablet (10 mg total) by mouth daily. 01/11/23   [name], MD  furosemide (LASIX) 40 MG tablet Take 40 mg daily if gains 3 lbs overnight or 5 lbs in 1 week 03/14/23   Jonita Albee, PA-C  gabapentin (NEURONTIN) 300 MG capsule take 1 capsule by mouth in the morning, then take 1 at lunch and 2 at bedtime Patient taking differently: Take 300 mg by mouth 3 (three) times daily as needed (Nerve pain). 12/29/20     glucose blood (TRUE METRIX BLOOD GLUCOSE TEST) test strip Use as instructed 01/07/19   Marcine Matar, MD  ipratropium (ATROVENT) 0.06 % nasal spray Place  2 sprays into both nostrils 3 (three) times daily. As needed for nasal congestion, runny nose 03/20/23   Hunsucker, Lesia Sago, MD  ipratropium-albuterol (DUONEB) 0.5-2.5 (3) MG/3ML SOLN INHALE 1 VIAL VIA NEBULIZER TWICE A DAY 01/13/22   Marcine Matar, MD  lidocaine (LIDODERM) 5 % Apply 1 patch to affected area daily and leave on for 12 hours at a time.  Remove after 12 hours 01/05/22   Marcine Matar, MD  linaclotide [facility]) 72 MCG capsule Take 1 capsule (72 mcg total) by mouth daily before breakfast. 06/22/22   Marcine Matar, MD  metFORMIN (GLUCOPHAGE-XR) 500 MG 24 hr tablet Take 2 tablets (1,000  mg total) by mouth in the morning AND 1 tablet (500 mg total) every evening with meals. 01/24/23   Marcine Matar, MD  metoprolol succinate (TOPROL-XL) 100 MG 24 hr tablet Take 1 tablet (100 mg total) by mouth daily. Take with or immediately following a meal. 05/29/23 08/27/23  Jonita Albee, PA-C  metoprolol succinate (TOPROL-XL) 50 MG 24 hr tablet Take 1 tablet (50 mg total) by mouth daily. Take with or immediately following a meal. 05/25/23 08/23/23  Jonita Albee, PA-C  montelukast (SINGULAIR) 10 MG tablet Take 1 tablet (10 mg total) by mouth at bedtime. 01/11/22   Marcine Matar, MD  omeprazole (PRILOSEC) 40 MG capsule Take 40 mg by mouth every morning. 10/28/22   [provider]  pantoprazole (PROTONIX) 40 MG tablet Take 1 tablet (40 mg total) by mouth daily as needed. 11/18/22   Marcine Matar, MD  potassium chloride SA (KLOR-CON M) 20 MEQ tablet Take 1 tablet (20 mEq total) by mouth daily as needed (When hands cramp up). 12/26/21   Storm Frisk, MD  predniSONE (DELTASONE) 10 MG tablet Take 3 tablets (30 mg total) by mouth daily as needed. 05/29/23   Hunsucker, Lesia Sago, MD  Respiratory Therapy Supplies (FLUTTER) DEVI Use three times a day after inhaler or nebulizer use 12/10/18   Storm Frisk, MD  rosuvastatin (CRESTOR) 20 MG tablet Take 1 tablet (20 mg total) by mouth daily. 02/02/22   [name], MD  Semaglutide (RYBELSUS) 7 MG TABS Take 1 tablet by mouth daily.    [provider]  silver sulfADIAZINE (SILVADENE) 1 % cream Apply twice a day to affected area 10/14/22   Marcine Matar, MD  traMADol (ULTRAM) 50 MG tablet Take 1 tablet (50 mg total) by mouth every evening. Each prescription to last 1 mth 09/28/22   Marcine Matar, MD  TRUEplus Lancets 28G MISC Use as directed 01/07/19   Marcine Matar, MD  valsartan (DIOVAN) 320 MG tablet Take 1 tablet (320 mg total) by mouth daily. 10/05/22 10/05/23  [name], MD       Allergies    Amoxicillin, Lisinopril, Molds & smuts, Robitussin [guaifenesin], and Azithromycin    Review of Systems   Review of Systems  Respiratory:  Positive for shortness of breath.     Physical Exam Updated Vital Signs BP 138/73   Pulse (!) 117   Temp 98.8 F (37.1 C)   Resp (!) 21   Wt 108.4 kg   SpO2 93%   BMI 35.29 kg/m  Physical Exam Vitals and nursing note reviewed.  Constitutional:      General: He is in acute distress.     Appearance: He is obese.  HENT:     Head: Normocephalic.  Cardiovascular:     Rate and Rhythm:  Normal rate and regular rhythm.  Pulmonary:     Effort: Tachypnea, accessory muscle usage and respiratory distress (moderate) present.     Breath sounds: Wheezing present.  Musculoskeletal:     Cervical back: Normal range of motion.     Right lower leg: Edema present.     Left lower leg: Edema present.  Skin:    General: Skin is warm.     Capillary Refill: Capillary refill takes less than 2 seconds.  Neurological:     General: No focal deficit present.  Psychiatric:        Mood and Affect: Mood normal.        Behavior: Behavior normal.     ED Results / Procedures / Treatments   Labs (all labs ordered are listed, but only abnormal results are displayed) Labs Reviewed  CBC - Abnormal; Notable for the following components:      Result Value   RBC 4.21 (*)    HCT 38.7 (*)    All other components within normal limits  BASIC METABOLIC PANEL - Abnormal; Notable for the following components:   Glucose, Bld 159 (*)    All other components within normal limits  BRAIN NATRIURETIC PEPTIDE  TROPONIN I (HIGH SENSITIVITY)  TROPONIN I (HIGH SENSITIVITY)    EKG EKG Interpretation Date/Time:  Monday June 19 2023 13:24:28 EST Ventricular Rate:  110 PR Interval:  142 QRS Duration:  70 QT Interval:  322 QTC Calculation: 435 R Axis:   77  Text Interpretation: Sinus tachycardia Anterior infarct , age undetermined Marked ST abnormality,  possible lateral subendocardial injury Abnormal ECG When compared with ECG of 02-Jun-2023 09:01, Anterior infarct is now Present ST now depressed in Lateral leads T wave inversion now evident in Lateral leads Confirmed by Estanislado Pandy [phone]) on 06/19/2023 2:15:37 PM  Radiology DG Chest Portable 1 View Result Date: 06/19/2023 CLINICAL DATA:  Shortness of breath. EXAM: PORTABLE CHEST 1 VIEW COMPARISON:  Chest radiograph dated 09/23/2022. FINDINGS: Minimal bibasilar interstitial streaky atelectasis. No focal consolidation, pleural effusion, or pneumothorax. The cardiac silhouette is within normal limits. No acute osseous pathology. IMPRESSION: No active disease. Electronically Signed   By: Elgie Collard M.D.   On: 06/19/2023 15:23    Procedures Procedures    Medications Ordered in ED Medications  ipratropium-albuterol (DUONEB) 0.5-2.5 (3) MG/3ML nebulizer solution 3 mL (3 mLs Nebulization Given 06/19/23 1336)  methylPREDNISolone sodium succinate (SOLU-MEDROL) 125 mg/2 mL injection 125 mg (125 mg Intravenous Given 06/19/23 1425)  albuterol (PROVENTIL) (2.5 MG/3ML) 0.083% nebulizer solution 5 mg (5 mg Nebulization Given 06/19/23 1448)  ipratropium (ATROVENT) nebulizer solution 0.5 mg (0.5 mg Nebulization Given 06/19/23 1448)  furosemide (LASIX) injection 40 mg (40 mg Intravenous Given 06/19/23 1424)    ED Course/ Medical Decision Making/ A&P Clinical Course as of 06/19/23 1601  Mon Jun 19, 2023  1555 Patient ambulated without.  Did have some shortness of breath.  Offered admission, but he refused.  Placement concerned that he may deteriorate if he were to leave and his breathing could lead to respiratory distress/arrest or permanent disability.  Voiced understanding of the risk.  Has next step stated that he will follow-up with his pulmonologist.  He has prednisone at home as well as albuterol.  Instructed to take Lasix as there may be a volume component.  Patient will be leaving AGAINST MEDICAL ADVICE at  this time. [TY]    Clinical Course User Index [TY] Coral Spikes, DO  Medical Decision Making This is a 68 year old male presenting emergency department for shortness of breath.  Has a complex past medical history to include diabetes, morbid obesity, cardiomyopathy, COPD/asthma not on oxygen, prostate cancer presenting emergency department for shortness of breath.  He is slightly tachycardic, hemodynamically stable.  Afebrile.  Was borderline hypoxic with oxygen saturation 91% at rest.  Moderate respiratory distress with short word sentences, increased work of breathing with accessory muscle use and tachypnea.  Given steroids and breathing treatments here for COPD, but suspect there may be a volume component, given Lasix here.  Chest x-ray on my independent interpretation with some vascular congestion, but no overt pulmonary edema.  No pneumonia or pneumothorax.  Patient feeling somewhat improved after breathing treatments and Lasix.  Does not want to be admitted, but will if need be.  Will attempt to ambulate.  Amount and/or Complexity of Data Reviewed External Data Reviewed:     Details: Per most recent cardiology note : "Leg swelling Over the past month patients been self-adjusting Lasix dose based on symptoms of leg swelling but not so much on his weight. Previously taking Lasix 40mg  1-2 times weekly now taking 3-4 times weekly based on LE swelling.  -Recent Echo 08/2022 EF >75% with hyperdynamic function with Valsalva, gradient increased to 32 mm Hg  -Exam today shows trace bilateral LE edema. Lugs clear to auscultation.  -Plan for BMET, CBC, BNP. He politely declines echocardiogram at this time given financial concerns  -I will have him continue Lasix 40mg  as needed for weight gain of 3 pounds in one day or 5 pounds in one week. I prefer him not to take daily at this time given his hx of HCM in order to avoid increased LVOT and dehydration   Hypertrophic  cardiomyopathy -cMRI in 06/2020 showed asymmetric hypertrophy measuring up to 20 mm and basal anterior septum (8 mm and posterior wall) -Echocardiogram 08/2022 showed hyperdynamic LV function with near mid cavity obliteration during systole with gradient up to 32 mmHg with valsalva -Euvolemic and well-compensated on exam with trace bilateral LE edema -Spoke regarding importance of adequate hydration. No recent syncope/near syncope  -Continue Lasix 40mg  as needed and Metoprolol succinate 150mg  daily   " Labs: ordered. Radiology: ordered.  Risk Prescription drug management. Decision regarding hospitalization.          Final Clinical Impression(s) / ED Diagnoses Final diagnoses:  COPD exacerbation [facility])    Rx / DC Orders ED Discharge Orders     None         Coral Spikes, DO 06/19/23 1601

## 2023-06-19 NOTE — Telephone Encounter (Signed)
PT left msg on Answ Ser. He is in Emer room now. States he needs Doxycycline. See past  encounters for the same req. Call with questions @ [phone]  Ilda Basset is [facility] on [address] (24 hours)

## 2023-06-19 NOTE — ED Triage Notes (Addendum)
Pt bib wheelchair, c/o shob today, with cough x "couple weeks". Portable neb and albuterol used pta. Endorses hx of afib. RT at bedside, inspiratory and expiratory wheezing noted. Denies CP. Pt adds that he took 50mg  of benadryl and predsisone pta, felt like throat was tight

## 2023-06-19 NOTE — ED Notes (Signed)
Ambulated patient in hallway with pulse ox monitor without O2. SPO2 93% RA (Ranged from 91%-95%) and HR 114 (Range 114-117). Initially not SOB with walking but half way down hall became slightly SOB.

## 2023-06-19 NOTE — ED Notes (Signed)
RT Note: Patient had to be placed on 3lpm [place] to maintain oxygen saturation at 94% Patient came in with SPO2 90 at best on room air with increased WOB and SOB

## 2023-06-19 NOTE — Telephone Encounter (Signed)
Called and spoke to patient. He wanted to make Dr. Judeth Horn aware that he currently at the ED. He has been started on steroids and abx.   Routing to Dr. Judeth Horn as an Lorain Childes

## 2023-06-20 ENCOUNTER — Encounter: Payer: Self-pay | Admitting: Pulmonary Disease

## 2023-06-20 ENCOUNTER — Emergency Department (HOSPITAL_COMMUNITY): Payer: Medicare PPO

## 2023-06-20 ENCOUNTER — Ambulatory Visit: Payer: Medicare HMO | Admitting: Pulmonary Disease

## 2023-06-20 ENCOUNTER — Inpatient Hospital Stay (HOSPITAL_COMMUNITY)
Admission: EM | Admit: 2023-06-20 | Discharge: 2023-06-24 | DRG: 193 | Disposition: A | Discharge status: Home / Self Care | Payer: Medicare PPO | Attending: Internal Medicine | Admitting: Internal Medicine

## 2023-06-20 ENCOUNTER — Encounter (HOSPITAL_COMMUNITY): Payer: Self-pay | Admitting: Internal Medicine

## 2023-06-20 DIAGNOSIS — K219 Gastro-esophageal reflux disease without esophagitis: Secondary | ICD-10-CM | POA: Diagnosis present

## 2023-06-20 DIAGNOSIS — Z794 Long term (current) use of insulin: Secondary | ICD-10-CM

## 2023-06-20 DIAGNOSIS — Z87892 Personal history of anaphylaxis: Secondary | ICD-10-CM

## 2023-06-20 DIAGNOSIS — E785 Hyperlipidemia, unspecified: Secondary | ICD-10-CM | POA: Diagnosis present

## 2023-06-20 DIAGNOSIS — Z801 Family history of malignant neoplasm of trachea, bronchus and lung: Secondary | ICD-10-CM

## 2023-06-20 DIAGNOSIS — Z1152 Encounter for screening for COVID-19: Secondary | ICD-10-CM

## 2023-06-20 DIAGNOSIS — Z8042 Family history of malignant neoplasm of prostate: Secondary | ICD-10-CM

## 2023-06-20 DIAGNOSIS — Z825 Family history of asthma and other chronic lower respiratory diseases: Secondary | ICD-10-CM

## 2023-06-20 DIAGNOSIS — Z833 Family history of diabetes mellitus: Secondary | ICD-10-CM

## 2023-06-20 DIAGNOSIS — I4891 Unspecified atrial fibrillation: Secondary | ICD-10-CM | POA: Diagnosis present

## 2023-06-20 DIAGNOSIS — E1140 Type 2 diabetes mellitus with diabetic neuropathy, unspecified: Secondary | ICD-10-CM | POA: Diagnosis present

## 2023-06-20 DIAGNOSIS — Z7984 Long term (current) use of oral hypoglycemic drugs: Secondary | ICD-10-CM

## 2023-06-20 DIAGNOSIS — J441 Chronic obstructive pulmonary disease with (acute) exacerbation: Principal | ICD-10-CM | POA: Diagnosis present

## 2023-06-20 DIAGNOSIS — Z7982 Long term (current) use of aspirin: Secondary | ICD-10-CM

## 2023-06-20 DIAGNOSIS — Z6835 Body mass index (BMI) 35.0-35.9, adult: Secondary | ICD-10-CM

## 2023-06-20 DIAGNOSIS — I422 Other hypertrophic cardiomyopathy: Secondary | ICD-10-CM | POA: Diagnosis present

## 2023-06-20 DIAGNOSIS — E669 Obesity, unspecified: Secondary | ICD-10-CM | POA: Diagnosis present

## 2023-06-20 DIAGNOSIS — J101 Influenza due to other identified influenza virus with other respiratory manifestations: Principal | ICD-10-CM | POA: Diagnosis present

## 2023-06-20 DIAGNOSIS — Z888 Allergy status to other drugs, medicaments and biological substances status: Secondary | ICD-10-CM

## 2023-06-20 DIAGNOSIS — Z886 Allergy status to analgesic agent status: Secondary | ICD-10-CM

## 2023-06-20 DIAGNOSIS — F419 Anxiety disorder, unspecified: Secondary | ICD-10-CM | POA: Diagnosis present

## 2023-06-20 DIAGNOSIS — Z5986 Financial insecurity: Secondary | ICD-10-CM

## 2023-06-20 DIAGNOSIS — D649 Anemia, unspecified: Secondary | ICD-10-CM | POA: Diagnosis present

## 2023-06-20 DIAGNOSIS — I5032 Chronic diastolic (congestive) heart failure: Secondary | ICD-10-CM | POA: Diagnosis present

## 2023-06-20 DIAGNOSIS — G4733 Obstructive sleep apnea (adult) (pediatric): Secondary | ICD-10-CM | POA: Diagnosis present

## 2023-06-20 DIAGNOSIS — Z880 Allergy status to penicillin: Secondary | ICD-10-CM

## 2023-06-20 DIAGNOSIS — F17210 Nicotine dependence, cigarettes, uncomplicated: Secondary | ICD-10-CM | POA: Diagnosis present

## 2023-06-20 DIAGNOSIS — Z79899 Other long term (current) drug therapy: Secondary | ICD-10-CM

## 2023-06-20 DIAGNOSIS — J9601 Acute respiratory failure with hypoxia: Principal | ICD-10-CM | POA: Diagnosis present

## 2023-06-20 DIAGNOSIS — Z8546 Personal history of malignant neoplasm of prostate: Secondary | ICD-10-CM

## 2023-06-20 DIAGNOSIS — I110 Hypertensive heart disease with heart failure: Secondary | ICD-10-CM | POA: Diagnosis present

## 2023-06-20 DIAGNOSIS — Z800 Family history of malignant neoplasm of digestive organs: Secondary | ICD-10-CM

## 2023-06-20 LAB — CBC WITH DIFFERENTIAL/PLATELET
Abs Immature Granulocytes: 0.06 10*3/uL (ref 0.00–0.07)
Basophils Absolute: 0 10*3/uL (ref 0.0–0.1)
Basophils Relative: 0 %
Eosinophils Absolute: 0 10*3/uL (ref 0.0–0.5)
Eosinophils Relative: 0 %
HCT: 44.6 % (ref 39.0–52.0)
Hemoglobin: 14.8 g/dL (ref 13.0–17.0)
Immature Granulocytes: 1 %
Lymphocytes Relative: 6 %
Lymphs Abs: 0.7 10*3/uL (ref 0.7–4.0)
MCH: 31.5 pg (ref 26.0–34.0)
MCHC: 33.2 g/dL (ref 30.0–36.0)
MCV: 94.9 fL (ref 80.0–100.0)
Monocytes Absolute: 0.5 10*3/uL (ref 0.1–1.0)
Monocytes Relative: 4 %
Neutro Abs: 10.5 10*3/uL — ABNORMAL HIGH (ref 1.7–7.7)
Neutrophils Relative %: 89 %
Platelets: 402 10*3/uL — ABNORMAL HIGH (ref 150–400)
RBC: 4.7 MIL/uL (ref 4.22–5.81)
RDW: 13.4 % (ref 11.5–15.5)
WBC: 11.7 10*3/uL — ABNORMAL HIGH (ref 4.0–10.5)
nRBC: 0 % (ref 0.0–0.2)

## 2023-06-20 LAB — GLUCOSE, CAPILLARY
Glucose-Capillary: 181 mg/dL — ABNORMAL HIGH (ref 70–99)
Glucose-Capillary: 258 mg/dL — ABNORMAL HIGH (ref 70–99)

## 2023-06-20 LAB — BASIC METABOLIC PANEL
Anion gap: 16 — ABNORMAL HIGH (ref 5–15)
BUN: 19 mg/dL (ref 8–23)
CO2: 22 mmol/L (ref 22–32)
Calcium: 9.7 mg/dL (ref 8.9–10.3)
Chloride: 101 mmol/L (ref 98–111)
Creatinine, Ser: 1.24 mg/dL (ref 0.61–1.24)
GFR, Estimated: >60 mL/min (ref >60)
Glucose, Bld: 223 mg/dL — ABNORMAL HIGH (ref 70–99)
Potassium: 3.7 mmol/L (ref 3.5–5.1)
Sodium: 139 mmol/L (ref 135–145)

## 2023-06-20 LAB — RESP PANEL BY RT-PCR (RSV, FLU A&B, COVID)  RVPGX2
Influenza A by PCR: POSITIVE — AB
Influenza B by PCR: NEGATIVE
Resp Syncytial Virus by PCR: NEGATIVE
SARS Coronavirus 2 by RT PCR: NEGATIVE

## 2023-06-20 LAB — HIV ANTIBODY (ROUTINE TESTING W REFLEX): HIV Screen 4th Generation wRfx: NONREACTIVE

## 2023-06-20 LAB — BRAIN NATRIURETIC PEPTIDE: B Natriuretic Peptide: 70.4 pg/mL (ref 0.0–100.0)

## 2023-06-20 LAB — HEMOGLOBIN A1C
Hgb A1c MFr Bld: 7.3 % — ABNORMAL HIGH (ref 4.8–5.6)
Mean Plasma Glucose: 162.81 mg/dL

## 2023-06-20 LAB — TROPONIN I (HIGH SENSITIVITY): Troponin I (High Sensitivity): 13 ng/L (ref <18)

## 2023-06-20 LAB — CBG MONITORING, ED: Glucose-Capillary: 206 mg/dL — ABNORMAL HIGH (ref 70–99)

## 2023-06-20 MED ORDER — METOPROLOL SUCCINATE ER 50 MG PO TB24
150.0000 mg | ORAL_TABLET | Freq: Every day | ORAL | Status: DC
  Administered 2023-06-21 – 2023-06-24 (×4): 150 mg via ORAL
  Filled 2023-06-20 (×5): qty 3

## 2023-06-20 MED ORDER — DIPHENHYDRAMINE HCL 25 MG PO CAPS
25.0000 mg | ORAL_CAPSULE | Freq: Once | ORAL | Status: AC | PRN
  Administered 2023-06-20: 25 mg via ORAL
  Filled 2023-06-20: qty 1

## 2023-06-20 MED ORDER — FUROSEMIDE 10 MG/ML IJ SOLN
40.0000 mg | Freq: Once | INTRAMUSCULAR | Status: AC
  Administered 2023-06-20: 40 mg via INTRAVENOUS
  Filled 2023-06-20: qty 4

## 2023-06-20 MED ORDER — ORAL CARE MOUTH RINSE: 15.0000 mL | OROMUCOSAL | Status: DC | PRN

## 2023-06-20 MED ORDER — PANTOPRAZOLE SODIUM 40 MG PO TBEC
40.0000 mg | DELAYED_RELEASE_TABLET | Freq: Every day | ORAL | Status: DC
Start: 2023-06-20 — End: 2023-06-21
  Administered 2023-06-20 – 2023-06-21 (×2): 40 mg via ORAL
  Filled 2023-06-20 (×2): qty 1

## 2023-06-20 MED ORDER — ALBUTEROL SULFATE (2.5 MG/3ML) 0.083% IN NEBU: 2.5000 mg | INHALATION_SOLUTION | RESPIRATORY_TRACT | Status: DC | PRN

## 2023-06-20 MED ORDER — BUSPIRONE HCL 10 MG PO TABS
10.0000 mg | ORAL_TABLET | Freq: Three times a day (TID) | ORAL | Status: DC | PRN
  Administered 2023-06-20 – 2023-06-24 (×6): 10 mg via ORAL
  Filled 2023-06-20: qty 1
  Filled 2023-06-20: qty 2
  Filled 2023-06-20 (×4): qty 1

## 2023-06-20 MED ORDER — MONTELUKAST SODIUM 10 MG PO TABS
10.0000 mg | ORAL_TABLET | Freq: Every day | ORAL | Status: DC
Start: 2023-06-20 — End: 2023-06-24
  Administered 2023-06-20 – 2023-06-23 (×4): 10 mg via ORAL
  Filled 2023-06-20 (×4): qty 1

## 2023-06-20 MED ORDER — ASPIRIN 81 MG PO TBEC
81.0000 mg | DELAYED_RELEASE_TABLET | Freq: Every day | ORAL | Status: DC
  Administered 2023-06-21 – 2023-06-24 (×4): 81 mg via ORAL
  Filled 2023-06-20 (×5): qty 1

## 2023-06-20 MED ORDER — ENOXAPARIN SODIUM 40 MG/0.4ML IJ SOSY
40.0000 mg | PREFILLED_SYRINGE | INTRAMUSCULAR | Status: DC
  Administered 2023-06-20 – 2023-06-24 (×5): 40 mg via SUBCUTANEOUS
  Filled 2023-06-20 (×5): qty 0.4

## 2023-06-20 MED ORDER — ACETAMINOPHEN 650 MG RE SUPP: 650.0000 mg | Freq: Four times a day (QID) | RECTAL | Status: DC | PRN

## 2023-06-20 MED ORDER — OSELTAMIVIR PHOSPHATE 75 MG PO CAPS
75.0000 mg | ORAL_CAPSULE | Freq: Two times a day (BID) | ORAL | Status: DC
  Administered 2023-06-20 – 2023-06-24 (×8): 75 mg via ORAL
  Filled 2023-06-20 (×8): qty 1

## 2023-06-20 MED ORDER — ONDANSETRON HCL 4 MG PO TABS: 4.0000 mg | ORAL_TABLET | Freq: Four times a day (QID) | ORAL | Status: DC | PRN

## 2023-06-20 MED ORDER — ALBUTEROL SULFATE (2.5 MG/3ML) 0.083% IN NEBU
10.0000 mg/h | INHALATION_SOLUTION | Freq: Once | RESPIRATORY_TRACT | Status: AC
  Administered 2023-06-20: 10 mg/h via RESPIRATORY_TRACT
  Filled 2023-06-20: qty 12

## 2023-06-20 MED ORDER — LORAZEPAM 0.5 MG PO TABS
0.5000 mg | ORAL_TABLET | ORAL | Status: DC | PRN
  Administered 2023-06-20 – 2023-06-23 (×9): 0.5 mg via ORAL
  Filled 2023-06-20 (×9): qty 1

## 2023-06-20 MED ORDER — TRAZODONE HCL 50 MG PO TABS
25.0000 mg | ORAL_TABLET | Freq: Every evening | ORAL | Status: DC | PRN
  Administered 2023-06-20: 25 mg via ORAL
  Filled 2023-06-20: qty 1

## 2023-06-20 MED ORDER — GABAPENTIN 300 MG PO CAPS
300.0000 mg | ORAL_CAPSULE | Freq: Three times a day (TID) | ORAL | Status: DC | PRN
  Administered 2023-06-21: 300 mg via ORAL
  Filled 2023-06-20: qty 1

## 2023-06-20 MED ORDER — ACETAMINOPHEN 325 MG PO TABS
650.0000 mg | ORAL_TABLET | Freq: Four times a day (QID) | ORAL | Status: DC | PRN
Start: 2023-06-20 — End: 2023-06-24

## 2023-06-20 MED ORDER — TRAMADOL HCL 50 MG PO TABS: 50.0000 mg | ORAL_TABLET | Freq: Every day | ORAL | Status: DC | PRN

## 2023-06-20 MED ORDER — METHYLPREDNISOLONE SODIUM SUCC 40 MG IJ SOLR
40.0000 mg | Freq: Two times a day (BID) | INTRAMUSCULAR | Status: DC
  Administered 2023-06-20 – 2023-06-24 (×9): 40 mg via INTRAVENOUS
  Filled 2023-06-20 (×9): qty 1

## 2023-06-20 MED ORDER — INSULIN ASPART 100 UNIT/ML IJ SOLN
0.0000 [IU] | Freq: Three times a day (TID) | INTRAMUSCULAR | Status: DC
  Administered 2023-06-20: 5 [IU] via SUBCUTANEOUS
  Administered 2023-06-20: 3 [IU] via SUBCUTANEOUS
  Administered 2023-06-21: 5 [IU] via SUBCUTANEOUS
  Administered 2023-06-21: 3 [IU] via SUBCUTANEOUS
  Administered 2023-06-21: 5 [IU] via SUBCUTANEOUS
  Administered 2023-06-22: 8 [IU] via SUBCUTANEOUS
  Administered 2023-06-22: 5 [IU] via SUBCUTANEOUS
  Administered 2023-06-22: 3 [IU] via SUBCUTANEOUS
  Administered 2023-06-23: 5 [IU] via SUBCUTANEOUS
  Administered 2023-06-23: 3 [IU] via SUBCUTANEOUS
  Administered 2023-06-23: 5 [IU] via SUBCUTANEOUS
  Administered 2023-06-24: 8 [IU] via SUBCUTANEOUS
  Filled 2023-06-20: qty 0.15

## 2023-06-20 MED ORDER — IPRATROPIUM-ALBUTEROL 0.5-2.5 (3) MG/3ML IN SOLN
3.0000 mL | Freq: Four times a day (QID) | RESPIRATORY_TRACT | Status: DC
  Administered 2023-06-21 – 2023-06-24 (×12): 3 mL via RESPIRATORY_TRACT
  Filled 2023-06-20 (×13): qty 3

## 2023-06-20 MED ORDER — ONDANSETRON HCL 4 MG/2ML IJ SOLN: 4.0000 mg | Freq: Four times a day (QID) | INTRAMUSCULAR | Status: DC | PRN

## 2023-06-20 MED ORDER — AMLODIPINE BESYLATE 5 MG PO TABS
5.0000 mg | ORAL_TABLET | Freq: Every day | ORAL | Status: DC
  Administered 2023-06-21 – 2023-06-24 (×4): 5 mg via ORAL
  Filled 2023-06-20 (×4): qty 1

## 2023-06-20 MED ORDER — INSULIN ASPART 100 UNIT/ML IJ SOLN
0.0000 [IU] | Freq: Every day | INTRAMUSCULAR | Status: DC
  Administered 2023-06-20: 3 [IU] via SUBCUTANEOUS
  Administered 2023-06-22: 2 [IU] via SUBCUTANEOUS
  Administered 2023-06-23: 3 [IU] via SUBCUTANEOUS
  Filled 2023-06-20: qty 0.05

## 2023-06-20 NOTE — ED Provider Notes (Signed)
 [place] EMERGENCY DEPARTMENT AT [facility] Provider Note   CSN: [id number] Arrival date & time: 06/20/23  9368     History  Chief Complaint  Patient presents with   Shortness of Breath    Joseph Harvey is a 68 y.o. male.  HPI Presents with worsening dyspnea.  Has multiple medical issues including CHF/hyperdynamic, COPD.  Over the past few days he has had worsening dyspnea, yesterday he went to our affiliated center.  There he received bronchodilators, steroids, diuretics.  He was on a trajectory for admission, but due to lack of availability for beds attempted to go home to see if he would feel better.  He did not, notes that he feels worse, with chest tightness, dyspnea, and spite of supplemental oxygen, bronchodilators, and steroids provided yesterday.  No reported new fever or other concerns.    Home Medications Prior to Admission medications   Medication Sig Start Date End Date Taking? Authorizing Provider  acetaminophen  (TYLENOL ) 325 MG tablet Take 2 tablets (650 mg total) by mouth every 4 (four) hours as needed for headache or mild pain. 08/11/21   Lesia Ozell Barter, PA-C  albuterol  (PROVENTIL ) (2.5 MG/3ML) 0.083% nebulizer solution TAKE 3 MLS BY NEBULIZATION EVERY 4 HOURS AS NEEDED FOR WHEEZING OR SHORTNESS OF BREATH (((PLAN B))) 11/09/22   Hunsucker, Donnice SAUNDERS, MD  albuterol  (VENTOLIN  HFA) 108 (90 Base) MCG/ACT inhaler Inhale 2 puffs into the lungs every 4 hours as needed for shortness of breath or wheezing 01/11/23   Newlin, Enobong, MD  amLODipine  (NORVASC ) 5 MG tablet Take 1 tablet (5 mg total) by mouth in the morning and at bedtime. 06/02/23   Meng, Hao, PA  ASPIRIN  81 PO Take 81 mg by mouth daily.    [provider]  azithromycin  (ZITHROMAX ) 250 MG tablet Take 250 mg by mouth as directed. 02/15/23   [provider]  Blood Glucose Monitoring Suppl (TRUE METRIX METER) w/Device KIT Use as directed 01/07/19   Vicci Barnie NOVAK, MD   Budeson-Glycopyrrol-Formoterol  (BREZTRI  AEROSPHERE) 160-9-4.8 MCG/ACT AERO Inhale 2 puffs into the lungs in the morning and at bedtime. 02/20/23   Hunsucker, Donnice SAUNDERS, MD  busPIRone  (BUSPAR ) 10 MG tablet Take 1 tablet (10 mg total) by mouth 3 (three) times daily. Patient taking differently: Take 10 mg by mouth 3 (three) times daily as needed (anxiety). 09/11/20   Vicci Barnie NOVAK, MD  Cholecalciferol  (VITAMIN D PO) Take 1 tablet by mouth daily.    [provider]  doxycycline  (VIBRA -TABS) 100 MG tablet Take 1 tablet (100 mg total) by mouth 2 (two) times daily. 05/29/23   Hunsucker, Donnice SAUNDERS, MD  Dupilumab  (DUPIXENT ) 300 MG/2ML SOAJ Inject 300 mg into the skin every 14 (fourteen) days. **loading dose completed in clinic on 12/15/22** 05/03/23   Hunsucker, Donnice SAUNDERS, MD  ezetimibe  (ZETIA ) 10 MG tablet Take 1 tablet (10 mg total) by mouth daily. 01/11/23   Kate Lonni CROME, MD  furosemide  (LASIX ) 40 MG tablet Take 40 mg daily if gains 3 lbs overnight or 5 lbs in 1 week 03/14/23   Vicci Rollo SAUNDERS, PA-C  gabapentin  (NEURONTIN ) 300 MG capsule take 1 capsule by mouth in the morning, then take 1 at lunch and 2 at bedtime Patient taking differently: Take 300 mg by mouth 3 (three) times daily as needed (Nerve pain). 12/29/20     glucose blood (TRUE METRIX BLOOD GLUCOSE TEST) test strip Use as instructed 01/07/19   Vicci Barnie NOVAK, MD  ipratropium (ATROVENT ) 0.06 %  nasal spray Place 2 sprays into both nostrils 3 (three) times daily. As needed for nasal congestion, runny nose 03/20/23   Hunsucker, Donnice SAUNDERS, MD  ipratropium-albuterol  (DUONEB) 0.5-2.5 (3) MG/3ML SOLN INHALE 1 VIAL VIA NEBULIZER TWICE A DAY 01/13/22   Vicci Barnie NOVAK, MD  lidocaine  (LIDODERM ) 5 % Apply 1 patch to affected area daily and leave on for 12 hours at a time.  Remove after 12 hours 01/05/22   Vicci Barnie NOVAK, MD  linaclotide  (LINZESS ) 72 MCG capsule Take 1 capsule (72 mcg total) by mouth daily before breakfast.  06/22/22   Vicci Barnie NOVAK, MD  metFORMIN  (GLUCOPHAGE -XR) 500 MG 24 hr tablet Take 2 tablets (1,000 mg total) by mouth in the morning AND 1 tablet (500 mg total) every evening with meals. 01/24/23   Vicci Barnie NOVAK, MD  metoprolol  succinate (TOPROL -XL) 100 MG 24 hr tablet Take 1 tablet (100 mg total) by mouth daily. Take with or immediately following a meal. 05/29/23 08/27/23  Vicci Rollo SAUNDERS, PA-C  metoprolol  succinate (TOPROL -XL) 50 MG 24 hr tablet Take 1 tablet (50 mg total) by mouth daily. Take with or immediately following a meal. 05/25/23 08/23/23  Vicci Rollo SAUNDERS, PA-C  montelukast  (SINGULAIR ) 10 MG tablet Take 1 tablet (10 mg total) by mouth at bedtime. 01/11/22   Vicci Barnie NOVAK, MD  omeprazole (PRILOSEC) 40 MG capsule Take 40 mg by mouth every morning. 10/28/22   [provider]  pantoprazole  (PROTONIX ) 40 MG tablet Take 1 tablet (40 mg total) by mouth daily as needed. 11/18/22   Vicci Barnie NOVAK, MD  potassium chloride  SA (KLOR-CON  M) 20 MEQ tablet Take 1 tablet (20 mEq total) by mouth daily as needed (When hands cramp up). 12/26/21   Brien Belvie BRAVO, MD  predniSONE  (DELTASONE ) 10 MG tablet Take 3 tablets (30 mg total) by mouth daily as needed. 05/29/23   Hunsucker, Donnice SAUNDERS, MD  Respiratory Therapy Supplies (FLUTTER) DEVI Use three times a day after inhaler or nebulizer use 12/10/18   Brien Belvie BRAVO, MD  rosuvastatin  (CRESTOR ) 20 MG tablet Take 1 tablet (20 mg total) by mouth daily. 02/02/22   Kate Lonni CROME, MD  Semaglutide  (RYBELSUS) 7 MG TABS Take 1 tablet by mouth daily.    [provider]  silver  sulfADIAZINE  (SILVADENE ) 1 % cream Apply twice a day to affected area 10/14/22   Vicci Barnie NOVAK, MD  traMADol  (ULTRAM ) 50 MG tablet Take 1 tablet (50 mg total) by mouth every evening. Each prescription to last 1 mth 09/28/22   Vicci Barnie NOVAK, MD  TRUEplus Lancets 28G MISC Use as directed 01/07/19   Vicci Barnie NOVAK, MD  valsartan  (DIOVAN ) 320 MG  tablet Take 1 tablet (320 mg total) by mouth daily. 10/05/22 10/05/23  Kate Lonni CROME, MD      Allergies    Amoxicillin , Lisinopril, Molds & smuts, Robitussin [guaifenesin ], and Azithromycin     Review of Systems   Review of Systems  Physical Exam Updated Vital Signs BP (!) 110/93   Pulse (!) 101   Temp 98.6 F (37 C) (Oral)   Resp (!) 24   SpO2 96%  Physical Exam Vitals and nursing note reviewed.  Constitutional:      Appearance: He is obese. He is diaphoretic.  HENT:     Head: Normocephalic.  Cardiovascular:     Rate and Rhythm: Normal rate and regular rhythm.  Pulmonary:     Effort: Tachypnea, accessory muscle usage and respiratory distress (moderate) present.  Breath sounds: Decreased breath sounds and wheezing present.  Musculoskeletal:     Cervical back: Normal range of motion.     Right lower leg: Edema present.     Left lower leg: Edema present.  Skin:    General: Skin is warm.     Capillary Refill: Capillary refill takes less than 2 seconds.  Neurological:     General: No focal deficit present.  Psychiatric:        Mood and Affect: Mood normal.        Behavior: Behavior normal.     ED Results / Procedures / Treatments   Labs (all labs ordered are listed, but only abnormal results are displayed) Labs Reviewed  BASIC METABOLIC PANEL - Abnormal; Notable for the following components:      Result Value   Glucose, Bld 223 (*)    Anion gap 16 (*)    All other components within normal limits  CBC WITH DIFFERENTIAL/PLATELET - Abnormal; Notable for the following components:   WBC 11.7 (*)    Platelets 402 (*)    Neutro Abs 10.5 (*)    All other components within normal limits  RESP PANEL BY RT-PCR (RSV, FLU A&B, COVID)  RVPGX2  BRAIN NATRIURETIC PEPTIDE  TROPONIN I (HIGH SENSITIVITY)    EKG EKG Interpretation Date/Time:  Tuesday June 20 2023 06:47:42 EST Ventricular Rate:  118 PR Interval:  98 QRS Duration:  81 QT Interval:  349 QTC  Calculation: 489 R Axis:   76  Text Interpretation: Sinus tachycardia Anteroseptal infarct, old Borderline T abnormalities, inferior leads When compared with ECG of 06/19/2023, No significant change was found Confirmed by Raford Lenis ([id number]) on 06/20/2023 6:50:00 AM  Radiology DG Chest 2 View Result Date: 06/20/2023 CLINICAL DATA:  shortness of breath.  Asthma EXAM: CHEST - 2 VIEW COMPARISON:  Chest x-ray 06/19/2023, PET CT 02/10/2023, chest x-ray 09/23/2022 FINDINGS: The heart and mediastinal contours are within normal limits. No focal consolidation. No pulmonary edema. No pleural effusion. No pneumothorax. No acute osseous abnormality. IMPRESSION: No active cardiopulmonary disease. Electronically Signed   By: Morgane  Naveau M.D.   On: 06/20/2023 07:56   DG Chest Portable 1 View Result Date: 06/19/2023 CLINICAL DATA:  Shortness of breath. EXAM: PORTABLE CHEST 1 VIEW COMPARISON:  Chest radiograph dated 09/23/2022. FINDINGS: Minimal bibasilar interstitial streaky atelectasis. No focal consolidation, pleural effusion, or pneumothorax. The cardiac silhouette is within normal limits. No acute osseous pathology. IMPRESSION: No active disease. Electronically Signed   By: Vanetta Chou M.D.   On: 06/19/2023 15:23    Procedures Procedures    Medications Ordered in ED Medications  albuterol  (PROVENTIL ,VENTOLIN ) solution continuous neb (has no administration in time range)  furosemide  (LASIX ) injection 40 mg (has no administration in time range)  furosemide  (LASIX ) injection 40 mg (40 mg Intravenous Given 06/20/23 0743)    ED Course/ Medical Decision Making/ A&P                                 Medical Decision Making Adult male, history of COPD, hyperdynamic heart rate now presents with worsening dyspnea after being evaluated yesterday.  Patient's preserved mental status is somewhat reassuring, but given his increased work of breathing, tachypnea, tachycardia, concern for multifactorial respiratory  compromise, requiring supplemental oxygen.  Patient received continuous bronchodilator here, x-ray, labs, performed in triage notable for no pneumonia, which is also somewhat reassuring.  However, given his increased work of  breathing, respiratory compromise, after continuous albuterol , additional steroids, IV diuretics, the patient was admitted for further monitoring, management.  Amount and/or Complexity of Data Reviewed External Data Reviewed: notes.    Details: Notes from yesterday reviewed Labs:  Decision-making details documented in ED Course. Radiology: independent interpretation performed. Decision-making details documented in ED Course. ECG/medicine tests: independent interpretation performed. Decision-making details documented in ED Course.  Risk Prescription drug management. Decision regarding hospitalization. Diagnosis or treatment significantly limited by social determinants of health.  On exam, he and I discussed options for improvement, he states that BiPAP was not previously helped him.  He will receive bronchodilator, diuresis.   On repeat exam patient continues to require supplemental oxygen, continuous bronchodilator starting.  CRITICAL CARE Performed by: Lamar Salen Total critical care time: 35 minutes Critical care time was exclusive of separately billable procedures and treating other patients. Critical care was necessary to treat or prevent imminent or life-threatening deterioration. Critical care was time spent personally by me on the following activities: development of treatment plan with patient and/or surrogate as well as nursing, discussions with consultants, evaluation of patient's response to treatment, examination of patient, obtaining history from patient or surrogate, ordering and performing treatments and interventions, ordering and review of laboratory studies, ordering and review of radiographic studies, pulse oximetry and re-evaluation of patient's  condition.  Final Clinical Impression(s) / ED Diagnoses Final diagnoses:  Acute respiratory failure with hypoxia [facility])    Rx / DC Orders ED Discharge Orders     None         Salen Lamar, MD 06/20/23 [phone]

## 2023-06-20 NOTE — ED Triage Notes (Signed)
 Pt went to [facility] and was admitted but decided to leave r/t not having any beds. Pt went [facility]for [facility] and afib.   When pt went home , he woke up at 2 am , gagging for air , pt reports he went outside for air and it help. Pt noticeably labored in triage, RR 32 , sating 86 on RA, pt does not wear 02 at baseline. Pt with hx of asthma, afib with ablation, DM2 ,HTN.

## 2023-06-20 NOTE — ED Provider Notes (Signed)
 MSE was initiated and I personally evaluated the patient and placed orders (if any) at  6:57 AM on June 20, 2023.  The patient appears stable so that the remainder of the MSE may be completed by another provider.  He was in emergency yesterday with dyspnea, refused admission at that time.  Noted increased shortness of breath last night.  He does endorse a nonproductive cough, no fever or chills.  On exam, he is dyspneic at rest, lungs without rales or wheezes but with a prolonged exhalation phase.  He does have 1+ pretibial edema.   Raford Lenis, MD 06/20/23 [phone]

## 2023-06-20 NOTE — H&P (Addendum)
 History and Physical  Granville Whitefield FMW:[id number] DOB: Feb 19, 1956 DOA: 06/20/2023  PCP: Arloa Jarvis, NP   Chief Complaint: Cough, shortness of breath  HPI: Arizona Nordquist is a 68 y.o. male with medical history significant for COPD/asthma, obesity, non-insulin -dependent type 2 diabetes, GERD, hypertension, A-fib status post ablation being admitted to the hospital with acute exacerbation of COPD.  Patient states he has been having dyspnea, cough productive of yellow sputum for the last 3 to 4 days.  He was evaluated at [facility] yesterday given IV Solu-Medrol , breathing treatments, dose of IV Lasix  and was offered admission to the hospital.  He refused at the time and left AMA.  Overnight, he again woke up very dyspneic and short of breath unable to catch his breath, so came to the ER for evaluation early this morning.  Workup as below consistent with continued exacerbation of COPD, admitted to the hospitalist service.  Review of Systems: Please see HPI for pertinent positives and negatives. A complete 10 system review of systems are otherwise negative.  Past Medical History:  Diagnosis Date   Allergy     Dust, mold, dust mites   Anemia    Anxiety    Asthma    Cancer (HCC)    prostate   Cataract    bilateral repair.   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   Pneumonia    Stress incontinence    Past Surgical History:  Procedure Laterality Date   BACK SURGERY  2017   CATARACT EXTRACTION Bilateral    COLONOSCOPY     2013   COLONOSCOPY  10/03/2019   KNEE ARTHROSCOPY  2005   right   PROSTATE BIOPSY     PROSTATE SURGERY     PVC ABLATION N/A 08/10/2021   Procedure: PVC ABLATION;  Surgeon: Inocencio Soyla Lunger, MD;  Location: [facility];  Service: Cardiovascular;  Laterality: N/A;   ROBOT ASSISTED  LAPAROSCOPIC RADICAL PROSTATECTOMY  03/2010   Social History:  reports that he has been smoking cigarettes. He started smoking about 49 years ago. He has a 36.8 pack-year smoking history. He has never used smokeless tobacco. He reports that he does not currently use alcohol after a past usage of about 3.0 standard drinks of alcohol per week. He reports that he does not use drugs.  Allergies  Allergen Reactions   Amoxicillin  Anaphylaxis   Lisinopril Shortness Of Breath   Molds & Smuts Anaphylaxis   Robitussin [Guaifenesin ] Shortness Of Breath    wheezing   Azithromycin  Other (See Comments)    Family History  Problem Relation Age of Onset   Colon cancer Father    Lung cancer Father        was a smoker   Prostate cancer Father    Lung cancer Mother        was a smoker   Asthma Mother    Allergies Brother    Diabetes Maternal Grandmother    Colon polyps Neg Hx    Esophageal cancer Neg Hx    Stomach cancer Neg Hx    Rectal cancer Neg Hx      Prior to Admission medications   Medication Sig Start Date End Date Taking? Authorizing Provider  acetaminophen  (TYLENOL ) 325 MG tablet Take 2 tablets (650 mg total) by mouth every  4 (four) hours as needed for headache or mild pain. 08/11/21   Lesia Ozell Barter, PA-C  albuterol  (PROVENTIL ) (2.5 MG/3ML) 0.083% nebulizer solution TAKE 3 MLS BY NEBULIZATION EVERY 4 HOURS AS NEEDED FOR WHEEZING OR SHORTNESS OF BREATH (((PLAN B))) 11/09/22   Hunsucker, Donnice SAUNDERS, MD  albuterol  (VENTOLIN  HFA) 108 (90 Base) MCG/ACT inhaler Inhale 2 puffs into the lungs every 4 hours as needed for shortness of breath or wheezing 01/11/23   Newlin, Enobong, MD  amLODipine  (NORVASC ) 5 MG tablet Take 1 tablet (5 mg total) by mouth in the morning and at bedtime. 06/02/23   Meng, Hao, PA  ASPIRIN  81 PO Take 81 mg by mouth daily.    [provider]  azithromycin  (ZITHROMAX ) 250 MG tablet Take 250 mg by mouth as directed. 02/15/23   [provider]   Budeson-Glycopyrrol-Formoterol  (BREZTRI  AEROSPHERE) 160-9-4.8 MCG/ACT AERO Inhale 2 puffs into the lungs in the morning and at bedtime. 02/20/23   Hunsucker, Donnice SAUNDERS, MD  busPIRone  (BUSPAR ) 10 MG tablet Take 1 tablet (10 mg total) by mouth 3 (three) times daily. Patient taking differently: Take 10 mg by mouth 3 (three) times daily as needed (anxiety). 09/11/20   Vicci Barnie NOVAK, MD  Cholecalciferol  (VITAMIN D PO) Take 1 tablet by mouth daily.    [provider]  doxycycline  (VIBRA -TABS) 100 MG tablet Take 1 tablet (100 mg total) by mouth 2 (two) times daily. 05/29/23   Hunsucker, Donnice SAUNDERS, MD  Dupilumab  (DUPIXENT ) 300 MG/2ML SOAJ Inject 300 mg into the skin every 14 (fourteen) days. **loading dose completed in clinic on 12/15/22** 05/03/23   Hunsucker, Donnice SAUNDERS, MD  ezetimibe  (ZETIA ) 10 MG tablet Take 1 tablet (10 mg total) by mouth daily. 01/11/23   Kate Lonni CROME, MD  furosemide  (LASIX ) 40 MG tablet Take 40 mg daily if gains 3 lbs overnight or 5 lbs in 1 week 03/14/23   Vicci Rollo SAUNDERS, PA-C  gabapentin  (NEURONTIN ) 300 MG capsule take 1 capsule by mouth in the morning, then take 1 at lunch and 2 at bedtime Patient taking differently: Take 300 mg by mouth 3 (three) times daily as needed (Nerve pain). 12/29/20     glucose blood (TRUE METRIX BLOOD GLUCOSE TEST) test strip Use as instructed 01/07/19   Vicci Barnie NOVAK, MD  ipratropium (ATROVENT ) 0.06 % nasal spray Place 2 sprays into both nostrils 3 (three) times daily. As needed for nasal congestion, runny nose 03/20/23   Hunsucker, Donnice SAUNDERS, MD  ipratropium-albuterol  (DUONEB) 0.5-2.5 (3) MG/3ML SOLN INHALE 1 VIAL VIA NEBULIZER TWICE A DAY 01/13/22   Vicci Barnie NOVAK, MD  lidocaine  (LIDODERM ) 5 % Apply 1 patch to affected area daily and leave on for 12 hours at a time.  Remove after 12 hours 01/05/22   Vicci Barnie NOVAK, MD  linaclotide  (LINZESS ) 72 MCG capsule Take 1 capsule (72 mcg total) by mouth daily before breakfast.  06/22/22   Vicci Barnie NOVAK, MD  metFORMIN  (GLUCOPHAGE -XR) 500 MG 24 hr tablet Take 2 tablets (1,000 mg total) by mouth in the morning AND 1 tablet (500 mg total) every evening with meals. 01/24/23   Vicci Barnie NOVAK, MD  metoprolol  succinate (TOPROL -XL) 100 MG 24 hr tablet Take 1 tablet (100 mg total) by mouth daily. Take with or immediately following a meal. 05/29/23 08/27/23  Vicci Rollo SAUNDERS, PA-C  metoprolol  succinate (TOPROL -XL) 50 MG 24 hr tablet Take 1 tablet (50 mg total) by mouth daily. Take with or immediately following a meal. 05/25/23  08/23/23  Vicci Rollo SAUNDERS, PA-C  montelukast  (SINGULAIR ) 10 MG tablet Take 1 tablet (10 mg total) by mouth at bedtime. 01/11/22   Vicci Barnie NOVAK, MD  omeprazole (PRILOSEC) 40 MG capsule Take 40 mg by mouth every morning. 10/28/22   [provider]  pantoprazole  (PROTONIX ) 40 MG tablet Take 1 tablet (40 mg total) by mouth daily as needed. 11/18/22   Vicci Barnie NOVAK, MD  potassium chloride  SA (KLOR-CON  M) 20 MEQ tablet Take 1 tablet (20 mEq total) by mouth daily as needed (When hands cramp up). 12/26/21   Brien Belvie BRAVO, MD  predniSONE  (DELTASONE ) 10 MG tablet Take 3 tablets (30 mg total) by mouth daily as needed. 05/29/23   Hunsucker, Donnice SAUNDERS, MD  rosuvastatin  (CRESTOR ) 20 MG tablet Take 1 tablet (20 mg total) by mouth daily. 02/02/22   Kate Lonni CROME, MD  Semaglutide  (RYBELSUS) 7 MG TABS Take 1 tablet by mouth daily.    [provider]  silver  sulfADIAZINE  (SILVADENE ) 1 % cream Apply twice a day to affected area 10/14/22   Vicci Barnie NOVAK, MD  traMADol  (ULTRAM ) 50 MG tablet Take 1 tablet (50 mg total) by mouth every evening. Each prescription to last 1 mth 09/28/22   Vicci Barnie NOVAK, MD  TRUEplus Lancets 28G MISC Use as directed 01/07/19   Vicci Barnie NOVAK, MD  valsartan  (DIOVAN ) 320 MG tablet Take 1 tablet (320 mg total) by mouth daily. 10/05/22 10/05/23  Kate Lonni CROME, MD    Physical Exam: BP (!) 148/93    Pulse 99   Temp 98.6 F (37 C) (Oral)   Resp (!) 24   SpO2 98%  General:  Alert, oriented, calm, in no acute distress, wearing 3 L nasal cannula oxygen, getting continuous neb.  Speaking in full sentences, becomes dyspneic after several minutes of speaking. Cardiovascular: Tachycardic and regular, no murmurs or rubs, he has 1+ pitting bilateral lower extremity edema, which is nonerythematous, nontender Respiratory: He has good equal bilateral air entry, no rhonchi, some prolonged expiratory phase, with wheezing Abdomen: soft, nontender, nondistended, normal bowel tones heard  Skin: dry, no rashes  Musculoskeletal: no joint effusions, normal range of motion  Psychiatric: appropriate affect, normal speech  Neurologic: extraocular muscles intact, clear speech, moving all extremities with intact sensorium         Labs on Admission:  Basic Metabolic Panel: Recent Labs  Lab 06/19/23 1412 06/20/23 0651  NA 140 139  K 3.5 3.7  CL 104 101  CO2 25 22  GLUCOSE 159* 223*  BUN 11 19  CREATININE 0.98 1.24  CALCIUM  9.3 9.7   Liver Function Tests: No results for input(s): AST, ALT, ALKPHOS, BILITOT, PROT, ALBUMIN in the last 168 hours. No results for input(s): LIPASE, AMYLASE in the last 168 hours. No results for input(s): AMMONIA in the last 168 hours. CBC: Recent Labs  Lab 06/19/23 1412 06/20/23 0651  WBC 7.5 11.7*  NEUTROABS  --  10.5*  HGB 13.5 14.8  HCT 38.7* 44.6  MCV 91.9 94.9  PLT 307 402*   Cardiac Enzymes: No results for input(s): CKTOTAL, CKMB, CKMBINDEX, TROPONINI in the last 168 hours. BNP (last 3 results) Recent Labs    09/23/22 1421 06/19/23 1412 06/20/23 0651  BNP 85.9 50.7 70.4    ProBNP (last 3 results) No results for input(s): PROBNP in the last 8760 hours.  CBG: No results for input(s): GLUCAP in the last 168 hours.  Radiological Exams on Admission: DG Chest 2 View Result Date:  06/20/2023 CLINICAL DATA:  shortness of  breath.  Asthma EXAM: CHEST - 2 VIEW COMPARISON:  Chest x-ray 06/19/2023, PET CT 02/10/2023, chest x-ray 09/23/2022 FINDINGS: The heart and mediastinal contours are within normal limits. No focal consolidation. No pulmonary edema. No pleural effusion. No pneumothorax. No acute osseous abnormality. IMPRESSION: No active cardiopulmonary disease. Electronically Signed   By: Morgane  Naveau M.D.   On: 06/20/2023 07:56   DG Chest Portable 1 View Result Date: 06/19/2023 CLINICAL DATA:  Shortness of breath. EXAM: PORTABLE CHEST 1 VIEW COMPARISON:  Chest radiograph dated 09/23/2022. FINDINGS: Minimal bibasilar interstitial streaky atelectasis. No focal consolidation, pleural effusion, or pneumothorax. The cardiac silhouette is within normal limits. No acute osseous pathology. IMPRESSION: No active disease. Electronically Signed   By: Vanetta Chou M.D.   On: 06/19/2023 15:23   Assessment/Plan Deandrae Wajda is a 68 y.o. male with medical history significant for COPD/asthma, obesity, non-insulin -dependent type 2 diabetes, GERD, hypertension, A-fib status post ablation being admitted to the hospital with acute exacerbation of COPD.  Acute exacerbation of COPD-with increased cough, dyspnea, sputum production, no chest pain, fevers.  Chest x-ray with no active disease, BNP is normal. -Observation admission -IV Solu-Medrol  40 mg twice daily -Incentive spirometer and flutter valve -Scheduled DuoNebs, with as needed albuterol  -Supplemental oxygen as needed, wean as tolerated to keep O2 saturation greater than 90%  Leukocytosis-mild, suspect this is due to having received IV Solu-Medrol  in the ER yesterday  Heart failure with preserved EF-history of grade 1 diastolic dysfunction, BNP is normal, he has some mild lower extremity edema.  Received IV Lasix  in the emergency department.  GERD-Protonix   Type 2 diabetes-hold home metformin , carb modified diet, moderate dose sliding scale in the meantime.  Will update  hemoglobin A1c.  Will need to monitor closely for hyperglycemia in the setting of receiving IV steroids.  Hypertension, history of A-fib-continue metoprolol , amlodipine   Anxiety-BuSpar  as needed  Peripheral neuropathy-gabapentin  as needed  OSA-CPAP at night  DVT prophylaxis: Lovenox      Code Status: Full Code  Consults called: None  Admission status: Observation  Time spent: 49 minutes  Dalal Livengood CHRISTELLA Gail MD [facility] Pager [phone]  If 7PM-7AM, please contact night-coverage [email or web address] Password [email or web address]  06/20/2023, 10:24 AM

## 2023-06-21 ENCOUNTER — Telehealth: Payer: Self-pay | Admitting: Pulmonary Disease

## 2023-06-21 DIAGNOSIS — Z7984 Long term (current) use of oral hypoglycemic drugs: Secondary | ICD-10-CM | POA: Diagnosis not present

## 2023-06-21 DIAGNOSIS — F419 Anxiety disorder, unspecified: Secondary | ICD-10-CM | POA: Diagnosis present

## 2023-06-21 DIAGNOSIS — I4891 Unspecified atrial fibrillation: Secondary | ICD-10-CM | POA: Diagnosis present

## 2023-06-21 DIAGNOSIS — J441 Chronic obstructive pulmonary disease with (acute) exacerbation: Secondary | ICD-10-CM | POA: Diagnosis present

## 2023-06-21 DIAGNOSIS — G4733 Obstructive sleep apnea (adult) (pediatric): Secondary | ICD-10-CM | POA: Diagnosis present

## 2023-06-21 DIAGNOSIS — Z7982 Long term (current) use of aspirin: Secondary | ICD-10-CM | POA: Diagnosis not present

## 2023-06-21 DIAGNOSIS — E785 Hyperlipidemia, unspecified: Secondary | ICD-10-CM | POA: Diagnosis present

## 2023-06-21 DIAGNOSIS — I422 Other hypertrophic cardiomyopathy: Secondary | ICD-10-CM | POA: Diagnosis present

## 2023-06-21 DIAGNOSIS — Z8546 Personal history of malignant neoplasm of prostate: Secondary | ICD-10-CM | POA: Diagnosis not present

## 2023-06-21 DIAGNOSIS — Z1152 Encounter for screening for COVID-19: Secondary | ICD-10-CM | POA: Diagnosis not present

## 2023-06-21 DIAGNOSIS — J101 Influenza due to other identified influenza virus with other respiratory manifestations: Secondary | ICD-10-CM | POA: Diagnosis present

## 2023-06-21 DIAGNOSIS — K219 Gastro-esophageal reflux disease without esophagitis: Secondary | ICD-10-CM | POA: Diagnosis present

## 2023-06-21 DIAGNOSIS — E669 Obesity, unspecified: Secondary | ICD-10-CM | POA: Diagnosis present

## 2023-06-21 DIAGNOSIS — F17210 Nicotine dependence, cigarettes, uncomplicated: Secondary | ICD-10-CM | POA: Diagnosis present

## 2023-06-21 DIAGNOSIS — J9601 Acute respiratory failure with hypoxia: Secondary | ICD-10-CM | POA: Diagnosis present

## 2023-06-21 DIAGNOSIS — I5032 Chronic diastolic (congestive) heart failure: Secondary | ICD-10-CM | POA: Diagnosis present

## 2023-06-21 DIAGNOSIS — Z5986 Financial insecurity: Secondary | ICD-10-CM | POA: Diagnosis not present

## 2023-06-21 DIAGNOSIS — I110 Hypertensive heart disease with heart failure: Secondary | ICD-10-CM | POA: Diagnosis present

## 2023-06-21 DIAGNOSIS — Z886 Allergy status to analgesic agent status: Secondary | ICD-10-CM | POA: Diagnosis not present

## 2023-06-21 DIAGNOSIS — Z6835 Body mass index (BMI) 35.0-35.9, adult: Secondary | ICD-10-CM | POA: Diagnosis not present

## 2023-06-21 DIAGNOSIS — E1140 Type 2 diabetes mellitus with diabetic neuropathy, unspecified: Secondary | ICD-10-CM | POA: Diagnosis present

## 2023-06-21 DIAGNOSIS — Z794 Long term (current) use of insulin: Secondary | ICD-10-CM | POA: Diagnosis not present

## 2023-06-21 DIAGNOSIS — D649 Anemia, unspecified: Secondary | ICD-10-CM | POA: Diagnosis present

## 2023-06-21 LAB — RESPIRATORY PANEL BY PCR

## 2023-06-21 LAB — CBC
HCT: 42.8 % (ref 39.0–52.0)
Hemoglobin: 14.2 g/dL (ref 13.0–17.0)
MCH: 31.9 pg (ref 26.0–34.0)
MCHC: 33.2 g/dL (ref 30.0–36.0)
MCV: 96.2 fL (ref 80.0–100.0)
Platelets: 366 10*3/uL (ref 150–400)
RBC: 4.45 MIL/uL (ref 4.22–5.81)
RDW: 13.6 % (ref 11.5–15.5)
WBC: 11.5 10*3/uL — ABNORMAL HIGH (ref 4.0–10.5)
nRBC: 0 % (ref 0.0–0.2)

## 2023-06-21 LAB — BASIC METABOLIC PANEL
Anion gap: 12 (ref 5–15)
BUN: 41 mg/dL — ABNORMAL HIGH (ref 8–23)
CO2: 25 mmol/L (ref 22–32)
Calcium: 9.3 mg/dL (ref 8.9–10.3)
Chloride: 99 mmol/L (ref 98–111)
Creatinine, Ser: 1.49 mg/dL — ABNORMAL HIGH (ref 0.61–1.24)
GFR, Estimated: 51 mL/min — ABNORMAL LOW (ref >60)
Glucose, Bld: 222 mg/dL — ABNORMAL HIGH (ref 70–99)
Potassium: 4 mmol/L (ref 3.5–5.1)
Sodium: 136 mmol/L (ref 135–145)

## 2023-06-21 LAB — GLUCOSE, CAPILLARY
Glucose-Capillary: 190 mg/dL — ABNORMAL HIGH (ref 70–99)
Glucose-Capillary: 233 mg/dL — ABNORMAL HIGH (ref 70–99)
Glucose-Capillary: 220 mg/dL — ABNORMAL HIGH (ref 70–99)
Glucose-Capillary: 173 mg/dL — ABNORMAL HIGH (ref 70–99)

## 2023-06-21 MED ORDER — DM-GUAIFENESIN ER 30-600 MG PO TB12
1.0000 | ORAL_TABLET | Freq: Two times a day (BID) | ORAL | Status: DC
  Administered 2023-06-21 – 2023-06-24 (×7): 1 via ORAL
  Filled 2023-06-21 (×7): qty 1

## 2023-06-21 MED ORDER — NICOTINE 14 MG/24HR TD PT24
14.0000 mg | MEDICATED_PATCH | Freq: Every day | TRANSDERMAL | Status: DC
Start: 2023-06-21 — End: 2023-06-22
  Administered 2023-06-21: 14 mg via TRANSDERMAL
  Filled 2023-06-21 (×2): qty 1

## 2023-06-21 MED ORDER — BUDESONIDE 0.25 MG/2ML IN SUSP
0.2500 mg | Freq: Two times a day (BID) | RESPIRATORY_TRACT | Status: DC
Start: 2023-06-21 — End: 2023-06-24
  Administered 2023-06-22 – 2023-06-24 (×5): 0.25 mg via RESPIRATORY_TRACT
  Filled 2023-06-21 (×5): qty 2

## 2023-06-21 MED ORDER — ARFORMOTEROL TARTRATE 15 MCG/2ML IN NEBU
15.0000 ug | INHALATION_SOLUTION | Freq: Two times a day (BID) | RESPIRATORY_TRACT | Status: DC
  Administered 2023-06-22 – 2023-06-24 (×5): 15 ug via RESPIRATORY_TRACT
  Filled 2023-06-21 (×5): qty 2

## 2023-06-21 MED ORDER — FUROSEMIDE 40 MG PO TABS
40.0000 mg | ORAL_TABLET | Freq: Every day | ORAL | Status: DC
Start: 2023-06-22 — End: 2023-06-24
  Administered 2023-06-22 – 2023-06-24 (×3): 40 mg via ORAL
  Filled 2023-06-21 (×3): qty 1

## 2023-06-21 MED ORDER — PANTOPRAZOLE SODIUM 40 MG PO TBEC
40.0000 mg | DELAYED_RELEASE_TABLET | Freq: Two times a day (BID) | ORAL | Status: DC
  Administered 2023-06-21 – 2023-06-24 (×6): 40 mg via ORAL
  Filled 2023-06-21 (×6): qty 1

## 2023-06-21 MED ORDER — NICOTINE POLACRILEX 2 MG MT GUM
2.0000 mg | CHEWING_GUM | OROMUCOSAL | Status: DC | PRN
  Administered 2023-06-22 – 2023-06-24 (×2): 2 mg via ORAL
  Filled 2023-06-21 (×3): qty 1

## 2023-06-21 NOTE — TOC Initial Note (Signed)
 Transition of Care [facility]) - Initial/Assessment Note    Patient Details  Name: Joseph Harvey MRN: [id number] Date of Birth: 25-May-1955  [facility] of [facility]) CM/SW Contact:    Bascom Service, RN Phone Number: 06/21/2023, 12:58 PM  Clinical Narrative:Home alone. Currently on 02-will monitor.                   Expected Discharge Plan: Home/Self Care Barriers to Discharge: Continued Medical Work up   Patient Goals and CMS Choice   CMS Medicare.gov Compare Post Acute Care list provided to:: Patient Choice offered to / list presented to : Patient [place] ownership interest in [facility].provided to:: Patient    Expected Discharge Plan and Services                                              Prior Living Arrangements/Services                       Activities of Daily Living   ADL Screening (condition at time of admission) Independently performs ADLs?: Yes (appropriate for developmental age) Is the patient deaf or have difficulty hearing?: No Does the patient have difficulty seeing, even when wearing glasses/contacts?: No Does the patient have difficulty concentrating, remembering, or making decisions?: No  Permission Sought/Granted                  Emotional Assessment              Admission diagnosis:  COPD exacerbation (HCC) [J44.1] Acute respiratory failure with hypoxia (HCC) [J96.01] Patient Active Problem List   Diagnosis Date Noted   COPD exacerbation (HCC) 06/20/2023   Hypertrophic cardiomyopathy (HCC) 07/04/2022   Aortic atherosclerosis (HCC) 07/04/2022   PVC (premature ventricular contraction) 08/10/2021   Pain due to onychomycosis of toenails of both feet 08/21/2020   Panic disorder 11/15/2019   Lipomatosis 07/09/2019   Low back pain 09/25/2018   Lumbar radiculopathy 09/25/2018   Constipation 09/17/2018   History of prostate cancer 09/17/2018   Hyperlipidemia associated with type 2 diabetes mellitus (HCC)  09/17/2018   History of paranasal sinus congestion 07/20/2018   Tobacco dependence 07/20/2018   Controlled type 2 diabetes mellitus without complication, without long-term current use of insulin  (HCC) 07/20/2018   Vitamin D deficiency 08/18/2016   Hyperlipidemia LDL goal <100 08/10/2016   Renal insufficiency 07/11/2016   Family history of colon cancer 09/30/2015   GERD (gastroesophageal reflux disease) 09/30/2015   History of colon polyps 09/30/2015   Obesity, morbid (HCC) 03/07/2015   Allergic rhinitis 12/15/2014   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 [place] [address] Phone: [phone] Fax: [phone]  [facility] [address] [place] [address] Phone: [phone] Fax: [phone]  [id number] - [place], [place] - [address] [facility] [address] [place] [address] Phone: [phone] Fax: [phone]  [facility] [address], [place] - [address] [place] [address] Phone: [phone] Fax: [phone]  [facility] - [place], [place] - [address] [place] [address] Phone: [phone] Fax: [phone]  [facility] [address] [place] [address] Phone: [phone] Fax: [phone]     Social Drivers of Health (SDOH) Social History: SDOH Screenings   Food Insecurity: No Food Insecurity (06/20/2023)  Housing: Low Risk  (06/20/2023)  Transportation Needs: No Transportation Needs (06/20/2023)  Utilities: Not At Risk (06/20/2023)  Alcohol Screen: Low  Risk  (02/16/2023)  Depression (PHQ2-9): Low Risk  (02/16/2023)  Social Connections: Unknown (06/20/2023)  Stress: No Stress Concern Present (05/26/2020)  Tobacco Use: High Risk (06/20/2023)   SDOH Interventions:     Readmission Risk Interventions     No data to display

## 2023-06-21 NOTE — Progress Notes (Signed)
 PROGRESS NOTE    Joseph Harvey  FMW:[id number] DOB: [birth year] DOA: 06/20/2023 PCP: Arloa Jarvis, NP   Brief Narrative: 68 year old with past medical history significant for COPD/asthma, obesity, non-insulin -dependent type 2 diabetes, GERD, hypertension, A-fib status post ablation presents with acute COPD exacerbation.  He presented with worsening dyspnea cough for the last 3 to 4 days.  He was seen in the ED and received IV Solu-Medrol , Lasix  and breathing treatment he refused admission at that time.  Overnight he wake up with severe shortness of breath and presented to the ER for further evaluation.  Again admitted with COPD found to have influenza A positive.   Assessment & Plan:   Principal Problem:   COPD exacerbation (HCC)  1-Acute COPD exacerbation in the setting of influenza A: Present with worsening cough, dyspnea, increased sputum production.  Chest x-ray no active disease.  BNP normal -Continue with IV Solu-Medrol  -Continue with DuoNeb -Start guaifenesin , he said that he has been able to tolerate guaifenesin  -Continue with PPI changed to twice daily -Start Pulmicort  and Brovana  Lasix   as needed -Continue with Tamiflu   2-Leukocytosis;  In setting steroids.   3-GERD;  Change PPI BID.   Diabetes Type 2: A1c order Hold metformin  Continue sliding scale insulin   Hypertension History A-fib -Continue with metoprolol  and amlodipine   Anxiety: Continue with BuSpar   Neuropathy: Continue with gabapentin   OSA: Continue with CPAP at at bedtime       Estimated body mass index is 35.29 kg/m as calculated from the following:   Height as of 06/02/23: 5' 9 (1.753 m).   Weight as of 06/19/23: 108.4 kg.   DVT prophylaxis: Lovenox  Code Status: Full code Family Communication: Care discussed with patient Disposition Plan:  Status is: Inpatient Remains inpatient appropriate because: Management of COPD    Consultants:  None  Procedures:  none  Antimicrobials:     Subjective: He is still complaining of shortness of breath, productive cough. He received a course of doxycycline  as started on 1 9/13 by his pulmonologist  Objective: Vitals:   06/21/23 0337 06/21/23 0849 06/21/23 1158 06/21/23 1537  BP: 136/78     Pulse: (!) 102     Resp: (!) 30  20   Temp: 98.4 F (36.9 C)     TempSrc: Oral     SpO2: 96% 97% 99% 99%    Intake/Output Summary (Last 24 hours) at 06/21/2023 1716 Last data filed at 06/21/2023 0500 Gross per 24 hour  Intake 1080 ml  Output --  Net 1080 ml   There were no vitals filed for this visit.  Examination:  General exam: Appears calm and comfortable  Respiratory system: Mild tachypnea, bilateral rhonchorous and wheezing Cardiovascular system: S1 & S2 heard, RRR. No JVD, murmurs, rubs, gallops or clicks. No pedal edema. Gastrointestinal system: Abdomen is nondistended, soft and nontender. No organomegaly or masses felt. Normal bowel sounds heard. Central nervous system: Alert and oriented. No focal neurological deficits. Extremities: Symmetric 5 x 5 power.    Data Reviewed: I have personally reviewed following labs and imaging studies  CBC: Recent Labs  Lab 06/19/23 1412 06/20/23 0651 06/21/23 0433  WBC 7.5 11.7* 11.5*  NEUTROABS  --  10.5*  --   HGB 13.5 14.8 14.2  HCT 38.7* 44.6 42.8  MCV 91.9 94.9 96.2  PLT 307 402* 366   Basic Metabolic Panel: Recent Labs  Lab 06/19/23 1412 06/20/23 0651 06/21/23 0433  NA 140 139 136  K 3.5 3.7 4.0  CL 104 101 99  CO2 25 22 25   GLUCOSE 159* 223* 222*  BUN 11 19 41*  CREATININE 0.98 1.24 1.49*  CALCIUM  9.3 9.7 9.3   GFR: Estimated Creatinine Clearance: 59.2 mL/min (A) (by C-G formula based on SCr of 1.49 mg/dL (H)). Liver Function Tests: No results for input(s): AST, ALT, ALKPHOS, BILITOT, PROT, ALBUMIN in the last 168 hours. No results for input(s): LIPASE, AMYLASE in the last 168 hours. No results for input(s): AMMONIA in the last 168  hours. Coagulation Profile: No results for input(s): INR, PROTIME in the last 168 hours. Cardiac Enzymes: No results for input(s): CKTOTAL, CKMB, CKMBINDEX, TROPONINI in the last 168 hours. BNP (last 3 results) No results for input(s): PROBNP in the last 8760 hours. HbA1C: Recent Labs    06/20/23 1048  HGBA1C 7.3*   CBG: Recent Labs  Lab 06/20/23 1622 06/20/23 2057 06/21/23 0816 06/21/23 1141 06/21/23 1655  GLUCAP 181* 258* 190* 233* 220*   Lipid Profile: No results for input(s): CHOL, HDL, LDLCALC, TRIG, CHOLHDL, LDLDIRECT in the last 72 hours. Thyroid Function Tests: No results for input(s): TSH, T4TOTAL, FREET4, T3FREE, THYROIDAB in the last 72 hours. Anemia Panel: No results for input(s): VITAMINB12, FOLATE, FERRITIN, TIBC, IRON, RETICCTPCT in the last 72 hours. Sepsis Labs: No results for input(s): PROCALCITON, LATICACIDVEN in the last 168 hours.  Recent Results (from the past 240 hours)  Resp panel by RT-PCR (RSV, Flu A&B, Covid) Anterior Nasal Swab     Status: Abnormal   Collection Time: 06/20/23  9:20 AM   Specimen: Anterior Nasal Swab  Result Value Ref Range Status   SARS Coronavirus 2 by RT PCR NEGATIVE NEGATIVE Final    Comment: (NOTE) SARS-CoV-2 target nucleic acids are NOT DETECTED.  The SARS-CoV-2 RNA is generally detectable in upper respiratory specimens during the acute phase of infection. The lowest concentration of SARS-CoV-2 viral copies this assay can detect is 138 copies/mL. A negative result does not preclude SARS-Cov-2 infection and should not be used as the sole basis for treatment or other patient management decisions. A negative result may occur with  improper specimen collection/handling, submission of specimen other than nasopharyngeal swab, presence of viral mutation(s) within the areas targeted by this assay, and inadequate number of viral copies(<138 copies/mL). A negative result must  be combined with clinical observations, patient history, and epidemiological information. The expected result is Negative.  Fact Sheet for Patients:  bloggercourse.com  Fact Sheet for Healthcare Providers:  seriousbroker.it  This test is no t yet approved or cleared by the [place]  FDA and  has been authorized for detection and/or diagnosis of SARS-CoV-2 by FDA under an Emergency Use Authorization (EUA). This EUA will remain  in effect (meaning this test can be used) for the duration of the COVID-19 declaration under Section 564(b)(1) of the Act, 21 U.S.C.section 360bbb-3(b)(1), unless the authorization is terminated  or revoked sooner.       Influenza A by PCR POSITIVE (A) NEGATIVE Final   Influenza B by PCR NEGATIVE NEGATIVE Final    Comment: (NOTE) The Xpert Xpress SARS-CoV-2/FLU/RSV plus assay is intended as an aid in the diagnosis of influenza from Nasopharyngeal swab specimens and should not be used as a sole basis for treatment. Nasal washings and aspirates are unacceptable for Xpert Xpress SARS-CoV-2/FLU/RSV testing.  Fact Sheet for Patients: bloggercourse.com  Fact Sheet for Healthcare Providers: seriousbroker.it  This test is not yet approved or cleared by the [place]  FDA and has been authorized for detection and/or diagnosis of SARS-CoV-2 by  FDA under an Emergency Use Authorization (EUA). This EUA will remain in effect (meaning this test can be used) for the duration of the COVID-19 declaration under Section 564(b)(1) of the Act, 21 U.S.C. section 360bbb-3(b)(1), unless the authorization is terminated or revoked.     Resp Syncytial Virus by PCR NEGATIVE NEGATIVE Final    Comment: (NOTE) Fact Sheet for Patients: bloggercourse.com  Fact Sheet for Healthcare Providers: seriousbroker.it  This test is  not yet approved or cleared by the [place]  FDA and has been authorized for detection and/or diagnosis of SARS-CoV-2 by FDA under an Emergency Use Authorization (EUA). This EUA will remain in effect (meaning this test can be used) for the duration of the COVID-19 declaration under Section 564(b)(1) of the Act, 21 U.S.C. section 360bbb-3(b)(1), unless the authorization is terminated or revoked.  Performed at [facility], [address] [place], [place] [address]          Radiology Studies: DG Chest 2 View Result Date: 06/20/2023 CLINICAL DATA:  shortness of breath.  Asthma EXAM: CHEST - 2 VIEW COMPARISON:  Chest x-ray 06/19/2023, PET CT 02/10/2023, chest x-ray 09/23/2022 FINDINGS: The heart and mediastinal contours are within normal limits. No focal consolidation. No pulmonary edema. No pleural effusion. No pneumothorax. No acute osseous abnormality. IMPRESSION: No active cardiopulmonary disease. Electronically Signed   By: Morgane  Naveau M.D.   On: 06/20/2023 07:56        Scheduled Meds:  amLODipine   5 mg Oral Daily   arformoterol   15 mcg Nebulization BID   aspirin  EC  81 mg Oral Daily   budesonide  (PULMICORT ) nebulizer solution  0.25 mg Nebulization BID   dextromethorphan -guaiFENesin   1 tablet Oral BID   enoxaparin  (LOVENOX ) injection  40 mg Subcutaneous Q24H   insulin  aspart  0-15 Units Subcutaneous TID WC   insulin  aspart  0-5 Units Subcutaneous QHS   ipratropium-albuterol   3 mL Nebulization QID   methylPREDNISolone  (SOLU-MEDROL ) injection  40 mg Intravenous Q12H   metoprolol  succinate  150 mg Oral Daily   montelukast   10 mg Oral QHS   nicotine   14 mg Transdermal Daily   oseltamivir   75 mg Oral BID   pantoprazole   40 mg Oral BID   Continuous Infusions:   LOS: 0 days    Time spent: 35 minutes    Christipher Rieger A Nikalas Bramel, MD [facility]   If 7PM-7AM, please contact night-coverage [email or web address]  06/21/2023, 5:16 PM

## 2023-06-21 NOTE — Plan of Care (Signed)

## 2023-06-21 NOTE — Care Management Obs Status (Signed)
 MEDICARE OBSERVATION STATUS NOTIFICATION   Patient Details  Name: Joseph Harvey MRN: [id number] Date of Birth: 1956-02-20   Medicare Observation Status Notification Given:  Yes    MahabirThersia Flax, RN 06/21/2023, 12:28 PM

## 2023-06-21 NOTE — Inpatient Diabetes Management (Signed)
 Inpatient Diabetes Program Recommendations  AACE/ADA: New Consensus Statement on Inpatient Glycemic Control   Target Ranges:  Prepandial:   less than 140 mg/dL      Peak postprandial:   less than 180 mg/dL (1-2 hours)      Critically ill patients:  140 - 180 mg/dL    Latest Reference Range & Units 06/20/23 12:37 06/20/23 16:22 06/20/23 20:57 06/21/23 08:16  Glucose-Capillary 70 - 99 mg/dL 793 (H) 818 (H) 741 (H) 190 (H)    Latest Reference Range & Units 06/20/23 10:48  Hemoglobin A1C 4.8 - 5.6 % 7.3 (H)   Review of Glycemic Control  Diabetes history: DM2 Outpatient Diabetes medications: Metformin  XR 1000 QAM, 500 mg QPM, Rybelsus 7 mg daily (not taking) Current orders for Inpatient glycemic control: Novolog  0-15 units TID with meals, Novolog  0-5 units at bedtime; Solumedrol 40 mg Q12H  Inpatient Diabetes Program Recommendations:    Insulin : If steroids are continued, please consider ordering Novolog  4 units TID with meals for meal coverage if patient eats at least 50% of meals.   Thanks, Earnie Gainer, RN, MSN, CDCES Diabetes Coordinator Inpatient Diabetes Program [phone] (Team Pager from 8am to 5pm)

## 2023-06-21 NOTE — Plan of Care (Signed)

## 2023-06-21 NOTE — Telephone Encounter (Signed)
 Patient would also like for Dr.Hunsucker to visit him. He is at the [facility].

## 2023-06-21 NOTE — Telephone Encounter (Signed)
 Patient is still in the ED and would like to be put on oxygen once he gets out.

## 2023-06-22 ENCOUNTER — Encounter: Payer: Self-pay | Admitting: Pulmonary Disease

## 2023-06-22 DIAGNOSIS — J441 Chronic obstructive pulmonary disease with (acute) exacerbation: Secondary | ICD-10-CM | POA: Diagnosis not present

## 2023-06-22 LAB — GLUCOSE, CAPILLARY
Glucose-Capillary: 183 mg/dL — ABNORMAL HIGH (ref 70–99)
Glucose-Capillary: 210 mg/dL — ABNORMAL HIGH (ref 70–99)
Glucose-Capillary: 209 mg/dL — ABNORMAL HIGH (ref 70–99)
Glucose-Capillary: 251 mg/dL — ABNORMAL HIGH (ref 70–99)
Glucose-Capillary: 225 mg/dL — ABNORMAL HIGH (ref 70–99)

## 2023-06-22 LAB — BASIC METABOLIC PANEL
Anion gap: 12 (ref 5–15)
BUN: 40 mg/dL — ABNORMAL HIGH (ref 8–23)
CO2: 24 mmol/L (ref 22–32)
Calcium: 9.1 mg/dL (ref 8.9–10.3)
Chloride: 102 mmol/L (ref 98–111)
Creatinine, Ser: 1.32 mg/dL — ABNORMAL HIGH (ref 0.61–1.24)
GFR, Estimated: 59 mL/min — ABNORMAL LOW (ref >60)
Glucose, Bld: 286 mg/dL — ABNORMAL HIGH (ref 70–99)
Potassium: 4.2 mmol/L (ref 3.5–5.1)
Sodium: 138 mmol/L (ref 135–145)

## 2023-06-22 MED ORDER — NICOTINE 7 MG/24HR TD PT24
7.0000 mg | MEDICATED_PATCH | Freq: Every day | TRANSDERMAL | Status: DC
  Administered 2023-06-22 – 2023-06-24 (×3): 7 mg via TRANSDERMAL
  Filled 2023-06-22 (×3): qty 1

## 2023-06-22 MED ORDER — LEVOFLOXACIN 500 MG PO TABS
500.0000 mg | ORAL_TABLET | Freq: Every day | ORAL | Status: DC
  Administered 2023-06-22 – 2023-06-24 (×3): 500 mg via ORAL
  Filled 2023-06-22 (×4): qty 1

## 2023-06-22 NOTE — Telephone Encounter (Signed)
 NFN

## 2023-06-22 NOTE — Plan of Care (Signed)
 Reviewing plan of care.  Problem: Clinical Measurements: COPD with acute exacerbation and positive Flu A, with underlying of asthma, obesity, insulin  dependent type 2 diabetes, GERD, HTN, A-fib, s/p ablation. Goal: Ability to maintain clinical measurements within normal limits will improve Outcome: Progressing Goal: Will remain free from infection Outcome: Progressing- afebrile.  Goal: Diagnostic test results will improve Outcome: Progressing Goal: Respiratory complications will improve Outcome: Progressing- on 2 LPM of O2 NCL. He is able to tolerate ambulation in room well. No acute respiratory distress. He has refused CPAP tonight. He gets routine breathing treatment and steroid.  Goal: Cardiovascular complication will be avoided- Hx of A-fib.  Outcome: Progressing- vital signs stable. No acute distress overnight.    Problem: Activity: limit due to disease related, SOB with exertion. Goal: Risk for activity intolerance will decrease Outcome: Progressing- able to ambulate independently in his room. He is able to tolerate well.   Problem: Pain Managment: Goal: General experience of comfort will improve and/or be controlled Outcome: Progressing- no  complaints of pain.    Wendi Dash, RN

## 2023-06-22 NOTE — Telephone Encounter (Signed)
 Called and spoke to patient.  He stated that he is currently admitted and he will be discharged with oxygen. He is wanting portable oxygen tanks. Advised patient that an oxygen order should be sent to a DME company upon discharge and the DME company will provide him with portable tanks.  He voiced his understanding.  Nothing further needed.

## 2023-06-22 NOTE — Progress Notes (Signed)
   06/22/23 2039  BiPAP/CPAP/SIPAP  BiPAP/CPAP/SIPAP Pt Type Adult  Reason BIPAP/CPAP not in use Non-compliant (Pt refusing for the night)

## 2023-06-22 NOTE — Progress Notes (Signed)
   06/22/23 0005  BiPAP/CPAP/SIPAP  BiPAP/CPAP/SIPAP Pt Type Adult  Reason BIPAP/CPAP not in use Other(comment) (refuses for tonight.)

## 2023-06-22 NOTE — Progress Notes (Signed)
 Pt has kept albuterol  for his secured inhaler and nicotine  gum from home at bedside. He got irritable and  refused to keep those meds at the pharmacy department. After discussing about home med management per hospital policy, Pt agreed to report to RN if he uses any of these meds by himself. We will hand off to the day shift team.   Wendi Dash, RN

## 2023-06-22 NOTE — Progress Notes (Signed)
 PROGRESS NOTE    Joseph Harvey  FMW:[id number] DOB: 05-29-1955 DOA: 06/20/2023 PCP: Arloa Jarvis, NP   Brief Narrative: 68 year old with past medical history significant for COPD/asthma, obesity, non-insulin -dependent type 2 diabetes, GERD, hypertension, A-fib status post ablation presents with acute COPD exacerbation.  He presented with worsening dyspnea cough for the last 3 to 4 days.  He was seen in the ED and received IV Solu-Medrol , Lasix  and breathing treatment he refused admission at that time.  Overnight he wake up with severe shortness of breath and presented to the ER for further evaluation.  Again admitted with COPD found to have influenza A positive.   Assessment & Plan:   Principal Problem:   COPD exacerbation (HCC)  1-Acute COPD exacerbation in the setting of influenza A: Acute hypoxic respiratory failure. Requiring 3 L oxygen.  Present with worsening cough, dyspnea, increased sputum production.  Chest x-ray no active disease.  BNP normal -Continue with IV Solu-Medrol  -Continue with DuoNeb -Started  guaifenesin , tolerating., will remove from allergy  -Continue with PPI changed to twice daily -Continue with  Pulmicort  and Brovana  Lasix  resume -Continue with Tamiflu  Will add levaquin  for 5 days.  Will do home oxygen evaluation.   2-Leukocytosis;  In setting steroids.   3-GERD;  On PPI BID.   Diabetes Type 2: A1c order Hold metformin  Continue sliding scale insulin   Hypertension History A-fib -Continue with metoprolol  and amlodipine   Anxiety: Continue with BuSpar   Neuropathy: Continue with gabapentin   OSA: Continue with CPAP at at bedtime       Estimated body mass index is 32.12 kg/m as calculated from the following:   Height as of 06/02/23: 5' 9 (1.753 m).   Weight as of this encounter: 98.7 kg.   DVT prophylaxis: Lovenox  Code Status: Full code Family Communication: Care discussed with patient Disposition Plan:  Status is: Inpatient Remains  inpatient appropriate because: Management of COPD    Consultants:  None  Procedures:  none  Antimicrobials:    Subjective: Still coughing phlegm. Purulent.  Still having SOB, not at baseline.  We discussed importance of stopping smoking.    Objective: Vitals:   06/22/23 0456 06/22/23 0500 06/22/23 0830 06/22/23 1305  BP: 139/83   (!) 139/91  Pulse: 91   82  Resp: (!) 21   18  Temp: 97.8 F (36.6 C)   98 F (36.7 C)  TempSrc: Oral   Oral  SpO2: 99%  97% 100%  Weight:  98.7 kg      Intake/Output Summary (Last 24 hours) at 06/22/2023 1423 Last data filed at 06/22/2023 0800 Gross per 24 hour  Intake 490 ml  Output 1275 ml  Net -785 ml   Filed Weights   06/22/23 0500  Weight: 98.7 kg    Examination:  General exam: NAD Respiratory system: Tachypnea, BL ronchus Cardiovascular system: S 1, S 3 RRR Gastrointestinal system: BS present, soft, nt Central nervous system: alert Extremities: Symmetric 5 x 5 power.    Data Reviewed: I have personally reviewed following labs and imaging studies  CBC: Recent Labs  Lab 06/19/23 1412 06/20/23 0651 06/21/23 0433  WBC 7.5 11.7* 11.5*  NEUTROABS  --  10.5*  --   HGB 13.5 14.8 14.2  HCT 38.7* 44.6 42.8  MCV 91.9 94.9 96.2  PLT 307 402* 366   Basic Metabolic Panel: Recent Labs  Lab 06/19/23 1412 06/20/23 0651 06/21/23 0433 06/22/23 0950  NA 140 139 136 138  K 3.5 3.7 4.0 4.2  CL 104 101 99 102  CO2 25 22 25 24   GLUCOSE 159* 223* 222* 286*  BUN 11 19 41* 40*  CREATININE 0.98 1.24 1.49* 1.32*  CALCIUM  9.3 9.7 9.3 9.1   GFR: Estimated Creatinine Clearance: 63.8 mL/min (A) (by C-G formula based on SCr of 1.32 mg/dL (H)). Liver Function Tests: No results for input(s): AST, ALT, ALKPHOS, BILITOT, PROT, ALBUMIN in the last 168 hours. No results for input(s): LIPASE, AMYLASE in the last 168 hours. No results for input(s): AMMONIA in the last 168 hours. Coagulation Profile: No results for  input(s): INR, PROTIME in the last 168 hours. Cardiac Enzymes: No results for input(s): CKTOTAL, CKMB, CKMBINDEX, TROPONINI in the last 168 hours. BNP (last 3 results) No results for input(s): PROBNP in the last 8760 hours. HbA1C: Recent Labs    06/20/23 1048  HGBA1C 7.3*   CBG: Recent Labs  Lab 06/21/23 1655 06/21/23 2140 06/22/23 0750 06/22/23 1307 06/22/23 1419  GLUCAP 220* 173* 183* 210* 209*   Lipid Profile: No results for input(s): CHOL, HDL, LDLCALC, TRIG, CHOLHDL, LDLDIRECT in the last 72 hours. Thyroid Function Tests: No results for input(s): TSH, T4TOTAL, FREET4, T3FREE, THYROIDAB in the last 72 hours. Anemia Panel: No results for input(s): VITAMINB12, FOLATE, FERRITIN, TIBC, IRON, RETICCTPCT in the last 72 hours. Sepsis Labs: No results for input(s): PROCALCITON, LATICACIDVEN in the last 168 hours.  Recent Results (from the past 240 hours)  Resp panel by RT-PCR (RSV, Flu A&B, Covid) Anterior Nasal Swab     Status: Abnormal   Collection Time: 06/20/23  9:20 AM   Specimen: Anterior Nasal Swab  Result Value Ref Range Status   SARS Coronavirus 2 by RT PCR NEGATIVE NEGATIVE Final    Comment: (NOTE) SARS-CoV-2 target nucleic acids are NOT DETECTED.  The SARS-CoV-2 RNA is generally detectable in upper respiratory specimens during the acute phase of infection. The lowest concentration of SARS-CoV-2 viral copies this assay can detect is 138 copies/mL. A negative result does not preclude SARS-Cov-2 infection and should not be used as the sole basis for treatment or other patient management decisions. A negative result may occur with  improper specimen collection/handling, submission of specimen other than nasopharyngeal swab, presence of viral mutation(s) within the areas targeted by this assay, and inadequate number of viral copies(<138 copies/mL). A negative result must be combined with clinical observations,  patient history, and epidemiological information. The expected result is Negative.  Fact Sheet for Patients:  bloggercourse.com  Fact Sheet for Healthcare Providers:  seriousbroker.it  This test is no t yet approved or cleared by the [place]  FDA and  has been authorized for detection and/or diagnosis of SARS-CoV-2 by FDA under an Emergency Use Authorization (EUA). This EUA will remain  in effect (meaning this test can be used) for the duration of the COVID-19 declaration under Section 564(b)(1) of the Act, 21 U.S.C.section 360bbb-3(b)(1), unless the authorization is terminated  or revoked sooner.       Influenza A by PCR POSITIVE (A) NEGATIVE Final   Influenza B by PCR NEGATIVE NEGATIVE Final    Comment: (NOTE) The Xpert Xpress SARS-CoV-2/FLU/RSV plus assay is intended as an aid in the diagnosis of influenza from Nasopharyngeal swab specimens and should not be used as a sole basis for treatment. Nasal washings and aspirates are unacceptable for Xpert Xpress SARS-CoV-2/FLU/RSV testing.  Fact Sheet for Patients: bloggercourse.com  Fact Sheet for Healthcare Providers: seriousbroker.it  This test is not yet approved or cleared by the [place]  FDA and has been authorized for detection  and/or diagnosis of SARS-CoV-2 by FDA under an Emergency Use Authorization (EUA). This EUA will remain in effect (meaning this test can be used) for the duration of the COVID-19 declaration under Section 564(b)(1) of the Act, 21 U.S.C. section 360bbb-3(b)(1), unless the authorization is terminated or revoked.     Resp Syncytial Virus by PCR NEGATIVE NEGATIVE Final    Comment: (NOTE) Fact Sheet for Patients: bloggercourse.com  Fact Sheet for Healthcare Providers: seriousbroker.it  This test is not yet approved or cleared by the [place]  [place] FDA and has been authorized for detection and/or diagnosis of SARS-CoV-2 by FDA under an Emergency Use Authorization (EUA). This EUA will remain in effect (meaning this test can be used) for the duration of the COVID-19 declaration under Section 564(b)(1) of the Act, 21 U.S.C. section 360bbb-3(b)(1), unless the authorization is terminated or revoked.  Performed at [facility], [address] [place], [place] [address]   Respiratory (~20 pathogens) panel by PCR     Status: Abnormal   Collection Time: 06/21/23  3:24 PM   Specimen: Nasopharyngeal Swab; Respiratory  Result Value Ref Range Status   Adenovirus NOT DETECTED NOT DETECTED Final   Coronavirus 229E NOT DETECTED NOT DETECTED Final    Comment: (NOTE) The Coronavirus on the Respiratory Panel, DOES NOT test for the novel  Coronavirus (2019 nCoV)    Coronavirus HKU1 NOT DETECTED NOT DETECTED Final   Coronavirus NL63 NOT DETECTED NOT DETECTED Final   Coronavirus OC43 NOT DETECTED NOT DETECTED Final   Metapneumovirus NOT DETECTED NOT DETECTED Final   Rhinovirus / Enterovirus NOT DETECTED NOT DETECTED Final   Influenza A H1 2009 DETECTED (A) NOT DETECTED Final   Influenza B NOT DETECTED NOT DETECTED Final   Parainfluenza Virus 1 NOT DETECTED NOT DETECTED Final   Parainfluenza Virus 2 NOT DETECTED NOT DETECTED Final   Parainfluenza Virus 3 NOT DETECTED NOT DETECTED Final   Parainfluenza Virus 4 NOT DETECTED NOT DETECTED Final   Respiratory Syncytial Virus NOT DETECTED NOT DETECTED Final   Bordetella pertussis NOT DETECTED NOT DETECTED Final   Bordetella Parapertussis NOT DETECTED NOT DETECTED Final   Chlamydophila pneumoniae NOT DETECTED NOT DETECTED Final   Mycoplasma pneumoniae NOT DETECTED NOT DETECTED Final    Comment: Performed at [facility], [address] [place], [place] [address]         Radiology Studies: No results found.       Scheduled Meds:  amLODipine   5 mg Oral  Daily   arformoterol   15 mcg Nebulization BID   aspirin  EC  81 mg Oral Daily   budesonide  (PULMICORT ) nebulizer solution  0.25 mg Nebulization BID   dextromethorphan -guaiFENesin   1 tablet Oral BID   enoxaparin  (LOVENOX ) injection  40 mg Subcutaneous Q24H   furosemide   40 mg Oral Daily   insulin  aspart  0-15 Units Subcutaneous TID WC   insulin  aspart  0-5 Units Subcutaneous QHS   ipratropium-albuterol   3 mL Nebulization QID   levofloxacin   500 mg Oral Daily   methylPREDNISolone  (SOLU-MEDROL ) injection  40 mg Intravenous Q12H   metoprolol  succinate  150 mg Oral Daily   montelukast   10 mg Oral QHS   nicotine   7 mg Transdermal Daily   oseltamivir   75 mg Oral BID   pantoprazole   40 mg Oral BID   Continuous Infusions:   LOS: 1 day    Time spent: 35 minutes    Wynona Duhamel A Claude Waldman, MD [facility]   If 7PM-7AM, please  contact night-coverage [email or web address]  06/22/2023, 2:23 PM

## 2023-06-22 NOTE — Telephone Encounter (Signed)
 Patient is in the hospital and was wondering if Dr.Hunsucker could order him a portable oxygen tank for when he comes home. Please call and advise [phone]. Room 1534

## 2023-06-23 DIAGNOSIS — J441 Chronic obstructive pulmonary disease with (acute) exacerbation: Secondary | ICD-10-CM | POA: Diagnosis not present

## 2023-06-23 LAB — GLUCOSE, CAPILLARY
Glucose-Capillary: 221 mg/dL — ABNORMAL HIGH (ref 70–99)
Glucose-Capillary: 192 mg/dL — ABNORMAL HIGH (ref 70–99)
Glucose-Capillary: 247 mg/dL — ABNORMAL HIGH (ref 70–99)
Glucose-Capillary: 261 mg/dL — ABNORMAL HIGH (ref 70–99)

## 2023-06-23 MED ORDER — SODIUM CHLORIDE 3 % IN NEBU
4.0000 mL | INHALATION_SOLUTION | Freq: Every day | RESPIRATORY_TRACT | Status: DC
  Administered 2023-06-23: 4 mL via RESPIRATORY_TRACT
  Filled 2023-06-23 (×2): qty 4

## 2023-06-23 NOTE — Telephone Encounter (Signed)
 Duplicate encounter please see previous telephone message

## 2023-06-23 NOTE — Progress Notes (Signed)
 PROGRESS NOTE    Joseph Harvey  FMW:[id number] DOB: 1956-04-02 DOA: 06/20/2023 PCP: Arloa Jarvis, NP   Brief Narrative: 68 year old with past medical history significant for COPD/asthma, obesity, non-insulin -dependent type 2 diabetes, GERD, hypertension, A-fib status post ablation presents with acute COPD exacerbation.  He presented with worsening dyspnea cough for the last 3 to 4 days.  He was seen in the ED and received IV Solu-Medrol , Lasix  and breathing treatment he refused admission at that time.  Overnight he wake up with severe shortness of breath and presented to the ER for further evaluation.  Again admitted with COPD found to have influenza A positive.   Assessment & Plan:   Principal Problem:   COPD exacerbation (HCC)  1-Acute COPD exacerbation in the setting of influenza A: Acute hypoxic respiratory failure. Requiring 3 L oxygen.  Present with worsening cough, dyspnea, increased sputum production.  Chest x-ray no active disease.  BNP normal -Continue with IV Solu-Medrol  -Continue with DuoNeb -Started  guaifenesin , tolerating., will remove from allergy  -Continue with PPI changed to twice daily -Continue with  Pulmicort  and Brovana  Lasix  resume -Continue with Tamiflu  Continue  levaquin  for 5 days.  Will do home oxygen evaluation.  Add hypertonic saline to help with secretions.   2-Leukocytosis;  In setting steroids.   3-GERD;  On PPI BID.   Diabetes Type 2: A1c order Hold metformin  Continue sliding scale insulin   Hypertension History A-fib -Continue with metoprolol  and amlodipine   Anxiety: Continue with BuSpar   Neuropathy: Continue with gabapentin   OSA: Continue with CPAP at at bedtime       Estimated body mass index is 32.1 kg/m as calculated from the following:   Height as of 06/02/23: 5' 9 (1.753 m).   Weight as of this encounter: 98.6 kg.   DVT prophylaxis: Lovenox  Code Status: Full code Family Communication: Care discussed with  patient Disposition Plan:  Status is: Inpatient Remains inpatient appropriate because: Management of COPD    Consultants:  None  Procedures:  none  Antimicrobials:    Subjective: Difficulty coughing phlegm up.  Breathing improving slowly   Objective: Vitals:   06/23/23 0745 06/23/23 0934 06/23/23 1117 06/23/23 1241  BP:  (!) 142/81  129/70  Pulse:  81  91  Resp:    19  Temp:    98 F (36.7 C)  TempSrc:    Oral  SpO2: 98%  96% 98%  Weight:        Intake/Output Summary (Last 24 hours) at 06/23/2023 1453 Last data filed at 06/23/2023 0900 Gross per 24 hour  Intake 240 ml  Output 325 ml  Net -85 ml   Filed Weights   06/22/23 0500 06/23/23 0740  Weight: 98.7 kg 98.6 kg    Examination:  General exam: NAD Respiratory system: BL air movement  Cardiovascular system: S 1, S 2 RRR Gastrointestinal system: BS present, soft, nt Central nervous system:= Alert Extremities: no edema    Data Reviewed: I have personally reviewed following labs and imaging studies  CBC: Recent Labs  Lab 06/19/23 1412 06/20/23 0651 06/21/23 0433  WBC 7.5 11.7* 11.5*  NEUTROABS  --  10.5*  --   HGB 13.5 14.8 14.2  HCT 38.7* 44.6 42.8  MCV 91.9 94.9 96.2  PLT 307 402* 366   Basic Metabolic Panel: Recent Labs  Lab 06/19/23 1412 06/20/23 0651 06/21/23 0433 06/22/23 0950  NA 140 139 136 138  K 3.5 3.7 4.0 4.2  CL 104 101 99 102  CO2 25 22 25  24  GLUCOSE 159* 223* 222* 286*  BUN 11 19 41* 40*  CREATININE 0.98 1.24 1.49* 1.32*  CALCIUM  9.3 9.7 9.3 9.1   GFR: Estimated Creatinine Clearance: 63.8 mL/min (A) (by C-G formula based on SCr of 1.32 mg/dL (H)). Liver Function Tests: No results for input(s): AST, ALT, ALKPHOS, BILITOT, PROT, ALBUMIN in the last 168 hours. No results for input(s): LIPASE, AMYLASE in the last 168 hours. No results for input(s): AMMONIA in the last 168 hours. Coagulation Profile: No results for input(s): INR, PROTIME in the  last 168 hours. Cardiac Enzymes: No results for input(s): CKTOTAL, CKMB, CKMBINDEX, TROPONINI in the last 168 hours. BNP (last 3 results) No results for input(s): PROBNP in the last 8760 hours. HbA1C: No results for input(s): HGBA1C in the last 72 hours.  CBG: Recent Labs  Lab 06/22/23 1419 06/22/23 1623 06/22/23 2032 06/23/23 0727 06/23/23 1141  GLUCAP 209* 251* 225* 221* 192*   Lipid Profile: No results for input(s): CHOL, HDL, LDLCALC, TRIG, CHOLHDL, LDLDIRECT in the last 72 hours. Thyroid Function Tests: No results for input(s): TSH, T4TOTAL, FREET4, T3FREE, THYROIDAB in the last 72 hours. Anemia Panel: No results for input(s): VITAMINB12, FOLATE, FERRITIN, TIBC, IRON, RETICCTPCT in the last 72 hours. Sepsis Labs: No results for input(s): PROCALCITON, LATICACIDVEN in the last 168 hours.  Recent Results (from the past 240 hours)  Resp panel by RT-PCR (RSV, Flu A&B, Covid) Anterior Nasal Swab     Status: Abnormal   Collection Time: 06/20/23  9:20 AM   Specimen: Anterior Nasal Swab  Result Value Ref Range Status   SARS Coronavirus 2 by RT PCR NEGATIVE NEGATIVE Final    Comment: (NOTE) SARS-CoV-2 target nucleic acids are NOT DETECTED.  The SARS-CoV-2 RNA is generally detectable in upper respiratory specimens during the acute phase of infection. The lowest concentration of SARS-CoV-2 viral copies this assay can detect is 138 copies/mL. A negative result does not preclude SARS-Cov-2 infection and should not be used as the sole basis for treatment or other patient management decisions. A negative result may occur with  improper specimen collection/handling, submission of specimen other than nasopharyngeal swab, presence of viral mutation(s) within the areas targeted by this assay, and inadequate number of viral copies(<138 copies/mL). A negative result must be combined with clinical observations, patient history, and  epidemiological information. The expected result is Negative.  Fact Sheet for Patients:  bloggercourse.com  Fact Sheet for Healthcare Providers:  seriousbroker.it  This test is no t yet approved or cleared by the [place]  FDA and  has been authorized for detection and/or diagnosis of SARS-CoV-2 by FDA under an Emergency Use Authorization (EUA). This EUA will remain  in effect (meaning this test can be used) for the duration of the COVID-19 declaration under Section 564(b)(1) of the Act, 21 U.S.C.section 360bbb-3(b)(1), unless the authorization is terminated  or revoked sooner.       Influenza A by PCR POSITIVE (A) NEGATIVE Final   Influenza B by PCR NEGATIVE NEGATIVE Final    Comment: (NOTE) The Xpert Xpress SARS-CoV-2/FLU/RSV plus assay is intended as an aid in the diagnosis of influenza from Nasopharyngeal swab specimens and should not be used as a sole basis for treatment. Nasal washings and aspirates are unacceptable for Xpert Xpress SARS-CoV-2/FLU/RSV testing.  Fact Sheet for Patients: bloggercourse.com  Fact Sheet for Healthcare Providers: seriousbroker.it  This test is not yet approved or cleared by the [place]  FDA and has been authorized for detection and/or diagnosis of SARS-CoV-2 by FDA under  an Emergency Use Authorization (EUA). This EUA will remain in effect (meaning this test can be used) for the duration of the COVID-19 declaration under Section 564(b)(1) of the Act, 21 U.S.C. section 360bbb-3(b)(1), unless the authorization is terminated or revoked.     Resp Syncytial Virus by PCR NEGATIVE NEGATIVE Final    Comment: (NOTE) Fact Sheet for Patients: bloggercourse.com  Fact Sheet for Healthcare Providers: seriousbroker.it  This test is not yet approved or cleared by the [place]  FDA and has  been authorized for detection and/or diagnosis of SARS-CoV-2 by FDA under an Emergency Use Authorization (EUA). This EUA will remain in effect (meaning this test can be used) for the duration of the COVID-19 declaration under Section 564(b)(1) of the Act, 21 U.S.C. section 360bbb-3(b)(1), unless the authorization is terminated or revoked.  Performed at [facility], [address] [place], [place] [address]   Respiratory (~20 pathogens) panel by PCR     Status: Abnormal   Collection Time: 06/21/23  3:24 PM   Specimen: Nasopharyngeal Swab; Respiratory  Result Value Ref Range Status   Adenovirus NOT DETECTED NOT DETECTED Final   Coronavirus 229E NOT DETECTED NOT DETECTED Final    Comment: (NOTE) The Coronavirus on the Respiratory Panel, DOES NOT test for the novel  Coronavirus (2019 nCoV)    Coronavirus HKU1 NOT DETECTED NOT DETECTED Final   Coronavirus NL63 NOT DETECTED NOT DETECTED Final   Coronavirus OC43 NOT DETECTED NOT DETECTED Final   Metapneumovirus NOT DETECTED NOT DETECTED Final   Rhinovirus / Enterovirus NOT DETECTED NOT DETECTED Final   Influenza A H1 2009 DETECTED (A) NOT DETECTED Final   Influenza B NOT DETECTED NOT DETECTED Final   Parainfluenza Virus 1 NOT DETECTED NOT DETECTED Final   Parainfluenza Virus 2 NOT DETECTED NOT DETECTED Final   Parainfluenza Virus 3 NOT DETECTED NOT DETECTED Final   Parainfluenza Virus 4 NOT DETECTED NOT DETECTED Final   Respiratory Syncytial Virus NOT DETECTED NOT DETECTED Final   Bordetella pertussis NOT DETECTED NOT DETECTED Final   Bordetella Parapertussis NOT DETECTED NOT DETECTED Final   Chlamydophila pneumoniae NOT DETECTED NOT DETECTED Final   Mycoplasma pneumoniae NOT DETECTED NOT DETECTED Final    Comment: Performed at [facility], [address], [place], [place] [address]         Radiology Studies: No results found.       Scheduled Meds:  amLODipine   5 mg Oral Daily   arformoterol    15 mcg Nebulization BID   aspirin  EC  81 mg Oral Daily   budesonide  (PULMICORT ) nebulizer solution  0.25 mg Nebulization BID   dextromethorphan -guaiFENesin   1 tablet Oral BID   enoxaparin  (LOVENOX ) injection  40 mg Subcutaneous Q24H   furosemide   40 mg Oral Daily   insulin  aspart  0-15 Units Subcutaneous TID WC   insulin  aspart  0-5 Units Subcutaneous QHS   ipratropium-albuterol   3 mL Nebulization QID   levofloxacin   500 mg Oral Daily   methylPREDNISolone  (SOLU-MEDROL ) injection  40 mg Intravenous Q12H   metoprolol  succinate  150 mg Oral Daily   montelukast   10 mg Oral QHS   nicotine   7 mg Transdermal Daily   oseltamivir   75 mg Oral BID   pantoprazole   40 mg Oral BID   sodium chloride  HYPERTONIC  4 mL Nebulization Daily   Continuous Infusions:   LOS: 2 days    Time spent: 35 minutes    Owen DELENA Lore, MD [facility]  If 7PM-7AM, please contact night-coverage [email or web address]  06/23/2023, 2:53 PM

## 2023-06-23 NOTE — Plan of Care (Signed)

## 2023-06-23 NOTE — Progress Notes (Signed)
 Walking test preformed on PT.O2 sat in room siting without o2 89-90%.pulse 102.Walking without o2 89-91%.Walking with o2 96-98%.Sitting with o2 97%.

## 2023-06-24 ENCOUNTER — Other Ambulatory Visit (HOSPITAL_COMMUNITY): Payer: Self-pay

## 2023-06-24 DIAGNOSIS — J441 Chronic obstructive pulmonary disease with (acute) exacerbation: Secondary | ICD-10-CM | POA: Diagnosis not present

## 2023-06-24 LAB — GLUCOSE, CAPILLARY: Glucose-Capillary: 252 mg/dL — ABNORMAL HIGH (ref 70–99)

## 2023-06-24 MED ORDER — IPRATROPIUM BROMIDE 0.06 % NA SOLN
2.0000 | Freq: Three times a day (TID) | NASAL | 11 refills | Status: AC
  Filled 2023-06-24: qty 15, 25d supply, fill #0

## 2023-06-24 MED ORDER — LEVOFLOXACIN 500 MG PO TABS
500.0000 mg | ORAL_TABLET | Freq: Every day | ORAL | 0 refills | Status: AC
  Filled 2023-06-24: qty 2, 2d supply, fill #0

## 2023-06-24 MED ORDER — DM-GUAIFENESIN ER 30-600 MG PO TB12
1.0000 | ORAL_TABLET | Freq: Two times a day (BID) | ORAL | 0 refills | Status: AC
  Filled 2023-06-24: qty 20, 10d supply, fill #0

## 2023-06-24 MED ORDER — NICOTINE 7 MG/24HR TD PT24
7.0000 mg | MEDICATED_PATCH | Freq: Every day | TRANSDERMAL | 0 refills | Status: DC
  Filled 2023-06-24: qty 28, 28d supply, fill #0

## 2023-06-24 MED ORDER — IPRATROPIUM-ALBUTEROL 0.5-2.5 (3) MG/3ML IN SOLN
RESPIRATORY_TRACT | 3 refills | Status: AC
  Filled 2023-06-24: qty 90, 30d supply, fill #0

## 2023-06-24 MED ORDER — PANTOPRAZOLE SODIUM 40 MG PO TBEC
40.0000 mg | DELAYED_RELEASE_TABLET | Freq: Two times a day (BID) | ORAL | 2 refills | Status: DC
  Filled 2023-06-24: qty 60, 30d supply, fill #0

## 2023-06-24 MED ORDER — IPRATROPIUM-ALBUTEROL 0.5-2.5 (3) MG/3ML IN SOLN
3.0000 mL | Freq: Two times a day (BID) | RESPIRATORY_TRACT | Status: DC
Start: 2023-06-24 — End: 2023-06-24

## 2023-06-24 MED ORDER — PREDNISONE 10 MG PO TABS
40.0000 mg | ORAL_TABLET | Freq: Every day | ORAL | 0 refills | Status: DC
  Filled 2023-06-24: qty 20, 5d supply, fill #0

## 2023-06-24 NOTE — Plan of Care (Signed)

## 2023-06-24 NOTE — Progress Notes (Signed)
SATURATION QUALIFICATIONS: (This note is used to comply with regulatory documentation for home oxygen)  Patient Saturations on Room Air at Rest = 97%  Patient Saturations on Room Air while Ambulating = 95%  Patient Saturations on 2 Liters of oxygen while Ambulating = 99%  Please briefly explain why patient needs home oxygen:

## 2023-06-24 NOTE — Progress Notes (Addendum)
SATURATION QUALIFICATIONS: (This note is used to comply with regulatory documentation for home oxygen)  Patient Saturations on Room Air at Rest = 97%  Patient Saturations on Room Air while Ambulating = 95%  Patient Saturations on 2 Liters of oxygen while Ambulating = 99%  Please briefly explain why patient needs home oxygen:

## 2023-06-24 NOTE — Discharge Summary (Signed)
 Physician Discharge Summary   Patient: Joseph Harvey MRN: [id number] DOB: [birth year]  Admit date:     06/20/2023  Discharge date: 06/24/23  Discharge Physician: Owen DELENA Lore   PCP: Arloa Jarvis, NP   Recommendations at discharge:   Close follow up with Pulmonologist for management of COPD.  Follow up resolution of influenza.  Continue counseling in regards smoking cessation.   Discharge Diagnoses: Principal Problem:   COPD exacerbation (HCC)  Resolved Problems:   * No resolved hospital problems. *  Hospital Course: 68 year old with past medical history significant for COPD/asthma, obesity, non-insulin -dependent type 2 diabetes, GERD, hypertension, A-fib status post ablation presents with acute COPD exacerbation.  He presented with worsening dyspnea cough for the last 3 to 4 days.  He was seen in the ED and received IV Solu-Medrol , Lasix  and breathing treatment he refused admission at that time.  Overnight he wake up with severe shortness of breath and presented to the ER for further evaluation.   Again admitted with COPD found to have influenza A positive.     Assessment and Plan:  1-Acute COPD exacerbation in the setting of influenza A: Acute hypoxic respiratory failure. Requiring 3 L oxygen.  Present with worsening cough, dyspnea, increased sputum production.  Chest x-ray no active disease.  BNP normal -Continue with IV Solu-Medrol  -Continue with DuoNeb -Started  guaifenesin , tolerating., will remove from allergy  -Continue with PPI changed to twice daily -Continue with  Pulmicort  and Brovana  Lasix  resume -Continue with Tamiflu  Continue  levaquin  for 5 days.  Didn't qualify for home oxygen.  Added  hypertonic saline to help with secretions.  Stable for discharge.   2-Leukocytosis;  In setting steroids.    3-GERD;  On PPI BID.    Diabetes Type 2: A1c order Resume  metformin  Continue sliding scale insulin    Hypertension History A-fib -Continue with  metoprolol  and amlodipine    Anxiety: Continue with BuSpar    Neuropathy: Continue with gabapentin    OSA: Continue with CPAP at at bedtime            Consultants: None Procedures performed: None Disposition: Home Diet recommendation:  Discharge Diet Orders (From admission, onward)     Start     Ordered   06/24/23 0000  Diet - low sodium heart healthy        06/24/23 0910           Cardiac diet DISCHARGE MEDICATION: Allergies as of 06/24/2023       Reactions   Amoxicillin  Anaphylaxis   Lisinopril Shortness Of Breath   Molds & Smuts Anaphylaxis   Robitussin [guaifenesin ] Shortness Of Breath   wheezing   Azithromycin  Other (See Comments)        Medication List     STOP taking these medications    azithromycin  250 MG tablet Commonly known as: ZITHROMAX    Benadryl  Allergy  25 mg capsule Generic drug: diphenhydrAMINE    doxycycline  100 MG tablet Commonly known as: VIBRA -TABS   lidocaine  5 % Commonly known as: Lidoderm    Linzess  72 MCG capsule Generic drug: linaclotide    omeprazole 40 MG capsule Commonly known as: PRILOSEC   Potassium Chloride  ER 20 MEQ Tbcr   Rybelsus 7 MG Tabs Generic drug: Semaglutide        TAKE these medications    acetaminophen  325 MG tablet Commonly known as: TYLENOL  Take 2 tablets (650 mg total) by mouth every 4 (four) hours as needed for headache or mild pain. What changed:  how much to take when to take this reasons  to take this   albuterol  (2.5 MG/3ML) 0.083% nebulizer solution Commonly known as: PROVENTIL  TAKE 3 MLS BY NEBULIZATION EVERY 4 HOURS AS NEEDED FOR WHEEZING OR SHORTNESS OF BREATH (((PLAN B)))   albuterol  108 (90 Base) MCG/ACT inhaler Commonly known as: VENTOLIN  HFA Inhale 2 puffs into the lungs every 4 hours as needed for shortness of breath or wheezing   amLODipine  5 MG tablet Commonly known as: NORVASC  Take 1 tablet (5 mg total) by mouth in the morning and at bedtime.   ASPIRIN  81 PO Take  81 mg by mouth daily.   Breztri  Aerosphere 160-9-4.8 MCG/ACT Aero Generic drug: Budeson-Glycopyrrol-Formoterol  Inhale 2 puffs into the lungs in the morning and at bedtime.   busPIRone  10 MG tablet Commonly known as: BUSPAR  Take 1 tablet (10 mg total) by mouth 3 (three) times daily. What changed:  when to take this reasons to take this   Dupixent  300 MG/2ML Soaj Generic drug: Dupilumab  Inject 300 mg into the skin every 14 (fourteen) days. **loading dose completed in clinic on 12/15/22**   ezetimibe  10 MG tablet Commonly known as: ZETIA  Take 1 tablet (10 mg total) by mouth daily.   famotidine  20 MG tablet Commonly known as: PEPCID  Take 20-40 mg by mouth daily as needed for heartburn or indigestion.   furosemide  20 MG tablet Commonly known as: LASIX  Take 20 mg by mouth daily as needed for fluid or edema (for 5lb weight gain). What changed: Another medication with the same name was changed. Make sure you understand how and when to take each.   furosemide  40 MG tablet Commonly known as: LASIX  Take 40 mg daily if gains 3 lbs overnight or 5 lbs in 1 week What changed:  how much to take how to take this when to take this reasons to take this additional instructions   gabapentin  300 MG capsule Commonly known as: NEURONTIN  take 1 capsule by mouth in the morning, then take 1 at lunch and 2 at bedtime What changed:  how much to take how to take this when to take this reasons to take this   ipratropium 0.06 % nasal spray Commonly known as: ATROVENT  Place 2 sprays into both nostrils 3 (three) times daily. As needed for nasal congestion, runny nose What changed:  when to take this reasons to take this additional instructions   ipratropium-albuterol  0.5-2.5 (3) MG/3ML Soln Commonly known as: DUONEB INHALE 1 VIAL VIA NEBULIZER TWICE A DAY What changed:  how much to take how to take this when to take this reasons to take this additional instructions   levofloxacin  500  MG tablet Commonly known as: LEVAQUIN  Take 1 tablet (500 mg total) by mouth daily for 2 days.   metFORMIN  500 MG 24 hr tablet Commonly known as: GLUCOPHAGE -XR Take 2 tablets (1,000 mg total) by mouth in the morning AND 1 tablet (500 mg total) every evening with meals.   metoprolol  succinate 50 MG 24 hr tablet Commonly known as: TOPROL -XL Take 1 tablet (50 mg total) by mouth daily. Take with or immediately following a meal. What changed: additional instructions   metoprolol  succinate 100 MG 24 hr tablet Commonly known as: TOPROL -XL Take 1 tablet (100 mg total) by mouth daily. Take with or immediately following a meal. What changed: additional instructions   montelukast  10 MG tablet Commonly known as: SINGULAIR  Take 1 tablet (10 mg total) by mouth at bedtime. What changed:  when to take this reasons to take this   Mucus Relief DM 30-600 MG  Tb12 Take 1 tablet by mouth 2 (two) times daily.   nicotine  7 mg/24hr patch Commonly known as: NICODERM CQ  - dosed in mg/24 hr Place 1 patch (7 mg total) onto the skin daily.   pantoprazole  40 MG tablet Commonly known as: PROTONIX  Take 1 tablet (40 mg total) by mouth 2 (two) times daily. What changed:  when to take this reasons to take this   predniSONE  10 MG tablet Commonly known as: DELTASONE  Take 4 tablets (40 mg total) by mouth daily with breakfast for 5 days. What changed:  how much to take when to take this reasons to take this   rosuvastatin  20 MG tablet Commonly known as: CRESTOR  Take 1 tablet (20 mg total) by mouth daily.   silver  sulfADIAZINE  1 % cream Commonly known as: Silvadene  Apply twice a day to affected area What changed:  how much to take how to take this when to take this reasons to take this additional instructions   traMADol  50 MG tablet Commonly known as: ULTRAM  Take 1 tablet (50 mg total) by mouth every evening. Each prescription to last 1 mth What changed:  when to take this reasons to take  this additional instructions   True Metrix Blood Glucose Test test strip Generic drug: glucose blood Use as instructed   TRUEplus Lancets 28G Misc Use as directed   valsartan  320 MG tablet Commonly known as: DIOVAN  Take 1 tablet (320 mg total) by mouth daily.   VITAMIN D PO Take 1 tablet by mouth daily.               Durable Medical Equipment  (From admission, onward)           Start     Ordered   06/23/23 0758  For home use only DME oxygen  Once       Question Answer Comment  Length of Need 6 Months   Mode or (Route) Nasal cannula   Liters per Minute 3   Frequency Continuous (stationary and portable oxygen unit needed)   Oxygen delivery system Gas      06/23/23 0758            Follow-up Information     Arloa Jarvis, NP Follow up in 1 week(s).   Specialty: Nurse Practitioner Contact information: [address] [place] [id number] [phone]                Discharge Exam: Filed Weights   06/22/23 0500 06/23/23 0740 06/24/23 0500  Weight: 98.7 kg 98.6 kg 98.6 kg   General: NAD  Condition at discharge: stable  The results of significant diagnostics from this hospitalization (including imaging, microbiology, ancillary and laboratory) are listed below for reference.   Imaging Studies: DG Chest 2 View Result Date: 06/20/2023 CLINICAL DATA:  shortness of breath.  Asthma EXAM: CHEST - 2 VIEW COMPARISON:  Chest x-ray 06/19/2023, PET CT 02/10/2023, chest x-ray 09/23/2022 FINDINGS: The heart and mediastinal contours are within normal limits. No focal consolidation. No pulmonary edema. No pleural effusion. No pneumothorax. No acute osseous abnormality. IMPRESSION: No active cardiopulmonary disease. Electronically Signed   By: Morgane  Naveau M.D.   On: 06/20/2023 07:56   DG Chest Portable 1 View Result Date: 06/19/2023 CLINICAL DATA:  Shortness of breath. EXAM: PORTABLE CHEST 1 VIEW COMPARISON:  Chest radiograph dated 09/23/2022. FINDINGS:  Minimal bibasilar interstitial streaky atelectasis. No focal consolidation, pleural effusion, or pneumothorax. The cardiac silhouette is within normal limits. No acute osseous pathology. IMPRESSION: No active disease.  Electronically Signed   By: Vanetta Chou M.D.   On: 06/19/2023 15:23    Microbiology: Results for orders placed or performed during the hospital encounter of 06/20/23  Resp panel by RT-PCR (RSV, Flu A&B, Covid) Anterior Nasal Swab     Status: Abnormal   Collection Time: 06/20/23  9:20 AM   Specimen: Anterior Nasal Swab  Result Value Ref Range Status   SARS Coronavirus 2 by RT PCR NEGATIVE NEGATIVE Final    Comment: (NOTE) SARS-CoV-2 target nucleic acids are NOT DETECTED.  The SARS-CoV-2 RNA is generally detectable in upper respiratory specimens during the acute phase of infection. The lowest concentration of SARS-CoV-2 viral copies this assay can detect is 138 copies/mL. A negative result does not preclude SARS-Cov-2 infection and should not be used as the sole basis for treatment or other patient management decisions. A negative result may occur with  improper specimen collection/handling, submission of specimen other than nasopharyngeal swab, presence of viral mutation(s) within the areas targeted by this assay, and inadequate number of viral copies(<138 copies/mL). A negative result must be combined with clinical observations, patient history, and epidemiological information. The expected result is Negative.  Fact Sheet for Patients:  bloggercourse.com  Fact Sheet for Healthcare Providers:  seriousbroker.it  This test is no t yet approved or cleared by the [place]  FDA and  has been authorized for detection and/or diagnosis of SARS-CoV-2 by FDA under an Emergency Use Authorization (EUA). This EUA will remain  in effect (meaning this test can be used) for the duration of the COVID-19 declaration under  Section 564(b)(1) of the Act, 21 U.S.C.section 360bbb-3(b)(1), unless the authorization is terminated  or revoked sooner.       Influenza A by PCR POSITIVE (A) NEGATIVE Final   Influenza B by PCR NEGATIVE NEGATIVE Final    Comment: (NOTE) The Xpert Xpress SARS-CoV-2/FLU/RSV plus assay is intended as an aid in the diagnosis of influenza from Nasopharyngeal swab specimens and should not be used as a sole basis for treatment. Nasal washings and aspirates are unacceptable for Xpert Xpress SARS-CoV-2/FLU/RSV testing.  Fact Sheet for Patients: bloggercourse.com  Fact Sheet for Healthcare Providers: seriousbroker.it  This test is not yet approved or cleared by the [place]  FDA and has been authorized for detection and/or diagnosis of SARS-CoV-2 by FDA under an Emergency Use Authorization (EUA). This EUA will remain in effect (meaning this test can be used) for the duration of the COVID-19 declaration under Section 564(b)(1) of the Act, 21 U.S.C. section 360bbb-3(b)(1), unless the authorization is terminated or revoked.     Resp Syncytial Virus by PCR NEGATIVE NEGATIVE Final    Comment: (NOTE) Fact Sheet for Patients: bloggercourse.com  Fact Sheet for Healthcare Providers: seriousbroker.it  This test is not yet approved or cleared by the [place]  FDA and has been authorized for detection and/or diagnosis of SARS-CoV-2 by FDA under an Emergency Use Authorization (EUA). This EUA will remain in effect (meaning this test can be used) for the duration of the COVID-19 declaration under Section 564(b)(1) of the Act, 21 U.S.C. section 360bbb-3(b)(1), unless the authorization is terminated or revoked.  Performed at [facility], [address], [place], [place] [address]   Respiratory (~20 pathogens) panel by PCR     Status: Abnormal   Collection Time:  06/21/23  3:24 PM   Specimen: Nasopharyngeal Swab; Respiratory  Result Value Ref Range Status   Adenovirus NOT DETECTED NOT DETECTED Final   Coronavirus 229E NOT DETECTED NOT  DETECTED Final    Comment: (NOTE) The Coronavirus on the Respiratory Panel, DOES NOT test for the novel  Coronavirus (2019 nCoV)    Coronavirus HKU1 NOT DETECTED NOT DETECTED Final   Coronavirus NL63 NOT DETECTED NOT DETECTED Final   Coronavirus OC43 NOT DETECTED NOT DETECTED Final   Metapneumovirus NOT DETECTED NOT DETECTED Final   Rhinovirus / Enterovirus NOT DETECTED NOT DETECTED Final   Influenza A H1 2009 DETECTED (A) NOT DETECTED Final   Influenza B NOT DETECTED NOT DETECTED Final   Parainfluenza Virus 1 NOT DETECTED NOT DETECTED Final   Parainfluenza Virus 2 NOT DETECTED NOT DETECTED Final   Parainfluenza Virus 3 NOT DETECTED NOT DETECTED Final   Parainfluenza Virus 4 NOT DETECTED NOT DETECTED Final   Respiratory Syncytial Virus NOT DETECTED NOT DETECTED Final   Bordetella pertussis NOT DETECTED NOT DETECTED Final   Bordetella Parapertussis NOT DETECTED NOT DETECTED Final   Chlamydophila pneumoniae NOT DETECTED NOT DETECTED Final   Mycoplasma pneumoniae NOT DETECTED NOT DETECTED Final    Comment: Performed at [facility], [address], [place], [place] [address]    Labs: CBC: Recent Labs  Lab 06/19/23 1412 06/20/23 0651 06/21/23 0433  WBC 7.5 11.7* 11.5*  NEUTROABS  --  10.5*  --   HGB 13.5 14.8 14.2  HCT 38.7* 44.6 42.8  MCV 91.9 94.9 96.2  PLT 307 402* 366   Basic Metabolic Panel: Recent Labs  Lab 06/19/23 1412 06/20/23 0651 06/21/23 0433 06/22/23 0950  NA 140 139 136 138  K 3.5 3.7 4.0 4.2  CL 104 101 99 102  CO2 25 22 25 24   GLUCOSE 159* 223* 222* 286*  BUN 11 19 41* 40*  CREATININE 0.98 1.24 1.49* 1.32*  CALCIUM  9.3 9.7 9.3 9.1   Liver Function Tests: No results for input(s): AST, ALT, ALKPHOS, BILITOT, PROT, ALBUMIN in the last 168 hours. CBG: Recent  Labs  Lab 06/23/23 0727 06/23/23 1141 06/23/23 1641 06/23/23 2051 06/24/23 0726  GLUCAP 221* 192* 247* 261* 252*    Discharge time spent: greater than 30 minutes.  Signed: Owen DELENA Lore, MD [facility] 06/24/2023

## 2023-06-24 NOTE — Progress Notes (Signed)
   06/24/23 0314  BiPAP/CPAP/SIPAP  $ Non-Invasive Home Ventilator  Subsequent  BiPAP/CPAP/SIPAP Pt Type Adult  BiPAP/CPAP/SIPAP Resmed (pt prefers self placement)  Mask Type Full face mask  Mask Size Large  Patient Home Equipment No  Auto Titrate Yes (6-11 cmH2O)

## 2023-06-24 NOTE — Progress Notes (Signed)
 TOC d/c meds in  asecure bag delivered by this RN to pt in room. PIV removed at that time as noted

## 2023-06-27 ENCOUNTER — Ambulatory Visit: Payer: Medicare PPO | Admitting: Pulmonary Disease

## 2023-06-27 ENCOUNTER — Encounter: Payer: Self-pay | Admitting: Pulmonary Disease

## 2023-06-27 VITALS — BP 131/85 | HR 114 | Ht 69.0 in | Wt 231.0 lb

## 2023-06-27 DIAGNOSIS — J45901 Unspecified asthma with (acute) exacerbation: Secondary | ICD-10-CM | POA: Diagnosis not present

## 2023-06-27 DIAGNOSIS — G4734 Idiopathic sleep related nonobstructive alveolar hypoventilation: Secondary | ICD-10-CM

## 2023-06-27 DIAGNOSIS — J4551 Severe persistent asthma with (acute) exacerbation: Secondary | ICD-10-CM

## 2023-06-27 MED ORDER — METHYLPREDNISOLONE ACETATE 80 MG/ML IJ SUSP: 80.0000 mg | Freq: Once | INTRAMUSCULAR | Status: DC

## 2023-06-27 MED ORDER — METHYLPREDNISOLONE ACETATE 80 MG/ML IJ SUSP
80.0000 mg | Freq: Once | INTRAMUSCULAR | Status: AC
  Administered 2023-06-27: 80 mg via INTRAMUSCULAR

## 2023-06-27 MED ORDER — PREDNISONE 20 MG PO TABS: 20.0000 mg | ORAL_TABLET | Freq: Every day | ORAL | 0 refills | Status: AC

## 2023-06-27 NOTE — Patient Instructions (Signed)
Steroid shot today, when he finishes steroid pills from the hospital I prescribed an additional 20 mg once a day for 5 days  Walk today to see if you need oxygen when walking  Ordered overnight oximetry, and night time test to see if your oxygen drops at night, if so we can prescribe oxygen at that point  No chest x-ray today, can come tomorrow in the morning or afternoon and get 1  No other changes in medication  Return to clinic in 3 months or sooner as needed with Dr. Judeth Horn

## 2023-06-27 NOTE — Progress Notes (Unsigned)
Patient ID: Joseph Harvey, male    DOB: 10/03/1955, 68 y.o.   MRN: [id number]  Chief Complaint  Patient presents with   Follow-up    Pt states he like a xray and O2    Referring provider: Cristino Martes, NP  HPI:   Joseph Harvey is a 68 y.o. man whom we are seeing in follow-up for dyspnea exertion, asthma.  Most recent cardiology note reviewed.  H&P, discharge summary reviewed.  Here for hospital follow-up.    Seen by me recently.  Additional course of doxycycline.  Appears to be on antibiotic for cough.  Yellow better than worse.  1 to the ED.  Left without being fully evaluated.  Went back.  Diagnosed with flu.  Placed on oxygen.  Gradually improved.  Discharged on room air after walking around he says.  He still feels quite short of breath.  Coughing a lot.  Use notes inhalers and nebulizers at [facility].  HPI at initial visit: Patient formerly seen by Dr. Sherene Sires last in 2016.  At last visit symptoms are well controlled with Symbicort.  Unclear exactly what occurred in the last 5 years.  But over the last several months he endorses worsening dyspnea on exertion.  Just walking around going to the gym he has become short of breath.  Endorses severe shortness of breath.  This is resolved within a minute or 2 of albuterol administration.  In general his cough is much better than prior.  Suspect he is adhered well to Dr. Thurston Hole instructions regarding his airway cough syndrome.  He does feel like he has significant nasal congestion and postnasal drip.  This produces mucus in the back of her throat needs to clear her cough up but feels much different than his prior cough.  Scheduled to have sinus surgery but this was discontinued due to wheezing per his report.  His insurance is changed and now needs to find a new ENT doctor.  When he gets bad bouts of that mucus buildup he will take a dose of prednisone and within a minute, instantly the mucus feels better.  In terms of his dyspnea, rest improves his  dyspnea.  There is no other aggravating or alleviating factors.  He has been on Breo for the last several months and says he thinks it helps somewhat with his breathing but is not at the level it was when he was followed by pulmonary prior.  Prior PFTs reviewed and interpreted as suggestive of mild restriction on spirometry, no fixed obstruction, lung volumes revealed TLC of 86% predicted, within normal limits or mildly reduced.  DLCO within normal limits.  Most recent chest x-ray 06/2018 reviewed instructed is clear lungs, current hyperinflation on PA film does not appear hyperinflated on lateral film.  PMH: Anxiety, obesity, diabetes, tobacco abuse, prostate cancer Surgical history: Lumbar back surgery Family History: Father with colon and lung cancer, mother with lung cancer Social history: Grew up in [place], lived there for 35 years, current smoker, 20+ pack year history   Questionaires / Pulmonary Flowsheets:   ACT:  Asthma Control Test ACT Total Score  04/18/2022  8:32 AM 17  03/10/2021  9:51 AM 13  02/26/2020  9:23 AM 9    MMRC:     No data to display          Epworth:      No data to display          Tests:   FENO:  No results found for: "NITRICOXIDE"  PFT:    Latest Ref Rng & Units 05/26/2021    8:14 AM 02/28/2020   10:53 AM  PFT Results  FVC-Pre L 2.31  2.06   FVC-Predicted Pre % 59  52   FVC-Post L 2.28  2.30   FVC-Predicted Post % 59  59   Pre FEV1/FVC % % 73  70   Post FEV1/FCV % % 72  74   FEV1-Pre L 1.69  1.43   FEV1-Predicted Pre % 56  47   FEV1-Post L 1.65  1.70   DLCO uncorrected ml/min/mmHg 19.07  21.37   DLCO UNC% % 71  80   DLCO corrected ml/min/mmHg 19.07  22.15   DLCO COR %Predicted % 71  82   DLVA Predicted % 99  123   Personally reviewed and interpreted as mixed restrictive and obstructive physiology with gas trapping, normal DLCO, repeat in 05/2021 showed no longer bronchodilator response likely consistent with better controlled  asthma, spirometry overall stable  WALK:     06/27/2023    4:01 PM  SIX MIN WALK  Supplimental Oxygen during Test? (L/min) No  Type Continuous  Tech Comments: Patient walked half a lap and O2 dropped to 83 % was placed on 3L O2 went back up to 97%    Imaging: Reviewed as per EMR  Lab Results: Personally reviewed, no significant elevation of eosinophils, IgE and RAST panel negative in past CBC    Component Value Date/Time   WBC 11.5 (H) 06/21/2023 0433   RBC 4.45 06/21/2023 0433   HGB 14.2 06/21/2023 0433   HGB 12.7 (L) 06/02/2023 0948   HCT 42.8 06/21/2023 0433   HCT 37.0 (L) 06/02/2023 0948   PLT 366 06/21/2023 0433   PLT 293 06/02/2023 0948   MCV 96.2 06/21/2023 0433   MCV 93 06/02/2023 0948   MCH 31.9 06/21/2023 0433   MCHC 33.2 06/21/2023 0433   RDW 13.6 06/21/2023 0433   RDW 12.9 06/02/2023 0948   LYMPHSABS 0.7 06/20/2023 0651   MONOABS 0.5 06/20/2023 0651   EOSABS 0.0 06/20/2023 0651   BASOSABS 0.0 06/20/2023 0651    BMET    Component Value Date/Time   NA 138 06/22/2023 0950   NA 141 06/02/2023 0948   K 4.2 06/22/2023 0950   CL 102 06/22/2023 0950   CO2 24 06/22/2023 0950   GLUCOSE 286 (H) 06/22/2023 0950   BUN 40 (H) 06/22/2023 0950   BUN 16 06/02/2023 0948   CREATININE 1.32 (H) 06/22/2023 0950   CALCIUM 9.1 06/22/2023 0950   GFRNONAA 59 (L) 06/22/2023 0950   GFRAA 68 04/14/2020 1036    BNP    Component Value Date/Time   BNP 70.4 06/20/2023 0651    ProBNP    Component Value Date/Time   PROBNP 336 (H) 09/16/2021 1422       Allergies  Allergen Reactions   Amoxicillin Anaphylaxis   Lisinopril Shortness Of Breath   Molds & Smuts Anaphylaxis   Robitussin [Guaifenesin] Shortness Of Breath    wheezing   Azithromycin Other (See Comments)    Immunization History  Administered Date(s) Administered   DTaP 03/16/2012   Fluzone Influenza virus vaccine,trivalent (IIV3), split virus 04/15/2012   Influenza Split 02/07/2013, 02/14/2015,  01/21/2016   Influenza Whole 04/15/2012, 02/10/2020   Influenza, High Dose Seasonal PF 01/28/2019, 12/23/2021   Influenza, Seasonal, Injecte, Preservative Fre 02/07/2013, 02/14/2015, 01/21/2016, 01/27/2017   Influenza,inj,Quad PF,6+ Mos 01/28/2019   Influenza,inj,Quad PF,6-35 Mos 02/08/2021   Influenza,trivalent, recombinat, inj, PF 02/07/2013, 02/14/2015  Influenza-Unspecified 02/07/2013, 02/07/2013, 02/14/2015, 02/14/2015, 01/21/2016, 01/21/2016, 12/22/2016, 12/22/2016, 01/27/2017, 01/27/2017   PFIZER(Purple Top)SARS-COV-2 Vaccination 07/29/2019, 08/19/2019, 01/07/2020, 03/17/2021   PNEUMOCOCCAL CONJUGATE-20 12/08/2021   Pneumococcal Conjugate-13 03/16/2015   Pneumococcal Polysaccharide-23 03/16/2012, 05/16/2013   Tdap 12/19/2016   Zoster Recombinant(Shingrix) 12/19/2016, 02/27/2017    Past Medical History:  Diagnosis Date   Allergy    Dust, mold, dust mites   Anemia    Anxiety    Asthma    Cancer (HCC)    prostate   Cataract    bilateral repair.   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   Pneumonia    Stress incontinence     Tobacco History: Social History   Tobacco Use  Smoking Status Every Day   Current packs/day: 0.75   Average packs/day: 0.7 packs/day for 49.1 years (36.8 ttl pk-yrs)   Types: Cigarettes   Start date: 1976  Smokeless Tobacco Never  Tobacco Comments   still smoking 0.5 ppd   Ready to quit: Not Answered Counseling given: Not Answered Tobacco comments: still smoking 0.5 ppd      Outpatient Encounter Medications as of 06/27/2023  Medication Sig   acetaminophen (TYLENOL) 325 MG tablet Take 2 tablets (650 mg total) by mouth every 4 (four) hours as needed for headache or mild pain. (Patient taking differently: Take 325 mg by mouth 2 (two) times daily as needed for headache,  mild pain (pain score 1-3) or moderate pain (pain score 4-6).)   albuterol (PROVENTIL) (2.5 MG/3ML) 0.083% nebulizer solution TAKE 3 MLS BY NEBULIZATION EVERY 4 HOURS AS NEEDED FOR WHEEZING OR SHORTNESS OF BREATH (((PLAN B)))   albuterol (VENTOLIN HFA) 108 (90 Base) MCG/ACT inhaler Inhale 2 puffs into the lungs every 4 hours as needed for shortness of breath or wheezing   amLODipine (NORVASC) 5 MG tablet Take 1 tablet (5 mg total) by mouth in the morning and at bedtime.   ASPIRIN 81 PO Take 81 mg by mouth daily.   Budeson-Glycopyrrol-Formoterol (BREZTRI AEROSPHERE) 160-9-4.8 MCG/ACT AERO Inhale 2 puffs into the lungs in the morning and at bedtime.   busPIRone (BUSPAR) 10 MG tablet Take 1 tablet (10 mg total) by mouth 3 (three) times daily. (Patient taking differently: Take 10 mg by mouth 2 (two) times daily as needed (anxiety).)   Cholecalciferol (VITAMIN D PO) Take 1 tablet by mouth daily.   dextromethorphan-guaiFENesin (MUCINEX DM) 30-600 MG 12hr tablet Take 1 tablet by mouth 2 (two) times daily.   Dupilumab (DUPIXENT) 300 MG/2ML SOAJ Inject 300 mg into the skin every 14 (fourteen) days. **loading dose completed in clinic on 12/15/22**   ezetimibe (ZETIA) 10 MG tablet Take 1 tablet (10 mg total) by mouth daily.   famotidine (PEPCID) 20 MG tablet Take 20-40 mg by mouth daily as needed for heartburn or indigestion.   furosemide (LASIX) 20 MG tablet Take 20 mg by mouth daily as needed for fluid or edema (for 5lb weight gain).   furosemide (LASIX) 40 MG tablet Take 40 mg daily if gains 3 lbs overnight or 5 lbs in 1 week (Patient taking differently: Take 40 mg by mouth daily as needed for fluid or edema (5lb weight gain).)   gabapentin (NEURONTIN) 300 MG capsule take 1 capsule by mouth in the morning, then take 1 at lunch and 2  at bedtime (Patient taking differently: Take 300 mg by mouth daily as needed (Nerve pain).)   glucose blood (TRUE METRIX BLOOD GLUCOSE TEST) test strip Use as instructed    ipratropium (ATROVENT) 0.06 % nasal spray Place 2 sprays into both nostrils 3 (three) times daily. As needed for nasal congestion, runny nose   ipratropium-albuterol (DUONEB) 0.5-2.5 (3) MG/3ML SOLN INHALE 1 VIAL VIA NEBULIZER TWICE A DAY   metFORMIN (GLUCOPHAGE-XR) 500 MG 24 hr tablet Take 2 tablets (1,000 mg total) by mouth in the morning AND 1 tablet (500 mg total) every evening with meals.   metoprolol succinate (TOPROL-XL) 50 MG 24 hr tablet Take 1 tablet (50 mg total) by mouth daily. Take with or immediately following a meal. (Patient taking differently: Take 50 mg by mouth daily. Take with 100 mg tablet to equal 150 mg)   montelukast (SINGULAIR) 10 MG tablet Take 1 tablet (10 mg total) by mouth at bedtime. (Patient taking differently: Take 10 mg by mouth at bedtime as needed (allergies).)   nicotine (NICODERM CQ - DOSED IN MG/24 HR) 7 mg/24hr patch Place 1 patch (7 mg total) onto the skin daily.   pantoprazole (PROTONIX) 40 MG tablet Take 1 tablet (40 mg total) by mouth 2 (two) times daily.   predniSONE (DELTASONE) 20 MG tablet Take 1 tablet (20 mg total) by mouth daily with breakfast for 5 days.   rosuvastatin (CRESTOR) 20 MG tablet Take 1 tablet (20 mg total) by mouth daily.   silver sulfADIAZINE (SILVADENE) 1 % cream Apply twice a day to affected area (Patient taking differently: Apply 1 Application topically as needed (rash).)   traMADol (ULTRAM) 50 MG tablet Take 1 tablet (50 mg total) by mouth every evening. Each prescription to last 1 mth (Patient taking differently: Take 50 mg by mouth 3 (three) times daily as needed for moderate pain (pain score 4-6) or severe pain (pain score 7-10).)   TRUEplus Lancets 28G MISC Use as directed   valsartan (DIOVAN) 320 MG tablet Take 1 tablet (320 mg total) by mouth daily.   [DISCONTINUED] predniSONE (DELTASONE) 10 MG tablet Take 4 tablets (40 mg total) by mouth daily with breakfast for 5 days.   [EXPIRED] levofloxacin (LEVAQUIN) 500 MG tablet Take 1  tablet (500 mg total) by mouth daily for 2 days.   metoprolol succinate (TOPROL-XL) 100 MG 24 hr tablet Take 1 tablet (100 mg total) by mouth daily. Take with or immediately following a meal. (Patient taking differently: Take 100 mg by mouth daily. Take with 50 mg tablet to equal 150 mg)   [EXPIRED] methylPREDNISolone acetate (DEPO-MEDROL) injection 80 mg    [DISCONTINUED] methylPREDNISolone acetate (DEPO-MEDROL) injection 80 mg    No facility-administered encounter medications on file as of 06/27/2023.     Review of Systems  Review of Systems  N/a Physical Exam  BP 131/85 (BP Location: Left Arm, Patient Position: Sitting, Cuff Size: Large)   Pulse (!) 114   Ht 5\' 9"  (1.753 m)   Wt 231 lb (104.8 kg)   SpO2 91%   BMI 34.11 kg/m   Wt Readings from Last 5 Encounters:  06/27/23 231 lb (104.8 kg)  06/24/23 217 lb 6 oz (98.6 kg)  06/19/23 239 lb (108.4 kg)  06/02/23 239 lb 12.8 oz (108.8 kg)  03/20/23 237 lb (107.5 kg)    BMI Readings from Last 5 Encounters:  06/27/23 34.11 kg/m  06/24/23 32.10 kg/m  06/19/23 35.29 kg/m  06/02/23 35.41 kg/m  03/20/23 35.00 kg/m  Physical Exam General: Well-appearing, sitting up in exam chair Eyes: EOMI, icterus Neck: No JVP appreciated, neck supple, Respiratory: clear, no wheeze, NWOB  cardiovascular: Regular rate, regular rhythm, no murmurs Abdomen: Nondistended, bowel sounds present MSK: No joint effusion, no synovitis Neuro: Normal gait, no weakness Psych: Normal mood, full affect    Assessment & Plan:   Dyspnea on exertion: Suspect multifactorial.  Asthma as a contributor.   Further discussion as below regarding asthma.  Other contributors include suspected anxiety/hyperventilation syndrome, obesity, deconditioning, and volume overload.  Frothy sputum, orthopnea endorsed.  Suspect some of the recent worsening due to volume overload.  Improvement in symptoms, less rescue inhaler use, nebulizer use with increasing diuretic  therapy recently.  Asthma with ongoing exacerbation due to influenza: He has atopic symptoms.  Improved with triple inhaled therapy in the past.  Intermittently has access appropriate triple inhaled therapies but intermittently too expensive, loses access.  Trelegy more recently, and improved symptoms.  Started Dupixent 12/2022 with mild improvement in symptoms.  Continue Trelegy, as needed albuterol.  Continue current prednisone dose, Depo-Medrol shot today.  Additional lower dose prednisone to taper down moving forward.  Hypoxemic respiratory failure: Ambulated around today and dropped to 83% on room air with ambulation, required 3 L nasal cannula to maintain adequate oxygen saturations, 95% on 3 L.  New prescription today for 3 L nasal cannula to be worn with exertion.  Overnight oximetry ordered on room air to assess for nocturnal hypoxemia.   Return in about 3 months (around 09/24/2023) for f/u Dr. Judeth Horn.   Karren Burly, MD 06/27/2023  I spent 42 minutes in the care of the including face-to-face this is Matt visit, review of records, coordination of care.

## 2023-06-28 ENCOUNTER — Other Ambulatory Visit: Payer: Self-pay | Admitting: Cardiology

## 2023-06-28 ENCOUNTER — Ambulatory Visit: Payer: Medicare PPO

## 2023-06-28 DIAGNOSIS — I422 Other hypertrophic cardiomyopathy: Secondary | ICD-10-CM

## 2023-06-28 DIAGNOSIS — J4551 Severe persistent asthma with (acute) exacerbation: Secondary | ICD-10-CM | POA: Diagnosis not present

## 2023-06-30 ENCOUNTER — Telehealth (HOSPITAL_COMMUNITY): Payer: Self-pay | Admitting: *Deleted

## 2023-06-30 NOTE — Telephone Encounter (Signed)
Reaching out to patient to offer assistance regarding upcoming cardiac imaging study; pt verbalizes understanding of appt date/time, parking situation and where to check in, pre-test NPO status, and verified current allergies; name and call back number provided for further questions should they arise  Larey Brick RN Navigator Cardiac Imaging [facility] [phone] office [phone] cell  Patient states he may be in Afib and his HR was "all over the place." He is aware that if his HR is too fast or if he is in Afib during his exam that he may be canceled. Patient verbalized understanding. He will arrive at 12 PM.

## 2023-07-02 ENCOUNTER — Encounter (HOSPITAL_BASED_OUTPATIENT_CLINIC_OR_DEPARTMENT_OTHER): Payer: Self-pay | Admitting: Urology

## 2023-07-02 ENCOUNTER — Emergency Department (HOSPITAL_BASED_OUTPATIENT_CLINIC_OR_DEPARTMENT_OTHER): Payer: Medicare PPO

## 2023-07-02 ENCOUNTER — Emergency Department (HOSPITAL_BASED_OUTPATIENT_CLINIC_OR_DEPARTMENT_OTHER)
Admission: EM | Admit: 2023-07-02 | Discharge: 2023-07-02 | Disposition: A | Discharge status: Home / Self Care | Payer: Medicare PPO | Attending: Emergency Medicine | Admitting: Emergency Medicine

## 2023-07-02 ENCOUNTER — Other Ambulatory Visit: Payer: Self-pay

## 2023-07-02 DIAGNOSIS — D72829 Elevated white blood cell count, unspecified: Secondary | ICD-10-CM | POA: Insufficient documentation

## 2023-07-02 DIAGNOSIS — Z79899 Other long term (current) drug therapy: Secondary | ICD-10-CM | POA: Diagnosis not present

## 2023-07-02 DIAGNOSIS — Z7984 Long term (current) use of oral hypoglycemic drugs: Secondary | ICD-10-CM | POA: Insufficient documentation

## 2023-07-02 DIAGNOSIS — E119 Type 2 diabetes mellitus without complications: Secondary | ICD-10-CM | POA: Diagnosis not present

## 2023-07-02 DIAGNOSIS — Z7982 Long term (current) use of aspirin: Secondary | ICD-10-CM | POA: Insufficient documentation

## 2023-07-02 DIAGNOSIS — M546 Pain in thoracic spine: Secondary | ICD-10-CM | POA: Diagnosis not present

## 2023-07-02 DIAGNOSIS — R0602 Shortness of breath: Secondary | ICD-10-CM | POA: Diagnosis present

## 2023-07-02 DIAGNOSIS — J441 Chronic obstructive pulmonary disease with (acute) exacerbation: Secondary | ICD-10-CM | POA: Insufficient documentation

## 2023-07-02 LAB — CBC WITH DIFFERENTIAL/PLATELET
Abs Immature Granulocytes: 0.25 10*3/uL — ABNORMAL HIGH (ref 0.00–0.07)
Basophils Absolute: 0 10*3/uL (ref 0.0–0.1)
Basophils Relative: 0 %
Eosinophils Absolute: 0 10*3/uL (ref 0.0–0.5)
Eosinophils Relative: 0 %
HCT: 39.4 % (ref 39.0–52.0)
Hemoglobin: 13.6 g/dL (ref 13.0–17.0)
Immature Granulocytes: 2 %
Lymphocytes Relative: 7 %
Lymphs Abs: 1.1 10*3/uL (ref 0.7–4.0)
MCH: 31.8 pg (ref 26.0–34.0)
MCHC: 34.5 g/dL (ref 30.0–36.0)
MCV: 92.1 fL (ref 80.0–100.0)
Monocytes Absolute: 0.3 10*3/uL (ref 0.1–1.0)
Monocytes Relative: 2 %
Neutro Abs: 13.4 10*3/uL — ABNORMAL HIGH (ref 1.7–7.7)
Neutrophils Relative %: 89 %
Platelets: 352 10*3/uL (ref 150–400)
RBC: 4.28 MIL/uL (ref 4.22–5.81)
RDW: 12.3 % (ref 11.5–15.5)
WBC: 15.1 10*3/uL — ABNORMAL HIGH (ref 4.0–10.5)
nRBC: 0 % (ref 0.0–0.2)

## 2023-07-02 LAB — BASIC METABOLIC PANEL
Anion gap: 12 (ref 5–15)
BUN: 23 mg/dL (ref 8–23)
CO2: 28 mmol/L (ref 22–32)
Calcium: 8.9 mg/dL (ref 8.9–10.3)
Chloride: 98 mmol/L (ref 98–111)
Creatinine, Ser: 1.29 mg/dL — ABNORMAL HIGH (ref 0.61–1.24)
GFR, Estimated: >60 mL/min (ref >60)
Glucose, Bld: 212 mg/dL — ABNORMAL HIGH (ref 70–99)
Potassium: 3.4 mmol/L — ABNORMAL LOW (ref 3.5–5.1)
Sodium: 138 mmol/L (ref 135–145)

## 2023-07-02 LAB — BRAIN NATRIURETIC PEPTIDE: B Natriuretic Peptide: 97.8 pg/mL (ref 0.0–100.0)

## 2023-07-02 MED ORDER — ALBUTEROL SULFATE (2.5 MG/3ML) 0.083% IN NEBU
2.5000 mg | INHALATION_SOLUTION | Freq: Once | RESPIRATORY_TRACT | Status: AC
  Administered 2023-07-02: 2.5 mg via RESPIRATORY_TRACT
  Filled 2023-07-02: qty 3

## 2023-07-02 MED ORDER — DOXYCYCLINE HYCLATE 100 MG PO CAPS: 100.0000 mg | ORAL_CAPSULE | Freq: Two times a day (BID) | ORAL | 0 refills | Status: DC

## 2023-07-02 MED ORDER — PREDNISONE 20 MG PO TABS
20.0000 mg | ORAL_TABLET | Freq: Once | ORAL | Status: AC
  Administered 2023-07-02: 20 mg via ORAL
  Filled 2023-07-02: qty 1

## 2023-07-02 MED ORDER — IPRATROPIUM-ALBUTEROL 0.5-2.5 (3) MG/3ML IN SOLN
3.0000 mL | Freq: Once | RESPIRATORY_TRACT | Status: AC
  Administered 2023-07-02: 3 mL via RESPIRATORY_TRACT
  Filled 2023-07-02: qty 3

## 2023-07-02 NOTE — Discharge Instructions (Addendum)
You were evaluated in the emergency room for shortness of breath.  Your chest x-ray did not show pneumonia.  Your lab work did not show any significant abnormality.  A prescription for an antibiotic was sent into your pharmacy please be sure to complete the full course.  Please use your prednisone as prescribed from your pulmonologist/primary care doctor as needed.  Additionally, please continue to use your home oxygen.  If you experience any new or worsening symptoms please return to the emergency room.

## 2023-07-02 NOTE — ED Notes (Signed)
RT Note: Patient currently is on 3lpm with an oxygen saturation 90%. Patient is unable to get up and walk on room air.

## 2023-07-02 NOTE — ED Triage Notes (Signed)
Pt states SOB that has worsening over past 2 days State posterior lung pain as well  When coughing or bending over is worse    RT at bedside  CT scan scheduled for tomorrow

## 2023-07-02 NOTE — ED Provider Notes (Addendum)
[place] [facility] Provider Note   CSN: [id number] Arrival date & time: 07/02/23  1559     History  Chief Complaint  Patient presents with   Shortness of Breath    Joseph Harvey is a 68 y.o. male with history of COPD/asthma on 3 L [place], type 2 diabetes, A-fib presents with complaints of shortness of breath and bilateral back pain with coughing over the past 2 days.  He was recently discharged on 2/8 for COPD exacerbation in the setting of influenza.  Was discharged on levofloxacin completed on 2/10.  He followed up with his pulmonologist on 2/11, recommended continuing Trelegy, albuterol as needed, Depo-Medrol shot given then and discharged on prednisone taper.  Patient states he took his prednisone today.  Shortness of Breath      Home Medications Prior to Admission medications   Medication Sig Start Date End Date Taking? Authorizing Provider  acetaminophen (TYLENOL) 325 MG tablet Take 2 tablets (650 mg total) by mouth every 4 (four) hours as needed for headache or mild pain. Patient taking differently: Take 325 mg by mouth 2 (two) times daily as needed for headache, mild pain (pain score 1-3) or moderate pain (pain score 4-6). 08/11/21   Graciella Freer, PA-C  albuterol (PROVENTIL) (2.5 MG/3ML) 0.083% nebulizer solution TAKE 3 MLS BY NEBULIZATION EVERY 4 HOURS AS NEEDED FOR WHEEZING OR SHORTNESS OF BREATH (((PLAN B))) 11/09/22   Hunsucker, Lesia Sago, MD  albuterol (VENTOLIN HFA) 108 (90 Base) MCG/ACT inhaler Inhale 2 puffs into the lungs every 4 hours as needed for shortness of breath or wheezing 01/11/23   Hoy Register, MD  amLODipine (NORVASC) 5 MG tablet Take 1 tablet (5 mg total) by mouth in the morning and at bedtime. 06/02/23   Azalee Course, PA  ASPIRIN 81 PO Take 81 mg by mouth daily.    [provider]  Budeson-Glycopyrrol-Formoterol (BREZTRI AEROSPHERE) 160-9-4.8 MCG/ACT AERO Inhale 2 puffs into the lungs in the morning and at  bedtime. 02/20/23   Hunsucker, Lesia Sago, MD  busPIRone (BUSPAR) 10 MG tablet Take 1 tablet (10 mg total) by mouth 3 (three) times daily. Patient taking differently: Take 10 mg by mouth 2 (two) times daily as needed (anxiety). 09/11/20   Marcine Matar, MD  Cholecalciferol (VITAMIN D PO) Take 1 tablet by mouth daily.    [provider]  dextromethorphan-guaiFENesin (MUCINEX DM) 30-600 MG 12hr tablet Take 1 tablet by mouth 2 (two) times daily. 06/24/23   Regalado, Belkys A, MD  Dupilumab (DUPIXENT) 300 MG/2ML SOAJ Inject 300 mg into the skin every 14 (fourteen) days. **loading dose completed in clinic on 12/15/22** 05/03/23   Hunsucker, Lesia Sago, MD  ezetimibe (ZETIA) 10 MG tablet Take 1 tablet (10 mg total) by mouth daily. 01/11/23   [name], MD  famotidine (PEPCID) 20 MG tablet Take 20-40 mg by mouth daily as needed for heartburn or indigestion. 05/14/23   [provider]  furosemide (LASIX) 20 MG tablet Take 20 mg by mouth daily as needed for fluid or edema (for 5lb weight gain).    [provider]  furosemide (LASIX) 40 MG tablet TAKE 40 MG DAILY IF GAINS 3 LBS OVERNIGHT OR 5 LBS IN 1 WEEK 06/28/23   [name], MD  gabapentin (NEURONTIN) 300 MG capsule take 1 capsule by mouth in the morning, then take 1 at lunch and 2 at bedtime Patient taking differently: Take 300 mg by mouth daily as needed (Nerve  pain). 12/29/20     glucose blood (TRUE METRIX BLOOD GLUCOSE TEST) test strip Use as instructed 01/07/19   Marcine Matar, MD  ipratropium (ATROVENT) 0.06 % nasal spray Place 2 sprays into both nostrils 3 (three) times daily. As needed for nasal congestion, runny nose 06/24/23   Regalado, Belkys A, MD  ipratropium-albuterol (DUONEB) 0.5-2.5 (3) MG/3ML SOLN INHALE 1 VIAL VIA NEBULIZER TWICE A DAY 06/24/23   Regalado, Belkys A, MD  metFORMIN (GLUCOPHAGE-XR) 500 MG 24 hr tablet Take 2 tablets (1,000 mg total) by mouth in the morning AND 1 tablet (500 mg  total) every evening with meals. 01/24/23   Marcine Matar, MD  metoprolol succinate (TOPROL-XL) 100 MG 24 hr tablet Take 1 tablet (100 mg total) by mouth daily. Take with or immediately following a meal. Patient taking differently: Take 100 mg by mouth daily. Take with 50 mg tablet to equal 150 mg 05/29/23 08/27/23  Jonita Albee, PA-C  metoprolol succinate (TOPROL-XL) 50 MG 24 hr tablet Take 1 tablet (50 mg total) by mouth daily. Take with or immediately following a meal. Patient taking differently: Take 50 mg by mouth daily. Take with 100 mg tablet to equal 150 mg 05/25/23 08/23/23  Jonita Albee, PA-C  montelukast (SINGULAIR) 10 MG tablet Take 1 tablet (10 mg total) by mouth at bedtime. Patient taking differently: Take 10 mg by mouth at bedtime as needed (allergies). 01/11/22   Marcine Matar, MD  nicotine (NICODERM CQ - DOSED IN MG/24 HR) 7 mg/24hr patch Place 1 patch (7 mg total) onto the skin daily. 06/24/23   Regalado, Belkys A, MD  pantoprazole (PROTONIX) 40 MG tablet Take 1 tablet (40 mg total) by mouth 2 (two) times daily. 06/24/23   Regalado, Belkys A, MD  predniSONE (DELTASONE) 20 MG tablet Take 1 tablet (20 mg total) by mouth daily with breakfast for 5 days. 06/27/23 07/02/23  Hunsucker, Lesia Sago, MD  rosuvastatin (CRESTOR) 20 MG tablet Take 1 tablet (20 mg total) by mouth daily. 02/02/22   [name], MD  silver sulfADIAZINE (SILVADENE) 1 % cream Apply twice a day to affected area Patient taking differently: Apply 1 Application topically as needed (rash). 10/14/22   Marcine Matar, MD  traMADol (ULTRAM) 50 MG tablet Take 1 tablet (50 mg total) by mouth every evening. Each prescription to last 1 mth Patient taking differently: Take 50 mg by mouth 3 (three) times daily as needed for moderate pain (pain score 4-6) or severe pain (pain score 7-10). 09/28/22   Marcine Matar, MD  TRUEplus Lancets 28G MISC Use as directed 01/07/19   Marcine Matar, MD  valsartan  (DIOVAN) 320 MG tablet Take 1 tablet (320 mg total) by mouth daily. 10/05/22 10/05/23  [name], MD      Allergies    Lisinopril and Molds & smuts    Review of Systems   Review of Systems  Respiratory:  Positive for shortness of breath.     Physical Exam Updated Vital Signs BP (!) 147/88 (BP Location: Right Arm)   Pulse (!) 55   Temp 98.1 F (36.7 C)   Resp 20   Ht 5\' 9"  (1.753 m)   Wt 104 kg   SpO2 94%   BMI 33.86 kg/m  Physical Exam Vitals and nursing note reviewed.  Constitutional:      General: He is not in acute distress.    Appearance: He is well-developed.  HENT:     Head:  Normocephalic and atraumatic.  Eyes:     Conjunctiva/sclera: Conjunctivae normal.  Cardiovascular:     Rate and Rhythm: Normal rate and regular rhythm.     Heart sounds: No murmur heard. Pulmonary:     Comments: Increased respiratory effort with diffuse rhonchi, no rales appreciated Abdominal:     Palpations: Abdomen is soft.     Tenderness: There is no abdominal tenderness.  Musculoskeletal:        General: No swelling.     Cervical back: Neck supple.     Comments: Bilateral thoracic paraspinal tenderness, no midline tenderness, there is no overlying fluctuance, induration, erythema or warmth.  Skin:    General: Skin is warm and dry.     Capillary Refill: Capillary refill takes less than 2 seconds.  Neurological:     Mental Status: He is alert.  Psychiatric:        Mood and Affect: Mood normal.     ED Results / Procedures / Treatments   Labs (all labs ordered are listed, but only abnormal results are displayed) Labs Reviewed  BASIC METABOLIC PANEL - Abnormal; Notable for the following components:      Result Value   Potassium 3.4 (*)    Glucose, Bld 212 (*)    Creatinine, Ser 1.29 (*)    All other components within normal limits  CBC WITH DIFFERENTIAL/PLATELET - Abnormal; Notable for the following components:   WBC 15.1 (*)    Neutro Abs 13.4 (*)    Abs  Immature Granulocytes 0.25 (*)    All other components within normal limits  BRAIN NATRIURETIC PEPTIDE    EKG EKG Interpretation Date/Time:  Sunday July 02 2023 16:37:54 EST Ventricular Rate:  104 PR Interval:  136 QRS Duration:  65 QT Interval:  389 QTC Calculation: 512 R Axis:   77  Text Interpretation: Sinus tachycardia Ventricular premature complex Anterior infarct, old Borderline repolarization abnormality Prolonged QT interval Confirmed by Glyn Ade [phone]) on 07/02/2023 6:40:21 PM  Radiology DG Chest 2 View Result Date: 07/02/2023 CLINICAL DATA:  Shortness of breath EXAM: CHEST - 2 VIEW COMPARISON:  06/20/2023, 06/28/2023 FINDINGS: The heart size and mediastinal contours are within normal limits. Both lungs are clear. The visualized skeletal structures are unremarkable. IMPRESSION: No active cardiopulmonary disease. Electronically Signed   By: Duanne Guess D.O.   On: 07/02/2023 17:40    Procedures Procedures    Medications Ordered in ED Medications  ipratropium-albuterol (DUONEB) 0.5-2.5 (3) MG/3ML nebulizer solution 3 mL (3 mLs Nebulization Given 07/02/23 1624)  albuterol (PROVENTIL) (2.5 MG/3ML) 0.083% nebulizer solution 2.5 mg (2.5 mg Nebulization Given 07/02/23 1625)  predniSONE (DELTASONE) tablet 20 mg (20 mg Oral Given 07/02/23 1827)    ED Course/ Medical Decision Making/ A&P Clinical Course as of 07/02/23 1846  Sun Jul 02, 2023  1839 Agree acute on chronic COPD flare. No indication for admit now.  [CC]    Clinical Course User Index [CC] Glyn Ade, MD                                 Medical Decision Making Amount and/or Complexity of Data Reviewed Labs: ordered. Radiology: ordered.  Risk Prescription drug management.   This patient presents to the ED with chief complaint(s) of shortness of breath and back pain.  The complaint involves an extensive differential diagnosis and also carries with it a high risk of complications and  morbidity.   pertinent past  medical history as listed in HPI  The differential diagnosis includes  ACS, PE, aortic dissection, pneumonia, pneumothorax, myocarditis, pericarditis, musculoskeletal, GERD, esophageal dissection, URI, bronchitis, COPD, asthma, CHF, peritonsillar abscess, anaphylaxis   The initial plan is to  Will start with basic labs, EKG, chest x-ray Additional history obtained: Records reviewed previous admission documents, Care Everywhere/External Records, and Primary Care Documents  Initial Assessment:   Hemodynamically stable, afebrile, nontoxic-appearing patient presenting with shortness of breath and bilateral thoracic back pain with coughing.  Patient has known COPD, and was recently started on 3 L during admission for COPD exacerbation last week.  Patient describes no worsening of symptoms since discharge chest shortness of breath when he is ambulating without oxygen.  Shortness of breath seem to have occurred when he was not on his oxygen walking down to the mailbox today.  His back pain is with occasional cough.  There is no midline tenderness no neurodeficits.  No red flag symptoms.  Overall suspect musculoskeletal etiology.  He has been compliant with prednisone taper from outpatient pulmonologist.  Took 40 mg earlier today on exam his lungs do have diffuse rhonchi without rales.  He does have leukocytosis, however, he had this as well during admission and was attributed to his prednisone use.  He is afebrile at this time.  Ultimately suspect COPD exacerbation in the setting of oxygen noncompliance.  He is sitting comfortably in bed now on 3 L.  Anticipate discharge home with empiric Doxy.  Independent ECG interpretation:  Sinus rhythm without ischemic changes, prolonged QT noted  Independent labs interpretation:  The following labs were independently interpreted:  Leukocytosis of 15.1, also noted to be elevated during recent hospitalizations in the setting of steroids,  BMP without significant abnormality, BMP within normal limits  Independent visualization and interpretation of imaging: I independently visualized the following imaging with scope of interpretation limited to determining acute life threatening conditions related to emergency care: CXR, which revealed no acute cardiopulmonary disease  Treatment and Reassessment: Patient given duo neb following first assessment Patient additionally given albuterol neb and prednisone 20  Upon reassessment patient is sitting comfortably satting 100% on 3 L.  His lungs continue to sound rhonchorous.  Discussed repeating breathing treatment, however patient notes that he has nebulizers at home that he uses regularly and he feels that he can return home.  Discussed discharge plan with antibiotic including steroids.  Patient states however that he has ample steroids at home that he takes as needed.  He has close follow-up with PCP scheduled.  Consultations obtained:   none  Disposition:   Patient will be discharged home.  A prescription for Doxy was sending empirically.  Patient states he has ample steroids at home.  Encouraged to use as needed.  Additionally encouraged to use his home oxygen.  The patient has been appropriately medically screened and/or stabilized in the ED. I have low suspicion for any other emergent medical condition which would require further screening, evaluation or treatment in the ED or require inpatient management. At time of discharge the patient is hemodynamically stable and in no acute distress. I have discussed work-up results and diagnosis with patient and answered all questions. Patient is agreeable with discharge plan. We discussed strict return precautions for returning to the emergency department and they verbalized understanding.     Social Determinants of Health:   none  This note was dictated with voice recognition software.  Despite best efforts at proofreading, errors may have  occurred which can change  the documentation meaning.          Final Clinical Impression(s) / ED Diagnoses Final diagnoses:  COPD exacerbation (HCC)  Bilateral thoracic back pain, unspecified chronicity    Rx / DC Orders ED Discharge Orders     None         Halford Decamp, PA-C 07/02/23 1847    Halford Decamp, PA-C 07/02/23 1851    Glyn Ade, MD 07/02/23 2329

## 2023-07-03 ENCOUNTER — Encounter: Payer: Self-pay | Admitting: Cardiology

## 2023-07-03 ENCOUNTER — Ambulatory Visit (HOSPITAL_COMMUNITY)
Admission: RE | Admit: 2023-07-03 | Discharge: 2023-07-03 | Disposition: A | Discharge status: Home / Self Care | Payer: Medicare PPO | Source: Ambulatory Visit | Attending: Cardiology | Admitting: Cardiology

## 2023-07-03 ENCOUNTER — Encounter (HOSPITAL_COMMUNITY): Payer: Self-pay

## 2023-07-03 DIAGNOSIS — R079 Chest pain, unspecified: Secondary | ICD-10-CM

## 2023-07-03 MED ORDER — IOHEXOL 350 MG/ML SOLN: 95.0000 mL | Freq: Once | INTRAVENOUS | Status: DC | PRN

## 2023-07-03 NOTE — Progress Notes (Signed)
Patient arrived to nurses station with heart rate between 100-106 bpm. Patient's rhythm appears to be sinus tach.  Patient's heart rate came down to 100 bpm with breath hold.  Patient's blood pressure 138/77.  Called and spoke to Dr. Eden Emms who would like to cancel CT heart scan for today due to heart rate. Dr. Eden Emms stated to inform team to consider medication with reschedule or alternative testing. Pt did not have any pre-medication but does take lopressor 150mg  daily.  Patient was holding his albuterol inhaler when I entered the room and reported he used his inhaler within in the hour. Patient is short of breath and is on oxygen as well.

## 2023-07-03 NOTE — Telephone Encounter (Signed)
Patient identification verified by 2 forms. Marilynn Rail, RN    Called and spoke to patient  Patient states:   -recent reading are of Oxygen and heart   -heart rate keeps varying   -highest heart rate is 140-150s   -at rest heart rate can be in the 60s but then jumps   -takes Metoprolol 150mg  daily   -does not notice any decreasing in heart rate with increased Metoprolol   -could not complete cardiac CT due to elevated heart rate  -patient states sometimes has chest pressure   -takes 40mg  daily of lasix, has swelling  -Presented to ED yesterday, on 3L O2  -unable to provide BP readings   -"BP is great"  Patient denies:   -chest pain  Offered patient 2/18 OV, patient declines  Informed patient message sent to Dr. Bjorn Pippin  Reviewed ED warning signs/precautions  Patient verbalized understanding, no questions at this time

## 2023-07-04 NOTE — Telephone Encounter (Signed)
Can we add him onto my schedule on 2/19 at 920?

## 2023-07-05 ENCOUNTER — Ambulatory Visit: Payer: Medicare PPO | Attending: Cardiology | Admitting: Cardiology

## 2023-07-05 ENCOUNTER — Encounter: Payer: Self-pay | Admitting: Cardiology

## 2023-07-05 VITALS — BP 138/74 | HR 93 | Ht 70.8 in | Wt 237.4 lb

## 2023-07-05 DIAGNOSIS — I10 Essential (primary) hypertension: Secondary | ICD-10-CM

## 2023-07-05 DIAGNOSIS — M7989 Other specified soft tissue disorders: Secondary | ICD-10-CM | POA: Diagnosis not present

## 2023-07-05 DIAGNOSIS — I422 Other hypertrophic cardiomyopathy: Secondary | ICD-10-CM

## 2023-07-05 DIAGNOSIS — Z720 Tobacco use: Secondary | ICD-10-CM

## 2023-07-05 DIAGNOSIS — R0609 Other forms of dyspnea: Secondary | ICD-10-CM

## 2023-07-05 LAB — MAGNESIUM: Magnesium: 1.6 mg/dL (ref 1.6–2.3)

## 2023-07-05 LAB — COMPREHENSIVE METABOLIC PANEL
ALT: 57 [IU]/L — ABNORMAL HIGH (ref 0–44)
AST: 30 [IU]/L (ref 0–40)
Albumin: 4.2 g/dL (ref 3.9–4.9)
Alkaline Phosphatase: 74 [IU]/L (ref 44–121)
BUN/Creatinine Ratio: 15 (ref 10–24)
BUN: 17 mg/dL (ref 8–27)
Bilirubin Total: 0.6 mg/dL (ref 0.0–1.2)
CO2: 25 mmol/L (ref 20–29)
Calcium: 9.2 mg/dL (ref 8.6–10.2)
Chloride: 98 mmol/L (ref 96–106)
Creatinine, Ser: 1.12 mg/dL (ref 0.76–1.27)
Globulin, Total: 1.9 g/dL (ref 1.5–4.5)
Glucose: 206 mg/dL — ABNORMAL HIGH (ref 70–99)
Potassium: 4.2 mmol/L (ref 3.5–5.2)
Sodium: 139 mmol/L (ref 134–144)
Total Protein: 6.1 g/dL (ref 6.0–8.5)
eGFR: 72 mL/min/{1.73_m2} (ref >59)

## 2023-07-05 MED ORDER — FUROSEMIDE 40 MG PO TABS
ORAL_TABLET | ORAL | 3 refills | Status: DC
Start: 2023-07-05 — End: 2023-09-14

## 2023-07-05 MED ORDER — POTASSIUM CHLORIDE CRYS ER 20 MEQ PO TBCR: 20.0000 meq | EXTENDED_RELEASE_TABLET | Freq: Every day | ORAL | 3 refills | Status: DC

## 2023-07-05 NOTE — Patient Instructions (Addendum)
Medication Instructions:  Take furosemide (Lasix) 40 mg twice a day for three days and resume lasix 40 mg daily  normal dosage as discussed with your provider Start Potassium  KCL when you take your lasix as discussed with your provider *If you need a refill on your cardiac medications before your next appointment, please call your pharmacy*   Lab Work: Mg, cmet If you have labs (blood work) drawn today and your tests are completely normal, you will receive your results only by: MyChart Message (if you have MyChart) OR A paper copy in the mail If you have any lab test that is abnormal or we need to change your treatment, we will call you to review the results.   Testing/Procedures: ECHO  Your physician has requested that you have an echocardiogram. Echocardiography is a painless test that uses sound waves to create images of your heart. It provides your doctor with information about the size and shape of your heart and how well your heart's chambers and valves are working. This procedure takes approximately one hour. There are no restrictions for this procedure. Please do NOT wear cologne, perfume, aftershave, or lotions (deodorant is allowed). Please arrive 15 minutes prior to your appointment time.  Please note: We ask at that you not bring children with you during ultrasound (echo/ vascular) testing. Due to room size and safety concerns, children are not allowed in the ultrasound rooms during exams. Our front office staff cannot provide observation of children in our lobby area while testing is being conducted. An adult accompanying a patient to their appointment will only be allowed in the ultrasound room at the discretion of the ultrasound technician under special circumstances. We apologize for any inconvenience.    Follow-Up: At [facility], you and your health needs are our priority.  As part of our continuing mission to provide you with exceptional heart care, we have  created designated Provider Care Teams.  These Care Teams include your primary Cardiologist (physician) and Advanced Practice Providers (APPs -  Physician Assistants and Nurse Practitioners) who all work together to provide you with the care you need, when you need it.  We recommend signing up for the patient portal called "MyChart".  Sign up information is provided on this After Visit Summary.  MyChart is used to connect with patients for Virtual Visits (Telemedicine).  Patients are able to view lab/test results, encounter notes, upcoming appointments, etc.  Non-urgent messages can be sent to your provider as well.   To learn more about what you can do with MyChart, go to [email or web address].    Your next appointment:   1 week(s) .  Your next appt is  2/26 @10 :20  Provider:   [name], MD     Other Instructions Please wear ted hose on morning and take off at bedtime

## 2023-07-05 NOTE — Progress Notes (Unsigned)
Cardiology Office Note:    Date:  07/06/2023   ID:  Joseph Harvey, DOB 1955-11-21, MRN [id number]  PCP:  Cristino Martes, NP  Cardiologist:  [name], MD  Electrophysiologist:  Regan Lemming, MD   Referring MD: Cristino Martes, NP   Chief Complaint  Patient presents with   Cardiomyopathy    History of Present Illness:    Joseph Harvey is a 68 y.o. male with a hx of prostate cancer, T2DM, hypertension, hyperlipidemia, toabbaco use who presents for follow-up.  He was referred by Dr. Laural Benes for evaluation of dyspnea on exertion, intially seen on 04/14/20.  Hospital he reports that he has been having chest pain with minimal exertion.  Also describes having chest pain.  Reports sharp left-sided chest pain, can occur at rest or with exertion.  Typically last for 10 minutes or so.  Also states he has been having occasional lightheadedness, denies any syncope.  Denies any lower extremity edema.  Reports BP has been anywhere from 110s to 160s when he checks at home.  Reports he had a prior test for sleep apnea which was negative.  He has smoked since age 54, less than 1 pack/day.  No known history of heart disease in his immediate family.  Echocardiogram on 03/18/2020 showed severe septal hypertrophy with otherwise mild concentric hypertrophy, mild intracavitary gradient (peak gradient 30 mmHg), normal biventricular function, grade 1 diastolic dysfunction, no significant valvular disease.  Lexiscan Myoview on 04/29/2020 showed normal perfusion, EF 57%.  Preventives monitor x3 days on 05/06/2020 showed 7 episodes of NSVT, longest lasting 3 beats, occasional PVCs (3.9% of beats).  Calcium score 117 on 04/29/2020 (81st percentile).  Cardiac MRI 06/23/2020 showed asymmetric hypertrophy measuring up to 20 mm in basal anterior septum (8 mm and posterior wall), consistent with hypertrophic cardiomyopathy.  Also with patchy LGE in basal septum and RV insertion sites consistent with HCM; LGE accounts  for 11% of total myocardial mass.  Normal LV/RV size and systolic function.  Echo on 04/14/2021 showed EF of 60 to 65%, severe asymmetric hypertrophy of basal septum, no LVOT obstruction, grade 1 diastolic dysfunction, normal RV function, no significant valvular disease.  Referred to EP for evaluation, underwent PVC ablation with Dr. Elberta Fortis 08/10/2021.  Repeat Zio patch x3 days on 10/07/2021 showed PVCs represent 1.6% of beats.  Echocardiogram 08/2022 showed hyperdynamic LV function with near mid cavity obliteration during systole with gradient up to 32 mmHg with Valsalva.  Since last clinic visit, he reports he has been having worsening lower extremity edema.  Also reports intermittent pressure in chest and shortness of breath.  He is taking Lasix 40 mg daily, started on this dose last weeks.  He continues to smoke 1 to 2 cigarettes/day.  Wt Readings from Last 3 Encounters:  07/05/23 237 lb 6.4 oz (107.7 kg)  07/02/23 229 lb 4.5 oz (104 kg)  06/27/23 231 lb (104.8 kg)     BP Readings from Last 3 Encounters:  07/05/23 138/74  07/03/23 138/77  07/02/23 (!) 147/88    Wt Readings from Last 3 Encounters:  07/05/23 237 lb 6.4 oz (107.7 kg)  07/02/23 229 lb 4.5 oz (104 kg)  06/27/23 231 lb (104.8 kg)     Past Medical History:  Diagnosis Date   Allergy    Dust, mold, dust mites   Anemia    Anxiety    Asthma    Cancer (HCC)    prostate   Cataract    bilateral repair.   COVID  Diabetes mellitus without complication (HCC)    GERD (gastroesophageal reflux disease)    Glaucoma    Hyperlipidemia    Hypertension    Neuromuscular disorder (HCC)    nerve damage from back surgery   Pneumonia    Stress incontinence     Past Surgical History:  Procedure Laterality Date   BACK SURGERY  2017   CATARACT EXTRACTION Bilateral    COLONOSCOPY     2013   COLONOSCOPY  10/03/2019   KNEE ARTHROSCOPY  2005   right   PROSTATE BIOPSY     PROSTATE SURGERY     PVC ABLATION N/A 08/10/2021    Procedure: PVC ABLATION;  Surgeon: Regan Lemming, MD;  Location: [facility];  Service: Cardiovascular;  Laterality: N/A;   ROBOT ASSISTED LAPAROSCOPIC RADICAL PROSTATECTOMY  03/2010    Current Medications: Current Meds  Medication Sig   acetaminophen (TYLENOL) 325 MG tablet Take 2 tablets (650 mg total) by mouth every 4 (four) hours as needed for headache or mild pain. (Patient taking differently: Take 325 mg by mouth 2 (two) times daily as needed for headache, mild pain (pain score 1-3) or moderate pain (pain score 4-6).)   albuterol (PROVENTIL) (2.5 MG/3ML) 0.083% nebulizer solution TAKE 3 MLS BY NEBULIZATION EVERY 4 HOURS AS NEEDED FOR WHEEZING OR SHORTNESS OF BREATH (((PLAN B)))   albuterol (VENTOLIN HFA) 108 (90 Base) MCG/ACT inhaler Inhale 2 puffs into the lungs every 4 hours as needed for shortness of breath or wheezing   amLODipine (NORVASC) 5 MG tablet Take 1 tablet (5 mg total) by mouth in the morning and at bedtime.   ASPIRIN 81 PO Take 81 mg by mouth daily.   Budeson-Glycopyrrol-Formoterol (BREZTRI AEROSPHERE) 160-9-4.8 MCG/ACT AERO Inhale 2 puffs into the lungs in the morning and at bedtime.   busPIRone (BUSPAR) 10 MG tablet Take 1 tablet (10 mg total) by mouth 3 (three) times daily. (Patient taking differently: Take 10 mg by mouth 2 (two) times daily as needed (anxiety).)   Cholecalciferol (VITAMIN D PO) Take 1 tablet by mouth daily.   dextromethorphan-guaiFENesin (MUCINEX DM) 30-600 MG 12hr tablet Take 1 tablet by mouth 2 (two) times daily.   doxycycline (VIBRAMYCIN) 100 MG capsule Take 1 capsule (100 mg total) by mouth 2 (two) times daily.   Dupilumab (DUPIXENT) 300 MG/2ML SOAJ Inject 300 mg into the skin every 14 (fourteen) days. **loading dose completed in clinic on 12/15/22**   ezetimibe (ZETIA) 10 MG tablet Take 1 tablet (10 mg total) by mouth daily.   famotidine (PEPCID) 20 MG tablet Take 20-40 mg by mouth daily as needed for heartburn or indigestion.    gabapentin (NEURONTIN) 300 MG capsule take 1 capsule by mouth in the morning, then take 1 at lunch and 2 at bedtime (Patient taking differently: Take 300 mg by mouth daily as needed (Nerve pain).)   glucose blood (TRUE METRIX BLOOD GLUCOSE TEST) test strip Use as instructed   ipratropium (ATROVENT) 0.06 % nasal spray Place 2 sprays into both nostrils 3 (three) times daily. As needed for nasal congestion, runny nose   metFORMIN (GLUCOPHAGE-XR) 500 MG 24 hr tablet Take 2 tablets (1,000 mg total) by mouth in the morning AND 1 tablet (500 mg total) every evening with meals.   metoprolol succinate (TOPROL-XL) 50 MG 24 hr tablet Take 1 tablet (50 mg total) by mouth daily. Take with or immediately following a meal. (Patient taking differently: Take 50 mg by mouth daily. Take with 100 mg tablet  to equal 150 mg)   montelukast (SINGULAIR) 10 MG tablet Take 1 tablet (10 mg total) by mouth at bedtime. (Patient taking differently: Take 10 mg by mouth at bedtime as needed (allergies).)   nicotine (NICODERM CQ - DOSED IN MG/24 HR) 7 mg/24hr patch Place 1 patch (7 mg total) onto the skin daily.   pantoprazole (PROTONIX) 40 MG tablet Take 1 tablet (40 mg total) by mouth 2 (two) times daily.   potassium chloride SA (KLOR-CON M) 20 MEQ tablet Take 1 tablet (20 mEq total) by mouth daily.   rosuvastatin (CRESTOR) 20 MG tablet Take 1 tablet (20 mg total) by mouth daily.   silver sulfADIAZINE (SILVADENE) 1 % cream Apply twice a day to affected area (Patient taking differently: Apply 1 Application topically as needed (rash).)   traMADol (ULTRAM) 50 MG tablet Take 1 tablet (50 mg total) by mouth every evening. Each prescription to last 1 mth (Patient taking differently: Take 50 mg by mouth 3 (three) times daily as needed for moderate pain (pain score 4-6) or severe pain (pain score 7-10).)   TRUEplus Lancets 28G MISC Use as directed   valsartan (DIOVAN) 320 MG tablet Take 1 tablet (320 mg total) by mouth daily.    [DISCONTINUED] furosemide (LASIX) 20 MG tablet Take 20 mg by mouth daily as needed for fluid or edema (for 5lb weight gain).   [DISCONTINUED] furosemide (LASIX) 40 MG tablet TAKE 40 MG DAILY IF GAINS 3 LBS OVERNIGHT OR 5 LBS IN 1 WEEK     Allergies:   Lisinopril and Molds & smuts   Social History   Socioeconomic History   Marital status: Single    Spouse name: Not on file   Number of children: 2   Years of education: 12 grade   Highest education level: Not on file  Occupational History   Occupation: Facilities manager: [employer]  Tobacco Use   Smoking status: Every Day    Current packs/day: 0.75    Average packs/day: 0.8 packs/day for 49.1 years (36.9 ttl pk-yrs)    Types: Cigarettes    Start date: 1976   Smokeless tobacco: Never   Tobacco comments:    still smoking 0.5 ppd  Vaping Use   Vaping status: Never Used  Substance and Sexual Activity   Alcohol use: Not Currently    Alcohol/week: 3.0 standard drinks of alcohol    Types: 3 Cans of beer per week   Drug use: No   Sexual activity: Not on file  Other Topics Concern   Not on file  Social History Narrative   Not on file   Social Drivers of Health   Financial Resource Strain: Not on file  Food Insecurity: No Food Insecurity (06/20/2023)   Hunger Vital Sign    Worried About Running Out of Food in the Last Year: Never true    Ran Out of Food in the Last Year: Never true  Transportation Needs: No Transportation Needs (06/20/2023)   PRAPARE - Administrator, Civil Service (Medical): No    Lack of Transportation (Non-Medical): No  Physical Activity: Not on file  Stress: No Stress Concern Present (05/26/2020)   [employer] of Occupational Health - Occupational Stress Questionnaire    Feeling of Stress : Only a little  Social Connections: Unknown (06/20/2023)   Social Connection and Isolation Panel [NHANES]    Frequency of Communication with Friends and Family: Three times a week    Frequency of  Social  Gatherings with Friends and Family: Twice a week    Attends Religious Services: Patient declined    Database administrator or Organizations: No    Attends Engineer, structural: Never    Marital Status: Never married     Family History: The patient's family history includes Allergies in his brother; Asthma in his mother; Colon cancer in his father; Diabetes in his maternal grandmother; Lung cancer in his father and mother; Prostate cancer in his father. There is no history of Colon polyps, Esophageal cancer, Stomach cancer, or Rectal cancer.  ROS:   Please see the history of present illness.     All other systems reviewed and are negative.  EKGs/Labs/Other Studies Reviewed:    The following studies were reviewed today:   EKG:   07/05/23: Normal sinus rhythm, no PVCs, poor R wave progression, rate 93 09/24/21: Normal sinus rhythm, no PVCs, poor R wave progression 05/05/21: Normal sinus rhythm, PVCs, Q waves in V1/2 04/14/20: normal sinus rhythm, PVCs, no ST abnormalities 05/18/20: EKG was not ordered. 07/14/20: EKG was not ordered. 09/17/20: Sinus rhythm. Rate 96 bpm PVCs, No ST abnormalities. 03/25/21: Normal sinus rhythm, frequent PVCs (trigeminy)  Recent Labs: 07/02/2023: B Natriuretic Peptide 97.8; Hemoglobin 13.6; Platelets 352 07/05/2023: ALT 57; BUN 17; Creatinine, Ser 1.12; Magnesium 1.6; Potassium 4.2; Sodium 139  Recent Lipid Panel    Component Value Date/Time   CHOL 135 08/08/2022 0838   TRIG 242 (H) 08/08/2022 0838   HDL 59 08/08/2022 0838   CHOLHDL 2.3 08/08/2022 0838   LDLCALC 39 08/08/2022 0838    Physical Exam:    VS:  BP 138/74 (BP Location: Left Arm, Patient Position: Sitting, Cuff Size: Normal)   Pulse 93   Ht 5' 10.8" (1.798 m)   Wt 237 lb 6.4 oz (107.7 kg)   SpO2 98%   BMI 33.30 kg/m     Wt Readings from Last 3 Encounters:  07/05/23 237 lb 6.4 oz (107.7 kg)  07/02/23 229 lb 4.5 oz (104 kg)  06/27/23 231 lb (104.8 kg)     GEN:  in no  acute distress HEENT: Normal NECK: No JVD CARDIAC: RRR, no murmurs, rubs, gallops RESPIRATORY: Scattered expiratory wheezing ABDOMEN: Soft, non-tender, non-distended MUSCULOSKELETAL: 2+ BLE edema; No deformity  SKIN: Warm and dry NEUROLOGIC:  Alert and oriented x 3 PSYCHIATRIC:  Normal affect   ASSESSMENT:    1. Hypertrophic cardiomyopathy (HCC)   2. Essential hypertension   3. Tobacco use   4. Leg swelling   5. DOE (dyspnea on exertion)      PLAN:    HCM: Cardiac MRI 06/23/2020 showed asymmetric hypertrophy measuring up to 20 mm and basal anterior septum (8 mm and posterior wall), consistent with hypertrophic cardiomyopathy.  Also with patchy LGE in basal septum and RV insertion sites consistent with HCM; LGE accounts for 11% of total myocardial mass.  Normal LV/RV size and systolic function.  Echocardiogram on 03/18/2020 showed mild LVOT obstruction, peak gradient 13 mmHg.  Echocardiogram 08/2022 showed hyperdynamic LV function with near mid cavity obliteration during systole with gradient up to 32 mmHg with Valsalva. -Recommended first-degree relatives be screened for HCM: He reports he has talked to his daughters and brother/sisters.  Discussed genetic testing but he declined -Ideally try to avoid diuretics in HCM but he is volume overloaded on exam.  Continue Lasix 40 mg daily.  Check BMET, magnesium -Continue Toprol-XL 150 mg daily -NSVT, frequent PVCs on Zio patch.  Referred to EP for evaluation,  underwent PVC ablation with Dr Elberta Fortis on 08/10/21 -Update echocardiogram to evaluate for worsening LVOT obstruction since starting Lasix  Lower extremity edema: Bilateral swelling in feet/ankles.  Normal BNP, CMET 07/2022.  Suspect related to amlodipine use, dose reduced to 5 mg daily.   -Now with worsening lower extremity edema, 1+ bilateral edema on exam today.  He has been started on Lasix 40 mg daily with some improvement.  Recommend taking 40 mg twice daily x 3 days then decrease dose  to 40 mg daily.  Check BMET, magnesium  Chest pain/DOE: Chest pain atypical in description but does have CAD risk factors (hypertension, hyperlipidemia, T2DM).   Lexiscan Myoview on 04/29/2020 showed normal perfusion, EF 57%.  Calcium score 117 on 04/29/2020 (81st percentile). -He is reporting worsening chest pain recently, atypical in description.  Coronary CTA was ordered but was canceled due to elevated heart rate.  Not a good Lexiscan candidate given wheezing on exam, can reevaluate as his COPD improves.    PVCs: [employer] monitor x3 days on 05/06/2020 showed 7 episodes of NSVT, longest lasting 3 beats, occasional PVCs (3.9% of beats).  Zio patch x3 days on 04/06/2021 showed frequent PVCs (14.3% of beats), 1 episode of NSVT x4 beats -Referred to EP for evaluation, underwent PVC ablation with Dr. Elberta Fortis 08/10/2021.  Significantly improved, repeat Zio patch x3 days on 10/07/2021 showed PVCs represent 1.6% of beats. -Zio patch x 2 weeks 03/2023 showed more frequent PVCs (7.7% of beats) and 7 episodes of NSVT with longest lasting 12 beats, Toprol-XL dose increased to 150 mg daily  Leg pain: Normal ABIs on 04/23/2019  Hypertension: On amlodipine 5 mg daily, valsartan 320 mg, Toprol-XL 150 mg daily.  Appears controlled  Tobacco use: Patient counseled on the risk of tobacco use and cessation strongly encouraged.  Hyperlipidemia: On rosuvastatin 40 mg daily and Zetia 10 mg daily, LDL 53 on 03/25/2021.  Calcium score 117 on 04/29/2020 (81st percentile).  LDL 24 08/22/2021, rosuvastatin decreased to 20 mg daily.  LDL 39 on 08/08/2022  T2DM: On Metformin.  A1c 7.3% 06/19/33  OSA: Sleep study 10/2020 showed severe OSA, he is now on CPAP and reports compliance  RTC in 1 week   Medication Adjustments/Labs and Tests Ordered: Current medicines are reviewed at length with the patient today.  Concerns regarding medicines are outlined above.  Orders Placed This Encounter  Procedures   Magnesium    Comprehensive Metabolic Panel (CMET)   EKG 12-Lead   ECHOCARDIOGRAM COMPLETE    Meds ordered this encounter  Medications   potassium chloride SA (KLOR-CON M) 20 MEQ tablet    Sig: Take 1 tablet (20 mEq total) by mouth daily.    Dispense:  90 tablet    Refill:  3   furosemide (LASIX) 40 MG tablet    Sig: Take 40 mg daily    Dispense:  90 tablet    Refill:  3     Patient Instructions  Medication Instructions:  Take furosemide (Lasix) 40 mg twice a day for three days and resume lasix 40 mg daily  normal dosage as discussed with your provider Start Potassium  KCL when you take your lasix as discussed with your provider *If you need a refill on your cardiac medications before your next appointment, please call your pharmacy*   Lab Work: Mg, cmet If you have labs (blood work) drawn today and your tests are completely normal, you will receive your results only by: MyChart Message (if you have MyChart) OR A paper  copy in the mail If you have any lab test that is abnormal or we need to change your treatment, we will call you to review the results.   Testing/Procedures: ECHO  Your physician has requested that you have an echocardiogram. Echocardiography is a painless test that uses sound waves to create images of your heart. It provides your doctor with information about the size and shape of your heart and how well your heart's chambers and valves are working. This procedure takes approximately one hour. There are no restrictions for this procedure. Please do NOT wear cologne, perfume, aftershave, or lotions (deodorant is allowed). Please arrive 15 minutes prior to your appointment time.  Please note: We ask at that you not bring children with you during ultrasound (echo/ vascular) testing. Due to room size and safety concerns, children are not allowed in the ultrasound rooms during exams. Our front office staff cannot provide observation of children in our lobby area while testing is  being conducted. An adult accompanying a patient to their appointment will only be allowed in the ultrasound room at the discretion of the ultrasound technician under special circumstances. We apologize for any inconvenience.    Follow-Up: At [facility], you and your health needs are our priority.  As part of our continuing mission to provide you with exceptional heart care, we have created designated Provider Care Teams.  These Care Teams include your primary Cardiologist (physician) and Advanced Practice Providers (APPs -  Physician Assistants and Nurse Practitioners) who all work together to provide you with the care you need, when you need it.  We recommend signing up for the patient portal called "MyChart".  Sign up information is provided on this After Visit Summary.  MyChart is used to connect with patients for Virtual Visits (Telemedicine).  Patients are able to view lab/test results, encounter notes, upcoming appointments, etc.  Non-urgent messages can be sent to your provider as well.   To learn more about what you can do with MyChart, go to [email or web address].    Your next appointment:   1 week(s) .  Your next appt is  2/26 @10 :20  Provider:   [name], MD     Other Instructions Please wear ted hose on morning and take off at bedtime       Signed, [name], MD  07/06/2023 1:16 PM    [facility]

## 2023-07-07 ENCOUNTER — Encounter: Payer: Self-pay | Admitting: *Deleted

## 2023-07-07 ENCOUNTER — Ambulatory Visit: Payer: Medicare HMO | Admitting: Cardiology

## 2023-07-07 ENCOUNTER — Telehealth: Payer: Self-pay | Admitting: *Deleted

## 2023-07-07 MED ORDER — MAGNESIUM OXIDE -MG SUPPLEMENT 400 (240 MG) MG PO TABS: 400.0000 mg | ORAL_TABLET | Freq: Every day | ORAL | 3 refills | Status: AC

## 2023-07-07 NOTE — Telephone Encounter (Signed)
Called and made patient aware per Dr. Bjorn Pippin  Magnesium is low, recommend adding magnesium oxide 400 mg daily. Orders reviewed and sent to patient's pharmacy.  Patient verbalized an understanding

## 2023-07-08 ENCOUNTER — Other Ambulatory Visit: Payer: Self-pay | Admitting: Pulmonary Disease

## 2023-07-08 DIAGNOSIS — J4550 Severe persistent asthma, uncomplicated: Secondary | ICD-10-CM

## 2023-07-09 NOTE — Progress Notes (Unsigned)
 Cardiology Office Note:    Date:  07/12/2023   ID:  Joseph Harvey, DOB 1955-11-02, MRN [id number]  PCP:  Cristino Martes, NP  Cardiologist:  [name], MD  Electrophysiologist:  Regan Lemming, MD   Referring MD: Cristino Martes, NP   Chief Complaint  Patient presents with   Follow-up   Edema   Shortness of Breath    History of Present Illness:    Joseph Harvey is a 68 y.o. male with a hx of prostate cancer, T2DM, hypertension, hyperlipidemia, toabbaco use who presents for follow-up.  He was referred by Dr. Laural Benes for evaluation of dyspnea on exertion, intially seen on 04/14/20.  Hospital he reports that he has been having chest pain with minimal exertion.  Also describes having chest pain.  Reports sharp left-sided chest pain, can occur at rest or with exertion.  Typically last for 10 minutes or so.  Also states he has been having occasional lightheadedness, denies any syncope.  Denies any lower extremity edema.  Reports BP has been anywhere from 110s to 160s when he checks at home.  Reports he had a prior test for sleep apnea which was negative.  He has smoked since age 44, less than 1 pack/day.  No known history of heart disease in his immediate family.  Echocardiogram on 03/18/2020 showed severe septal hypertrophy with otherwise mild concentric hypertrophy, mild intracavitary gradient (peak gradient 30 mmHg), normal biventricular function, grade 1 diastolic dysfunction, no significant valvular disease.  Lexiscan Myoview on 04/29/2020 showed normal perfusion, EF 57%.  Preventives monitor x3 days on 05/06/2020 showed 7 episodes of NSVT, longest lasting 3 beats, occasional PVCs (3.9% of beats).  Calcium score 117 on 04/29/2020 (81st percentile).  Cardiac MRI 06/23/2020 showed asymmetric hypertrophy measuring up to 20 mm in basal anterior septum (8 mm and posterior wall), consistent with hypertrophic cardiomyopathy.  Also with patchy LGE in basal septum and RV insertion sites  consistent with HCM; LGE accounts for 11% of total myocardial mass.  Normal LV/RV size and systolic function.  Echo on 04/14/2021 showed EF of 60 to 65%, severe asymmetric hypertrophy of basal septum, no LVOT obstruction, grade 1 diastolic dysfunction, normal RV function, no significant valvular disease.  Referred to EP for evaluation, underwent PVC ablation with Dr. Elberta Fortis 08/10/2021.  Repeat Zio patch x3 days on 10/07/2021 showed PVCs represent 1.6% of beats.  Echocardiogram 08/2022 showed hyperdynamic LV function with near mid cavity obliteration during systole with gradient up to 32 mmHg with Valsalva.  Since last clinic visit, he reports he is doing okay.  He continues to have lower extremity edema.  Also continues to report chest pain and shortness of breath.  He is smoking 6 to 7 cigarettes/day.  Wt Readings from Last 3 Encounters:  07/12/23 235 lb (106.6 kg)  07/05/23 237 lb 6.4 oz (107.7 kg)  07/02/23 229 lb 4.5 oz (104 kg)     BP Readings from Last 3 Encounters:  07/12/23 120/60  07/05/23 138/74  07/03/23 138/77    Wt Readings from Last 3 Encounters:  07/12/23 235 lb (106.6 kg)  07/05/23 237 lb 6.4 oz (107.7 kg)  07/02/23 229 lb 4.5 oz (104 kg)     Past Medical History:  Diagnosis Date   Allergy    Dust, mold, dust mites   Anemia    Anxiety    Asthma    Cancer (HCC)    prostate   Cataract    bilateral repair.   COVID    Diabetes  mellitus without complication (HCC)    GERD (gastroesophageal reflux disease)    Glaucoma    Hyperlipidemia    Hypertension    Neuromuscular disorder (HCC)    nerve damage from back surgery   Pneumonia    Stress incontinence     Past Surgical History:  Procedure Laterality Date   BACK SURGERY  2017   CATARACT EXTRACTION Bilateral    COLONOSCOPY     2013   COLONOSCOPY  10/03/2019   KNEE ARTHROSCOPY  2005   right   PROSTATE BIOPSY     PROSTATE SURGERY     PVC ABLATION N/A 08/10/2021   Procedure: PVC ABLATION;  Surgeon: Regan Lemming, MD;  Location: [facility];  Service: Cardiovascular;  Laterality: N/A;   ROBOT ASSISTED LAPAROSCOPIC RADICAL PROSTATECTOMY  03/2010    Current Medications: Current Meds  Medication Sig   acetaminophen (TYLENOL) 325 MG tablet Take 2 tablets (650 mg total) by mouth every 4 (four) hours as needed for headache or mild pain. (Patient taking differently: Take 325 mg by mouth 2 (two) times daily as needed for headache, mild pain (pain score 1-3) or moderate pain (pain score 4-6).)   albuterol (PROVENTIL) (2.5 MG/3ML) 0.083% nebulizer solution TAKE 3 MLS BY NEBULIZATION EVERY 4 HOURS AS NEEDED FOR WHEEZING OR SHORTNESS OF BREATH (((PLAN B)))   albuterol (VENTOLIN HFA) 108 (90 Base) MCG/ACT inhaler Inhale 2 puffs into the lungs every 4 hours as needed for shortness of breath or wheezing   ASPIRIN 81 PO Take 81 mg by mouth daily.   Budeson-Glycopyrrol-Formoterol (BREZTRI AEROSPHERE) 160-9-4.8 MCG/ACT AERO Inhale 2 puffs into the lungs in the morning and at bedtime.   busPIRone (BUSPAR) 10 MG tablet Take 1 tablet (10 mg total) by mouth 3 (three) times daily. (Patient taking differently: Take 10 mg by mouth 2 (two) times daily as needed (anxiety).)   Cholecalciferol (VITAMIN D PO) Take 1 tablet by mouth daily.   dextromethorphan-guaiFENesin (MUCINEX DM) 30-600 MG 12hr tablet Take 1 tablet by mouth 2 (two) times daily.   doxycycline (VIBRAMYCIN) 100 MG capsule Take 1 capsule (100 mg total) by mouth 2 (two) times daily. (Patient taking differently: Take 100 mg by mouth as needed.)   Dupilumab (DUPIXENT) 300 MG/2ML SOAJ Inject 300 mg into the skin every 14 (fourteen) days. **loading dose completed in clinic on 12/15/22**   ezetimibe (ZETIA) 10 MG tablet Take 1 tablet (10 mg total) by mouth daily.   famotidine (PEPCID) 20 MG tablet Take 20-40 mg by mouth daily as needed for heartburn or indigestion.   furosemide (LASIX) 40 MG tablet Take 40 mg daily   gabapentin (NEURONTIN) 300 MG capsule take  1 capsule by mouth in the morning, then take 1 at lunch and 2 at bedtime (Patient taking differently: Take 300 mg by mouth daily as needed (Nerve pain).)   glucose blood (TRUE METRIX BLOOD GLUCOSE TEST) test strip Use as instructed   ipratropium (ATROVENT) 0.06 % nasal spray Place 2 sprays into both nostrils 3 (three) times daily. As needed for nasal congestion, runny nose   ipratropium-albuterol (DUONEB) 0.5-2.5 (3) MG/3ML SOLN INHALE 1 VIAL VIA NEBULIZER TWICE A DAY   MAGnesium-Oxide 400 (240 Mg) MG tablet Take 1 tablet (400 mg total) by mouth daily.   metFORMIN (GLUCOPHAGE-XR) 500 MG 24 hr tablet Take 2 tablets (1,000 mg total) by mouth in the morning AND 1 tablet (500 mg total) every evening with meals.   metoprolol succinate (TOPROL-XL) 100 MG 24  hr tablet Take 1 tablet (100 mg total) by mouth daily. Take with or immediately following a meal. (Patient taking differently: Take 100 mg by mouth daily. Take with 50 mg tablet to equal 150 mg)   metoprolol succinate (TOPROL-XL) 50 MG 24 hr tablet Take 1 tablet (50 mg total) by mouth daily. Take with or immediately following a meal. (Patient taking differently: Take 50 mg by mouth daily. Take with 100 mg tablet to equal 150 mg)   montelukast (SINGULAIR) 10 MG tablet Take 1 tablet (10 mg total) by mouth at bedtime. (Patient taking differently: Take 10 mg by mouth at bedtime as needed (allergies).)   nicotine (NICODERM CQ - DOSED IN MG/24 HR) 7 mg/24hr patch Place 1 patch (7 mg total) onto the skin daily.   pantoprazole (PROTONIX) 40 MG tablet Take 1 tablet (40 mg total) by mouth 2 (two) times daily.   potassium chloride SA (KLOR-CON M) 20 MEQ tablet Take 1 tablet (20 mEq total) by mouth daily.   predniSONE (DELTASONE) 10 MG tablet Take 30 mg by mouth daily as needed.   rosuvastatin (CRESTOR) 20 MG tablet Take 1 tablet (20 mg total) by mouth daily.   silver sulfADIAZINE (SILVADENE) 1 % cream Apply twice a day to affected area (Patient taking differently:  Apply 1 Application topically as needed (rash).)   tiZANidine (ZANAFLEX) 4 MG tablet Take 4 mg by mouth 3 (three) times daily as needed for muscle spasms.   traMADol (ULTRAM) 50 MG tablet Take 1 tablet (50 mg total) by mouth every evening. Each prescription to last 1 mth (Patient taking differently: Take 50 mg by mouth 3 (three) times daily as needed for moderate pain (pain score 4-6) or severe pain (pain score 7-10).)   TRUEplus Lancets 28G MISC Use as directed   valsartan (DIOVAN) 320 MG tablet Take 1 tablet (320 mg total) by mouth daily.   [DISCONTINUED] amLODipine (NORVASC) 5 MG tablet Take 1 tablet (5 mg total) by mouth in the morning and at bedtime.     Allergies:   Lisinopril and Molds & smuts   Social History   Socioeconomic History   Marital status: Single    Spouse name: Not on file   Number of children: 2   Years of education: 12 grade   Highest education level: Not on file  Occupational History   Occupation: Facilities manager: [employer]  Tobacco Use   Smoking status: Every Day    Current packs/day: 0.75    Average packs/day: 0.8 packs/day for 49.2 years (36.9 ttl pk-yrs)    Types: Cigarettes    Start date: 1976   Smokeless tobacco: Never   Tobacco comments:    still smoking 0.5 ppd  Vaping Use   Vaping status: Never Used  Substance and Sexual Activity   Alcohol use: Not Currently    Alcohol/week: 3.0 standard drinks of alcohol    Types: 3 Cans of beer per week   Drug use: No   Sexual activity: Not on file  Other Topics Concern   Not on file  Social History Narrative   Not on file   Social Drivers of Health   Financial Resource Strain: Not on file  Food Insecurity: No Food Insecurity (06/20/2023)   Hunger Vital Sign    Worried About Running Out of Food in the Last Year: Never true    Ran Out of Food in the Last Year: Never true  Transportation Needs: No Transportation Needs (06/20/2023)  PRAPARE - Administrator, Civil Service (Medical): No     Lack of Transportation (Non-Medical): No  Physical Activity: Not on file  Stress: No Stress Concern Present (05/26/2020)   [employer] of Occupational Health - Occupational Stress Questionnaire    Feeling of Stress : Only a little  Social Connections: Unknown (06/20/2023)   Social Connection and Isolation Panel [NHANES]    Frequency of Communication with Friends and Family: Three times a week    Frequency of Social Gatherings with Friends and Family: Twice a week    Attends Religious Services: Patient declined    Database administrator or Organizations: No    Attends Engineer, structural: Never    Marital Status: Never married     Family History: The patient's family history includes Allergies in his brother; Asthma in his mother; Colon cancer in his father; Diabetes in his maternal grandmother; Lung cancer in his father and mother; Prostate cancer in his father. There is no history of Colon polyps, Esophageal cancer, Stomach cancer, or Rectal cancer.  ROS:   Please see the history of present illness.     All other systems reviewed and are negative.  EKGs/Labs/Other Studies Reviewed:    The following studies were reviewed today:   EKG:   07/05/23: Normal sinus rhythm, no PVCs, poor R wave progression, rate 93 09/24/21: Normal sinus rhythm, no PVCs, poor R wave progression 05/05/21: Normal sinus rhythm, PVCs, Q waves in V1/2 04/14/20: normal sinus rhythm, PVCs, no ST abnormalities 05/18/20: EKG was not ordered. 07/14/20: EKG was not ordered. 09/17/20: Sinus rhythm. Rate 96 bpm PVCs, No ST abnormalities. 03/25/21: Normal sinus rhythm, frequent PVCs (trigeminy)  Recent Labs: 07/02/2023: B Natriuretic Peptide 97.8; Hemoglobin 13.6; Platelets 352 07/05/2023: ALT 57; BUN 17; Creatinine, Ser 1.12; Magnesium 1.6; Potassium 4.2; Sodium 139  Recent Lipid Panel    Component Value Date/Time   CHOL 135 08/08/2022 0838   TRIG 242 (H) 08/08/2022 0838   HDL 59 08/08/2022 0838    CHOLHDL 2.3 08/08/2022 0838   LDLCALC 39 08/08/2022 0838    Physical Exam:    VS:  BP 120/60 (BP Location: Right Arm, Patient Position: Sitting, Cuff Size: Normal)   Pulse 95   Ht 5\' 10"  (1.778 m)   Wt 235 lb (106.6 kg)   BMI 33.72 kg/m     Wt Readings from Last 3 Encounters:  07/12/23 235 lb (106.6 kg)  07/05/23 237 lb 6.4 oz (107.7 kg)  07/02/23 229 lb 4.5 oz (104 kg)     GEN:  in no acute distress HEENT: Normal NECK: No JVD CARDIAC: RRR, no murmurs, rubs, gallops RESPIRATORY: CTAB ABDOMEN: Soft, non-tender, non-distended MUSCULOSKELETAL: 2+ BLE edema; No deformity  SKIN: Warm and dry NEUROLOGIC:  Alert and oriented x 3 PSYCHIATRIC:  Normal affect   ASSESSMENT:    1. Hyperlipidemia, unspecified hyperlipidemia type   2. Essential hypertension   3. Chest pain of uncertain etiology       PLAN:    HCM: Cardiac MRI 06/23/2020 showed asymmetric hypertrophy measuring up to 20 mm and basal anterior septum (8 mm and posterior wall), consistent with hypertrophic cardiomyopathy.  Also with patchy LGE in basal septum and RV insertion sites consistent with HCM; LGE accounts for 11% of total myocardial mass.  Normal LV/RV size and systolic function.  Echocardiogram on 03/18/2020 showed mild LVOT obstruction, peak gradient 13 mmHg.  Echocardiogram 08/2022 showed hyperdynamic LV function with near mid cavity obliteration during systole with  gradient up to 32 mmHg with Valsalva. -Recommended first-degree relatives be screened for HCM: He reports he has talked to his daughters and brother/sisters.  Discussed genetic testing but he declined -Ideally try to avoid diuretics in HCM but he is volume overloaded on exam.  Continue Lasix 40 mg daily.  Check BMET, magnesium -Continue Toprol-XL 150 mg daily -NSVT, frequent PVCs on Zio patch.  Referred to EP for evaluation, underwent PVC ablation with Dr Elberta Fortis on 08/10/21 -Update echocardiogram to evaluate for worsening LVOT obstruction since  starting Lasix  Lower extremity edema: Bilateral swelling in feet/ankles.  Normal BNP, CMET 07/2022.  Suspect related to amlodipine use, dose reduced to 5 mg daily.   -Now with worsening lower extremity edema, reports he has been taking amlodipine 5 mg twice daily.  Will reduce amlodipine dose back to 5 mg daily.  He has been started on Lasix 40 mg daily, will continue. Check BMET, magnesium.  Encouraged use of compression stockings  Chest pain/DOE: Chest pain atypical in description but does have CAD risk factors (hypertension, hyperlipidemia, T2DM).   Lexiscan Myoview on 04/29/2020 showed normal perfusion, EF 57%.  Calcium score 117 on 04/29/2020 (81st percentile). -He is reporting worsening chest pain recently, atypical in description.  Coronary CTA was ordered but was canceled due to elevated heart rate.  Recommend stress PET for further evaluation  PVCs: [employer] monitor x3 days on 05/06/2020 showed 7 episodes of NSVT, longest lasting 3 beats, occasional PVCs (3.9% of beats).  Zio patch x3 days on 04/06/2021 showed frequent PVCs (14.3% of beats), 1 episode of NSVT x4 beats -Referred to EP for evaluation, underwent PVC ablation with Dr. Elberta Fortis 08/10/2021.  Significantly improved, repeat Zio patch x3 days on 10/07/2021 showed PVCs represent 1.6% of beats. -Zio patch x 2 weeks 03/2023 showed more frequent PVCs (7.7% of beats) and 7 episodes of NSVT with longest lasting 12 beats, Toprol-XL dose increased to 150 mg daily  Leg pain: Normal ABIs on 04/23/2019  Hypertension: On amlodipine 5 mg twice daily, valsartan 320 mg, Toprol-XL 150 mg daily.  Appears controlled.  Decrease amlodipine to 5 mg daily given lower extremity edema as above.  Asked him to check BP twice daily for next week and let us know results  Tobacco use: Patient counseled on the risk of tobacco use and cessation strongly encouraged.  Hyperlipidemia: On rosuvastatin 40 mg daily and Zetia 10 mg daily, LDL 53 on 03/25/2021.  Calcium  score 117 on 04/29/2020 (81st percentile).  LDL 24 08/22/2021, rosuvastatin decreased to 20 mg daily.  LDL 39 on 08/08/2022  T2DM: On Metformin.  A1c 7.3% 06/19/33  OSA: Sleep study 10/2020 showed severe OSA, he is now on CPAP and reports compliance  RTC in 2 months  Informed Consent   Shared Decision Making/Informed Consent The risks [chest pain, shortness of breath, cardiac arrhythmias, dizziness, blood pressure fluctuations, myocardial infarction, stroke/transient ischemic attack, nausea, vomiting, allergic reaction, radiation exposure, metallic taste sensation and life-threatening complications (estimated to be 1 in 10,000)], benefits (risk stratification, diagnosing coronary artery disease, treatment guidance) and alternatives of a cardiac PET stress test were discussed in detail with Mr. Kuck and he agrees to proceed.      Medication Adjustments/Labs and Tests Ordered: Current medicines are reviewed at length with the patient today.  Concerns regarding medicines are outlined above.  Orders Placed This Encounter  Procedures   NM PET CT CARDIAC PERFUSION MULTI W/ABSOLUTE BLOODFLOW   Magnesium   Basic metabolic panel   Cardiac Stress Test:  Informed Consent Details: Physician/Practitioner Attestation; Transcribe to consent form and obtain patient signature    Meds ordered this encounter  Medications   amLODipine (NORVASC) 5 MG tablet    Sig: Take 1 tablet (5 mg total) by mouth daily.    Dispense:  90 tablet    Refill:  3    Dose change     Patient Instructions  Medication Instructions:  Decrease amlodipine dose to 5 mg daily.  *If you need a refill on your cardiac medications before your next appointment, please call your pharmacy*   Lab Work: BMET, Magnesium today. If you have labs (blood work) drawn today and your tests are completely normal, you will receive your results only by: MyChart Message (if you have MyChart) OR A paper copy in the mail If you have any lab test  that is abnormal or we need to change your treatment, we will call you to review the results.   Testing/Procedures:    Please report to Radiology at the [facility] Main Entrance 30 minutes early for your test.  [address] [place], [place] [address]                       How to Prepare for Your Cardiac PET/CT Stress Test:  Nothing to eat or drink, except water, 3 hours prior to arrival time.  NO caffeine/decaffeinated products, or chocolate 12 hours prior to arrival. (Please note decaffeinated beverages (teas/coffees) still contain caffeine).  If you have caffeine within 12 hours prior, the test will need to be rescheduled.  Medication instructions: Do not take erectile dysfunction medications for 72 hours prior to test (sildenafil, tadalafil) Do not take nitrates (isosorbide mononitrate, Ranexa) the day before or day of test Do not take tamsulosin the day before or morning of test Hold theophylline containing medications for 12 hours. Hold Dipyridamole 48 hours prior to the test.  Diabetic Preparation: If able to eat breakfast prior to 3 hour fasting, you may take all medications, including your insulin. Do not worry if you miss your breakfast dose of insulin - start at your next meal. If you do not eat prior to 3 hour fast-Hold all diabetes (oral and insulin) medications. Patients who wear a continuous glucose monitor MUST remove the device prior to scanning.  You may take your remaining medications with water.  NO perfume, cologne or lotion on chest or abdomen area. FEMALES - Please avoid wearing dresses to this appointment.  Total time is 1 to 2 hours; you may want to bring reading material for the waiting time.  IF YOU THINK YOU MAY BE PREGNANT, OR ARE NURSING PLEASE INFORM THE TECHNOLOGIST.  In preparation for your appointment, medication and supplies will be purchased.  Appointment availability is limited, so if you need to cancel or reschedule, please call  the Radiology Department Scheduler at [phone] 24 hours in advance to avoid a cancellation fee of $100.00  What to Expect When you Arrive:  Once you arrive and check in for your appointment, you will be taken to a preparation room within the Radiology Department.  A technologist or Nurse will obtain your medical history, verify that you are correctly prepped for the exam, and explain the procedure.  Afterwards, an IV will be started in your arm and electrodes will be placed on your skin for EKG monitoring during the stress portion of the exam. Then you will be escorted to the PET/CT scanner.  There, staff will get  you positioned on the scanner and obtain a blood pressure and EKG.  During the exam, you will continue to be connected to the EKG and blood pressure machines.  A small, safe amount of a radioactive tracer will be injected in your IV to obtain a series of pictures of your heart along with an injection of a stress agent.    After your Exam:  It is recommended that you eat a meal and drink a caffeinated beverage to counter act any effects of the stress agent.  Drink plenty of fluids for the remainder of the day and urinate frequently for the first couple of hours after the exam.  Your doctor will inform you of your test results within 7-10 business days.  For more information and frequently asked questions, please visit our website: [email or web address]  For questions about your test or how to prepare for your test, please call: Cardiac Imaging Nurse Navigators Office: [phone] pet   Follow-Up: At [facility], you and your health needs are our priority.  As part of our continuing mission to provide you with exceptional heart care, we have created designated Provider Care Teams.  These Care Teams include your primary Cardiologist (physician) and Advanced Practice Providers (APPs -  Physician Assistants and Nurse Practitioners) who all work together to provide  you with the care you need, when you need it.  We recommend signing up for the patient portal called "MyChart".  Sign up information is provided on this After Visit Summary.  MyChart is used to connect with patients for Virtual Visits (Telemedicine).  Patients are able to view lab/test results, encounter notes, upcoming appointments, etc.  Non-urgent messages can be sent to your provider as well.   To learn more about what you can do with MyChart, go to [email or web address].    Your next appointment:    Already scheduled for May 2025  Provider:   [name], MD     Other Instructions Take and record blood pressure 2 times per day for one week and send recording of results to Dr Bjorn Pippin to review via MyChart message.          Signed, [name], MD  07/12/2023 11:21 AM    [facility]

## 2023-07-12 ENCOUNTER — Ambulatory Visit: Payer: Medicare PPO | Attending: Cardiology | Admitting: Cardiology

## 2023-07-12 ENCOUNTER — Encounter: Payer: Self-pay | Admitting: Cardiology

## 2023-07-12 VITALS — BP 120/60 | HR 95 | Ht 70.0 in | Wt 235.0 lb

## 2023-07-12 DIAGNOSIS — E785 Hyperlipidemia, unspecified: Secondary | ICD-10-CM

## 2023-07-12 DIAGNOSIS — R079 Chest pain, unspecified: Secondary | ICD-10-CM | POA: Diagnosis not present

## 2023-07-12 DIAGNOSIS — I10 Essential (primary) hypertension: Secondary | ICD-10-CM | POA: Diagnosis not present

## 2023-07-12 LAB — BASIC METABOLIC PANEL
BUN/Creatinine Ratio: 12 (ref 10–24)
BUN: 13 mg/dL (ref 8–27)
CO2: 24 mmol/L (ref 20–29)
Calcium: 9.2 mg/dL (ref 8.6–10.2)
Chloride: 99 mmol/L (ref 96–106)
Creatinine, Ser: 1.09 mg/dL (ref 0.76–1.27)
Glucose: 118 mg/dL — ABNORMAL HIGH (ref 70–99)
Potassium: 4.4 mmol/L (ref 3.5–5.2)
Sodium: 141 mmol/L (ref 134–144)
eGFR: 75 mL/min/{1.73_m2} (ref >59)

## 2023-07-12 LAB — MAGNESIUM: Magnesium: 2 mg/dL (ref 1.6–2.3)

## 2023-07-12 MED ORDER — AMLODIPINE BESYLATE 5 MG PO TABS: 5.0000 mg | ORAL_TABLET | Freq: Every day | ORAL | 3 refills | Status: DC

## 2023-07-12 NOTE — Patient Instructions (Signed)
 Medication Instructions:  Decrease amlodipine dose to 5 mg daily.  *If you need a refill on your cardiac medications before your next appointment, please call your pharmacy*   Lab Work: BMET, Magnesium today. If you have labs (blood work) drawn today and your tests are completely normal, you will receive your results only by: MyChart Message (if you have MyChart) OR A paper copy in the mail If you have any lab test that is abnormal or we need to change your treatment, we will call you to review the results.   Testing/Procedures:    Please report to Radiology at the [facility] Main Entrance 30 minutes early for your test.  [address] [place], [place] [address]                       How to Prepare for Your Cardiac PET/CT Stress Test:  Nothing to eat or drink, except water, 3 hours prior to arrival time.  NO caffeine/decaffeinated products, or chocolate 12 hours prior to arrival. (Please note decaffeinated beverages (teas/coffees) still contain caffeine).  If you have caffeine within 12 hours prior, the test will need to be rescheduled.  Medication instructions: Do not take erectile dysfunction medications for 72 hours prior to test (sildenafil, tadalafil) Do not take nitrates (isosorbide mononitrate, Ranexa) the day before or day of test Do not take tamsulosin the day before or morning of test Hold theophylline containing medications for 12 hours. Hold Dipyridamole 48 hours prior to the test.  Diabetic Preparation: If able to eat breakfast prior to 3 hour fasting, you may take all medications, including your insulin. Do not worry if you miss your breakfast dose of insulin - start at your next meal. If you do not eat prior to 3 hour fast-Hold all diabetes (oral and insulin) medications. Patients who wear a continuous glucose monitor MUST remove the device prior to scanning.  You may take your remaining medications with water.  NO perfume, cologne or lotion on  chest or abdomen area. FEMALES - Please avoid wearing dresses to this appointment.  Total time is 1 to 2 hours; you may want to bring reading material for the waiting time.  IF YOU THINK YOU MAY BE PREGNANT, OR ARE NURSING PLEASE INFORM THE TECHNOLOGIST.  In preparation for your appointment, medication and supplies will be purchased.  Appointment availability is limited, so if you need to cancel or reschedule, please call the Radiology Department Scheduler at [phone] 24 hours in advance to avoid a cancellation fee of $100.00  What to Expect When you Arrive:  Once you arrive and check in for your appointment, you will be taken to a preparation room within the Radiology Department.  A technologist or Nurse will obtain your medical history, verify that you are correctly prepped for the exam, and explain the procedure.  Afterwards, an IV will be started in your arm and electrodes will be placed on your skin for EKG monitoring during the stress portion of the exam. Then you will be escorted to the PET/CT scanner.  There, staff will get you positioned on the scanner and obtain a blood pressure and EKG.  During the exam, you will continue to be connected to the EKG and blood pressure machines.  A small, safe amount of a radioactive tracer will be injected in your IV to obtain a series of pictures of your heart along with an injection of a stress agent.    After your Exam:  It is recommended that you eat a meal and drink a caffeinated beverage to counter act any effects of the stress agent.  Drink plenty of fluids for the remainder of the day and urinate frequently for the first couple of hours after the exam.  Your doctor will inform you of your test results within 7-10 business days.  For more information and frequently asked questions, please visit our website: [email or web address]  For questions about your test or how to prepare for your test, please call: Cardiac Imaging Nurse  [facility] Office: [phone] pet   Follow-Up: At [facility], you and your health needs are our priority.  As part of our continuing mission to provide you with exceptional heart care, we have created designated Provider Care Teams.  These Care Teams include your primary Cardiologist (physician) and Advanced Practice Providers (APPs -  Physician Assistants and Nurse Practitioners) who all work together to provide you with the care you need, when you need it.  We recommend signing up for the patient portal called "MyChart".  Sign up information is provided on this After Visit Summary.  MyChart is used to connect with patients for Virtual Visits (Telemedicine).  Patients are able to view lab/test results, encounter notes, upcoming appointments, etc.  Non-urgent messages can be sent to your provider as well.   To learn more about what you can do with MyChart, go to [email or web address].    Your next appointment:    Already scheduled for May 2025  Provider:   [name], MD     Other Instructions Take and record blood pressure 2 times per day for one week and send recording of results to Dr Bjorn Pippin to review via MyChart message.

## 2023-07-14 ENCOUNTER — Other Ambulatory Visit: Payer: Self-pay | Admitting: Internal Medicine

## 2023-07-14 DIAGNOSIS — T2124XA Burn of second degree of lower back, initial encounter: Secondary | ICD-10-CM

## 2023-07-14 NOTE — Telephone Encounter (Signed)
 Patient called and asked if he's a patient at [facility] or is Joseph Martes, NP his PCP. He says Joseph Harvey is his PCP. Advised we received a refill request for Silvadene and if he needs it to reach out to his PCP office for refills. He verbalized understanding.

## 2023-07-18 ENCOUNTER — Other Ambulatory Visit: Payer: Self-pay | Admitting: Orthopaedic Surgery

## 2023-07-18 ENCOUNTER — Telehealth: Payer: Self-pay | Admitting: Pulmonary Disease

## 2023-07-18 DIAGNOSIS — M546 Pain in thoracic spine: Secondary | ICD-10-CM

## 2023-07-18 NOTE — Telephone Encounter (Signed)
 Called and spoke with patient.  Patient states his DME company ([employer]) told him he needs a walk test before they can certify any further oxygen tanks.  Patient states [employer] informed him they will come this week and pick up his oxygen supplies if he does not get his walk test this week.  He stated the front desk said we do not have any future appointments until April 2025. Informed patient I will call [employer] and ask what patient needs for patient to keep his oxygen and supplies and call patient back.  Tried to call [employer].  No answer.  Will assume they are closed for the day.  Will try to call tomorrow.  Notified patient [employer] was closed and we will attempt to call [employer] tomorrow to get information.  Patient verbalized understanding.

## 2023-07-18 NOTE — Telephone Encounter (Signed)
 Patient states needs to requalified for oxygen. Patient uses [employer]. Patient phone number is [phone].

## 2023-07-19 ENCOUNTER — Telehealth: Payer: Self-pay | Admitting: Pulmonary Disease

## 2023-07-19 ENCOUNTER — Telehealth: Payer: Self-pay

## 2023-07-19 ENCOUNTER — Ambulatory Visit: Admitting: Primary Care

## 2023-07-19 NOTE — Telephone Encounter (Signed)
 Overnight oximetry reviewed.  The test was requested performed on room air.  There were no significant desaturations, lowest oxygen 89% briefly.  Mostly in the mid 90s.

## 2023-07-19 NOTE — Telephone Encounter (Signed)
 Joseph Harvey called [employer] regarding re qualifying walk as he was scheduled for an office visit today.  He was walked on 2/11 when he saw Dr. Judeth Horn, there is walk information, an office visit note and order for oxygen.  The patient already has the oxygen.  Nothing further needed.

## 2023-07-19 NOTE — Telephone Encounter (Signed)
 I called and spoke to pt. Pts appointment with Buelah Manis, NP is cancelled today due to the pt just needed his walk and lov notes sent to [employer]. Pt did not need to come in today due to his already being walked and re certified in February with Dr Judeth Horn. I will contact [employer] to see what they are needing and have this faxed to them.

## 2023-07-24 IMAGING — CR DG CHEST 2V
2 series · 2 of 2 positions shown · non-contrast
Comparison: March 10, 2021

CLINICAL DATA: Shortness of breath.  PVC ablation July 2021.

EXAM:
CHEST - 2 VIEW

[w chest pa]
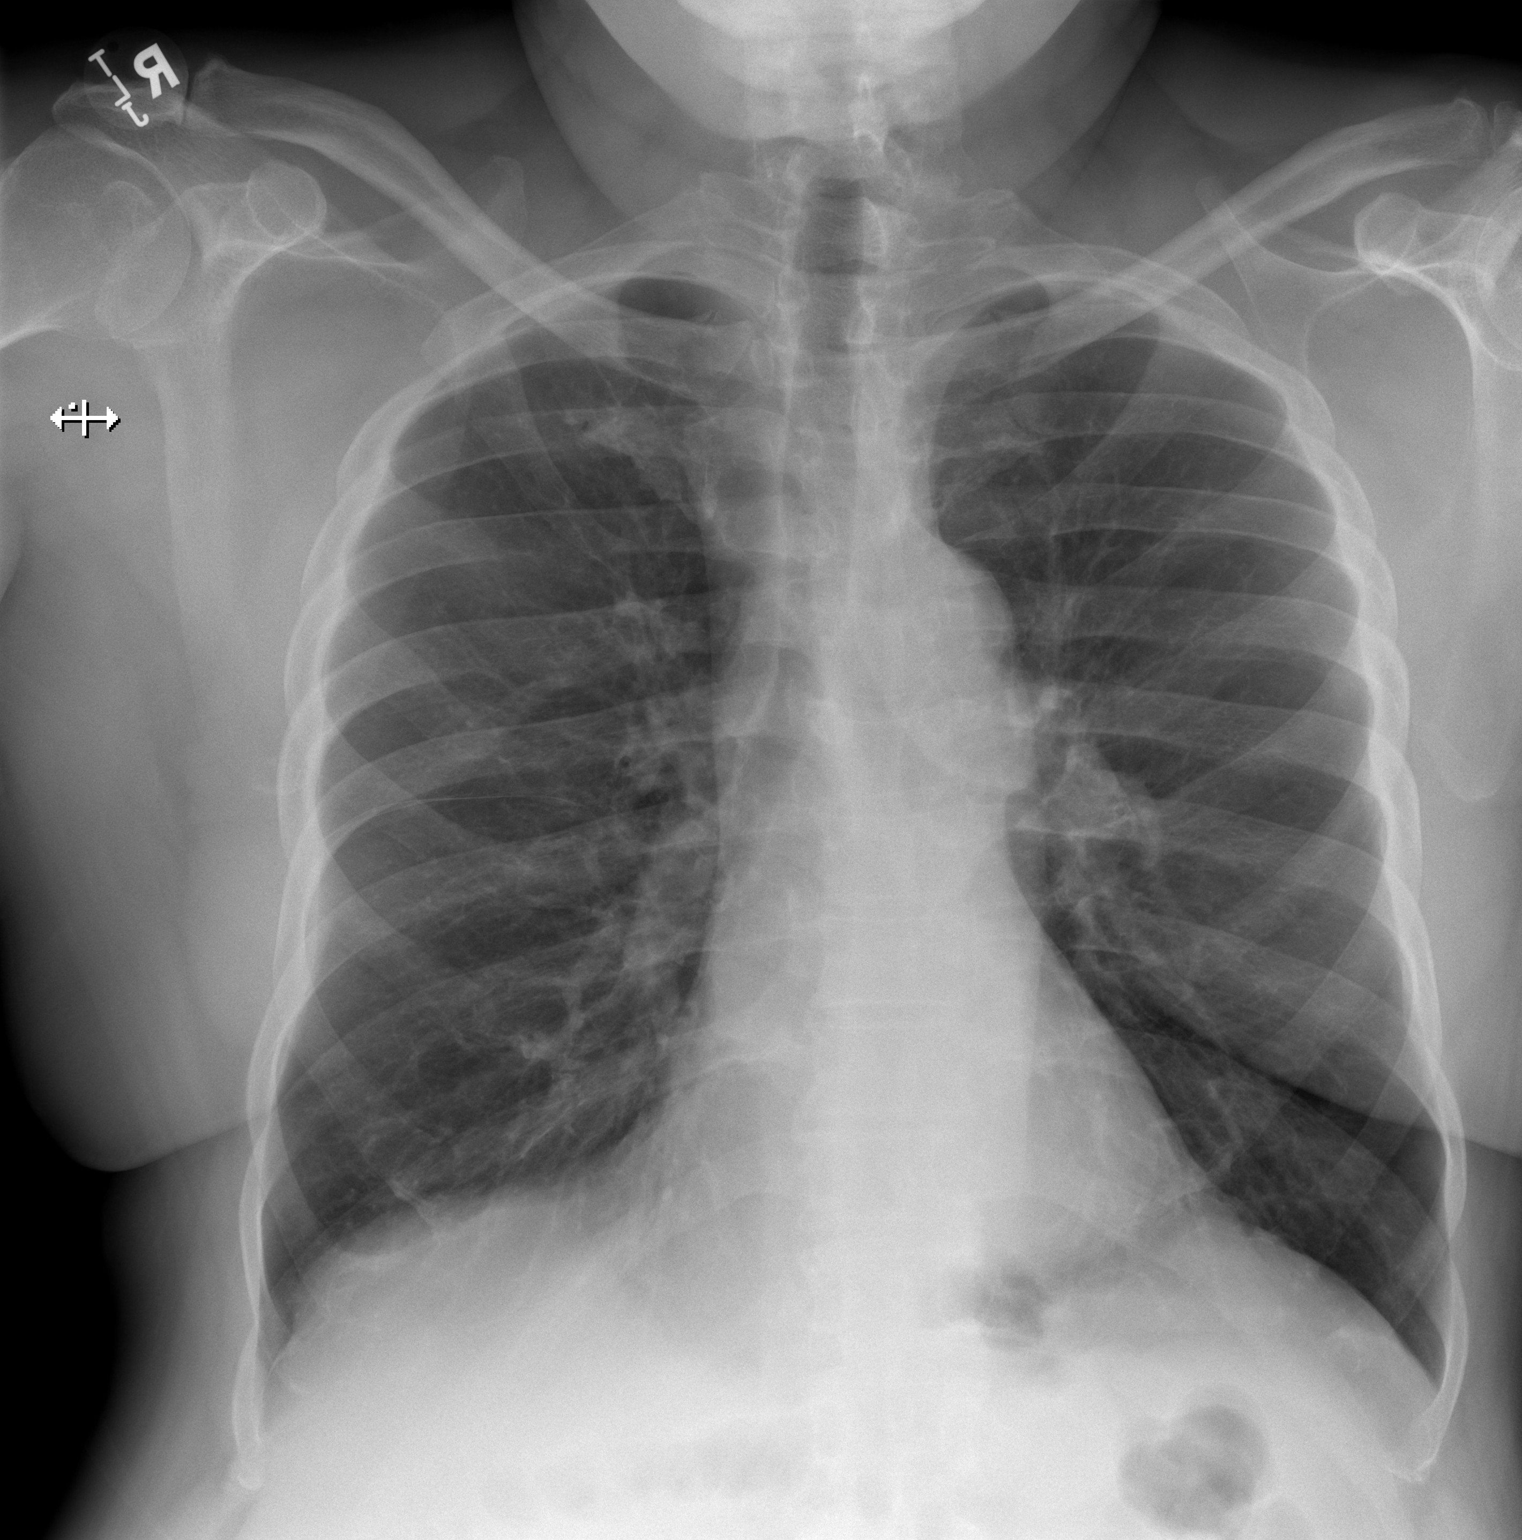

[w chest lat]
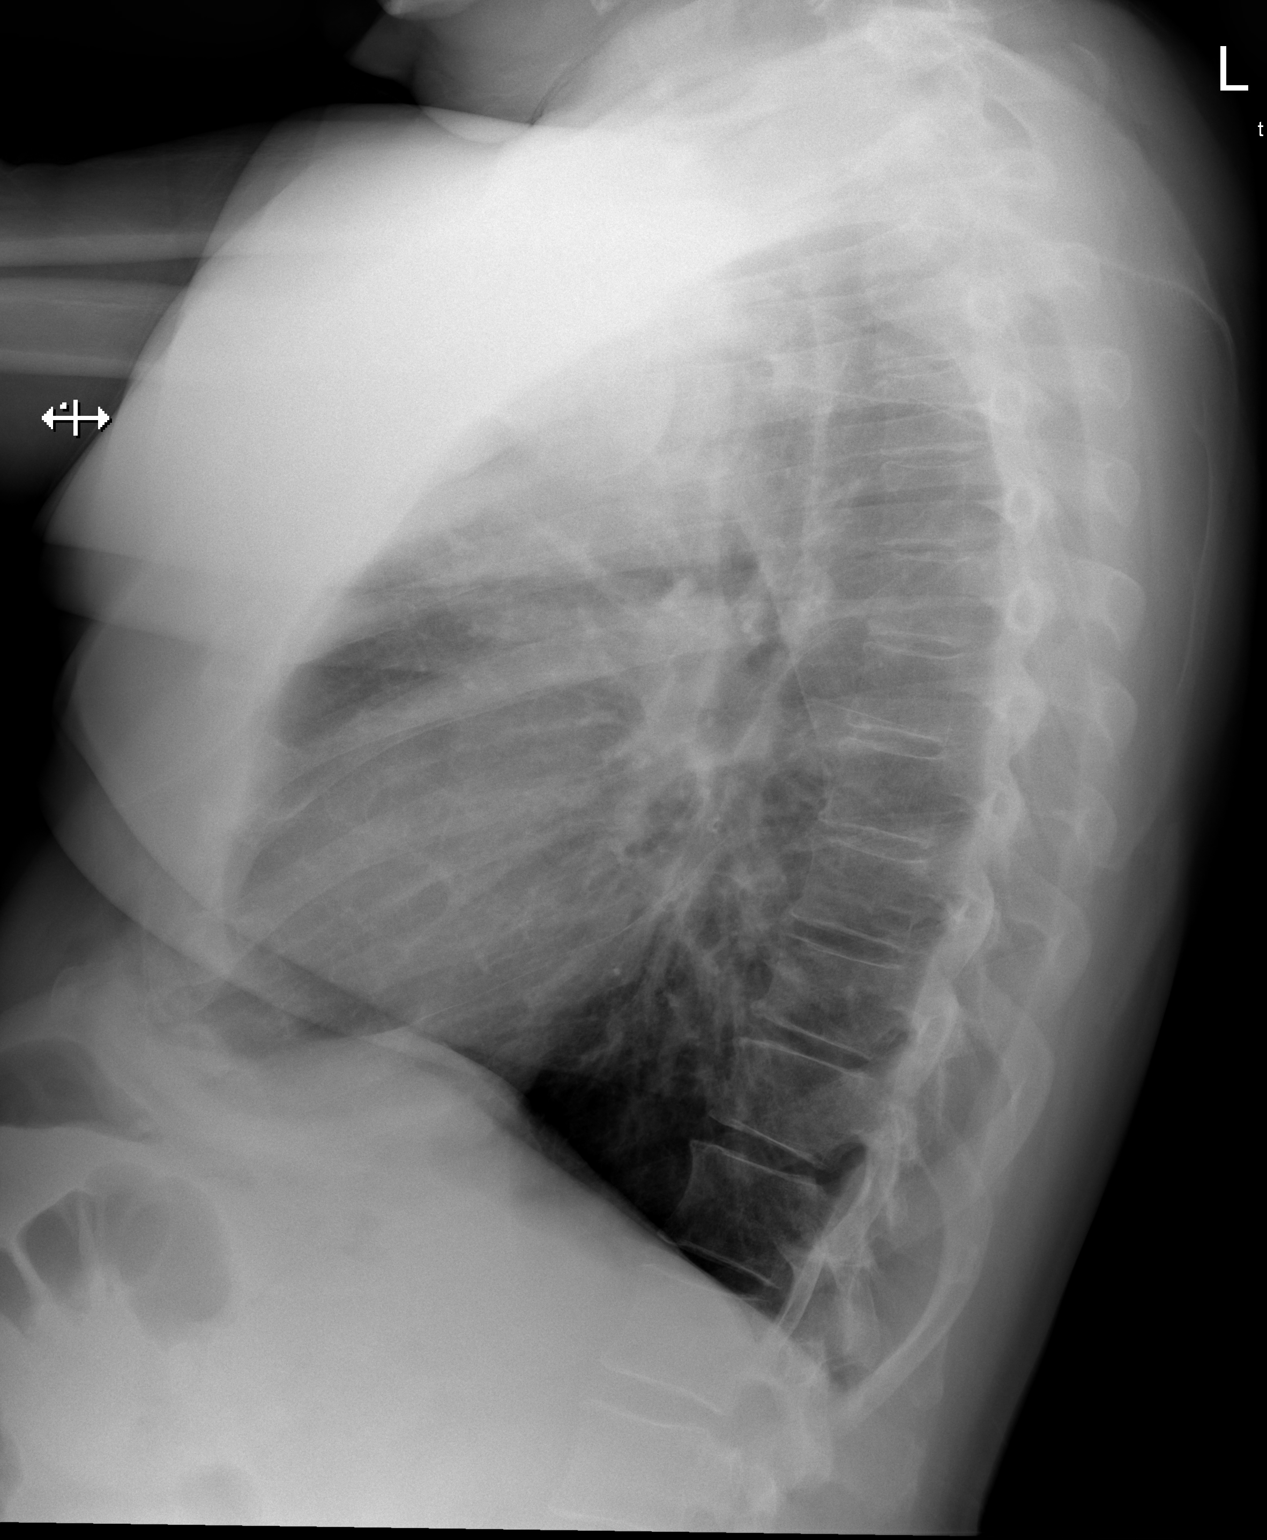

[2 of 2 positions shown; findings below may reference images not displayed]

FINDINGS: The heart size and mediastinal contours are within normal limits.
Both lungs are clear. The visualized skeletal structures are
unremarkable.
IMPRESSION: No active cardiopulmonary disease.

## 2023-07-26 ENCOUNTER — Other Ambulatory Visit: Payer: Self-pay

## 2023-07-27 ENCOUNTER — Other Ambulatory Visit: Payer: Self-pay

## 2023-07-28 ENCOUNTER — Encounter: Payer: Self-pay | Admitting: Pulmonary Disease

## 2023-08-04 ENCOUNTER — Encounter: Payer: Self-pay | Admitting: Orthopaedic Surgery

## 2023-08-04 ENCOUNTER — Ambulatory Visit (HOSPITAL_COMMUNITY): Payer: Medicare PPO | Attending: Cardiology

## 2023-08-04 DIAGNOSIS — I422 Other hypertrophic cardiomyopathy: Secondary | ICD-10-CM | POA: Insufficient documentation

## 2023-08-04 LAB — ECHOCARDIOGRAM COMPLETE
Area-P 1/2: 5.01 cm2
Est EF: >75

## 2023-08-04 MED ORDER — PERFLUTREN LIPID MICROSPHERE
1.0000 mL | INTRAVENOUS | Status: AC | PRN
  Administered 2023-08-04: 2 mL via INTRAVENOUS

## 2023-08-08 ENCOUNTER — Ambulatory Visit
Admission: RE | Admit: 2023-08-08 | Discharge: 2023-08-08 | Disposition: A | Discharge status: Home / Self Care | Source: Ambulatory Visit | Attending: Orthopaedic Surgery | Admitting: Orthopaedic Surgery

## 2023-08-08 DIAGNOSIS — M546 Pain in thoracic spine: Secondary | ICD-10-CM

## 2023-08-09 ENCOUNTER — Encounter: Payer: Self-pay | Admitting: *Deleted

## 2023-08-19 ENCOUNTER — Other Ambulatory Visit: Payer: Self-pay | Admitting: Cardiology

## 2023-09-06 ENCOUNTER — Encounter (HOSPITAL_BASED_OUTPATIENT_CLINIC_OR_DEPARTMENT_OTHER): Payer: Self-pay | Admitting: Emergency Medicine

## 2023-09-06 ENCOUNTER — Inpatient Hospital Stay (HOSPITAL_BASED_OUTPATIENT_CLINIC_OR_DEPARTMENT_OTHER)
Admission: EM | Admit: 2023-09-06 | Discharge: 2023-09-14 | DRG: 291 | Disposition: A | Discharge status: Home / Self Care | Attending: Internal Medicine | Admitting: Internal Medicine

## 2023-09-06 ENCOUNTER — Emergency Department (HOSPITAL_BASED_OUTPATIENT_CLINIC_OR_DEPARTMENT_OTHER)

## 2023-09-06 ENCOUNTER — Telehealth: Payer: Self-pay | Admitting: Cardiology

## 2023-09-06 ENCOUNTER — Other Ambulatory Visit: Payer: Self-pay

## 2023-09-06 DIAGNOSIS — F419 Anxiety disorder, unspecified: Secondary | ICD-10-CM | POA: Diagnosis present

## 2023-09-06 DIAGNOSIS — I509 Heart failure, unspecified: Principal | ICD-10-CM

## 2023-09-06 DIAGNOSIS — I5031 Acute diastolic (congestive) heart failure: Secondary | ICD-10-CM

## 2023-09-06 DIAGNOSIS — L03116 Cellulitis of left lower limb: Secondary | ICD-10-CM | POA: Diagnosis present

## 2023-09-06 DIAGNOSIS — J4489 Other specified chronic obstructive pulmonary disease: Secondary | ICD-10-CM | POA: Diagnosis present

## 2023-09-06 DIAGNOSIS — T2124XA Burn of second degree of lower back, initial encounter: Secondary | ICD-10-CM

## 2023-09-06 DIAGNOSIS — D631 Anemia in chronic kidney disease: Secondary | ICD-10-CM | POA: Diagnosis present

## 2023-09-06 DIAGNOSIS — Z79899 Other long term (current) drug therapy: Secondary | ICD-10-CM

## 2023-09-06 DIAGNOSIS — I10 Essential (primary) hypertension: Secondary | ICD-10-CM

## 2023-09-06 DIAGNOSIS — M545 Low back pain, unspecified: Secondary | ICD-10-CM | POA: Diagnosis present

## 2023-09-06 DIAGNOSIS — I421 Obstructive hypertrophic cardiomyopathy: Secondary | ICD-10-CM | POA: Diagnosis present

## 2023-09-06 DIAGNOSIS — E782 Mixed hyperlipidemia: Secondary | ICD-10-CM

## 2023-09-06 DIAGNOSIS — I110 Hypertensive heart disease with heart failure: Secondary | ICD-10-CM | POA: Diagnosis not present

## 2023-09-06 DIAGNOSIS — I5033 Acute on chronic diastolic (congestive) heart failure: Secondary | ICD-10-CM

## 2023-09-06 DIAGNOSIS — I2510 Atherosclerotic heart disease of native coronary artery without angina pectoris: Secondary | ICD-10-CM | POA: Insufficient documentation

## 2023-09-06 DIAGNOSIS — J9811 Atelectasis: Secondary | ICD-10-CM | POA: Diagnosis present

## 2023-09-06 DIAGNOSIS — E876 Hypokalemia: Secondary | ICD-10-CM | POA: Diagnosis not present

## 2023-09-06 DIAGNOSIS — G4733 Obstructive sleep apnea (adult) (pediatric): Secondary | ICD-10-CM | POA: Diagnosis present

## 2023-09-06 DIAGNOSIS — I493 Ventricular premature depolarization: Secondary | ICD-10-CM | POA: Diagnosis present

## 2023-09-06 DIAGNOSIS — Z6837 Body mass index (BMI) 37.0-37.9, adult: Secondary | ICD-10-CM

## 2023-09-06 DIAGNOSIS — Z7984 Long term (current) use of oral hypoglycemic drugs: Secondary | ICD-10-CM

## 2023-09-06 DIAGNOSIS — E785 Hyperlipidemia, unspecified: Secondary | ICD-10-CM | POA: Diagnosis present

## 2023-09-06 DIAGNOSIS — E7800 Pure hypercholesterolemia, unspecified: Secondary | ICD-10-CM

## 2023-09-06 DIAGNOSIS — E1122 Type 2 diabetes mellitus with diabetic chronic kidney disease: Secondary | ICD-10-CM | POA: Diagnosis present

## 2023-09-06 DIAGNOSIS — E66811 Obesity, class 1: Secondary | ICD-10-CM | POA: Diagnosis present

## 2023-09-06 DIAGNOSIS — Z8546 Personal history of malignant neoplasm of prostate: Secondary | ICD-10-CM

## 2023-09-06 DIAGNOSIS — E871 Hypo-osmolality and hyponatremia: Secondary | ICD-10-CM | POA: Diagnosis not present

## 2023-09-06 DIAGNOSIS — R000 Tachycardia, unspecified: Secondary | ICD-10-CM

## 2023-09-06 DIAGNOSIS — F410 Panic disorder [episodic paroxysmal anxiety] without agoraphobia: Secondary | ICD-10-CM

## 2023-09-06 DIAGNOSIS — F17210 Nicotine dependence, cigarettes, uncomplicated: Secondary | ICD-10-CM | POA: Diagnosis present

## 2023-09-06 DIAGNOSIS — N179 Acute kidney failure, unspecified: Secondary | ICD-10-CM

## 2023-09-06 DIAGNOSIS — R0602 Shortness of breath: Secondary | ICD-10-CM

## 2023-09-06 DIAGNOSIS — G8929 Other chronic pain: Secondary | ICD-10-CM | POA: Diagnosis present

## 2023-09-06 DIAGNOSIS — E1165 Type 2 diabetes mellitus with hyperglycemia: Secondary | ICD-10-CM | POA: Diagnosis present

## 2023-09-06 DIAGNOSIS — Z7982 Long term (current) use of aspirin: Secondary | ICD-10-CM

## 2023-09-06 DIAGNOSIS — K219 Gastro-esophageal reflux disease without esophagitis: Secondary | ICD-10-CM | POA: Diagnosis present

## 2023-09-06 DIAGNOSIS — I890 Lymphedema, not elsewhere classified: Secondary | ICD-10-CM | POA: Diagnosis present

## 2023-09-06 HISTORY — DX: Heart failure, unspecified: I50.9

## 2023-09-06 LAB — COMPREHENSIVE METABOLIC PANEL WITH GFR
ALT: 36 U/L (ref 0–44)
AST: 23 U/L (ref 15–41)
Albumin: 4.4 g/dL (ref 3.5–5.0)
Alkaline Phosphatase: 86 U/L (ref 38–126)
Anion gap: 22 — ABNORMAL HIGH (ref 5–15)
BUN: 24 mg/dL — ABNORMAL HIGH (ref 8–23)
CO2: 22 mmol/L (ref 22–32)
Calcium: 10 mg/dL (ref 8.9–10.3)
Chloride: 96 mmol/L — ABNORMAL LOW (ref 98–111)
Creatinine, Ser: 1.47 mg/dL — ABNORMAL HIGH (ref 0.61–1.24)
GFR, Estimated: 52 mL/min — ABNORMAL LOW (ref >60)
Glucose, Bld: 167 mg/dL — ABNORMAL HIGH (ref 70–99)
Potassium: 4.2 mmol/L (ref 3.5–5.1)
Sodium: 139 mmol/L (ref 135–145)
Total Bilirubin: 0.5 mg/dL (ref 0.0–1.2)
Total Protein: 7 g/dL (ref 6.5–8.1)

## 2023-09-06 LAB — CBC WITH DIFFERENTIAL/PLATELET
Abs Immature Granulocytes: 0.1 10*3/uL — ABNORMAL HIGH (ref 0.00–0.07)
Basophils Absolute: 0 10*3/uL (ref 0.0–0.1)
Basophils Relative: 0 %
Eosinophils Absolute: 0 10*3/uL (ref 0.0–0.5)
Eosinophils Relative: 0 %
HCT: 36.6 % — ABNORMAL LOW (ref 39.0–52.0)
Hemoglobin: 12.7 g/dL — ABNORMAL LOW (ref 13.0–17.0)
Immature Granulocytes: 1 %
Lymphocytes Relative: 20 %
Lymphs Abs: 1.5 10*3/uL (ref 0.7–4.0)
MCH: 32.6 pg (ref 26.0–34.0)
MCHC: 34.7 g/dL (ref 30.0–36.0)
MCV: 93.8 fL (ref 80.0–100.0)
Monocytes Absolute: 0.6 10*3/uL (ref 0.1–1.0)
Monocytes Relative: 9 %
Neutro Abs: 5 10*3/uL (ref 1.7–7.7)
Neutrophils Relative %: 70 %
Platelets: 354 10*3/uL (ref 150–400)
RBC: 3.9 MIL/uL — ABNORMAL LOW (ref 4.22–5.81)
RDW: 13.5 % (ref 11.5–15.5)
WBC: 7.2 10*3/uL (ref 4.0–10.5)
nRBC: 0.3 % — ABNORMAL HIGH (ref 0.0–0.2)

## 2023-09-06 LAB — PRO BRAIN NATRIURETIC PEPTIDE: Pro Brain Natriuretic Peptide: 524 pg/mL — ABNORMAL HIGH (ref <300.0)

## 2023-09-06 LAB — TROPONIN T, HIGH SENSITIVITY: Troponin T High Sensitivity: 29 ng/L — ABNORMAL HIGH (ref <19)

## 2023-09-06 NOTE — Telephone Encounter (Signed)
 Pt returning call in regards to Mychart message. Please advise

## 2023-09-06 NOTE — Telephone Encounter (Signed)
Please see mychart encounter for further details.

## 2023-09-06 NOTE — ED Triage Notes (Addendum)
 Pt c/o BLE swelling that started tonight; chronic swelling but is worse now; pt noted to be SHOB at rest and diaphoretic

## 2023-09-06 NOTE — ED Provider Notes (Signed)
 [place] [facility] AT [facility] Provider Note   CSN: [id number] Arrival date & time: 09/06/23  2256     History {Add pertinent medical, surgical, social history, OB history to HPI:1} Chief Complaint  Patient presents with   Leg Swelling    Ezri Landers is a 68 y.o. male.  The history is provided by the patient and medical records.   Curvin Hunger is a 68 y.o. male who presents to the Emergency Department complaining of *** Hx/o afib Ble edema since feb. Taking forty daily and 20 at night No injury. Has lle pain starting today.  Diaphoresis, no fever. Has occasional chest discomfort.  Has chronic sob - feels near baseline. On 3L [place] at baseline.     Home Medications Prior to Admission medications   Medication Sig Start Date End Date Taking? Authorizing Provider  acetaminophen  (TYLENOL ) 325 MG tablet Take 2 tablets (650 mg total) by mouth every 4 (four) hours as needed for headache or mild pain. Patient taking differently: Take 325 mg by mouth 2 (two) times daily as needed for headache, mild pain (pain score 1-3) or moderate pain (pain score 4-6). 08/11/21   Tylene Galla, PA-C  albuterol  (PROVENTIL ) (2.5 MG/3ML) 0.083% nebulizer solution TAKE 3 MLS BY NEBULIZATION EVERY 4 HOURS AS NEEDED FOR WHEEZING OR SHORTNESS OF BREATH (((PLAN B))) 07/09/23   Hunsucker, Archer Kobs, MD  albuterol  (VENTOLIN  HFA) 108 (90 Base) MCG/ACT inhaler Inhale 2 puffs into the lungs every 4 hours as needed for shortness of breath or wheezing 01/11/23   Newlin, Enobong, MD  amLODipine  (NORVASC ) 5 MG tablet Take 1 tablet (5 mg total) by mouth daily. 07/12/23   Wendie Hamburg, MD  ASPIRIN  81 PO Take 81 mg by mouth daily.    [provider]  Budeson-Glycopyrrol-Formoterol  (BREZTRI  AEROSPHERE) 160-9-4.8 MCG/ACT AERO Inhale 2 puffs into the lungs in the morning and at bedtime. 02/20/23   Hunsucker, Archer Kobs, MD  busPIRone  (BUSPAR ) 10 MG tablet Take 1 tablet (10 mg  total) by mouth 3 (three) times daily. Patient taking differently: Take 10 mg by mouth 2 (two) times daily as needed (anxiety). 09/11/20   Lawrance Presume, MD  Cholecalciferol  (VITAMIN D PO) Take 1 tablet by mouth daily.    [provider]  dextromethorphan -guaiFENesin  (MUCINEX  DM) 30-600 MG 12hr tablet Take 1 tablet by mouth 2 (two) times daily. 06/24/23   Regalado, Belkys A, MD  doxycycline  (VIBRAMYCIN ) 100 MG capsule Take 1 capsule (100 mg total) by mouth 2 (two) times daily. Patient taking differently: Take 100 mg by mouth as needed. 07/02/23   Felicie Horning, PA-C  Dupilumab  (DUPIXENT ) 300 MG/2ML SOAJ Inject 300 mg into the skin every 14 (fourteen) days. **loading dose completed in clinic on 12/15/22** 05/03/23   Hunsucker, Archer Kobs, MD  ezetimibe  (ZETIA ) 10 MG tablet Take 1 tablet (10 mg total) by mouth daily. 01/11/23   Wendie Hamburg, MD  famotidine  (PEPCID ) 20 MG tablet Take 20-40 mg by mouth daily as needed for heartburn or indigestion. 05/14/23   [provider]  furosemide  (LASIX ) 40 MG tablet Take 40 mg daily 07/05/23   Wendie Hamburg, MD  gabapentin  (NEURONTIN ) 300 MG capsule take 1 capsule by mouth in the morning, then take 1 at lunch and 2 at bedtime Patient taking differently: Take 300 mg by mouth daily as needed (Nerve pain). 12/29/20     glucose blood (TRUE METRIX BLOOD GLUCOSE TEST) test strip Use as instructed 01/07/19  Lawrance Presume, MD  ipratropium (ATROVENT ) 0.06 % nasal spray Place 2 sprays into both nostrils 3 (three) times daily. As needed for nasal congestion, runny nose 06/24/23   Regalado, Belkys A, MD  ipratropium-albuterol  (DUONEB) 0.5-2.5 (3) MG/3ML SOLN INHALE 1 VIAL VIA NEBULIZER TWICE A DAY 06/24/23   Regalado, Belkys A, MD  MAGnesium -Oxide 400 (240 Mg) MG tablet Take 1 tablet (400 mg total) by mouth daily. 07/07/23   Wendie Hamburg, MD  metFORMIN  (GLUCOPHAGE -XR) 500 MG 24 hr tablet Take 2 tablets (1,000 mg total) by  mouth in the morning AND 1 tablet (500 mg total) every evening with meals. 01/24/23   Lawrance Presume, MD  metoprolol  succinate (TOPROL -XL) 100 MG 24 hr tablet Take 1 tablet (100 mg total) by mouth daily. Take with or immediately following a meal. Patient taking differently: Take 100 mg by mouth daily. Take with 50 mg tablet to equal 150 mg 05/29/23 08/27/23  Debria Fang, PA-C  metoprolol  succinate (TOPROL -XL) 50 MG 24 hr tablet Take 1 tablet (50 mg total) by mouth daily. Take with or immediately following a meal. Patient taking differently: Take 50 mg by mouth daily. Take with 100 mg tablet to equal 150 mg 05/25/23 08/23/23  Johnson, Kathleen R, PA-C  montelukast  (SINGULAIR ) 10 MG tablet Take 1 tablet (10 mg total) by mouth at bedtime. Patient taking differently: Take 10 mg by mouth at bedtime as needed (allergies). 01/11/22   Lawrance Presume, MD  nicotine  (NICODERM CQ  - DOSED IN MG/24 HR) 7 mg/24hr patch Place 1 patch (7 mg total) onto the skin daily. 06/24/23   Regalado, Belkys A, MD  pantoprazole  (PROTONIX ) 40 MG tablet Take 1 tablet (40 mg total) by mouth 2 (two) times daily. 06/24/23   Regalado, Belkys A, MD  potassium chloride  SA (KLOR-CON  M) 20 MEQ tablet Take 1 tablet (20 mEq total) by mouth daily. 07/05/23   Wendie Hamburg, MD  predniSONE  (DELTASONE ) 10 MG tablet Take 30 mg by mouth daily as needed. 07/03/23   [provider]  rosuvastatin  (CRESTOR ) 20 MG tablet Take 1 tablet (20 mg total) by mouth daily. 02/02/22   Wendie Hamburg, MD  silver  sulfADIAZINE  (SILVADENE ) 1 % cream Apply twice a day to affected area Patient taking differently: Apply 1 Application topically as needed (rash). 10/14/22   Lawrance Presume, MD  tiZANidine  (ZANAFLEX ) 4 MG tablet Take 4 mg by mouth 3 (three) times daily as needed for muscle spasms. 07/07/23   [provider]  traMADol  (ULTRAM ) 50 MG tablet Take 1 tablet (50 mg total) by mouth every evening. Each prescription to last 1  mth Patient taking differently: Take 50 mg by mouth 3 (three) times daily as needed for moderate pain (pain score 4-6) or severe pain (pain score 7-10). 09/28/22   Lawrance Presume, MD  TRUEplus Lancets 28G MISC Use as directed 01/07/19   Lawrance Presume, MD  valsartan  (DIOVAN ) 320 MG tablet Take 1 tablet (320 mg total) by mouth daily. 10/05/22 10/25/23  Wendie Hamburg, MD      Allergies    Lisinopril and Molds & smuts    Review of Systems   Review of Systems  All other systems reviewed and are negative.   Physical Exam Updated Vital Signs BP 136/74 (BP Location: Left Arm)   Pulse (!) 124   Temp (!) 97.2 F (36.2 C)   Resp (!) 22   Ht 5\' 9"  (1.753 m)   Wt 108.9 kg  SpO2 98%   BMI 35.44 kg/m  Physical Exam Vitals and nursing note reviewed.  Constitutional:      Appearance: He is well-developed.  HENT:     Head: Normocephalic and atraumatic.  Cardiovascular:     Rate and Rhythm: Regular rhythm. Tachycardia present.     Heart sounds: No murmur heard. Pulmonary:     Effort: Pulmonary effort is normal. No respiratory distress.     Breath sounds: Normal breath sounds.  Abdominal:     Palpations: Abdomen is soft.     Tenderness: There is no abdominal tenderness. There is no guarding or rebound.  Musculoskeletal:        General: No tenderness.     Comments: 3+ Pitting edema to BLE ecchymosis and tenderness over the right ankle medially. Erythema to mid shin, foot.   Skin:    General: Skin is warm and dry.  Neurological:     Mental Status: He is alert and oriented to person, place, and time.  Psychiatric:        Behavior: Behavior normal.     ED Results / Procedures / Treatments   Labs (all labs ordered are listed, but only abnormal results are displayed) Labs Reviewed  CBC WITH DIFFERENTIAL/PLATELET - Abnormal; Notable for the following components:      Result Value   RBC 3.90 (*)    Hemoglobin 12.7 (*)    HCT 36.6 (*)    nRBC 0.3 (*)    Abs Immature  Granulocytes 0.10 (*)    All other components within normal limits  PRO BRAIN NATRIURETIC PEPTIDE  COMPREHENSIVE METABOLIC PANEL WITH GFR  TROPONIN T, HIGH SENSITIVITY    EKG EKG Interpretation Date/Time:  Wednesday September 06 2023 23:07:30 EDT Ventricular Rate:  124 PR Interval:  136 QRS Duration:  83 QT Interval:  304 QTC Calculation: 437 R Axis:   65  Text Interpretation: Sinus tachycardia Low voltage, extremity leads Confirmed by Kelsey Patricia [phone]) on 09/06/2023 11:52:27 PM  Radiology DG Chest 2 View Result Date: 09/06/2023 CLINICAL DATA:  Shortness of breath EXAM: CHEST - 2 VIEW COMPARISON:  07/02/2023 FINDINGS: Streaky lingular opacity.  Normal cardiac size.  No pneumothorax IMPRESSION: Streaky lingular opacity, atelectasis versus pneumonia. Electronically Signed   By: Esmeralda Hedge M.D.   On: 09/06/2023 23:47    Procedures Procedures  {Document cardiac monitor, telemetry assessment procedure when appropriate:1}  Medications Ordered in ED Medications - No data to display  ED Course/ Medical Decision Making/ A&P   {   Click here for ABCD2, HEART and other calculatorsREFRESH Note before signing :1}                              Medical Decision Making Amount and/or Complexity of Data Reviewed Labs: ordered. Radiology: ordered.   ***  {Document critical care time when appropriate:1} {Document review of labs and clinical decision tools ie heart score, Chads2Vasc2 etc:1}  {Document your independent review of radiology images, and any outside records:1} {Document your discussion with family members, caretakers, and with consultants:1} {Document social determinants of health affecting pt's care:1} {Document your decision making why or why not admission, treatments were needed:1} Final Clinical Impression(s) / ED Diagnoses Final diagnoses:  None    Rx / DC Orders ED Discharge Orders     None

## 2023-09-07 ENCOUNTER — Emergency Department (HOSPITAL_BASED_OUTPATIENT_CLINIC_OR_DEPARTMENT_OTHER)

## 2023-09-07 DIAGNOSIS — I5033 Acute on chronic diastolic (congestive) heart failure: Secondary | ICD-10-CM | POA: Diagnosis present

## 2023-09-07 DIAGNOSIS — E66811 Obesity, class 1: Secondary | ICD-10-CM | POA: Diagnosis present

## 2023-09-07 DIAGNOSIS — I509 Heart failure, unspecified: Secondary | ICD-10-CM | POA: Diagnosis present

## 2023-09-07 DIAGNOSIS — J4489 Other specified chronic obstructive pulmonary disease: Secondary | ICD-10-CM | POA: Diagnosis present

## 2023-09-07 DIAGNOSIS — F419 Anxiety disorder, unspecified: Secondary | ICD-10-CM | POA: Diagnosis present

## 2023-09-07 DIAGNOSIS — E871 Hypo-osmolality and hyponatremia: Secondary | ICD-10-CM | POA: Diagnosis not present

## 2023-09-07 DIAGNOSIS — Z8546 Personal history of malignant neoplasm of prostate: Secondary | ICD-10-CM | POA: Diagnosis not present

## 2023-09-07 DIAGNOSIS — I422 Other hypertrophic cardiomyopathy: Secondary | ICD-10-CM | POA: Diagnosis not present

## 2023-09-07 DIAGNOSIS — I110 Hypertensive heart disease with heart failure: Secondary | ICD-10-CM | POA: Diagnosis present

## 2023-09-07 DIAGNOSIS — M545 Low back pain, unspecified: Secondary | ICD-10-CM | POA: Diagnosis present

## 2023-09-07 DIAGNOSIS — N179 Acute kidney failure, unspecified: Secondary | ICD-10-CM | POA: Diagnosis not present

## 2023-09-07 DIAGNOSIS — D631 Anemia in chronic kidney disease: Secondary | ICD-10-CM | POA: Diagnosis present

## 2023-09-07 DIAGNOSIS — E876 Hypokalemia: Secondary | ICD-10-CM | POA: Diagnosis not present

## 2023-09-07 DIAGNOSIS — G4733 Obstructive sleep apnea (adult) (pediatric): Secondary | ICD-10-CM | POA: Diagnosis present

## 2023-09-07 DIAGNOSIS — I890 Lymphedema, not elsewhere classified: Secondary | ICD-10-CM | POA: Diagnosis present

## 2023-09-07 DIAGNOSIS — K219 Gastro-esophageal reflux disease without esophagitis: Secondary | ICD-10-CM | POA: Diagnosis present

## 2023-09-07 DIAGNOSIS — I10 Essential (primary) hypertension: Secondary | ICD-10-CM | POA: Diagnosis not present

## 2023-09-07 DIAGNOSIS — I421 Obstructive hypertrophic cardiomyopathy: Secondary | ICD-10-CM | POA: Diagnosis present

## 2023-09-07 DIAGNOSIS — J9811 Atelectasis: Secondary | ICD-10-CM | POA: Diagnosis present

## 2023-09-07 DIAGNOSIS — R0602 Shortness of breath: Secondary | ICD-10-CM | POA: Diagnosis not present

## 2023-09-07 DIAGNOSIS — R000 Tachycardia, unspecified: Secondary | ICD-10-CM | POA: Diagnosis present

## 2023-09-07 DIAGNOSIS — Z6837 Body mass index (BMI) 37.0-37.9, adult: Secondary | ICD-10-CM | POA: Diagnosis not present

## 2023-09-07 DIAGNOSIS — E785 Hyperlipidemia, unspecified: Secondary | ICD-10-CM | POA: Diagnosis present

## 2023-09-07 DIAGNOSIS — L03116 Cellulitis of left lower limb: Secondary | ICD-10-CM | POA: Diagnosis present

## 2023-09-07 DIAGNOSIS — G8929 Other chronic pain: Secondary | ICD-10-CM | POA: Diagnosis present

## 2023-09-07 DIAGNOSIS — I2510 Atherosclerotic heart disease of native coronary artery without angina pectoris: Secondary | ICD-10-CM | POA: Diagnosis not present

## 2023-09-07 DIAGNOSIS — I5031 Acute diastolic (congestive) heart failure: Secondary | ICD-10-CM | POA: Diagnosis not present

## 2023-09-07 DIAGNOSIS — I493 Ventricular premature depolarization: Secondary | ICD-10-CM | POA: Diagnosis present

## 2023-09-07 DIAGNOSIS — E1165 Type 2 diabetes mellitus with hyperglycemia: Secondary | ICD-10-CM | POA: Diagnosis present

## 2023-09-07 DIAGNOSIS — F17210 Nicotine dependence, cigarettes, uncomplicated: Secondary | ICD-10-CM | POA: Diagnosis present

## 2023-09-07 DIAGNOSIS — E1122 Type 2 diabetes mellitus with diabetic chronic kidney disease: Secondary | ICD-10-CM | POA: Diagnosis present

## 2023-09-07 DIAGNOSIS — E7800 Pure hypercholesterolemia, unspecified: Secondary | ICD-10-CM | POA: Diagnosis not present

## 2023-09-07 LAB — TROPONIN T, HIGH SENSITIVITY: Troponin T High Sensitivity: 31 ng/L — ABNORMAL HIGH (ref <19)

## 2023-09-07 LAB — GLUCOSE, CAPILLARY
Glucose-Capillary: 234 mg/dL — ABNORMAL HIGH (ref 70–99)
Glucose-Capillary: 250 mg/dL — ABNORMAL HIGH (ref 70–99)

## 2023-09-07 MED ORDER — TIZANIDINE HCL 4 MG PO TABS
4.0000 mg | ORAL_TABLET | Freq: Three times a day (TID) | ORAL | Status: DC | PRN
  Administered 2023-09-08: 4 mg via ORAL
  Filled 2023-09-07: qty 1

## 2023-09-07 MED ORDER — BUDESON-GLYCOPYRROL-FORMOTEROL 160-9-4.8 MCG/ACT IN AERO
2.0000 | INHALATION_SPRAY | Freq: Every day | RESPIRATORY_TRACT | Status: DC
  Administered 2023-09-07 – 2023-09-13 (×7): 2 via RESPIRATORY_TRACT
  Filled 2023-09-07: qty 5.9

## 2023-09-07 MED ORDER — ONDANSETRON HCL 4 MG/2ML IJ SOLN: 4.0000 mg | Freq: Four times a day (QID) | INTRAMUSCULAR | Status: DC | PRN

## 2023-09-07 MED ORDER — AMLODIPINE BESYLATE 10 MG PO TABS
5.0000 mg | ORAL_TABLET | Freq: Every day | ORAL | Status: DC
Start: 2023-09-07 — End: 2023-09-10
  Administered 2023-09-07 – 2023-09-10 (×4): 5 mg via ORAL
  Filled 2023-09-07 (×4): qty 1

## 2023-09-07 MED ORDER — FUROSEMIDE 10 MG/ML IJ SOLN
40.0000 mg | Freq: Once | INTRAMUSCULAR | Status: AC
  Administered 2023-09-07: 40 mg via INTRAVENOUS
  Filled 2023-09-07: qty 4

## 2023-09-07 MED ORDER — INSULIN ASPART 100 UNIT/ML IJ SOLN
0.0000 [IU] | Freq: Every day | INTRAMUSCULAR | Status: DC
  Administered 2023-09-07: 2 [IU] via SUBCUTANEOUS
  Administered 2023-09-10: 3 [IU] via SUBCUTANEOUS
  Administered 2023-09-13: 2 [IU] via SUBCUTANEOUS

## 2023-09-07 MED ORDER — OXYCODONE HCL 5 MG PO TABS
5.0000 mg | ORAL_TABLET | ORAL | Status: DC | PRN
  Administered 2023-09-07 – 2023-09-08 (×2): 5 mg via ORAL
  Filled 2023-09-07 (×3): qty 1

## 2023-09-07 MED ORDER — CEFAZOLIN SODIUM-DEXTROSE 2-4 GM/100ML-% IV SOLN
2.0000 g | Freq: Once | INTRAVENOUS | Status: AC
  Administered 2023-09-07: 2 g via INTRAVENOUS
  Filled 2023-09-07: qty 100

## 2023-09-07 MED ORDER — CEFAZOLIN SODIUM-DEXTROSE 2-4 GM/100ML-% IV SOLN
2.0000 g | Freq: Three times a day (TID) | INTRAVENOUS | Status: AC
  Administered 2023-09-07 – 2023-09-14 (×21): 2 g via INTRAVENOUS
  Filled 2023-09-07 (×23): qty 100

## 2023-09-07 MED ORDER — INSULIN ASPART 100 UNIT/ML IJ SOLN
0.0000 [IU] | Freq: Three times a day (TID) | INTRAMUSCULAR | Status: DC
  Administered 2023-09-07: 5 [IU] via SUBCUTANEOUS
  Administered 2023-09-08 (×2): 2 [IU] via SUBCUTANEOUS
  Administered 2023-09-08: 3 [IU] via SUBCUTANEOUS
  Administered 2023-09-09 (×2): 5 [IU] via SUBCUTANEOUS
  Administered 2023-09-09: 8 [IU] via SUBCUTANEOUS
  Administered 2023-09-10 – 2023-09-12 (×8): 5 [IU] via SUBCUTANEOUS
  Administered 2023-09-12: 8 [IU] via SUBCUTANEOUS
  Administered 2023-09-13 (×2): 3 [IU] via SUBCUTANEOUS
  Administered 2023-09-13: 2 [IU] via SUBCUTANEOUS
  Administered 2023-09-14: 3 [IU] via SUBCUTANEOUS

## 2023-09-07 MED ORDER — ACETAMINOPHEN 650 MG RE SUPP: 650.0000 mg | Freq: Four times a day (QID) | RECTAL | Status: DC | PRN

## 2023-09-07 MED ORDER — ASPIRIN 81 MG PO TBEC
81.0000 mg | DELAYED_RELEASE_TABLET | Freq: Every day | ORAL | Status: DC
  Administered 2023-09-07 – 2023-09-14 (×8): 81 mg via ORAL
  Filled 2023-09-07 (×8): qty 1

## 2023-09-07 MED ORDER — IPRATROPIUM-ALBUTEROL 0.5-2.5 (3) MG/3ML IN SOLN: 3.0000 mL | Freq: Four times a day (QID) | RESPIRATORY_TRACT | Status: DC | PRN

## 2023-09-07 MED ORDER — TRAZODONE HCL 50 MG PO TABS
25.0000 mg | ORAL_TABLET | Freq: Every evening | ORAL | Status: DC | PRN
  Administered 2023-09-09 – 2023-09-13 (×5): 25 mg via ORAL
  Filled 2023-09-07 (×5): qty 1

## 2023-09-07 MED ORDER — FENTANYL CITRATE PF 50 MCG/ML IJ SOSY
50.0000 ug | PREFILLED_SYRINGE | Freq: Once | INTRAMUSCULAR | Status: AC
  Administered 2023-09-07: 50 ug via INTRAVENOUS
  Filled 2023-09-07: qty 1

## 2023-09-07 MED ORDER — METOPROLOL SUCCINATE ER 50 MG PO TB24
150.0000 mg | ORAL_TABLET | Freq: Every day | ORAL | Status: DC
  Administered 2023-09-07 – 2023-09-08 (×2): 150 mg via ORAL
  Filled 2023-09-07 (×2): qty 1

## 2023-09-07 MED ORDER — SODIUM CHLORIDE 0.9 % IV SOLN: INTRAVENOUS | Status: AC | PRN

## 2023-09-07 MED ORDER — GABAPENTIN 300 MG PO CAPS: 300.0000 mg | ORAL_CAPSULE | Freq: Every day | ORAL | Status: DC | PRN

## 2023-09-07 MED ORDER — ACETAMINOPHEN 325 MG PO TABS: 650.0000 mg | ORAL_TABLET | Freq: Four times a day (QID) | ORAL | Status: DC | PRN

## 2023-09-07 MED ORDER — FUROSEMIDE 10 MG/ML IJ SOLN
40.0000 mg | Freq: Every day | INTRAMUSCULAR | Status: DC
  Administered 2023-09-07 – 2023-09-08 (×2): 40 mg via INTRAVENOUS
  Filled 2023-09-07 (×2): qty 4

## 2023-09-07 MED ORDER — ROSUVASTATIN CALCIUM 20 MG PO TABS
20.0000 mg | ORAL_TABLET | Freq: Every day | ORAL | Status: DC
  Administered 2023-09-07 – 2023-09-14 (×7): 20 mg via ORAL
  Filled 2023-09-07 (×8): qty 1

## 2023-09-07 MED ORDER — ONDANSETRON HCL 4 MG PO TABS: 4.0000 mg | ORAL_TABLET | Freq: Four times a day (QID) | ORAL | Status: DC | PRN

## 2023-09-07 MED ORDER — OXYCODONE-ACETAMINOPHEN 5-325 MG PO TABS
1.0000 | ORAL_TABLET | Freq: Once | ORAL | Status: AC
  Administered 2023-09-07: 1 via ORAL
  Filled 2023-09-07: qty 1

## 2023-09-07 MED ORDER — DM-GUAIFENESIN ER 30-600 MG PO TB12
1.0000 | ORAL_TABLET | Freq: Two times a day (BID) | ORAL | Status: DC
  Administered 2023-09-08 – 2023-09-14 (×8): 1 via ORAL
  Filled 2023-09-07 (×11): qty 1

## 2023-09-07 MED ORDER — OXYCODONE HCL 5 MG PO TABS
5.0000 mg | ORAL_TABLET | ORAL | Status: DC | PRN
  Administered 2023-09-07: 5 mg via ORAL
  Filled 2023-09-07: qty 1

## 2023-09-07 MED ORDER — HEPARIN SODIUM (PORCINE) 5000 UNIT/ML IJ SOLN
5000.0000 [IU] | Freq: Three times a day (TID) | INTRAMUSCULAR | Status: DC
  Administered 2023-09-07 – 2023-09-14 (×20): 5000 [IU] via SUBCUTANEOUS
  Filled 2023-09-07 (×20): qty 1

## 2023-09-07 MED ORDER — PANTOPRAZOLE SODIUM 40 MG PO TBEC
40.0000 mg | DELAYED_RELEASE_TABLET | Freq: Two times a day (BID) | ORAL | Status: DC
  Administered 2023-09-07 – 2023-09-14 (×9): 40 mg via ORAL
  Filled 2023-09-07 (×13): qty 1

## 2023-09-07 MED ORDER — POTASSIUM CHLORIDE CRYS ER 20 MEQ PO TBCR
20.0000 meq | EXTENDED_RELEASE_TABLET | Freq: Every day | ORAL | Status: DC
  Administered 2023-09-08 – 2023-09-09 (×2): 20 meq via ORAL
  Filled 2023-09-07 (×2): qty 1

## 2023-09-07 NOTE — Progress Notes (Signed)
   09/07/23 1312  Assess: MEWS Score  Temp 98.3 F (36.8 C)  BP (!) 158/89  MAP (mmHg) 108  Pulse Rate (!) 120  Resp 16  Level of Consciousness Alert  SpO2 98 %  O2 Device Nasal Cannula  O2 Flow Rate (L/min) 3 L/min  Assess: MEWS Score  MEWS Temp 0  MEWS Systolic 0  MEWS Pulse 2  MEWS RR 0  MEWS LOC 0  MEWS Score 2  MEWS Score Color Yellow  Assess: if the MEWS score is Yellow or Red  Were vital signs accurate and taken at a resting state? Yes  Does the patient meet 2 or more of the SIRS criteria? No  MEWS guidelines implemented  Yes, yellow  Treat  MEWS Interventions Considered administering scheduled or prn medications/treatments as ordered  Take Vital Signs  Increase Vital Sign Frequency  Yellow: Q2hr x1, continue Q4hrs until patient remains green for 12hrs  Escalate  MEWS: Escalate Yellow: Discuss with charge nurse and consider notifying provider and/or RRT  Notify: Charge Nurse/RN  Name of Charge Nurse/RN Notified Engineer, drilling  Assess: SIRS CRITERIA  SIRS Temperature  0  SIRS Respirations  0  SIRS Pulse 1  SIRS WBC 0  SIRS Score Sum  1

## 2023-09-07 NOTE — ED Notes (Signed)
 Pt provided snacks and ginger ale per request. Provider aware

## 2023-09-07 NOTE — ED Notes (Signed)
 [facility] called for transport to [facility] @11 :22am.  Spoke with [employer]

## 2023-09-07 NOTE — Procedures (Signed)
 PHARMACY ROUNDING NOTE  Joseph Harvey is a 68 y.o. male awaiting admission. A chart review was completed to evaluate prior to admission medications, antibiotic therapy and labs/vitals. The following interventions were made:  Ordering ongoing cefazolin  doses  This was discussed with the ED or admitting provider and/or nurse/paramedic.   Jerri Morale, PharmD, BCPS, BCEMP Clinical Pharmacist Please see AMION for all pharmacy numbers 09/07/2023 9:17 AM

## 2023-09-07 NOTE — Plan of Care (Signed)
 Plan of Care Note for accepted transfer  Patient: Joseph Harvey    MVH:[id number]  DOA: 09/06/2023     Facility requesting transfer: [facility] ED  Requesting Provider: Kelsey Patricia, MD  Reason for transfer: HF exacerbation / cellulitis  Facility course:   23 M with hx ?HFpEF, HCM with mild LVOT obstruction, PVC/NSVT with hx ablation, COPD/asthma, HTN, HLD, DM, smoking, obesity, presented with bilateral LE edema. Suspected HF exacerbation. BNP 524, trop low level elevation + flat. Got Lasix  40 mg IV x 1 and Cefazolin  with question of superimposed cellulitis LLE.   Plan of care: The patient is accepted for admission to Telemetry unit, at [facility]    Author: Arnulfo Larch, MD  09/07/2023  Check [email or web address] for on-call coverage.  Nursing staff, Please call [facility] Admits & Consults System-Wide number on Amion as soon as patient's arrival, so appropriate admitting provider can evaluate the pt.

## 2023-09-07 NOTE — Progress Notes (Signed)
 Assumed care of pt from off going RN.No changes in Initial am assessment at this time.Continue with plan of Care.

## 2023-09-07 NOTE — H&P (Signed)
 History and Physical  Joseph Harvey UVO:[id number] DOB: 1955/05/28 DOA: 09/06/2023  PCP: Vevelyn Gowers, NP   Chief Complaint: Shortness of breath, leg swelling  HPI: Joseph Harvey is a 68 y.o. male with medical history significant for chronic heart failure with preserved EF, hypertrophic cardiomyopathy, hypertension, non-insulin -dependent type 2 diabetes, hyperlipidemia being admitted to the hospital with several days of lower extremity edema and new onset erythema with concern for acute on chronic diastolic congestive heart failure and left lower extremity cellulitis.  Patient states he has been having lower extremity edema on and off, I also reviewed his cardiology notes which confirms this.  His amlodipine  dose was recently reduced to try and control his lower extremity edema, he was also started on as needed and then daily oral Lasix  which she has been taking.  He feels that he has probably gained about 11 pounds over the last few days.  He is not having feeling abdominal fullness, has some mild orthopnea, though he usually does not lie flat because of chronic pain in his low back.  Since he was in the ER for the lower extremity swelling, he also started to develop erythema in the left lower extremity.  He has some pain there in the ankle, but not in his calves.  He denies any chest pain, fevers, cough, nausea, vomiting.  Review of Systems: Please see HPI for pertinent positives and negatives. A complete 10 system review of systems are otherwise negative.  Past Medical History:  Diagnosis Date   Allergy     Dust, mold, dust mites   Anemia    Anxiety    Asthma    Cancer (HCC)    prostate   Cataract    bilateral repair.   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 He started smoking about 49 years ago. He has a 37 pack-year smoking history. He has never used smokeless tobacco. He reports that he does not currently use alcohol after a past usage of about 3.0 standard drinks of alcohol per week. He reports that he does not use drugs.  Allergies  Allergen Reactions   Lisinopril Shortness Of Breath   Molds & Smuts Anaphylaxis    Family History  Problem Relation Age of Onset   Colon cancer Father    Lung cancer Father        was a smoker   Prostate cancer Father    Lung cancer Mother        was a smoker   Asthma Mother    Allergies Brother    Diabetes Maternal Grandmother  Colon polyps Neg Hx    Esophageal cancer Neg Hx    Stomach cancer Neg Hx    Rectal cancer Neg Hx      Prior to Admission medications   Medication Sig Start Date End Date Taking? Authorizing Provider  LINZESS  145 MCG CAPS capsule Take 145 mcg by mouth daily. 08/18/23  Yes [provider]  acetaminophen  (TYLENOL ) 325 MG tablet Take 2 tablets (650 mg total) by mouth every 4 (four) hours as needed for headache or mild pain. Patient taking differently: Take 325 mg by mouth 2 (two) times daily as needed for headache, mild pain (pain score 1-3) or moderate pain (pain score 4-6). 08/11/21   Tylene Galla, PA-C  albuterol  (PROVENTIL ) (2.5 MG/3ML) 0.083% nebulizer solution TAKE 3 MLS BY NEBULIZATION EVERY 4 HOURS AS NEEDED  FOR WHEEZING OR SHORTNESS OF BREATH (((PLAN B))) Patient taking differently: Take 3 mLs by nebulization every 4 (four) hours as needed for wheezing or shortness of breath. 07/09/23   Hunsucker, Archer Kobs, MD  albuterol  (VENTOLIN  HFA) 108 [phone] Base) MCG/ACT inhaler Inhale 2 puffs into the lungs every 4 hours as needed for shortness of breath or wheezing 01/11/23   Newlin, Enobong, MD  amLODipine  (NORVASC ) 5 MG tablet Take 1 tablet (5 mg total) by mouth daily. 07/12/23   Wendie Hamburg, MD  ASPIRIN  81 PO Take 81 mg by mouth daily.    [provider]  Budeson-Glycopyrrol-Formoterol  (BREZTRI  AEROSPHERE) 160-9-4.8 MCG/ACT AERO Inhale 2 puffs into the lungs in the morning and at bedtime. 02/20/23   Hunsucker, Archer Kobs, MD  busPIRone  (BUSPAR ) 10 MG tablet Take 1 tablet (10 mg total) by mouth 3 (three) times daily. Patient taking differently: Take 10 mg by mouth 2 (two) times daily as needed (anxiety). 09/11/20   Lawrance Presume, MD  cephALEXin (KEFLEX) 500 MG capsule Take 500 mg by mouth 2 (two) times daily. 08/30/23   [provider]  Cholecalciferol  (VITAMIN D PO) Take 1 tablet by mouth daily.    [provider]  cyclobenzaprine (FLEXERIL) 10 MG tablet Take 10 mg by mouth with breakfast, with lunch, and with evening meal. 08/12/23   [provider]  dextromethorphan -guaiFENesin  (MUCINEX  DM) 30-600 MG 12hr tablet Take 1 tablet by mouth 2 (two) times daily. 06/24/23   Regalado, Belkys A, MD  doxycycline  (VIBRAMYCIN ) 100 MG capsule Take 1 capsule (100 mg total) by mouth 2 (two) times daily. Patient taking differently: Take 100 mg by mouth as needed. 07/02/23   Felicie Horning, PA-C  Dupilumab  (DUPIXENT ) 300 MG/2ML SOAJ Inject 300 mg into the skin every 14 (fourteen) days. **loading dose completed in clinic on 12/15/22** 05/03/23   Hunsucker, Archer Kobs, MD  ezetimibe  (ZETIA ) 10 MG tablet Take 1 tablet (10 mg total) by mouth daily. 01/11/23   Wendie Hamburg, MD   famotidine  (PEPCID ) 20 MG tablet Take 20-40 mg by mouth daily as needed for heartburn or indigestion. 05/14/23   [provider]  furosemide  (LASIX ) 40 MG tablet Take 40 mg daily 07/05/23   Wendie Hamburg, MD  gabapentin  (NEURONTIN ) 300 MG capsule take 1 capsule by mouth in the morning, then take 1 at lunch and 2 at bedtime Patient taking differently: Take 300 mg by mouth daily as needed (Nerve pain). 12/29/20     glucose blood (TRUE METRIX BLOOD GLUCOSE TEST) test strip Use as instructed 01/07/19   Lawrance Presume, MD  ipratropium (ATROVENT ) 0.06 % nasal spray Place 2 sprays into both  nostrils 3 (three) times daily. As needed for nasal congestion, runny nose 06/24/23   Regalado, Belkys A, MD  ipratropium-albuterol  (DUONEB) 0.5-2.5 (3) MG/3ML SOLN INHALE 1 VIAL VIA NEBULIZER TWICE A DAY 06/24/23   Regalado, Belkys A, MD  MAGnesium -Oxide 400 (240 Mg) MG tablet Take 1 tablet (400 mg total) by mouth daily. 07/07/23   Wendie Hamburg, MD  metFORMIN  (GLUCOPHAGE -XR) 500 MG 24 hr tablet Take 2 tablets (1,000 mg total) by mouth in the morning AND 1 tablet (500 mg total) every evening with meals. 01/24/23   Lawrance Presume, MD  metoprolol  succinate (TOPROL -XL) 100 MG 24 hr tablet Take 1 tablet (100 mg total) by mouth daily. Take with or immediately following a meal. Patient taking differently: Take 100 mg by mouth daily. Take with 50 mg tablet to equal 150 mg 05/29/23 08/27/23  Johnson, Kathleen R, PA-C  metoprolol  succinate (TOPROL -XL) 50 MG 24 hr tablet Take 1 tablet (50 mg total) by mouth daily. Take with or immediately following a meal. Patient taking differently: Take 50 mg by mouth daily. Take with 100 mg tablet to equal 150 mg 05/25/23 08/23/23  Johnson, Kathleen R, PA-C  montelukast  (SINGULAIR ) 10 MG tablet Take 1 tablet (10 mg total) by mouth at bedtime. Patient taking differently: Take 10 mg by mouth at bedtime as needed (allergies). 01/11/22   Lawrance Presume, MD  nicotine   (NICODERM CQ  - DOSED IN MG/24 HR) 7 mg/24hr patch Place 1 patch (7 mg total) onto the skin daily. 06/24/23   Regalado, Belkys A, MD  omeprazole (PRILOSEC) 40 MG capsule Take 40 mg by mouth daily. 07/21/23   [provider]  pantoprazole  (PROTONIX ) 40 MG tablet Take 1 tablet (40 mg total) by mouth 2 (two) times daily. 06/24/23   Regalado, Belkys A, MD  potassium chloride  SA (KLOR-CON  M) 20 MEQ tablet Take 1 tablet (20 mEq total) by mouth daily. 07/05/23   Wendie Hamburg, MD  predniSONE  (DELTASONE ) 10 MG tablet Take 30 mg by mouth daily as needed. 07/03/23   [provider]  rosuvastatin  (CRESTOR ) 20 MG tablet Take 1 tablet (20 mg total) by mouth daily. 02/02/22   Wendie Hamburg, MD  silver  sulfADIAZINE  (SILVADENE ) 1 % cream Apply twice a day to affected area Patient taking differently: Apply 1 Application topically as needed (rash). 10/14/22   Lawrance Presume, MD  tiZANidine  (ZANAFLEX ) 4 MG tablet Take 4 mg by mouth 3 (three) times daily as needed for muscle spasms. 07/07/23   [provider]  traMADol  (ULTRAM ) 50 MG tablet Take 1 tablet (50 mg total) by mouth every evening. Each prescription to last 1 mth Patient taking differently: Take 50 mg by mouth 3 (three) times daily as needed for moderate pain (pain score 4-6) or severe pain (pain score 7-10). 09/28/22   Lawrance Presume, MD  TRUEplus Lancets 28G MISC Use as directed 01/07/19   Lawrance Presume, MD  valsartan  (DIOVAN ) 320 MG tablet Take 1 tablet (320 mg total) by mouth daily. 10/05/22 10/25/23  Wendie Hamburg, MD    Physical Exam: BP (!) 158/89 (BP Location: Right Arm)   Pulse (!) 120   Temp 98.3 F (36.8 C) (Oral)   Resp 16   Ht 5\' 9"  (1.753 m)   Wt 106.8 kg   SpO2 98%   BMI 34.78 kg/m  General:  Alert, oriented, calm, in no acute distress, speaking full sentences and ambulating on room air. Cardiovascular: RRR, no murmurs or rubs, he  has 2-3+ pitting bilateral lower extremity edema,  with associated erythema in the left lower extremity.  No areas of fluctuance or tenderness, no open areas or drainage Respiratory: clear to auscultation bilaterally, no wheezes, no crackles  Abdomen: soft, nontender, nondistended, normal bowel tones heard  Skin: dry, no rashes  Musculoskeletal: no joint effusions, normal range of motion  Psychiatric: appropriate affect, normal speech  Neurologic: extraocular muscles intact, clear speech, moving all extremities with intact sensorium            Labs on Admission:  Basic Metabolic Panel: Recent Labs  Lab 09/06/23 2307  NA 139  K 4.2  CL 96*  CO2 22  GLUCOSE 167*  BUN 24*  CREATININE 1.47*  CALCIUM  10.0   Liver Function Tests: Recent Labs  Lab 09/06/23 2307  AST 23  ALT 36  ALKPHOS 86  BILITOT 0.5  PROT 7.0  ALBUMIN 4.4   No results for input(s): "LIPASE", "AMYLASE" in the last 168 hours. No results for input(s): "AMMONIA" in the last 168 hours. CBC: Recent Labs  Lab 09/06/23 2307  WBC 7.2  NEUTROABS 5.0  HGB 12.7*  HCT 36.6*  MCV 93.8  PLT 354   Cardiac Enzymes: No results for input(s): "CKTOTAL", "CKMB", "CKMBINDEX", "TROPONINI" in the last 168 hours. BNP (last 3 results) Recent Labs    06/19/23 1412 06/20/23 0651 07/02/23 1718  BNP 50.7 70.4 97.8    ProBNP (last 3 results) Recent Labs    09/06/23 2307  PROBNP 524.0*    CBG: No results for input(s): "GLUCAP" in the last 168 hours.  Radiological Exams on Admission: DG Ankle Complete Left Result Date: 09/07/2023 CLINICAL DATA:  Atraumatic left ankle pain and swelling. EXAM: LEFT ANKLE COMPLETE - 3+ VIEW COMPARISON:  None Available. FINDINGS: There is no evidence of fracture, dislocation, or joint effusion. There is no evidence of arthropathy or other focal bone abnormality. Marked severity diffuse soft tissue swelling is seen. IMPRESSION: Marked severity diffuse soft tissue swelling without evidence of an acute osseous abnormality. Electronically  Signed   By: Virgle Grime M.D.   On: 09/07/2023 02:55   US  Venous Img Lower  Left (DVT Study) Result Date: 09/07/2023 CLINICAL DATA:  Left lower leg swelling, pain and redness. EXAM: LEFT LOWER EXTREMITY VENOUS DOPPLER ULTRASOUND TECHNIQUE: Gray-scale sonography with graded compression, as well as color Doppler and duplex ultrasound were performed to evaluate the lower extremity deep venous systems from the level of the common femoral vein and including the common femoral, femoral, profunda femoral, popliteal and calf veins including the posterior tibial, peroneal and gastrocnemius veins when visible. The superficial great saphenous vein was also interrogated. Spectral Doppler was utilized to evaluate flow at rest and with distal augmentation maneuvers in the common femoral, femoral and popliteal veins. COMPARISON:  None Available. FINDINGS: Contralateral Common Femoral Vein: Respiratory phasicity is normal and symmetric with the symptomatic side. No evidence of thrombus. Normal compressibility. Common Femoral Vein: No evidence of thrombus. Normal compressibility, respiratory phasicity and response to augmentation. Saphenofemoral Junction: No evidence of thrombus. Normal compressibility and flow on color Doppler imaging. Profunda Femoral Vein: No evidence of thrombus. Normal compressibility and flow on color Doppler imaging. Femoral Vein: No evidence of thrombus. Normal compressibility, respiratory phasicity and response to augmentation. Popliteal Vein: No evidence of thrombus. Normal compressibility, respiratory phasicity and response to augmentation. Calf Veins: No evidence of thrombus. Normal compressibility and flow on color Doppler imaging. Superficial Great Saphenous Vein: No evidence of thrombus. Normal compressibility. Venous Reflux:  None. Other Findings:  None. IMPRESSION: No evidence of deep venous thrombosis within the LEFT lower extremity. Electronically Signed   By: Virgle Grime M.D.   On:  09/07/2023 01:43   DG Chest 2 View Result Date: 09/06/2023 CLINICAL DATA:  Shortness of breath EXAM: CHEST - 2 VIEW COMPARISON:  07/02/2023 FINDINGS: Streaky lingular opacity.  Normal cardiac size.  No pneumothorax IMPRESSION: Streaky lingular opacity, atelectasis versus pneumonia. Electronically Signed   By: Esmeralda Hedge M.D.   On: 09/06/2023 23:47   Echo 08/04/2023: 1. Peak LVOT velocity 2. 4 m/ s, PG 23 mmHg. Left ventricular ejection fraction, by estimation, is > 75% . The left ventricle has hyperdynamic function. The left ventricle has no regional wall motion abnormalities. There is moderate left ventricular hypertrophy of the basal- septal segment. Left ventricular diastolic parameters are consistent with Grade I diastolic dysfunction ( impaired relaxation) . 2. Right ventricular systolic function is normal. The right ventricular size is normal. 3. The mitral valve is normal in structure. No evidence of mitral valve regurgitation. No evidence of mitral stenosis. 4. The aortic valve is normal in structure. Aortic valve regurgitation is not visualized. No aortic stenosis is present. 5. The inferior vena cava is normal in size with greater J8  Assessment/Plan Joseph Harvey is a 68 y.o. male with medical history significant for chronic heart failure with preserved EF, hypertrophic cardiomyopathy, hypertension, non-insulin -dependent type 2 diabetes, hyperlipidemia being admitted to the hospital with several days of lower extremity edema and new onset erythema with concern for acute on chronic diastolic congestive heart failure and left lower extremity cellulitis.    Acute on chronic heart failure with preserved EF-in the setting of hypertrophic cardiomyopathy.  With elevated BNP, lower extremity edema. -Inpatient admission -Telemetry monitoring -Continue home beta-blockade, aspirin  -Continue diuresis with Lasix  40 mg IV daily, with potassium supplementation -Will request inpatient cardiology  evaluation and management given his hypertrophic cardiomyopathy  Acute kidney injury in the setting of CKD stage III-possibly related to fluid overload. -Will diurese as above -Avoid nephrotoxins, hold ARB today -Monitor renal function with daily labs  Left lower extremity cellulitis-Doppler ultrasound has ruled out DVT, x-ray has ruled out deeper infection or any evidence of osteomyelitis -Continue empiric Ancef   Elevated troponin-flat, without cardiac symptoms.  Also in the setting of AKI.  Likely no further workup necessary.  GERD-Protonix   Hyperlipidemia-Crestor   DVT prophylaxis: Subcutaneous heparin     Code Status: Full Code  Consults called: Cardiology, to see in a.m.  Admission status: The appropriate patient status for this patient is INPATIENT. Inpatient status is judged to be reasonable and necessary in order to provide the required intensity of service to ensure the patient's safety. The patient's presenting symptoms, physical exam findings, and initial radiographic and laboratory data in the context of their chronic comorbidities is felt to place them at high risk for further clinical deterioration. Furthermore, it is not anticipated that the patient will be medically stable for discharge from the hospital within 2 midnights of admission.    I certify that at the point of admission it is my clinical judgment that the patient will require inpatient hospital care spanning beyond 2 midnights from the point of admission due to high intensity of service, high risk for further deterioration and high frequency of surveillance required  Time spent: 56 minutes  Joseph Garton Rickey Charm MD [facility] Pager [phone]  If 7PM-7AM, please contact night-coverage [email or web address] Password [email or web address]  09/07/2023, 2:16 PM

## 2023-09-08 DIAGNOSIS — N179 Acute kidney failure, unspecified: Secondary | ICD-10-CM | POA: Diagnosis not present

## 2023-09-08 DIAGNOSIS — I493 Ventricular premature depolarization: Secondary | ICD-10-CM

## 2023-09-08 DIAGNOSIS — I5033 Acute on chronic diastolic (congestive) heart failure: Secondary | ICD-10-CM | POA: Diagnosis not present

## 2023-09-08 DIAGNOSIS — E785 Hyperlipidemia, unspecified: Secondary | ICD-10-CM

## 2023-09-08 DIAGNOSIS — R000 Tachycardia, unspecified: Secondary | ICD-10-CM | POA: Diagnosis not present

## 2023-09-08 DIAGNOSIS — I422 Other hypertrophic cardiomyopathy: Secondary | ICD-10-CM | POA: Diagnosis not present

## 2023-09-08 LAB — GLUCOSE, CAPILLARY
Glucose-Capillary: 139 mg/dL — ABNORMAL HIGH (ref 70–99)
Glucose-Capillary: 152 mg/dL — ABNORMAL HIGH (ref 70–99)
Glucose-Capillary: 144 mg/dL — ABNORMAL HIGH (ref 70–99)
Glucose-Capillary: 193 mg/dL — ABNORMAL HIGH (ref 70–99)

## 2023-09-08 LAB — CBC
HCT: 38.3 % — ABNORMAL LOW (ref 39.0–52.0)
Hemoglobin: 12.4 g/dL — ABNORMAL LOW (ref 13.0–17.0)
MCH: 31.9 pg (ref 26.0–34.0)
MCHC: 32.4 g/dL (ref 30.0–36.0)
MCV: 98.5 fL (ref 80.0–100.0)
Platelets: 290 10*3/uL (ref 150–400)
RBC: 3.89 MIL/uL — ABNORMAL LOW (ref 4.22–5.81)
RDW: 13.3 % (ref 11.5–15.5)
WBC: 7 10*3/uL (ref 4.0–10.5)
nRBC: 0 % (ref 0.0–0.2)

## 2023-09-08 LAB — BASIC METABOLIC PANEL WITH GFR
Anion gap: 14 (ref 5–15)
BUN: 26 mg/dL — ABNORMAL HIGH (ref 8–23)
CO2: 30 mmol/L (ref 22–32)
Calcium: 9.3 mg/dL (ref 8.9–10.3)
Chloride: 92 mmol/L — ABNORMAL LOW (ref 98–111)
Creatinine, Ser: 1.43 mg/dL — ABNORMAL HIGH (ref 0.61–1.24)
GFR, Estimated: 54 mL/min — ABNORMAL LOW (ref >60)
Glucose, Bld: 131 mg/dL — ABNORMAL HIGH (ref 70–99)
Potassium: 3.7 mmol/L (ref 3.5–5.1)
Sodium: 136 mmol/L (ref 135–145)

## 2023-09-08 MED ORDER — MONTELUKAST SODIUM 10 MG PO TABS
10.0000 mg | ORAL_TABLET | Freq: Every evening | ORAL | Status: DC | PRN
  Administered 2023-09-08 (×2): 10 mg via ORAL
  Filled 2023-09-08 (×2): qty 1

## 2023-09-08 MED ORDER — LORATADINE 10 MG PO TABS
10.0000 mg | ORAL_TABLET | Freq: Every day | ORAL | Status: DC
  Administered 2023-09-08 – 2023-09-14 (×7): 10 mg via ORAL
  Filled 2023-09-08 (×7): qty 1

## 2023-09-08 MED ORDER — METOPROLOL SUCCINATE ER 100 MG PO TB24
100.0000 mg | ORAL_TABLET | Freq: Two times a day (BID) | ORAL | Status: DC
  Administered 2023-09-08: 100 mg via ORAL
  Filled 2023-09-08: qty 1

## 2023-09-08 MED ORDER — CHOLECALCIFEROL 10 MCG (400 UNIT) PO TABS
400.0000 [IU] | ORAL_TABLET | Freq: Every day | ORAL | Status: DC
  Administered 2023-09-08 – 2023-09-14 (×7): 400 [IU] via ORAL
  Filled 2023-09-08 (×7): qty 1

## 2023-09-08 MED ORDER — IPRATROPIUM-ALBUTEROL 0.5-2.5 (3) MG/3ML IN SOLN
3.0000 mL | Freq: Two times a day (BID) | RESPIRATORY_TRACT | Status: DC
  Administered 2023-09-09 – 2023-09-14 (×10): 3 mL via RESPIRATORY_TRACT
  Filled 2023-09-08 (×9): qty 3

## 2023-09-08 MED ORDER — CALCITONIN (SALMON) 200 UNIT/ACT NA SOLN
1.0000 | Freq: Every day | NASAL | Status: DC
  Administered 2023-09-08 – 2023-09-14 (×7): 1 via NASAL
  Filled 2023-09-08: qty 3.7

## 2023-09-08 MED ORDER — CYCLOBENZAPRINE HCL 5 MG PO TABS
5.0000 mg | ORAL_TABLET | Freq: Three times a day (TID) | ORAL | Status: DC | PRN
  Administered 2023-09-11: 5 mg via ORAL
  Filled 2023-09-08: qty 1

## 2023-09-08 MED ORDER — FUROSEMIDE 10 MG/ML IJ SOLN
40.0000 mg | Freq: Two times a day (BID) | INTRAMUSCULAR | Status: DC
  Administered 2023-09-08 – 2023-09-10 (×4): 40 mg via INTRAVENOUS
  Filled 2023-09-08 (×4): qty 4

## 2023-09-08 MED ORDER — TRAMADOL HCL 50 MG PO TABS
50.0000 mg | ORAL_TABLET | Freq: Four times a day (QID) | ORAL | Status: DC | PRN
  Administered 2023-09-08 – 2023-09-14 (×12): 50 mg via ORAL
  Filled 2023-09-08 (×12): qty 1

## 2023-09-08 MED ORDER — NICOTINE 14 MG/24HR TD PT24
14.0000 mg | MEDICATED_PATCH | Freq: Every day | TRANSDERMAL | Status: DC
  Administered 2023-09-08: 14 mg via TRANSDERMAL
  Filled 2023-09-08 (×3): qty 1

## 2023-09-08 MED ORDER — METOPROLOL SUCCINATE ER 100 MG PO TB24
100.0000 mg | ORAL_TABLET | Freq: Two times a day (BID) | ORAL | Status: DC
  Administered 2023-09-09 – 2023-09-10 (×3): 100 mg via ORAL
  Filled 2023-09-08 (×3): qty 1

## 2023-09-08 NOTE — Progress Notes (Signed)
 PROGRESS NOTE Joseph Harvey  ZOX:[id number] DOB: Feb 24, 1956 DOA: 09/06/2023 PCP: Vevelyn Gowers, NP  Brief Narrative/Hospital Course: 68 y.o. male with medical history significant for chronic heart failure with preserved EF, hypertrophic cardiomyopathy, hypertension, non-insulin -dependent type 2 diabetes, hyperlipidemia being admitted to the hospital with several days of lower extremity edema and new onset erythema with concern for acute on chronic diastolic congestive heart failure and left lower extremity cellulitis. His amlodipine  dose was recently reduced to try and control his lower extremity edema, he was also started on as needed and then daily oral Lasix  which she has been taking AND probably gained about 11 pounds over the last few days.  In the ED vitals showed tachycardia, hypertensive, labs with elevated creatinine 1.4 mild chronic anemia Cxr>Streaky lingular opacity, atelectasis versus pneumonia.  Duplex of the leg no evidence of DVT LLE xray left ankle moderate to severe diffuse soft tissue swelling Patient admitted for acute on chronic CHF AKI  Subjective: Seen this am Feels some better LLE still red and color little better Overnight afebrile BP 140s-160s, saturating well on nasal cannula Labs reviewed creatinine about the same 1.4  Assessment and plan:   Acute on chronic HFpEF HCM: Presenting with leg swelling weight gain elevated BNP despite being on Lasix . GDMT:per cardio. Continue diuresis with Lasix  40 mg IV daily Cont to monitor daily I/O,weight, electrolytes and net balance as below.Keep on  salt/fluid restricted diet and monitor in tele. Net IO Since Admission: -1,551.57 mL [09/08/23 1138]    AKI: CKD ruled out as b/l creat 1.1-1.2 gfr >60 in feb. AKI possibly related to fluid overload. Monitor labs while diuresing avoid nephrotoxins, hold ARB today Recent Labs    06/02/23 0948 06/19/23 1412 06/20/23 0651 06/21/23 0433 06/22/23 0950 07/02/23 1718  07/05/23 1044 07/12/23 1130 09/06/23 2307 09/08/23 0348  BUN 16 11 19  41* 40* 23 17 13  24* 26*  CREATININE 1.08 0.98 1.24 1.49* 1.32* 1.29* 1.12 1.09 1.47* 1.43*  CO2 23 25 22 25 24 28 25 24 22  30  K 4.5 3.5 3.7 4.0 4.2 3.4* 4.2 4.4 4.2 3.7    Left lower extremity cellulitis Likely due to swelling edema: Doppler ultrasound has ruled out DVT, x-ray has ruled out deeper infection or any evidence of osteomyelitis Continue empiric Ancef    Elevated troponin: Its is flat, without cardiac symptoms.  Likely demand ischemia in the setting of CHF and AKI   GERD: Cont protonix    HyperlipidemiaL: Cont Crestor   Chronic back pain/slipped disc: Cont pain controlled.  Continue tramadol  Flexeril Requesting nasal calcitonin  Class I Obesity w/ Body mass index is 34.78 kg/m.: Will benefit with PCP follow-up, weight loss,healthy lifestyle and outpatient sleep eval if not done.  DVT prophylaxis: heparin  injection 5,000 Units Start: 09/07/23 2200 Code Status:   Code Status: Full Code Family Communication: plan of care discussed with patient at bedside. Patient status is: Remains hospitalized because of severity of illness Level of care: Telemetry   Dispo: The patient is from: home            Anticipated disposition: TBD Objective: Vitals last 24 hrs: Vitals:   09/07/23 1636 09/07/23 1954 09/08/23 0003 09/08/23 0623  BP: (!) 126/110 (!) 158/90 (!) 167/98 (!) 140/97  Pulse: 99 (!) 106  98  Resp: 16 20 20 18   Temp: 98.2 F (36.8 C) 97.9 F (36.6 C) 98.7 F (37.1 C) 98.2 F (36.8 C)  TempSrc:  Oral Oral Oral  SpO2: 98% 97% 97% 100%  Weight:  Height:        Physical Examination: General exam: alert awake, older than stated age HEENT:Oral mucosa moist, Ear/Nose WNL grossly Respiratory system: Bilaterally diminished BS, no use of accessory muscle Cardiovascular system: S1 & S2 +. Gastrointestinal system: Abdomen soft, NT,ND,BS+ Nervous System: Alert, awake,following  commands. Extremities: LE edema ++, BUT REDNESS more on LLE Skin: No rashes,warm. MSK: Normal muscle bulk/tone.   Data Reviewed: I have personally reviewed following labs and imaging studies ( see epic result tab) CBC: Recent Labs  Lab 09/06/23 2307 09/08/23 0348  WBC 7.2 7.0  NEUTROABS 5.0  --   HGB 12.7* 12.4*  HCT 36.6* 38.3*  MCV 93.8 98.5  PLT 354 290   CMP: Recent Labs  Lab 09/06/23 2307 09/08/23 0348  NA 139 136  K 4.2 3.7  CL 96* 92*  CO2 22 30  GLUCOSE 167* 131*  BUN 24* 26*  CREATININE 1.47* 1.43*  CALCIUM  10.0 9.3   GFR: Estimated Creatinine Clearance: 61.2 mL/min (A) (by C-G formula based on SCr of 1.43 mg/dL (H)). Recent Labs  Lab 09/06/23 2307  AST 23  ALT 36  ALKPHOS 86  BILITOT 0.5  PROT 7.0  ALBUMIN 4.4   No results for input(s): "LIPASE", "AMYLASE" in the last 168 hours. No results for input(s): "AMMONIA" in the last 168 hours. Coagulation Profile: No results for input(s): "INR", "PROTIME" in the last 168 hours. Medications reviewed:  Scheduled Meds:  amLODipine   5 mg Oral Daily   aspirin  EC  81 mg Oral Daily   budeson-glycopyrrolate -formoterol   2 puff Inhalation QHS   dextromethorphan -guaiFENesin   1 tablet Oral BID   furosemide   40 mg Intravenous Daily   heparin   5,000 Units Subcutaneous Q8H   insulin  aspart  0-15 Units Subcutaneous TID WC   insulin  aspart  0-5 Units Subcutaneous QHS   loratadine   10 mg Oral Daily   metoprolol  succinate  150 mg Oral Daily   nicotine   14 mg Transdermal Daily   pantoprazole   40 mg Oral BID   potassium chloride  SA  20 mEq Oral Daily   rosuvastatin   20 mg Oral Daily   Continuous Infusions:   ceFAZolin  (ANCEF ) IV 2 g (09/08/23 1129)    Total time spent in review of labs and imaging, patient evaluation, formulation of plan, documentation and communication with patient/family: 50 minutes  Lesa Rape, MD [facility] 09/08/2023, 11:38 AM

## 2023-09-08 NOTE — Consult Note (Addendum)
 Cardiology Consultation   Patient ID: Joseph Harvey MRN: [id number]; DOB: 09/25/1955  Admit date: 09/06/2023 Date of Consult: 09/08/2023  PCP:  Vevelyn Gowers, NP   [facility] Providers Cardiologist:  Wendie Hamburg, MD  Electrophysiologist:  Lei Pump, MD       Patient Profile:   Joseph Harvey is a 68 y.o. male with a history of hypertrophic cardiomyopathy, hypertension with chronic diastolic CHF, frequent PVCs s/p ablation in 07/2021, hyperlipidemia, type 2 diabetes mellitus, obstructive sleep apnea on CPAP, prostate cancer, and tobacco abuse who is being seen 09/08/2023 for the evaluation of CHF at the request of Dr. Bobbetta Burnet.  History of Present Illness:   [name] is a 68 year old male with the above history who is followed by Dr. Alda Amas and Dr. Lawana Pray. Patient has a history of hypertrophic cardiomyopathy and chronic diastolic CHF. Echo in 03/2020 showed normal biventricular function with severe septal hypertrophy (and otherwise mild concentric hypertrophy), mild intracavitary gradient (peak gradient 30 mmHg), and grade 1 diastolic dysfunction. Coronary calcium  score in 04/2020 was 117 (81st percentile for age and sex).  Myoview  in 04/2020 was low risk with no evidence of ischemia. Cardiac MRI in 06/2020 showed asymmetric hypertrophy measuring up to 20mm in the basal anteroseptum (8mm in posterior wall) as well as patchy LGE in basal septum and RV insertion sites consistent with hypertrophic cardiomyopathy. LGE accounted for 11% of total myocardial mass. Monitor in 03/2021 showed a 14.3% PVC burden and he underwent PVC ablation with Dr. Lawana Pray in 07/2021 with improvement in PVC burden.   Patient was last seen by Dr. Alda Amas in 06/2023 at which time he continued to report chest pain and shortness of breath as well as lower extremity edema. Coronary CTA had previously been ordered but was cancelled due to elevated heart rate. Echo and cardiac PET stress test were  ordered for further evaluation.  Echo in 07/2023 showed LVEF of >75% with moderate LVH of the basal septal segment, grade 1 diastolic dysfunction, and peak LVOT velocity of 2.4 m/s (peak gradient 23 mmHg. Cardiac PET stress test is scheduled for 09/26/2023.   Patient presented to the ED on 09/06/2023 for further evaluation of lower extremity edema and was noted to be volume overloaded on exam. EKG showed sinus tachycardia, rate 124 bpm, with no acute ischemic changes. High-sensitivity troponin minimally elevated and flat at 29 >> 31. Pro-BNP elevated at 524. Chest x-ray showed streaky lingular opacity felt to be atelectasis vs pneumonia. WBC 7.2, Hgb 12.7, Plts 354. Na 139, K 4.2, Glucose 167, BUN 24, Cr 1.47. Anion gap 22. Lower extremity doppler was negative for DVT on the left. He was started on IV Lasix .  Patient reports progressive lower extremity edema for the past several month. He also reports intermittent hyperpigmentation/redness of the left lower extremity.  He applied a cream to this several weeks ago and it went away but then came back.  He then saw his PCP for this about a week or 2 ago and was started on antibiotic for cellulitis.  He states the redness improved with this after about 2 to 3 days but the swelling did not.  Redness has now returned since being in the hospital and looks like cellulitis.  He has chronic dyspnea. It sounds like his dyspnea really waxes and wanes but he does intermittently have shortness of breath at rest and with minimal activity.  He sometimes has dyspnea just walking to the bathroom.  However, he states this improves  when he uses his nebulizer and when he urinates.  He also continues to have atypical chest pain that he describes as a tightness.  This also improves with using inhaler and with urinating.  It is not worse with exertion.  He sleeps on an incline due to chronic back pain and possible orthopnea.  He does report occasional PND.  He denies any palpitations.  He  does report occasional lightheadedness around episodes of severe shortness of breath.  However, no syncope.  He has some chronic nasal congestion due to allergies but no recent fevers or illnesses.  No acute GI symptoms.  No abnormal bleeding in urine or stools.   Past Medical History:  Diagnosis Date   Allergy     Dust, mold, dust mites   Anemia    Anxiety    Asthma    Cancer (HCC)    prostate   Cataract    bilateral repair.   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 Authorizing Provider  acetaminophen  (TYLENOL ) 500 MG tablet Take 500-1,000 mg by mouth every 6 (six) hours as needed for mild pain (pain score 1-3) or moderate pain (pain score 4-6).   Yes [provider]  albuterol  (PROVENTIL ) (2.5 MG/3ML) 0.083% nebulizer solution TAKE 3 MLS BY NEBULIZATION EVERY 4 HOURS AS NEEDED FOR WHEEZING OR SHORTNESS OF BREATH (((PLAN B))) Patient taking differently: Take 3 mLs by nebulization every 4 (four) hours as needed for wheezing or shortness of breath. 07/09/23  Yes Hunsucker, Archer Kobs, MD  albuterol  (VENTOLIN  HFA) [phone] Base) MCG/ACT inhaler Inhale 2 puffs into the lungs  every 4 hours as needed for shortness of breath or wheezing 01/11/23  Yes Newlin, Enobong, MD  amLODipine  (NORVASC ) 5 MG tablet Take 1 tablet (5 mg total) by mouth daily. Patient taking differently: Take 5 mg by mouth in the morning. 07/12/23  Yes Wendie Hamburg, MD  ASPIRIN  81 PO Take 81 mg by mouth daily.   Yes [provider]  Budeson-Glycopyrrol-Formoterol  (BREZTRI  AEROSPHERE) 160-9-4.8 MCG/ACT AERO Inhale 2 puffs into the lungs in the morning and at bedtime. 02/20/23  Yes Hunsucker, Archer Kobs, MD  busPIRone  (BUSPAR ) 10 MG tablet Take 1 tablet (10 mg total) by mouth 3 (three) times daily. Patient taking differently: Take 10 mg by mouth as needed (anxiety). 09/11/20  Yes Lawrance Presume, MD  busPIRone  (BUSPAR ) 5 MG tablet Take 5 mg by mouth as needed.   Yes [provider]  calcitonin, salmon, (MIACALCIN /FORTICAL) 200 UNIT/ACT nasal spray Place 1 spray into alternate  nostrils daily.   Yes [provider]  cephALEXin (KEFLEX) 500 MG capsule Take 500 mg by mouth 4 (four) times daily. 08/30/23  Yes [provider]  Cholecalciferol  (VITAMIN D PO) Take 1 tablet by mouth daily.   Yes [provider]  cyclobenzaprine (FLEXERIL) 10 MG tablet Take 10 mg by mouth with breakfast, with lunch, and with evening meal. 08/12/23  Yes [provider]  dextromethorphan -guaiFENesin  (MUCINEX  DM) 30-600 MG 12hr tablet Take 1 tablet by mouth 2 (two) times daily. Patient taking differently: Take 1 tablet by mouth as needed for cough. 06/24/23  Yes Regalado, Belkys A, MD  doxycycline  (VIBRAMYCIN ) 100 MG capsule Take 1 capsule (100 mg total) by mouth 2 (two) times daily. Patient taking differently: Take 100 mg by mouth as needed. 07/02/23  Yes Felicie Horning, PA-C  Dupilumab  (DUPIXENT ) 300 MG/2ML SOAJ Inject 300 mg into the skin every 14 (fourteen) days. **loading dose completed in clinic on 12/15/22** 05/03/23  Yes Hunsucker, Archer Kobs, MD  ezetimibe  (ZETIA ) 10  MG tablet Take 1 tablet (10 mg total) by mouth daily. Patient taking differently: Take 10 mg by mouth every evening. 01/11/23  Yes Wendie Hamburg, MD  famotidine  (PEPCID ) 20 MG tablet Take 20-40 mg by mouth daily as needed for heartburn or indigestion. 05/14/23  Yes [provider]  furosemide  (LASIX ) 40 MG tablet Take 40 mg daily 07/05/23  Yes Wendie Hamburg, MD  gabapentin  (NEURONTIN ) 300 MG capsule take 1 capsule by mouth in the morning, then take 1 at lunch and 2 at bedtime Patient taking differently: Take 300 mg by mouth daily as needed (Nerve pain). 12/29/20  Yes   ipratropium (ATROVENT ) 0.06 % nasal spray Place 2 sprays into both nostrils 3 (three) times daily. As needed for nasal congestion, runny nose 06/24/23  Yes Regalado, Belkys A, MD  ipratropium-albuterol  (DUONEB) 0.5-2.5 (3) MG/3ML SOLN INHALE 1 VIAL VIA NEBULIZER TWICE A DAY 06/24/23  Yes Regalado, Belkys A, MD  LINZESS  145 MCG CAPS capsule Take 145 mcg by mouth daily. 08/18/23  Yes [provider]  MAGnesium -Oxide 400 (240 Mg) MG tablet Take 1 tablet (400 mg total) by mouth daily. 07/07/23  Yes Wendie Hamburg, MD  metFORMIN  (GLUCOPHAGE -XR) 500 MG 24 hr tablet Take 2 tablets (1,000 mg total) by mouth in the morning AND 1 tablet (500 mg total) every evening with meals. 01/24/23  Yes Lawrance Presume, MD  metoprolol  succinate (TOPROL -XL) 100 MG 24 hr tablet Take 1 tablet (100 mg total) by mouth daily. Take with or immediately following a meal. Patient taking differently: Take 100 mg by mouth daily. Take with 50 mg tablet to equal 150 mg 05/29/23 09/07/23 Yes Debria Fang, PA-C  metoprolol  succinate (TOPROL -XL) 50 MG 24 hr tablet Take 1 tablet (50 mg total) by mouth daily. Take with or immediately following a meal. Patient taking differently: Take 50 mg by mouth daily. Take with 100 mg tablet to equal 150 mg 05/25/23 09/07/23 Yes Debria Fang, PA-C  montelukast  (SINGULAIR ) 10 MG tablet Take 1  tablet (10 mg total) by mouth at bedtime. Patient taking differently: Take 10 mg by mouth at bedtime as needed (allergies). 01/11/22  Yes Lawrance Presume, MD  nicotine  (NICODERM CQ  - DOSED IN MG/24 HR) 7 mg/24hr patch Place 1 patch (7 mg total) onto the skin daily. 06/24/23  Yes Regalado, Belkys A, MD  omeprazole (PRILOSEC) 40 MG capsule Take 40 mg by mouth as needed. 07/21/23  Yes [provider]  OXYGEN Inhale 3 L into the lungs See admin instructions. Inhale 3L when needed unless overexertion, then use continuously.   Yes [provider]  pantoprazole  (PROTONIX ) 40 MG tablet Take 1 tablet (40 mg total) by mouth 2 (two) times daily. Patient taking differently: Take 40 mg by mouth as needed. 06/24/23  Yes Regalado, Belkys A, MD  potassium chloride  SA (KLOR-CON  M) 20 MEQ tablet Take 1 tablet (20 mEq total) by mouth daily. 07/05/23  Yes Wendie Hamburg, MD  predniSONE  (DELTASONE ) 10 MG tablet Take 30 mg by mouth daily as needed. 07/03/23  Yes [provider]  rosuvastatin  (CRESTOR ) 20 MG tablet Take 1 tablet (20 mg total) by mouth daily. 02/02/22  Yes Wendie Hamburg, MD  silver  sulfADIAZINE  (SILVADENE ) 1 % cream Apply twice a day to affected area Patient taking differently: Apply 1 Application topically as needed (rash). 10/14/22  Yes Lawrance Presume, MD  tiZANidine  (ZANAFLEX ) 4 MG tablet Take 4 mg by mouth 3 (three) times daily as needed for muscle spasms. 07/07/23  Yes [provider]  traMADol  (ULTRAM ) 50 MG tablet Take 1 tablet (50 mg total) by mouth every evening. Each prescription to last 1 mth Patient taking differently: Take 50 mg by mouth every evening. 09/28/22  Yes Lawrance Presume, MD  valsartan  (DIOVAN ) 320 MG tablet Take 1 tablet (320 mg total) by mouth daily. 10/05/22 10/25/23 Yes Wendie Hamburg, MD  glucose blood (TRUE METRIX BLOOD GLUCOSE TEST) test strip Use as instructed 01/07/19   Lawrance Presume, MD  TRUEplus Lancets 28G  MISC Use as directed 01/07/19   Lawrance Presume, MD    Inpatient Medications: Scheduled Meds:  amLODipine   5 mg Oral Daily   aspirin  EC  81 mg Oral Daily   budeson-glycopyrrolate -formoterol   2 puff Inhalation QHS   dextromethorphan -guaiFENesin   1 tablet Oral BID   furosemide   40 mg Intravenous Daily   heparin   5,000 Units Subcutaneous Q8H   insulin  aspart  0-15 Units Subcutaneous TID WC   insulin  aspart  0-5 Units Subcutaneous QHS   loratadine   10 mg Oral Daily   metoprolol  succinate  150 mg Oral Daily   nicotine   14 mg Transdermal Daily   pantoprazole   40 mg Oral BID   potassium chloride  SA  20 mEq Oral Daily   rosuvastatin   20 mg Oral Daily   Continuous Infusions:   ceFAZolin  (ANCEF ) IV 2 g (09/08/23 1129)   PRN Meds: acetaminophen  **OR** acetaminophen , gabapentin , ipratropium-albuterol , montelukast , ondansetron  **OR** ondansetron  (ZOFRAN ) IV, oxyCODONE , tiZANidine , traZODone   Allergies:    Allergies  Allergen Reactions   Lisinopril Shortness Of Breath   Molds & Smuts Anaphylaxis   Dust Mite Extract     Social History:   Social History   Socioeconomic History   Marital status: Single    Spouse name: Not on file   Number of children: 2   Years of education: 12 grade   Highest education level: Not on file  Occupational History   Occupation: Facilities manager: [employer]  Tobacco Use   Smoking status: Every Day    Current packs/day: 0.75    Average packs/day: 0.7 packs/day for 49.3 years (37.0 ttl pk-yrs)    Types: Cigarettes    Start date: 1976   Smokeless tobacco: Never   Tobacco comments:    still smoking 0.5 ppd  Vaping Use   Vaping status: Never Used  Substance and Sexual Activity   Alcohol use:  Not Currently    Alcohol/week: 3.0 standard drinks of alcohol    Types: 3 Cans of beer per week   Drug use: No   Sexual activity: Not on file  Other Topics Concern   Not on file  Social History Narrative   Not on file   Social Drivers of Health    Financial Resource Strain: Not on file  Food Insecurity: No Food Insecurity (09/07/2023)   Hunger Vital Sign    Worried About Running Out of Food in the Last Year: Never true    Ran Out of Food in the Last Year: Never true  Transportation Needs: No Transportation Needs (09/07/2023)   PRAPARE - Administrator, Civil Service (Medical): No    Lack of Transportation (Non-Medical): No  Physical Activity: Not on file  Stress: No Stress Concern Present (05/26/2020)   [employer] of Occupational Health - Occupational Stress Questionnaire    Feeling of Stress : Only a little  Social Connections: Unknown (09/07/2023)   Social Connection and Isolation Panel [NHANES]    Frequency of Communication with Friends and Family: Three times a week    Frequency of Social Gatherings with Friends and Family: Twice a week    Attends Religious Services: Patient declined    Database administrator or Organizations: No    Attends Banker Meetings: Never    Marital Status: Never married  Intimate Partner Violence: Not At Risk (09/07/2023)   Humiliation, Afraid, Rape, and Kick questionnaire    Fear of Current or Ex-Partner: No    Emotionally Abused: No    Physically Abused: No    Sexually Abused: No    Family History:   Family History  Problem Relation Age of Onset   Colon cancer Father    Lung cancer Father        was a smoker   Prostate cancer Father    Lung cancer Mother        was a smoker   Asthma Mother    Allergies Brother    Diabetes Maternal Grandmother    Colon polyps Neg Hx    Esophageal cancer Neg Hx    Stomach cancer Neg Hx    Rectal cancer Neg Hx      ROS:  Please see the history of present illness.   Physical Exam/Data:   Vitals:   09/07/23 1636 09/07/23 1954 09/08/23 0003 09/08/23 0623  BP: (!) 126/110 (!) 158/90 (!) 167/98 (!) 140/97  Pulse: 99 (!) 106  98  Resp: 16 20 20 18   Temp: 98.2 F (36.8 C) 97.9 F (36.6 C) 98.7 F (37.1 C) 98.2 F  (36.8 C)  TempSrc:  Oral Oral Oral  SpO2: 98% 97% 97% 100%  Weight:      Height:        Intake/Output Summary (Last 24 hours) at 09/08/2023 1133 Last data filed at 09/08/2023 0000 Gross per 24 hour  Intake --  Output 1000 ml  Net -1000 ml      09/07/2023    1:12 PM 09/07/2023   12:18 AM 09/06/2023   11:03 PM  Last 3 Weights  Weight (lbs) 235 lb 8 oz 256 lb 13.4 oz 240 lb  Weight (kg) 106.822 kg 116.5 kg 108.863 kg     Body mass index is 34.78 kg/m.  General: 68 y.o. obese male resting comfortably in no acute distress. HEENT: Normocephalic and atraumatic.  Neck: Supple. JVD difficult to assess due to body habitus.  Heart: Tachycardic with regular rhythm. No murmurs, gallops, or rubs. lly. Lungs: No increased work of breathing. Infrequent scattered rhonchi. Otherwise, clear to ausculation bilaterally.  Abdomen: Distended but soft and non-tender to palpation.  Extremities: 3+ pitting edema of bilateral lower extremities (left > right).    Skin: Warm and dry. Erythema of left lower extremity extending at least half way up shin. Mildly warm to the touch. Tender to palpation.  Neuro: Alert and oriented x3. No focal deficits. Psych: Normal affect. Responds appropriately.   EKG:  The EKG was personally reviewed and demonstrates:  Sinus tachycardia, rate 124 bpm, with no acute ischemic changes. Telemetry:  Telemetry was personally reviewed and demonstrates:  Sinus tachycardia  with rates mostly in the 100s to 110s (occasionally 120s). Frequent PVCs. He had one brief episode that looked like possible atrial fibrillation vs SVT.  Relevant CV Studies:  Echocardiogram 08/04/2023: Impressions: 1. Peak LVOT velocity 2.4 m/s, PG 23 mmHg. Left ventricular ejection  fraction, by estimation, is >75%. The left ventricle has hyperdynamic  function. The left ventricle has no regional wall motion abnormalities.  There is moderate left ventricular  hypertrophy of the basal-septal segment. Left  ventricular diastolic  parameters are consistent with Grade I diastolic dysfunction (impaired  relaxation).   2. Right ventricular systolic function is normal. The right ventricular  size is normal.   3. The mitral valve is normal in structure. No evidence of mitral valve  regurgitation. No evidence of mitral stenosis.   4. The aortic valve is normal in structure. Aortic valve regurgitation is  not visualized. No aortic stenosis is present.   5. The inferior vena cava is normal in size with greater than 50%  respiratory variability, suggesting right atrial pressure of 3 mmHg.   Conclusion(s)/Recommendation(s): Findings consistent with hypertrophic  cardiomyopathy.   Laboratory Data:  High Sensitivity Troponin:  No results for input(s): "TROPONINIHS" in the last 720 hours.   Chemistry Recent Labs  Lab 09/06/23 2307 09/08/23 0348  NA 139 136  K 4.2 3.7  CL 96* 92*  CO2 22 30  GLUCOSE 167* 131*  BUN 24* 26*  CREATININE 1.47* 1.43*  CALCIUM  10.0 9.3  GFRNONAA 52* 54*  ANIONGAP 22* 14    Recent Labs  Lab 09/06/23 2307  PROT 7.0  ALBUMIN 4.4  AST 23  ALT 36  ALKPHOS 86  BILITOT 0.5   Lipids No results for input(s): "CHOL", "TRIG", "HDL", "LABVLDL", "LDLCALC", "CHOLHDL" in the last 168 hours.  Hematology Recent Labs  Lab 09/06/23 2307 09/08/23 0348  WBC 7.2 7.0  RBC 3.90* 3.89*  HGB 12.7* 12.4*  HCT 36.6* 38.3*  MCV 93.8 98.5  MCH 32.6 31.9  MCHC 34.7 32.4  RDW 13.5 13.3  PLT 354 290   Thyroid No results for input(s): "TSH", "FREET4" in the last 168 hours.  BNP Recent Labs  Lab 09/06/23 2307  PROBNP 524.0*    DDimer No results for input(s): "DDIMER" in the last 168 hours.   Radiology/Studies:  DG Ankle Complete Left Result Date: 09/07/2023 CLINICAL DATA:  Atraumatic left ankle pain and swelling. EXAM: LEFT ANKLE COMPLETE - 3+ VIEW COMPARISON:  None Available. FINDINGS: There is no evidence of fracture, dislocation, or joint effusion. There is no  evidence of arthropathy or other focal bone abnormality. Marked severity diffuse soft tissue swelling is seen. IMPRESSION: Marked severity diffuse soft tissue swelling without evidence of an acute osseous abnormality. Electronically Signed   By: Virgle Grime M.D.   On: 09/07/2023 02:55  US  Venous Img Lower  Left (DVT Study) Result Date: 09/07/2023 CLINICAL DATA:  Left lower leg swelling, pain and redness. EXAM: LEFT LOWER EXTREMITY VENOUS DOPPLER ULTRASOUND TECHNIQUE: Gray-scale sonography with graded compression, as well as color Doppler and duplex ultrasound were performed to evaluate the lower extremity deep venous systems from the level of the common femoral vein and including the common femoral, femoral, profunda femoral, popliteal and calf veins including the posterior tibial, peroneal and gastrocnemius veins when visible. The superficial great saphenous vein was also interrogated. Spectral Doppler was utilized to evaluate flow at rest and with distal augmentation maneuvers in the common femoral, femoral and popliteal veins. COMPARISON:  None Available. FINDINGS: Contralateral Common Femoral Vein: Respiratory phasicity is normal and symmetric with the symptomatic side. No evidence of thrombus. Normal compressibility. Common Femoral Vein: No evidence of thrombus. Normal compressibility, respiratory phasicity and response to augmentation. Saphenofemoral Junction: No evidence of thrombus. Normal compressibility and flow on color Doppler imaging. Profunda Femoral Vein: No evidence of thrombus. Normal compressibility and flow on color Doppler imaging. Femoral Vein: No evidence of thrombus. Normal compressibility, respiratory phasicity and response to augmentation. Popliteal Vein: No evidence of thrombus. Normal compressibility, respiratory phasicity and response to augmentation. Calf Veins: No evidence of thrombus. Normal compressibility and flow on color Doppler imaging. Superficial Great Saphenous Vein:  No evidence of thrombus. Normal compressibility. Venous Reflux:  None. Other Findings:  None. IMPRESSION: No evidence of deep venous thrombosis within the LEFT lower extremity. Electronically Signed   By: Virgle Grime M.D.   On: 09/07/2023 01:43   DG Chest 2 View Result Date: 09/06/2023 CLINICAL DATA:  Shortness of breath EXAM: CHEST - 2 VIEW COMPARISON:  07/02/2023 FINDINGS: Streaky lingular opacity.  Normal cardiac size.  No pneumothorax IMPRESSION: Streaky lingular opacity, atelectasis versus pneumonia. Electronically Signed   By: Esmeralda Hedge M.D.   On: 09/06/2023 23:47     Assessment and Plan:   Acute on Chronic Diastolic CHF Hypertrophic Cardiomyopathy Patient has a history of hypertrophic cardiomyopathy and diastolic CHF.  Last Echo in E/2025 showed LVEF of >75% with moderate LVH of the basal septal segment, grade 1 diastolic dysfunction, and peak LVOT velocity of 2.4 m/s (peak gradient 23 mmHg. Cardiac PET stress test is scheduled for 09/26/2023.  He presented to the ED for worsening lower extremity edema was noted to be volume overloaded. He reported an 11 lbs weight gain since OV in 07/2023. Pro-BNP elevated at 524. Chest x-ray showed streaky lingular opacity felt to be atelectasis vs pneumonia.  He was started on IV Lasix . Net negative 1.5 L so far. Renal function stable. Not sure if weights are accurate. - Still looks volume overloaded.  - We need to be careful with diuresis keeping his hypertrophic cardiomyopathy.  However, I think he needs more diuresis.  Can increase IV Lasix  to 40 mg twice daily  -Will increase Toprol -XL to 100mg  twice daily.  - Continue to monitor daily weights, strict I/Os, and renal function.   Left Lower Extremity Cellulitis Lower extremity doppler negative for DVT. X-ray showed marked severity diffuse soft tissue swelling but evidence of acute osseous abnormality. - Continue antibiotic per primary team.  Sinus Tachycardia Frequent PVC History of  frequent PVCs s/p ablation in 2023. Also has a higher heart rate at baseline and is frequently in the 90s in the office. He is tachycardic here with rates mostly in the 100s to 110s but occasionally as high as the 120s. There was one brief run of a  narrowing complex tachycardia that was irregularly - suspect SVT/ PAT. Prior monitors have shown short runs of NSVT and SVT. - Suspect underlying infection is contributing to tachycardia. - Asymptomatic. - Potassium 3.7 today. Will replete.  - Will check Magnesium .  - Will increase Toprol -XL as above. - Continue to monitor on telemetry.   Hypertension BP mildly elevated.  - Will increase Toprol -XL to 100mg  twice daily.  - Continue Amlodipine  5mg  daily. Could consider stopping this and changing to Cardizem  for additional rate control in setting of hypertrophic cardiomyopathy.  - Home Valsartan  on hold due to AKI.  Hyperlipidemia - Continue Crestor  20mg  daily.  AKI Baseline creatinine around 1.0 to 1.1. Creatinine 1.47 on admission and 1.43 today.  - Continue to hold Valsartan  for now. - Continue to monitor closely with ongoing diuresis.   Otherwise, per primary team: - Type 2 diabetes mellitus - Hyperlipidemia - GERD - Chronic back pain    Risk Assessment/Risk Scores:    [place]  Heart Association (NYHA) Functional Class NYHA Class III   For questions or updates, please contact [facility] Please consult [email or web address] for contact info under    Signed, Callie E Goodrich, PA-C  09/08/2023 11:33 AM  Patient seen and examined.  Agree with above documentation.  Mr. Heckmann is a 68 year old male with a history of hypertrophic cardiomyopathy, chronic diastolic heart failure, frequent PVCs status post ablation, hypertension, T2DM, OSA, tobacco use who we are consulted by Dr. Bobbetta Burnet for evaluation of heart failure.  Patient is well-known to me.  I last saw him in the clinic in February.  Most recent echo 07/2023 showed hyperdynamic LV  function, peak LVOT gradient 23 mmHg.  He had reported some chest pain and cardiac stress PET scheduled for 09/26/2023.  He presented to ED yesterday with worsening lower extremity edema.  Labs notable for creatinine 1.47, sodium 139, potassium 4.2, hemoglobin 12.7, proBNP 524, troponin 29 > 31.  EKG showed sinus tachycardia, rate 124, low voltage.  Chest x-ray showed lingular opacity, atelectasis versus pneumonia.  Lower extremity duplex showed no DVT.  On exam, patient is alert and oriented, regular rate and rhythm, no murmurs, lungs CTAB, 2+ BLE edema, erythema/tenderness/warmth over left leg.  He appears volume overloaded on exam.  Will increase IV lasix  to 40 mg BID. Closely monitor renal function with diuresis.   Wendie Hamburg, MD

## 2023-09-08 NOTE — Hospital Course (Addendum)
 Brief Narrative/Hospital Course: 68 y.o. male with medical history significant for chronic heart failure with preserved EF, hypertrophic cardiomyopathy, hypertension, non-insulin -dependent type 2 diabetes, hyperlipidemia being admitted to the hospital with several days of lower extremity edema and new onset erythema with concern for acute on chronic diastolic congestive heart failure and left lower extremity cellulitis. His amlodipine  dose was recently reduced to try and control his lower extremity edema, he was also started on as needed and then daily oral Lasix  which she has been taking AND probably gained about 11 pounds over the last few days.  In the ED vitals showed tachycardia, hypertensive, labs with elevated creatinine 1.4 mild chronic anemia Cxr>Streaky lingular opacity, atelectasis versus pneumonia.  Duplex of the leg no evidence of DVT LLE xray left ankle moderate to severe diffuse soft tissue swelling Patient admitted for acute on chronic CHF AKI  Subjective: Seen and examined Reports leg less painful slightly less edematous Still has significant redness on the left lower extremity Noticed blister overnight on LLE Overnight afebrile BP has improved in 150s, on room air Creatinine down to 1.3  Assessment and plan:   Acute on chronic HFpEF Hypertrophic cardiomyopathy -had cMRI in 06/2020 on metoprolol : Presenting with leg swelling weight gain elevated BNP despite being on Lasix . GDMT:per cardio-metoprolol  increased, also increasing IV diuresis-will need slower diuresis to avoid overdiuresis which may worsen obstructive physiology. Cont to monitor daily I/O,weight, electrolytes and net balance as below.Keep on  salt/fluid restricted diet and monitor in tele. Overall net negative as Net IO Since Admission: -3,251.57 mL [09/09/23 1117]    Left lower extremity cellulitis: Likely due to swelling edema,Doppler ultrasound has ruled out DVT, x-ray has ruled out deeper infection or any  evidence of osteomyelitis Continue empiric Ancef , blister overnight noticed, check MRSA- add vanco if + Cont to elevate leg.  Continue diuresis as above If does not improve next 1 to 2 days will likely need CT imaging or ID evaluation.   AKI: CKD ruled out as b/l creat 1.1-1.2 gfr >60 in feb. AKI possibly related to fluid overload. Remains stable downtrending monitor closely while on diuresis.  Avoid nephrotoxic medication holding ARB's Recent Labs    06/19/23 1412 06/20/23 0651 06/21/23 0433 06/22/23 0950 07/02/23 1718 07/05/23 1044 07/12/23 1130 09/06/23 2307 09/08/23 0348 09/09/23 0401  BUN 11 19 41* 40* 23 17 13  24* 26* 26*  CREATININE 0.98 1.24 1.49* 1.32* 1.29* 1.12 1.09 1.47* 1.43* 1.30*  CO2 25 22 25 24 28 25 24 22 30  26  K 3.5 3.7 4.0 4.2 3.4* 4.2 4.4 4.2 3.7 4.2   Sinus tachycardia Frequent PVC Elevated troponin: trops flat, without cardiac symptoms.  Likely demand ischemia in the setting of CHF and AKI.  Monitor lites due to NSVT/SVT continue Toprol  monitoring daily   GERD: Cont protonix    HyperlipidemiaL: Cont Crestor   Chronic back pain/slipped disc: Cont pain controlled.  Continue tramadol  Flexeril Requesting nasal calcitonin  Class I Obesity w/ Body mass index is 34.78 kg/m.: Will benefit with PCP follow-up, weight loss,healthy lifestyle and outpatient sleep eval if not done.  DVT prophylaxis: heparin  injection 5,000 Units Start: 09/07/23 2200 Code Status:   Code Status: Full Code Family Communication: plan of care discussed with patient at bedside. Patient status is: Remains hospitalized because of severity of illness Level of care: Telemetry   Dispo: The patient is from: home            Anticipated disposition: TBD

## 2023-09-08 NOTE — Plan of Care (Signed)
   Problem: Education: Goal: Knowledge of General Education information will improve Description: Including pain rating scale, medication(s)/side effects and non-pharmacologic comfort measures Outcome: Progressing   Problem: Clinical Measurements: Goal: Will remain free from infection Outcome: Progressing Goal: Diagnostic test results will improve Outcome: Progressing

## 2023-09-09 DIAGNOSIS — N179 Acute kidney failure, unspecified: Secondary | ICD-10-CM | POA: Diagnosis not present

## 2023-09-09 DIAGNOSIS — R000 Tachycardia, unspecified: Secondary | ICD-10-CM

## 2023-09-09 DIAGNOSIS — I5033 Acute on chronic diastolic (congestive) heart failure: Secondary | ICD-10-CM | POA: Diagnosis not present

## 2023-09-09 DIAGNOSIS — I2510 Atherosclerotic heart disease of native coronary artery without angina pectoris: Secondary | ICD-10-CM | POA: Diagnosis not present

## 2023-09-09 DIAGNOSIS — I10 Essential (primary) hypertension: Secondary | ICD-10-CM

## 2023-09-09 DIAGNOSIS — I5031 Acute diastolic (congestive) heart failure: Secondary | ICD-10-CM

## 2023-09-09 DIAGNOSIS — I422 Other hypertrophic cardiomyopathy: Secondary | ICD-10-CM | POA: Diagnosis not present

## 2023-09-09 DIAGNOSIS — E782 Mixed hyperlipidemia: Secondary | ICD-10-CM

## 2023-09-09 LAB — BASIC METABOLIC PANEL WITH GFR
Anion gap: 14 (ref 5–15)
BUN: 26 mg/dL — ABNORMAL HIGH (ref 8–23)
CO2: 26 mmol/L (ref 22–32)
Calcium: 9 mg/dL (ref 8.9–10.3)
Chloride: 93 mmol/L — ABNORMAL LOW (ref 98–111)
Creatinine, Ser: 1.3 mg/dL — ABNORMAL HIGH (ref 0.61–1.24)
GFR, Estimated: >60 mL/min (ref >60)
Glucose, Bld: 270 mg/dL — ABNORMAL HIGH (ref 70–99)
Potassium: 4.2 mmol/L (ref 3.5–5.1)
Sodium: 133 mmol/L — ABNORMAL LOW (ref 135–145)

## 2023-09-09 LAB — GLUCOSE, CAPILLARY
Glucose-Capillary: 218 mg/dL — ABNORMAL HIGH (ref 70–99)
Glucose-Capillary: 300 mg/dL — ABNORMAL HIGH (ref 70–99)
Glucose-Capillary: 245 mg/dL — ABNORMAL HIGH (ref 70–99)
Glucose-Capillary: 184 mg/dL — ABNORMAL HIGH (ref 70–99)

## 2023-09-09 LAB — MRSA NEXT GEN BY PCR, NASAL: MRSA by PCR Next Gen: NOT DETECTED

## 2023-09-09 LAB — MAGNESIUM: Magnesium: 2.1 mg/dL (ref 1.7–2.4)

## 2023-09-09 MED ORDER — ORAL CARE MOUTH RINSE: 15.0000 mL | OROMUCOSAL | Status: DC | PRN

## 2023-09-09 MED ORDER — BUSPIRONE HCL 10 MG PO TABS: 10.0000 mg | ORAL_TABLET | ORAL | Status: DC | PRN

## 2023-09-09 MED ORDER — FAMOTIDINE 20 MG PO TABS: 20.0000 mg | ORAL_TABLET | Freq: Every day | ORAL | Status: DC | PRN

## 2023-09-09 MED ORDER — LINACLOTIDE 145 MCG PO CAPS
145.0000 ug | ORAL_CAPSULE | Freq: Every day | ORAL | Status: DC
  Administered 2023-09-10 – 2023-09-14 (×4): 145 ug via ORAL
  Filled 2023-09-09 (×6): qty 1

## 2023-09-09 MED ORDER — BUSPIRONE HCL 10 MG PO TABS
10.0000 mg | ORAL_TABLET | Freq: Every day | ORAL | Status: DC | PRN
  Administered 2023-09-11 – 2023-09-13 (×3): 10 mg via ORAL
  Filled 2023-09-09 (×3): qty 1

## 2023-09-09 MED ORDER — MONTELUKAST SODIUM 10 MG PO TABS: 10.0000 mg | ORAL_TABLET | Freq: Every evening | ORAL | Status: DC | PRN

## 2023-09-09 NOTE — Progress Notes (Signed)
 PROGRESS NOTE Joseph Harvey  GMW:[id number] DOB: Aug 25, 1955 DOA: 09/06/2023 PCP: Vevelyn Gowers, NP  Brief Narrative/Hospital Course: Brief Narrative/Hospital Course: 68 y.o. male with medical history significant for chronic heart failure with preserved EF, hypertrophic cardiomyopathy, hypertension, non-insulin -dependent type 2 diabetes, hyperlipidemia being admitted to the hospital with several days of lower extremity edema and new onset erythema with concern for acute on chronic diastolic congestive heart failure and left lower extremity cellulitis. His amlodipine  dose was recently reduced to try and control his lower extremity edema, he was also started on as needed and then daily oral Lasix  which she has been taking AND probably gained about 11 pounds over the last few days.  In the ED vitals showed tachycardia, hypertensive, labs with elevated creatinine 1.4 mild chronic anemia Cxr>Streaky lingular opacity, atelectasis versus pneumonia.  Duplex of the leg no evidence of DVT LLE xray left ankle moderate to severe diffuse soft tissue swelling Patient admitted for acute on chronic CHF AKI  Subjective: Seen and examined Reports leg less painful slightly less edematous Still has significant redness on the left lower extremity Noticed blister overnight on LLE Overnight afebrile BP has improved in 150s, on room air Creatinine down to 1.3  Assessment and plan:   Acute on chronic HFpEF Hypertrophic cardiomyopathy -had cMRI in 06/2020 on metoprolol : Presenting with leg swelling weight gain elevated BNP despite being on Lasix . GDMT:per cardio-metoprolol  increased, also increasing IV diuresis-will need slower diuresis to avoid overdiuresis which may worsen obstructive physiology. Cont to monitor daily I/O,weight, electrolytes and net balance as below.Keep on  salt/fluid restricted diet and monitor in tele. Overall net negative as Net IO Since Admission: -3,251.57 mL [09/09/23 1117]    Left lower  extremity cellulitis: Likely due to swelling edema,Doppler ultrasound has ruled out DVT, x-ray has ruled out deeper infection or any evidence of osteomyelitis Continue empiric Ancef , blister overnight noticed, check MRSA- add vanco if + Cont to elevate leg.  Continue diuresis as above If does not improve next 1 to 2 days will likely need CT imaging or ID evaluation.   AKI: CKD ruled out as b/l creat 1.1-1.2 gfr >60 in feb. AKI possibly related to fluid overload. Remains stable downtrending monitor closely while on diuresis.  Avoid nephrotoxic medication holding ARB's Recent Labs    06/19/23 1412 06/20/23 0651 06/21/23 0433 06/22/23 0950 07/02/23 1718 07/05/23 1044 07/12/23 1130 09/06/23 2307 09/08/23 0348 09/09/23 0401  BUN 11 19 41* 40* 23 17 13  24* 26* 26*  CREATININE 0.98 1.24 1.49* 1.32* 1.29* 1.12 1.09 1.47* 1.43* 1.30*  CO2 25 22 25 24 28 25 24 22 30  26  K 3.5 3.7 4.0 4.2 3.4* 4.2 4.4 4.2 3.7 4.2   Sinus tachycardia Frequent PVC Elevated troponin: trops flat, without cardiac symptoms.  Likely demand ischemia in the setting of CHF and AKI.  Monitor lites due to NSVT/SVT continue Toprol  monitoring daily   GERD: Cont protonix    HyperlipidemiaL: Cont Crestor   Chronic back pain/slipped disc: Cont pain controlled.  Continue tramadol  Flexeril Requesting nasal calcitonin  Class I Obesity w/ Body mass index is 34.78 kg/m.: Will benefit with PCP follow-up, weight loss,healthy lifestyle and outpatient sleep eval if not done.  DVT prophylaxis: heparin  injection 5,000 Units Start: 09/07/23 2200 Code Status:   Code Status: Full Code Family Communication: plan of care discussed with patient at bedside. Patient status is: Remains hospitalized because of severity of illness Level of care: Telemetry   Dispo: The patient is from: home  Anticipated disposition: TBD    DVT prophylaxis: heparin  injection 5,000 Units Start: 09/07/23 2200 Code Status:   Code Status:  Full Code Family Communication: plan of care discussed with patient at bedside. Patient status is: Remains hospitalized because of severity of illness Level of care: Telemetry   Dispo: The patient is from: home            Anticipated disposition: TBD Objective: Vitals last 24 hrs: Vitals:   09/08/23 2057 09/08/23 2059 09/09/23 0507 09/09/23 0913  BP:  (!) 150/74 (!) 153/81 (!) 155/93  Pulse:  95 (!) 101 (!) 103  Resp:  20 18   Temp:  98 F (36.7 C) 98.6 F (37 C)   TempSrc:  Oral Oral   SpO2: 92% 96% 97%   Weight:      Height:        Physical Examination: General exam: alert awake, older than stated age HEENT:Oral mucosa moist, Ear/Nose WNL grossly Respiratory system: Bilaterally diminished BS, no use of accessory muscle Cardiovascular system: S1 & S2 +. Gastrointestinal system: Abdomen soft, obese,NT,ND,BS+ Nervous System: Alert, awake,following commands. Extremities: LE edema ++ w/ redness tenderness LLE w/ redness has not progressed beyond the demarcated line, one blister+ on LLE Skin: No rashes,warm. MSK: Normal muscle bulk/tone.   Data Reviewed: I have personally reviewed following labs and imaging studies ( see epic result tab) CBC: Recent Labs  Lab 09/06/23 2307 09/08/23 0348  WBC 7.2 7.0  NEUTROABS 5.0  --   HGB 12.7* 12.4*  HCT 36.6* 38.3*  MCV 93.8 98.5  PLT 354 290   CMP: Recent Labs  Lab 09/06/23 2307 09/08/23 0348 09/09/23 0401  NA 139 136 133*  K 4.2 3.7 4.2  CL 96* 92* 93*  CO2 22 30 26   GLUCOSE 167* 131* 270*  BUN 24* 26* 26*  CREATININE 1.47* 1.43* 1.30*  CALCIUM  10.0 9.3 9.0  MG  --   --  2.1   GFR: Estimated Creatinine Clearance: 67.3 mL/min (A) (by C-G formula based on SCr of 1.3 mg/dL (H)). Recent Labs  Lab 09/06/23 2307  AST 23  ALT 36  ALKPHOS 86  BILITOT 0.5  PROT 7.0  ALBUMIN 4.4   No results for input(s): "LIPASE", "AMYLASE" in the last 168 hours. No results for input(s): "AMMONIA" in the last 168 hours. Coagulation  Profile: No results for input(s): "INR", "PROTIME" in the last 168 hours. Medications reviewed:  Scheduled Meds:  amLODipine   5 mg Oral Daily   aspirin  EC  81 mg Oral Daily   budeson-glycopyrrolate -formoterol   2 puff Inhalation QHS   calcitonin (salmon)  1 spray Alternating Nares Daily   cholecalciferol   400 Units Oral Daily   dextromethorphan -guaiFENesin   1 tablet Oral BID   furosemide   40 mg Intravenous BID   heparin   5,000 Units Subcutaneous Q8H   insulin  aspart  0-15 Units Subcutaneous TID WC   insulin  aspart  0-5 Units Subcutaneous QHS   ipratropium-albuterol   3 mL Nebulization BID   loratadine   10 mg Oral Daily   metoprolol  succinate  100 mg Oral BID   nicotine   14 mg Transdermal Daily   pantoprazole   40 mg Oral BID   potassium chloride  SA  20 mEq Oral Daily   rosuvastatin   20 mg Oral Daily   Continuous Infusions:   ceFAZolin  (ANCEF ) IV 2 g (09/09/23 1113)    Total time spent in review of labs and imaging, patient evaluation, formulation of plan, documentation and communication with patient/family: 50 minutes  Lesa Rape, MD [facility] 09/09/2023, 11:17 AM

## 2023-09-09 NOTE — Progress Notes (Signed)
 Rounding Note    Patient Name: Joseph Harvey Date of Encounter: 09/09/2023  [facility] Cardiologist: Wendie Hamburg, MD  Chief Complaint: Lower extremity swelling Reason of consult: CHF  Subjective   No anginal chest pain. Significantly volume up.  Inpatient Medications    Scheduled Meds:  amLODipine   5 mg Oral Daily   aspirin  EC  81 mg Oral Daily   budeson-glycopyrrolate -formoterol   2 puff Inhalation QHS   calcitonin (salmon)  1 spray Alternating Nares Daily   cholecalciferol   400 Units Oral Daily   dextromethorphan -guaiFENesin   1 tablet Oral BID   furosemide   40 mg Intravenous BID   heparin   5,000 Units Subcutaneous Q8H   insulin  aspart  0-15 Units Subcutaneous TID WC   insulin  aspart  0-5 Units Subcutaneous QHS   ipratropium-albuterol   3 mL Nebulization BID   loratadine   10 mg Oral Daily   metoprolol  succinate  100 mg Oral BID   nicotine   14 mg Transdermal Daily   pantoprazole   40 mg Oral BID   potassium chloride  SA  20 mEq Oral Daily   rosuvastatin   20 mg Oral Daily   Continuous Infusions:   ceFAZolin  (ANCEF ) IV 2 g (09/09/23 0327)   PRN Meds: acetaminophen  **OR** acetaminophen , cyclobenzaprine, gabapentin , ipratropium-albuterol , montelukast , ondansetron  **OR** ondansetron  (ZOFRAN ) IV, traMADol , traZODone    Vital Signs    Vitals:   09/08/23 2057 09/08/23 2059 09/09/23 0507 09/09/23 0913  BP:  (!) 150/74 (!) 153/81 (!) 155/93  Pulse:  95 (!) 101 (!) 103  Resp:  20 18   Temp:  98 F (36.7 C) 98.6 F (37 C)   TempSrc:  Oral Oral   SpO2: 92% 96% 97%   Weight:      Height:        Intake/Output Summary (Last 24 hours) at 09/09/2023 1044 Last data filed at 09/09/2023 4098 Gross per 24 hour  Intake 100 ml  Output 1800 ml  Net -1700 ml      09/07/2023    1:12 PM 09/07/2023   12:18 AM 09/06/2023   11:03 PM  Last 3 Weights  Weight (lbs) 235 lb 8 oz 256 lb 13.4 oz 240 lb  Weight (kg) 106.822 kg 116.5 kg 108.863 kg      Telemetry     Sinus tachycardia- Personally Reviewed  ECG    No new tracing- Personally Reviewed  Physical Exam   General: Age-appropriate male, hemodynamically stable, no acute distress HEENT: Normocephalic, atraumatic, trachea midline, no JVP Lungs: Clear to auscultation bilaterally Heart: Regular, tachycardic, positive S1-S2, systolic murmur, no gallops or rubs Abdomen: Obese, soft, nontender, nondistended, positive bowel sounds in all 4 quadrants Extremities: Bilateral lower extremity swelling up to the knees.  Left lower extremity is erythematous Neuro: Alert oriented x 4, strength and sensation intact Psych: Normal affect, cooperative  Labs    High Sensitivity Troponin:  No results for input(s): "TROPONINIHS" in the last 720 hours.   Chemistry Recent Labs  Lab 09/06/23 2307 09/08/23 0348 09/09/23 0401  NA 139 136 133*  K 4.2 3.7 4.2  CL 96* 92* 93*  CO2 22 30 26   GLUCOSE 167* 131* 270*  BUN 24* 26* 26*  CREATININE 1.47* 1.43* 1.30*  CALCIUM  10.0 9.3 9.0  MG  --   --  2.1  PROT 7.0  --   --   ALBUMIN 4.4  --   --   AST 23  --   --   ALT 36  --   --  ALKPHOS 86  --   --   BILITOT 0.5  --   --   GFRNONAA 52* 54* >60  ANIONGAP 22* 14 14    Lipids No results for input(s): "CHOL", "TRIG", "HDL", "LABVLDL", "LDLCALC", "CHOLHDL" in the last 168 hours.  Hematology Recent Labs  Lab 09/06/23 2307 09/08/23 0348  WBC 7.2 7.0  RBC 3.90* 3.89*  HGB 12.7* 12.4*  HCT 36.6* 38.3*  MCV 93.8 98.5  MCH 32.6 31.9  MCHC 34.7 32.4  RDW 13.5 13.3  PLT 354 290   Thyroid No results for input(s): "TSH", "FREET4" in the last 168 hours.  BNP Recent Labs  Lab 09/06/23 2307  PROBNP 524.0*    DDimer No results for input(s): "DDIMER" in the last 168 hours.   Radiology    NA  Cardiac Studies   Echocardiogram 08/04/2023: Impressions: 1. Peak LVOT velocity 2.4 m/s, PG 23 mmHg. Left ventricular ejection  fraction, by estimation, is >75%. The left ventricle has hyperdynamic   function. The left ventricle has no regional wall motion abnormalities.  There is moderate left ventricular  hypertrophy of the basal-septal segment. Left ventricular diastolic  parameters are consistent with Grade I diastolic dysfunction (impaired  relaxation).   2. Right ventricular systolic function is normal. The right ventricular  size is normal.   3. The mitral valve is normal in structure. No evidence of mitral valve  regurgitation. No evidence of mitral stenosis.   4. The aortic valve is normal in structure. Aortic valve regurgitation is  not visualized. No aortic stenosis is present.   5. The inferior vena cava is normal in size with greater than 50%  respiratory variability, suggesting right atrial pressure of 3 mmHg.   Conclusion(s)/Recommendation(s): Findings consistent with hypertrophic  cardiomyopathy.   Patient Profile     68 y.o. male with a history of hypertrophic cardiomyopathy, hypertension with chronic diastolic CHF, frequent PVCs s/p ablation in 07/2021, hyperlipidemia, type 2 diabetes mellitus, obstructive sleep apnea on CPAP, prostate cancer, and tobacco abuse who is being seen 09/08/2023 for the evaluation of CHF at the request of Dr. Bobbetta Burnet.   Assessment & Plan    Acute HfpEF exacerbation. Echo in 07/2023 showed LVEF of >75% with moderate LVH of the basal septal segment, grade 1 diastolic dysfunction, and peak LVOT velocity of 2.4 m/s (peak gradient 23 mmHg. Cardiac PET stress test is scheduled for 09/26/2023.  High-sensitivity troponin minimally elevated and flat at 29 >> 31. Pro-BNP elevated at 524.  He presented to the ED for worsening lower extremity edema was noted to be volume overloaded. He reported 11 lbs weight gain since OV in 07/2023.  Will focus on diuresis for now; however, be mindful not to overdiuresis in the setting of HCM Net IO Since Admission: -3,251.57 mL [09/09/23 1044] Continue amlodipine  5 mg p.o. daily. Continue IV Lasix  40 mg IV push twice  daily Continue Toprol -XL 100 mg p.o. twice daily Will uptitrate GDMT once renal function improves and his diuresed well.   Hypertrophic cardiomyopathy. Cardiac MRI in 06/2020 showed asymmetric hypertrophy measuring up to 20mm in the basal anteroseptum (8mm in posterior wall) as well as patchy LGE in basal septum and RV insertion sites consistent with hypertrophic cardiomyopathy.  Continue Toprol -XL. ARB held secondary to AKI Continue diuresis slowly to avoid OVER diuresis as this may worsen obstructive physiology.  Acute kidney injury on chronic kidney disease Baseline creatinine around 1-1.1 Creatinine on admission 1.47 Hold ARB. Avoid nephrotoxic agents.  Moderate coronary artery calcification: Continue aspirin   81 mg p.o. daily. Scheduled for cardiac PET/CT as outpatient.   Sinus tachycardia/frequent PVCs: EKG illustrates sinus tachycardia without any significant ectopy.  Continue beta-blocker therapy  Hypertension: Continue current antihypertensive medications.  Hyperlipidemia: Continue Crestor  20 mg p.o. daily    For questions or updates, please contact [facility] Please consult [email or web address] for contact info under  Please remind the patient to bring the caridac medication bottles at the next office visit to help facilitate a more accurate medication reconciliation.    Signed, Olinda Bertrand, DO, [facility]  [facility]  [address] [place], [place] [address] Pager: [phone] Office: [phone] 09/09/2023, 10:44 AM

## 2023-09-10 DIAGNOSIS — E782 Mixed hyperlipidemia: Secondary | ICD-10-CM

## 2023-09-10 DIAGNOSIS — I422 Other hypertrophic cardiomyopathy: Secondary | ICD-10-CM

## 2023-09-10 DIAGNOSIS — I10 Essential (primary) hypertension: Secondary | ICD-10-CM

## 2023-09-10 DIAGNOSIS — R000 Tachycardia, unspecified: Secondary | ICD-10-CM

## 2023-09-10 DIAGNOSIS — I5031 Acute diastolic (congestive) heart failure: Secondary | ICD-10-CM | POA: Diagnosis not present

## 2023-09-10 DIAGNOSIS — I2584 Coronary atherosclerosis due to calcified coronary lesion: Secondary | ICD-10-CM

## 2023-09-10 DIAGNOSIS — I2510 Atherosclerotic heart disease of native coronary artery without angina pectoris: Secondary | ICD-10-CM

## 2023-09-10 DIAGNOSIS — N179 Acute kidney failure, unspecified: Secondary | ICD-10-CM

## 2023-09-10 LAB — BASIC METABOLIC PANEL WITH GFR
Anion gap: 13 (ref 5–15)
BUN: 33 mg/dL — ABNORMAL HIGH (ref 8–23)
CO2: 27 mmol/L (ref 22–32)
Calcium: 8.6 mg/dL — ABNORMAL LOW (ref 8.9–10.3)
Chloride: 94 mmol/L — ABNORMAL LOW (ref 98–111)
Creatinine, Ser: 1.25 mg/dL — ABNORMAL HIGH (ref 0.61–1.24)
GFR, Estimated: >60 mL/min (ref >60)
Glucose, Bld: 257 mg/dL — ABNORMAL HIGH (ref 70–99)
Potassium: 3.4 mmol/L — ABNORMAL LOW (ref 3.5–5.1)
Sodium: 134 mmol/L — ABNORMAL LOW (ref 135–145)

## 2023-09-10 LAB — GLUCOSE, CAPILLARY
Glucose-Capillary: 208 mg/dL — ABNORMAL HIGH (ref 70–99)
Glucose-Capillary: 250 mg/dL — ABNORMAL HIGH (ref 70–99)
Glucose-Capillary: 212 mg/dL — ABNORMAL HIGH (ref 70–99)
Glucose-Capillary: 254 mg/dL — ABNORMAL HIGH (ref 70–99)

## 2023-09-10 LAB — MAGNESIUM: Magnesium: 2.3 mg/dL (ref 1.7–2.4)

## 2023-09-10 MED ORDER — METOPROLOL SUCCINATE ER 100 MG PO TB24
100.0000 mg | ORAL_TABLET | Freq: Two times a day (BID) | ORAL | Status: AC
  Administered 2023-09-10: 100 mg via ORAL
  Filled 2023-09-10: qty 1

## 2023-09-10 MED ORDER — POTASSIUM CHLORIDE 20 MEQ PO PACK
40.0000 meq | PACK | Freq: Once | ORAL | Status: AC
Start: 2023-09-10 — End: 2023-09-10
  Administered 2023-09-10: 40 meq via ORAL
  Filled 2023-09-10: qty 2

## 2023-09-10 MED ORDER — METOPROLOL SUCCINATE ER 100 MG PO TB24
200.0000 mg | ORAL_TABLET | Freq: Every day | ORAL | Status: DC
  Administered 2023-09-11 – 2023-09-14 (×4): 200 mg via ORAL
  Filled 2023-09-10 (×5): qty 2

## 2023-09-10 MED ORDER — PHENOL 1.4 % MT LIQD
1.0000 | OROMUCOSAL | Status: DC | PRN
  Administered 2023-09-10: 1 via OROMUCOSAL
  Filled 2023-09-10: qty 177

## 2023-09-10 MED ORDER — INSULIN GLARGINE-YFGN 100 UNIT/ML ~~LOC~~ SOLN
10.0000 [IU] | Freq: Every day | SUBCUTANEOUS | Status: DC
  Administered 2023-09-10 – 2023-09-13 (×4): 10 [IU] via SUBCUTANEOUS
  Filled 2023-09-10 (×5): qty 0.1

## 2023-09-10 MED ORDER — FUROSEMIDE 10 MG/ML IJ SOLN
60.0000 mg | Freq: Two times a day (BID) | INTRAMUSCULAR | Status: DC
  Administered 2023-09-10 – 2023-09-11 (×3): 60 mg via INTRAVENOUS
  Filled 2023-09-10 (×3): qty 6

## 2023-09-10 NOTE — Progress Notes (Signed)
 Rounding Note    Patient Name: Joseph Harvey Date of Encounter: 09/10/2023  [facility] Cardiologist: Wendie Hamburg, MD  Chief Complaint: Lower extremity swelling Reason of consult: CHF  Subjective   No anginal chest pain. Significantly volume up.  Inpatient Medications    Scheduled Meds:  amLODipine   5 mg Oral Daily   aspirin  EC  81 mg Oral Daily   budeson-glycopyrrolate -formoterol   2 puff Inhalation QHS   calcitonin (salmon)  1 spray Alternating Nares Daily   cholecalciferol   400 Units Oral Daily   dextromethorphan -guaiFENesin   1 tablet Oral BID   furosemide   40 mg Intravenous BID   heparin   5,000 Units Subcutaneous Q8H   insulin  aspart  0-15 Units Subcutaneous TID WC   insulin  aspart  0-5 Units Subcutaneous QHS   ipratropium-albuterol   3 mL Nebulization BID   linaclotide   145 mcg Oral Daily   loratadine   10 mg Oral Daily   metoprolol  succinate  100 mg Oral BID   nicotine   14 mg Transdermal Daily   pantoprazole   40 mg Oral BID   rosuvastatin   20 mg Oral Daily   Continuous Infusions:   ceFAZolin  (ANCEF ) IV 2 g (09/10/23 0346)   PRN Meds: acetaminophen  **OR** acetaminophen , busPIRone , cyclobenzaprine, famotidine , gabapentin , ipratropium-albuterol , montelukast , ondansetron  **OR** ondansetron  (ZOFRAN ) IV, mouth rinse, phenol, traMADol , traZODone    Vital Signs    Vitals:   09/09/23 2113 09/10/23 0440 09/10/23 0918 09/10/23 0953  BP:  129/70  (!) 141/80  Pulse:  88  100  Resp:  16    Temp:  98.3 F (36.8 C)    TempSrc:  Oral    SpO2: 98% 97% 98%   Weight:      Height:        Intake/Output Summary (Last 24 hours) at 09/10/2023 1156 Last data filed at 09/10/2023 1041 Gross per 24 hour  Intake 340 ml  Output 2350 ml  Net -2010 ml      09/07/2023    1:12 PM 09/07/2023   12:18 AM 09/06/2023   11:03 PM  Last 3 Weights  Weight (lbs) 235 lb 8 oz 256 lb 13.4 oz 240 lb  Weight (kg) 106.822 kg 116.5 kg 108.863 kg      Telemetry    Sinus  tachycardia w/ rare PVCs- Personally Reviewed  ECG    No new tracing- Personally Reviewed  Physical Exam   General: Age-appropriate male, hemodynamically stable, no acute distress HEENT: Normocephalic, atraumatic, trachea midline, no JVP Lungs: Clear to auscultation bilaterally Heart: Regular, tachycardic, positive S1-S2, systolic murmur, no gallops or rubs Abdomen: Obese, soft, nontender, nondistended, positive bowel sounds in all 4 quadrants Extremities: Bilateral lower extremity swelling up to the knees.  Left lower extremity is erythematous (improving)  Neuro: Alert oriented x 4, strength and sensation intact Psych: Normal affect, cooperative  Labs    High Sensitivity Troponin:  No results for input(s): "TROPONINIHS" in the last 720 hours.   Chemistry Recent Labs  Lab 09/06/23 2307 09/08/23 0348 09/09/23 0401 09/10/23 0407  NA 139 136 133* 134*  K 4.2 3.7 4.2 3.4*  CL 96* 92* 93* 94*  CO2 22 30 26 27   GLUCOSE 167* 131* 270* 257*  BUN 24* 26* 26* 33*  CREATININE 1.47* 1.43* 1.30* 1.25*  CALCIUM  10.0 9.3 9.0 8.6*  MG  --   --  2.1 2.3  PROT 7.0  --   --   --   ALBUMIN 4.4  --   --   --  AST 23  --   --   --   ALT 36  --   --   --   ALKPHOS 86  --   --   --   BILITOT 0.5  --   --   --   GFRNONAA 52* 54* >60 >60  ANIONGAP 22* 14 14 13     Lipids No results for input(s): "CHOL", "TRIG", "HDL", "LABVLDL", "LDLCALC", "CHOLHDL" in the last 168 hours.  Hematology Recent Labs  Lab 09/06/23 2307 09/08/23 0348  WBC 7.2 7.0  RBC 3.90* 3.89*  HGB 12.7* 12.4*  HCT 36.6* 38.3*  MCV 93.8 98.5  MCH 32.6 31.9  MCHC 34.7 32.4  RDW 13.5 13.3  PLT 354 290   Thyroid No results for input(s): "TSH", "FREET4" in the last 168 hours.  BNP Recent Labs  Lab 09/06/23 2307  PROBNP 524.0*    DDimer No results for input(s): "DDIMER" in the last 168 hours.   Radiology    NA  Cardiac Studies   Echocardiogram 08/04/2023: Impressions: 1. Peak LVOT velocity 2.4 m/s, PG 23  mmHg. Left ventricular ejection  fraction, by estimation, is >75%. The left ventricle has hyperdynamic  function. The left ventricle has no regional wall motion abnormalities.  There is moderate left ventricular  hypertrophy of the basal-septal segment. Left ventricular diastolic  parameters are consistent with Grade I diastolic dysfunction (impaired  relaxation).   2. Right ventricular systolic function is normal. The right ventricular  size is normal.   3. The mitral valve is normal in structure. No evidence of mitral valve  regurgitation. No evidence of mitral stenosis.   4. The aortic valve is normal in structure. Aortic valve regurgitation is  not visualized. No aortic stenosis is present.   5. The inferior vena cava is normal in size with greater than 50%  respiratory variability, suggesting right atrial pressure of 3 mmHg.   Conclusion(s)/Recommendation(s): Findings consistent with hypertrophic  cardiomyopathy.   Patient Profile     68 y.o. male with a history of hypertrophic cardiomyopathy, hypertension with chronic diastolic CHF, frequent PVCs s/p ablation in 07/2021, hyperlipidemia, type 2 diabetes mellitus, obstructive sleep apnea on CPAP, prostate cancer, and tobacco abuse who is being seen 09/08/2023 for the evaluation of CHF at the request of Dr. Bobbetta Burnet.   Assessment & Plan    Acute HfpEF exacerbation. Echo in 07/2023 showed LVEF of >75% with moderate LVH of the basal septal segment, grade 1 diastolic dysfunction, and peak LVOT velocity of 2.4 m/s (peak gradient 23 mmHg. Cardiac PET stress test is scheduled for 09/26/2023.  High-sensitivity troponin minimally elevated and flat at 29 >> 31. Pro-BNP elevated at 524.  He presented to the ED for worsening lower extremity edema was noted to be volume overloaded. He reported 11 lbs weight gain since OV in 07/2023.  Will focus on diuresis for now; however, be mindful not to overdiuresis in the setting of HCM Net IO Since Admission:  -5,021.57 mL [09/10/23 1156] D/C amlodipine  5 mg p.o. daily due to LE swelling Increase IV Lasix  40 mg IV push twice daily to 60 mg IVP bid Continue Toprol -XL 100 mg p.o. twice daily transition to Toprol  XL 200mg  po qAM starting 09/11/2023 w/ holding parameters  Will uptitrate GDMT once renal function improves and his diuresed well.   Hypertrophic cardiomyopathy. Cardiac MRI in 06/2020 showed asymmetric hypertrophy measuring up to 20mm in the basal anteroseptum (8mm in posterior wall) as well as patchy LGE in basal septum and RV insertion  sites consistent with hypertrophic cardiomyopathy.  Continue Toprol -XL. ARB held secondary to AKI Continue diuresis slowly to avoid OVER diuresis as this may worsen obstructive physiology.  Acute kidney injury on chronic kidney disease Baseline creatinine around 1-1.1 Creatinine on admission 1.47 Cr today 1.25 Hold ARB. Avoid nephrotoxic agents.  Moderate coronary artery calcification: Continue aspirin  81 mg p.o. daily. Scheduled for cardiac PET/CT as outpatient.  Sinus tachycardia/frequent PVCs: EKG illustrates sinus tachycardia without any significant ectopy.  Continue beta-blocker therapy.  Electrolyte replacement per primary team.  Hypertension: Continue current antihypertensive medications.  Hyperlipidemia: Continue Crestor  20 mg p.o. daily    For questions or updates, please contact [facility] Please consult [email or web address] for contact info under  Please remind the patient to bring the caridac medication bottles at the next office visit to help facilitate a more accurate medication reconciliation.    Signed, Olinda Bertrand, DO, [facility]  [facility]  [address] [place], [place] [address] Pager: [phone] Office: [phone] 09/10/2023, 11:56 AM

## 2023-09-10 NOTE — Consult Note (Signed)
 [facility] consulted for lymphadema.   [facility] nursing does not provide this therapy.  See list of outpatient resources for care.  Lymphedema  Resources (updated 03/2021) Each site requires a referral from your primary care MD [facility] [address] [place], [place]  [phone] (Upper extremities)  [address] [place], [place] [phone] (Lower extremities, PATIENT CAN NOT HAVE A WOUND)  [facility] [address] [place], [place] [address] [phone]  [facility] [address], [address] Medical Office [address]  [place], [place] [phone]  [facility] [address] [place], [place] [address] [phone]  [facility]  (only treatment for lymphedema related to cancer diagnosis) [address]  [place], [place] [address] [phone]    [facility] [address] [place], [place] [address] [phone] [facility] (formerly [facility]) [phone] [address] [place], [place] [address] [phone]  Notified hospitalist  Re consult if needed, will not follow at this time. Thanks  Rendy [employer]azard [employer], RN,CWOCN, CNS, CWON-AP [phone])

## 2023-09-10 NOTE — Progress Notes (Signed)
 [facility]  PROGRESS NOTE  Joseph Harvey EAV:[id number] DOB: 08/08/1955 DOA: 09/06/2023 PCP: Vevelyn Gowers, NP   Brief HPI:   68 y.o. male with medical history significant for chronic heart failure with preserved EF, hypertrophic cardiomyopathy, hypertension, non-insulin -dependent type 2 diabetes, hyperlipidemia being admitted to the hospital with several days of lower extremity edema and new onset erythema with concern for acute on chronic diastolic congestive heart failure and left lower extremity cellulitis. His amlodipine  dose was recently reduced to try and control his lower extremity edema, he was also started on as needed and then daily oral Lasix  which she has been taking AND probably gained about 11 pounds over the last few days.  In the ED vitals showed tachycardia, hypertensive, labs with elevated creatinine 1.4 mild chronic anemia Cxr>Streaky lingular opacity, atelectasis versus pneumonia.  Duplex of the leg no evidence of DVT LLE xray left ankle moderate to severe diffuse soft tissue swelling Patient admitted for acute on chronic CHF AKI    Assessment/Plan:   Acute on chronic HFpEF Hypertrophic cardiomyopathy -had cMRI in 06/2020 on metoprolol : -Cardiology consulted, currently on IV Lasix  60 mg twice daily -Continue Toprol  XL 100 mg p.o. twice daily -Net -5 L since admission    Left lower extremity cellulitis: -Patient has bilateral lymphedema - Venous duplex was negative for DVT -X-ray of left leg ruled out osteomyelitis. - MRSA PCR negative - Started on cefazolin   AKI: -Creatinine improving with diuresis - Today creatinine 1.25 - Follow BMP in am  Diabetes mellitus type 2 - Last A1c was 7.3 in February 2025 - CBG has been elevated - Continue moderate sliding scale insulin  with NovoLog  - Will add Lantus 10 units subcu daily  Frequent PVC Elevated troponin: trops flat, without cardiac symptoms.  Likely demand ischemia in the setting of CHF and AKI.   Monitor lites due to NSVT/SVT continue Toprol  monitoring daily   GERD: Cont protonix    HyperlipidemiaL: Cont Crestor    Chronic back pain/slipped disc: Cont pain controlled.  Continue tramadol  Flexeril Requesting nasal calcitonin   Class I Obesity w/ Body mass index is 34.78 kg/m.: Will benefit with PCP follow-up    Medications     amLODipine   5 mg Oral Daily   aspirin  EC  81 mg Oral Daily   budeson-glycopyrrolate -formoterol   2 puff Inhalation QHS   calcitonin (salmon)  1 spray Alternating Nares Daily   cholecalciferol   400 Units Oral Daily   dextromethorphan -guaiFENesin   1 tablet Oral BID   furosemide   40 mg Intravenous BID   heparin   5,000 Units Subcutaneous Q8H   insulin  aspart  0-15 Units Subcutaneous TID WC   insulin  aspart  0-5 Units Subcutaneous QHS   ipratropium-albuterol   3 mL Nebulization BID   linaclotide   145 mcg Oral Daily   loratadine   10 mg Oral Daily   metoprolol  succinate  100 mg Oral BID   nicotine   14 mg Transdermal Daily   pantoprazole   40 mg Oral BID   potassium chloride  SA  20 mEq Oral Daily   rosuvastatin   20 mg Oral Daily     Data Reviewed:   CBG:  Recent Labs  Lab 09/08/23 2100 09/09/23 0751 09/09/23 1117 09/09/23 1702 09/09/23 2054  GLUCAP 193* 218* 300* 245* 184*    SpO2: 97 % O2 Flow Rate (L/min): 3 L/min    Vitals:   09/09/23 1200 09/09/23 2015 09/09/23 2113 09/10/23 0440  BP: (!) 140/89 133/77  129/70  Pulse: (!) 109 99  88  Resp: 18 20  16  Temp: 98.9 F (37.2 C) 97.9 F (36.6 C)  98.3 F (36.8 C)  TempSrc: Oral Oral  Oral  SpO2: 97% 98% 98% 97%  Weight:      Height:          Data Reviewed:  Basic Metabolic Panel: Recent Labs  Lab 09/06/23 2307 09/08/23 0348 09/09/23 0401 09/10/23 0407  NA 139 136 133* 134*  K 4.2 3.7 4.2 3.4*  CL 96* 92* 93* 94*  CO2 22 30 26 27   GLUCOSE 167* 131* 270* 257*  BUN 24* 26* 26* 33*  CREATININE 1.47* 1.43* 1.30* 1.25*  CALCIUM  10.0 9.3 9.0 8.6*  MG  --   --  2.1 2.3     CBC: Recent Labs  Lab 09/06/23 2307 09/08/23 0348  WBC 7.2 7.0  NEUTROABS 5.0  --   HGB 12.7* 12.4*  HCT 36.6* 38.3*  MCV 93.8 98.5  PLT 354 290    LFT Recent Labs  Lab 09/06/23 2307  AST 23  ALT 36  ALKPHOS 86  BILITOT 0.5  PROT 7.0  ALBUMIN 4.4     Antibiotics: Anti-infectives (From admission, onward)    Start     Dose/Rate Route Frequency Ordered Stop   09/07/23 1100  ceFAZolin  (ANCEF ) IVPB 2g/100 mL premix        2 g 200 mL/hr over 30 Minutes Intravenous Every 8 hours 09/07/23 0852     09/07/23 0245  ceFAZolin  (ANCEF ) IVPB 2g/100 mL premix        2 g 200 mL/hr over 30 Minutes Intravenous  Once 09/07/23 0232 09/07/23 0524        DVT prophylaxis: Heparin   Code Status: Full code  Family Communication: No family at bedside   CONSULTS    Subjective   Patient says that the erythema and swelling continues to improve.   Objective    Physical Examination:  General-appears in no acute distress Heart-S1-S2, regular, no murmur auscultated Lungs-clear to auscultation bilaterally, no wheezing or crackles auscultated Abdomen-soft, nontender, no organomegaly Extremities-2+ edema in the lower extremities bilaterally.  Left more than right .  Erythema noted in left lower extremity, large blister noted at the anterior aspect of left lower extremity Neuro-alert, oriented x3, no focal deficit noted  Status is: Inpatient:             Ozell Blunt   [facility] If 7PM-7AM, please contact night-coverage at [email or web address], Office  [phone]   09/10/2023, 7:51 AM  LOS: 3 days

## 2023-09-10 NOTE — Plan of Care (Signed)

## 2023-09-11 DIAGNOSIS — N179 Acute kidney failure, unspecified: Secondary | ICD-10-CM | POA: Diagnosis not present

## 2023-09-11 DIAGNOSIS — L03116 Cellulitis of left lower limb: Secondary | ICD-10-CM | POA: Diagnosis not present

## 2023-09-11 DIAGNOSIS — I5033 Acute on chronic diastolic (congestive) heart failure: Secondary | ICD-10-CM | POA: Diagnosis not present

## 2023-09-11 DIAGNOSIS — I10 Essential (primary) hypertension: Secondary | ICD-10-CM | POA: Diagnosis not present

## 2023-09-11 LAB — CBC
HCT: 34.8 % — ABNORMAL LOW (ref 39.0–52.0)
Hemoglobin: 11.3 g/dL — ABNORMAL LOW (ref 13.0–17.0)
MCH: 31.7 pg (ref 26.0–34.0)
MCHC: 32.5 g/dL (ref 30.0–36.0)
MCV: 97.5 fL (ref 80.0–100.0)
Platelets: 300 10*3/uL (ref 150–400)
RBC: 3.57 MIL/uL — ABNORMAL LOW (ref 4.22–5.81)
RDW: 13.2 % (ref 11.5–15.5)
WBC: 5.4 10*3/uL (ref 4.0–10.5)
nRBC: 0 % (ref 0.0–0.2)

## 2023-09-11 LAB — BASIC METABOLIC PANEL WITH GFR
Anion gap: 14 (ref 5–15)
BUN: 27 mg/dL — ABNORMAL HIGH (ref 8–23)
CO2: 25 mmol/L (ref 22–32)
Calcium: 8.7 mg/dL — ABNORMAL LOW (ref 8.9–10.3)
Chloride: 97 mmol/L — ABNORMAL LOW (ref 98–111)
Creatinine, Ser: 1.16 mg/dL (ref 0.61–1.24)
GFR, Estimated: >60 mL/min (ref >60)
Glucose, Bld: 231 mg/dL — ABNORMAL HIGH (ref 70–99)
Potassium: 3.8 mmol/L (ref 3.5–5.1)
Sodium: 136 mmol/L (ref 135–145)

## 2023-09-11 LAB — MAGNESIUM: Magnesium: 2.3 mg/dL (ref 1.7–2.4)

## 2023-09-11 LAB — GLUCOSE, CAPILLARY
Glucose-Capillary: 207 mg/dL — ABNORMAL HIGH (ref 70–99)
Glucose-Capillary: 245 mg/dL — ABNORMAL HIGH (ref 70–99)
Glucose-Capillary: 226 mg/dL — ABNORMAL HIGH (ref 70–99)
Glucose-Capillary: 173 mg/dL — ABNORMAL HIGH (ref 70–99)

## 2023-09-11 MED ORDER — IRBESARTAN 300 MG PO TABS
300.0000 mg | ORAL_TABLET | Freq: Every day | ORAL | Status: DC
  Administered 2023-09-11 – 2023-09-14 (×4): 300 mg via ORAL
  Filled 2023-09-11 (×5): qty 1

## 2023-09-11 NOTE — Plan of Care (Signed)

## 2023-09-11 NOTE — Progress Notes (Signed)
 Patient Name: Joseph Harvey Date of Encounter: 09/11/2023 [facility] Cardiologist: Wendie Hamburg, MD   Interval Summary  .     68 y.o. male with a history of hypertrophic cardiomyopathy, hypertension with chronic diastolic CHF, frequent PVCs s/p ablation in 07/2021, hyperlipidemia, type 2 diabetes mellitus, obstructive sleep apnea on CPAP, prostate cancer, and tobacco abuse, cardiology is following for CHF.   Patient states he has not been walking much, ambulated in the room occasionally, SOB at times but not too bad, felt his legs are still very swollen, having a lot of pain of LLE, felt the erythema is improving. He reports urinating a lot. He was on Lasix  40mg  BID PTA. He states he had at elast 3 months legs swelling before coming in. He denied chest pain, syncope.   Vital Signs .    Vitals:   09/10/23 1541 09/10/23 2029 09/10/23 2102 09/11/23 0401  BP: 134/83 (!) 142/82  (!) 143/79  Pulse: 100 (!) 105  88  Resp: 16 19  19   Temp: 97.9 F (36.6 C) 98.3 F (36.8 C)  97.9 F (36.6 C)  TempSrc: Oral Oral    SpO2: 97% 96% 97% 97%  Weight:      Height:        Intake/Output Summary (Last 24 hours) at 09/11/2023 0733 Last data filed at 09/10/2023 1845 Gross per 24 hour  Intake 920 ml  Output 2025 ml  Net -1105 ml      09/07/2023    1:12 PM 09/07/2023   12:18 AM 09/06/2023   11:03 PM  Last 3 Weights  Weight (lbs) 235 lb 8 oz 256 lb 13.4 oz 240 lb  Weight (kg) 106.822 kg 116.5 kg 108.863 kg      Telemetry/ECG    Sinus rhythm, PVCs  - Personally Reviewed  Physical Exam .   GEN: No acute distress.   Neck: No JVD Cardiac: RRR, no murmur  Respiratory: Scattered wheezing noted to auscultation bilaterally. On room air.  GI: Soft, nontender, non-distended  MS: 1-2+ pitting edema BLE, LLE with blister and erythema and tenderness  Assessment & Plan .     Acute on chronic diastolic CHF Hypertrophic Cardiomyopathy   Moderate coronary artery calcification   Sinus tachycardia with frequent PVCs s/p PVC ablation 2023 - Cardiac MRI 06/2020 showed asymmetric hypertrophy measuring up to 20mm in the basal anteroseptum (8mm in posterior wall) as well as patchy LGE in basal septum and RV insertion sites consistent with hypertrophic cardiomyopathy.  - Last Echo in 07/2023 showed LVEF of >75% with moderate LVH of the basal septal segment, grade 1 DD, and peak LVOT velocity of 2.4 m/s (peak gradient 23 mmHg.  - Cardiac PET stress test is scheduled for 09/26/2023  - presented with worsening lower extremity edema, 11 lbs weight gain since OV in 07/2023 - Pro-BNP 524.  - Chest x-ray showed streaky lingular opacity felt to be atelectasis vs pneumonia.   - caution for over-diuresis given hypertrophic cardiomyopathy - IV Lasix  increased to 60mg  BID yesterday, Net-5.1L, weight 256.84 >>235.ib, I&O and daily weight not accurately tracked, clinically remains hypervolemic, renal index today suggest resolved AKI, leg edema will likely resolve over time, would continue IV diuresis for another 24 -48 hours before transition to PO, please OOB and ambulate  - GDMT: transitioned to Toprol -XL 200mg  daily today, can resume PTA valsartan  320mg  today given resolved AKI, may trial SGLT2I before discharge if renal stable   AKI on CKD LLE cellulitis  HTN  HLD Type 2 DM Tobacco use  - per pirmary team     For questions or updates, please contact [facility] Please consult [email or web address] for contact info under        Signed, Trequan Marsolek, NP

## 2023-09-11 NOTE — Progress Notes (Addendum)
 [facility]  PROGRESS NOTE  Joseph Harvey GUY:[id number] DOB: 04/04/1956 DOA: 09/06/2023 PCP: Vevelyn Gowers, NP   Brief HPI:   68 y.o. male with medical history significant for chronic heart failure with preserved EF, hypertrophic cardiomyopathy, hypertension, non-insulin -dependent type 2 diabetes, hyperlipidemia being admitted to the hospital with several days of lower extremity edema and new onset erythema with concern for acute on chronic diastolic congestive heart failure and left lower extremity cellulitis. His amlodipine  dose was recently reduced to try and control his lower extremity edema, he was also started on as needed and then daily oral Lasix  which she has been taking AND probably gained about 11 pounds over the last few days.  In the ED vitals showed tachycardia, hypertensive, labs with elevated creatinine 1.4 mild chronic anemia Cxr>Streaky lingular opacity, atelectasis versus pneumonia.  Duplex of the leg no evidence of DVT LLE xray left ankle moderate to severe diffuse soft tissue swelling Patient admitted for acute on chronic CHF AKI    Assessment/Plan:   Acute on chronic HFpEF Hypertrophic cardiomyopathy -had cMRI in 06/2020 on metoprolol : -Cardiology consulted, currently on IV Lasix  60 mg twice daily -Continue Toprol  XL 100 mg p.o. twice daily -Net -5 L since admission    Left lower extremity cellulitis: -Patient has bilateral lymphedema; cellulitis of left lower extremity is improving - Venous duplex was negative for DVT -X-ray of left leg ruled out osteomyelitis. - MRSA PCR negative - Started on cefazolin   Bilateral lymphedema - Will order TED hose for right lower extremity - Will need referral to lymphedema clinic at discharge  AKI: -Creatinine improving with diuresis - Today creatinine 1.16 - Follow BMP in am  Diabetes mellitus type 2 - Last A1c was 7.3 in February 2025 - CBG has been elevated - Continue moderate sliding scale insulin  with  NovoLog  - Continue Lantus 10 units subcu daily  Frequent PVC Elevated troponin: trops flat, without cardiac symptoms.  Likely demand ischemia in the setting of CHF and AKI.  Monitor lites due to NSVT/SVT continue Toprol  monitoring daily   GERD: Cont protonix    HyperlipidemiaL: Cont Crestor    Chronic back pain/slipped disc: Cont pain controlled.  Continue tramadol  Flexeril Requesting nasal calcitonin   Class I Obesity w/ Body mass index is 34.78 kg/m.: Will benefit with PCP follow-up    Medications     aspirin  EC  81 mg Oral Daily   budeson-glycopyrrolate -formoterol   2 puff Inhalation QHS   calcitonin (salmon)  1 spray Alternating Nares Daily   cholecalciferol   400 Units Oral Daily   dextromethorphan -guaiFENesin   1 tablet Oral BID   furosemide   60 mg Intravenous BID   heparin   5,000 Units Subcutaneous Q8H   insulin  aspart  0-15 Units Subcutaneous TID WC   insulin  aspart  0-5 Units Subcutaneous QHS   insulin  glargine-yfgn  10 Units Subcutaneous QHS   ipratropium-albuterol   3 mL Nebulization BID   linaclotide   145 mcg Oral Daily   loratadine   10 mg Oral Daily   metoprolol  succinate  200 mg Oral Daily   nicotine   14 mg Transdermal Daily   pantoprazole   40 mg Oral BID   rosuvastatin   20 mg Oral Daily     Data Reviewed:   CBG:  Recent Labs  Lab 09/10/23 0759 09/10/23 1210 09/10/23 1712 09/10/23 2026 09/11/23 0745  GLUCAP 208* 250* 212* 254* 207*    SpO2: 97 % O2 Flow Rate (L/min): 3 L/min    Vitals:   09/10/23 1541 09/10/23 2029 09/10/23 2102 09/11/23 0401  BP: 134/83 (!) 142/82  (!) 143/79  Pulse: 100 (!) 105  88  Resp: 16 19  19   Temp: 97.9 F (36.6 C) 98.3 F (36.8 C)  97.9 F (36.6 C)  TempSrc: Oral Oral    SpO2: 97% 96% 97% 97%  Weight:      Height:          Data Reviewed:  Basic Metabolic Panel: Recent Labs  Lab 09/06/23 2307 09/08/23 0348 09/09/23 0401 09/10/23 0407 09/11/23 0330  NA 139 136 133* 134* 136  K 4.2 3.7 4.2 3.4*  3.8  CL 96* 92* 93* 94* 97*  CO2 22 30 26 27 25   GLUCOSE 167* 131* 270* 257* 231*  BUN 24* 26* 26* 33* 27*  CREATININE 1.47* 1.43* 1.30* 1.25* 1.16  CALCIUM  10.0 9.3 9.0 8.6* 8.7*  MG  --   --  2.1 2.3 2.3    CBC: Recent Labs  Lab 09/06/23 2307 09/08/23 0348 09/11/23 0330  WBC 7.2 7.0 5.4  NEUTROABS 5.0  --   --   HGB 12.7* 12.4* 11.3*  HCT 36.6* 38.3* 34.8*  MCV 93.8 98.5 97.5  PLT 354 290 300    LFT Recent Labs  Lab 09/06/23 2307  AST 23  ALT 36  ALKPHOS 86  BILITOT 0.5  PROT 7.0  ALBUMIN 4.4     Antibiotics: Anti-infectives (From admission, onward)    Start     Dose/Rate Route Frequency Ordered Stop   09/07/23 1100  ceFAZolin  (ANCEF ) IVPB 2g/100 mL premix        2 g 200 mL/hr over 30 Minutes Intravenous Every 8 hours 09/07/23 0852     09/07/23 0245  ceFAZolin  (ANCEF ) IVPB 2g/100 mL premix        2 g 200 mL/hr over 30 Minutes Intravenous  Once 09/07/23 0232 09/07/23 0524        DVT prophylaxis: Heparin   Code Status: Full code  Family Communication: No family at bedside   CONSULTS    Subjective   Feels better this morning.  Left lower extremity cellulitis is improving.   Objective    Physical Examination:  General-appears in no acute distress Heart-S1-S2, regular, no murmur auscultated Lungs-clear to auscultation bilaterally, no wheezing or crackles auscultated Abdomen-soft, nontender, no organomegaly Extremities-dusky erythema left lower extremity, bilateral 2+ pitting edema in the lower extremities; left more than right Neuro-alert, oriented x3, no focal deficit noted Status is: Inpatient:             Joseph Harvey   [facility] If 7PM-7AM, please contact night-coverage at [email or web address], Office  [phone]   09/11/2023, 8:12 AM  LOS: 4 days

## 2023-09-11 NOTE — TOC CM/SW Note (Signed)
 Transition of Care [facility]) - Inpatient Brief Assessment   Patient Details  Name: Alrick Waibel MRN: [id number] Date of Birth: [birth year]  Transition of Care [facility]) CM/SW Contact:    Gertha Ku, LCSW Phone Number: 09/11/2023, 3:33 PM    Transition of Care Asessment: Insurance and Status: Insurance coverage has been reviewed Patient has primary care physician: Yes Home environment has been reviewed: home with self Prior level of function:: independent Prior/Current Home Services: No current home services Social Drivers of Health Review: SDOH reviewed no interventions necessary Readmission risk has been reviewed: Yes Transition of care needs: no transition of care needs at this time

## 2023-09-11 NOTE — Consult Note (Signed)
 WOC consulted for ruptured blister LE; added orders per the nursing skin care order set for xeroform and foam dressing.    Re consult if needed, will not follow at this time. Thanks  Noami Bove [employer], RN,CWOCN, CNS, CWON-AP [phone])

## 2023-09-12 ENCOUNTER — Ambulatory Visit: Admitting: Cardiology

## 2023-09-12 DIAGNOSIS — N179 Acute kidney failure, unspecified: Secondary | ICD-10-CM | POA: Diagnosis not present

## 2023-09-12 DIAGNOSIS — I509 Heart failure, unspecified: Secondary | ICD-10-CM | POA: Diagnosis not present

## 2023-09-12 DIAGNOSIS — I5033 Acute on chronic diastolic (congestive) heart failure: Secondary | ICD-10-CM | POA: Diagnosis not present

## 2023-09-12 DIAGNOSIS — L03116 Cellulitis of left lower limb: Secondary | ICD-10-CM | POA: Diagnosis not present

## 2023-09-12 LAB — BASIC METABOLIC PANEL WITH GFR
Anion gap: 13 (ref 5–15)
BUN: 37 mg/dL — ABNORMAL HIGH (ref 8–23)
CO2: 25 mmol/L (ref 22–32)
Calcium: 8.7 mg/dL — ABNORMAL LOW (ref 8.9–10.3)
Chloride: 97 mmol/L — ABNORMAL LOW (ref 98–111)
Creatinine, Ser: 1.36 mg/dL — ABNORMAL HIGH (ref 0.61–1.24)
GFR, Estimated: 57 mL/min — ABNORMAL LOW (ref >60)
Glucose, Bld: 239 mg/dL — ABNORMAL HIGH (ref 70–99)
Potassium: 3.3 mmol/L — ABNORMAL LOW (ref 3.5–5.1)
Sodium: 135 mmol/L (ref 135–145)

## 2023-09-12 LAB — GLUCOSE, CAPILLARY
Glucose-Capillary: 204 mg/dL — ABNORMAL HIGH (ref 70–99)
Glucose-Capillary: 258 mg/dL — ABNORMAL HIGH (ref 70–99)
Glucose-Capillary: 228 mg/dL — ABNORMAL HIGH (ref 70–99)
Glucose-Capillary: 149 mg/dL — ABNORMAL HIGH (ref 70–99)

## 2023-09-12 MED ORDER — POTASSIUM CHLORIDE CRYS ER 20 MEQ PO TBCR
40.0000 meq | EXTENDED_RELEASE_TABLET | Freq: Once | ORAL | Status: AC
  Administered 2023-09-12: 40 meq via ORAL
  Filled 2023-09-12: qty 2

## 2023-09-12 MED ORDER — FUROSEMIDE 10 MG/ML IJ SOLN
80.0000 mg | Freq: Two times a day (BID) | INTRAMUSCULAR | Status: DC
  Administered 2023-09-12 – 2023-09-13 (×2): 80 mg via INTRAVENOUS
  Filled 2023-09-12 (×2): qty 8

## 2023-09-12 NOTE — Progress Notes (Addendum)
 [facility]  PROGRESS NOTE  Neema Nair ZOX:[id number] DOB: [birth year] DOA: 09/06/2023 PCP: Vevelyn Gowers, NP   Brief HPI:   68 y.o. male with medical history significant for chronic heart failure with preserved EF, hypertrophic cardiomyopathy, hypertension, non-insulin -dependent type 2 diabetes, hyperlipidemia being admitted to the hospital with several days of lower extremity edema and new onset erythema with concern for acute on chronic diastolic congestive heart failure and left lower extremity cellulitis. His amlodipine  dose was recently reduced to try and control his lower extremity edema, he was also started on as needed and then daily oral Lasix  which she has been taking AND probably gained about 11 pounds over the last few days.  In the ED vitals showed tachycardia, hypertensive, labs with elevated creatinine 1.4 mild chronic anemia Cxr>Streaky lingular opacity, atelectasis versus pneumonia.  Duplex of the leg no evidence of DVT LLE xray left ankle moderate to severe diffuse soft tissue swelling Patient admitted for acute on chronic CHF AKI    Assessment/Plan:   Acute on chronic HFpEF Hypertrophic cardiomyopathy -had cMRI in 06/2020 on metoprolol  -Presented with bilateral lower extremity edema, dyspnea on exertion -BNP 524 -Cardiology consulted, currently on IV Lasix  60 mg twice daily -Creatinine elevated today at 1.36, Lasix  60 mg twice daily will be held -Will defer to cardiology regarding restarting Lasix . -Continue Toprol  XL 100 mg p.o. twice daily -Net -4.5 L since admission    Left lower extremity cellulitis: -Significantly improved -Patient has bilateral lymphedema; cellulitis of left lower extremity is improving - Venous duplex was negative for DVT -X-ray of left leg ruled out osteomyelitis. - MRSA PCR negative - Started on cefazolin , day #5 - Will complete 7 days of treatment  Bilateral lymphedema - Will order TED hose for right lower extremity - Will  need referral to lymphedema clinic at discharge - Resources for outpatient lymphedema clinic given to patient  AKI: -Creatinine is worse today with diuresis - Today creatinine 1.36 - Follow BMP in am  Hypokalemia - Potassium is 3.3 - Replace potassium and follow BMP in am  Diabetes mellitus type 2 - Last A1c was 7.3 in February 2025 - CBG well-controlled - Continue moderate sliding scale insulin  with NovoLog  - Continue Lantus 10 units subcu daily  Frequent PVC Elevated troponin: trops flat, without cardiac symptoms.  Likely demand ischemia in the setting of CHF and AKI.  Monitor lites due to NSVT/SVT continue Toprol  monitoring daily   GERD: Cont protonix    HyperlipidemiaL: Cont Crestor    Chronic back pain/slipped disc: Cont pain controlled.  Continue tramadol  Flexeril Requesting nasal calcitonin   Class I Obesity w/ Body mass index is 34.78 kg/m.: Will benefit with PCP follow-up    Medications     aspirin  EC  81 mg Oral Daily   budeson-glycopyrrolate -formoterol   2 puff Inhalation QHS   calcitonin (salmon)  1 spray Alternating Nares Daily   cholecalciferol   400 Units Oral Daily   dextromethorphan -guaiFENesin   1 tablet Oral BID   heparin   5,000 Units Subcutaneous Q8H   insulin  aspart  0-15 Units Subcutaneous TID WC   insulin  aspart  0-5 Units Subcutaneous QHS   insulin  glargine-yfgn  10 Units Subcutaneous QHS   ipratropium-albuterol   3 mL Nebulization BID   irbesartan   300 mg Oral Daily   linaclotide   145 mcg Oral Daily   loratadine   10 mg Oral Daily   metoprolol  succinate  200 mg Oral Daily   nicotine   14 mg Transdermal Daily   pantoprazole   40 mg Oral BID  rosuvastatin   20 mg Oral Daily     Data Reviewed:   CBG:  Recent Labs  Lab 09/11/23 0745 09/11/23 1144 09/11/23 1748 09/11/23 2052 09/12/23 0736  GLUCAP 207* 245* 226* 173* 204*    SpO2: 98 % O2 Flow Rate (L/min): 3 L/min    Vitals:   09/11/23 2038 09/11/23 2040 09/11/23 2049 09/12/23  0540  BP:   138/81 (!) 150/85  Pulse:   96 86  Resp:   19 20  Temp:   97.7 F (36.5 C) 97.7 F (36.5 C)  TempSrc:    Oral  SpO2: 96% 96% 94% 98%  Weight:      Height:          Data Reviewed:  Basic Metabolic Panel: Recent Labs  Lab 09/08/23 0348 09/09/23 0401 09/10/23 0407 09/11/23 0330 09/12/23 0352  NA 136 133* 134* 136 135  K 3.7 4.2 3.4* 3.8 3.3*  CL 92* 93* 94* 97* 97*  CO2 30 26 27 25 25   GLUCOSE 131* 270* 257* 231* 239*  BUN 26* 26* 33* 27* 37*  CREATININE 1.43* 1.30* 1.25* 1.16 1.36*  CALCIUM  9.3 9.0 8.6* 8.7* 8.7*  MG  --  2.1 2.3 2.3  --     CBC: Recent Labs  Lab 09/06/23 2307 09/08/23 0348 09/11/23 0330  WBC 7.2 7.0 5.4  NEUTROABS 5.0  --   --   HGB 12.7* 12.4* 11.3*  HCT 36.6* 38.3* 34.8*  MCV 93.8 98.5 97.5  PLT 354 290 300    LFT Recent Labs  Lab 09/06/23 2307  AST 23  ALT 36  ALKPHOS 86  BILITOT 0.5  PROT 7.0  ALBUMIN 4.4     Antibiotics: Anti-infectives (From admission, onward)    Start     Dose/Rate Route Frequency Ordered Stop   09/07/23 1100  ceFAZolin  (ANCEF ) IVPB 2g/100 mL premix        2 g 200 mL/hr over 30 Minutes Intravenous Every 8 hours 09/07/23 0852     09/07/23 0245  ceFAZolin  (ANCEF ) IVPB 2g/100 mL premix        2 g 200 mL/hr over 30 Minutes Intravenous  Once 09/07/23 0232 09/07/23 0524        DVT prophylaxis: Heparin   Code Status: Full code  Family Communication: No family at bedside   CONSULTS    Subjective   Denies shortness of breath.  Continues to have leg swelling   Objective    Physical Examination:  Appears in no acute distress S1-S2, regular, no murmur auscultated Abdomen is soft, nontender, no organomegaly Extremities bilateral 2+ edema, right more than left, very mild erythema. Status is: Inpatient:             Ozell Blunt   [facility] If 7PM-7AM, please contact night-coverage at [email or web address], Office  [phone]   09/12/2023, 8:08 AM  LOS: 5 days

## 2023-09-12 NOTE — Progress Notes (Addendum)
 Patient Name: Joseph Harvey Date of Encounter: 09/12/2023 [facility] Cardiologist: Wendie Hamburg, MD   Interval Summary  .   Patient has a history of HOCM hypertension, chronic diastolic CHF, PVC ablation, hyperlipidemia, diabetes, OSA on CPAP. Admitted with several month history of shortness of breath, lower extremity edema and CHF.   Currently on IV diuretics and put out 800 cc yesterday and is net -4.6 L since admission.  Weight down 5 pounds from admission.  Frustrated with how long it is taking to remove fluid   Vital Signs .    Vitals:   09/11/23 2049 09/12/23 0540 09/12/23 0808 09/12/23 1205  BP: 138/81 (!) 150/85  (!) 129/54  Pulse: 96 86  91  Resp: 19 20  20   Temp: 97.7 F (36.5 C) 97.7 F (36.5 C)  98.5 F (36.9 C)  TempSrc:  Oral    SpO2: 94% 98% 96% 99%  Weight:      Height:        Intake/Output Summary (Last 24 hours) at 09/12/2023 1248 Last data filed at 09/12/2023 0845 Gross per 24 hour  Intake 1142.82 ml  Output 800 ml  Net 342.82 ml      09/07/2023    1:12 PM 09/07/2023   12:18 AM 09/06/2023   11:03 PM  Last 3 Weights  Weight (lbs) 235 lb 8 oz 256 lb 13.4 oz 240 lb  Weight (kg) 106.822 kg 116.5 kg 108.863 kg      Telemetry/ECG    Normal sinus rhythm- Personally Reviewed  Physical Exam .   GEN: Well nourished, well developed in no acute distress HEENT: Normal NECK: No JVD; No carotid bruits LYMPHATICS: No lymphadenopathy CARDIAC:RRR, no murmurs, rubs, gallops RESPIRATORY:  Clear to auscultation without rales, wheezing or rhonchi  ABDOMEN: Soft, non-tender, non-distended MUSCULOSKELETAL: 2-3+ bilateral lower extremity pedal edema; erythema of the left lower extremity SKIN: Warm and dry NEUROLOGIC:  Alert and oriented x 3 PSYCHIATRIC:  Normal affect  Assessment & Plan .     Coronary artery calcifications Hyperlipidemia Shortness of breath Hypertension Coronary calcium  score 2021 was 117 Denies chest pain but shortness  of breath could be an anginal equivalent He is scheduled for stress PET/CT 09/26/2023 Continue aspirin  81 mg daily, Toprol  XL 200 mg daily, Crestor  20 mg daily Check FLP in a.m.   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), patchy LGE in basal septum and RV insertion sites consistent with HOCM 2D echo 08/03/2023 with EF greater than 75%, moderate LVH, G1DD and peak LVOT velocity 2.4 mm/s Presented with a 29-month history of right sided heart failure symptoms including lower extremity edema, 11 pound weight gain and shortness of breath BNP 524 and chest x-ray with streaky lingular opacity consistent with atelectasis versus pneumonia Currently on a Lasix  60 mg IV twice daily UOP net -4.6 L and put out 800 cc yesterday Weight 256>> 235 on 09/07/2023 but no weight since then I have written an order for daily weights Serum creatinine bumped from 1.16->>1.36 today Despite bump in serum creatinine he is still markedly volume overloaded  Given that his diuresis fell off yesterday am going to increase his Lasix  to 80 mg IV twice daily  If serum creatinine continues to increase will need right heart catheterization  GDMT: Continue to 200 mg daily  Continue Avapro  300 mg daily Consider addition of SGLT2i as outpatient if renal function remains stable   LLE cellulitis Per TRH  I spent 35 minutes caring for this patient today face to face, ordering and reviewing labs, reviewing records from 2D echo, cMRI , seeing the patient, documenting in the record  For questions or updates, please contact [facility] Please consult [email or web address] for contact info under        Signed, Gaylyn Keas, MD

## 2023-09-13 DIAGNOSIS — E7800 Pure hypercholesterolemia, unspecified: Secondary | ICD-10-CM

## 2023-09-13 DIAGNOSIS — R0602 Shortness of breath: Secondary | ICD-10-CM

## 2023-09-13 DIAGNOSIS — I5033 Acute on chronic diastolic (congestive) heart failure: Secondary | ICD-10-CM

## 2023-09-13 LAB — BASIC METABOLIC PANEL WITH GFR
Anion gap: 14 (ref 5–15)
BUN: 34 mg/dL — ABNORMAL HIGH (ref 8–23)
CO2: 24 mmol/L (ref 22–32)
Calcium: 9.1 mg/dL (ref 8.9–10.3)
Chloride: 95 mmol/L — ABNORMAL LOW (ref 98–111)
Creatinine, Ser: 1.39 mg/dL — ABNORMAL HIGH (ref 0.61–1.24)
GFR, Estimated: 56 mL/min — ABNORMAL LOW (ref >60)
Glucose, Bld: 187 mg/dL — ABNORMAL HIGH (ref 70–99)
Potassium: 3.8 mmol/L (ref 3.5–5.1)
Sodium: 133 mmol/L — ABNORMAL LOW (ref 135–145)

## 2023-09-13 LAB — GLUCOSE, CAPILLARY
Glucose-Capillary: 195 mg/dL — ABNORMAL HIGH (ref 70–99)
Glucose-Capillary: 143 mg/dL — ABNORMAL HIGH (ref 70–99)
Glucose-Capillary: 199 mg/dL — ABNORMAL HIGH (ref 70–99)

## 2023-09-13 MED ORDER — FUROSEMIDE 10 MG/ML IJ SOLN
80.0000 mg | Freq: Two times a day (BID) | INTRAMUSCULAR | Status: AC
  Administered 2023-09-13: 80 mg via INTRAVENOUS
  Filled 2023-09-13: qty 8

## 2023-09-13 MED ORDER — NICOTINE 7 MG/24HR TD PT24
7.0000 mg | MEDICATED_PATCH | Freq: Every day | TRANSDERMAL | Status: DC
  Administered 2023-09-13: 7 mg via TRANSDERMAL
  Filled 2023-09-13: qty 1

## 2023-09-13 MED ORDER — EMPAGLIFLOZIN 10 MG PO TABS
10.0000 mg | ORAL_TABLET | Freq: Every day | ORAL | Status: DC
  Administered 2023-09-13 – 2023-09-14 (×2): 10 mg via ORAL
  Filled 2023-09-13 (×2): qty 1

## 2023-09-13 MED ORDER — NICOTINE POLACRILEX 2 MG MT GUM
2.0000 mg | CHEWING_GUM | OROMUCOSAL | Status: DC | PRN
  Administered 2023-09-13 (×2): 2 mg via ORAL
  Filled 2023-09-13 (×3): qty 1

## 2023-09-13 NOTE — Progress Notes (Signed)
 PROGRESS NOTE    Joseph Harvey  HYQ:[id number] DOB: June 17, 1955 DOA: 09/06/2023 PCP: Vevelyn Gowers, NP   Brief Narrative:  68 y.o. male with medical history significant for chronic heart failure with preserved EF, hypertrophic cardiomyopathy, hypertension, non-insulin -dependent type 2 diabetes, hyperlipidemia presented with worsening lower extremity edema along with left lower extremity redness and was admitted for acute on chronic CHF along with left lower extremity cellulitis.  He was started on IV Lasix  and antibiotics.  Cardiology consulted.  Assessment & Plan:   Acute on chronic diastolic heart failure History of hypertrophic cardiomyopathy Hypertension Hyperlipidemia -Continue strict input and output.  Daily weights.  Fluid restriction.  Negative balance of 4581 cc since admission.   -Cardiology following: Diuretics as per cardiology.  Continue irbesartan , metoprolol  succinate.  Continue statin - Still looks volume overloaded.  Left lower extremity cellulitis Bilateral lymphedema -Patient has chronic bilateral lymphedema.  Continue TED hose for right lower extremity.  Will need referral to lymphedema clinic at discharge - Cellulitis improving.  Continue 7 days of IV Ancef .  Venous duplex negative for DVT.  AKI has been ruled out - Creatinine 1.39 today.  Close to his baseline.  Monitor  Hypokalemia - Resolved  Hyponatremia - Mild.  Monitor  Anemia of chronic disease -From chronic illnesses.  Hemoglobin stable.  Monitor intermittently  Diabetes mellitus type 2 with hyperglycemia -Continue long-acting insulin  along with CBGs with SSI.  Carb modified diet  GERD - Continue Protonix   Chronic back pain/slipped disc - Continue current pain management.  Outpatient follow-up with PCP  Obesity class I -Outpatient follow-up     DVT prophylaxis: Heparin  subcutaneous Code Status: Full Family Communication: None at bedside Disposition Plan: Status is: Inpatient Remains  inpatient appropriate because: Of severity of illness  Consultants: Cardiology  Procedures: None  Antimicrobials:  Anti-infectives (From admission, onward)    Start     Dose/Rate Route Frequency Ordered Stop   09/07/23 1100  ceFAZolin  (ANCEF ) IVPB 2g/100 mL premix        2 g 200 mL/hr over 30 Minutes Intravenous Every 8 hours 09/07/23 0852     09/07/23 0245  ceFAZolin  (ANCEF ) IVPB 2g/100 mL premix        2 g 200 mL/hr over 30 Minutes Intravenous  Once 09/07/23 0232 09/07/23 0524        Subjective: Patient seen and examined at bedside.  Still has lower extremity swelling but improving.  Denies chest pain, fever, worsening shortness of breath.  Objective: Vitals:   09/12/23 2058 09/12/23 2100 09/13/23 0541 09/13/23 0826  BP:   (!) 141/92   Pulse:   (!) 107   Resp:   20   Temp:   97.7 F (36.5 C)   TempSrc:      SpO2: 98% 98% 100% 100%  Weight:      Height:        Intake/Output Summary (Last 24 hours) at 09/13/2023 0943 Last data filed at 09/13/2023 0800 Gross per 24 hour  Intake 1152.78 ml  Output 900 ml  Net 252.78 ml   Filed Weights   09/06/23 2303 09/07/23 0018 09/07/23 1312  Weight: 108.9 kg 116.5 kg 106.8 kg    Examination:  General exam: Appears calm and comfortable.  On room air. Respiratory system: Bilateral decreased breath sounds at bases with scattered crackles Cardiovascular system: S1 & S2 heard, mild intermittent tachycardia present Gastrointestinal system: Abdomen is obese, nondistended, soft and nontender. Normal bowel sounds heard. Extremities: No cyanosis, clubbing; bilateral lower extremity 2+ edema  present Central nervous system: Alert and oriented. No focal neurological deficits. Moving extremities Skin: Left lower extremity erythema with mid shin dressing present  psychiatry: Judgement and insight appear normal. Mood & affect appropriate.     Data Reviewed: I have personally reviewed following labs and imaging studies  CBC: Recent  Labs  Lab 09/06/23 2307 09/08/23 0348 09/11/23 0330  WBC 7.2 7.0 5.4  NEUTROABS 5.0  --   --   HGB 12.7* 12.4* 11.3*  HCT 36.6* 38.3* 34.8*  MCV 93.8 98.5 97.5  PLT 354 290 300   Basic Metabolic Panel: Recent Labs  Lab 09/09/23 0401 09/10/23 0407 09/11/23 0330 09/12/23 0352 09/13/23 0414  NA 133* 134* 136 135 133*  K 4.2 3.4* 3.8 3.3* 3.8  CL 93* 94* 97* 97* 95*  CO2 26 27 25 25 24   GLUCOSE 270* 257* 231* 239* 187*  BUN 26* 33* 27* 37* 34*  CREATININE 1.30* 1.25* 1.16 1.36* 1.39*  CALCIUM  9.0 8.6* 8.7* 8.7* 9.1  MG 2.1 2.3 2.3  --   --    GFR: Estimated Creatinine Clearance: 62.9 mL/min (A) (by C-G formula based on SCr of 1.39 mg/dL (H)). Liver Function Tests: Recent Labs  Lab 09/06/23 2307  AST 23  ALT 36  ALKPHOS 86  BILITOT 0.5  PROT 7.0  ALBUMIN 4.4   No results for input(s): "LIPASE", "AMYLASE" in the last 168 hours. No results for input(s): "AMMONIA" in the last 168 hours. Coagulation Profile: No results for input(s): "INR", "PROTIME" in the last 168 hours. Cardiac Enzymes: No results for input(s): "CKTOTAL", "CKMB", "CKMBINDEX", "TROPONINI" in the last 168 hours. BNP (last 3 results) Recent Labs    09/06/23 2307  PROBNP 524.0*   HbA1C: No results for input(s): "HGBA1C" in the last 72 hours. CBG: Recent Labs  Lab 09/12/23 0736 09/12/23 1115 09/12/23 1620 09/12/23 2052 09/13/23 0750  GLUCAP 204* 258* 228* 149* 195*   Lipid Profile: No results for input(s): "CHOL", "HDL", "LDLCALC", "TRIG", "CHOLHDL", "LDLDIRECT" in the last 72 hours. Thyroid Function Tests: No results for input(s): "TSH", "T4TOTAL", "FREET4", "T3FREE", "THYROIDAB" in the last 72 hours. Anemia Panel: No results for input(s): "VITAMINB12", "FOLATE", "FERRITIN", "TIBC", "IRON", "RETICCTPCT" in the last 72 hours. Sepsis Labs: No results for input(s): "PROCALCITON", "LATICACIDVEN" in the last 168 hours.  Recent Results (from the past 240 hours)  MRSA Next Gen by PCR, Nasal      Status: None   Collection Time: 09/09/23 11:35 AM   Specimen: Nasal Mucosa; Nasal Swab  Result Value Ref Range Status   MRSA by PCR Next Gen NOT DETECTED NOT DETECTED Final    Comment: (NOTE) The GeneXpert MRSA Assay (FDA approved for NASAL specimens only), is one component of a comprehensive MRSA colonization surveillance program. It is not intended to diagnose MRSA infection nor to guide or monitor treatment for MRSA infections. Test performance is not FDA approved in patients less than 60 years old. Performed at [facility], [address], [place], [place] [address]          Radiology Studies: No results found.      Scheduled Meds:  aspirin  EC  81 mg Oral Daily   budeson-glycopyrrolate -formoterol   2 puff Inhalation QHS   calcitonin (salmon)  1 spray Alternating Nares Daily   cholecalciferol   400 Units Oral Daily   dextromethorphan -guaiFENesin   1 tablet Oral BID   furosemide   80 mg Intravenous BID   heparin   5,000 Units Subcutaneous Q8H   insulin  aspart  0-15 Units Subcutaneous TID WC   insulin  aspart  0-5 Units Subcutaneous QHS   insulin  glargine-yfgn  10 Units Subcutaneous QHS   ipratropium-albuterol   3 mL Nebulization BID   irbesartan   300 mg Oral Daily   linaclotide   145 mcg Oral Daily   loratadine   10 mg Oral Daily   metoprolol  succinate  200 mg Oral Daily   nicotine   14 mg Transdermal Daily   pantoprazole   40 mg Oral BID   rosuvastatin   20 mg Oral Daily   Continuous Infusions:   ceFAZolin  (ANCEF ) IV 2 g (09/13/23 0316)          Audria Leather, MD [facility] 09/13/2023, 9:43 AM

## 2023-09-13 NOTE — Progress Notes (Addendum)
 Patient Name: Joseph Harvey Date of Encounter: 09/13/2023 [facility] Cardiologist: Wendie Hamburg, MD   Interval Summary  .   Patient has a history of HOCM hypertension, chronic diastolic CHF, PVC ablation, hyperlipidemia, diabetes, OSA on CPAP. Admitted with several month history of shortness of breath, lower extremity edema and CHF.   Currently on IV diuretics and put out 900 cc yesterday and is net -4.3 L since admission.  I's and O's are not accurate as patient has been flushing some of his urine into the toilet without being recorded no weight recorded Vital Signs .    Vitals:   09/12/23 2058 09/12/23 2100 09/13/23 0541 09/13/23 0826  BP:   (!) 141/92   Pulse:   (!) 107   Resp:   20   Temp:   97.7 F (36.5 C)   TempSrc:      SpO2: 98% 98% 100% 100%  Weight:      Height:        Intake/Output Summary (Last 24 hours) at 09/13/2023 1048 Last data filed at 09/13/2023 0800 Gross per 24 hour  Intake 1152.78 ml  Output 900 ml  Net 252.78 ml      09/07/2023    1:12 PM 09/07/2023   12:18 AM 09/06/2023   11:03 PM  Last 3 Weights  Weight (lbs) 235 lb 8 oz 256 lb 13.4 oz 240 lb  Weight (kg) 106.822 kg 116.5 kg 108.863 kg      Telemetry/ECG    Normal sinus rhythm- personally Reviewed  Physical Exam .   GEN: Well nourished, well developed in no acute distress HEENT: Normal NECK: No JVD; No carotid bruits LYMPHATICS: No lymphadenopathy CARDIAC:RRR, no murmurs, rubs, gallops RESPIRATORY:  Clear to auscultation without rales, wheezing or rhonchi  ABDOMEN: Soft, non-tender, non-distended MUSCULOSKELETAL:  1-2+ pedal edema; No deformity  SKIN: Warm and dry NEUROLOGIC:  Alert and oriented x 3 PSYCHIATRIC:  Normal affect  Assessment & Plan .     Coronary artery calcifications Hyperlipidemia Shortness of breath Hypertension Coronary calcium  score 2021 was 117 Denies chest pain but shortness of breath could be an anginal equivalent He is scheduled for  stress PET/CT 09/26/2023 Continue aspirin  81 mg daily, Toprol -XL 200 mg daily and Crestor  20 mg daily  Check FLP in a.m.   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), patchy LGE in basal septum and RV insertion sites consistent with HOCM 2D echo 08/03/2023 with EF greater than 75%, moderate LVH, G1DD and peak LVOT velocity 2.4 mm/s Presented with a 81-month history of right sided heart failure symptoms including lower extremity edema, 11 pound weight gain and shortness of breath BNP 524 and chest x-ray with streaky lingular opacity consistent with atelectasis versus pneumonia Currently on a Lasix  60 mg IV twice daily UOP net -4.3 L since admission and 900 cc out yesterday>> this is inaccurate as patient has been flushing some of his urine down the toilet without being measured>> Weight down 21 lbs since admission Serum creatinine bumped from 1.16->>1.36 >>1.39 Lower extremity significantly improved today Will give IV Lasix  again today and likely transition to p.o. Demadex tomorrow which I think will give him better absorption  GDMT: Continue Lasix  80 mg IV twice daily Continue irbesartan  300 mg daily Continue Toprol  XL 200 mg daily Add Jardiance 10 mg daily   LLE cellulitis Per TRH   I spent 35 minutes caring for this patient today face to face, ordering  and reviewing labs, reviewing records from 2D echo, cMRI , seeing the patient, documenting in the record  For questions or updates, please contact [facility] Please consult [email or web address] for contact info under        Signed, Gaylyn Keas, MD

## 2023-09-14 DIAGNOSIS — I421 Obstructive hypertrophic cardiomyopathy: Secondary | ICD-10-CM

## 2023-09-14 DIAGNOSIS — R0602 Shortness of breath: Secondary | ICD-10-CM

## 2023-09-14 DIAGNOSIS — I5033 Acute on chronic diastolic (congestive) heart failure: Secondary | ICD-10-CM | POA: Diagnosis not present

## 2023-09-14 DIAGNOSIS — E7800 Pure hypercholesterolemia, unspecified: Secondary | ICD-10-CM

## 2023-09-14 LAB — BASIC METABOLIC PANEL WITH GFR
Anion gap: 15 (ref 5–15)
BUN: 33 mg/dL — ABNORMAL HIGH (ref 8–23)
CO2: 27 mmol/L (ref 22–32)
Calcium: 9.6 mg/dL (ref 8.9–10.3)
Chloride: 93 mmol/L — ABNORMAL LOW (ref 98–111)
Creatinine, Ser: 1.13 mg/dL (ref 0.61–1.24)
GFR, Estimated: >60 mL/min (ref >60)
Glucose, Bld: 182 mg/dL — ABNORMAL HIGH (ref 70–99)
Potassium: 3.4 mmol/L — ABNORMAL LOW (ref 3.5–5.1)
Sodium: 135 mmol/L (ref 135–145)

## 2023-09-14 LAB — GLUCOSE, CAPILLARY
Glucose-Capillary: 203 mg/dL — ABNORMAL HIGH (ref 70–99)
Glucose-Capillary: 174 mg/dL — ABNORMAL HIGH (ref 70–99)
Glucose-Capillary: 160 mg/dL — ABNORMAL HIGH (ref 70–99)

## 2023-09-14 LAB — LIPID PANEL
Cholesterol: 154 mg/dL (ref 0–200)
HDL: 44 mg/dL (ref >40)
LDL Cholesterol: 31 mg/dL (ref 0–99)
Total CHOL/HDL Ratio: 3.5 ratio
Triglycerides: 393 mg/dL — ABNORMAL HIGH (ref <150)
VLDL: 79 mg/dL — ABNORMAL HIGH (ref 0–40)

## 2023-09-14 LAB — MAGNESIUM: Magnesium: 2.4 mg/dL (ref 1.7–2.4)

## 2023-09-14 MED ORDER — GABAPENTIN 300 MG PO CAPS: 300.0000 mg | ORAL_CAPSULE | Freq: Every day | ORAL | Status: AC | PRN

## 2023-09-14 MED ORDER — POTASSIUM CHLORIDE CRYS ER 20 MEQ PO TBCR
40.0000 meq | EXTENDED_RELEASE_TABLET | Freq: Every day | ORAL | Status: DC
  Administered 2023-09-14: 40 meq via ORAL
  Filled 2023-09-14: qty 2

## 2023-09-14 MED ORDER — BUSPIRONE HCL 10 MG PO TABS: 10.0000 mg | ORAL_TABLET | ORAL | Status: AC | PRN

## 2023-09-14 MED ORDER — TORSEMIDE 20 MG PO TABS: 40.0000 mg | ORAL_TABLET | Freq: Two times a day (BID) | ORAL | Status: DC

## 2023-09-14 MED ORDER — POTASSIUM CHLORIDE CRYS ER 20 MEQ PO TBCR
40.0000 meq | EXTENDED_RELEASE_TABLET | Freq: Every day | ORAL | 0 refills | Status: AC
Start: 2023-09-14 — End: ?

## 2023-09-14 MED ORDER — TORSEMIDE 60 MG PO TABS: 60.0000 mg | ORAL_TABLET | Freq: Two times a day (BID) | ORAL | 0 refills | Status: DC

## 2023-09-14 MED ORDER — EZETIMIBE 10 MG PO TABS: 10.0000 mg | ORAL_TABLET | Freq: Every evening | ORAL | Status: AC

## 2023-09-14 MED ORDER — MENTHOL 3 MG MT LOZG
1.0000 | LOZENGE | OROMUCOSAL | Status: DC | PRN
  Administered 2023-09-14: 3 mg via ORAL
  Filled 2023-09-14: qty 9

## 2023-09-14 MED ORDER — EMPAGLIFLOZIN 10 MG PO TABS: 10.0000 mg | ORAL_TABLET | Freq: Every day | ORAL | 0 refills | Status: AC

## 2023-09-14 MED ORDER — CYCLOBENZAPRINE HCL 10 MG PO TABS: 10.0000 mg | ORAL_TABLET | Freq: Three times a day (TID) | ORAL | Status: AC | PRN

## 2023-09-14 MED ORDER — METOPROLOL SUCCINATE ER 100 MG PO TB24: 200.0000 mg | ORAL_TABLET | Freq: Every day | ORAL | 0 refills | Status: DC

## 2023-09-14 MED ORDER — MONTELUKAST SODIUM 10 MG PO TABS: 10.0000 mg | ORAL_TABLET | Freq: Every evening | ORAL | Status: AC | PRN

## 2023-09-14 MED ORDER — SILVER SULFADIAZINE 1 % EX CREA: 1.0000 | TOPICAL_CREAM | CUTANEOUS | Status: AC | PRN

## 2023-09-14 MED ORDER — TORSEMIDE 20 MG PO TABS
60.0000 mg | ORAL_TABLET | Freq: Two times a day (BID) | ORAL | Status: DC
  Filled 2023-09-14: qty 3

## 2023-09-14 NOTE — Progress Notes (Signed)
 Heart Failure Nurse Navigator Progress Note    Called patient and left a voicemail, asking him to call the Heart Failure Clinic to schedule a hospital follow up visit per Dr. Micael Adas. [facility] [phone]) . Patient was not seen by HF Navigator in hospital.     Randie Bustle, BSN, RN Heart Failure Nurse Navigator Secure Chat Only

## 2023-09-14 NOTE — Plan of Care (Signed)

## 2023-09-14 NOTE — Plan of Care (Signed)
 Problem: Education: Goal: Knowledge of General Education information will improve Description: Including pain rating scale, medication(s)/side effects and non-pharmacologic comfort measures 09/14/2023 1237 by Coletta Davidson, RN Outcome: Adequate for Discharge 09/14/2023 1046 by Coletta Davidson, RN Outcome: Progressing   Problem: Health Behavior/Discharge Planning: Goal: Ability to manage health-related needs will improve 09/14/2023 1237 by Coletta Davidson, RN Outcome: Adequate for Discharge 09/14/2023 1046 by Coletta Davidson, RN Outcome: Progressing   Problem: Clinical Measurements: Goal: Ability to maintain clinical measurements within normal limits will improve 09/14/2023 1237 by Coletta Davidson, RN Outcome: Adequate for Discharge 09/14/2023 1046 by Coletta Davidson, RN Outcome: Progressing Goal: Will remain free from infection 09/14/2023 1237 by Coletta Davidson, RN Outcome: Adequate for Discharge 09/14/2023 1046 by Coletta Davidson, RN Outcome: Progressing Goal: Diagnostic test results will improve 09/14/2023 1237 by Coletta Davidson, RN Outcome: Adequate for Discharge 09/14/2023 1046 by Coletta Davidson, RN Outcome: Progressing Goal: Respiratory complications will improve 09/14/2023 1237 by Coletta Davidson, RN Outcome: Adequate for Discharge 09/14/2023 1046 by Coletta Davidson, RN Outcome: Progressing Goal: Cardiovascular complication will be avoided 09/14/2023 1237 by Coletta Davidson, RN Outcome: Adequate for Discharge 09/14/2023 1046 by Coletta Davidson, RN Outcome: Progressing   Problem: Activity: Goal: Risk for activity intolerance will decrease 09/14/2023 1237 by Coletta Davidson, RN Outcome: Adequate for Discharge 09/14/2023 1046 by Coletta Davidson, RN Outcome: Progressing   Problem: Nutrition: Goal: Adequate nutrition will be maintained 09/14/2023 1237 by Coletta Davidson, RN Outcome: Adequate for  Discharge 09/14/2023 1046 by Coletta Davidson, RN Outcome: Progressing   Problem: Coping: Goal: Level of anxiety will decrease 09/14/2023 1237 by Coletta Davidson, RN Outcome: Adequate for Discharge 09/14/2023 1046 by Coletta Davidson, RN Outcome: Progressing   Problem: Elimination: Goal: Will not experience complications related to bowel motility 09/14/2023 1237 by Coletta Davidson, RN Outcome: Adequate for Discharge 09/14/2023 1046 by Coletta Davidson, RN Outcome: Progressing Goal: Will not experience complications related to urinary retention 09/14/2023 1237 by Coletta Davidson, RN Outcome: Adequate for Discharge 09/14/2023 1046 by Coletta Davidson, RN Outcome: Progressing   Problem: Pain Managment: Goal: General experience of comfort will improve and/or be controlled 09/14/2023 1237 by Coletta Davidson, RN Outcome: Adequate for Discharge 09/14/2023 1046 by Coletta Davidson, RN Outcome: Progressing   Problem: Safety: Goal: Ability to remain free from injury will improve 09/14/2023 1237 by Coletta Davidson, RN Outcome: Adequate for Discharge 09/14/2023 1046 by Coletta Davidson, RN Outcome: Progressing   Problem: Skin Integrity: Goal: Risk for impaired skin integrity will decrease 09/14/2023 1237 by Coletta Davidson, RN Outcome: Adequate for Discharge 09/14/2023 1046 by Coletta Davidson, RN Outcome: Progressing   Problem: Education: Goal: Ability to describe self-care measures that may prevent or decrease complications (Diabetes Survival Skills Education) will improve 09/14/2023 1237 by Coletta Davidson, RN Outcome: Adequate for Discharge 09/14/2023 1046 by Coletta Davidson, RN Outcome: Progressing Goal: Individualized Educational Video(s) 09/14/2023 1237 by Coletta Davidson, RN Outcome: Adequate for Discharge 09/14/2023 1046 by Coletta Davidson, RN Outcome: Progressing   Problem: Coping: Goal: Ability to adjust to condition  or change in health will improve 09/14/2023 1237 by Coletta Davidson, RN Outcome: Adequate for Discharge 09/14/2023 1046 by Coletta Davidson, RN Outcome: Progressing   Problem: Fluid Volume: Goal: Ability to maintain a balanced intake and output will improve 09/14/2023 1237 by Coletta Davidson, RN Outcome: Adequate for Discharge 09/14/2023  1046 by Coletta Davidson, RN Outcome: Progressing   Problem: Health Behavior/Discharge Planning: Goal: Ability to identify and utilize available resources and services will improve 09/14/2023 1237 by Coletta Davidson, RN Outcome: Adequate for Discharge 09/14/2023 1046 by Coletta Davidson, RN Outcome: Progressing Goal: Ability to manage health-related needs will improve 09/14/2023 1237 by Coletta Davidson, RN Outcome: Adequate for Discharge 09/14/2023 1046 by Coletta Davidson, RN Outcome: Progressing   Problem: Metabolic: Goal: Ability to maintain appropriate glucose levels will improve 09/14/2023 1237 by Coletta Davidson, RN Outcome: Adequate for Discharge 09/14/2023 1046 by Coletta Davidson, RN Outcome: Progressing   Problem: Nutritional: Goal: Maintenance of adequate nutrition will improve 09/14/2023 1237 by Coletta Davidson, RN Outcome: Adequate for Discharge 09/14/2023 1046 by Coletta Davidson, RN Outcome: Progressing Goal: Progress toward achieving an optimal weight will improve 09/14/2023 1237 by Coletta Davidson, RN Outcome: Adequate for Discharge 09/14/2023 1046 by Coletta Davidson, RN Outcome: Progressing   Problem: Skin Integrity: Goal: Risk for impaired skin integrity will decrease 09/14/2023 1237 by Coletta Davidson, RN Outcome: Adequate for Discharge 09/14/2023 1046 by Coletta Davidson, RN Outcome: Progressing   Problem: Tissue Perfusion: Goal: Adequacy of tissue perfusion will improve 09/14/2023 1237 by Coletta Davidson, RN Outcome: Adequate for Discharge 09/14/2023 1046 by  Coletta Davidson, RN Outcome: Progressing

## 2023-09-14 NOTE — Progress Notes (Addendum)
 Patient Name: Joseph Harvey Date of Encounter: 09/14/2023  [facility] Cardiologist: Wendie Hamburg, MD   Interval Summary  .   Patient has a history of HOCM, hypertension, chronic diastolic CHF, PVC ablation, hyperlipidemia, diabetes, OSA on CPAP. Admitted with several month history of shortness of breath, lower extremity edema and significant right sided CHF.   Feeling great today and anxious to go home.  Already dressed for discharged.  No SOB.  LE significantly improved.  Vital Signs .    Vitals:   09/13/23 2023 09/13/23 2132 09/14/23 0501 09/14/23 0946  BP:  137/77 139/75   Pulse: (!) 115 (!) 105 (!) 101   Resp: 20 20 20    Temp:  98.4 F (36.9 C) 98.4 F (36.9 C)   TempSrc:  Oral Oral   SpO2: 98% 95% 97% 98%  Weight:   111.3 kg   Height:        Intake/Output Summary (Last 24 hours) at 09/14/2023 1115 Last data filed at 09/14/2023 0900 Gross per 24 hour  Intake 320 ml  Output 2525 ml  Net -2205 ml      09/14/2023    5:01 AM 09/07/2023    1:12 PM 09/07/2023   12:18 AM  Last 3 Weights  Weight (lbs) 245 lb 6 oz 235 lb 8 oz 256 lb 13.4 oz  Weight (kg) 111.3 kg 106.822 kg 116.5 kg      Telemetry/ECG    NSR -  personally Reviewed  Physical Exam .   GEN: Well nourished, well developed in no acute distress HEENT: Normal NECK: No JVD; No carotid bruits LYMPHATICS: No lymphadenopathy CARDIAC:RRR, no murmurs, rubs, gallops RESPIRATORY:  Clear to auscultation without rales, wheezing or rhonchi  ABDOMEN: Soft, non-tender, non-distended MUSCULOSKELETAL:  1-2+ pedal edema; No deformity  SKIN: Warm and dry NEUROLOGIC:  Alert and oriented x 3 PSYCHIATRIC:  Normal affect  Assessment & Plan .     Coronary artery calcifications Hyperlipidemia Shortness of breath Hypertension Coronary calcium  score 2021 was 117 Denies chest pain but shortness of breath could be an anginal equivalent He is scheduled for stress PET/CT 09/26/2023 Continue aspirin  81 mg daily,  Toprol -XL 200 mg daily and Crestor  20 mg daily  Lipids this a.m. 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), patchy LGE in basal septum and RV insertion sites consistent with HOCM 2D echo 08/03/2023 with EF greater than 75%, moderate LVH, G1DD and peak LVOT velocity 2.4 mm/s Presented with a 44-month history of right sided heart failure symptoms including lower extremity edema, 11 pound weight gain and shortness of breath BNP 524 and chest x-ray with streaky lingular opacity consistent with atelectasis versus pneumonia Currently on a Lasix  80 mg IV twice daily UOP net - 6.5 L since admission and 2.5 L out yesterday>> net output since admission is inaccurate as patient had been flushing some of his urine down the toilet without being measured Weight down 21 lbs since admission yesterday but then showed he gained 10 pounds today which I think is inaccurate given that he had significant urine output yesterday He has no shortness of breath and lungs are clear, he still has lower extremity edema but some of this is likely related to chronic venous stasis due to very protuberant abdomen.  He is otherwise asymptomatic and wants to go home Serum creatinine normalized to  1.13 today and potassium 3.4  Transition IV lasix  to Demadex  60 mg p.o. twice daily for better absorption GDMT: Demadex  60mg  twice daily Continue irbesartan  300 mg daily  Continue to Toprol  XL 200 mg daily  Continue Jardiance  10 mg daily  Encouraged him to wear compression hose during the day , LLE cellulitis Contributing to lower extremity edema of the left lower extremity  Per [facility]  [facility] will sign off.   Medication Recommendations: Aspirin  81 mg daily, Jardiance  10 mg daily, irbesartan  300 mg daily, Toprol -XL 200 mg daily, Crestor  20 mg daily,  Demadex  60 mg twice daily, Kdur 40meq daily Other recommendations (labs, testing, etc):  BMET in 1 week Follow up as an outpatient:  [facility] in 1 week   I spent 35 minutes caring for this patient today face to face, ordering and reviewing labs, reviewing records from 2D echo, cMRI , seeing the patient, documenting in the record  For questions or updates, please contact [facility] Please consult [email or web address] for contact info under        Signed, Gaylyn Keas, MD

## 2023-09-14 NOTE — Discharge Summary (Signed)
 Physician Discharge Summary  Joseph Harvey ZOX:[id number] DOB: 1955-12-05 DOA: 09/06/2023  PCP: Vevelyn Gowers, NP  Admit date: 09/06/2023 Discharge date: 09/14/2023  Admitted From: Home Disposition: Home  Recommendations for Outpatient Follow-up:  Follow up with PCP in 1 week with repeat CBC/BMP Outpatient follow-up with cardiology Follow up in ED if symptoms worsen or new appear   Home Health: No Equipment/Devices: None  Discharge Condition: Stable CODE STATUS: Full Diet recommendation: Heart healthy/carb modified  Brief/Interim Summary: 68 y.o. male with medical history significant for chronic heart failure with preserved EF, hypertrophic cardiomyopathy, hypertension, non-insulin -dependent type 2 diabetes, hyperlipidemia presented with worsening lower extremity edema along with left lower extremity redness and was admitted for acute on chronic CHF along with left lower extremity cellulitis.  He was started on IV Lasix  and antibiotics.  Cardiology consulted.  During the hospitalizing, his condition has much improved.  He has diuresed well.  He has completed 1 week of IV Ancef  and cellulitis has much improved: Does not need any more antibiotics on discharge.  Cardiology has switched him to oral diuretics and has cleared him for discharge home today.  Discharge patient home today with outpatient follow-up with PCP and cardiology.  Discharge Diagnoses:   Acute on chronic diastolic heart failure History of hypertrophic cardiomyopathy Hypertension Hyperlipidemia - Patient has diuresed well during his hospitalization.  Continue fluid and diet restriction.  Negative balance of 6666 cc since admission.   -Cardiology following: Received IV Lasix  during the hospitalization.  Cardiology has switched him to oral diuretics and has cleared him for discharge home today.  Discharge patient home today with outpatient follow-up with PCP and cardiology.  Continue torsemide  60 mg twice a day along with  Toprol -XL and valsartan .  Continue statin - Outpatient follow-up with cardiology.   Left lower extremity cellulitis Bilateral lymphedema -Patient has chronic bilateral lymphedema.  Continue TED hose for right lower extremity.  Will need referral to lymphedema clinic at discharge - He has completed 1 week of IV Ancef  and cellulitis has much improved: Does not need any more antibiotics on discharge. Venous duplex negative for DVT.   AKI has been ruled out - Creatinine 1.13 today.  Close to his baseline.  Outpatient follow-up.  Hypokalemia - Replace.  Continue replacement on discharge.  Outpatient follow-up   hyponatremia - Improved   Anemia of chronic disease -From chronic illnesses.  Hemoglobin stable.  Monitor intermittently as an outpatient   Diabetes mellitus type 2 with hyperglycemia - Resume primary.  Outpatient follow-up with PCP.  Carb modified diet   GERD - Continue PPI  Chronic back pain/slipped disc - Continue current pain management.  Outpatient follow-up with PCP   Obesity class I -Outpatient follow-up   Discharge Instructions  Discharge Instructions     Diet - low sodium heart healthy   Complete by: As directed    Discharge wound care:   Complete by: As directed    Apply single layer of xeroform to the blistered/open areas on the LEs, top with foam. Change daily   Increase activity slowly   Complete by: As directed       Allergies as of 09/14/2023       Reactions   Lisinopril Shortness Of Breath   Molds & Smuts Anaphylaxis   Dust Mite Extract         Medication List     STOP taking these medications    amLODipine  5 MG tablet Commonly known as: NORVASC    doxycycline  100 MG capsule Commonly known  as: VIBRAMYCIN    furosemide  40 MG tablet Commonly known as: LASIX    pantoprazole  40 MG tablet Commonly known as: PROTONIX    tiZANidine  4 MG tablet Commonly known as: ZANAFLEX        TAKE these medications    acetaminophen  500 MG  tablet Commonly known as: TYLENOL  Take 500-1,000 mg by mouth every 6 (six) hours as needed for mild pain (pain score 1-3) or moderate pain (pain score 4-6).   albuterol  108 (90 Base) MCG/ACT inhaler Commonly known as: VENTOLIN  HFA Inhale 2 puffs into the lungs every 4 hours as needed for shortness of breath or wheezing What changed: Another medication with the same name was changed. Make sure you understand how and when to take each.   albuterol  (2.5 MG/3ML) 0.083% nebulizer solution Commonly known as: PROVENTIL  TAKE 3 MLS BY NEBULIZATION EVERY 4 HOURS AS NEEDED FOR WHEEZING OR SHORTNESS OF BREATH (((PLAN B))) What changed: See the new instructions.   ASPIRIN  81 PO Take 81 mg by mouth daily.   Breztri  Aerosphere 160-9-4.8 MCG/ACT Aero inhaler Generic drug: budeson-glycopyrrolate -formoterol  Inhale 2 puffs into the lungs in the morning and at bedtime.   busPIRone  10 MG tablet Commonly known as: BUSPAR  Take 1 tablet (10 mg total) by mouth as needed (anxiety). What changed:  when to take this reasons to take this Another medication with the same name was removed. Continue taking this medication, and follow the directions you see here.   calcitonin (salmon) 200 UNIT/ACT nasal spray Commonly known as: MIACALCIN /FORTICAL Place 1 spray into alternate nostrils daily.   cyclobenzaprine  10 MG tablet Commonly known as: FLEXERIL  Take 1 tablet (10 mg total) by mouth 3 (three) times daily as needed for muscle spasms. What changed:  when to take this reasons to take this   Dupixent  300 MG/2ML Soaj Generic drug: Dupilumab  Inject 300 mg into the skin every 14 (fourteen) days. **loading dose completed in clinic on 12/15/22**   empagliflozin  10 MG Tabs tablet Commonly known as: JARDIANCE  Take 1 tablet (10 mg total) by mouth daily. Start taking on: Sep 15, 2023   ezetimibe  10 MG tablet Commonly known as: ZETIA  Take 1 tablet (10 mg total) by mouth every evening.   famotidine  20 MG  tablet Commonly known as: PEPCID  Take 20-40 mg by mouth daily as needed for heartburn or indigestion.   gabapentin  300 MG capsule Commonly known as: NEURONTIN  Take 1 capsule (300 mg total) by mouth daily as needed (Nerve pain).   ipratropium 0.06 % nasal spray Commonly known as: ATROVENT  Place 2 sprays into both nostrils 3 (three) times daily. As needed for nasal congestion, runny nose   ipratropium-albuterol  0.5-2.5 (3) MG/3ML Soln Commonly known as: DUONEB INHALE 1 VIAL VIA NEBULIZER TWICE A DAY   Linzess  145 MCG Caps capsule Generic drug: linaclotide  Take 145 mcg by mouth daily.   magnesium  oxide 400 (240 Mg) MG tablet Commonly known as: MAGnesium -Oxide Take 1 tablet (400 mg total) by mouth daily.   metFORMIN  500 MG 24 hr tablet Commonly known as: GLUCOPHAGE -XR Take 2 tablets (1,000 mg total) by mouth in the morning AND 1 tablet (500 mg total) every evening with meals.   metoprolol  succinate 100 MG 24 hr tablet Commonly known as: TOPROL -XL Take 2 tablets (200 mg total) by mouth daily. Take with or immediately following a meal. What changed:  how much to take Another medication with the same name was removed. Continue taking this medication, and follow the directions you see here.   montelukast  10 MG  tablet Commonly known as: SINGULAIR  Take 1 tablet (10 mg total) by mouth at bedtime as needed (allergies).   Mucus Relief DM 30-600 MG Tb12 Take 1 tablet by mouth 2 (two) times daily. What changed:  when to take this reasons to take this   nicotine  7 mg/24hr patch Commonly known as: NICODERM CQ  - dosed in mg/24 hr Place 1 patch (7 mg total) onto the skin daily.   omeprazole 40 MG capsule Commonly known as: PRILOSEC Take 40 mg by mouth as needed.   OXYGEN Inhale 3 L into the lungs See admin instructions. Inhale 3L when needed unless overexertion, then use continuously.   potassium chloride  SA 20 MEQ tablet Commonly known as: KLOR-CON  M Take 2 tablets (40 mEq  total) by mouth daily. What changed: how much to take   predniSONE  10 MG tablet Commonly known as: DELTASONE  Take 30 mg by mouth daily as needed.   rosuvastatin  20 MG tablet Commonly known as: CRESTOR  Take 1 tablet (20 mg total) by mouth daily.   silver  sulfADIAZINE  1 % cream Commonly known as: Silvadene  Apply 1 Application topically as needed (rash).   Torsemide  60 MG Tabs Take 60 mg by mouth 2 (two) times daily.   traMADol  50 MG tablet Commonly known as: ULTRAM  Take 1 tablet (50 mg total) by mouth every evening. Each prescription to last 1 mth What changed: additional instructions   True Metrix Blood Glucose Test test strip Generic drug: glucose blood Use as instructed   TRUEplus Lancets 28G Misc Use as directed   valsartan  320 MG tablet Commonly known as: DIOVAN  Take 1 tablet (320 mg total) by mouth daily.   VITAMIN D PO Take 1 tablet by mouth daily.               Discharge Care Instructions  (From admission, onward)           Start     Ordered   09/14/23 0000  Discharge wound care:       Comments: Apply single layer of xeroform to the blistered/open areas on the LEs, top with foam. Change daily   09/14/23 1152            Follow-up Information     Vevelyn Gowers, NP. Schedule an appointment as soon as possible for a visit in 1 week(s).   Specialty: Nurse Practitioner Why: with repeat bmp Contact information: [address] [place] [address] [phone]                Allergies  Allergen Reactions   Lisinopril Shortness Of Breath   Molds & Smuts Anaphylaxis   Dust Mite Extract     Consultations: Cardiology   Procedures/Studies: DG Ankle Complete Left Result Date: 09/07/2023 CLINICAL DATA:  Atraumatic left ankle pain and swelling. EXAM: LEFT ANKLE COMPLETE - 3+ VIEW COMPARISON:  None Available. FINDINGS: There is no evidence of fracture, dislocation, or joint effusion. There is no evidence of arthropathy or other  focal bone abnormality. Marked severity diffuse soft tissue swelling is seen. IMPRESSION: Marked severity diffuse soft tissue swelling without evidence of an acute osseous abnormality. Electronically Signed   By: Virgle Grime M.D.   On: 09/07/2023 02:55   US  Venous Img Lower  Left (DVT Study) Result Date: 09/07/2023 CLINICAL DATA:  Left lower leg swelling, pain and redness. EXAM: LEFT LOWER EXTREMITY VENOUS DOPPLER ULTRASOUND TECHNIQUE: Gray-scale sonography with graded compression, as well as color Doppler and duplex ultrasound were performed to evaluate the lower extremity  deep venous systems from the level of the common femoral vein and including the common femoral, femoral, profunda femoral, popliteal and calf veins including the posterior tibial, peroneal and gastrocnemius veins when visible. The superficial great saphenous vein was also interrogated. Spectral Doppler was utilized to evaluate flow at rest and with distal augmentation maneuvers in the common femoral, femoral and popliteal veins. COMPARISON:  None Available. FINDINGS: Contralateral Common Femoral Vein: Respiratory phasicity is normal and symmetric with the symptomatic side. No evidence of thrombus. Normal compressibility. Common Femoral Vein: No evidence of thrombus. Normal compressibility, respiratory phasicity and response to augmentation. Saphenofemoral Junction: No evidence of thrombus. Normal compressibility and flow on color Doppler imaging. Profunda Femoral Vein: No evidence of thrombus. Normal compressibility and flow on color Doppler imaging. Femoral Vein: No evidence of thrombus. Normal compressibility, respiratory phasicity and response to augmentation. Popliteal Vein: No evidence of thrombus. Normal compressibility, respiratory phasicity and response to augmentation. Calf Veins: No evidence of thrombus. Normal compressibility and flow on color Doppler imaging. Superficial Great Saphenous Vein: No evidence of thrombus. Normal  compressibility. Venous Reflux:  None. Other Findings:  None. IMPRESSION: No evidence of deep venous thrombosis within the LEFT lower extremity. Electronically Signed   By: Virgle Grime M.D.   On: 09/07/2023 01:43   DG Chest 2 View Result Date: 09/06/2023 CLINICAL DATA:  Shortness of breath EXAM: CHEST - 2 VIEW COMPARISON:  07/02/2023 FINDINGS: Streaky lingular opacity.  Normal cardiac size.  No pneumothorax IMPRESSION: Streaky lingular opacity, atelectasis versus pneumonia. Electronically Signed   By: Esmeralda Hedge M.D.   On: 09/06/2023 23:47      Subjective: Patient seen and examined at bedside.  Lower extremity swelling is slightly improving.  Denies worsening chest pain or shortness of breath.  No fever reported.  Wants to go home today  Discharge Exam: Vitals:   09/14/23 0501 09/14/23 0946  BP: 139/75   Pulse: (!) 101   Resp: 20   Temp: 98.4 F (36.9 C)   SpO2: 97% 98%    General: Looks chronically ill and deconditioned. No distress ENT/neck: No thyromegaly.  JVD is not elevated  respiratory: Decreased breath sounds at bases bilaterally with some crackles; no wheezing  CVS: S1-S2 heard, tachycardic  abdominal: Soft, obese, nontender, slightly distended; no organomegaly, bowel sounds are heard Extremities: Bilateral lower extremity edema present; no cyanosis  CNS: Awake and alert.  No focal neurologic deficit.  Moves extremities Lymph: No obvious lymphadenopathy Skin: Left lower extremity erythema with mid shin dressing present  psych: Affect, judgment and mood are normal  musculoskeletal: No obvious joint swelling/deformity    The results of significant diagnostics from this hospitalization (including imaging, microbiology, ancillary and laboratory) are listed below for reference.     Microbiology: Recent Results (from the past 240 hours)  MRSA Next Gen by PCR, Nasal     Status: None   Collection Time: 09/09/23 11:35 AM   Specimen: Nasal Mucosa; Nasal Swab   Result Value Ref Range Status   MRSA by PCR Next Gen NOT DETECTED NOT DETECTED Final    Comment: (NOTE) The GeneXpert MRSA Assay (FDA approved for NASAL specimens only), is one component of a comprehensive MRSA colonization surveillance program. It is not intended to diagnose MRSA infection nor to guide or monitor treatment for MRSA infections. Test performance is not FDA approved in patients less than 68 years old. Performed at [facility], [address] [place], [place] [address]      Labs: BNP (  last 3 results) Recent Labs    06/19/23 1412 06/20/23 0651 07/02/23 1718  BNP 50.7 70.4 97.8   Basic Metabolic Panel: Recent Labs  Lab 09/09/23 0401 09/10/23 0407 09/11/23 0330 09/12/23 0352 09/13/23 0414 09/14/23 0420  NA 133* 134* 136 135 133* 135  K 4.2 3.4* 3.8 3.3* 3.8 3.4*  CL 93* 94* 97* 97* 95* 93*  CO2 26 27 25 25 24 27   GLUCOSE 270* 257* 231* 239* 187* 182*  BUN 26* 33* 27* 37* 34* 33*  CREATININE 1.30* 1.25* 1.16 1.36* 1.39* 1.13  CALCIUM  9.0 8.6* 8.7* 8.7* 9.1 9.6  MG 2.1 2.3 2.3  --   --  2.4   Liver Function Tests: No results for input(s): "AST", "ALT", "ALKPHOS", "BILITOT", "PROT", "ALBUMIN" in the last 168 hours. No results for input(s): "LIPASE", "AMYLASE" in the last 168 hours. No results for input(s): "AMMONIA" in the last 168 hours. CBC: Recent Labs  Lab 09/08/23 0348 09/11/23 0330  WBC 7.0 5.4  HGB 12.4* 11.3*  HCT 38.3* 34.8*  MCV 98.5 97.5  PLT 290 300   Cardiac Enzymes: No results for input(s): "CKTOTAL", "CKMB", "CKMBINDEX", "TROPONINI" in the last 168 hours. BNP: Invalid input(s): "POCBNP" CBG: Recent Labs  Lab 09/13/23 1152 09/13/23 1547 09/13/23 2129 09/14/23 0749 09/14/23 1148  GLUCAP 143* 199* 203* 174* 160*   D-Dimer No results for input(s): "DDIMER" in the last 72 hours. Hgb A1c No results for input(s): "HGBA1C" in the last 72 hours. Lipid Profile Recent Labs    09/14/23 0420  CHOL 154  HDL  44  LDLCALC 31  TRIG 914*  CHOLHDL 3.5   Thyroid function studies No results for input(s): "TSH", "T4TOTAL", "T3FREE", "THYROIDAB" in the last 72 hours.  Invalid input(s): "FREET3" Anemia work up No results for input(s): "VITAMINB12", "FOLATE", "FERRITIN", "TIBC", "IRON", "RETICCTPCT" in the last 72 hours. Urinalysis No results found for: "COLORURINE", "APPEARANCEUR", "LABSPEC", "PHURINE", "GLUCOSEU", "HGBUR", "BILIRUBINUR", "KETONESUR", "PROTEINUR", "UROBILINOGEN", "NITRITE", "LEUKOCYTESUR" Sepsis Labs Recent Labs  Lab 09/08/23 0348 09/11/23 0330  WBC 7.0 5.4   Microbiology Recent Results (from the past 240 hours)  MRSA Next Gen by PCR, Nasal     Status: None   Collection Time: 09/09/23 11:35 AM   Specimen: Nasal Mucosa; Nasal Swab  Result Value Ref Range Status   MRSA by PCR Next Gen NOT DETECTED NOT DETECTED Final    Comment: (NOTE) The GeneXpert MRSA Assay (FDA approved for NASAL specimens only), is one component of a comprehensive MRSA colonization surveillance program. It is not intended to diagnose MRSA infection nor to guide or monitor treatment for MRSA infections. Test performance is not FDA approved in patients less than 18 years old. Performed at [facility], [address], [place], [place] [address]      Time coordinating discharge: 35 minutes  SIGNED:   Audria Leather, MD  [facility] 09/14/2023, 11:54 AM

## 2023-09-14 NOTE — Plan of Care (Signed)
  Problem: Education: Goal: Knowledge of General Education information will improve Description: Including pain rating scale, medication(s)/side effects and non-pharmacologic comfort measures Outcome: Progressing   Problem: Health Behavior/Discharge Planning: Goal: Ability to manage health-related needs will improve Outcome: Progressing   Problem: Coping: Goal: Level of anxiety will decrease Outcome: Progressing   Problem: Fluid Volume: Goal: Ability to maintain a balanced intake and output will improve Outcome: Progressing

## 2023-09-18 ENCOUNTER — Telehealth (HOSPITAL_COMMUNITY): Payer: Self-pay | Admitting: *Deleted

## 2023-09-18 NOTE — Telephone Encounter (Signed)
 Pt was referred to f/u with [facility] post hosp per Dr Micael Adas, pt returned call to sch, he states he has been urinated frequently and feels slight dizziness at times, reports BP has been running good around 120 range, TOC appt sch for Wed 5/7, he is aware of location and to bring all medications

## 2023-09-19 ENCOUNTER — Telehealth (HOSPITAL_COMMUNITY): Payer: Self-pay

## 2023-09-19 NOTE — Telephone Encounter (Signed)
 Called to confirm/remind patient of their appointment at the [facility] on 09/20/2023 9:00.   Appointment:   [] Confirmed  [] Left mess   [] No answer/No voice mail  [] VM Full/unable to leave message  [] Phone not in service  Patient reminded to bring all medications and/or complete list.  Confirmed patient has transportation. Gave directions, instructed to utilize valet parking.

## 2023-09-20 ENCOUNTER — Ambulatory Visit (HOSPITAL_COMMUNITY)
Admission: RE | Admit: 2023-09-20 | Discharge: 2023-09-20 | Disposition: A | Discharge status: Home / Self Care | Source: Ambulatory Visit | Attending: Physician Assistant | Admitting: Physician Assistant

## 2023-09-20 ENCOUNTER — Encounter (HOSPITAL_COMMUNITY): Payer: Self-pay

## 2023-09-20 VITALS — BP 111/67 | HR 122 | Ht 69.0 in | Wt 241.2 lb

## 2023-09-20 DIAGNOSIS — I951 Orthostatic hypotension: Secondary | ICD-10-CM | POA: Diagnosis not present

## 2023-09-20 DIAGNOSIS — I5033 Acute on chronic diastolic (congestive) heart failure: Secondary | ICD-10-CM

## 2023-09-20 DIAGNOSIS — J45909 Unspecified asthma, uncomplicated: Secondary | ICD-10-CM | POA: Insufficient documentation

## 2023-09-20 DIAGNOSIS — Z9981 Dependence on supplemental oxygen: Secondary | ICD-10-CM | POA: Insufficient documentation

## 2023-09-20 DIAGNOSIS — I493 Ventricular premature depolarization: Secondary | ICD-10-CM | POA: Diagnosis not present

## 2023-09-20 DIAGNOSIS — Z7984 Long term (current) use of oral hypoglycemic drugs: Secondary | ICD-10-CM | POA: Insufficient documentation

## 2023-09-20 DIAGNOSIS — I421 Obstructive hypertrophic cardiomyopathy: Secondary | ICD-10-CM

## 2023-09-20 DIAGNOSIS — I422 Other hypertrophic cardiomyopathy: Secondary | ICD-10-CM | POA: Insufficient documentation

## 2023-09-20 DIAGNOSIS — Z720 Tobacco use: Secondary | ICD-10-CM

## 2023-09-20 DIAGNOSIS — E119 Type 2 diabetes mellitus without complications: Secondary | ICD-10-CM | POA: Diagnosis not present

## 2023-09-20 DIAGNOSIS — I110 Hypertensive heart disease with heart failure: Secondary | ICD-10-CM | POA: Diagnosis not present

## 2023-09-20 DIAGNOSIS — E785 Hyperlipidemia, unspecified: Secondary | ICD-10-CM | POA: Insufficient documentation

## 2023-09-20 DIAGNOSIS — R0789 Other chest pain: Secondary | ICD-10-CM | POA: Diagnosis not present

## 2023-09-20 DIAGNOSIS — Z8546 Personal history of malignant neoplasm of prostate: Secondary | ICD-10-CM | POA: Diagnosis not present

## 2023-09-20 DIAGNOSIS — F17210 Nicotine dependence, cigarettes, uncomplicated: Secondary | ICD-10-CM | POA: Insufficient documentation

## 2023-09-20 DIAGNOSIS — R000 Tachycardia, unspecified: Secondary | ICD-10-CM | POA: Diagnosis not present

## 2023-09-20 DIAGNOSIS — G4733 Obstructive sleep apnea (adult) (pediatric): Secondary | ICD-10-CM | POA: Insufficient documentation

## 2023-09-20 DIAGNOSIS — I5032 Chronic diastolic (congestive) heart failure: Secondary | ICD-10-CM | POA: Insufficient documentation

## 2023-09-20 DIAGNOSIS — Z79899 Other long term (current) drug therapy: Secondary | ICD-10-CM | POA: Insufficient documentation

## 2023-09-20 DIAGNOSIS — I2510 Atherosclerotic heart disease of native coronary artery without angina pectoris: Secondary | ICD-10-CM | POA: Insufficient documentation

## 2023-09-20 DIAGNOSIS — J9610 Chronic respiratory failure, unspecified whether with hypoxia or hypercapnia: Secondary | ICD-10-CM | POA: Diagnosis not present

## 2023-09-20 LAB — COMPREHENSIVE METABOLIC PANEL WITH GFR
ALT: 13 U/L (ref 0–44)
AST: 20 U/L (ref 15–41)
Albumin: 3.7 g/dL (ref 3.5–5.0)
Alkaline Phosphatase: 68 U/L (ref 38–126)
Anion gap: 13 (ref 5–15)
BUN: 49 mg/dL — ABNORMAL HIGH (ref 8–23)
CO2: 28 mmol/L (ref 22–32)
Calcium: 9.4 mg/dL (ref 8.9–10.3)
Chloride: 99 mmol/L (ref 98–111)
Creatinine, Ser: 1.75 mg/dL — ABNORMAL HIGH (ref 0.61–1.24)
GFR, Estimated: 42 mL/min — ABNORMAL LOW (ref >60)
Glucose, Bld: 175 mg/dL — ABNORMAL HIGH (ref 70–99)
Potassium: 3.6 mmol/L (ref 3.5–5.1)
Sodium: 140 mmol/L (ref 135–145)
Total Bilirubin: 0.8 mg/dL (ref 0.0–1.2)
Total Protein: 6.9 g/dL (ref 6.5–8.1)

## 2023-09-20 LAB — BRAIN NATRIURETIC PEPTIDE: B Natriuretic Peptide: 45.2 pg/mL (ref 0.0–100.0)

## 2023-09-20 MED ORDER — TORSEMIDE 20 MG PO TABS: 40.0000 mg | ORAL_TABLET | Freq: Every day | ORAL | 3 refills | Status: DC

## 2023-09-20 MED ORDER — TORSEMIDE 60 MG PO TABS: 40.0000 mg | ORAL_TABLET | Freq: Every day | ORAL | Status: DC

## 2023-09-20 NOTE — Addendum Note (Signed)
 Encounter addended by: Arleene Belt, PA-C on: 09/20/2023 1:52 PM  Actions taken: Clinical Note Signed

## 2023-09-20 NOTE — Progress Notes (Signed)
 ReDS Vest / Clip - 09/20/23 0910       ReDS Vest / Clip   Station Marker D    Ruler Value 34    ReDS Value Range Low volume    ReDS Actual Value 30

## 2023-09-20 NOTE — Patient Instructions (Signed)
 Medication Changes:  DECREASE TORSEMIDE  TO 40MG  ONCE DAILY   Lab Work:  Labs done today, your results will be available in MyChart, we will contact you for abnormal readings.  Referrals:  YOU HAVE BEEN REFERRED TO [facility] (DR. Annabelle Barrack)  THEY WILL REACH OUT TO YOU OR CALL TO ARRANGE THIS. PLEASE CALL US  WITH ANY CONCERNS   Follow-Up in: PLEASE KEEP FOLLOW UP WITH DR. CHANDRASHEKAR TOMORROW AT 4PM AT [address]   FOLLOW UP WITH US  AS NEEDED   At the [facility], you and your health needs are our priority. We have a designated team specialized in the treatment of Heart Failure. This Care Team includes your primary Heart Failure Specialized Cardiologist (physician), Advanced Practice Providers (APPs- Physician Assistants and Nurse Practitioners), and Pharmacist who all work together to provide you with the care you need, when you need it.   You may see any of the following providers on your designated Care Team at your next follow up:  Dr. Jules Oar Dr. Peder Bourdon Dr. Alwin Baars Dr. Judyth Nunnery Nieves Bars, NP Ruddy Corral, [place] [facility] [place], [place] Dennise Fitz, NP [place] Lee, NP Luster Salters, PharmD   Please be sure to bring in all your medications bottles to every appointment.   Need to Contact Us :  If you have any questions or concerns before your next appointment please send us  a message through [place] or call our office at [phone].    TO LEAVE A MESSAGE FOR THE NURSE SELECT OPTION 2, PLEASE LEAVE A MESSAGE INCLUDING: YOUR NAME DATE OF BIRTH CALL BACK NUMBER REASON FOR CALL**this is important as we prioritize the call backs  YOU WILL RECEIVE A CALL BACK THE SAME DAY AS LONG AS YOU CALL BEFORE 4:00 PM

## 2023-09-20 NOTE — Progress Notes (Addendum)
 HEART & VASCULAR TRANSITION OF CARE CONSULT NOTE     Referring Physician: Dr. Micael Adas PCP: Vevelyn Gowers, NP  Cardiologist: Dr. Alda Amas  Chief Complaint: HFpEF  HPI: Referred to clinic by Dr. Micael Adas with [facility] for heart failure consultation. 68 y.o. male with history hypertrophic cardiomyopathy, chronic diastolic CHF, HTN, PVCs s/p ablation in 03/23, HLD, DM II, OSA on CPAP, asthma, chronic respiratory failure on O2 with exertion, prostate cancer, tobacco use. He is followed by Dr. Alda Amas with [facility].  cMRI 02/22: severe asymmetric septal hypertrophy (20 mm septal wall), patchy LGE in basal septum and RV insertion sites c/w HOCM  Saw Dr. Alda Amas in 02/25 and was volume overloaded. Started on PO lasix . Stress PET was also ordered for evaluation of chest pain.   Echo 03/25: LVEF > 75%, moderate LVH involving basal-septal segment, peak LVOT gradient 23 mmHg, grade I DD, RV okay  He was admitted with acute on chronic CHF in 04/25. He was followed by Cardiology. Diuresed with IV lasix  then switched to po Torsemide  60 mg BID (had been on po lasix  40 daily). Also treated for left lower extremity cellulitis.   He is here today for CHF hospital follow-up. Reports his lower extremity edema has improved tremendously. Wearing compression stockings regularly. Still gets some edema, L >R, if he doesn't wear his stockings. His PCP has referred him to lymphedema clinic, Continues to get short of breath with little exertion, has trouble discerning if d/t asthma or CHF at times. Frequently needs to use prednisone  for asthma flares. Cannot lie flat d/t back pain. No chest pain. Weight shortly after discharge was 239 lb > 229 lb at home today. The last few days he's had dizziness with position changes and when attempting to walk. Has felt like he was going to pass out. No loss of consciousness or falls. Systolic blood pressure low 100s during one episode.   Continues to smoke  cigarettes, has cut back to 6/day. Using nicotine  patches and gum. No ETOH use.    Past Medical History:  Diagnosis Date   Allergy     Dust, mold, dust mites   Anemia    Anxiety    Asthma    Cancer (HCC)    prostate   Cataract    bilateral repair.   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     albuterol  (PROVENTIL ) (2.5 MG/3ML) 0.083% nebulizer solution TAKE 3 MLS BY NEBULIZATION EVERY 4 HOURS AS NEEDED FOR WHEEZING OR SHORTNESS OF BREATH (((PLAN B))) (Patient taking differently: Take 3 mLs by nebulization every 4 (four) hours as needed for wheezing or shortness of breath.) 375 mL 6   albuterol  (VENTOLIN  HFA) 108 (90 Base) MCG/ACT inhaler Inhale 2 puffs into the lungs every 4 hours as needed for shortness of breath or wheezing 18 g 5   ASPIRIN  81 PO Take 81 mg by mouth daily.     Budeson-Glycopyrrol-Formoterol  (BREZTRI  AEROSPHERE) 160-9-4.8 MCG/ACT AERO  Inhale 2 puffs into the lungs in the morning and at bedtime. 3 each 3   busPIRone  (BUSPAR ) 10 MG tablet Take 1 tablet (10 mg total) by mouth as needed (anxiety).     calcitonin, salmon, (MIACALCIN /FORTICAL) 200 UNIT/ACT nasal spray Place 1 spray into alternate nostrils daily.     Cholecalciferol  (VITAMIN D PO) Take 1 tablet by mouth daily.     cyclobenzaprine  (FLEXERIL ) 10 MG tablet Take 1 tablet (10 mg total) by mouth 3 (three) times daily as needed for muscle spasms.     dextromethorphan -guaiFENesin  (MUCINEX  DM) 30-600 MG 12hr tablet Take 1 tablet by mouth 2 (two)  times daily. (Patient taking differently: Take 1 tablet by mouth as needed for cough.) 60 tablet 0   Dupilumab  (DUPIXENT ) 300 MG/2ML SOAJ Inject 300 mg into the skin every 14 (fourteen) days. **loading dose completed in clinic on 12/15/22** 12 mL 1   empagliflozin  (JARDIANCE ) 10 MG TABS tablet Take 1 tablet (10 mg total) by mouth daily. 30 tablet 0   ezetimibe  (ZETIA ) 10 MG tablet Take 1 tablet (10 mg total) by mouth every evening.     famotidine  (PEPCID ) 20 MG tablet Take 20-40 mg by mouth daily as needed for heartburn or indigestion.     gabapentin  (NEURONTIN ) 300 MG capsule Take 1 capsule (300 mg total) by mouth daily as needed (Nerve pain).     glucose blood (TRUE METRIX BLOOD GLUCOSE TEST) test strip Use as instructed 100 each 12   ipratropium (ATROVENT ) 0.06 % nasal spray Place 2 sprays into both nostrils 3 (three) times daily. As needed for nasal congestion, runny nose 15 mL 11   ipratropium-albuterol  (DUONEB) 0.5-2.5 (3) MG/3ML SOLN INHALE 1 VIAL VIA NEBULIZER TWICE A DAY 90 mL 3   LINZESS  145 MCG CAPS capsule Take 145 mcg by mouth daily.     MAGnesium -Oxide 400 (240 Mg) MG tablet Take 1 tablet (400 mg total) by mouth daily. 90 tablet 3   metFORMIN  (GLUCOPHAGE -XR) 500 MG 24 hr tablet Take 2 tablets (1,000 mg total) by mouth in the morning AND 1 tablet (500 mg total) every evening with meals. 270 tablet 0   metoprolol  succinate (TOPROL -XL) 100 MG 24 hr tablet Take 2 tablets (200 mg total) by mouth daily. Take with or immediately following a meal. 60 tablet 0   montelukast  (SINGULAIR ) 10 MG tablet Take 1 tablet (10 mg total) by mouth at bedtime as needed (allergies).     nicotine  (NICODERM CQ  - DOSED IN MG/24 HR) 7 mg/24hr patch Place 1 patch (7 mg total) onto the skin daily. 28 patch 0   omeprazole (PRILOSEC) 40 MG capsule Take 40 mg by mouth as needed.     OXYGEN Inhale 3 L into the lungs See admin instructions. Inhale 3L when needed unless overexertion, then use continuously.     potassium  chloride SA (KLOR-CON  M) 20 MEQ tablet Take 2 tablets (40 mEq total) by mouth daily. 60 tablet 0   predniSONE  (DELTASONE ) 10 MG tablet Take 30 mg by mouth daily as needed.     rosuvastatin  (CRESTOR ) 20 MG tablet Take 1 tablet (20 mg total) by mouth daily. 90 tablet 3   silver  sulfADIAZINE  (SILVADENE ) 1 % cream Apply 1 Application topically as needed (rash).     torsemide  (DEMADEX ) 20 MG tablet Take 2 tablets (40 mg total) by mouth daily. 60 tablet 3   traMADol  (ULTRAM ) 50 MG tablet Take 1 tablet (50 mg total) by mouth every evening. Each prescription to last  1 mth (Patient taking differently: Take 50 mg by mouth every evening.) 30 tablet 2   TRUEplus Lancets 28G MISC Use as directed 100 each 4   valsartan  (DIOVAN ) 320 MG tablet Take 1 tablet (320 mg total) by mouth daily. 90 tablet 3   No current facility-administered medications for this encounter.    Allergies  Allergen Reactions   Lisinopril Shortness Of Breath   Molds & Smuts Anaphylaxis   Dust Mite Extract       Social History   Socioeconomic History   Marital status: Single    Spouse name: Not on file   Number of children: 2   Years of education: 12 grade   Highest education level: Not on file  Occupational History   Occupation: Facilities manager: [employer]  Tobacco Use   Smoking status: Every Day    Current packs/day: 0.75    Average packs/day: 0.7 packs/day for 49.3 years (37.0 ttl pk-yrs)    Types: Cigarettes    Start date: 1976   Smokeless tobacco: Never   Tobacco comments:    still smoking 0.5 ppd  Vaping Use   Vaping status: Never Used  Substance and Sexual Activity   Alcohol use: Not Currently    Alcohol/week: 3.0 standard drinks of alcohol    Types: 3 Cans of beer per week   Drug use: No   Sexual activity: Not on file  Other Topics Concern   Not on file  Social History Narrative   Not on file   Social Drivers of Health   Financial Resource Strain: Not on file  Food Insecurity: No Food  Insecurity (09/07/2023)   Hunger Vital Sign    Worried About Running Out of Food in the Last Year: Never true    Ran Out of Food in the Last Year: Never true  Transportation Needs: No Transportation Needs (09/07/2023)   PRAPARE - Administrator, Civil Service (Medical): No    Lack of Transportation (Non-Medical): No  Physical Activity: Not on file  Stress: No Stress Concern Present (05/26/2020)   [employer] of Occupational Health - Occupational Stress Questionnaire    Feeling of Stress : Only a little  Social Connections: Unknown (09/07/2023)   Social Connection and Isolation Panel [NHANES]    Frequency of Communication with Friends and Family: Three times a week    Frequency of Social Gatherings with Friends and Family: Twice a week    Attends Religious Services: Patient declined    Database administrator or Organizations: No    Attends Banker Meetings: Never    Marital Status: Never married  Intimate Partner Violence: Not At Risk (09/07/2023)   Humiliation, Afraid, Rape, and Kick questionnaire    Fear of Current or Ex-Partner: No    Emotionally Abused: No    Physically Abused: No    Sexually Abused: No      Family History  Problem Relation Age of Onset   Colon cancer Father    Lung cancer Father        was a smoker   Prostate cancer Father    Lung cancer Mother        was a smoker   Asthma Mother    Allergies Brother    Diabetes Maternal Grandmother    Colon polyps Neg Hx    Esophageal cancer Neg Hx    Stomach cancer Neg Hx    Rectal cancer Neg Hx  Vitals:   09/20/23 0910  BP: 111/67  Pulse: (!) 122  SpO2: 96%  Weight: 109.4 kg (241 lb 3.2 oz)  Height: 5\' 9"  (1.753 m)   BP lying 122/70 HR 111 BP sitting 110/70 HR 110 BP standing 103/53 HR 101   PHYSICAL EXAM: General:  Well appearing.  Neck: no JVD.  Cor: Regular rate & rhythm, tachy. No rubs, gallops or murmurs. Lungs: clear Abdomen: obese, soft, nontender, nondistended.   Extremities: compression stockings on, trace edema on right, 1+ edema on left w/ dressing over wound on anterior shin Neuro: alert & oriented x 3. Affect anxious.   ECG: Sinus tach 118 bpm   ASSESSMENT & PLAN: HFpEF Hypertrophic cardiomyopathy -cMRI 02/22: severe asymmetric septal hypertrophy (20 mm septal wall), patchy LGE in basal septum and RV insertion sites c/w HOCM -Echo 03/25: LVEF > 75%, moderate LVH involving basal-septal segment, peak LVOT gradient 23 mmHg, grade I DD, RV okay -LVOT gradient has been variable on prior studies -NYHA IIIb  -Volume looks okay on exam and by ReDS which is 30%. Now with worsening orthostatic dizziness and near syncope. Concern for overdiuresis, worsening LVOT obstruction? Discussed with his primary Cardiologist, Dr. Alda Amas. Will refer to Dr. Paulita Boss with HCM clinic. Will likely need repeat echo to reassess degree of LVOT obstruction. Cut back Torsemide  to 40 mg daily. If symptoms not improving, may need to reduce his valsartan . -Continue metoprolol  xl 200 mg daily -Continue Jardiance  10 mg daily (Not sure if he will tolerate long-term d/t HCM) -Discussed screening in first degree relatives as well as genetic testing -check labs today  Sinus tachycardia -Despite metoprolol  xl 200 mg daily -? How much volume depletion in setting of HOCM contributing -Cutting back diuretics  Chest pain Coronary atherosclerosis -Calcium  score of 117 (81st percentile for age and gender) in 12/21 -Currently denies chest pain. Has stress PET scheduled for later this month to evaluate chest discomfort/dyspnea. -On aspirin  + statin  PVCs -S/p ablation in 03/23  Tobacco use -Has cut back, using nicotine  patches and gum -Discussed complete smoking cessation  OSA -Uses CPAP  HTN Orthostatic hypotension -BP controlled. Positive orthostatics in clinic today. Diuretics reduced. -May need to scale back other meds as above   Referred to HFSW (PCP,  Medications, Transportation, ETOH Abuse, Drug Abuse, Insurance, Financial ): No Refer to Pharmacy: No Refer to Home Health: No Refer to Advanced Heart Failure Clinic: No Refer to General Cardiology: No, already established and has f/u  Follow up  PRN, keep follow-up with Cardiology, referred to HCM clinic.

## 2023-09-21 ENCOUNTER — Encounter: Payer: Self-pay | Admitting: Internal Medicine

## 2023-09-21 ENCOUNTER — Ambulatory Visit: Attending: Internal Medicine | Admitting: Internal Medicine

## 2023-09-21 VITALS — BP 116/80 | HR 114 | Ht 69.0 in | Wt 238.8 lb

## 2023-09-21 DIAGNOSIS — I10 Essential (primary) hypertension: Secondary | ICD-10-CM | POA: Diagnosis not present

## 2023-09-21 DIAGNOSIS — I5033 Acute on chronic diastolic (congestive) heart failure: Secondary | ICD-10-CM

## 2023-09-21 DIAGNOSIS — R000 Tachycardia, unspecified: Secondary | ICD-10-CM | POA: Diagnosis not present

## 2023-09-21 DIAGNOSIS — I422 Other hypertrophic cardiomyopathy: Secondary | ICD-10-CM

## 2023-09-21 DIAGNOSIS — R079 Chest pain, unspecified: Secondary | ICD-10-CM

## 2023-09-21 DIAGNOSIS — I5032 Chronic diastolic (congestive) heart failure: Secondary | ICD-10-CM

## 2023-09-21 DIAGNOSIS — I951 Orthostatic hypotension: Secondary | ICD-10-CM

## 2023-09-21 DIAGNOSIS — I493 Ventricular premature depolarization: Secondary | ICD-10-CM

## 2023-09-21 DIAGNOSIS — Z8241 Family history of sudden cardiac death: Secondary | ICD-10-CM

## 2023-09-21 MED ORDER — VALSARTAN 160 MG PO TABS: 160.0000 mg | ORAL_TABLET | Freq: Every day | ORAL | 3 refills | Status: AC

## 2023-09-21 NOTE — Progress Notes (Signed)
 Cardiology Office Note:  .    Date:  09/21/2023  ID:  Joseph Harvey, DOB [birth year], MRN [id number] PCP: Vevelyn Gowers, NP  [facility] Providers Cardiologist:  Wendie Hamburg, MD Electrophysiologist:  Will Cortland Ding, MD     CC: Get Better Consulted for the evaluation of oHCM at the behest of Dr. Alda Amas   History of Present Illness: .    Joseph Harvey is a 68 y.o. male  with hypertrophic cardiomyopathy who presents for complex management of his condition and tobacco abuse.  He has a history of hypertrophic cardiomyopathy diagnosed around 2021-2022, with a septal thickness of 2.2 cm measured earlier this year. He experiences left ventricular outflow tract (LVOT) flow acceleration without a significant gradient at rest, and with provocation, his LVOT gradient was 23, though the study was technically difficult. He has frequent premature ventricular contractions (PVCs), status post ablation, and heart failure with preserved ejection fraction. He feels lightheaded and dizzy, particularly when standing, and has been reducing his medication due to these symptoms. He experiences leg swelling, which he manages with compression socks and medication, though taking the full dose exacerbates his lightheadedness and dizziness.  He has coronary artery calcifications and aortic atherosclerosis. His creatinine levels are labile, with a recent measurement of 1.7 indicating acute kidney injury. He has been reducing torsemide  due to these issues. He experiences shortness of breath and difficulty breathing, particularly when walking or carrying heavy items.  He has a family history of heart problems, including a sister who passed away from heart issues and a cousin who recently underwent open heart surgery. He has two daughters and five grandchildren.  He is an active smoker, having started smoking at around 68 years old, and currently smokes six to ten cigarettes a day. He acknowledges the  impact of smoking on his health, particularly on his right ventricular dilation and breathing issues.  He is currently taking valsartan , which he has been advised to cut in half due to low blood pressure issues. He also takes metformin  for type 2 diabetes and manages his medications to avoid side effects. His blood pressure fluctuates, often dropping when he stands up, leading to dizziness and lightheadedness.  Relevant histories: .  Social  - family history of SCD ROS: As per HPI.   Studies Reviewed: .     Cardiac Studies & Procedures   ______________________________________________________________________________________________   STRESS TESTS  MYOCARDIAL PERFUSION IMAGING 04/29/2020  Narrative  The left ventricular ejection fraction is normal (55-65%).  Nuclear stress EF: 57%.  There was no ST segment deviation noted during stress.  Negative stress test  The study is normal.  This is a low risk study.   ECHOCARDIOGRAM  ECHOCARDIOGRAM COMPLETE 08/04/2023  Narrative ECHOCARDIOGRAM REPORT    Patient Name:   Joseph Harvey  Date of Exam: 08/04/2023 Medical Rec #:  [id number]     Height:       70.0 in Accession #:    [id number]    Weight:       235.0 lb Date of Birth:  26-Oct-1955     BSA:          2.235 m Patient Age:    67 years      BP:           120/60 mmHg Patient Gender: M             HR:           105 bpm. Exam Location:  [facility]  Procedure: 2D Echo, Cardiac Doppler, Color Doppler and Intracardiac Opacification Agent (Both Spectral and Color Flow Doppler were utilized during procedure).  Indications:    I42.1 HOCM  History:        Patient has prior history of Echocardiogram examinations, most recent 09/05/2022. Risk Factors:Hypertension, Diabetes, Dyslipidemia and Current Smoker.  Sonographer:    Cheri Coria Rodgers-Jones RDCS Referring Phys: [id number] [name] L SCHUMANN  IMPRESSIONS   1. Peak LVOT velocity 2.4 m/s, PG 23 mmHg. Left ventricular  ejection fraction, by estimation, is >75%. The left ventricle has hyperdynamic function. The left ventricle has no regional wall motion abnormalities. There is moderate left ventricular hypertrophy of the basal-septal segment. Left ventricular diastolic parameters are consistent with Grade I diastolic dysfunction (impaired relaxation). 2. Right ventricular systolic function is normal. The right ventricular size is normal. 3. The mitral valve is normal in structure. No evidence of mitral valve regurgitation. No evidence of mitral stenosis. 4. The aortic valve is normal in structure. Aortic valve regurgitation is not visualized. No aortic stenosis is present. 5. The inferior vena cava is normal in size with greater than 50% respiratory variability, suggesting right atrial pressure of 3 mmHg.  Conclusion(s)/Recommendation(s): Findings consistent with hypertrophic cardiomyopathy.  FINDINGS Left Ventricle: Peak LVOT velocity 2.4 m/s, PG 23 mmHg. Left ventricular ejection fraction, by estimation, is >75%. The left ventricle has hyperdynamic function. The left ventricle has no regional wall motion abnormalities. Definity  contrast agent was given IV to delineate the left ventricular endocardial borders. The left ventricular internal cavity size was normal in size. There is moderate left ventricular hypertrophy of the basal-septal segment. Left ventricular diastolic parameters are consistent with Grade I diastolic dysfunction (impaired relaxation).  Right Ventricle: The right ventricular size is normal. No increase in right ventricular wall thickness. Right ventricular systolic function is normal.  Left Atrium: Left atrial size was normal in size.  Right Atrium: Right atrial size was normal in size.  Pericardium: There is no evidence of pericardial effusion.  Mitral Valve: The mitral valve is normal in structure. No evidence of mitral valve regurgitation. No evidence of mitral valve  stenosis.  Tricuspid Valve: The tricuspid valve is normal in structure. Tricuspid valve regurgitation is not demonstrated. No evidence of tricuspid stenosis.  Aortic Valve: The aortic valve is normal in structure. Aortic valve regurgitation is not visualized. No aortic stenosis is present.  Pulmonic Valve: The pulmonic valve was normal in structure. Pulmonic valve regurgitation is not visualized. No evidence of pulmonic stenosis.  Aorta: The aortic root is normal in size and structure.  Venous: The inferior vena cava is normal in size with greater than 50% respiratory variability, suggesting right atrial pressure of 3 mmHg.  IAS/Shunts: No atrial level shunt detected by color flow Doppler.   LEFT VENTRICLE PLAX 2D LVIDd:         5.00 cm   Diastology LV PW:         0.80 cm   LV e' medial:    6.75 cm/s LV IVS:        1.50 cm   LV E/e' medial:  9.0 LVOT diam:     2.10 cm   LV e' lateral:   9.56 cm/s LV SV:         73        LV E/e' lateral: 6.4 LV SV Index:   33 LVOT Area:     3.46 cm   RIGHT VENTRICLE  IVC RV Basal diam:  4.40 cm     IVC diam: 1.00 cm RV S prime:     13.37 cm/s TAPSE (M-mode): 2.8 cm  LEFT ATRIUM             Index        RIGHT ATRIUM           Index LA Vol (A2C):   34.2 ml 15.30 ml/m  RA Area:     14.40 cm LA Vol (A4C):   47.1 ml 21.07 ml/m  RA Volume:   40.30 ml  18.03 ml/m LA Biplane Vol: 42.1 ml 18.83 ml/m AORTIC VALVE LVOT Vmax:   121.33 cm/s LVOT Vmean:  81.500 cm/s LVOT VTI:    0.211 m  AORTA Ao Root diam: 3.50 cm Ao Asc diam:  3.20 cm  MITRAL VALVE MV Area (PHT): 5.01 cm     SHUNTS MV Decel Time: 152 msec     Systemic VTI:  0.21 m MV E velocity: 60.80 cm/s   Systemic Diam: 2.10 cm MV A velocity: 100.00 cm/s MV E/A ratio:  0.61  Dorothye Gathers MD Electronically signed by Dorothye Gathers MD Signature Date/Time: 08/04/2023/3:15:53 PM    Final    MONITORS  LONG TERM MONITOR-LIVE TELEMETRY (3-14 DAYS) 04/18/2023  Narrative    Frequent PVCs (7.7% of beats)   7 episodes of NSVT, longest lasting 12 beats   4 episodes of SVT, longest lasting 18 beats   Patch Wear Time:  14 days and 14 hours (2024-11-01T02:49:03-399 to 2024-11-20T02:16:45-0500)  Monitor 1-1 day 6 hours Patient had a min HR of 84 bpm, max HR of 130 bpm, and avg HR of 102 bpm. Predominant underlying rhythm was Sinus Rhythm. Isolated SVEs were rare (<1.0%), and no SVE Couplets or SVE Triplets were present. Isolated VEs were occasional (2.6%, 3058), VE Couplets were rare (<1.0%, 61), and no VE Triplets were present. Ventricular Bigeminy and Trigeminy were present.  Monitor 2-13 days 8 hours Patient had a min HR of 71 bpm, max HR of 200 bpm, and avg HR of 99 bpm. Predominant underlying rhythm was Sinus Rhythm. 7 Ventricular Tachycardia runs occurred, the run with the fastest interval lasting 4 beats with a max rate of 176 bpm, the longest lasting 12 beats with an avg rate of 103 bpm. 4 Supraventricular Tachycardia runs occurred, the run with the fastest interval lasting 5 beats with a max rate of 200 bpm, the longest lasting 18 beats with an avg rate of 159 bpm. Isolated SVEs were rare (<1.0%), SVE Couplets were rare (<1.0%), and SVE Triplets were rare (<1.0%). Isolated VEs were frequent (7.7%, [id number]), VE Couplets were rare (<1.0%, 2351), and VE Triplets were rare (<1.0%, 345). Ventricular Bigeminy and Trigeminy were present.   CT SCANS  CT CARDIAC SCORING (SELF PAY ONLY) 04/29/2020  Addendum 05/03/2020 10:16 PM ADDENDUM REPORT: 05/03/2020 22:14  CLINICAL DATA:  Risk stratification  EXAM: Coronary Calcium  Score  TECHNIQUE: The patient was scanned on a [employer] scanner. Axial non-contrast 3 mm slices were carried out through the heart. The data set was analyzed on a dedicated work station and scored using the Agatson method.  FINDINGS: Non-cardiac: See separate report from [facility].  Ascending Aorta: Normal  Pericardium:  Normal  Coronary arteries: Calcium  score 117  IMPRESSION: Coronary calcium  score of 117. This was 81st percentile for age and sex matched control.   Electronically Signed By: Carson Clara MD On: 05/03/2020 22:14  Narrative EXAM: OVER-READ INTERPRETATION  CT CHEST  The following report is an over-read performed by radiologist Dr. Erica Hau of [facility] on 04/29/2020. This over-read does not include interpretation of cardiac or coronary anatomy or pathology. The coronary calcium  score interpretation by the cardiologist is attached.  COMPARISON:  CTA of the chest on 07/24/2014  FINDINGS: Vascular: No significant vascular findings. Normal heart size. No pericardial effusion.  Mediastinum/Nodes: Visualized mediastinum and hilar regions demonstrate no lymphadenopathy or masses.  Lungs/Pleura: Visualized lungs show no evidence of pulmonary edema, consolidation, pneumothorax, nodule or pleural fluid.  Upper Abdomen: No acute abnormality.  Musculoskeletal: No chest wall mass or suspicious bone lesions identified.  IMPRESSION: No significant incidental findings.  Electronically Signed: By: Erica Hau M.D. On: 04/29/2020 09:28   CARDIAC MRI  MR CARDIAC MORPHOLOGY W WO CONTRAST 06/22/2020  Narrative CLINICAL DATA:  45M evaluate for HCM  EXAM: CARDIAC MRI  TECHNIQUE: The patient was scanned on a 1.5 Tesla Siemens magnet. A dedicated cardiac coil was used. Functional imaging was done using Fiesta sequences. 2,3, and 4 chamber views were done to assess for RWMA's. Modified Simpson's rule using a short axis stack was used to calculate an ejection fraction on a dedicated work [employer]. The patient received 10 cc of Gadavist . After 10 minutes inversion recovery sequences were used to assess for infiltration and scar tissue.  CONTRAST:  10 cc  of Gadavist   FINDINGS: Left ventricle:  -Asymmetric hypertrophy  measuring up to 20mm in basal anteroseptum (8mm in posterior wall)  -Normal size  -Normal systolic function  -Mild ECV elevation (29%)  -Patchy LGE in basal septum and RV insertion sites. LGE accounts for 11% of total myocardial mass  LV EF: 63% (Normal 56-78%)  Absolute volumes:  LV EDV: (Normal 77-195 mL)  LV ESV: 43mL (Normal 19-72 mL)  LV SV: 73mL (Normal 51-133 mL)  CO: 6.2L/min (Normal 2.8-8.8 L/min)  Indexed volumes:  LV EDV: 18mL/sq-m (Normal 47-92 mL/sq-m)  LV ESV: 13mL/sq-m (Normal 13-30 mL/sq-m)  LV SV: 54mL/sq-m (Normal 32-62 mL/sq-m)  CI: 2.8L/min/sq-m (Normal 1.7-4.2 L/min/sq-m)  Right ventricle: Normal size and systolic function  RV EF:  63% (Normal 47-74%)  Absolute volumes:  RV EDV: (Normal 88-227 mL)  RV ESV: 50mL (Normal 23-103 mL)  RV SV: 82mL (Normal 52-138 mL)  CO: 7.0L/min (Normal 2.8-8.8 L/min)  Indexed volumes:  RV EDV: 67mL/sq-m (Normal 55-105 mL/sq-m)  RV ESV: 32mL/sq-m (Normal 15-43 mL/sq-m)  RV SV: 35mL/sq-m (Normal 32-64 mL/sq-m)  CI: 3.1L/min/sq-m (Normal 1.7-4.2 L/min/sq-m)  Left atrium: Mild enlargement  Right atrium: Normal size  Mitral valve: No regurgitation  Aortic valve: No regurgitation  Tricuspid valve: No regurgitation  Pulmonic valve: No regurgitation  Aorta: Normal proximal ascending aorta  Pericardium: Normal  IMPRESSION: 1. Asymmetric hypertrophy measuring up to 20mm in basal anteroseptum (8mm in posterior wall), consistent with hypertrophic cardiomyopathy  2. Patchy LGE in basal septum and RV insertion sites, consistent with HCM. LGE accounts for 11% of total myocardial mass  3. Normal LV size and systolic function (EF 63%)  4. Normal RV size and systolic function (EF 63%)   Electronically Signed By: Carson Clara MD On: 06/23/2020 22:42   ______________________________________________________________________________________________      Physical Exam:     VS:  BP 116/80 (BP Location: Right Arm)   Pulse (!) 114   Ht 5\' 9"  (1.753 m)   Wt 238 lb 12.8 oz (108.3 kg)   SpO2 95%   BMI 35.26 kg/m    Wt Readings from  Last 3 Encounters:  09/21/23 238 lb 12.8 oz (108.3 kg)  09/20/23 241 lb 3.2 oz (109.4 kg)  09/14/23 245 lb 6 oz (111.3 kg)    Gen: no distress, morbid obesity   Neck: No JVD Cardiac: No Rubs or Gallops, systolic murmur, regular tachycardia, +2 radial pulses Respiratory: Clear to auscultation bilaterally, normal effort, normal  respiratory rate GI: Soft, nontender, distended with dullness to percussion  MS: trace bilateral pitting edema;  moves all extremities Integument: Skin feels warm, skin wounds are improving Neuro:  At time of evaluation, alert and oriented to person/place/time/situation  Psych: Normal affect, patient feels frustrated   ASSESSMENT AND PLAN: .    An EKG was ordered for tachycardia and shows sinus tachycardia  Hypertrophic cardiomyopathy Family History of SCD (sister, cousin, one additional family member) Nonobstructive hypertrophic cardiomyopathy with a history since 2021-2022. No significant LVOT gradient at rest, but with provocation, the gradient was 23. Technically difficult study with no clear envelope. Genetic testing discussed for familial implications, not expected to change current management. Potential for open heart surgery if obstruction is confirmed, but not currently indicated. Genetic testing primarily for familial risk assessment, particularly for his daughters and grandchildren. - Order limited echocardiogram to assess for inducible gradient (stress- treadmill can be converted if able if Valsalva gradient is significant) - Initiate genetic testing via [facility] for hypertrophic cardiomyopathy (see below)  Acute on chronic heart failure with preserved ejection fraction Chronic HFpEF with recent acute exacerbation. Fluid overload present. Symptoms include dizziness and orthostatic  hypotension. Recent creatinine fluctuations complicate management. Current management includes diuretics and blood pressure control. Plan to adjust medications to balance fluid management and kidney function. - Reduce valsartan  dose from 320 mg to 160 mg - Monitor kidney function with follow-up BMP in one week - Adjust diuretic therapy as needed based on kidney function and blood pressure - continue SGLT2i   Orthostatic hypotension Orthostatic hypotension contributing to dizziness and lightheadedness. Recent adjustments in diuretic therapy and blood pressure medications to address symptoms. - Monitor blood pressure closely with reduced valsartan  dose; will work to wean off of this; potentially with increasing torsemide   Acute kidney injury Recent acute kidney injury with labile creatinine levels. Management complicated by diuretic use and blood pressure control. - Monitor kidney function with follow-up BMP in one week - Adjust diuretic therapy as needed based on kidney function  Coronary artery calcifications Atypical chest pain Known coronary artery calcifications with a planned cardiac CT in February 2025. Ischemic testing pending to assess coronary artery disease. Potential for further evaluation based on test results. - concurrently follows with Dr. Alda Amas  Aortic atherosclerosis Aortic atherosclerosis noted on imaging. No immediate intervention planned, but aggressive management of cardiovascular risk factors is necessary. - Continue current therapy to control LDL levels  History of frequent PVCs, status post ablation - Frequent PVCs previously managed with ablation. Current management does not include ICD or defibrillator due to well-managed status post-ablation. No current indication for further intervention.  Will CC his EP  Chronic obstructive pulmonary disease (COPD) Suspected COPD based on smoking history and symptoms of shortness of breath. Right ventricular dilation noted,  likely related to smoking. Smoking cessation is critical for management. - Coordinate with Dr. Marygrace Snellen for pulmonary management - Encourage smoking cessation goal date for quit is 09/26/23  Tobacco use disorder Long-standing tobacco use disorder with smoking since adolescence. Smoking cessation is critical for overall health improvement. Set a quit date for next week. - Set quit date for next  week  Type 2 diabetes mellitus Morbid Obesity Type 2 diabetes mellitus managed with metformin . - agree with SGLT2i; this summer will hope to start GLP 1 RA this fall if does not have inducible gradient  BMP next week F/u with me after inducible gradient testing   Gloriann Larger, MD FASE [facility] Cardiologist [facility]  [address] [place], [place] [address] [phone]  5:54 PM  GENETIC TESTING COUNSELING EVALUATION:  I met with the patient today to discuss cardiac genetic testing. We reviewed the following information:  INDICATION FOR TESTING: * Confirmed diagnosis of HCM * Family history of SCD; has frequent PVCs and 11% scar burden  TESTING OPTION(S) DISCUSSED: * Buccal Swab HCM Panel [facility]  BENEFITS OF GENETIC TESTING DISCUSSED:  * May  refine clinical diagnosis * May provide prognostic information * May identify at-risk family members who could benefit from screening * May avoid unnecessary testing or interventions for family members who test negative   LIMITATIONS AND RISKS DISCUSSED:  * Testing may not identify a genetic cause, even if one exists * Testing may identify a variant of uncertain significance (VUS) * Results may change over time as more information becomes available * Potential for unexpected or secondary findings * Possible psychological impact of positive or negative results   FINANCIAL CONSIDERATIONS DISCUSSED:  * Insurance coverage (Hum[employer]/Medicaid) * Prior authorization requirements  LIFE INSURANCE/DISABILITY INSURANCE  IMPLICATIONS:  * GINA protection applies to health insurance and employment but not life, disability, or long-term care insurance * Recommendation to consider securing insurance prior to testing if concerned * Documentation considerations for insurance applications  FAMILY IMPLICATIONS: * Cascade testing approach for relatives if positive result * Communication strategies with family members (two daughters who would be amenable to this testing)  PATIENT UNDERSTANDING AND DECISION:  * Patient demonstrated understanding of the above information * Patient had opportunity to ask questions which were addressed * Patient decision: proceed with testing  FOLLOW-UP PLAN:  * Results disclosure plan: discuss at our upcoming follow up  Gloriann Larger, MD FASE [facility] Cardiologist [facility]  [facility]  [address] [place], [address] [place], [place] [address] [phone]  6:01 PM   Time Spent Directly with Patient:   I have spent a total of 54 minutes with the patient reviewing notes, imaging, EKGs, labs,  and examining the patient as well as establishing an assessment and plan that was discussed personally with the patient. Discussed disease state education , using cardiac modeling, and pre-evaluation genetic testing. Reviewed care and plan in collaboration with  Drs. Alda Amas, Hunsucker, and [employer].

## 2023-09-21 NOTE — Patient Instructions (Signed)
 Medication Instructions:  Your physician has recommended you make the following change in your medication:  DECREASE: valsartan  (Diovan ) to 160 mg by mouth once daily  *If you need a refill on your cardiac medications before your next appointment, please call your pharmacy*  Lab Work: NONE  If you have labs (blood work) drawn today and your tests are completely normal, you will receive your results only by: MyChart Message (if you have MyChart) OR A paper copy in the mail If you have any lab test that is abnormal or we need to change your treatment, we will call you to review the results.  Testing/Procedures: Your physician has requested that you complete Genetic Testing. You will receive a kit in the mail from [facility].   Your physician has requested that you have a stress echocardiogram. For further information please visit [email or web address] Please follow instruction sheet as given.   Please note: We ask at that you not bring children with you during ultrasound (echo/ vascular) testing. Due to room size and safety concerns, children are not allowed in the ultrasound rooms during exams. Our front office staff cannot provide observation of children in our lobby area while testing is being conducted. An adult accompanying a patient to their appointment will only be allowed in the ultrasound room at the discretion of the ultrasound technician under special circumstances. We apologize for any inconvenience.   Follow-Up: At [facility], you and your health needs are our priority.  As part of our continuing mission to provide you with exceptional heart care, our providers are all part of one team.  This team includes your primary Cardiologist (physician) and Advanced Practice Providers or APPs (Physician Assistants and Nurse Practitioners) who all work together to provide you with the care you need, when you need it.  Your next appointment:   6 week(s) after Echocardiogram    Provider:   Gloriann Larger, MD  Other Instructions You have decided to quit smoking next week.

## 2023-09-22 ENCOUNTER — Encounter (HOSPITAL_COMMUNITY): Payer: Self-pay

## 2023-09-22 ENCOUNTER — Ambulatory Visit: Payer: Self-pay | Admitting: Cardiology

## 2023-09-26 ENCOUNTER — Encounter (HOSPITAL_COMMUNITY): Payer: Self-pay

## 2023-09-26 ENCOUNTER — Ambulatory Visit (HOSPITAL_COMMUNITY)
Admission: RE | Admit: 2023-09-26 | Discharge: 2023-09-26 | Disposition: A | Discharge status: Home / Self Care | Payer: Medicare PPO | Source: Ambulatory Visit | Attending: Cardiology | Admitting: Cardiology

## 2023-09-26 DIAGNOSIS — R079 Chest pain, unspecified: Secondary | ICD-10-CM | POA: Insufficient documentation

## 2023-09-26 MED ORDER — REGADENOSON 0.4 MG/5ML IV SOLN
INTRAVENOUS | Status: AC
  Filled 2023-09-26: qty 5

## 2023-09-26 MED ORDER — REGADENOSON 0.4 MG/5ML IV SOLN: 0.4000 mg | Freq: Once | INTRAVENOUS | Status: DC

## 2023-09-26 NOTE — Progress Notes (Signed)
 Patient here today at [facility] for nuclear medicine cardiac stress test. Patient was brought into room and was on 3 liters nasal cannula, increased work of breathing, patient endorses increased  shortness of breath and anxiety at this time. Attached to cardiac monitor and shows Sinus Tach rates of 130. Patient states " my doctors have tried to schedule this test before when I was in the hospital but my heart rate was too high then". Patient laid flat in scanner and cannot tolerate lying flat at this time. Did not take fluid pill this morning per instructions. Patient does not wish to proceed with exam at this time and wishes to sit up. This RN agrees with not proceeding due to multiple factors. IV taken out. Patient ambulatory to lobby. This RN asked patient about going to ED for further evaluation and patient denies. Patient on home 02 and notable dyspnea. No active chest pain at this time.

## 2023-09-29 ENCOUNTER — Encounter (HOSPITAL_COMMUNITY): Admitting: Cardiology

## 2023-09-29 ENCOUNTER — Encounter (HOSPITAL_COMMUNITY)

## 2023-10-04 ENCOUNTER — Telehealth: Payer: Self-pay | Admitting: *Deleted

## 2023-10-04 NOTE — Telephone Encounter (Signed)
 Pt given instructions for stress echo.

## 2023-10-05 NOTE — Progress Notes (Deleted)
 Cardiology Clinic Note   Patient Name: Joseph Harvey Date of Encounter: 10/05/2023  Primary Care Provider:  Vevelyn Gowers, NP Primary Cardiologist:  Wendie Hamburg, MD  Patient Profile    Joseph Harvey 68 year old male presents to the clinic today for follow-up evaluation of his CHF.  Past Medical History    Past Medical History:  Diagnosis Date   Allergy     Dust, mold, dust mites   Anemia    Anxiety    Asthma    Cancer (HCC)    prostate   Cataract    bilateral repair.   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   Pneumonia    Stress incontinence    Past Surgical History:  Procedure Laterality Date   BACK SURGERY  2017   CATARACT EXTRACTION Bilateral    COLONOSCOPY     2013   COLONOSCOPY  10/03/2019   KNEE ARTHROSCOPY  2005   right   PROSTATE BIOPSY     PROSTATE SURGERY     PVC ABLATION N/A 08/10/2021   Procedure: PVC ABLATION;  Surgeon: Lei Pump, MD;  Location: [facility];  Service: Cardiovascular;  Laterality: N/A;   ROBOT ASSISTED LAPAROSCOPIC RADICAL PROSTATECTOMY  03/2010    Allergies  Allergies  Allergen Reactions   Lisinopril Shortness Of Breath   Molds & Smuts Anaphylaxis   Dust Mite Extract     History of Present Illness    Joseph Harvey has a PMH of hypertrophic cardiomyopathy PVCs, sinus tachycardia, orthostatic hypotension, HTN, acute on chronic CHF with preserved EF, and family history of sudden cardiac death.  His PMH also includes coronary artery calcification and aortic atherosclerosis.  He had a sister who passed away from heart issues and a cousin who underwent open heart surgery.  He is also an active smoker and has smoked for around 14 years  He was diagnosed with hypertrophic cardiomyopathy in 202  05-2020.  He was noted to have septal thickness of 2.2 cm.  He has left ventricular outflow tract acceleration without significant gradient at rest.  He has frequent PVCs and is status post ablation.  He was seen in follow-up by Dr. Paulita Boss on 09/21/2023.  During that time he reported feeling lightheaded and dizzy with standing.  His medications were in the process of being reduced.  He was managing his lower extremity edema with compression stockings.  His valsartan  had been cut in half due to low blood pressure issues.  He was also taking metformin  for type 2 diabetes.  He reported blood pressure fluctuations that would drop with standing.  His blood pressure was noted to be 116/80.  His EKG showed sinus tachycardia.  An echocardiogram was ordered to evaluate his HCM and genetic testing was initiated.  His valsartan  was reduced from 320 mg to 160 mg.  His SGLT2 inhibitor was continued.  It was felt that he may need to further reduce valsartan  and have his torsemide  increased.  He presents to the clinic today for follow-up evaluation and states***.  *** denies chest pain, shortness of breath, lower extremity edema, fatigue, palpitations, melena, hematuria, hemoptysis, diaphoresis, weakness, presyncope, syncope, orthopnea, and PND.  Orthostatic hypotension-BP today***.  Notes improvement in his dizziness with reduction in  valsartan . Change positions slowly Lower extremity support stockings Continue to monitor  Acute on chronic CHF-had recent hospitalization with acute exacerbation.  Previously unable to tolerate higher doses of valsartan .  Reports tolerating 160 mg well.  BMP shows***. Daily weights Heart healthy low-sodium diet Elevate lower extremities when not active Continue lower extremity support stockings Continue valsartan , Jardiance , metoprolol , torsemide , potassium  Coronary artery disease-denies recent anginal type symptoms.  Noted to have coronary artery calcifications on cardiac  CT 2/25.  With known result from cardiac PET High-fiber diet Continue metoprolol , ezetimibe , rosuvastatin   Hyperlipidemia, aortic atherosclerosis-LDL***. 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  Denies episodes of accelerated or irregular heartbeat.  Denies palpitations. Continue metoprolol  Follows with EP  HCM-following with Dr. Paulita Boss.  Previously did not have significant LVOT gradient at rest.  Seen in follow-up on 09/21/2023.  Limited echocardiogram to assess for inducible gradient ordered at that time. Continue current medical therapy Await results  Disposition: Follow-up with Dr. Alda Amas or me in 3-4 months andDr. Paulita Boss as scheduled.  Home Medications    Prior to Admission medications   Medication Sig Start Date End Date Taking? Authorizing Provider  acetaminophen  (TYLENOL ) 500 MG tablet Take 500-1,000 mg by mouth every 6 (six) hours as needed for mild pain (pain score 1-3) or moderate pain (pain score 4-6).    [provider]  albuterol  (PROVENTIL ) (2.5 MG/3ML) 0.083% nebulizer solution TAKE 3 MLS BY NEBULIZATION EVERY 4 HOURS AS NEEDED FOR WHEEZING OR SHORTNESS OF BREATH (((PLAN B))) Patient taking differently: Take 3 mLs by nebulization every 4 (four) hours as needed for wheezing or shortness of breath. 07/09/23   Hunsucker, Archer Kobs, MD  albuterol  (VENTOLIN  HFA) 108 (90 Base) MCG/ACT inhaler Inhale 2 puffs into the lungs every 4 hours as needed for shortness of breath or wheezing 01/11/23   Newlin, Enobong, MD  ASPIRIN  81 PO Take 81 mg by mouth daily.    [provider]  Budeson-Glycopyrrol-Formoterol  (BREZTRI  AEROSPHERE) 160-9-4.8 MCG/ACT AERO Inhale 2 puffs into the lungs in the morning and at bedtime. 02/20/23   Hunsucker, Archer Kobs, MD  busPIRone  (BUSPAR ) 10 MG tablet Take 1 tablet (10 mg total) by mouth as needed (anxiety). 09/14/23   Audria Leather, MD   calcitonin, salmon, (MIACALCIN Marciano Settles) 200 UNIT/ACT nasal spray Place 1 spray into alternate nostrils daily.    [provider]  Cholecalciferol  (VITAMIN D PO) Take 1 tablet by mouth daily.    [provider]  cyclobenzaprine  (FLEXERIL ) 10 MG tablet Take 1 tablet (10 mg total) by mouth 3 (three) times daily as needed for muscle spasms. 09/14/23   Audria Leather, MD  dextromethorphan -guaiFENesin  (MUCINEX  DM) 30-600 MG 12hr tablet Take 1 tablet by mouth 2 (two) times daily. Patient taking differently: Take 1 tablet by mouth as needed for cough. 06/24/23   Regalado, Belkys A, MD  Dupilumab  (DUPIXENT ) 300 MG/2ML SOAJ Inject 300 mg into the skin every 14 (fourteen) days. **loading dose completed in clinic on 12/15/22** 05/03/23   Hunsucker, Archer Kobs, MD  empagliflozin  (JARDIANCE ) 10 MG TABS tablet Take 1 tablet (10 mg total) by mouth daily. 09/15/23   Audria Leather, MD  ezetimibe  (ZETIA ) 10 MG tablet Take 1 tablet (10 mg total) by mouth every evening. 09/14/23   Audria Leather, MD  famotidine  (PEPCID ) 20 MG tablet Take 20-40 mg by mouth daily as needed for heartburn or indigestion. 05/14/23   [provider]  gabapentin  (NEURONTIN ) 300 MG capsule Take  1 capsule (300 mg total) by mouth daily as needed (Nerve pain). 09/14/23   Audria Leather, MD  glucose blood (TRUE METRIX BLOOD GLUCOSE TEST) test strip Use as instructed 01/07/19   Lawrance Presume, MD  ipratropium (ATROVENT ) 0.06 % nasal spray Place 2 sprays into both nostrils 3 (three) times daily. As needed for nasal congestion, runny nose 06/24/23   Regalado, Belkys A, MD  ipratropium-albuterol  (DUONEB) 0.5-2.5 (3) MG/3ML SOLN INHALE 1 VIAL VIA NEBULIZER TWICE A DAY 06/24/23   Regalado, Belkys A, MD  LINZESS  145 MCG CAPS capsule Take 145 mcg by mouth daily. 08/18/23   [provider]  MAGnesium -Oxide 400 (240 Mg) MG tablet Take 1 tablet (400 mg total) by mouth daily. 07/07/23   Wendie Hamburg, MD  metFORMIN   (GLUCOPHAGE -XR) 500 MG 24 hr tablet Take 2 tablets (1,000 mg total) by mouth in the morning AND 1 tablet (500 mg total) every evening with meals. 01/24/23   Lawrance Presume, MD  metoprolol  succinate (TOPROL -XL) 100 MG 24 hr tablet Take 2 tablets (200 mg total) by mouth daily. Take with or immediately following a meal. 09/14/23 10/14/23  Audria Leather, MD  montelukast  (SINGULAIR ) 10 MG tablet Take 1 tablet (10 mg total) by mouth at bedtime as needed (allergies). 09/14/23   Audria Leather, MD  nicotine  (NICODERM CQ  - DOSED IN MG/24 HR) 7 mg/24hr patch Place 1 patch (7 mg total) onto the skin daily. 06/24/23   Regalado, Belkys A, MD  omeprazole (PRILOSEC) 40 MG capsule Take 40 mg by mouth as needed. 07/21/23   [provider]  OXYGEN Inhale 3 L into the lungs See admin instructions. Inhale 3L when needed unless overexertion, then use continuously.    [provider]  potassium chloride  SA (KLOR-CON  M) 20 MEQ tablet Take 2 tablets (40 mEq total) by mouth daily. 09/14/23   Audria Leather, MD  predniSONE  (DELTASONE ) 10 MG tablet Take 30 mg by mouth daily as needed. 07/03/23   [provider]  rosuvastatin  (CRESTOR ) 20 MG tablet Take 1 tablet (20 mg total) by mouth daily. 02/02/22   Wendie Hamburg, MD  silver  sulfADIAZINE  (SILVADENE ) 1 % cream Apply 1 Application topically as needed (rash). 09/14/23   Audria Leather, MD  torsemide  (DEMADEX ) 20 MG tablet Take 2 tablets (40 mg total) by mouth daily. 09/20/23   Arleene Belt, PA-C  traMADol  (ULTRAM ) 50 MG tablet Take 1 tablet (50 mg total) by mouth every evening. Each prescription to last 1 mth Patient taking differently: Take 50 mg by mouth every evening. 09/28/22   Lawrance Presume, MD  TRUEplus Lancets 28G MISC Use as directed 01/07/19   Lawrance Presume, MD  valsartan  (DIOVAN ) 160 MG tablet Take 1 tablet (160 mg total) by mouth daily. 09/21/23   Jann Melody, MD    Family History    Family History  Problem  Relation Age of Onset   Colon cancer Father    Lung cancer Father        was a smoker   Prostate cancer Father    Lung cancer Mother        was a smoker   Asthma Mother    Allergies Brother    Diabetes Maternal Grandmother    Colon polyps Neg Hx    Esophageal cancer Neg Hx    Stomach cancer Neg Hx    Rectal cancer Neg Hx    He indicated that the status of his mother is unknown. He indicated  that his father is deceased. He indicated that the status of his brother is unknown. He indicated that the status of his maternal grandmother is unknown. He indicated that the status of his neg hx is unknown.  Social History    Social History   Socioeconomic History   Marital status: Single    Spouse name: Not on file   Number of children: 2   Years of education: 12 grade   Highest education level: Not on file  Occupational History   Occupation: Facilities manager: [employer]  Tobacco Use   Smoking status: Every Day    Current packs/day: 0.75    Average packs/day: 0.8 packs/day for 49.4 years (37.0 ttl pk-yrs)    Types: Cigarettes    Start date: 1976   Smokeless tobacco: Never   Tobacco comments:    still smoking 0.5 ppd  Vaping Use   Vaping status: Never Used  Substance and Sexual Activity   Alcohol use: Not Currently    Alcohol/week: 3.0 standard drinks of alcohol    Types: 3 Cans of beer per week   Drug use: No   Sexual activity: Not on file  Other Topics Concern   Not on file  Social History Narrative   Not on file   Social Drivers of Health   Financial Resource Strain: Not on file  Food Insecurity: No Food Insecurity (09/07/2023)   Hunger Vital Sign    Worried About Running Out of Food in the Last Year: Never true    Ran Out of Food in the Last Year: Never true  Transportation Needs: No Transportation Needs (09/07/2023)   PRAPARE - Administrator, Civil Service (Medical): No    Lack of Transportation (Non-Medical): No  Physical Activity: Not on file   Stress: No Stress Concern Present (05/26/2020)   [employer] of Occupational Health - Occupational Stress Questionnaire    Feeling of Stress : Only a little  Social Connections: Unknown (09/07/2023)   Social Connection and Isolation Panel [NHANES]    Frequency of Communication with Friends and Family: Three times a week    Frequency of Social Gatherings with Friends and Family: Twice a week    Attends Religious Services: Patient declined    Database administrator or Organizations: No    Attends Banker Meetings: Never    Marital Status: Never married  Intimate Partner Violence: Not At Risk (09/07/2023)   Humiliation, Afraid, Rape, and Kick questionnaire    Fear of Current or Ex-Partner: No    Emotionally Abused: No    Physically Abused: No    Sexually Abused: No     Review of Systems    General:  No chills, fever, night sweats or weight changes.  Cardiovascular:  No chest pain, dyspnea on exertion, edema, orthopnea, palpitations, paroxysmal nocturnal dyspnea. Dermatological: No rash, lesions/masses Respiratory: No cough, dyspnea Urologic: No hematuria, dysuria Abdominal:   No nausea, vomiting, diarrhea, bright red blood per rectum, melena, or hematemesis Neurologic:  No visual changes, wkns, changes in mental status. All other systems reviewed and are otherwise negative except as noted above.  Physical Exam    VS:  There were no vitals taken for this visit. , BMI There is no height or weight on file to calculate BMI. GEN: Well nourished, well developed, in no acute distress. HEENT: normal. Neck: Supple, no JVD, carotid bruits, or masses. Cardiac: RRR, no murmurs, rubs, or gallops. No clubbing, cyanosis, edema.  Radials/DP/PT 2+ and equal bilaterally.  Respiratory:  Respirations regular and unlabored, clear to auscultation bilaterally. GI: Soft, nontender, nondistended, BS + x 4. MS: no deformity or atrophy. Skin: warm and dry, no rash. Neuro:  Strength and  sensation are intact. Psych: Normal affect.  Accessory Clinical Findings    Recent Labs: 09/06/2023: Pro Brain Natriuretic Peptide 524.0 09/11/2023: Hemoglobin 11.3; Platelets 300 09/14/2023: Magnesium  2.4 09/20/2023: ALT 13; B Natriuretic Peptide 45.2; BUN 49; Creatinine, Ser 1.75; Potassium 3.6; Sodium 140   Recent Lipid Panel    Component Value Date/Time   CHOL 154 09/14/2023 0420   CHOL 135 08/08/2022 0838   TRIG 393 (H) 09/14/2023 0420   HDL 44 09/14/2023 0420   HDL 59 08/08/2022 0838   CHOLHDL 3.5 09/14/2023 0420   VLDL 79 (H) 09/14/2023 0420   LDLCALC 31 09/14/2023 0420   LDLCALC 39 08/08/2022 0838    No BP recorded.  {Refresh Note OR Click here to enter BP  :1}***    ECG personally reviewed by me today- ***     Echocardiogram 08/04/2023   IMPRESSIONS     1. Peak LVOT velocity 2.4 m/s, PG 23 mmHg. Left ventricular ejection  fraction, by estimation, is >75%. The left ventricle has hyperdynamic  function. The left ventricle has no regional wall motion abnormalities.  There is moderate left ventricular  hypertrophy of the basal-septal segment. Left ventricular diastolic  parameters are consistent with Grade I diastolic dysfunction (impaired  relaxation).   2. Right ventricular systolic function is normal. The right ventricular  size is normal.   3. The mitral valve is normal in structure. No evidence of mitral valve  regurgitation. No evidence of mitral stenosis.   4. The aortic valve is normal in structure. Aortic valve regurgitation is  not visualized. No aortic stenosis is present.   5. The inferior vena cava is normal in size with greater than 50%  respiratory variability, suggesting right atrial pressure of 3 mmHg.   Conclusion(s)/Recommendation(s): Findings consistent with hypertrophic  cardiomyopathy.   FINDINGS   Left Ventricle: Peak LVOT velocity 2.4 m/s, PG 23 mmHg. Left ventricular  ejection fraction, by estimation, is >75%. The left ventricle has   hyperdynamic function. The left ventricle has no regional wall motion  abnormalities. Definity  contrast agent was  given IV to delineate the left ventricular endocardial borders. The left  ventricular internal cavity size was normal in size. There is moderate  left ventricular hypertrophy of the basal-septal segment. Left ventricular  diastolic parameters are consistent   with Grade I diastolic dysfunction (impaired relaxation).   Right Ventricle: The right ventricular size is normal. No increase in  right ventricular wall thickness. Right ventricular systolic function is  normal.   Left Atrium: Left atrial size was normal in size.   Right Atrium: Right atrial size was normal in size.   Pericardium: There is no evidence of pericardial effusion.   Mitral Valve: The mitral valve is normal in structure. No evidence of  mitral valve regurgitation. No evidence of mitral valve stenosis.   Tricuspid Valve: The tricuspid valve is normal in structure. Tricuspid  valve regurgitation is not demonstrated. No evidence of tricuspid  stenosis.   Aortic Valve: The aortic valve is normal in structure. Aortic valve  regurgitation is not visualized. No aortic stenosis is present.   Pulmonic Valve: The pulmonic valve was normal in structure. Pulmonic valve  regurgitation is not visualized. No evidence of pulmonic stenosis.   Aorta: The aortic root is normal  in size and structure.   Venous: The inferior vena cava is normal in size with greater than 50%  respiratory variability, suggesting right atrial pressure of 3 mmHg.   IAS/Shunts: No atrial level shunt detected by color flow Doppler.     Assessment & Plan   1.  ***   Chet Cota. Damiah Mcdonald NP-C     10/05/2023, 1:21 PM [facility] [address] Office [phone] Fax [phone]    I spent***minutes examining this patient, reviewing medications, and using patient centered shared decision  making involving their cardiac care.   I spent  20 minutes reviewing past medical history,  medications, and prior cardiac tests.

## 2023-10-10 ENCOUNTER — Ambulatory Visit: Admitting: General Practice

## 2023-10-11 ENCOUNTER — Other Ambulatory Visit: Payer: Self-pay | Admitting: Internal Medicine

## 2023-10-11 ENCOUNTER — Ambulatory Visit: Payer: Self-pay | Admitting: Pulmonary Disease

## 2023-10-11 DIAGNOSIS — I421 Obstructive hypertrophic cardiomyopathy: Secondary | ICD-10-CM

## 2023-10-11 DIAGNOSIS — J4550 Severe persistent asthma, uncomplicated: Secondary | ICD-10-CM

## 2023-10-11 NOTE — Telephone Encounter (Signed)
 E2C2 Pulmonary Triage - Initial Assessment Questions "Chief Complaint (e.g., cough, sob, wheezing, fever, chills, sweat or additional symptoms) *Go to specific symptom protocol after initial questions. Patient calling to report that he is needing to have re-certification for oxygen completed per his insurance company. Patient with hx of COPD with continued complaints of shortness of breath. Patient endorses activity intolerance with his shortness of breath. Patient is instructed to go Urgent care or Emergency department if symptoms are not manageable. Patient reports using his inhaler and nebulizer without any issue. Patient reports needing an appointment scheduled with pulmonary. Patient is needing a follow up call from office tomorrow.   "How long have symptoms been present?" No new symptoms  Have you tested for COVID or Flu? Note: If not, ask patient if a home test can be taken. If so, instruct patient to call back for positive results. No  MEDICINES:   "Have you used any OTC meds to help with symptoms?" No If yes, ask "What medications?"   "Have you used your inhalers/maintenance medication?" Yes If yes, "What medications?" Albuterol  inhaler 2 puffs q4hrs PRN Breztri  2 puffs BID  If inhaler, ask "How many puffs and how often?" Note: Review instructions on medication in the chart. Breztri  2 puffs BID Albuterold 2 Puffs PRN  OXYGEN: "Do you wear supplemental oxygen?" Yes If yes, "How many liters are you supposed to use?" 3L  "Do you monitor your oxygen levels?" Yes If yes, "What is your reading (oxygen level) today?" 95%  "What is your usual oxygen saturation reading?"  (Note: Pulmonary O2 sats should be 90% or greater) 94-95%   Copied from CRM #[id number]. Topic: Clinical - Red Word Triage >> Oct 11, 2023  4:48 PM Eveleen Hinds B wrote: [facility] that prompted transfer to Nurse Triage: Some diff breathing, history of COPD Reason for Disposition  [1] MODERATE longstanding difficulty  breathing (e.g., speaks in phrases, SOB even at rest, pulse 100-120) AND [2] SAME as normal  Answer Assessment - Initial Assessment Questions 1. RESPIRATORY STATUS: "Describe your breathing?" (e.g., wheezing, shortness of breath, unable to speak, severe coughing)      Shortness of breath 2. ONSET: "When did this breathing problem begin?"      chronic 3. PATTERN "Does the difficult breathing come and go, or has it been constant since it started?"      constant 4. SEVERITY: "How bad is your breathing?" (e.g., mild, moderate, severe)    - MILD: No SOB at rest, mild SOB with walking, speaks normally in sentences, can lie down, no retractions, pulse < 100.    - MODERATE: SOB at rest, SOB with minimal exertion and prefers to sit, cannot lie down flat, speaks in phrases, mild retractions, audible wheezing, pulse 100-120.    - SEVERE: Very SOB at rest, speaks in single words, struggling to breathe, sitting hunched forward, retractions, pulse > 120      Moderate-baseline currently 5. RECURRENT SYMPTOM: "Have you had difficulty breathing before?" If Yes, ask: "When was the last time?" and "What happened that time?"      Yes-chronic 6. CARDIAC HISTORY: "Do you have any history of heart disease?" (e.g., heart attack, angina, bypass surgery, angioplasty)      CHF 7. LUNG HISTORY: "Do you have any history of lung disease?"  (e.g., pulmonary embolus, asthma, emphysema)     COPD  Protocols used: Breathing Difficulty-A-AH

## 2023-10-12 ENCOUNTER — Ambulatory Visit (HOSPITAL_COMMUNITY)
Admission: RE | Admit: 2023-10-12 | Discharge: 2023-10-12 | Disposition: A | Discharge status: Home / Self Care | Source: Ambulatory Visit | Attending: Cardiology | Admitting: Cardiology

## 2023-10-12 DIAGNOSIS — I493 Ventricular premature depolarization: Secondary | ICD-10-CM | POA: Diagnosis present

## 2023-10-12 DIAGNOSIS — I5033 Acute on chronic diastolic (congestive) heart failure: Secondary | ICD-10-CM

## 2023-10-12 DIAGNOSIS — I422 Other hypertrophic cardiomyopathy: Secondary | ICD-10-CM | POA: Diagnosis present

## 2023-10-12 LAB — ECHOCARDIOGRAM STRESS TEST
AR max vel: 3.62 cm2
AV Area VTI: 3.19 cm2
AV Area mean vel: 3.5 cm2
AV Mean grad: 7 mmHg
AV Peak grad: 12.3 mmHg
Ao pk vel: 1.75 m/s

## 2023-10-12 NOTE — Telephone Encounter (Signed)
 Patient is scheduled and wanted to know when he needed another dupixent  injection will send message to pharmacy.

## 2023-10-13 ENCOUNTER — Telehealth: Payer: Self-pay

## 2023-10-13 MED ORDER — DUPIXENT 300 MG/2ML ~~LOC~~ SOAJ: 300.0000 mg | SUBCUTANEOUS | 0 refills | Status: DC

## 2023-10-13 NOTE — Telephone Encounter (Signed)
 Called and spoke to pt. Advised that refill had been sent in this morning and that he'll need to contact [facility] pharmacy in order to schedule refill. Pt verbalized understanding. Nothing further is required at this time.

## 2023-10-13 NOTE — Addendum Note (Signed)
 Addended by: Thais Fill on: 10/13/2023 10:42 AM   Modules accepted: Orders

## 2023-10-13 NOTE — Telephone Encounter (Signed)
 I have already called the patient yesterday and he was scheduled for an appointment,he just wanted to know when he was due for another injection of Dupixent  which I am unaware of which is why I routed it to guys ,it can wait ill Monday

## 2023-10-13 NOTE — Telephone Encounter (Signed)
-----   Message from Thais Fill sent at 10/13/2023 10:42 AM EDT ----- Can you please call pt? I am OOO today

## 2023-10-13 NOTE — Progress Notes (Signed)
 Paitent adminsiters Dupixent  every 2 weeks. He should take dose 2 week after last injection. IF overdue, take as soon as possible and then retime subsequent doses every 2 weeks from then  Refills have been sent to [facility]. Patient can call to schedule shipment to home. [phone]   Geraldene Kleine, PharmD, MPH, BCPS, CPP Clinical Pharmacist (Rheumatology and Pulmonology)

## 2023-10-16 ENCOUNTER — Ambulatory Visit: Payer: Self-pay

## 2023-10-16 DIAGNOSIS — I5032 Chronic diastolic (congestive) heart failure: Secondary | ICD-10-CM

## 2023-10-18 NOTE — Therapy (Addendum)
 OUTPATIENT PHYSICAL THERAPY  LOWER EXTREMITY ONCOLOGY EVALUATION  Patient Name: Joseph Harvey MRN: [id number] DOB:[birth year], 68 y.o., male Today's Date: 10/19/2023  END OF SESSION:  PT End of Session - 10/19/23 0934     Visit Number 1    Number of Visits 1    Authorization Type needed for additional visits    PT Start Time 0803    PT Stop Time 0855    PT Time Calculation (min) 52 min    Activity Tolerance Patient tolerated treatment well    Behavior During Therapy WFL for tasks assessed/performed             Past Medical History:  Diagnosis Date   Allergy     Dust, mold, dust mites   Anemia    Anxiety    Asthma    Cancer (HCC)    prostate   Cataract    bilateral repair.   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   Pneumonia    Stress incontinence    Past Surgical History:  Procedure Laterality Date   BACK SURGERY  2017   CATARACT EXTRACTION Bilateral    COLONOSCOPY     2013   COLONOSCOPY  10/03/2019   KNEE ARTHROSCOPY  2005   right   PROSTATE BIOPSY     PROSTATE SURGERY     PVC ABLATION N/A 08/10/2021   Procedure: PVC ABLATION;  Surgeon: Lei Pump, MD;  Location: [facility];  Service: Cardiovascular;  Laterality: N/A;   ROBOT ASSISTED LAPAROSCOPIC RADICAL PROSTATECTOMY  03/2010   Patient Active Problem List   Diagnosis Date Noted   Pure hypercholesterolemia 09/13/2023   Shortness of breath 09/13/2023   Acute on chronic diastolic CHF (congestive heart failure) (HCC) 09/13/2023   Acute heart failure with preserved ejection fraction (HFpEF) (HCC) 09/09/2023   Coronary artery calcification seen on CAT scan 09/09/2023   Sinus tachycardia 09/09/2023   Mixed hyperlipidemia 09/09/2023   Benign hypertension 09/09/2023   AKI (acute  kidney injury) (HCC) 09/08/2023   Acute exacerbation of CHF (congestive heart failure) (HCC) 09/07/2023   COPD exacerbation (HCC) 06/20/2023   HOCM (hypertrophic obstructive cardiomyopathy) (HCC) 07/04/2022   Aortic atherosclerosis (HCC) 07/04/2022   PVC (premature ventricular contraction) 08/10/2021   Pain due to onychomycosis of toenails of both feet 08/21/2020   Panic disorder 11/15/2019   Lipomatosis 07/09/2019   Low back pain 09/25/2018   Lumbar radiculopathy 09/25/2018   Constipation 09/17/2018   History of prostate cancer 09/17/2018   Hyperlipidemia associated with type 2 diabetes mellitus (HCC) 09/17/2018   History of paranasal sinus congestion 07/20/2018   Tobacco dependence 07/20/2018   Controlled type 2 diabetes mellitus without complication, without long-term current use of insulin  (HCC) 07/20/2018   Vitamin D deficiency 08/18/2016   Hyperlipidemia LDL goal <100 08/10/2016   Renal insufficiency 07/11/2016   Family history of colon cancer 09/30/2015   GERD (gastroesophageal reflux disease) 09/30/2015   History of colon polyps 09/30/2015   Obesity, morbid (HCC) 03/07/2015   Allergic rhinitis 12/15/2014   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) 06/02/2014   Cough 07/25/2012   HBP (high blood pressure) 06/01/2012   Asthma, severe persistent 05/30/2012   Asthma 05/30/2012    PCP: Vevelyn Gowers, NP  REFERRING PROVIDER: Vevelyn Gowers, NP  REFERRING DIAG:  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 (ICD-10-CM) - 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  The Rt one was also big but not as bad.  Even leaking fluid.  They are not quite back to normal.  The fluid goes away with elevation.  They told me now to lay down with compressio non because of my heart.   PERTINENT HISTORY: hypertrophic cardiomyopathy with PVCs and preserved EF, stage 3 CKD, SOB, GFR 50-60, DM, hospitalization 09/06/23 due to fluid retention of 11# and cellulitis in the Lt LE, neg DVT. Hx of prostate cancer with radical prostatectomy in 2011 and radiation recently in 2025 due to a small spot that was left. Uses 3L o2 as needed especially when sleeping, more SOB with walking and laying flat. COPD  PAIN:  Are you having pain? YES  NPRS scale: 5/10 Pain location: upper back - I have a T5 compression fracture  Pain orientation: Bilateral  PAIN TYPE: aching and sharp Pain description: constant  Aggravating factors: laying down , lifting things  Relieving factors: sitting   PRECAUTIONS: CHF and CKD, hx of cellulitis   RED FLAGS: None   WEIGHT BEARING RESTRICTIONS: No  FALLS:  Has patient fallen in last 6 months? No  LIVING ENVIRONMENT: Lives with: lives alone  OCCUPATION: retired from [employer]   LEISURE: very little.    PRIOR LEVEL OF FUNCTION: Independent  PATIENT GOALS: not sure   OBJECTIVE: Note: Objective measures were completed at Evaluation unless otherwise noted.  COGNITION: Overall cognitive status: Within functional limits for tasks assessed   PALPATION: Rt leg: +2 pitting edema  foot and shin/lower leg and +3 left leg shin with +1-2 other locations.  Shiny tight skin in general.   OBSERVATIONS / OTHER ASSESSMENTS: left leg has a small circular wound on the anterior shin with dry white granulation tissue.  Ext .4cm diameter.   LYMPHEDEMA ASSESSMENTS:   LOWER EXTREMITY  LANDMARK RIGHT eval  At groin   30 cm proximal to suprapatella  20 cm proximal to suprapatella   10 cm proximal to suprapatella   At midpatella / popliteal crease 43  30 cm proximal to floor at lateral plantar foot 35.8  20 cm proximal to floor at lateral plantar foot 28  10 cm proximal to floor at lateral plantar foot 26  Circumference of ankle/heel   5 cm proximal to 1st MTP joint   Across MTP joint 26  Around proximal great toe 10  (Blank rows = not tested)  LOWER EXTREMITY LANDMARK LEFT eval  At groin   30 cm proximal to suprapatella   20 cm proximal to suprapatella   10 cm proximal to suprapatella   At midpatella / popliteal crease 45  30 cm proximal to floor at lateral plantar foot 39.5  20 cm proximal to floor at lateral plantar foot 29.4  10 cm proximal to floor at lateral plantar foot 27.4  Circumference of ankle/heel   5 cm proximal to 1st MTP joint   Across MTP joint 25.3  Around proximal great toe 9.0  (Blank rows = not tested)  44cm length to knee                                                                                                                            TREATMENT DATE:  10/19/23 Eval performed Discussed heart related edema vs lymphedema and how we would not bandage him due to heart and kidney status as well as instruction not to be in compression when laying down.   Discussed pts current choice of socks which are zipper stockings from [employer].  He reports they work well until last time when it was out of control.  Showed him how to measure size and showed him an example from [place] that is 20-30 closed toe zipper.   Edu on sleeping elevated and then putting on socks ASAP for the day and letting the MD know about any breakthrough edema or SOB like last time.   Will also order [employer] walker zipper XL regular stockings with insurance usage if able.     PATIENT EDUCATION:  Education details: per today's note Person educated: Patient Education  method: Explanation Education comprehension: verbalized understanding  HOME EXERCISE PROGRAM:   ASSESSMENT:  CLINICAL IMPRESSION: Patient is a 68 y.o. male who was seen today for physical therapy evaluation and treatment for his bil LE lymphedema that has been chronic and worsened recently by acute CHF with cellulitis.  He showed me pictures of the leg in the hospital and it looks much better at this point.  He has pretty unstable CHF with orthopnea and orders not to wear compression when lying down.  We discussed how bandaging would not be appropriate and he was in agreement.  He currently uses zipper compression garments at home from [employer] which he likes but hasn't used in a while.  Showed him how to read the size chart and how importance a correct size is.  And how  moving up in compression is necessary if containment is not achieved.  Pt would like to order a medical grade pair online as well.  He would benefit from a 20-1mmhg closed toe zipper side compression garment for ease of donning.  He has a small open wound in the left leg but it is healing nicely.     OBJECTIVE IMPAIRMENTS: decreased knowledge of condition, decreased knowledge of use of DME, and increased edema.   ACTIVITY LIMITATIONS: carrying, lifting, standing, and sleeping  PARTICIPATION LIMITATIONS: meal prep, cleaning, laundry, and community activity  PERSONAL FACTORS: Age, Fitness, Time since onset of injury/illness/exacerbation, and 1 comorbidity: CHF, ckd are also affecting patient's functional outcome.   REHAB POTENTIAL: Good  CLINICAL DECISION MAKING: Evolving/moderate complexity  EVALUATION COMPLEXITY: Moderate   GOALS: Goals reviewed with patient? Yes  SHORT TERM GOALS: Target date: 10/19/23  Pt will be educated on safe ways to manage CHF related edema including elevation, compression, and exercise as well as medical management as needed.  Baseline: Goal status: MET  PLAN:  PT FREQUENCY: one time  visit  PT DURATION: 1 week  PLANNED INTERVENTIONS: 97164- PT Re-evaluation, 97535- Self Care, 16109- Manual therapy, [phone]- Orthotic/Prosthetic subsequent, Patient/Family education, Balance training, Joint mobilization, Therapeutic exercises, Therapeutic activity, Neuromuscular re-education, Gait training, and Self Care  PLAN FOR NEXT SESSION: as needed  Ordered faxed to [facility] 10/27/23   Encarnacion Harris, PT 10/19/2023, 9:51 AM  PHYSICAL THERAPY DISCHARGE SUMMARY  Visits from Start of Care: 1  Current functional level related to goals / functional outcomes: Per above   Remaining deficits: Per above   Education / Equipment:  Per above  Plan: Patient agrees to discharge.  Patient is being discharged due to meeting the stated rehab goals.

## 2023-10-19 ENCOUNTER — Other Ambulatory Visit: Payer: Self-pay

## 2023-10-19 ENCOUNTER — Encounter: Payer: Self-pay | Admitting: Rehabilitation

## 2023-10-19 ENCOUNTER — Ambulatory Visit: Attending: Nurse Practitioner | Admitting: Rehabilitation

## 2023-10-19 DIAGNOSIS — Z8546 Personal history of malignant neoplasm of prostate: Secondary | ICD-10-CM | POA: Diagnosis present

## 2023-10-19 DIAGNOSIS — I890 Lymphedema, not elsewhere classified: Secondary | ICD-10-CM | POA: Diagnosis present

## 2023-10-19 DIAGNOSIS — I5033 Acute on chronic diastolic (congestive) heart failure: Secondary | ICD-10-CM | POA: Diagnosis present

## 2023-10-24 MED ORDER — VERAPAMIL HCL ER 120 MG PO TBCR: 120.0000 mg | EXTENDED_RELEASE_TABLET | Freq: Every day | ORAL | 1 refills | Status: DC

## 2023-11-03 NOTE — Telephone Encounter (Signed)
 Called pt advised of MD response  Let's see how his BMP looks.  Glad to hear he is feeling better- if stable and continue to improve will continue his torsemide  as scheduled, if creatinine not improving (was normal) will decrease 40  mg as recommended.   Pt reports is having car trouble but will have lab work drawn soon.

## 2023-11-12 ENCOUNTER — Encounter: Payer: Self-pay | Admitting: Internal Medicine

## 2023-11-16 ENCOUNTER — Ambulatory Visit (HOSPITAL_COMMUNITY)

## 2023-11-24 ENCOUNTER — Ambulatory Visit (HOSPITAL_COMMUNITY)

## 2023-11-28 ENCOUNTER — Encounter: Payer: Self-pay | Admitting: Internal Medicine

## 2023-11-28 ENCOUNTER — Ambulatory Visit: Attending: Cardiology | Admitting: Internal Medicine

## 2023-11-28 ENCOUNTER — Telehealth: Payer: Self-pay | Admitting: Pharmacist

## 2023-11-28 VITALS — BP 123/78 | HR 97 | Ht 69.0 in | Wt 249.0 lb

## 2023-11-28 DIAGNOSIS — N179 Acute kidney failure, unspecified: Secondary | ICD-10-CM | POA: Diagnosis not present

## 2023-11-28 DIAGNOSIS — I493 Ventricular premature depolarization: Secondary | ICD-10-CM

## 2023-11-28 DIAGNOSIS — I5033 Acute on chronic diastolic (congestive) heart failure: Secondary | ICD-10-CM

## 2023-11-28 DIAGNOSIS — I421 Obstructive hypertrophic cardiomyopathy: Secondary | ICD-10-CM | POA: Diagnosis not present

## 2023-11-28 DIAGNOSIS — I2510 Atherosclerotic heart disease of native coronary artery without angina pectoris: Secondary | ICD-10-CM

## 2023-11-28 MED ORDER — NICOTINE 7 MG/24HR TD PT24: 7.0000 mg | MEDICATED_PATCH | Freq: Every day | TRANSDERMAL | 0 refills | Status: DC

## 2023-11-28 MED ORDER — MAVACAMTEN 5 MG PO CAPS: 5.0000 mg | ORAL_CAPSULE | Freq: Every day | ORAL | 0 refills | Status: DC

## 2023-11-28 NOTE — Patient Instructions (Signed)
 Medication Instructions:  Your physician has recommended you make the following change in your medication:  STOP: verapamil  5 days prior to starting mavacamten  (Camzyos )   START: mavacamten  (Camzyos ) 5 mg by mouth once daily ; You are projected to start on Jan 01, 2024.  If you receive medication prior to this date do not start early.  *If you need a refill on your cardiac medications before your next appointment, please call your pharmacy*  Lab Work: Please get Lab Work previously requested by Dr. Santo  If you have labs (blood work) drawn today and your tests are completely normal, you will receive your results only by: MyChart Message (if you have MyChart) OR A paper copy in the mail If you have any lab test that is abnormal or we need to change your treatment, we will call you to review the results.  Testing/Procedures: You will need an Echocardiogram 4,8, and 12 weeks after starting Camzyos . Echo #1 Sept 8-14 Echo #2 Oct 1-6 Echo #3 Oct 29-Nov 4  Your physician has requested that you have an echocardiogram. Echocardiography is a painless test that uses sound waves to create images of your heart. It provides your doctor with information about the size and shape of your heart and how well your heart's chambers and valves are working. This procedure takes approximately one hour. There are no restrictions for this procedure. Please do NOT wear cologne, perfume, aftershave, or lotions (deodorant is allowed). Please arrive 15 minutes prior to your appointment time.  Please note: We ask at that you not bring children with you during ultrasound (echo/ vascular) testing. Due to room size and safety concerns, children are not allowed in the ultrasound rooms during exams. Our front office staff cannot provide observation of children in our lobby area while testing is being conducted. An adult accompanying a patient to their appointment will only be allowed in the ultrasound room at the  discretion of the ultrasound technician under special circumstances. We apologize for any inconvenience.   Follow-Up: At [facility], you and your health needs are our priority.  As part of our continuing mission to provide you with exceptional heart care, our providers are all part of one team.  This team includes your primary Cardiologist (physician) and Advanced Practice Providers or APPs (Physician Assistants and Nurse Practitioners) who all work together to provide you with the care you need, when you need it.  Your next appointment:   16 week(s)  Provider:   Stanly Santo, MD

## 2023-11-28 NOTE — Progress Notes (Signed)
 Cardiology Office Note:  .    Date:  11/28/2023  ID:  Joseph Harvey, DOB [birth year], MRN [id number] PCP: Arloa Jarvis, NP  [facility] Providers Cardiologist:  Lonni LITTIE Nanas, MD Electrophysiologist:  Soyla Gladis Norton, MD     CC: HCM follow up  History of Present Illness: .    Joseph Harvey is a 68 y.o. male  with hypertrophic cardiomyopathy who presents for complex management of his condition and tobacco abuse.  Has oHCM  He experiences shortness of breath and chest pain, particularly during exertion, such as carrying objects or walking. The shortness of breath can occur unexpectedly, even when sitting or walking down a hallway. The chest pain is described as occurring 'right around here' and is sometimes accompanied by numbness and tingling in the left arm, which can feel weak, especially when carrying items.  He has a history of obstructive hypertrophic cardiomyopathy, frequent premature ventricular contractions, supraventricular tachycardia, non-sustained ventricular tachycardia, and heart failure with preserved ejection fraction. He underwent PVC ablation and has an MYBC3 gene mutation. He also has a history of elevated coronary artery calcium  score and aortic atherosclerosis.  He has significant COPD and is a current tobacco user. He has attempted to quit smoking, achieving three weeks smoke-free at one point, and is using patches to aid cessation. He also reports morbid obesity and a recent acute kidney injury.  He experiences dizziness and lightheadedness, particularly when standing up, which is improving. He has reduced his fluid pill intake to one in the morning and one in the afternoon, with an additional dose if needed. His weight fluctuates, and he mentions it was 235 pounds without clothes.  He reports a history of a fractured disc in his back, causing pain and shortness of breath when pressure is applied. He has not undergone surgery but is considering an MRI  with contrast due to claustrophobia during previous scans. He also mentions being told he has osteoporosis.  He experiences symptoms of depression, feeling 'kind of down' and unsure about what is happening with his health. He has not yet discussed this with his healthcare provider.  His current medications include verapamil , which he takes at night but reports it keeps him awake, and metoprolol . He also takes torsemide  20 mg twice daily with an additional dose as needed, and potassium supplements when taking the fluid pill. He mentions a reduction in valsartan  dosage due to low blood pressure and kidney issues.  Relevant histories: .  Social  - family history of SCD ROS: As per HPI.   Studies Reviewed: .     Cardiac Studies & Procedures   ______________________________________________________________________________________________   STRESS TESTS  ECHOCARDIOGRAM STRESS TEST 10/12/2023  Narrative EXERCISE STRESS ECHO REPORT   --------------------------------------------------------------------------------  Patient Name:   Joseph Harvey  Date of Exam: 10/12/2023 Medical Rec #:  [id number]     Height:       69.0 in Accession #:    [id number]    Weight:       238.8 lb Date of Birth:  June 20, 1955     BSA:          2.228 m Patient Age:    68 years      BP:           126/88 mmHg Patient Gender: M             HR:           101 bpm. Exam Location:  [facility]  Procedure: Cardiac Doppler,  Color Doppler, Stress Echo and Limited Echo  Indications:    I42.1 HOCM  History:        Patient has prior history of Echocardiogram examinations, most recent 08/04/2023. Morbid Obesity, HOCM, GERD, Heart Failure with Preserved EF (HFpEF).  Sonographer:    Heather Hawks RDCS Referring Phys: DAPHNE LESCHES   Sonographer Comments: Image acquisition challenging due to patient body habitus, Image acquisition challenging due to respiratory motion and patient has difficulty staying in a LLD position.  [facility] DPR SIGNED ON 09/19/19. OK TO SPEAK TO BROTHER JONAH Rote AND DAUGHTER NIKAY KELLY IMPRESSIONS   1. After repetitive squat and stand maneuver, LVOT gradient is 33 mmHG and MR is mild. 2. Patient did not exercise on the treadmill. 3. Resting Echo: LVEF >75% (hyperdynamic), systolic anterior motion of mitral valve with mild MR, mild TR, no AR or AS, normal aortic root and proximal ascending aorta size. Peak LVOT gradient at rest . 4. Non-diagnostic study for ischemia due to the study being performed for LVOT obstruction assessment.  FINDINGS  Exam Protocol: The patient exercised on a treadmill according to a HOCM Squat to Stand protocol. Patient Completed Resting/Valsalva Imaging, and a 10 Second Squat to Stand. Completed Testing with Gradients increasing above per protocol.   Patient Performance: The patient exercised for 0 minutes and 0 seconds,. The baseline heart rate was 107 bpm. The heart rate at peak stress was 126 bpm. The target heart rate was calculated to be 130 bpm. The percentage of maximum predicted heart rate achieved was 82.4 %. The baseline blood pressure was 114/72 mmHg. The blood pressure at peak stress was 112/77 mmHg. The blood pressure response was flat. The patient developed fatigue, leg fatigue and shortness of breath during the stress exam. The symptoms did not resolve. Shortness of breath at baseline, on 3L of O2.  EKG: Resting EKG showed sinus tachycardia with No stress EKG.   2D Echo Findings: This is a non-diagnostic study for ischemia due to the study being performed for LVOT obstruction assessment.   Sunit Tolia Electronically signed on 10/12/2023 at 7:40:16 PM      Final   ECHOCARDIOGRAM  ECHOCARDIOGRAM COMPLETE 08/04/2023  Narrative ECHOCARDIOGRAM REPORT    Patient Name:   Joseph Harvey  Date of Exam: 08/04/2023 Medical Rec #:  [id number]     Height:       70.0 in Accession #:    [id number]    Weight:       235.0 lb Date of  Birth:  Aug 03, 1955     BSA:          2.235 m Patient Age:    67 years      BP:           120/60 mmHg Patient Gender: M             HR:           105 bpm. Exam Location:  [address]  Procedure: 2D Echo, Cardiac Doppler, Color Doppler and Intracardiac Opacification Agent (Both Spectral and Color Flow Doppler were utilized during procedure).  Indications:    I42.1 HOCM  History:        Patient has prior history of Echocardiogram examinations, most recent 09/05/2022. Risk Factors:Hypertension, Diabetes, Dyslipidemia and Current Smoker.  Sonographer:    Carl Rodgers-Jones RDCS Referring Phys: [id number] Joseph L SCHUMANN  IMPRESSIONS   1. Peak LVOT velocity 2.4 m/s, PG 23 mmHg. Left ventricular ejection fraction, by estimation, is >75%. The left ventricle  has hyperdynamic function. The left ventricle has no regional wall motion abnormalities. There is moderate left ventricular hypertrophy of the basal-septal segment. Left ventricular diastolic parameters are consistent with Grade I diastolic dysfunction (impaired relaxation). 2. Right ventricular systolic function is normal. The right ventricular size is normal. 3. The mitral valve is normal in structure. No evidence of mitral valve regurgitation. No evidence of mitral stenosis. 4. The aortic valve is normal in structure. Aortic valve regurgitation is not visualized. No aortic stenosis is present. 5. The inferior vena cava is normal in size with greater than 50% respiratory variability, suggesting right atrial pressure of 3 mmHg.  Conclusion(s)/Recommendation(s): Findings consistent with hypertrophic cardiomyopathy.  FINDINGS Left Ventricle: Peak LVOT velocity 2.4 m/s, PG 23 mmHg. Left ventricular ejection fraction, by estimation, is >75%. The left ventricle has hyperdynamic function. The left ventricle has no regional wall motion abnormalities. Definity  contrast agent was given IV to delineate the left ventricular endocardial  borders. The left ventricular internal cavity size was normal in size. There is moderate left ventricular hypertrophy of the basal-septal segment. Left ventricular diastolic parameters are consistent with Grade I diastolic dysfunction (impaired relaxation).  Right Ventricle: The right ventricular size is normal. No increase in right ventricular wall thickness. Right ventricular systolic function is normal.  Left Atrium: Left atrial size was normal in size.  Right Atrium: Right atrial size was normal in size.  Pericardium: There is no evidence of pericardial effusion.  Mitral Valve: The mitral valve is normal in structure. No evidence of mitral valve regurgitation. No evidence of mitral valve stenosis.  Tricuspid Valve: The tricuspid valve is normal in structure. Tricuspid valve regurgitation is not demonstrated. No evidence of tricuspid stenosis.  Aortic Valve: The aortic valve is normal in structure. Aortic valve regurgitation is not visualized. No aortic stenosis is present.  Pulmonic Valve: The pulmonic valve was normal in structure. Pulmonic valve regurgitation is not visualized. No evidence of pulmonic stenosis.  Aorta: The aortic root is normal in size and structure.  Venous: The inferior vena cava is normal in size with greater than 50% respiratory variability, suggesting right atrial pressure of 3 mmHg.  IAS/Shunts: No atrial level shunt detected by color flow Doppler.   LEFT VENTRICLE PLAX 2D LVIDd:         5.00 cm   Diastology LV PW:         0.80 cm   LV e' medial:    6.75 cm/s LV IVS:        1.50 cm   LV E/e' medial:  9.0 LVOT diam:     2.10 cm   LV e' lateral:   9.56 cm/s LV SV:         73        LV E/e' lateral: 6.4 LV SV Index:   33 LVOT Area:     3.46 cm   RIGHT VENTRICLE             IVC RV Basal diam:  4.40 cm     IVC diam: 1.00 cm RV S prime:     13.37 cm/s TAPSE (M-mode): 2.8 cm  LEFT ATRIUM             Index        RIGHT ATRIUM           Index LA Vol  (A2C):   34.2 ml 15.30 ml/m  RA Area:     14.40 cm LA Vol (A4C):   47.1 ml 21.07 ml/m  RA Volume:   40.30 ml  18.03 ml/m LA Biplane Vol: 42.1 ml 18.83 ml/m AORTIC VALVE LVOT Vmax:   121.33 cm/s LVOT Vmean:  81.500 cm/s LVOT VTI:    0.211 m  AORTA Ao Root diam: 3.50 cm Ao Asc diam:  3.20 cm  MITRAL VALVE MV Area (PHT): 5.01 cm     SHUNTS MV Decel Time: 152 msec     Systemic VTI:  0.21 m MV E velocity: 60.80 cm/s   Systemic Diam: 2.10 cm MV A velocity: 100.00 cm/s MV E/A ratio:  0.61  Oneil Parchment MD Electronically signed by Oneil Parchment MD Signature Date/Time: 08/04/2023/3:15:53 PM    Final    MONITORS  LONG TERM MONITOR-LIVE TELEMETRY (3-14 DAYS) 04/18/2023  Narrative   Frequent PVCs (7.7% of beats)   7 episodes of NSVT, longest lasting 12 beats   4 episodes of SVT, longest lasting 18 beats   Patch Wear Time:  14 days and 14 hours (2024-11-01T02:49:03-399 to 2024-11-20T02:16:45-0500)  Monitor 1-1 day 6 hours Patient had a min HR of 84 bpm, max HR of 130 bpm, and avg HR of 102 bpm. Predominant underlying rhythm was Sinus Rhythm. Isolated SVEs were rare (<1.0%), and no SVE Couplets or SVE Triplets were present. Isolated VEs were occasional (2.6%, 3058), VE Couplets were rare (<1.0%, 61), and no VE Triplets were present. Ventricular Bigeminy and Trigeminy were present.  Monitor 2-13 days 8 hours Patient had a min HR of 71 bpm, max HR of 200 bpm, and avg HR of 99 bpm. Predominant underlying rhythm was Sinus Rhythm. 7 Ventricular Tachycardia runs occurred, the run with the fastest interval lasting 4 beats with a max rate of 176 bpm, the longest lasting 12 beats with an avg rate of 103 bpm. 4 Supraventricular Tachycardia runs occurred, the run with the fastest interval lasting 5 beats with a max rate of 200 bpm, the longest lasting 18 beats with an avg rate of 159 bpm. Isolated SVEs were rare (<1.0%), SVE Couplets were rare (<1.0%), and SVE Triplets were rare (<1.0%).  Isolated VEs were frequent (7.7%, [id number]), VE Couplets were rare (<1.0%, 2351), and VE Triplets were rare (<1.0%, 345). Ventricular Bigeminy and Trigeminy were present.   CT SCANS  CT CARDIAC SCORING (SELF PAY ONLY) 04/29/2020  Addendum 05/03/2020 10:16 PM ADDENDUM REPORT: 05/03/2020 22:14  CLINICAL DATA:  Risk stratification  EXAM: Coronary Calcium  Score  TECHNIQUE: The patient was scanned on a [employer] scanner. Axial non-contrast 3 mm slices were carried out through the heart. The data set was analyzed on a dedicated work station and scored using the Agatson method.  FINDINGS: Non-cardiac: See separate report from [facility].  Ascending Aorta: Normal  Pericardium: Normal  Coronary arteries: Calcium  score 117  IMPRESSION: Coronary calcium  score of 117. This was 81st percentile for age and sex matched control.   Electronically Signed By: Lonni Nanas MD On: 05/03/2020 22:14  Narrative EXAM: OVER-READ INTERPRETATION  CT CHEST  The following report is an over-read performed by radiologist Dr. Marcey Moan of [facility] on 04/29/2020. This over-read does not include interpretation of cardiac or coronary anatomy or pathology. The coronary calcium  score interpretation by the cardiologist is attached.  COMPARISON:  CTA of the chest on 07/24/2014  FINDINGS: Vascular: No significant vascular findings. Normal heart size. No pericardial effusion.  Mediastinum/Nodes: Visualized mediastinum and hilar regions demonstrate no lymphadenopathy or masses.  Lungs/Pleura: Visualized lungs show no evidence of pulmonary edema, consolidation, pneumothorax, nodule or pleural fluid.  Upper Abdomen: No acute abnormality.  Musculoskeletal: No chest wall mass or suspicious bone lesions identified.  IMPRESSION: No significant incidental findings.  Electronically Signed: By: Marcey Moan M.D. On: 04/29/2020 09:28   CARDIAC MRI  MR  CARDIAC MORPHOLOGY W WO CONTRAST 06/22/2020  Narrative CLINICAL DATA:  52M evaluate for HCM  EXAM: CARDIAC MRI  TECHNIQUE: The patient was scanned on a 1.5 Tesla Siemens magnet. A dedicated cardiac coil was used. Functional imaging was done using Fiesta sequences. 2,3, and 4 chamber views were done to assess for RWMA's. Modified Simpson's rule using a short axis stack was used to calculate an ejection fraction on a dedicated work [employer]. The patient received 10 cc of Gadavist . After 10 minutes inversion recovery sequences were used to assess for infiltration and scar tissue.  CONTRAST:  10 cc  of Gadavist   FINDINGS: Left ventricle:  -Asymmetric hypertrophy measuring up to 20mm in basal anteroseptum (8mm in posterior wall)  -Normal size  -Normal systolic function  -Mild ECV elevation (29%)  -Patchy LGE in basal septum and RV insertion sites. LGE accounts for 11% of total myocardial mass  LV EF: 63% (Normal 56-78%)  Absolute volumes:  LV EDV: (Normal 77-195 mL)  LV ESV: 43mL (Normal 19-72 mL)  LV SV: 73mL (Normal 51-133 mL)  CO: 6.2L/min (Normal 2.8-8.8 L/min)  Indexed volumes:  LV EDV: 56mL/sq-m (Normal 47-92 mL/sq-m)  LV ESV: 99mL/sq-m (Normal 13-30 mL/sq-m)  LV SV: 84mL/sq-m (Normal 32-62 mL/sq-m)  CI: 2.8L/min/sq-m (Normal 1.7-4.2 L/min/sq-m)  Right ventricle: Normal size and systolic function  RV EF:  63% (Normal 47-74%)  Absolute volumes:  RV EDV: (Normal 88-227 mL)  RV ESV: 50mL (Normal 23-103 mL)  RV SV: 82mL (Normal 52-138 mL)  CO: 7.0L/min (Normal 2.8-8.8 L/min)  Indexed volumes:  RV EDV: 38mL/sq-m (Normal 55-105 mL/sq-m)  RV ESV: 33mL/sq-m (Normal 15-43 mL/sq-m)  RV SV: 42mL/sq-m (Normal 32-64 mL/sq-m)  CI: 3.1L/min/sq-m (Normal 1.7-4.2 L/min/sq-m)  Left atrium: Mild enlargement  Right atrium: Normal size  Mitral valve: No regurgitation  Aortic valve: No regurgitation  Tricuspid valve:  No regurgitation  Pulmonic valve: No regurgitation  Aorta: Normal proximal ascending aorta  Pericardium: Normal  IMPRESSION: 1. Asymmetric hypertrophy measuring up to 20mm in basal anteroseptum (8mm in posterior wall), consistent with hypertrophic cardiomyopathy  2. Patchy LGE in basal septum and RV insertion sites, consistent with HCM. LGE accounts for 11% of total myocardial mass  3. Normal LV size and systolic function (EF 63%)  4. Normal RV size and systolic function (EF 63%)   Electronically Signed By: Lonni Nanas MD On: 06/23/2020 22:42   ______________________________________________________________________________________________      Physical Exam:    VS:  There were no vitals taken for this visit.   Wt Readings from Last 3 Encounters:  09/21/23 238 lb 12.8 oz (108.3 kg)  09/20/23 241 lb 3.2 oz (109.4 kg)  09/14/23 245 lb 6 oz (111.3 kg)    Gen: no distress, morbid obesity   Neck: No JVD Cardiac: No Rubs or Gallops, systolic murmur, regular tachycardia, +2 radial pulses Respiratory: Clear to auscultation bilaterally, normal effort, normal  respiratory rate GI: Soft, nontender, distended with dullness to percussion  MS: trace bilateral pitting edema;  moves all extremities Integument: Skin feels warm, skin wounds are improving Neuro:  At time of evaluation, alert and oriented to person/place/time/situation  Psych: Normal affect, patient feels frustrated   ASSESSMENT AND PLAN: .     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 Symptoms include atypical shortness of breath and arm discomfort. Previous stress test in 2021 was normal. CT scan not feasible due to high resting heart rate. - Order nuclear medicine stress test if back surgery is planned (cannot do CCTA with current heart rates), < 4 fMETS  Heart Failure with Preserved Ejection Fraction Symptoms include shortness of breath and fluid retention. Current treatment includes torsemide  and Jardiance . Blood pressure well-managed. Awaiting BMP results to assess kidney function and adjust diuretics if necessary. - Continue torsemide  20 mg BID with 20 mg PRN - Obtain BMP to assess kidney function (did not do labs after our last visit)  Recent Acute Kidney Injury Recent AKI with elevated kidney function tests. Monitoring required to ensure stability and adjust medications accordingly. - Obtain BMP to assess kidney function - Adjust medications based on BMP results  Chronic Obstructive Pulmonary Disease (COPD) Significant COPD requiring oxygen. Current tobacco use exacerbates condition. Progress made in smoking cessation with periods of abstinence. - Provide additional nicotine  patches to aid smoking cessation; he did 3 weeks tobacco free   Depression Symptoms of depression reported. Discussed avoiding fluoxetine due to potential interaction with cardiac medications. 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 - no plan for gene therapy - he has two daughters and a brother, they all live in [place], we reviewed genetic screening for this; one of his daughters works in health care; ultimately they could be all screened at [facility] (patient prefers [facility]) will send more information to patient  SCD  Assessment - followed by EP, hx PVC ablation hx of NSVT  - hx of SCD in the family - no active NSVT; as he improved  we will re-address primary prevention ICD (age is a relative protective factor for him)  Atrial fibrillation Assessment  - HCM-AF score deferred- more LA diameter measurement   16 week f/u with me   Time Spent Directly with Patient:   I have spent a total of 63 minutes with the patient reviewing notes, imaging, EKGs, labs, and examining the patient as well as establishing an assessment and plan that was discussed personally with the patient. Discussed disease state education, reviewed results of his genetic testing.   Stanly Leavens, MD FASE [facility] Cardiologist [facility]  [address] [place], [address] [place], [place] [address] [phone]  9:44 AM

## 2023-11-28 NOTE — Telephone Encounter (Signed)
 PA for Camzyos  sumbitted (Key: [id number]) Approved through 05/15/24 Start form faxed He is dual eligible- medicare/medicaid. Should be no cost

## 2023-11-29 LAB — BASIC METABOLIC PANEL WITH GFR
BUN/Creatinine Ratio: 16 (ref 10–24)
BUN: 21 mg/dL (ref 8–27)
CO2: 23 mmol/L (ref 20–29)
Calcium: 9.6 mg/dL (ref 8.6–10.2)
Chloride: 96 mmol/L (ref 96–106)
Creatinine, Ser: 1.28 mg/dL — ABNORMAL HIGH (ref 0.76–1.27)
Glucose: 200 mg/dL — ABNORMAL HIGH (ref 70–99)
Potassium: 4.4 mmol/L (ref 3.5–5.2)
Sodium: 139 mmol/L (ref 134–144)
eGFR: 61 mL/min/1.73 (ref >59)

## 2023-12-01 NOTE — Telephone Encounter (Signed)
 FYI only--see pt note.

## 2023-12-13 NOTE — Telephone Encounter (Signed)
 error

## 2023-12-15 ENCOUNTER — Ambulatory Visit (HOSPITAL_COMMUNITY)
Admission: RE | Admit: 2023-12-15 | Discharge: 2023-12-15 | Disposition: A | Discharge status: Home / Self Care | Source: Ambulatory Visit | Attending: Acute Care | Admitting: Acute Care

## 2023-12-15 DIAGNOSIS — Z87891 Personal history of nicotine dependence: Secondary | ICD-10-CM | POA: Diagnosis present

## 2023-12-15 DIAGNOSIS — Z122 Encounter for screening for malignant neoplasm of respiratory organs: Secondary | ICD-10-CM | POA: Insufficient documentation

## 2023-12-15 DIAGNOSIS — F17210 Nicotine dependence, cigarettes, uncomplicated: Secondary | ICD-10-CM | POA: Insufficient documentation

## 2023-12-17 ENCOUNTER — Encounter: Payer: Self-pay | Admitting: Internal Medicine

## 2023-12-18 ENCOUNTER — Telehealth: Payer: Self-pay | Admitting: Internal Medicine

## 2023-12-18 NOTE — Telephone Encounter (Signed)
 Patient states that he supposed to have an echo on 8/18. Explain to the patient that was never schedule appt for that, went over the dates that was on discharge paperwork. Still insists that there is suppose to be one done on 8/18. Please advise

## 2023-12-18 NOTE — Telephone Encounter (Signed)
 See MyChart messages.

## 2023-12-28 ENCOUNTER — Other Ambulatory Visit: Payer: Self-pay | Admitting: Acute Care

## 2023-12-28 DIAGNOSIS — F17210 Nicotine dependence, cigarettes, uncomplicated: Secondary | ICD-10-CM

## 2023-12-28 DIAGNOSIS — Z122 Encounter for screening for malignant neoplasm of respiratory organs: Secondary | ICD-10-CM

## 2023-12-28 DIAGNOSIS — Z87891 Personal history of nicotine dependence: Secondary | ICD-10-CM

## 2024-01-01 ENCOUNTER — Other Ambulatory Visit: Payer: Self-pay | Admitting: Cardiology

## 2024-01-01 NOTE — Addendum Note (Signed)
 Addended by: RANDY HAMP SAILOR on: 01/01/2024 05:53 PM   Modules accepted: Orders

## 2024-01-01 NOTE — Telephone Encounter (Signed)
*  STAT* If patient is at the pharmacy, call can be transferred to refill team.   1. Which medications need to be refilled? (please list name of each medication and dose if known)   mavacamten  (CAMZYOS ) 5 MG CAPS capsule    2. Which pharmacy/location (including street and city if local pharmacy) is medication to be sent to? [facility], [place] - [address] Phone: [phone]  Fax: [phone]   3. Do they need a 30 day or 90 day supply? 90  Pharmacy received prior auth but not scripted attached.

## 2024-01-01 NOTE — Telephone Encounter (Signed)
 Called pt to f/u mavacamten  start.  Pt reports stated medication today.  Echocardiograms rescheduled appropriately.  All questions answered.

## 2024-01-02 MED ORDER — MAVACAMTEN 5 MG PO CAPS: 5.0000 mg | ORAL_CAPSULE | Freq: Every day | ORAL | 0 refills | Status: DC

## 2024-01-02 NOTE — Telephone Encounter (Signed)
 Pt of Dr. Santo asking for a refill of Camzyos .

## 2024-01-04 ENCOUNTER — Encounter: Payer: Self-pay | Admitting: Pulmonary Disease

## 2024-01-04 ENCOUNTER — Ambulatory Visit: Admitting: Pulmonary Disease

## 2024-01-04 VITALS — BP 113/87 | HR 99 | Temp 97.6°F | Ht 69.0 in | Wt 249.0 lb

## 2024-01-04 DIAGNOSIS — J45901 Unspecified asthma with (acute) exacerbation: Secondary | ICD-10-CM | POA: Diagnosis not present

## 2024-01-04 DIAGNOSIS — F17210 Nicotine dependence, cigarettes, uncomplicated: Secondary | ICD-10-CM | POA: Diagnosis not present

## 2024-01-04 DIAGNOSIS — J9691 Respiratory failure, unspecified with hypoxia: Secondary | ICD-10-CM | POA: Diagnosis not present

## 2024-01-04 DIAGNOSIS — J4540 Moderate persistent asthma, uncomplicated: Secondary | ICD-10-CM

## 2024-01-04 MED ORDER — PREDNISONE 10 MG PO TABS: 30.0000 mg | ORAL_TABLET | Freq: Every day | ORAL | 1 refills | Status: DC | PRN

## 2024-01-04 NOTE — Progress Notes (Signed)
 Patient ID: Joseph Harvey, male    DOB: 08-17-1955, 68 y.o.   MRN: [id number]  Chief Complaint  Patient presents with   Follow-up    Asthma f/u - Pt no longer getting dupixent . States nothing has been sent to him. ONO 07/18/23    Referring provider: Arloa Jarvis, NP  HPI:   Joseph Harvey is a 68 y.o. man whom we are seeing in follow-up for dyspnea exertion, asthma.  Most recent cardiology note reviewed.    Returns for routine follow-up.  Interim placed on oxygen.  He brings this today.  He finds it beneficial.  He stopped his Dupixent  sometime ago.  It looks like a couple months ago.  No worsening.  Did adherence to Breztri .  Good adherence to his rescue inhalers.  He is using steroids as needed.  He has a T5 compression fracture likely related to steroid use repetitive steroid use time I counseled him against use of steroids but at this point I still think seem to help his breathing.  I sent a message to his PCP regarding osteoporosis workup.  HPI at initial visit: Patient formerly seen by Dr. Darlean last in 2016.  At last visit symptoms are well controlled with Symbicort .  Unclear exactly what occurred in the last 5 years.  But over the last several months he endorses worsening dyspnea on exertion.  Just walking around going to the gym he has become short of breath.  Endorses severe shortness of breath.  This is resolved within a minute or 2 of albuterol  administration.  In general his cough is much better than prior.  Suspect he is adhered well to Dr. Chari instructions regarding his airway cough syndrome.  He does feel like he has significant nasal congestion and postnasal drip.  This produces mucus in the back of her throat needs to clear her cough up but feels much different than his prior cough.  Scheduled to have sinus surgery but this was discontinued due to wheezing per his report.  His insurance is changed and now needs to find a new ENT doctor.  When he gets bad bouts of that mucus  buildup he will take a dose of prednisone  and within a minute, instantly the mucus feels better.  In terms of his dyspnea, rest improves his dyspnea.  There is no other aggravating or alleviating factors.  He has been on Breo for the last several months and says he thinks it helps somewhat with his breathing but is not at the level it was when he was followed by pulmonary prior.  Prior PFTs reviewed and interpreted as suggestive of mild restriction on spirometry, no fixed obstruction, lung volumes revealed TLC of 86% predicted, within normal limits or mildly reduced.  DLCO within normal limits.  Most recent chest x-ray 06/2018 reviewed instructed is clear lungs, current hyperinflation on PA film does not appear hyperinflated on lateral film.  PMH: Anxiety, obesity, diabetes, tobacco abuse, prostate cancer Surgical history: Lumbar back surgery Family History: Father with colon and lung cancer, mother with lung cancer Social history: Grew up in [place], lived there for 35 years, current smoker, 20+ pack year history   Questionaires / Pulmonary Flowsheets:   ACT:  Asthma Control Test ACT Total Score  01/04/2024  9:22 AM 13  04/18/2022  8:32 AM 17  03/10/2021  9:51 AM 13    MMRC:     No data to display          Epworth:  No data to display          Tests:   FENO:  No results found for: NITRICOXIDE  PFT:    Latest Ref Rng & Units 05/26/2021    8:14 AM 02/28/2020   10:53 AM  PFT Results  FVC-Pre L 2.31  2.06   FVC-Predicted Pre % 59  52   FVC-Post L 2.28  2.30   FVC-Predicted Post % 59  59   Pre FEV1/FVC % % 73  70   Post FEV1/FCV % % 72  74   FEV1-Pre L 1.69  1.43   FEV1-Predicted Pre % 56  47   FEV1-Post L 1.65  1.70   DLCO uncorrected ml/min/mmHg 19.07  21.37   DLCO UNC% % 71  80   DLCO corrected ml/min/mmHg 19.07  22.15   DLCO COR %Predicted % 71  82   DLVA Predicted % 99  123   Personally reviewed and interpreted as mixed restrictive and obstructive  physiology with gas trapping, normal DLCO, repeat in 05/2021 showed no longer bronchodilator response likely consistent with better controlled asthma, spirometry overall stable  WALK:     01/04/2024   10:27 AM 06/27/2023    4:01 PM  SIX MIN WALK  Supplimental Oxygen during Test? (L/min) No No  Type  Continuous  Tech Comments: Pt walked at a medium pace, had no complaints of SOB. Patient walked half a lap and O2 dropped to 83 % was placed on 3L O2 went back up to 97%    Imaging: Reviewed as per EMR  Lab Results: Personally reviewed, no significant elevation of eosinophils, IgE and RAST panel negative in past CBC    Component Value Date/Time   WBC 5.4 09/11/2023 0330   RBC 3.57 (L) 09/11/2023 0330   HGB 11.3 (L) 09/11/2023 0330   HGB 12.7 (L) 06/02/2023 0948   HCT 34.8 (L) 09/11/2023 0330   HCT 37.0 (L) 06/02/2023 0948   PLT 300 09/11/2023 0330   PLT 293 06/02/2023 0948   MCV 97.5 09/11/2023 0330   MCV 93 06/02/2023 0948   MCH 31.7 09/11/2023 0330   MCHC 32.5 09/11/2023 0330   RDW 13.2 09/11/2023 0330   RDW 12.9 06/02/2023 0948   LYMPHSABS 1.5 09/06/2023 2307   MONOABS 0.6 09/06/2023 2307   EOSABS 0.0 09/06/2023 2307   BASOSABS 0.0 09/06/2023 2307    BMET    Component Value Date/Time   NA 139 11/28/2023 1336   K 4.4 11/28/2023 1336   CL 96 11/28/2023 1336   CO2 23 11/28/2023 1336   GLUCOSE 200 (H) 11/28/2023 1336   GLUCOSE 175 (H) 09/20/2023 0947   BUN 21 11/28/2023 1336   CREATININE 1.28 (H) 11/28/2023 1336   CALCIUM  9.6 11/28/2023 1336   GFRNONAA 42 (L) 09/20/2023 0947   GFRAA 68 04/14/2020 1036    BNP    Component Value Date/Time   BNP 45.2 09/20/2023 0947    ProBNP    Component Value Date/Time   PROBNP 524.0 (H) 09/06/2023 2307   PROBNP 336 (H) 09/16/2021 1422       Allergies  Allergen Reactions   Lisinopril Shortness Of Breath   Molds & Smuts Anaphylaxis   Dust Mite Extract     Immunization History  Administered Date(s) Administered    DTaP 03/16/2012   Fluzone Influenza virus vaccine,trivalent (IIV3), split virus 04/15/2012   Influenza Split 02/07/2013, 02/14/2015, 01/21/2016   Influenza Whole 04/15/2012, 02/10/2020   Influenza, High Dose Seasonal PF 01/28/2019, 12/23/2021  Influenza, Seasonal, Injecte, Preservative Fre 02/07/2013, 02/14/2015, 01/21/2016, 01/27/2017   Influenza,inj,Quad PF,6+ Mos 01/28/2019   Influenza,inj,Quad PF,6-35 Mos 02/08/2021   Influenza,trivalent, recombinat, inj, PF 02/07/2013, 02/14/2015   Influenza-Unspecified 02/07/2013, 02/07/2013, 02/14/2015, 02/14/2015, 01/21/2016, 01/21/2016, 12/22/2016, 12/22/2016, 01/27/2017, 01/27/2017   PFIZER(Purple Top)SARS-COV-2 Vaccination 07/29/2019, 08/19/2019, 01/07/2020, 03/17/2021   PNEUMOCOCCAL CONJUGATE-20 12/08/2021   Pneumococcal Conjugate-13 03/16/2015   Pneumococcal Polysaccharide-23 03/16/2012, 05/16/2013   Tdap 12/19/2016   Zoster Recombinant(Shingrix) 12/19/2016, 02/27/2017    Past Medical History:  Diagnosis Date   Allergy     Dust, mold, dust mites   Anemia    Anxiety    Asthma    Cancer (HCC)    prostate   Cataract    bilateral repair.   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   Pneumonia    Stress incontinence     Tobacco History: Social History   Tobacco Use  Smoking Status Every Day   Current packs/day: 0.75   Average packs/day: 0.8 packs/day for 49.6 years (37.2 ttl pk-yrs)   Types: Cigarettes   Start date: 1976  Smokeless Tobacco Never  Tobacco Comments   still smoking 0.5 ppd   Ready to quit: Not Answered Counseling given: Not Answered Tobacco comments: still smoking 0.5 ppd      Outpatient Encounter Medications as of 01/04/2024  Medication Sig   acetaminophen  (TYLENOL ) 500 MG tablet Take 500-1,000  mg by mouth every 6 (six) hours as needed for mild pain (pain score 1-3) or moderate pain (pain score 4-6).   albuterol  (PROVENTIL ) (2.5 MG/3ML) 0.083% nebulizer solution TAKE 3 MLS BY NEBULIZATION EVERY 4 HOURS AS NEEDED FOR WHEEZING OR SHORTNESS OF BREATH (((PLAN B))) (Patient taking differently: Take 3 mLs by nebulization every 4 (four) hours as needed for wheezing or shortness of breath.)   albuterol  (VENTOLIN  HFA) 108 (90 Base) MCG/ACT inhaler Inhale 2 puffs into the lungs every 4 hours as needed for shortness of breath or wheezing   ASPIRIN  81 PO Take 81 mg by mouth daily.   Budeson-Glycopyrrol-Formoterol  (BREZTRI  AEROSPHERE) 160-9-4.8 MCG/ACT AERO Inhale 2 puffs into the lungs in the morning and at bedtime.   busPIRone  (BUSPAR ) 10 MG tablet Take 1 tablet (10 mg total) by mouth as needed (anxiety). (Patient taking differently: Take 15 mg by mouth as needed (anxiety).)   Cholecalciferol  (VITAMIN D PO) Take 1 tablet by mouth daily.   clotrimazole-betamethasone (LOTRISONE) cream Apply 1 Application topically daily as needed.   cyclobenzaprine  (FLEXERIL ) 10 MG tablet Take 1 tablet (10 mg total) by mouth 3 (three) times daily as needed for muscle spasms.   dextromethorphan -guaiFENesin  (MUCINEX  DM) 30-600 MG 12hr tablet Take 1 tablet by mouth 2 (two) times daily. (Patient taking differently: Take 1 tablet by mouth as needed for cough.)   doxycycline  (MONODOX ) 100 MG capsule Take 100 mg by mouth 2 (two) times daily as needed.   empagliflozin  (JARDIANCE ) 10 MG TABS tablet Take 1 tablet (10 mg total) by mouth daily.   ezetimibe  (ZETIA ) 10 MG tablet Take 1 tablet (10 mg total) by mouth every evening.   gabapentin  (NEURONTIN ) 300 MG capsule Take 1 capsule (300 mg total) by mouth daily as needed (Nerve pain).   glucose blood (TRUE METRIX BLOOD GLUCOSE TEST) test strip Use as instructed  ipratropium (ATROVENT ) 0.06 % nasal spray Place 2 sprays into both nostrils 3 (three) times daily. As needed for nasal  congestion, runny nose   ipratropium-albuterol  (DUONEB) 0.5-2.5 (3) MG/3ML SOLN INHALE 1 VIAL VIA NEBULIZER TWICE A DAY   LINZESS  145 MCG CAPS capsule Take 145 mcg by mouth daily.   MAGnesium -Oxide 400 (240 Mg) MG tablet Take 1 tablet (400 mg total) by mouth daily.   mavacamten  (CAMZYOS ) 5 MG CAPS capsule Take 1 capsule (5 mg total) by mouth daily.   mavacamten  (CAMZYOS ) 5 MG CAPS capsule Take by mouth daily.   metFORMIN  (GLUCOPHAGE -XR) 500 MG 24 hr tablet Take 2 tablets (1,000 mg total) by mouth in the morning AND 1 tablet (500 mg total) every evening with meals.   metoprolol  succinate (TOPROL -XL) 100 MG 24 hr tablet Take 2 tablets (200 mg total) by mouth daily. Take with or immediately following a meal.   montelukast  (SINGULAIR ) 10 MG tablet Take 1 tablet (10 mg total) by mouth at bedtime as needed (allergies).   nicotine  (NICODERM CQ  - DOSED IN MG/24 HR) 7 mg/24hr patch Place 1 patch (7 mg total) onto the skin daily.   OXYGEN Inhale 3 L into the lungs See admin instructions. Inhale 3L when needed unless overexertion, then use continuously.   potassium chloride  SA (KLOR-CON  M) 20 MEQ tablet Take 2 tablets (40 mEq total) by mouth daily.   rosuvastatin  (CRESTOR ) 20 MG tablet Take 1 tablet (20 mg total) by mouth daily.   silver  sulfADIAZINE  (SILVADENE ) 1 % cream Apply 1 Application topically as needed (rash).   torsemide  (DEMADEX ) 20 MG tablet Take 2 tablets (40 mg total) by mouth daily.   traMADol  (ULTRAM ) 50 MG tablet Take 1 tablet (50 mg total) by mouth every evening. Each prescription to last 1 mth (Patient taking differently: Take 50 mg by mouth every evening.)   triamcinolone  (KENALOG ) 0.025 % cream Apply 1 Application topically daily.   TRUEplus Lancets 28G MISC Use as directed   valsartan  (DIOVAN ) 160 MG tablet Take 1 tablet (160 mg total) by mouth daily.   [DISCONTINUED] predniSONE  (DELTASONE ) 10 MG tablet Take 30 mg by mouth daily as needed.   calcitonin, salmon, (MIACALCIN /FORTICAL) 200  UNIT/ACT nasal spray Place 1 spray into alternate nostrils daily. (Patient not taking: Reported on 01/04/2024)   famotidine  (PEPCID ) 20 MG tablet Take 20-40 mg by mouth daily as needed for heartburn or indigestion. (Patient not taking: Reported on 01/04/2024)   predniSONE  (DELTASONE ) 10 MG tablet Take 3 tablets (30 mg total) by mouth daily as needed.   [DISCONTINUED] Dupilumab  (DUPIXENT ) 300 MG/2ML SOAJ Inject 300 mg into the skin every 14 (fourteen) days. **loading dose completed in clinic on 12/15/22** (Patient not taking: Reported on 01/04/2024)   [DISCONTINUED] verapamil  (CALAN -SR) 120 MG CR tablet Take 1 tablet (120 mg total) by mouth at bedtime.   No facility-administered encounter medications on file as of 01/04/2024.     Review of Systems  Review of Systems  N/a Physical Exam  BP 113/87   Pulse 99   Temp 97.6 F (36.4 C)   Ht 5' 9 (1.753 m)   Wt 249 lb (112.9 kg)   SpO2 97% Comment: RA  BMI 36.77 kg/m   Wt Readings from Last 5 Encounters:  01/04/24 249 lb (112.9 kg)  11/28/23 249 lb (112.9 kg)  09/21/23 238 lb 12.8 oz (108.3 kg)  09/20/23 241 lb 3.2 oz (109.4 kg)  09/14/23 245 lb 6 oz (111.3 kg)    BMI Readings  from Last 5 Encounters:  01/04/24 36.77 kg/m  11/28/23 36.77 kg/m  09/21/23 35.26 kg/m  09/20/23 35.62 kg/m  09/14/23 36.24 kg/m     Physical Exam General: Well-appearing, sitting up in exam chair Eyes: EOMI, icterus Neck: No JVP appreciated, neck supple, Respiratory: clear, no wheeze, NWOB  cardiovascular: Regular rate, regular rhythm, no murmurs Abdomen: Nondistended, bowel sounds present MSK: No joint effusion, no synovitis Neuro: Normal gait, no weakness Psych: Normal mood, full affect    Assessment & Plan:   Dyspnea on exertion: Suspect multifactorial.  Asthma as a contributor.   Further discussion as below regarding asthma.  Other contributors include suspected anxiety/hyperventilation syndrome, obesity, deconditioning, and volume  overload.  Improvement in symptoms, less rescue inhaler use, nebulizer use with increasing diuretic therapy in the past.  Asthma with ongoing exacerbation due to influenza: He has atopic symptoms.  Improved with triple inhaled therapy in the past.  Intermittently has access appropriate triple inhaled therapies but intermittently too expensive, loses access.  Continue Breztri .  Has been off Dupixent  for couple months with no worsening symptoms.  Unclear how much it helped.  Okay to discontinue this.  Continue rescue medications.  On maximal therapies and no significant benefit with Biologics.  Prednisone  as needed prescribed.  T5 compression fracture: Message PCP regarding results and requested osteoporosis workup and treatment as indicated.  Tobacco abuse: Continue lung cancer screening.  Hypoxemic respiratory failure: Ambulated around today and dropped to 83% on room air with ambulation, required 3 L nasal cannula to maintain adequate oxygen saturations, 95% on 3 L.     Return in about 6 months (around 07/06/2024) for f/u Dr. Annella.   Donnice JONELLE Annella, MD 01/04/2024

## 2024-01-04 NOTE — Patient Instructions (Signed)
 Nice to see you again  I refilled prednisone  to be used as needed  Okay to stop Dupixent   No change in the medication otherwise  Return to clinic in 6 months or sooner as needed with Dr. Annella

## 2024-01-23 ENCOUNTER — Ambulatory Visit (HOSPITAL_COMMUNITY)
Admission: RE | Admit: 2024-01-23 | Discharge: 2024-01-23 | Disposition: A | Discharge status: Home / Self Care | Source: Ambulatory Visit | Attending: Internal Medicine

## 2024-01-23 ENCOUNTER — Other Ambulatory Visit: Payer: Self-pay | Admitting: Internal Medicine

## 2024-01-23 DIAGNOSIS — I421 Obstructive hypertrophic cardiomyopathy: Secondary | ICD-10-CM

## 2024-01-23 DIAGNOSIS — N179 Acute kidney failure, unspecified: Secondary | ICD-10-CM

## 2024-01-23 DIAGNOSIS — I493 Ventricular premature depolarization: Secondary | ICD-10-CM

## 2024-01-23 DIAGNOSIS — I5033 Acute on chronic diastolic (congestive) heart failure: Secondary | ICD-10-CM

## 2024-01-23 DIAGNOSIS — I2510 Atherosclerotic heart disease of native coronary artery without angina pectoris: Secondary | ICD-10-CM

## 2024-01-24 ENCOUNTER — Telehealth: Payer: Self-pay | Admitting: Pulmonary Disease

## 2024-01-24 NOTE — Telephone Encounter (Signed)
 Copied from CRM [id number]. Topic: Clinical - Order For Equipment >> Jan 24, 2024  1:22 PM Leila C wrote: Reason for CRM: Patient [phone] states oxygen DME is being changed to [employer], and [employer] will fax the order request to the office. Please send a message via Mychart once this is done.

## 2024-01-26 ENCOUNTER — Encounter: Payer: Self-pay | Admitting: Pulmonary Disease

## 2024-01-26 ENCOUNTER — Other Ambulatory Visit: Payer: Self-pay

## 2024-01-26 DIAGNOSIS — J4551 Severe persistent asthma with (acute) exacerbation: Secondary | ICD-10-CM

## 2024-01-28 ENCOUNTER — Ambulatory Visit: Payer: Self-pay | Admitting: Internal Medicine

## 2024-01-28 LAB — ECHOCARDIOGRAM SQUAT TO STAND: S' Lateral: 2.6 cm

## 2024-01-29 ENCOUNTER — Telehealth: Payer: Self-pay | Admitting: Internal Medicine

## 2024-01-29 MED ORDER — CAMZYOS 5 MG PO CAPS: 5.0000 mg | ORAL_CAPSULE | Freq: Every day | ORAL | 0 refills | Status: DC

## 2024-01-29 NOTE — Telephone Encounter (Signed)
 Please see Echocardiogram result note for more details.

## 2024-01-29 NOTE — Telephone Encounter (Signed)
 Pharmacy would like a copy of the echo sent to [facility] portal in order for the patient to continue receiving his CAMZYOS 

## 2024-01-29 NOTE — Telephone Encounter (Signed)
 Pt reports PCP started him on Ozempic  last week.  Pt reports discussed starting Ozempic  with specialty pharmacy prior to starting.  Will start taking an OTC multivitamin daily.   Denies new HF symptoms.  [facility] portal updated and refill sent to specialty pharmacy on file.  Pt concerned that HR stays above 100 for most of the day does go down to  70's at times.  Denies feeling racing heart rate or palpitations.  Continues to take metoprolol  200 mg daily.  Advised will send to MD to see if any changes are needed.

## 2024-01-31 ENCOUNTER — Other Ambulatory Visit: Payer: Self-pay | Admitting: Internal Medicine

## 2024-02-03 ENCOUNTER — Encounter: Payer: Self-pay | Admitting: Internal Medicine

## 2024-02-03 ENCOUNTER — Other Ambulatory Visit: Payer: Self-pay | Admitting: Pulmonary Disease

## 2024-02-05 ENCOUNTER — Telehealth: Payer: Self-pay

## 2024-02-05 ENCOUNTER — Other Ambulatory Visit: Payer: Self-pay | Admitting: Internal Medicine

## 2024-02-05 DIAGNOSIS — J4540 Moderate persistent asthma, uncomplicated: Secondary | ICD-10-CM

## 2024-02-05 MED ORDER — PREDNISONE 10 MG PO TABS: 30.0000 mg | ORAL_TABLET | Freq: Every day | ORAL | 1 refills | Status: DC | PRN

## 2024-02-05 MED ORDER — METOPROLOL SUCCINATE ER 100 MG PO TB24: 200.0000 mg | ORAL_TABLET | Freq: Every day | ORAL | 3 refills | Status: DC

## 2024-02-05 NOTE — Telephone Encounter (Signed)
 *  STAT* If patient is at the pharmacy, call can be transferred to refill team.   1. Which medications need to be refilled? (please list name of each medication and dose if known)   metoprolol  succinate (TOPROL -XL) 100 MG 24 hr tablet  nicotine  (NICODERM CQ  - DOSED IN MG/24 HR) 7 mg/24hr patch   2. Which pharmacy/location (including street and city if local pharmacy) is medication to be sent to?  [id number] - [place], [place] - [address]    3. Do they need a 30 day or 90 day supply? 90 days

## 2024-02-05 NOTE — Telephone Encounter (Signed)
 Lm x1 for patient.

## 2024-02-05 NOTE — Telephone Encounter (Signed)
 Copied from CRM [id number]. Topic: Clinical - Order For Equipment >> Jan 24, 2024  1:22 PM Leila C wrote: Reason for CRM: Patient [id number] states oxygen DME is being changed to [employer], and [employer] will fax the order request to the office. Please send a message via Mychart once this is done. >> Feb 02, 2024  3:54 PM Leila BROCKS wrote: Joni from [employer] [phone] would like to let Burnard know, they received chart notes but not adequate oxygen saturation notes and needs to discuss with Burnard on this issues for patient to be qualified for an oxygen concentrator. Informed Joni, the office is closed and will call on Monday.   Awaiting Dr. Sherry response

## 2024-02-05 NOTE — Telephone Encounter (Addendum)
 Copied from CRM #[id number]. Topic: General - Call Back - No Documentation >> Feb 05, 2024  3:42 PM Tysheama G wrote: Reason for CRM: Patient was returning call.   Spoke w/ patient about scheduling a 6 minute walk, someone  will reach out to schedule    And LM w/ Joni of [employer] and explained that there was two different notes on his O2 walk that day and we will be doing a 6 minute walk.

## 2024-02-05 NOTE — Telephone Encounter (Signed)
 Copied from CRM #[id number]. Topic: General - Other >> Feb 01, 2024  1:28 PM Corean R wrote: Reason for CRM: Joanie Bailey from [facility] states the patiens testing does not qualify him and the additional sheet that was faxed to [employer] on 9/12 with oxygen levels do not drop low enough.  Dedra states she will need last 12 months of vitals where patient is at 88% or lower. A true 6 min walk test that qualifies.  These are needed in order to qualify patient and for insurance to pay.  For questions please call direct line at [phone] (secured line)    Lov note walk test is below showing pts sats did not drop. Ambulatory Pulse Oximetry  Row Name 01/04/24 1027      Resting  Supplemental oxygen during test? No      Resting Heart Rate 101      Resting Sp02 97      Lap 1 (250 feet)  HR 108      02 Sat 97      Lap 2 (250 feet)  HR 112      02 Sat 96      Lap 3 (250 feet)  HR 114      02 Sat 98      Tech Comments: Pt walked at a medium pace, had no complaints of SOB.      Dr. Annella can you please advise how many liters and if pt is to be continuing oxygen. If so would you like pt to have 6 minute walk test?

## 2024-02-05 NOTE — Telephone Encounter (Signed)
 Called and spoke with the patient. Pt has been scheduled 6 minute walk for 9/24.  Order has also been placed.

## 2024-02-05 NOTE — Telephone Encounter (Addendum)
 Pt is aware of below message and voiced his understanding. He stated that the pharmacy does not have a refill on file. He is aware that this medication should be taken as needed.   I have spoken to [place] with [facility]. She stated that a Rx was picked up 01/25/2024. She stated that she does not a Rx on file from 01/04/2024.  Dr. Annella mason advise. Thanks

## 2024-02-05 NOTE — Addendum Note (Signed)
 Addended byBETHA ANNELLA COUGH on: 02/05/2024 02:02 PM   Modules accepted: Orders

## 2024-02-05 NOTE — Telephone Encounter (Signed)
Lm x1 for pt.

## 2024-02-05 NOTE — Telephone Encounter (Signed)
 Pt is requesting a refill on non cardiac medication nicotine  patch. Would Dr. Santo like to refill this non cardiac medication? Please address

## 2024-02-05 NOTE — Telephone Encounter (Signed)
 Routing to [place] to schedule and place order for 6 minute walk test.

## 2024-02-05 NOTE — Telephone Encounter (Signed)
 Was [facility] to [facility] [address]. I sent it again just now to same place.

## 2024-02-05 NOTE — Telephone Encounter (Signed)
 I would recommend a true 6 minute walk - if he does not qualify of O2 based on that then nothing further we can do. Thanks!

## 2024-02-05 NOTE — Telephone Encounter (Signed)
 I provided 30 days and 1 refill in August. He should not need refills at the earliest until October. And he should be using as needed so should last longer than that.

## 2024-02-05 NOTE — Telephone Encounter (Signed)
Dr. Hunsucker, please advise. Thanks 

## 2024-02-06 MED ORDER — NICOTINE 7 MG/24HR TD PT24: 7.0000 mg | MEDICATED_PATCH | Freq: Every day | TRANSDERMAL | 0 refills | Status: DC

## 2024-02-07 ENCOUNTER — Encounter: Admitting: Pulmonary Disease

## 2024-02-07 DIAGNOSIS — J4550 Severe persistent asthma, uncomplicated: Secondary | ICD-10-CM

## 2024-02-07 NOTE — Research (Unsigned)
 Six Minute Walk - 02/07/24 1008       Six Minute Walk   Medications taken before test (dose and time) Asprin, Breztri , Calcitonin, EmpagliflozinMagnesium oxide, Mavacamten , Metformin , Metoprolol  Succinate, Potassium ChlorideValsartan, Vitamin D    Supplemental oxygen during test? No    Lap distance in meters  34 meters    Laps Completed 19    Partial lap (in meters) 0 meters    Baseline BP (sitting) 130/86    Baseline Heartrate 131    Baseline Dyspnea (Borg Scale) 2    Baseline Fatigue (Borg Scale) 5    Baseline SPO2 94 %   RA     End of Test Values    BP (sitting) 132/80    Heartrate 134    Dyspnea (Borg Scale) 2    Fatigue (Borg Scale) 5    SPO2 95 %   RA     2 Minutes Post Walk Values   BP (sitting) 140/84    Heartrate 116    SPO2 97 %   RA   Stopped or paused before six minutes? No    Other Symptoms at end of exercise: Hip pain;Calf pain      Interpretation   Distance completed 646 meters    Tech Comments: Patient walked at a slow pace, with finger pulse ox attached during entire walk.  He did not have to stop for any fatigue, sob or pain.  patient continued to walk to see if sats would drop with additional walking, sats remained 94-95% on RA.  He has steps to his home so we went up 1 flight of stairs 3 times, sats dropped to 88% while walking another lap around the office.  Placed on 3L of pulsed oxygen, sats up to 97%.  HR was 143.  See pulmonary tab.  Qualified for oxygen.

## 2024-02-08 ENCOUNTER — Telehealth: Payer: Self-pay | Admitting: Pulmonary Disease

## 2024-02-08 MED ORDER — METOPROLOL SUCCINATE ER 200 MG PO TB24: 200.0000 mg | ORAL_TABLET | Freq: Every day | ORAL | 3 refills | Status: AC

## 2024-02-08 NOTE — Telephone Encounter (Signed)
 Pt is aware and voiced his understanding.  Nothing further needed.

## 2024-02-08 NOTE — Telephone Encounter (Signed)
 Paperwork has been faxed  -NFN

## 2024-02-08 NOTE — Telephone Encounter (Signed)
 Copied from CRM #[id number]. Topic: Medical Record Request - Records Request >> Feb 07, 2024  3:27 PM Isabell A wrote: Reason for CRM: Jonie Bailey from [employer] is just calling to confirm [facility] will send patients progress notes from todays visit.  Fax number: [phone]  Callback number: [phone]

## 2024-02-09 NOTE — Telephone Encounter (Signed)
 Patient is calling back because he spoke with the [employer] company and they say they received the fax but they didn't receive the signed prescription . Please fax signed prescription to [employer]  [phone] just in case needing to reach patient

## 2024-02-09 NOTE — Telephone Encounter (Signed)
 Joni bailey from [employer] says she faxed over  standard written order that she needs the doctor to date and sign in ink with ink pen . Just wanted to let kelly know about the paperwork that was faxed over so we can get that back and move forward

## 2024-02-09 NOTE — Telephone Encounter (Signed)
 Patient says the [employer] lady name elise [phone] just in case needing to speak with [employer] concerning the prescriiption that was not sent over with the fax that was sent yesterday

## 2024-02-13 ENCOUNTER — Telehealth: Payer: Self-pay

## 2024-02-13 NOTE — Telephone Encounter (Signed)
 Copied from CRM [id number]. Topic: General - Other >> Feb 12, 2024  2:19 PM Corean SAUNDERS wrote: Reason for CRM: Joanie Bailey with [employer] is requesting a call back from [place] regarding a standard written order that [facility] faxed to the [facility]monary clinic. The document needs to be signed, dated, and faxed back to [employer] and Joanie would like to know when to expect this back.  Joanie can be reached at  [phone] >> Feb 13, 2024 10:51 AM Russell PARAS wrote: Pt is contacting clinic regarding request for new machine through [employer]. [facility] has advised him they are needing a new prescription sent over for him to receive.   Requested call back  CB#  [phone]   VM / LM that Dr.hunsucker is not in clinic at this time and will signed when he returns to clinic   -NFN

## 2024-02-13 NOTE — Telephone Encounter (Signed)
 Copied from CRM [id number]. Topic: General - Other >> Feb 12, 2024  2:19 PM Corean SAUNDERS wrote: Reason for CRM: Joanie Bailey with [employer] is requesting a call back from [place] regarding a standard written order that [facility] faxed to the [facility] clinic. The document needs to be signed, dated, and faxed back to [employer] and Joanie would like to know when to expect this back.  Joanie can be reached at  [phone]   VM/ LM  dr. Annella not in clinic and will sign when he returns    -NFN

## 2024-02-15 ENCOUNTER — Other Ambulatory Visit (HOSPITAL_COMMUNITY)

## 2024-02-15 NOTE — Telephone Encounter (Signed)
 Order has been placed.

## 2024-02-19 ENCOUNTER — Telehealth: Payer: Self-pay

## 2024-02-19 NOTE — Telephone Encounter (Signed)
 Copied from CRM [id number]. Topic: General - Other >> Feb 19, 2024 12:41 PM Rozanna MATSU wrote: Lauraine with [employer] calling inregards to the above request contact person  Joanie can be reached at  [phone]. Just wanting if provider back in office to sign order     Stated in previous phone note provider was unable to sign due to not being in clinic, it will be faxed once he has signed it

## 2024-02-20 ENCOUNTER — Ambulatory Visit (HOSPITAL_COMMUNITY)
Admission: RE | Admit: 2024-02-20 | Discharge: 2024-02-20 | Disposition: A | Discharge status: Home / Self Care | Source: Ambulatory Visit | Attending: Student in an Organized Health Care Education/Training Program | Admitting: Student in an Organized Health Care Education/Training Program

## 2024-02-20 ENCOUNTER — Telehealth: Payer: Self-pay | Admitting: Pulmonary Disease

## 2024-02-20 ENCOUNTER — Telehealth: Payer: Self-pay

## 2024-02-20 ENCOUNTER — Other Ambulatory Visit: Payer: Self-pay | Admitting: Internal Medicine

## 2024-02-20 DIAGNOSIS — I421 Obstructive hypertrophic cardiomyopathy: Secondary | ICD-10-CM

## 2024-02-20 DIAGNOSIS — I5033 Acute on chronic diastolic (congestive) heart failure: Secondary | ICD-10-CM

## 2024-02-20 DIAGNOSIS — N179 Acute kidney failure, unspecified: Secondary | ICD-10-CM

## 2024-02-20 DIAGNOSIS — I2510 Atherosclerotic heart disease of native coronary artery without angina pectoris: Secondary | ICD-10-CM

## 2024-02-20 DIAGNOSIS — I493 Ventricular premature depolarization: Secondary | ICD-10-CM

## 2024-02-20 LAB — ECHOCARDIOGRAM SQUAT TO STAND
Area-P 1/2: 4.59 cm2
S' Lateral: 2.3 cm

## 2024-02-20 NOTE — Telephone Encounter (Signed)
 Paperwork for [employer] CMN has been signed & Faxed to Joni.

## 2024-02-20 NOTE — Telephone Encounter (Signed)
 Copied from CRM [id number]. Topic: General - Other >> Feb 20, 2024 12:09 PM Whitney O wrote: Reason for CRM: [employer] joni con is calling because they sent over a faxed copy standard written order that we requested . but the doctor signed it with sylish pen and then they dated it with a ink pen and the problem is that sytlish signature want be accepted by medicare so we are going to fax over a new copy and we need dr annella to sign with a ink pen and date with a ink pen

## 2024-02-22 ENCOUNTER — Telehealth: Payer: Self-pay | Admitting: Internal Medicine

## 2024-02-22 DIAGNOSIS — I421 Obstructive hypertrophic cardiomyopathy: Secondary | ICD-10-CM

## 2024-02-22 NOTE — Telephone Encounter (Signed)
 Patient notes that he is doing fair.   Since last visit notes NYHA II and III; some days needs no O2 and feels great. Sometimes has bad days. Has back pain and sometimes needs his O2. There are no interval hospital/ED visit.   Echo showed 65%.  LVOT gradient 20 mg.  No chest pain or pressure.  No SOB at rest; but DOE with mild activity and no PND/Orthopnea.  No weight gain or leg swelling.  No palpitations or syncope.  No change in medication.  We will continue of mavacamten  5 mg as per protocol. At week 12, perform a stress echo for repeat gradient assessment (currently listed as squat to stand); consented.  Patient has recorded the majority or our phone call for reference.  Stanly Leavens, MD FASE [facility] Cardiologist [facility]  [address] [place], [place] [address] [phone]  3:42 PM

## 2024-02-23 ENCOUNTER — Telehealth: Payer: Self-pay

## 2024-02-23 ENCOUNTER — Encounter: Payer: Self-pay | Admitting: Internal Medicine

## 2024-02-23 MED ORDER — CAMZYOS 5 MG PO CAPS: 5.0000 mg | ORAL_CAPSULE | Freq: Every day | ORAL | 0 refills | Status: DC

## 2024-02-23 NOTE — Telephone Encounter (Signed)
 REM portal updated. Patient's next echo is on 03/19/24 before his office visit the next day.

## 2024-02-23 NOTE — Telephone Encounter (Signed)
 Copied from CRM [id number]. Topic: General - Other >> Feb 21, 2024 12:56 PM Rilla B wrote: Reason for CRM: Joni Bailey from [employer] calling to see if the fax they sent over for Dr Annella to sign in ink and date was received. Please call Joni [phone] >> Feb 23, 2024  9:12 AM Celestine FALCON wrote: Joni from [employer] is calling again regarding the order needing to be signed and faxed back for a standard written order for Oxygen. She stated previously it was signed with a stylist on 10/7 with ink dated, but it all needs to be ink instead so it would be accepted by Medicare. This is for a [id number] and [id number] POC. Please have Dr. Annella sign and date with ink and fax back to [phone].    Will have provider sign the new form when in Clinic on 02/26/24

## 2024-02-26 ENCOUNTER — Telehealth: Payer: Self-pay

## 2024-02-26 NOTE — Telephone Encounter (Addendum)
 Copied from CRM [id number]. Topic: General - Other >> Feb 21, 2024 12:56 PM Rilla B wrote: Reason for CRM: Joni Bailey from [employer] calling to see if the fax they sent over for Dr Annella to sign in ink and date was received. Please call Joni [phone] >> Feb 26, 2024 12:39 PM Russell PARAS wrote: Pt is contacting clinic regarding order for oxygen sent to [employer]. He is very frustrated and would like a status update on this order  CB#  [phone] >> Feb 23, 2024  9:12 AM Celestine FALCON wrote: Joni from [employer] is calling again regarding the order needing to be signed and faxed back for a standard written order for Oxygen. She stated previously it was signed with a stylist on 10/7 with ink dated, but it all needs to be ink instead so it would be accepted by Medicare. This is for a [id number] and [id number] POC. Please have Dr. Annella sign and date with ink and fax back to [phone].   Tried to reach out to Joni of [employer] re faxed paperwork ink signed

## 2024-02-27 NOTE — Telephone Encounter (Signed)
 Copied from CRM [id number]. Topic: General - Other >> Feb 20, 2024 12:09 PM Whitney O wrote: Reason for CRM: [employer] joni con is calling because they sent over a faxed copy standard written order that we requested . but the doctor signed it with sylish pen and then they dated it with a ink pen and the problem is that sytlish signature want be accepted by medicare so we are going to fax over a new copy and we need dr annella to sign with a ink pen and date with a ink pen    I called and spoke with patient, he states [employer] has the order.  He is now trying to get [employer] to accept his signature digitally.  [employer] has everything they need from us  now.  Nothing further needed.

## 2024-02-27 NOTE — Telephone Encounter (Signed)
 Placed an order for stress echocardiogram per MD.

## 2024-02-27 NOTE — Addendum Note (Signed)
 Addended by: RANDY HAMP SAILOR on: 02/27/2024 09:40 AM   Modules accepted: Orders

## 2024-02-27 NOTE — Telephone Encounter (Signed)
 Duplicate, see message from 10/13.  Nothing further needed.

## 2024-02-28 NOTE — Telephone Encounter (Signed)
 OV rescheduled on 02/27/24 new date is 04/03/24 at 11:40 am.

## 2024-02-29 ENCOUNTER — Telehealth: Payer: Self-pay

## 2024-02-29 ENCOUNTER — Other Ambulatory Visit (HOSPITAL_BASED_OUTPATIENT_CLINIC_OR_DEPARTMENT_OTHER): Payer: Self-pay | Admitting: Nurse Practitioner

## 2024-02-29 DIAGNOSIS — Z13820 Encounter for screening for osteoporosis: Secondary | ICD-10-CM

## 2024-02-29 NOTE — Telephone Encounter (Signed)
 Copied from CRM #[id number]. Topic: Clinical - Order For Equipment >> Feb 29, 2024  1:50 PM Corean SAUNDERS wrote: Reason for CRM: Patient is requesting Dr. Annella to please send a discontinuation of use for his CPAP machine to [facility] so that they will pick up his CPAP machine.  Dr Annella, is this appropriate to do?

## 2024-03-04 ENCOUNTER — Telehealth: Payer: Self-pay | Admitting: *Deleted

## 2024-03-04 ENCOUNTER — Other Ambulatory Visit: Payer: Self-pay | Admitting: Pulmonary Disease

## 2024-03-04 DIAGNOSIS — J4550 Severe persistent asthma, uncomplicated: Secondary | ICD-10-CM

## 2024-03-04 MED ORDER — PREDNISONE 10 MG PO TABS: 30.0000 mg | ORAL_TABLET | Freq: Every day | ORAL | 3 refills | Status: DC | PRN

## 2024-03-04 NOTE — Telephone Encounter (Signed)
 Called patient,said everything has been figured,will contact us  if he needs anything.NFN

## 2024-03-04 NOTE — Telephone Encounter (Signed)
 Prednisone sent

## 2024-03-04 NOTE — Telephone Encounter (Signed)
ATC x1.  LMTCB. 

## 2024-03-04 NOTE — Telephone Encounter (Signed)
 Breathing has been worse last 7 days.  It was hot and then turned cold.  He is having wheezing and sob.  He took doxycycline , finished it after 7 days.  Mucous is either yellow or clear.  Doing saline nasal rinses.  No fever chills or body aches.   Dr. Annella, States his breathing has been worse the past 7 days.  The weather has been a factor.  Patient is requesting prednisone , he had the prior prescription filled on 9/22 and 10/6.  He said it opens him up.  He takes it along with Benadryl .  He recently completed a 7 day course of Doxycycline .  I did not see a recent prescription for Doxycycline  in EPIC (last dose showing 07/02/23).  He is having increase sob and wheezing.  He is using his nebulizer which helps and is doing saline rinses twice daily.  He is requesting prednisone  to open up his airway.  Please advise.  Thank you.

## 2024-03-04 NOTE — Telephone Encounter (Signed)
 Sleep study and OSA care is via cardiology - he should contact them regarding this. Thanks!

## 2024-03-04 NOTE — Telephone Encounter (Signed)
 Copied from CRM #[id number]. Topic: Clinical - Order For Equipment >> Feb 29, 2024  1:50 PM Corean SAUNDERS wrote: Reason for CRM: Patient is requesting Dr. Annella to please send a discontinuation of use for his CPAP machine to [facility] so that they will pick up his CPAP machine. >> Mar 04, 2024  9:55 AM Rozanna G wrote: Pt returning call to clinic. Attempted to advise him of the message from provider, he stated it was some more questions he had  Duplicate. Dr. Annella manages his Asthma only.  Cardiology manages his CPAP.  He will need to contact them for the order.  I called the patient and he clarified that he was calling about the oxygen from [employer], he needs an order for [facility] to pick up the oxygen now that he has his equipment from [employer].  Advised I would send over the order.  He was asking about the prednisone , I advised him that it was sent in on 9/22 with 1 refill.  It was  Filled on 9/22 and 10/6, he has no refills.  Advised I would let the patient know.

## 2024-03-04 NOTE — Addendum Note (Signed)
 Addended byBETHA ANNELLA COUGH on: 03/04/2024 11:49 AM   Modules accepted: Orders

## 2024-03-04 NOTE — Telephone Encounter (Signed)
 Called and spoke with patient, advised patient that Dr. Annella had sent in the prednisone .  He verbalized understanding.  Nothing further needed.

## 2024-03-06 ENCOUNTER — Other Ambulatory Visit (HOSPITAL_COMMUNITY): Payer: Self-pay | Admitting: Physician Assistant

## 2024-03-07 ENCOUNTER — Telehealth: Payer: Self-pay

## 2024-03-07 NOTE — Telephone Encounter (Signed)
 CMN faxed on 02/26/24 [facility]

## 2024-03-14 ENCOUNTER — Telehealth (HOSPITAL_COMMUNITY): Payer: Self-pay | Admitting: *Deleted

## 2024-03-14 ENCOUNTER — Other Ambulatory Visit: Payer: Self-pay | Admitting: Medical Genetics

## 2024-03-14 DIAGNOSIS — Z006 Encounter for examination for normal comparison and control in clinical research program: Secondary | ICD-10-CM

## 2024-03-14 NOTE — Telephone Encounter (Signed)
 Pt given instructions for stress echo.

## 2024-03-15 ENCOUNTER — Other Ambulatory Visit (HOSPITAL_COMMUNITY)

## 2024-03-19 ENCOUNTER — Other Ambulatory Visit (HOSPITAL_COMMUNITY)

## 2024-03-20 ENCOUNTER — Ambulatory Visit: Admitting: Internal Medicine

## 2024-03-21 ENCOUNTER — Ambulatory Visit (HOSPITAL_COMMUNITY)
Admission: RE | Admit: 2024-03-21 | Discharge: 2024-03-21 | Disposition: A | Source: Ambulatory Visit | Attending: Cardiovascular Disease

## 2024-03-21 ENCOUNTER — Other Ambulatory Visit: Payer: Self-pay | Admitting: Internal Medicine

## 2024-03-21 ENCOUNTER — Ambulatory Visit (HOSPITAL_COMMUNITY)
Admission: RE | Admit: 2024-03-21 | Discharge: 2024-03-21 | Disposition: A | Discharge status: Home / Self Care | Source: Ambulatory Visit | Attending: Cardiovascular Disease | Admitting: Cardiovascular Disease

## 2024-03-21 DIAGNOSIS — I421 Obstructive hypertrophic cardiomyopathy: Secondary | ICD-10-CM | POA: Diagnosis present

## 2024-03-24 ENCOUNTER — Ambulatory Visit: Payer: Self-pay | Admitting: Internal Medicine

## 2024-03-24 DIAGNOSIS — I421 Obstructive hypertrophic cardiomyopathy: Secondary | ICD-10-CM

## 2024-03-24 LAB — ECHOCARDIOGRAM SQUAT TO STAND: S' Lateral: 2.6 cm

## 2024-03-25 ENCOUNTER — Encounter: Payer: Self-pay | Admitting: Internal Medicine

## 2024-03-26 ENCOUNTER — Telehealth: Payer: Self-pay | Admitting: Internal Medicine

## 2024-03-26 MED ORDER — MAVACAMTEN 10 MG PO CAPS: 10.0000 mg | ORAL_CAPSULE | Freq: Every day | ORAL | 0 refills | Status: DC

## 2024-03-26 NOTE — Telephone Encounter (Signed)
 Pt c/o medication issue:  1. Name of Medication: mavacamten  (CAMZYOS ) 10 MG CAPS capsule   2. How are you currently taking this medication (dosage and times per day)?   3. Are you having a reaction (difficulty breathing--STAT)? No  4. What is your medication issue? Pt would like a c/b as to when he is to start taking medication. Pt also states that he already has medication in 5 MG. He would like to know if he is able to take 2 tablets of the 5 Mg. Please advise

## 2024-03-26 NOTE — Telephone Encounter (Signed)
 Patient identification verified by 2 forms.   Called and spoke to patient   Patient states:  -He will get the 10 mg tablets Friday. He will continue to take the 5 mg until get gets the 10 mg tablets.   -Right foot swollen (for 5 days)  -2-3 Torsemide  taken on occasion. (Maybe once or twice a month)   Patient denies:  -Taking Torsemide  three times often.               Interventions/Plan: -Pt will follow up with his PCP if his foot continues to swell.   Patient agrees with plan, no questions at this time

## 2024-03-26 NOTE — Telephone Encounter (Signed)
-----   Message from Stanly DELENA Leavens sent at 03/24/2024  2:35 PM EST ----- Results: Peak LVOT Gradient: 28 mm Hg LVEF : 65% Plan: This is patients 12 week echo Per protocol, increase dose to 10 mg PO Daily and repeat echo in 4 then 12 weeks from current date If medication changes will need medication review. I have f/u with him later this month to discuss options.   Stanly DELENA Leavens, MD  ----- Message ----- From: Interface, [employer] Sent: 03/24/2024   1:50 PM EST To: Stanly DELENA Leavens, MD

## 2024-03-26 NOTE — Telephone Encounter (Signed)
 Updated REM portol. Sent in Camzyos  10 mg to patient's speciality pharmacy. Ordered patient's echos.

## 2024-03-29 ENCOUNTER — Ambulatory Visit: Attending: Internal Medicine

## 2024-03-29 ENCOUNTER — Other Ambulatory Visit: Payer: Self-pay | Admitting: Cardiovascular Disease

## 2024-03-29 ENCOUNTER — Telehealth: Payer: Self-pay | Admitting: Internal Medicine

## 2024-03-29 DIAGNOSIS — R000 Tachycardia, unspecified: Secondary | ICD-10-CM

## 2024-03-29 DIAGNOSIS — R079 Chest pain, unspecified: Secondary | ICD-10-CM

## 2024-03-29 NOTE — Telephone Encounter (Signed)
 Clarification from Dr Delford  - patient to take an extra 100 mg Metoprolol  succinate ( 1/2 of regular dosing)   The monitor is to be  a Live 14 day  monitor.  Spoke to patient . Patient verbalized understanding. Patient also wanted to know if Dr Primus is aware. RN informed patient that this message has been sent to Dr C for his review.    Patient stated his heart rate is now in the low 120 's.  RN informed  patient try to relax and do not take pulse or O2 sat continuously  - this will make him anxious and keep heart rate elevated    Instruction was given if symptoms get worse to go to ER for evaluation   Keep appt with Dr Primus on 04/03/24. Monitor will be mailed to him. He verbalized understanding.

## 2024-03-29 NOTE — Progress Notes (Unsigned)
 Enrolled for [employer] to mail a ZIO AT Live Telemetry monitor to patients address on file.   Dr. Emilee to read.

## 2024-03-29 NOTE — Telephone Encounter (Signed)
 Spoke to patient . He states  he increase his does of Camyzos  this morning to 10 mg as directed.  He is calling because he states his heart rate is  up 145- 169 . He  has not checked Blood pressure or  daily  weight.  He also has pain on left side of his chest  top of my muscle , no pain ate the present -while he is sitting. No radiation of pain , no shortness of breath, no diaphoresis as well  He states he took a fluid pill today - He didn't know if  weight ws up.   RN informed patient  will  review with a provider  and pharmacist

## 2024-03-29 NOTE — Telephone Encounter (Signed)
 STAT if HR is under 50 or over 120 (normal HR is 60-100 beats per minute)  What is your heart rate? 150s  Do you have a log of your heart rate readings (document readings)? Just from this morning   Do you have any other symptoms? Slight chest tightness     Pt c/o of Chest Pain: STAT if active CP, including tightness, pressure, jaw pain, radiating pain to shoulder/upper arm/back, CP unrelieved by Nitro. Symptoms reported of SOB, nausea, vomiting, sweating.  1. Are you having CP right now? Yes     2. Are you experiencing any other symptoms (ex. SOB, nausea, vomiting, sweating)? No    3. Is your CP continuous or coming and going? Continuous, worse when up moving    4. Have you taken Nitroglycerin? No    5. How long have you been experiencing CP? About an hour     6. If NO CP at time of call then end call with telling Pt to call back or call 911 if Chest pain returns prior to return call from triage team.   Call transferred.

## 2024-03-29 NOTE — Telephone Encounter (Signed)
  He has known COPD/HOCM. Can take extra lopressor  if needed Get 14 day monitor and can f/u with Dr Santo Delford Maude JAYSON, MD    03/29/24  8:53 AM

## 2024-04-03 ENCOUNTER — Ambulatory Visit: Attending: Internal Medicine | Admitting: Internal Medicine

## 2024-04-03 ENCOUNTER — Encounter: Payer: Self-pay | Admitting: Internal Medicine

## 2024-04-03 VITALS — BP 128/92 | HR 100 | Ht 69.0 in | Wt 249.0 lb

## 2024-04-03 DIAGNOSIS — Z79899 Other long term (current) drug therapy: Secondary | ICD-10-CM

## 2024-04-03 DIAGNOSIS — I5032 Chronic diastolic (congestive) heart failure: Secondary | ICD-10-CM

## 2024-04-03 DIAGNOSIS — E876 Hypokalemia: Secondary | ICD-10-CM

## 2024-04-03 LAB — BASIC METABOLIC PANEL WITH GFR
BUN/Creatinine Ratio: 16 (ref 10–24)
BUN: 22 mg/dL (ref 8–27)
CO2: 27 mmol/L (ref 20–29)
Calcium: 8.8 mg/dL (ref 8.6–10.2)
Chloride: 94 mmol/L — ABNORMAL LOW (ref 96–106)
Creatinine, Ser: 1.36 mg/dL — ABNORMAL HIGH (ref 0.76–1.27)
Glucose: 143 mg/dL — ABNORMAL HIGH (ref 70–99)
Potassium: 3.6 mmol/L (ref 3.5–5.2)
Sodium: 141 mmol/L (ref 134–144)
eGFR: 57 mL/min/1.73 — ABNORMAL LOW (ref >59)

## 2024-04-03 NOTE — Progress Notes (Signed)
 Cardiology Office Note:  .    Date:  04/03/2024  ID:  Joseph Harvey, DOB 1955-06-04, MRN [id number] PCP: Arloa Jarvis, NP  [facility] Providers Cardiologist:  Lonni LITTIE Nanas, MD Electrophysiologist:  Soyla Gladis Norton, MD     CC: Follow up HCM Care  History of Present Illness: .    Joseph Harvey is a 68 y.o. male  with hypertrophic cardiomyopathy who presents with breathing difficulties and back pain.  He experiences breathing difficulties, particularly when lying down, which he attributes to nerve issues in his back. He feels like he 'can't breathe' when lying down and is awaiting a call back from his back doctor regarding this issue. He also experiences shortness of breath when lifting heavy objects or after eating too much. He recently started a new medication at 10 mg and reports that he is not breathing as hard when picking things up.  He describes a recent issue with his foot, where he broke a scab, leading to nerve irritation and swelling. He decided to wait a couple of weeks to see if the swelling was due to fluid retention.  He experienced a significant episode where his heart rate increased to 159-160 bpm, causing concern. He did not take the recommended medication but instead rested, which helped lower his heart rate to 130 bpm. He is currently wearing a heart monitor to track his heart activity.  He is attempting to quit smoking by reducing his cigarette intake to half a cigarette at a time and using nicotine  patches. This method is helping him curb his smoking habit.  He experiences low energy levels and body pain, particularly in his ankles and feet. He takes torsemide  40 mg daily and has been taking more potassium recently. He also takes Linzess , which requires him to have quick access to a bathroom.  He has a family history of heart problems, with multiple relatives having suffered from heart issues, including his brother and sister who passed away from  heart-related conditions. He has two daughters who are in the process of getting genetic testing due to the family history of heart disease.  Discussed the use of AI scribe software for clinical note transcription with the patient, who gave verbal consent to proceed.   Relevant histories: .  Social  - Brother: deceased due to heart problem - Sister: deceased due to heart problem - Two daughters who live in [place]; they are working on getting MYBC3 gene mutation testing ROS: As per HPI.   Studies Reviewed: .     Cardiac Studies & Procedures   ______________________________________________________________________________________________   STRESS TESTS  ECHOCARDIOGRAM STRESS TEST 10/12/2023  Narrative EXERCISE STRESS ECHO REPORT   --------------------------------------------------------------------------------  Patient Name:   Joseph Harvey  Date of Exam: 10/12/2023 Medical Rec #:  [id number]     Height:       69.0 in Accession #:    [id number]    Weight:       238.8 lb Date of Birth:  14-Oct-1955     BSA:          2.228 m Patient Age:    68 years      BP:           126/88 mmHg Patient Gender: M             HR:           101 bpm. Exam Location:  [facility]  Procedure: Cardiac Doppler, Color Doppler, Stress Echo and Limited Echo  Indications:    I42.1 HOCM  History:        Patient has prior history of Echocardiogram examinations, most recent 08/04/2023. Morbid Obesity, HOCM, GERD, Heart Failure with Preserved EF (HFpEF).  Sonographer:    Heather Hawks RDCS Referring Phys: DAPHNE LESCHES   Sonographer Comments: Image acquisition challenging due to patient body habitus, Image acquisition challenging due to respiratory motion and patient has difficulty staying in a LLD position. [facility] DPR SIGNED ON 09/19/19. OK TO SPEAK TO BROTHER JONAH Yonkers AND DAUGHTER NIKAY KELLY IMPRESSIONS   1. After repetitive squat and stand maneuver, LVOT gradient is 33 mmHG and MR is mild. 2.  Patient did not exercise on the treadmill. 3. Resting Echo: LVEF >75% (hyperdynamic), systolic anterior motion of mitral valve with mild MR, mild TR, no AR or AS, normal aortic root and proximal ascending aorta size. Peak LVOT gradient at rest . 4. Non-diagnostic study for ischemia due to the study being performed for LVOT obstruction assessment.  FINDINGS  Exam Protocol: The patient exercised on a treadmill according to a HOCM Squat to Stand protocol. Patient Completed Resting/Valsalva Imaging, and a 10 Second Squat to Stand. Completed Testing with Gradients increasing above per protocol.   Patient Performance: The patient exercised for 0 minutes and 0 seconds,. The baseline heart rate was 107 bpm. The heart rate at peak stress was 126 bpm. The target heart rate was calculated to be 130 bpm. The percentage of maximum predicted heart rate achieved was 82.4 %. The baseline blood pressure was 114/72 mmHg. The blood pressure at peak stress was 112/77 mmHg. The blood pressure response was flat. The patient developed fatigue, leg fatigue and shortness of breath during the stress exam. The symptoms did not resolve. Shortness of breath at baseline, on 3L of O2.  EKG: Resting EKG showed sinus tachycardia with No stress EKG.   2D Echo Findings: This is a non-diagnostic study for ischemia due to the study being performed for LVOT obstruction assessment.   Sunit Tolia Electronically signed on 10/12/2023 at 7:40:16 PM      Final   ECHOCARDIOGRAM  ECHOCARDIOGRAM COMPLETE 08/04/2023  Narrative ECHOCARDIOGRAM REPORT    Patient Name:   Joseph Harvey  Date of Exam: 08/04/2023 Medical Rec #:  [id number]     Height:       70.0 in Accession #:    [id number]    Weight:       235.0 lb Date of Birth:  1956-03-04     BSA:          2.235 m Patient Age:    67 years      BP:           120/60 mmHg Patient Gender: M             HR:           105 bpm. Exam Location:  [address]  Procedure:  2D Echo, Cardiac Doppler, Color Doppler and Intracardiac Opacification Agent (Both Spectral and Color Flow Doppler were utilized during procedure).  Indications:    I42.1 HOCM  History:        Patient has prior history of Echocardiogram examinations, most recent 09/05/2022. Risk Factors:Hypertension, Diabetes, Dyslipidemia and Current Smoker.  Sonographer:    Carl Rodgers-Jones RDCS Referring Phys: [id number] Joseph L SCHUMANN  IMPRESSIONS   1. Peak LVOT velocity 2.4 m/s, PG 23 mmHg. Left ventricular ejection fraction, by estimation, is >75%. The left ventricle has hyperdynamic function. The left ventricle has no  regional wall motion abnormalities. There is moderate left ventricular hypertrophy of the basal-septal segment. Left ventricular diastolic parameters are consistent with Grade I diastolic dysfunction (impaired relaxation). 2. Right ventricular systolic function is normal. The right ventricular size is normal. 3. The mitral valve is normal in structure. No evidence of mitral valve regurgitation. No evidence of mitral stenosis. 4. The aortic valve is normal in structure. Aortic valve regurgitation is not visualized. No aortic stenosis is present. 5. The inferior vena cava is normal in size with greater than 50% respiratory variability, suggesting right atrial pressure of 3 mmHg.  Conclusion(s)/Recommendation(s): Findings consistent with hypertrophic cardiomyopathy.  FINDINGS Left Ventricle: Peak LVOT velocity 2.4 m/s, PG 23 mmHg. Left ventricular ejection fraction, by estimation, is >75%. The left ventricle has hyperdynamic function. The left ventricle has no regional wall motion abnormalities. Definity  contrast agent was given IV to delineate the left ventricular endocardial borders. The left ventricular internal cavity size was normal in size. There is moderate left ventricular hypertrophy of the basal-septal segment. Left ventricular diastolic parameters are consistent with  Grade I diastolic dysfunction (impaired relaxation).  Right Ventricle: The right ventricular size is normal. No increase in right ventricular wall thickness. Right ventricular systolic function is normal.  Left Atrium: Left atrial size was normal in size.  Right Atrium: Right atrial size was normal in size.  Pericardium: There is no evidence of pericardial effusion.  Mitral Valve: The mitral valve is normal in structure. No evidence of mitral valve regurgitation. No evidence of mitral valve stenosis.  Tricuspid Valve: The tricuspid valve is normal in structure. Tricuspid valve regurgitation is not demonstrated. No evidence of tricuspid stenosis.  Aortic Valve: The aortic valve is normal in structure. Aortic valve regurgitation is not visualized. No aortic stenosis is present.  Pulmonic Valve: The pulmonic valve was normal in structure. Pulmonic valve regurgitation is not visualized. No evidence of pulmonic stenosis.  Aorta: The aortic root is normal in size and structure.  Venous: The inferior vena cava is normal in size with greater than 50% respiratory variability, suggesting right atrial pressure of 3 mmHg.  IAS/Shunts: No atrial level shunt detected by color flow Doppler.   LEFT VENTRICLE PLAX 2D LVIDd:         5.00 cm   Diastology LV PW:         0.80 cm   LV e' medial:    6.75 cm/s LV IVS:        1.50 cm   LV E/e' medial:  9.0 LVOT diam:     2.10 cm   LV e' lateral:   9.56 cm/s LV SV:         73        LV E/e' lateral: 6.4 LV SV Index:   33 LVOT Area:     3.46 cm   RIGHT VENTRICLE             IVC RV Basal diam:  4.40 cm     IVC diam: 1.00 cm RV S prime:     13.37 cm/s TAPSE (M-mode): 2.8 cm  LEFT ATRIUM             Index        RIGHT ATRIUM           Index LA Vol (A2C):   34.2 ml 15.30 ml/m  RA Area:     14.40 cm LA Vol (A4C):   47.1 ml 21.07 ml/m  RA Volume:   40.30 ml  18.03 ml/m  LA Biplane Vol: 42.1 ml 18.83 ml/m AORTIC VALVE LVOT Vmax:   121.33 cm/s LVOT  Vmean:  81.500 cm/s LVOT VTI:    0.211 m  AORTA Ao Root diam: 3.50 cm Ao Asc diam:  3.20 cm  MITRAL VALVE MV Area (PHT): 5.01 cm     SHUNTS MV Decel Time: 152 msec     Systemic VTI:  0.21 m MV E velocity: 60.80 cm/s   Systemic Diam: 2.10 cm MV A velocity: 100.00 cm/s MV E/A ratio:  0.61  Oneil Parchment MD Electronically signed by Oneil Parchment MD Signature Date/Time: 08/04/2023/3:15:53 PM    Final    MONITORS  LONG TERM MONITOR-LIVE TELEMETRY (3-14 DAYS) 04/18/2023  Narrative   Frequent PVCs (7.7% of beats)   7 episodes of NSVT, longest lasting 12 beats   4 episodes of SVT, longest lasting 18 beats   Patch Wear Time:  14 days and 14 hours (2024-11-01T02:49:03-399 to 2024-11-20T02:16:45-0500)  Monitor 1-1 day 6 hours Patient had a min HR of 84 bpm, max HR of 130 bpm, and avg HR of 102 bpm. Predominant underlying rhythm was Sinus Rhythm. Isolated SVEs were rare (<1.0%), and no SVE Couplets or SVE Triplets were present. Isolated VEs were occasional (2.6%, 3058), VE Couplets were rare (<1.0%, 61), and no VE Triplets were present. Ventricular Bigeminy and Trigeminy were present.  Monitor 2-13 days 8 hours Patient had a min HR of 71 bpm, max HR of 200 bpm, and avg HR of 99 bpm. Predominant underlying rhythm was Sinus Rhythm. 7 Ventricular Tachycardia runs occurred, the run with the fastest interval lasting 4 beats with a max rate of 176 bpm, the longest lasting 12 beats with an avg rate of 103 bpm. 4 Supraventricular Tachycardia runs occurred, the run with the fastest interval lasting 5 beats with a max rate of 200 bpm, the longest lasting 18 beats with an avg rate of 159 bpm. Isolated SVEs were rare (<1.0%), SVE Couplets were rare (<1.0%), and SVE Triplets were rare (<1.0%). Isolated VEs were frequent (7.7%, [id number]), VE Couplets were rare (<1.0%, 2351), and VE Triplets were rare (<1.0%, 345). Ventricular Bigeminy and Trigeminy were present.   CT SCANS  CT CARDIAC SCORING (SELF PAY  ONLY) 04/29/2020  Addendum 05/03/2020 10:16 PM ADDENDUM REPORT: 05/03/2020 22:14  CLINICAL DATA:  Risk stratification  EXAM: Coronary Calcium  Score  TECHNIQUE: The patient was scanned on a [employer] scanner. Axial non-contrast 3 mm slices were carried out through the heart. The data set was analyzed on a dedicated work station and scored using the Agatson method.  FINDINGS: Non-cardiac: See separate report from [facility].  Ascending Aorta: Normal  Pericardium: Normal  Coronary arteries: Calcium  score 117  IMPRESSION: Coronary calcium  score of 117. This was 81st percentile for age and sex matched control.   Electronically Signed By: Lonni Nanas MD On: 05/03/2020 22:14  Narrative EXAM: OVER-READ INTERPRETATION  CT CHEST  The following report is an over-read performed by radiologist Dr. Marcey Moan of [facility] on 04/29/2020. This over-read does not include interpretation of cardiac or coronary anatomy or pathology. The coronary calcium  score interpretation by the cardiologist is attached.  COMPARISON:  CTA of the chest on 07/24/2014  FINDINGS: Vascular: No significant vascular findings. Normal heart size. No pericardial effusion.  Mediastinum/Nodes: Visualized mediastinum and hilar regions demonstrate no lymphadenopathy or masses.  Lungs/Pleura: Visualized lungs show no evidence of pulmonary edema, consolidation, pneumothorax, nodule or pleural fluid.  Upper Abdomen: No acute abnormality.  Musculoskeletal: No  chest wall mass or suspicious bone lesions identified.  IMPRESSION: No significant incidental findings.  Electronically Signed: By: Marcey Moan M.D. On: 04/29/2020 09:28   CARDIAC MRI  MR CARDIAC MORPHOLOGY W WO CONTRAST 06/22/2020  Narrative CLINICAL DATA:  81M evaluate for HCM  EXAM: CARDIAC MRI  TECHNIQUE: The patient was scanned on a 1.5 Tesla Siemens magnet. A dedicated cardiac coil  was used. Functional imaging was done using Fiesta sequences. 2,3, and 4 chamber views were done to assess for RWMA's. Modified Simpson's rule using a short axis stack was used to calculate an ejection fraction on a dedicated work [employer]. The patient received 10 cc of Gadavist . After 10 minutes inversion recovery sequences were used to assess for infiltration and scar tissue.  CONTRAST:  10 cc  of Gadavist   FINDINGS: Left ventricle:  -Asymmetric hypertrophy measuring up to 20mm in basal anteroseptum (8mm in posterior wall)  -Normal size  -Normal systolic function  -Mild ECV elevation (29%)  -Patchy LGE in basal septum and RV insertion sites. LGE accounts for 11% of total myocardial mass  LV EF: 63% (Normal 56-78%)  Absolute volumes:  LV EDV: (Normal 77-195 mL)  LV ESV: 43mL (Normal 19-72 mL)  LV SV: 73mL (Normal 51-133 mL)  CO: 6.2L/min (Normal 2.8-8.8 L/min)  Indexed volumes:  LV EDV: 21mL/sq-m (Normal 47-92 mL/sq-m)  LV ESV: 50mL/sq-m (Normal 13-30 mL/sq-m)  LV SV: 82mL/sq-m (Normal 32-62 mL/sq-m)  CI: 2.8L/min/sq-m (Normal 1.7-4.2 L/min/sq-m)  Right ventricle: Normal size and systolic function  RV EF:  63% (Normal 47-74%)  Absolute volumes:  RV EDV: (Normal 88-227 mL)  RV ESV: 50mL (Normal 23-103 mL)  RV SV: 82mL (Normal 52-138 mL)  CO: 7.0L/min (Normal 2.8-8.8 L/min)  Indexed volumes:  RV EDV: 54mL/sq-m (Normal 55-105 mL/sq-m)  RV ESV: 16mL/sq-m (Normal 15-43 mL/sq-m)  RV SV: 72mL/sq-m (Normal 32-64 mL/sq-m)  CI: 3.1L/min/sq-m (Normal 1.7-4.2 L/min/sq-m)  Left atrium: Mild enlargement  Right atrium: Normal size  Mitral valve: No regurgitation  Aortic valve: No regurgitation  Tricuspid valve: No regurgitation  Pulmonic valve: No regurgitation  Aorta: Normal proximal ascending aorta  Pericardium: Normal  IMPRESSION: 1. Asymmetric hypertrophy measuring up to 20mm in basal anteroseptum (8mm in  posterior wall), consistent with hypertrophic cardiomyopathy  2. Patchy LGE in basal septum and RV insertion sites, consistent with HCM. LGE accounts for 11% of total myocardial mass  3. Normal LV size and systolic function (EF 63%)  4. Normal RV size and systolic function (EF 63%)   Electronically Signed By: Lonni Nanas MD On: 06/23/2020 22:42   ______________________________________________________________________________________________        Physical Exam:    VS:  BP (!) 128/92   Pulse 100   Ht 5' 9 (1.753 m)   Wt 249 lb (112.9 kg)   SpO2 96%   BMI 36.77 kg/m    Wt Readings from Last 3 Encounters:  04/03/24 249 lb (112.9 kg)  01/04/24 249 lb (112.9 kg)  11/28/23 249 lb (112.9 kg)    Gen: no distress, morbid obesity   Neck: No JVD Cardiac: No Rubs or Gallops, systolic murmur, regular tachycardia, +2 radial pulses Respiratory: Bilateral inspiratory wheezes bilaterally, normal effort, normal  respiratory rate GI: Soft, nontender, non-distended  MS: No  edema;  moves all extremities Integument: Skin feels warm Neuro:  At time of evaluation, alert and oriented to person/place/time/situation  Psych: Normal affect, patient feels ok  ASSESSMENT AND PLAN: .    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 No evidence of obstructive coronary disease. Heart rate variability noted. No evidence of atrial fibrillation or flutter on heart monitor. Symptoms of bending down and breathing difficulties are improving. Current dose of 10 mg appears effective. - Continue current medication regimen: max dose AV nodal therapy; he is on mavacamten  10 mg with f/u echo in 4/12 weeks (ordered)  HCM/AF Score  21; has live heart monitor now for racing heart SCD eval pending (live heart monitor now)     Heart failure with preserved ejection fraction (HFpEF) Symptoms are improving. No significant pitting edema. Previous acute kidney injury with diuretics noted, so cautious with fluid management. - Continue current torsemide . - Checked kidney function with BMP.  Hypokalemia Potassium supplementation ongoing. Recent increase in potassium intake noted. BMP as above  COPD Intermittent wheezing noted, with improvement since starting current treatment. Smoking cessation efforts ongoing, which may contribute to improvement. - Continue smoking cessation efforts. - offered 14 mg Patches (deferred)  Obstructive sleep apnea Previous management by Dr. Burnard. Asked for care from Dr. Randine Bihari for continued management. Apnea-hypopnea index to be evaluated for potential weight management strategies.  Morbid Obesity Management ongoing with Ozempic . Recent issues with medication administration noted, but resolved. Weight loss efforts are ongoing. - Continue Ozempic  for weight management. - Provided additional teaching if needed for medication administration.  Edema of lower extremity Edema present but not severe. Current management includes diuretics with caution due to previous kidney injury. - Continue current diuretic regimen with caution. - Monitor for signs of kidney dysfunction.   March f/u with me or Tessa   Longitudinal care: The evaluation and management services provided today reflect the complexity inherent in caring for this patient, including the ongoing longitudinal relationship and management of multiple chronic conditions and/or the need for care coordination. The visit required a comprehensive assessment and management plan tailored to the patient's unique needs Time was spent addressing not only the acute concerns but also the broader context of the patient's health, including  preventive care, chronic disease management, and care coordination as appropriate.  Complex longitudinal is necessary for conditions including: complex COPD and sarcomeric oHCM    Stanly Leavens, MD FASE [facility] Cardiologist [facility]  [address] [place], [address] [place], [place] [address] [phone]  1:14 PM

## 2024-04-03 NOTE — Patient Instructions (Signed)
 Medication Instructions:   Continue all current medications.   Labwork:  BMET - order given today  Office will contact with results via phone, letter or mychart.     Testing/Procedures:  none  Follow-Up:  March 2026  Any Other Special Instructions Will Be Listed Below (If Applicable).   If you need a refill on your cardiac medications before your next appointment, please call your pharmacy.

## 2024-04-04 ENCOUNTER — Telehealth: Payer: Self-pay | Admitting: Internal Medicine

## 2024-04-04 NOTE — Telephone Encounter (Signed)
 Daughter is calling about the Genetic testing. Please advise

## 2024-04-04 NOTE — Telephone Encounter (Signed)
 Call from patient's Dtr Johnetta [facility]) stating she would like to know more about genetic testing suggested for patient's family so she can look into insurance coverage. She would also like to know if her 1 and 5 year olds can be tested.

## 2024-04-07 ENCOUNTER — Ambulatory Visit: Payer: Self-pay | Admitting: Internal Medicine

## 2024-04-10 ENCOUNTER — Other Ambulatory Visit: Payer: Self-pay | Admitting: Pulmonary Disease

## 2024-04-10 NOTE — Telephone Encounter (Signed)
 Dr. Annella, please advise if okay to refill as needed prednisone ?

## 2024-04-18 ENCOUNTER — Telehealth: Payer: Self-pay | Admitting: *Deleted

## 2024-04-18 DIAGNOSIS — G4733 Obstructive sleep apnea (adult) (pediatric): Secondary | ICD-10-CM

## 2024-04-18 NOTE — Telephone Encounter (Signed)
-----   Message from Stanly DELENA Leavens sent at 04/05/2024 11:19 AM EST ----- Can we get him non-urgently in with Dr. Shlomo (Sleep) ----- Message ----- From: Shlomo Wilbert SAUNDERS, MD Sent: 04/04/2024   6:27 AM EST To: Stanly DELENA Leavens, MD  Yes absolutely ----- Message ----- From: Leavens Stanly DELENA, MD Sent: 04/03/2024   1:14 PM EST To: Wilbert SAUNDERS Shlomo, MD  Would you be will to see him in [facility] f/u?  Former [facility] Patient

## 2024-04-22 ENCOUNTER — Telehealth: Payer: Self-pay | Admitting: Internal Medicine

## 2024-04-22 DIAGNOSIS — Z79899 Other long term (current) drug therapy: Secondary | ICD-10-CM

## 2024-04-22 DIAGNOSIS — I5032 Chronic diastolic (congestive) heart failure: Secondary | ICD-10-CM

## 2024-04-22 NOTE — Telephone Encounter (Signed)
*  STAT* If patient is at the pharmacy, call can be transferred to refill team.   1. Which medications need to be refilled? (please list name of each medication and dose if known)    mavacamten  (CAMZYOS ) 10 MG CAPS capsule     4. Which pharmacy/location (including street and city if local pharmacy) is medication to be sent to?  [facility] [place], [place] - [address]     5. Do they need a 30 day or 90 day supply? 90

## 2024-04-23 ENCOUNTER — Other Ambulatory Visit: Payer: Self-pay

## 2024-04-23 DIAGNOSIS — Z79899 Other long term (current) drug therapy: Secondary | ICD-10-CM

## 2024-04-23 DIAGNOSIS — I5032 Chronic diastolic (congestive) heart failure: Secondary | ICD-10-CM

## 2024-04-23 MED ORDER — MAVACAMTEN 10 MG PO CAPS: 10.0000 mg | ORAL_CAPSULE | Freq: Every day | ORAL | 0 refills | Status: DC

## 2024-04-23 NOTE — Telephone Encounter (Signed)
 Called pt reports has 5-8 tablets of mavacamten  left.  Advised will send in a prescription to the pharmacy for 5 tablets.  Next echo is 05/02/24.  Pt reports already has a shipment of 5 coming on Friday.   Pt reports up all night urinating d/t taking fluid pill 3 times daily.  Advised pt per chart torsemide  is only scheduled 40 mg once daily.  Pt clarified taking torsemide  20 mg twice daily with a 3rd dose if has swelling.  Also reports increased fatigue MD request BMP at 05/12/24.  Pt made aware orders placed.

## 2024-04-24 ENCOUNTER — Telehealth: Payer: Self-pay | Admitting: Pharmacy Technician

## 2024-04-24 ENCOUNTER — Other Ambulatory Visit (HOSPITAL_COMMUNITY): Payer: Self-pay

## 2024-04-24 NOTE — Telephone Encounter (Signed)
° °  Pharmacy Patient Advocate Encounter   Received notification from CoverMyMeds that prior authorization for CAMZYOS  is required/requested.   Insurance verification completed.   The patient is insured through [employer].   Per test claim: PA required; PA submitted to above mentioned insurance via Latent Key/confirmation #/EOC [id number] Status is pending

## 2024-04-24 NOTE — Telephone Encounter (Signed)
 Pharmacy Patient Advocate Encounter  Received notification from [employer] that Prior Authorization for CAMZYOS  has been APPROVED from 04/24/24 to 05/15/25   PA #/Case ID/Reference #: [id number]

## 2024-04-25 ENCOUNTER — Other Ambulatory Visit: Payer: Self-pay | Admitting: Internal Medicine

## 2024-04-26 ENCOUNTER — Other Ambulatory Visit: Payer: Self-pay

## 2024-05-01 ENCOUNTER — Encounter

## 2024-05-01 ENCOUNTER — Telehealth: Payer: Self-pay

## 2024-05-01 ENCOUNTER — Ambulatory Visit: Payer: Self-pay | Admitting: Internal Medicine

## 2024-05-01 DIAGNOSIS — R000 Tachycardia, unspecified: Secondary | ICD-10-CM | POA: Diagnosis not present

## 2024-05-01 DIAGNOSIS — R079 Chest pain, unspecified: Secondary | ICD-10-CM

## 2024-05-01 NOTE — Telephone Encounter (Signed)
Pending echo tomorrow.

## 2024-05-01 NOTE — Telephone Encounter (Signed)
 Please schedule office visit with me or APP, next available.  Thanks

## 2024-05-01 NOTE — Telephone Encounter (Signed)
 Good Morning Dr. Nandigam and Norleen,  This patient was scheduled for a PV today and a colonoscopy w/ you @ the [facility] on 05/23/24.  I spoke with him this morning, he is on Home O2 so per [facility] guidelines, he will need to be done at the hospital.  He is also getting echocardiograms Q 4weeks.  He told me this is d/t him being on a heart medication called Camsya and that his cardiologist (Dr. Santo) told him he would need to be contacted before any procedures.  I have canceled his PV and [facility] colonoscopy.  Please advise if you want to proceed with an OV first or direct at the hospital.  We will also need to r/s his PV when his procedure is rescheduled. Thank you, Geralyn Figiel

## 2024-05-02 ENCOUNTER — Telehealth: Payer: Self-pay | Admitting: Cardiology

## 2024-05-02 ENCOUNTER — Other Ambulatory Visit: Payer: Self-pay | Admitting: Internal Medicine

## 2024-05-02 ENCOUNTER — Ambulatory Visit (HOSPITAL_COMMUNITY)
Admission: RE | Admit: 2024-05-02 | Discharge: 2024-05-02 | Attending: Cardiovascular Disease | Admitting: Cardiovascular Disease

## 2024-05-02 DIAGNOSIS — I421 Obstructive hypertrophic cardiomyopathy: Secondary | ICD-10-CM

## 2024-05-02 LAB — BASIC METABOLIC PANEL WITH GFR
BUN/Creatinine Ratio: 13 (ref 10–24)
BUN: 18 mg/dL (ref 8–27)
CO2: 28 mmol/L (ref 20–29)
Calcium: 9.8 mg/dL (ref 8.6–10.2)
Chloride: 92 mmol/L — ABNORMAL LOW (ref 96–106)
Creatinine, Ser: 1.36 mg/dL — ABNORMAL HIGH (ref 0.76–1.27)
Glucose: 153 mg/dL — ABNORMAL HIGH (ref 70–99)
Potassium: 3.7 mmol/L (ref 3.5–5.2)
Sodium: 137 mmol/L (ref 134–144)
eGFR: 57 mL/min/1.73 — ABNORMAL LOW (ref >59)

## 2024-05-02 LAB — ECHOCARDIOGRAM LIMITED
Area-P 1/2: 5.22 cm2
S' Lateral: 2.1 cm

## 2024-05-02 NOTE — Telephone Encounter (Signed)
 Spoke with pt OV scheduled for 1/23 at 10:40 AM

## 2024-05-02 NOTE — Telephone Encounter (Signed)
 Pt c/o medication issue:  1. Name of Medication:   mavacamten  (CAMZYOS ) 10 MG CAPS capsule   2. How are you currently taking this medication (dosage and times per day)?   3. Are you having a reaction (difficulty breathing--STAT)?   4. What is your medication issue?   Caller [facility]) stated they will need to get patient new echo results uploaded to [facility] portal.  Caller noted patient is out of medication and will need updated quantity forwarded to them as soon as possible so they can overnight patient's medication.

## 2024-05-03 MED ORDER — MAVACAMTEN 10 MG PO CAPS: 10.0000 mg | ORAL_CAPSULE | Freq: Every day | ORAL | 1 refills | Status: AC

## 2024-05-03 NOTE — Telephone Encounter (Signed)
Calling for update. Please advise  

## 2024-05-03 NOTE — Addendum Note (Signed)
 Addended by: RANDY HAMP SAILOR on: 05/03/2024 01:47 PM   Modules accepted: Orders

## 2024-05-03 NOTE — Telephone Encounter (Signed)
 The patient has been notified of the result and verbalized understanding.  All questions (if any) were answered. Hamp SAILOR Cassandra Harbold, RN 05/03/2024 1:44 PM   PSF updated on [facility] portal and mavacamten  10 mg refill sent to specialty pharmacy on file.

## 2024-05-04 ENCOUNTER — Other Ambulatory Visit: Payer: Self-pay | Admitting: Internal Medicine

## 2024-05-05 ENCOUNTER — Ambulatory Visit: Payer: Self-pay | Admitting: Internal Medicine

## 2024-05-06 NOTE — Telephone Encounter (Signed)
 Pt of Dr. Santo. Does Dr. Santo want to refill this RX? Please advise.

## 2024-05-15 ENCOUNTER — Telehealth: Payer: Self-pay

## 2024-05-15 NOTE — Telephone Encounter (Signed)
 Pt returned nurses call regarding appt. Pt states that he has appt in March. He would like a c/ regarding this matter. Please advise

## 2024-05-15 NOTE — Telephone Encounter (Signed)
 S/w the patient and informed him that his March appointment is fine, I think she did not realize that you already had an appt scheduled. I will let the nurse know that you have an appt scheduled for March.  He verbalized understanding.

## 2024-05-15 NOTE — Telephone Encounter (Signed)
 Called pt advised of MD request for OV for consideration of RHC.  OV scheduled for 05/27/24 at 9:20 am.  All questions answered.

## 2024-05-15 NOTE — Telephone Encounter (Signed)
 Called pt left  message asking pt to call in or send a my chart message to schedule an OV with Dr. Santo.

## 2024-05-17 ENCOUNTER — Ambulatory Visit (HOSPITAL_BASED_OUTPATIENT_CLINIC_OR_DEPARTMENT_OTHER)
Admission: RE | Admit: 2024-05-17 | Discharge: 2024-05-17 | Disposition: A | Discharge status: Home / Self Care | Source: Ambulatory Visit | Attending: Nurse Practitioner | Admitting: Nurse Practitioner

## 2024-05-17 DIAGNOSIS — Z7952 Long term (current) use of systemic steroids: Secondary | ICD-10-CM | POA: Insufficient documentation

## 2024-05-17 DIAGNOSIS — Z13820 Encounter for screening for osteoporosis: Secondary | ICD-10-CM | POA: Insufficient documentation

## 2024-05-23 ENCOUNTER — Encounter: Admitting: Gastroenterology

## 2024-05-29 ENCOUNTER — Encounter: Payer: Self-pay | Admitting: Internal Medicine

## 2024-05-29 ENCOUNTER — Ambulatory Visit: Attending: Internal Medicine | Admitting: Internal Medicine

## 2024-05-29 VITALS — BP 128/80 | HR 125 | Ht 69.0 in | Wt 241.0 lb

## 2024-05-29 DIAGNOSIS — I10 Essential (primary) hypertension: Secondary | ICD-10-CM

## 2024-05-29 DIAGNOSIS — R000 Tachycardia, unspecified: Secondary | ICD-10-CM | POA: Diagnosis not present

## 2024-05-29 DIAGNOSIS — I2510 Atherosclerotic heart disease of native coronary artery without angina pectoris: Secondary | ICD-10-CM

## 2024-05-29 DIAGNOSIS — I493 Ventricular premature depolarization: Secondary | ICD-10-CM

## 2024-05-29 MED ORDER — TORSEMIDE 20 MG PO TABS: 60.0000 mg | ORAL_TABLET | Freq: Every day | ORAL | 1 refills | Status: DC

## 2024-05-29 NOTE — H&P (View-Only) (Signed)
 " Cardiology Office Note:  .    Date:  05/29/2024  ID:  Joseph Harvey, DOB February 28, 1956, MRN [id number] PCP: Arloa Jarvis, NP  [facility] Providers Cardiologist:  Lonni LITTIE Nanas, MD Electrophysiologist:  Soyla Gladis Norton, MD     CC: Follow up HCM Care  History of Present Illness: .    Joseph Harvey is a 69 y.o. male with hypertrophic obstructive cardiomyopathy and coronary artery disease who presents for evaluation of persistent tachycardia and fluid retention.  He experiences persistent tachycardia with heart rates ranging from 89 to 125 beats per minute, increasing upon standing and reaching up to 120 beats per minute later in the day. He feels better when his heart rate is lower, around 69 beats per minute. He is currently taking metoprolol  in the morning. His blood pressure is controlled but his heart rate remains elevated. No chest pain, but he reports shortness of breath that remains unchanged, although he feels better in the chest area. Physical activities like walking or lifting cause significant exertion. He uses oxygen at home during sleep and when engaging in activities that require exertion.  He experiences swelling in his feet, which fluctuates throughout the day. He uses ice packs to reduce the swelling, which provides some relief. He is currently on 40 mg of torsemide  daily and occasionally takes an extra dose for additional swelling. He avoids taking the medication when he needs to be away from home due to the diuretic effect and lack of bathroom access.  He has a history of coronary artery calcifications and has been unable to complete noninvasive tests due to elevated heart rates. A stress test in 2021 was normal, but calcium  testing for lung cancer screening showed plaque buildup in his arteries.  He has been losing weight with the help of Ozempic , having lost approximately 8 pounds recently. He also has a history of COPD and uses inhalers and a nebulizer as  needed, noting a decrease in his use recently. He occasionally takes prednisone  to help with breathing issues.  He has a significant smoking history and has been trying to reduce his cigarette consumption. Recent family stressors, including the death of his children's mother and other family members, have impacted his smoking habits.  Discussed the use of AI scribe software for clinical note transcription with the patient, who gave verbal consent to proceed.   Relevant histories: .  Social  - Brother: deceased due to heart problem - Sister: deceased due to heart problem - Two daughters who live in [place]; they are working on getting MYBC3 gene mutation testing at [facility] - his estranged children's mother passed suddenly ROS: As per HPI.   Studies Reviewed: .     Cardiac Studies & Procedures   ______________________________________________________________________________________________   STRESS TESTS  ECHOCARDIOGRAM STRESS TEST 10/12/2023  Narrative EXERCISE STRESS ECHO REPORT   --------------------------------------------------------------------------------  Patient Name:   Joseph Harvey  Date of Exam: 10/12/2023 Medical Rec #:  [id number]     Height:       69.0 in Accession #:    [id number]    Weight:       238.8 lb Date of Birth:  03/07/1956     BSA:          2.228 m Patient Age:    68 years      BP:           126/88 mmHg Patient Gender: M  HR:           101 bpm. Exam Location:  [facility]  Procedure: Cardiac Doppler, Color Doppler, Stress Echo and Limited Echo  Indications:    I42.1 HOCM  History:        Patient has prior history of Echocardiogram examinations, most recent 08/04/2023. Morbid Obesity, HOCM, GERD, Heart Failure with Preserved EF (HFpEF).  Sonographer:    Heather Hawks RDCS Referring Phys: DAPHNE LESCHES   Sonographer Comments: Image acquisition challenging due to patient body habitus, Image acquisition challenging due to  respiratory motion and patient has difficulty staying in a LLD position. [facility] DPR SIGNED ON 09/19/19. OK TO SPEAK TO BROTHER JONAH Hasty AND DAUGHTER NIKAY KELLY IMPRESSIONS   1. After repetitive squat and stand maneuver, LVOT gradient is 33 mmHG and MR is mild. 2. Patient did not exercise on the treadmill. 3. Resting Echo: LVEF >75% (hyperdynamic), systolic anterior motion of mitral valve with mild MR, mild TR, no AR or AS, normal aortic root and proximal ascending aorta size. Peak LVOT gradient at rest . 4. Non-diagnostic study for ischemia due to the study being performed for LVOT obstruction assessment.  FINDINGS  Exam Protocol: The patient exercised on a treadmill according to a HOCM Squat to Stand protocol. Patient Completed Resting/Valsalva Imaging, and a 10 Second Squat to Stand. Completed Testing with Gradients increasing above per protocol.   Patient Performance: The patient exercised for 0 minutes and 0 seconds,. The baseline heart rate was 107 bpm. The heart rate at peak stress was 126 bpm. The target heart rate was calculated to be 130 bpm. The percentage of maximum predicted heart rate achieved was 82.4 %. The baseline blood pressure was 114/72 mmHg. The blood pressure at peak stress was 112/77 mmHg. The blood pressure response was flat. The patient developed fatigue, leg fatigue and shortness of breath during the stress exam. The symptoms did not resolve. Shortness of breath at baseline, on 3L of O2.  EKG: Resting EKG showed sinus tachycardia with No stress EKG.   2D Echo Findings: This is a non-diagnostic study for ischemia due to the study being performed for LVOT obstruction assessment.   Sunit Tolia Electronically signed on 10/12/2023 at 7:40:16 PM      Final   ECHOCARDIOGRAM  ECHOCARDIOGRAM LIMITED 05/02/2024  Narrative ECHOCARDIOGRAM REPORT    Patient Name:   Joseph Harvey  Date of Exam: 05/02/2024 Medical Rec #:  [id number]     Height:        69.0 in Accession #:    [id number]    Weight:       249.0 lb Date of Birth:  Feb 25, 1956     BSA:          2.268 m Patient Age:    68 years      BP:           147/91 mmHg Patient Gender: M             HR:           125 bpm. Exam Location:  [address]  Procedure: Limited Echo, Cardiac Doppler and Limited Color Doppler (Both Spectral and Color Flow Doppler were utilized during procedure).  Indications:    I42.1 HOCM  History:        Patient has prior history of Echocardiogram examinations, most recent 03/21/2024. Hypertrophic Cardiomyopathy and CHF, COPD, Arrythmias:PVC, Signs/Symptoms:Shortness of Breath, Chest Pain, Dizziness/Lightheadedness, Edema, Fatigue and Murmur; Risk Factors:Hypertension, Diabetes, Dyslipidemia and Current Smoker. HOCM- Camzyos  (  4wk follow up after increase to 10mg - from 5mg ) Patient still says he is SOB, but does say it seems somewhat better overall. Obesity.  Sonographer:    Heather Hawks RDCS Referring Phys: [facility] A SANTO   Sonographer Comments: HOCM LTD- Camzyos  protocol with squat to stand performed at end. IMPRESSIONS   1. Left ventricular ejection fraction, by estimation, is 65 to 70%. The left ventricle has normal function. The left ventricle has no regional wall motion abnormalities. There is moderate asymmetric left ventricular hypertrophy of the septal segment. Left ventricular diastolic parameters are consistent with Grade I diastolic dysfunction (impaired relaxation). 2. Right ventricular systolic function is normal. The right ventricular size is normal. 3. The mitral valve is normal in structure. Trivial mitral valve regurgitation. No evidence of mitral stenosis. 4. The aortic valve is normal in structure. Aortic valve regurgitation is not visualized. No aortic stenosis is present. 5. The inferior vena cava is normal in size with greater than 50% respiratory variability, suggesting right atrial pressure of 3  mmHg.  Conclusion(s)/Recommendation(s): No significnt LVOT gradient at rest or with Valsalva or squand/stand provocative maneuvers. Peak LVOT gradient with Valsalva was .  FINDINGS Left Ventricle: Left ventricular ejection fraction, by estimation, is 65 to 70%. The left ventricle has normal function. The left ventricle has no regional wall motion abnormalities. The left ventricular internal cavity size was normal in size. There is moderate asymmetric left ventricular hypertrophy of the septal segment. Left ventricular diastolic parameters are consistent with Grade I diastolic dysfunction (impaired relaxation).  Right Ventricle: The right ventricular size is normal. No increase in right ventricular wall thickness. Right ventricular systolic function is normal.  Left Atrium: Left atrial size was normal in size.  Right Atrium: Right atrial size was normal in size.  Pericardium: There is no evidence of pericardial effusion.  Mitral Valve: The mitral valve is normal in structure. Trivial mitral valve regurgitation. No evidence of mitral valve stenosis.  Tricuspid Valve: The tricuspid valve is normal in structure. Tricuspid valve regurgitation is mild . No evidence of tricuspid stenosis.  Aortic Valve: The aortic valve is normal in structure. Aortic valve regurgitation is not visualized. No aortic stenosis is present.  Pulmonic Valve: The pulmonic valve was normal in structure. Pulmonic valve regurgitation is not visualized. No evidence of pulmonic stenosis.  Aorta: The aortic root is normal in size and structure.  Venous: The inferior vena cava is normal in size with greater than 50% respiratory variability, suggesting right atrial pressure of 3 mmHg.  IAS/Shunts: No atrial level shunt detected by color flow Doppler.   LEFT VENTRICLE PLAX 2D LVIDd:         4.40 cm   Diastology LVIDs:         2.10 cm   LV e' medial:    5.98 cm/s LV PW:         1.20 cm   LV E/e' medial:  7.2 LV IVS:         1.00 cm   LV e' lateral:   10.35 cm/s LVOT diam:     2.40 cm   LV E/e' lateral: 4.2 LV SV:         71 LV SV Index:   31 LVOT Area:     4.52 cm   RIGHT VENTRICLE RV Basal diam:  3.30 cm     PULMONARY VEINS RV S prime:     16.70 cm/s  A Reversal Velocity: 33.90 cm/s TAPSE (M-mode): 2.2 cm  Diastolic Velocity:  63.80 cm/s RVSP:           25.1 mmHg   S/D Velocity:        0.90 Systolic Velocity:   57.30 cm/s  LEFT ATRIUM           Index        RIGHT ATRIUM           Index LA diam:      3.60 cm 1.59 cm/m   RA Pressure: 3.00 mmHg LA Vol (A4C): 79.8 ml 35.19 ml/m  RA Area:     18.80 cm RA Volume:   58.70 ml  25.89 ml/m AORTIC VALVE LVOT Vmax:   112.00 cm/s LVOT Vmean:  73.300 cm/s LVOT VTI:    0.156 m  AORTA Ao Root diam: 3.40 cm Ao Asc diam:  3.30 cm  MITRAL VALVE               TRICUSPID VALVE MV Area (PHT)  cm         TR Peak grad:   22.1 mmHg MV Decel Time: 145 msec    TR Vmax:        235.00 cm/s MV E velocity: 42.97 cm/s  Estimated RAP:  3.00 mmHg MV A velocity: 73.53 cm/s  RVSP:           25.1 mmHg MV E/A ratio:  0.58 SHUNTS Systemic VTI:  0.16 m Systemic Diam: 2.40 cm  Toribio Fuel MD Electronically signed by Toribio Fuel MD Signature Date/Time: 05/02/2024/11:39:50 AM    Final    MONITORS  LONG TERM MONITOR-LIVE TELEMETRY (3-14 DAYS) 04/24/2024  Narrative   Patient had a minimum heart rate of 80 bpm, maximum heart rate of 194 bpm, and average heart rate of 113 bpm. Predominant underlying rhythm was sinus rhythm. Small NSVT burden, < 3 episodes and rate < 200 bpm. No atrial fibrillation or flutter Isolated PACs were rare (<1.0%). Isolated PVCs were occasional (2.1%). Triggered and diary events associated with sinus tachycardia.  No malignant arrhythmias of HCM.  Resting tachycardia.   CT SCANS  CT CARDIAC SCORING (SELF PAY ONLY) 04/29/2020  Addendum 05/03/2020 10:16 PM ADDENDUM REPORT: 05/03/2020 22:14  CLINICAL DATA:  Risk  stratification  EXAM: Coronary Calcium  Score  TECHNIQUE: The patient was scanned on a [employer] scanner. Axial non-contrast 3 mm slices were carried out through the heart. The data set was analyzed on a dedicated work station and scored using the Agatson method.  FINDINGS: Non-cardiac: See separate report from [facility].  Ascending Aorta: Normal  Pericardium: Normal  Coronary arteries: Calcium  score 117  IMPRESSION: Coronary calcium  score of 117. This was 81st percentile for age and sex matched control.   Electronically Signed By: Lonni Nanas MD On: 05/03/2020 22:14  Narrative EXAM: OVER-READ INTERPRETATION  CT CHEST  The following report is an over-read performed by radiologist Dr. Marcey Moan of [facility] on 04/29/2020. This over-read does not include interpretation of cardiac or coronary anatomy or pathology. The coronary calcium  score interpretation by the cardiologist is attached.  COMPARISON:  CTA of the chest on 07/24/2014  FINDINGS: Vascular: No significant vascular findings. Normal heart size. No pericardial effusion.  Mediastinum/Nodes: Visualized mediastinum and hilar regions demonstrate no lymphadenopathy or masses.  Lungs/Pleura: Visualized lungs show no evidence of pulmonary edema, consolidation, pneumothorax, nodule or pleural fluid.  Upper Abdomen: No acute abnormality.  Musculoskeletal: No chest wall mass or suspicious bone lesions identified.  IMPRESSION: No significant incidental findings.  Electronically  Signed: By: Marcey Moan M.D. On: 04/29/2020 09:28   CARDIAC MRI  MR CARDIAC MORPHOLOGY W WO CONTRAST 06/22/2020  Narrative CLINICAL DATA:  82M evaluate for HCM  EXAM: CARDIAC MRI  TECHNIQUE: The patient was scanned on a 1.5 Tesla Siemens magnet. A dedicated cardiac coil was used. Functional imaging was done using [employer] sequences. 2,3, and 4 chamber views were done to assess  for RWMA's. Modified Simpson's rule using a short axis stack was used to calculate an ejection fraction on a dedicated work [employer]. The patient received 10 cc of Gadavist . After 10 minutes inversion recovery sequences were used to assess for infiltration and scar tissue.  CONTRAST:  10 cc  of Gadavist   FINDINGS: Left ventricle:  -Asymmetric hypertrophy measuring up to 20mm in basal anteroseptum (8mm in posterior wall)  -Normal size  -Normal systolic function  -Mild ECV elevation (29%)  -Patchy LGE in basal septum and RV insertion sites. LGE accounts for 11% of total myocardial mass  LV EF: 63% (Normal 56-78%)  Absolute volumes:  LV EDV: (Normal 77-195 mL)  LV ESV: 43mL (Normal 19-72 mL)  LV SV: 73mL (Normal 51-133 mL)  CO: 6.2L/min (Normal 2.8-8.8 L/min)  Indexed volumes:  LV EDV: 82mL/sq-m (Normal 47-92 mL/sq-m)  LV ESV: 52mL/sq-m (Normal 13-30 mL/sq-m)  LV SV: 15mL/sq-m (Normal 32-62 mL/sq-m)  CI: 2.8L/min/sq-m (Normal 1.7-4.2 L/min/sq-m)  Right ventricle: Normal size and systolic function  RV EF:  63% (Normal 47-74%)  Absolute volumes:  RV EDV: (Normal 88-227 mL)  RV ESV: 50mL (Normal 23-103 mL)  RV SV: 82mL (Normal 52-138 mL)  CO: 7.0L/min (Normal 2.8-8.8 L/min)  Indexed volumes:  RV EDV: 16mL/sq-m (Normal 55-105 mL/sq-m)  RV ESV: 55mL/sq-m (Normal 15-43 mL/sq-m)  RV SV: 58mL/sq-m (Normal 32-64 mL/sq-m)  CI: 3.1L/min/sq-m (Normal 1.7-4.2 L/min/sq-m)  Left atrium: Mild enlargement  Right atrium: Normal size  Mitral valve: No regurgitation  Aortic valve: No regurgitation  Tricuspid valve: No regurgitation  Pulmonic valve: No regurgitation  Aorta: Normal proximal ascending aorta  Pericardium: Normal  IMPRESSION: 1. Asymmetric hypertrophy measuring up to 20mm in basal anteroseptum (8mm in posterior wall), consistent with hypertrophic cardiomyopathy  2. Patchy LGE in basal septum and RV  insertion sites, consistent with HCM. LGE accounts for 11% of total myocardial mass  3. Normal LV size and systolic function (EF 63%)  4. Normal RV size and systolic function (EF 63%)   Electronically Signed By: Lonni Nanas MD On: 06/23/2020 22:42   ______________________________________________________________________________________________        Physical Exam:    VS:  BP 128/80   Pulse (!) 125   Ht 5' 9 (1.753 m)   Wt 241 lb (109.3 kg)   SpO2 98%   BMI 35.59 kg/m    Wt Readings from Last 3 Encounters:  05/29/24 241 lb (109.3 kg)  04/03/24 249 lb (112.9 kg)  01/04/24 249 lb (112.9 kg)    Gen: no distress, morbid obesity   Neck: No JVD Cardiac: No Rubs or Gallops, systolic murmur, regular tachycardia, +2 radial pulses Respiratory: Bilateral inspiratory wheezes bilaterally, normal effort, normal  respiratory rate GI: Soft, nontender, non-distended  MS: +2 ankle edema;  moves all extremities Integument: Skin feels warm Neuro:  At time of evaluation, alert and oriented to person/place/time/situation  Psych: Normal affect, patient feels ok  ASSESSMENT AND PLAN: .    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 Previous stress test in 2021 was normal. Non-invasive tests for significant blockage disease were not completed due to elevated heart rate (CCTA and CPET) - Will proceed with heart catheterization to assess for significant blockages.  Risks and benefits of cardiac catheterization have been discussed with the patient.  These include bleeding, infection, kidney damage, stroke, heart attack, death.  The patient understands these risks and is willing to  proceed.  Access recommendations: R radial, R AC Procedural considerations if significant CAD, would need heart team approach to consider high risk myectomy and CABG - clinical suspicion for restriction is low - clinical suspicion for PH is high  Tachycardia Persistent tachycardia with heart rate ranging from 89 to 125 bpm. No dangerous heart rhythms detected on heart monitor. Potential causes include pulmonary hypertension and fluid overload.  Essential hypertension Blood pressure is well-controlled with metoprolol . Previous issues with Bystolic  due to cost. Metoprolol  may be contributing to elevated heart rate. - Continue ARB for blood pressure control; resting obstruction is gone and with that his bendopnea has resolved  Pulmonary hypertension (suspected) Suspected pulmonary hypertension due to obstructive lung disease and lower extremity swelling. Potential contribution to tachycardia. - Will proceed with heart catheterization to assess for pulmonary hypertension. - Will coordinate with Doctor Hunsucker for further management based on catheterization results.  Chronic kidney disease Previous episodes of elevated creatinine levels. Current kidney function needs monitoring due to increased torsemide  dosage. - Increased torsemide  to 60 mg daily with an additional 20 mg as needed. - Will monitor kidney function with BMP prior to cath  Chronic obstructive pulmonary disease Tobacco use disorder COPD with reduced use of inhalers and nebulizer. Occasional use of prednisone  for symptom relief. Symptoms include mucus production and dry mouth. - Continue current inhaler and nebulizer regimen. - Will coordinate with Doctor Hunsucker for potential medication adjustments based on pulmonary function tests. - Discussed smoking cessation options and support. - Will consider nicotine  replacement therapy if interested (offered to increase his patch dose)  Morbid obesity Weight loss progress noted  with Ozempic . Current weight loss of approximately 8 pounds. - Continue Ozempic  for weight management.  HCM/AF Score 21; repeat heart monitor in 2027. Risk of SCD at 5 years 1.05% Recommendation - Based on the absence of risk factors, this patient does not have an indication for an ICD (Class 3 - No Benefit)   Symptom plan: - Hypertrophic obstructive cardiomyopathy Improvement in heart thickness and obstruction. No chest pain reported. Heart rate remains elevated. Risk of sudden cardiac death is low. Surgical intervention discussed but not feasible due to current health status. - Will proceed with heart catheterization to assess for significant blockages and potential surgical intervention. - continue mavacamten  as per protocol - I will do my best to not pursue ivabradine for heart rate suppresion  March f/u with me or Tessa   Time:   I have spent a total of 47 minutes with the patient reviewing notes, imaging, EKGs, labs,  and examining the patient as well as establishing an assessment and plan that was discussed personally with the patient. Discussed disease state education and his other co-morbidities at lenght   Stanly Leavens, MD FASE [facility] Cardiologist [facility]  [address] [place], [address] [place], [place] [address] [phone]  10:01 AM  "

## 2024-05-29 NOTE — Progress Notes (Signed)
 " Cardiology Office Note:  .    Date:  05/29/2024  ID:  [name] Slack, DOB February 28, 1956, MRN [id number] PCP: Arloa Jarvis, NP  [facility] Providers Cardiologist:  Lonni LITTIE Nanas, MD Electrophysiologist:  Soyla Gladis Norton, MD     CC: Follow up HCM Care  History of Present Illness: .    Joseph Harvey is a 69 y.o. male with hypertrophic obstructive cardiomyopathy and coronary artery disease who presents for evaluation of persistent tachycardia and fluid retention.  He experiences persistent tachycardia with heart rates ranging from 89 to 125 beats per minute, increasing upon standing and reaching up to 120 beats per minute later in the day. He feels better when his heart rate is lower, around 69 beats per minute. He is currently taking metoprolol  in the morning. His blood pressure is controlled but his heart rate remains elevated. No chest pain, but he reports shortness of breath that remains unchanged, although he feels better in the chest area. Physical activities like walking or lifting cause significant exertion. He uses oxygen at home during sleep and when engaging in activities that require exertion.  He experiences swelling in his feet, which fluctuates throughout the day. He uses ice packs to reduce the swelling, which provides some relief. He is currently on 40 mg of torsemide  daily and occasionally takes an extra dose for additional swelling. He avoids taking the medication when he needs to be away from home due to the diuretic effect and lack of bathroom access.  He has a history of coronary artery calcifications and has been unable to complete noninvasive tests due to elevated heart rates. A stress test in 2021 was normal, but calcium  testing for lung cancer screening showed plaque buildup in his arteries.  He has been losing weight with the help of Ozempic , having lost approximately 8 pounds recently. He also has a history of COPD and uses inhalers and a nebulizer as  needed, noting a decrease in his use recently. He occasionally takes prednisone  to help with breathing issues.  He has a significant smoking history and has been trying to reduce his cigarette consumption. Recent family stressors, including the death of his children's mother and other family members, have impacted his smoking habits.  Discussed the use of AI scribe software for clinical note transcription with the patient, who gave verbal consent to proceed.   Relevant histories: .  Social  - Brother: deceased due to heart problem - Sister: deceased due to heart problem - Two daughters who live in [place]; they are working on getting MYBC3 gene mutation testing at [facility] - his estranged children's mother passed suddenly ROS: As per HPI.   Studies Reviewed: .     Cardiac Studies & Procedures   ______________________________________________________________________________________________   STRESS TESTS  ECHOCARDIOGRAM STRESS TEST 10/12/2023  Narrative EXERCISE STRESS ECHO REPORT   --------------------------------------------------------------------------------  Patient Name:   Joseph Harvey  Date of Exam: 10/12/2023 Medical Rec #:  [id number]     Height:       69.0 in Accession #:    [id number]    Weight:       238.8 lb Date of Birth:  03/07/1956     BSA:          2.228 m Patient Age:    68 years      BP:           126/88 mmHg Patient Gender: M  HR:           101 bpm. Exam Location:  [facility]  Procedure: Cardiac Doppler, Color Doppler, Stress Echo and Limited Echo  Indications:    I42.1 HOCM  History:        Patient has prior history of Echocardiogram examinations, most recent 08/04/2023. Morbid Obesity, HOCM, GERD, Heart Failure with Preserved EF (HFpEF).  Sonographer:    Heather Hawks RDCS Referring Phys: DAPHNE LESCHES   Sonographer Comments: Image acquisition challenging due to patient body habitus, Image acquisition challenging due to  respiratory motion and patient has difficulty staying in a LLD position. [facility] DPR SIGNED ON 09/19/19. OK TO SPEAK TO BROTHER JONAH Hasty AND DAUGHTER NIKAY KELLY IMPRESSIONS   1. After repetitive squat and stand maneuver, LVOT gradient is 33 mmHG and MR is mild. 2. Patient did not exercise on the treadmill. 3. Resting Echo: LVEF >75% (hyperdynamic), systolic anterior motion of mitral valve with mild MR, mild TR, no AR or AS, normal aortic root and proximal ascending aorta size. Peak LVOT gradient at rest . 4. Non-diagnostic study for ischemia due to the study being performed for LVOT obstruction assessment.  FINDINGS  Exam Protocol: The patient exercised on a treadmill according to a HOCM Squat to Stand protocol. Patient Completed Resting/Valsalva Imaging, and a 10 Second Squat to Stand. Completed Testing with Gradients increasing above per protocol.   Patient Performance: The patient exercised for 0 minutes and 0 seconds,. The baseline heart rate was 107 bpm. The heart rate at peak stress was 126 bpm. The target heart rate was calculated to be 130 bpm. The percentage of maximum predicted heart rate achieved was 82.4 %. The baseline blood pressure was 114/72 mmHg. The blood pressure at peak stress was 112/77 mmHg. The blood pressure response was flat. The patient developed fatigue, leg fatigue and shortness of breath during the stress exam. The symptoms did not resolve. Shortness of breath at baseline, on 3L of O2.  EKG: Resting EKG showed sinus tachycardia with No stress EKG.   2D Echo Findings: This is a non-diagnostic study for ischemia due to the study being performed for LVOT obstruction assessment.   Sunit Tolia Electronically signed on 10/12/2023 at 7:40:16 PM      Final   ECHOCARDIOGRAM  ECHOCARDIOGRAM LIMITED 05/02/2024  Narrative ECHOCARDIOGRAM REPORT    Patient Name:   Joseph Harvey  Date of Exam: 05/02/2024 Medical Rec #:  [id number]     Height:        69.0 in Accession #:    [id number]    Weight:       249.0 lb Date of Birth:  Feb 25, 1956     BSA:          2.268 m Patient Age:    68 years      BP:           147/91 mmHg Patient Gender: M             HR:           125 bpm. Exam Location:  [address]  Procedure: Limited Echo, Cardiac Doppler and Limited Color Doppler (Both Spectral and Color Flow Doppler were utilized during procedure).  Indications:    I42.1 HOCM  History:        Patient has prior history of Echocardiogram examinations, most recent 03/21/2024. Hypertrophic Cardiomyopathy and CHF, COPD, Arrythmias:PVC, Signs/Symptoms:Shortness of Breath, Chest Pain, Dizziness/Lightheadedness, Edema, Fatigue and Murmur; Risk Factors:Hypertension, Diabetes, Dyslipidemia and Current Smoker. HOCM- Camzyos  (  4wk follow up after increase to 10mg - from 5mg ) Patient still says he is SOB, but does say it seems somewhat better overall. Obesity.  Sonographer:    Heather Hawks RDCS Referring Phys: [facility] A SANTO   Sonographer Comments: HOCM LTD- Camzyos  protocol with squat to stand performed at end. IMPRESSIONS   1. Left ventricular ejection fraction, by estimation, is 65 to 70%. The left ventricle has normal function. The left ventricle has no regional wall motion abnormalities. There is moderate asymmetric left ventricular hypertrophy of the septal segment. Left ventricular diastolic parameters are consistent with Grade I diastolic dysfunction (impaired relaxation). 2. Right ventricular systolic function is normal. The right ventricular size is normal. 3. The mitral valve is normal in structure. Trivial mitral valve regurgitation. No evidence of mitral stenosis. 4. The aortic valve is normal in structure. Aortic valve regurgitation is not visualized. No aortic stenosis is present. 5. The inferior vena cava is normal in size with greater than 50% respiratory variability, suggesting right atrial pressure of 3  mmHg.  Conclusion(s)/Recommendation(s): No significnt LVOT gradient at rest or with Valsalva or squand/stand provocative maneuvers. Peak LVOT gradient with Valsalva was .  FINDINGS Left Ventricle: Left ventricular ejection fraction, by estimation, is 65 to 70%. The left ventricle has normal function. The left ventricle has no regional wall motion abnormalities. The left ventricular internal cavity size was normal in size. There is moderate asymmetric left ventricular hypertrophy of the septal segment. Left ventricular diastolic parameters are consistent with Grade I diastolic dysfunction (impaired relaxation).  Right Ventricle: The right ventricular size is normal. No increase in right ventricular wall thickness. Right ventricular systolic function is normal.  Left Atrium: Left atrial size was normal in size.  Right Atrium: Right atrial size was normal in size.  Pericardium: There is no evidence of pericardial effusion.  Mitral Valve: The mitral valve is normal in structure. Trivial mitral valve regurgitation. No evidence of mitral valve stenosis.  Tricuspid Valve: The tricuspid valve is normal in structure. Tricuspid valve regurgitation is mild . No evidence of tricuspid stenosis.  Aortic Valve: The aortic valve is normal in structure. Aortic valve regurgitation is not visualized. No aortic stenosis is present.  Pulmonic Valve: The pulmonic valve was normal in structure. Pulmonic valve regurgitation is not visualized. No evidence of pulmonic stenosis.  Aorta: The aortic root is normal in size and structure.  Venous: The inferior vena cava is normal in size with greater than 50% respiratory variability, suggesting right atrial pressure of 3 mmHg.  IAS/Shunts: No atrial level shunt detected by color flow Doppler.   LEFT VENTRICLE PLAX 2D LVIDd:         4.40 cm   Diastology LVIDs:         2.10 cm   LV e' medial:    5.98 cm/s LV PW:         1.20 cm   LV E/e' medial:  7.2 LV IVS:         1.00 cm   LV e' lateral:   10.35 cm/s LVOT diam:     2.40 cm   LV E/e' lateral: 4.2 LV SV:         71 LV SV Index:   31 LVOT Area:     4.52 cm   RIGHT VENTRICLE RV Basal diam:  3.30 cm     PULMONARY VEINS RV S prime:     16.70 cm/s  A Reversal Velocity: 33.90 cm/s TAPSE (M-mode): 2.2 cm  Diastolic Velocity:  63.80 cm/s RVSP:           25.1 mmHg   S/D Velocity:        0.90 Systolic Velocity:   57.30 cm/s  LEFT ATRIUM           Index        RIGHT ATRIUM           Index LA diam:      3.60 cm 1.59 cm/m   RA Pressure: 3.00 mmHg LA Vol (A4C): 79.8 ml 35.19 ml/m  RA Area:     18.80 cm RA Volume:   58.70 ml  25.89 ml/m AORTIC VALVE LVOT Vmax:   112.00 cm/s LVOT Vmean:  73.300 cm/s LVOT VTI:    0.156 m  AORTA Ao Root diam: 3.40 cm Ao Asc diam:  3.30 cm  MITRAL VALVE               TRICUSPID VALVE MV Area (PHT)  cm         TR Peak grad:   22.1 mmHg MV Decel Time: 145 msec    TR Vmax:        235.00 cm/s MV E velocity: 42.97 cm/s  Estimated RAP:  3.00 mmHg MV A velocity: 73.53 cm/s  RVSP:           25.1 mmHg MV E/A ratio:  0.58 SHUNTS Systemic VTI:  0.16 m Systemic Diam: 2.40 cm  Toribio Fuel MD Electronically signed by Toribio Fuel MD Signature Date/Time: 05/02/2024/11:39:50 AM    Final    MONITORS  LONG TERM MONITOR-LIVE TELEMETRY (3-14 DAYS) 04/24/2024  Narrative   Patient had a minimum heart rate of 80 bpm, maximum heart rate of 194 bpm, and average heart rate of 113 bpm. Predominant underlying rhythm was sinus rhythm. Small NSVT burden, < 3 episodes and rate < 200 bpm. No atrial fibrillation or flutter Isolated PACs were rare (<1.0%). Isolated PVCs were occasional (2.1%). Triggered and diary events associated with sinus tachycardia.  No malignant arrhythmias of HCM.  Resting tachycardia.   CT SCANS  CT CARDIAC SCORING (SELF PAY ONLY) 04/29/2020  Addendum 05/03/2020 10:16 PM ADDENDUM REPORT: 05/03/2020 22:14  CLINICAL DATA:  Risk  stratification  EXAM: Coronary Calcium  Score  TECHNIQUE: The patient was scanned on a [employer] scanner. Axial non-contrast 3 mm slices were carried out through the heart. The data set was analyzed on a dedicated work station and scored using the Agatson method.  FINDINGS: Non-cardiac: See separate report from [facility].  Ascending Aorta: Normal  Pericardium: Normal  Coronary arteries: Calcium  score 117  IMPRESSION: Coronary calcium  score of 117. This was 81st percentile for age and sex matched control.   Electronically Signed By: Lonni Nanas MD On: 05/03/2020 22:14  Narrative EXAM: OVER-READ INTERPRETATION  CT CHEST  The following report is an over-read performed by radiologist Dr. Marcey Moan of [facility] on 04/29/2020. This over-read does not include interpretation of cardiac or coronary anatomy or pathology. The coronary calcium  score interpretation by the cardiologist is attached.  COMPARISON:  CTA of the chest on 07/24/2014  FINDINGS: Vascular: No significant vascular findings. Normal heart size. No pericardial effusion.  Mediastinum/Nodes: Visualized mediastinum and hilar regions demonstrate no lymphadenopathy or masses.  Lungs/Pleura: Visualized lungs show no evidence of pulmonary edema, consolidation, pneumothorax, nodule or pleural fluid.  Upper Abdomen: No acute abnormality.  Musculoskeletal: No chest wall mass or suspicious bone lesions identified.  IMPRESSION: No significant incidental findings.  Electronically  Signed: By: Marcey Moan M.D. On: 04/29/2020 09:28   CARDIAC MRI  MR CARDIAC MORPHOLOGY W WO CONTRAST 06/22/2020  Narrative CLINICAL DATA:  82M evaluate for HCM  EXAM: CARDIAC MRI  TECHNIQUE: The patient was scanned on a 1.5 Tesla Siemens magnet. A dedicated cardiac coil was used. Functional imaging was done using [employer] sequences. 2,3, and 4 chamber views were done to assess  for RWMA's. Modified Simpson's rule using a short axis stack was used to calculate an ejection fraction on a dedicated work [employer]. The patient received 10 cc of Gadavist . After 10 minutes inversion recovery sequences were used to assess for infiltration and scar tissue.  CONTRAST:  10 cc  of Gadavist   FINDINGS: Left ventricle:  -Asymmetric hypertrophy measuring up to 20mm in basal anteroseptum (8mm in posterior wall)  -Normal size  -Normal systolic function  -Mild ECV elevation (29%)  -Patchy LGE in basal septum and RV insertion sites. LGE accounts for 11% of total myocardial mass  LV EF: 63% (Normal 56-78%)  Absolute volumes:  LV EDV: (Normal 77-195 mL)  LV ESV: 43mL (Normal 19-72 mL)  LV SV: 73mL (Normal 51-133 mL)  CO: 6.2L/min (Normal 2.8-8.8 L/min)  Indexed volumes:  LV EDV: 82mL/sq-m (Normal 47-92 mL/sq-m)  LV ESV: 52mL/sq-m (Normal 13-30 mL/sq-m)  LV SV: 15mL/sq-m (Normal 32-62 mL/sq-m)  CI: 2.8L/min/sq-m (Normal 1.7-4.2 L/min/sq-m)  Right ventricle: Normal size and systolic function  RV EF:  63% (Normal 47-74%)  Absolute volumes:  RV EDV: (Normal 88-227 mL)  RV ESV: 50mL (Normal 23-103 mL)  RV SV: 82mL (Normal 52-138 mL)  CO: 7.0L/min (Normal 2.8-8.8 L/min)  Indexed volumes:  RV EDV: 16mL/sq-m (Normal 55-105 mL/sq-m)  RV ESV: 55mL/sq-m (Normal 15-43 mL/sq-m)  RV SV: 58mL/sq-m (Normal 32-64 mL/sq-m)  CI: 3.1L/min/sq-m (Normal 1.7-4.2 L/min/sq-m)  Left atrium: Mild enlargement  Right atrium: Normal size  Mitral valve: No regurgitation  Aortic valve: No regurgitation  Tricuspid valve: No regurgitation  Pulmonic valve: No regurgitation  Aorta: Normal proximal ascending aorta  Pericardium: Normal  IMPRESSION: 1. Asymmetric hypertrophy measuring up to 20mm in basal anteroseptum (8mm in posterior wall), consistent with hypertrophic cardiomyopathy  2. Patchy LGE in basal septum and RV  insertion sites, consistent with HCM. LGE accounts for 11% of total myocardial mass  3. Normal LV size and systolic function (EF 63%)  4. Normal RV size and systolic function (EF 63%)   Electronically Signed By: Lonni Nanas MD On: 06/23/2020 22:42   ______________________________________________________________________________________________        Physical Exam:    VS:  BP 128/80   Pulse (!) 125   Ht 5' 9 (1.753 m)   Wt 241 lb (109.3 kg)   SpO2 98%   BMI 35.59 kg/m    Wt Readings from Last 3 Encounters:  05/29/24 241 lb (109.3 kg)  04/03/24 249 lb (112.9 kg)  01/04/24 249 lb (112.9 kg)    Gen: no distress, morbid obesity   Neck: No JVD Cardiac: No Rubs or Gallops, systolic murmur, regular tachycardia, +2 radial pulses Respiratory: Bilateral inspiratory wheezes bilaterally, normal effort, normal  respiratory rate GI: Soft, nontender, non-distended  MS: +2 ankle edema;  moves all extremities Integument: Skin feels warm Neuro:  At time of evaluation, alert and oriented to person/place/time/situation  Psych: Normal affect, patient feels ok  ASSESSMENT AND PLAN: .    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 Previous stress test in 2021 was normal. Non-invasive tests for significant blockage disease were not completed due to elevated heart rate (CCTA and CPET) - Will proceed with heart catheterization to assess for significant blockages.  Risks and benefits of cardiac catheterization have been discussed with the patient.  These include bleeding, infection, kidney damage, stroke, heart attack, death.  The patient understands these risks and is willing to  proceed.  Access recommendations: R radial, R AC Procedural considerations if significant CAD, would need heart team approach to consider high risk myectomy and CABG - clinical suspicion for restriction is low - clinical suspicion for PH is high  Tachycardia Persistent tachycardia with heart rate ranging from 89 to 125 bpm. No dangerous heart rhythms detected on heart monitor. Potential causes include pulmonary hypertension and fluid overload.  Essential hypertension Blood pressure is well-controlled with metoprolol . Previous issues with Bystolic  due to cost. Metoprolol  may be contributing to elevated heart rate. - Continue ARB for blood pressure control; resting obstruction is gone and with that his bendopnea has resolved  Pulmonary hypertension (suspected) Suspected pulmonary hypertension due to obstructive lung disease and lower extremity swelling. Potential contribution to tachycardia. - Will proceed with heart catheterization to assess for pulmonary hypertension. - Will coordinate with Doctor Hunsucker for further management based on catheterization results.  Chronic kidney disease Previous episodes of elevated creatinine levels. Current kidney function needs monitoring due to increased torsemide  dosage. - Increased torsemide  to 60 mg daily with an additional 20 mg as needed. - Will monitor kidney function with BMP prior to cath  Chronic obstructive pulmonary disease Tobacco use disorder COPD with reduced use of inhalers and nebulizer. Occasional use of prednisone  for symptom relief. Symptoms include mucus production and dry mouth. - Continue current inhaler and nebulizer regimen. - Will coordinate with Doctor Hunsucker for potential medication adjustments based on pulmonary function tests. - Discussed smoking cessation options and support. - Will consider nicotine  replacement therapy if interested (offered to increase his patch dose)  Morbid obesity Weight loss progress noted  with Ozempic . Current weight loss of approximately 8 pounds. - Continue Ozempic  for weight management.  HCM/AF Score 21; repeat heart monitor in 2027. Risk of SCD at 5 years 1.05% Recommendation - Based on the absence of risk factors, this patient does not have an indication for an ICD (Class 3 - No Benefit)   Symptom plan: - Hypertrophic obstructive cardiomyopathy Improvement in heart thickness and obstruction. No chest pain reported. Heart rate remains elevated. Risk of sudden cardiac death is low. Surgical intervention discussed but not feasible due to current health status. - Will proceed with heart catheterization to assess for significant blockages and potential surgical intervention. - continue mavacamten  as per protocol - I will do my best to not pursue ivabradine for heart rate suppresion  March f/u with me or Tessa   Time:   I have spent a total of 47 minutes with the patient reviewing notes, imaging, EKGs, labs,  and examining the patient as well as establishing an assessment and plan that was discussed personally with the patient. Discussed disease state education and his other co-morbidities at lenght   Stanly Leavens, MD FASE [facility] Cardiologist [facility]  [address] [place], [address] [place], [place] [address] [phone]  10:01 AM  "

## 2024-05-29 NOTE — Patient Instructions (Signed)
 Medication Instructions:  Your physician has recommended you make the following change in your medication:   INCREASE: torsemide  to 60 mg by mouth once daily; you may take an additional 20 mg once daily as needed for swelling  *If you need a refill on your cardiac medications before your next appointment, please call your pharmacy*  Lab Work: ON JAN 23 at any [facility]: BMP and CBC  If you have labs (blood work) drawn today and your tests are completely normal, you will receive your results only by: [employer] (if you have MyChart) OR A paper copy in the mail If you have any lab test that is abnormal or we need to change your treatment, we will call you to review the results.  Testing/Procedures: Your physician has requested that you have a cardiac catheterization. Cardiac catheterization is used to diagnose and/or treat various heart conditions. Doctors may recommend this procedure for a number of different reasons. The most common reason is to evaluate chest pain. Chest pain can be a symptom of coronary artery disease (CAD), and cardiac catheterization can show whether plaque is narrowing or blocking your hearts arteries. This procedure is also used to evaluate the valves, as well as measure the blood flow and oxygen levels in different parts of your heart. For further information please visit [email or web address] Please follow instruction sheet, as given.   Follow-Up:As scheduled  At [facility], you and your health needs are our priority.  As part of our continuing mission to provide you with exceptional heart care, our providers are all part of one team.  This team includes your primary Cardiologist (physician) and Advanced Practice Providers or APPs (Physician Assistants and Nurse Practitioners) who all work together to provide you with the care you need, when you need it.   Provider:   Stanly Leavens, MD     Other Instructions   You are scheduled for a Cardiac  Catheterization on Friday, January 30 with Dr. Lurena Red.  1. Please arrive at the [facility] (Main Entrance A) at [facility]: [address] [place], [place] [address] at 9:30 AM (This time is 2 hour(s) before your procedure to ensure your preparation).   Free valet parking service is available. You will check in at ADMITTING. The support person will be asked to wait in the waiting room.  It is OK to have someone drop you off and come back when you are ready to be discharged.    Special note: Every effort is made to have your procedure done on time. Please understand that emergencies sometimes delay scheduled procedures.  2. Diet: Nothing to eat after midnight.   3. Hydration: On January 30, you may drink approved liquids (see below) until 2 hours before the procedure with 8 oz of water as your last intake.   List of approved liquids water, clear juice, clear tea, black coffee, fruit juices, non-citric and without pulp, carbonated beverages, Gatorade, Kool -Aid, plain Jello-O and plain ice popsicles.  4. Labs: You will need to have blood drawn on Friday, January 23 at any [facility]. You do not need to be fasting.  5. Medication instructions in preparation for your procedure:   Contrast Allergy : No   DO NOT TAKE Semaglutide  (Ozempic ) 1 week before Heart Cath (DO NOT TAKE ON SATURDAY JAN 24 TH)  DO NOT TAKE Empagliflozin  (Jardiance ) 3 days prior to Heart Cath (LAST DOSE WILL BE MONDAY JAN 26)  DO NOT TAKE THE FOLLOWING MEDICATIONS THE  DAY BEFORE AND DAY OF HEART CATH:   Potassium Chloride   Torsemide   Valsartan      Do not take Diabetes Med Glucophage  (Metformin ) on the day of the procedure and HOLD 48 HOURS AFTER THE PROCEDURE.  On the morning of your procedure, take your Aspirin  81 mg and any morning medicines NOT listed above.  You may use sips of water.  6. Plan to go home the same day, you will only stay overnight if medically necessary. 7. Bring a current list of  your medications and current insurance cards. 8. You MUST have a responsible person to drive you home. 9. Someone MUST be with you the first 24 hours after you arrive home or your discharge will be delayed. 10. Please wear clothes that are easy to get on and off and wear slip-on shoes.  Thank you for allowing us  to care for you!   -- [facility]

## 2024-06-03 ENCOUNTER — Telehealth: Payer: Self-pay

## 2024-06-03 NOTE — Progress Notes (Unsigned)
 "     SLEEP OFFICE VISIT NOTE:    Date:  06/04/2024   ID:  Joseph Harvey, DOB 07-12-1955, MRN [id number]   PCP:  Arloa Jarvis, NP  Cardiologist:  Lonni LITTIE Nanas, MD     Chief Complaint  Patient presents with   Sleep Apnea   Hypertension    History of Present Illness:    Joseph Harvey is a 69 y.o. male with a hx of asthma, prostate CA, CHF, DM, GERD, HTN, HLD as well as OSA on CPAP.    He had been followed by Dr. Burnard in the past but is now referred to establish sleep care with me upon Dr. Joesphine retirement.    He initially underwent a HST in 2022 revealing severe sleep apnea with an overall AHI at 37.6/h. He had absent supine sleep on that study. There was significant oxygen desaturation to nadir 74%. He subsequently underwent a CPAP titration on December 28, 2020 and was titrated up to  10 cm of water. AHI was still elevated at 10.8 and O2 nadir was 89%. As result it was recommended he initiate CPAP auto therapy at a range of 11 to 20 cm of water. He received a new ResMed air sense 11 AutoSet unit on January 19, 2021 with [facility] as his DME company. His device was ultimately changed to 11-16cm H2O.   He is doing well with his PAP device.  He tolerates the nasal pillow mask and feels the pressure is adequate.  He uses O2 3L at night with his PAP. He feels rested in the am and has no significant daytime sleepiness. He sometimes will doze off when sitting.  He does have some mouth and nasal dryness.   He does not think that he snores. An Epworth Sleepiness Scale score was calculated the office today and this endorsed at 3 arguing against residual daytime sleepiness. Patient denies any episodes of bruxism, No hypnogognic hallucinations or cataplectic events.  He has significant problems with restless legs related to prior spine problems.   Past Medical History:  Diagnosis Date   Allergy     Dust, mold, dust mites   Anemia    Anxiety    Asthma    Cancer (HCC)    prostate    Cataract    bilateral repair.   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   Pneumonia    Stress incontinence     Past Surgical History:  Procedure Laterality Date   BACK SURGERY  2017   CATARACT EXTRACTION Bilateral    COLONOSCOPY     2013   COLONOSCOPY  10/03/2019   KNEE ARTHROSCOPY  2005   right   PROSTATE BIOPSY     PROSTATE SURGERY     PVC ABLATION N/A 08/10/2021   Procedure: PVC ABLATION;  Surgeon: Inocencio Soyla Lunger, MD;  Location: [facility];  Service: Cardiovascular;  Laterality: N/A;   ROBOT ASSISTED LAPAROSCOPIC RADICAL PROSTATECTOMY  03/2010    Current Medications: Active Medications[1]   Allergies:   Lisinopril, Molds & smuts, and Dust mite extract   Social History   Socioeconomic History   Marital status: Single    Spouse name: Not on file   Number of children: 2   Years  of education: 12 grade   Highest education level: Not on file  Occupational History   Occupation: Facilities Manager: [employer] ([employer]  Tobacco Use   Smoking status: Every Day    Current packs/day: 0.75    Average packs/day: 0.8 packs/day for 50.1 years (37.5 ttl pk-yrs)    Types: Cigarettes    Start date: 1976   Smokeless tobacco: Never   Tobacco comments:    still smoking 0.5 ppd  Vaping Use   Vaping status: Never Used  Substance and Sexual Activity   Alcohol use: Not Currently    Alcohol/week: 3.0 standard drinks of alcohol    Types: 3 Cans of beer per week   Drug use: No   Sexual activity: Not on file  Other Topics Concern   Not on file  Social History Narrative   Not on file   Social Drivers of Health   Tobacco Use: High Risk (06/04/2024)   Patient History    Smoking Tobacco Use: Every Day    Smokeless Tobacco Use: Never    Passive Exposure: Not on  file  Financial Resource Strain: Low Risk (04/30/2024)   Received from [facility]   Overall Financial Resource Strain (CARDIA)    How hard is it for you to pay for the very basics like food, housing, medical care, and heating?: Not hard at all  Food Insecurity: No Food Insecurity (04/30/2024)   Received from [facility]   Epic    Within the past 12 months, you worried that your food would run out before you got the money to buy more.: Never true    Within the past 12 months, the food you bought just didn't last and you didn't have money to get more.: Never true  Transportation Needs: No Transportation Needs (04/30/2024)   Received from [facility]    In the past 12 months, has lack of transportation kept you from medical appointments or from getting medications?: No    In the past 12 months, has lack of transportation kept you from meetings, work, or from getting things needed for daily living?: No  Physical Activity: Not on file  Stress: Not on file  Social Connections: Unknown (09/07/2023)   Social Connection and Isolation Panel    Frequency of Communication with Friends and Family: Three times a week    Frequency of Social Gatherings with Friends and Family: Twice a week    Attends Religious Services: Patient declined    Active Member of Clubs or Organizations: No    Attends Banker Meetings: Never    Marital Status: Never married  Depression (PHQ2-9): Low Risk (02/16/2023)   Depression (PHQ2-9)    PHQ-2 Score: 0  Alcohol Screen: Low Risk (02/16/2023)   Alcohol Screen    Last Alcohol Screening Score (AUDIT): 0  Housing: Low Risk (04/30/2024)   Received [facility]    In the last 12 months, was there a time when you were not able to pay the mortgage or rent on time?: No    In the past 12 months, how many times have you moved where you were living?: 0    At any time in the past 12 months, were you homeless or living in a shelter (including  now)?: No  Utilities: Not At Risk (04/30/2024)   Received from [facility]    In the past 12 months has the electric, gas, oil, or water  company threatened to shut off services in your home?: No  Health Literacy: Not on file     Family History: The patient's family history includes Allergies in his brother; Asthma in his mother; Colon cancer in his father; Diabetes in his maternal grandmother; Lung cancer in his father and mother; Prostate cancer in his father. There is no history of Colon polyps, Esophageal cancer, Stomach cancer, or Rectal cancer.  ROS:   Please see the history of present illness.    ROS  All other systems reviewed and negative.   EKGs/Labs/Other Studies Reviewed:    The following studies were reviewed today: PAP compliance download   Physical Exam:    VS:  BP 114/74   Pulse (!) 113   Ht 5' 9 (1.753 m)   Wt 240 lb (108.9 kg)   SpO2 99%   BMI 35.44 kg/m     Wt Readings from Last 3 Encounters:  06/04/24 240 lb (108.9 kg)  05/29/24 241 lb (109.3 kg)  04/03/24 249 lb (112.9 kg)     GEN: Well nourished, well developed in no acute distress HEENT: Normal NECK: No JVD; No carotid bruits LYMPHATICS: No lymphadenopathy CARDIAC:RRR, no murmurs, rubs, gallops RESPIRATORY:  scattered wheezes ABDOMEN: Soft, non-tender, non-distended MUSCULOSKELETAL:  No edema; No deformity  SKIN: Warm and dry NEUROLOGIC:  Alert and oriented x 3 PSYCHIATRIC:  Normal affect   ASSESSMENT:    1. OSA (obstructive sleep apnea)   2. Essential hypertension    PLAN:    In order of problems listed above:  OSA - The patient is tolerating PAP therapy well without any problems. The PAP download performed by his DME was personally reviewed and interpreted by me today and showed an AHI of 1.1/hr on auto CPAP from 11-18 cm H2O with 40% compliance in using more than 4 hours nightly.  The patient has been using and benefiting from PAP use and will continue to benefit from  therapy.  -he was in the hospital several times over the past year resulting in decreased compliance. -encouraged him to be more compliant with his device -his average usage is 5 hours.  HTN -BP controlled on exam today -continue Toprol  XL 200mg  daily with PRN refills   Time Spent: 20 minutes total time of encounter, including 15 minutes spent in face-to-face patient care on the date of this encounter. This time includes coordination of care and counseling regarding above mentioned problem list. Remainder of non-face-to-face time involved reviewing chart documents/testing relevant to the patient encounter and documentation in the medical record. I have independently reviewed documentation from referring provider  Medication Adjustments/Labs and Tests Ordered: Current medicines are reviewed at length with the patient today.  Concerns regarding medicines are outlined above.  No orders of the defined types were placed in this encounter.  No orders of the defined types were placed in this encounter.   Signed, Wilbert Bihari, MD  06/04/2024 3:11 PM    [facility]     [1]  Current Meds  Medication Sig   acetaminophen  (TYLENOL ) 500 MG tablet Take 500-1,000 mg by mouth every 6 (six) hours as needed for mild pain (pain score 1-3) or moderate pain (pain score 4-6).   albuterol  (PROVENTIL ) (2.5 MG/3ML) 0.083% nebulizer solution TAKE 3 MLS BY NEBULIZATION EVERY 4 HOURS AS NEEDED FOR WHEEZING OR SHORTNESS OF BREATH (((PLAN B))) (Patient taking differently: Take 3 mLs by nebulization every 4 (four) hours as needed for wheezing or shortness of breath.)  albuterol  (VENTOLIN  HFA) 108 (90 Base) MCG/ACT inhaler Inhale 2 puffs into the lungs every 4 hours as needed for shortness of breath or wheezing   ASPIRIN  81 PO Take 81 mg by mouth daily.   Budeson-Glycopyrrol-Formoterol  (BREZTRI  AEROSPHERE) 160-9-4.8 MCG/ACT AERO Inhale 2 puffs into the lungs in the morning and at bedtime.    busPIRone  (BUSPAR ) 10 MG tablet Take 1 tablet (10 mg total) by mouth as needed (anxiety). (Patient taking differently: Take 15 mg by mouth as needed (anxiety).)   calcitonin, salmon, (MIACALCIN /FORTICAL) 200 UNIT/ACT nasal spray Place 1 spray into alternate nostrils daily.   cetirizine (ZYRTEC) 10 MG tablet Take 10 mg by mouth every morning.   Cholecalciferol  (VITAMIN D PO) Take 1 tablet by mouth daily.   clotrimazole-betamethasone (LOTRISONE) cream Apply 1 Application topically daily as needed.   cyclobenzaprine  (FLEXERIL ) 10 MG tablet Take 1 tablet (10 mg total) by mouth 3 (three) times daily as needed for muscle spasms.   dextromethorphan -guaiFENesin  (MUCINEX  DM) 30-600 MG 12hr tablet Take 1 tablet by mouth 2 (two) times daily. (Patient taking differently: Take 1 tablet by mouth as needed for cough.)   empagliflozin  (JARDIANCE ) 10 MG TABS tablet Take 1 tablet (10 mg total) by mouth daily.   ezetimibe  (ZETIA ) 10 MG tablet Take 1 tablet (10 mg total) by mouth every evening.   famotidine  (PEPCID ) 20 MG tablet Take 20-40 mg by mouth daily as needed for heartburn or indigestion.   gabapentin  (NEURONTIN ) 300 MG capsule Take 1 capsule (300 mg total) by mouth daily as needed (Nerve pain).   glucose blood (TRUE METRIX BLOOD GLUCOSE TEST) test strip Use as instructed   ipratropium (ATROVENT ) 0.06 % nasal spray Place 2 sprays into both nostrils 3 (three) times daily. As needed for nasal congestion, runny nose   ipratropium-albuterol  (DUONEB) 0.5-2.5 (3) MG/3ML SOLN INHALE 1 VIAL VIA NEBULIZER TWICE A DAY   LINZESS  145 MCG CAPS capsule Take 145 mcg by mouth daily.   MAGnesium -Oxide 400 (240 Mg) MG tablet Take 1 tablet (400 mg total) by mouth daily.   mavacamten  (CAMZYOS ) 10 MG CAPS capsule Take 1 capsule (10 mg total) by mouth daily.   metFORMIN  (GLUCOPHAGE -XR) 500 MG 24 hr tablet Take 2 tablets (1,000 mg total) by mouth in the morning AND 1 tablet (500 mg total) every evening with meals.   metoprolol   (TOPROL  XL) 200 MG 24 hr tablet Take 1 tablet (200 mg total) by mouth daily.   montelukast  (SINGULAIR ) 10 MG tablet Take 1 tablet (10 mg total) by mouth at bedtime as needed (allergies).   nicotine  (NICODERM CQ  - DOSED IN MG/24 HR) 7 mg/24hr patch PLACE 1 PATCH (7 MG TOTAL) ONTO THE SKIN DAILY.   OXYGEN Inhale 3 L into the lungs See admin instructions. Inhale 3L when needed unless overexertion, then use continuously.   potassium chloride  SA (KLOR-CON  M) 20 MEQ tablet Take 2 tablets (40 mEq total) by mouth daily.   predniSONE  (DELTASONE ) 10 MG tablet TAKE 3 TABLETS (30 MG TOTAL) BY MOUTH DAILY AS NEEDED.   rosuvastatin  (CRESTOR ) 20 MG tablet Take 1 tablet (20 mg total) by mouth daily.   Semaglutide  (OZEMPIC , 0.25 OR 0.5 MG/DOSE, [place]) Inject into the skin.   silver  sulfADIAZINE  (SILVADENE ) 1 % cream Apply 1 Application topically as needed (rash).   traMADol  (ULTRAM ) 50 MG tablet Take 1 tablet (50 mg total) by mouth every evening. Each prescription to last 1 mth (Patient taking differently: Take 50 mg by mouth every evening.)   triamcinolone  (  KENALOG ) 0.025 % cream Apply 1 Application topically daily.   TRUEplus Lancets 28G MISC Use as directed   valsartan  (DIOVAN ) 160 MG tablet Take 1 tablet (160 mg total) by mouth daily.   "

## 2024-06-03 NOTE — Telephone Encounter (Signed)
 Joseph Harvey

## 2024-06-04 ENCOUNTER — Ambulatory Visit: Attending: Cardiology | Admitting: Cardiology

## 2024-06-04 ENCOUNTER — Encounter: Payer: Self-pay | Admitting: Cardiology

## 2024-06-04 VITALS — BP 114/74 | HR 113 | Ht 69.0 in | Wt 240.0 lb

## 2024-06-04 DIAGNOSIS — I10 Essential (primary) hypertension: Secondary | ICD-10-CM

## 2024-06-04 DIAGNOSIS — G4733 Obstructive sleep apnea (adult) (pediatric): Secondary | ICD-10-CM | POA: Diagnosis not present

## 2024-06-04 NOTE — Patient Instructions (Signed)

## 2024-06-05 ENCOUNTER — Ambulatory Visit: Payer: Self-pay

## 2024-06-05 LAB — CBC
Hematocrit: 43.4 % (ref 37.5–51.0)
Hemoglobin: 14.1 g/dL (ref 13.0–17.7)
MCH: 30.9 pg (ref 26.6–33.0)
MCHC: 32.5 g/dL (ref 31.5–35.7)
MCV: 95 fL (ref 79–97)
Platelets: 379 x10E3/uL (ref 150–450)
RBC: 4.57 x10E6/uL (ref 4.14–5.80)
RDW: 12.7 % (ref 11.6–15.4)
WBC: 8.4 x10E3/uL (ref 3.4–10.8)

## 2024-06-05 LAB — BASIC METABOLIC PANEL WITH GFR
BUN/Creatinine Ratio: 15 (ref 10–24)
BUN: 23 mg/dL (ref 8–27)
CO2: 32 mmol/L — AB (ref 20–29)
Calcium: 9.7 mg/dL (ref 8.6–10.2)
Chloride: 93 mmol/L — AB (ref 96–106)
Creatinine, Ser: 1.49 mg/dL — AB (ref 0.76–1.27)
Glucose: 140 mg/dL — AB (ref 70–99)
Potassium: 3.9 mmol/L (ref 3.5–5.2)
Sodium: 143 mmol/L (ref 134–144)
eGFR: 51 mL/min/1.73 — AB

## 2024-06-06 ENCOUNTER — Telehealth: Payer: Self-pay | Admitting: *Deleted

## 2024-06-06 DIAGNOSIS — I2510 Atherosclerotic heart disease of native coronary artery without angina pectoris: Secondary | ICD-10-CM

## 2024-06-06 DIAGNOSIS — G4733 Obstructive sleep apnea (adult) (pediatric): Secondary | ICD-10-CM

## 2024-06-06 DIAGNOSIS — R000 Tachycardia, unspecified: Secondary | ICD-10-CM

## 2024-06-06 NOTE — Telephone Encounter (Signed)
-----   Message from Wilbert Bihari, MD sent at 06/04/2024  3:11 PM EST ----- Patient would like to change DME to [facility]

## 2024-06-06 NOTE — Addendum Note (Signed)
 Addended by: JOSHUA BRAD MATSU on: 06/06/2024 10:53 AM   Modules accepted: Orders

## 2024-06-06 NOTE — Telephone Encounter (Addendum)
 Order new PAP supplies.   Order placed to [facility] via community message.

## 2024-06-06 NOTE — Telephone Encounter (Signed)
 Patient wants to change dme's from [facility] to [facility]. Order placed to [facility] via community message.

## 2024-06-07 ENCOUNTER — Encounter: Payer: Self-pay | Admitting: Gastroenterology

## 2024-06-07 ENCOUNTER — Ambulatory Visit: Admitting: Gastroenterology

## 2024-06-07 ENCOUNTER — Telehealth: Payer: Self-pay

## 2024-06-07 VITALS — BP 140/72 | HR 82 | Ht 69.0 in | Wt 250.4 lb

## 2024-06-07 DIAGNOSIS — Z09 Encounter for follow-up examination after completed treatment for conditions other than malignant neoplasm: Secondary | ICD-10-CM

## 2024-06-07 DIAGNOSIS — Z9981 Dependence on supplemental oxygen: Secondary | ICD-10-CM | POA: Insufficient documentation

## 2024-06-07 DIAGNOSIS — Z860100 Personal history of colon polyps, unspecified: Secondary | ICD-10-CM | POA: Diagnosis not present

## 2024-06-07 NOTE — Progress Notes (Signed)
 "    06/07/2024 Joseph Harvey [id number] 1955-10-23   Discussed the use of AI scribe software for clinical note transcription with the patient, who gave verbal consent to proceed.  History of Present Illness Joseph Harvey is a 69 year old male with CHF and O2 dependency who presents for preprocedural evaluation prior to colonoscopy.  Has history of polyps and is overdue for surveillance colonoscopy.  He is a patient of Dr. Trenna.  He has an upcoming cardiac catheterization in one week.  Visit was brief as patient seemed somewhat upset about the appt/need for the appt.  Colonoscopy 09/2019: - Two 8 to 12 mm polyps in the sigmoid colon and in the ascending colon, removed with a cold snare. Resected and retrieved. - Non- bleeding internal hemorrhoids. - The examination was otherwise normal.  Surgical [P], colon, sigmoid and ascending, polyp (2) - TUBULAR ADENOMA(S). - HIGH GRADE DYSPLASIA IS NOT IDENTIFIED.  Repeat in 3 years.  Past Medical History:  Diagnosis Date   Allergy     Dust, mold, dust mites   Anemia    Anxiety    Asthma    Cancer (HCC)    prostate   Cataract    bilateral repair.   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 He started smoking about 50 years ago. He has a 37.5 pack-year smoking history. He has never used smokeless tobacco. He reports that he does not currently use alcohol after a past usage of about 3.0 standard drinks of alcohol per week. He reports that he does not use drugs. family history includes Allergies in his brother; Asthma in his mother; Colon cancer in his father; Diabetes in his maternal grandmother; Lung cancer in his father and mother; Prostate cancer in his father. Allergies[1]    Outpatient Encounter Medications as of 06/07/2024  Medication Sig   acetaminophen  (TYLENOL ) 500 MG tablet Take 500-1,000 mg by mouth every 6 (six) hours as needed for mild pain (pain score 1-3) or moderate pain (pain score 4-6).   albuterol  (PROVENTIL ) (2.5 MG/3ML) 0.083% nebulizer solution TAKE 3 MLS BY NEBULIZATION EVERY 4 HOURS AS NEEDED FOR WHEEZING OR SHORTNESS OF BREATH (((PLAN B))) (Patient taking differently: Take 3 mLs by nebulization every 4 (four) hours as needed for wheezing or shortness of breath.)   albuterol  (VENTOLIN  HFA) 108 (90 Base) MCG/ACT inhaler Inhale 2 puffs into the lungs every 4 hours as needed for shortness of breath or wheezing   ASPIRIN  81 PO Take 81 mg by mouth daily.   Budeson-Glycopyrrol-Formoterol  (BREZTRI  AEROSPHERE) 160-9-4.8 MCG/ACT AERO Inhale 2 puffs into the lungs in the  morning and at bedtime.   busPIRone  (BUSPAR ) 10 MG tablet Take 1 tablet (10 mg total) by mouth as needed (anxiety).   busPIRone  (BUSPAR ) 15 MG tablet Take 15 mg by mouth 3 (three) times daily as needed (anxiety).   cetirizine (ZYRTEC) 10 MG tablet Take 10 mg by mouth every morning.   Cholecalciferol  (VITAMIN D PO) Take 1 tablet by mouth daily.   clotrimazole-betamethasone (LOTRISONE) cream Apply 1 Application topically daily as needed (irritation).   cyclobenzaprine  (FLEXERIL ) 10 MG tablet Take 1 tablet (10 mg total) by mouth 3 (three) times daily as needed for muscle spasms.    dextromethorphan -guaiFENesin  (MUCINEX  DM) 30-600 MG 12hr tablet Take 1 tablet by mouth 2 (two) times daily. (Patient taking differently: Take 1 tablet by mouth 2 (two) times daily as needed for cough.)   doxycycline  (VIBRAMYCIN ) 100 MG capsule Take 100 mg by mouth 2 (two) times daily.   empagliflozin  (JARDIANCE ) 10 MG TABS tablet Take 1 tablet (10 mg total) by mouth daily.   ezetimibe  (ZETIA ) 10 MG tablet Take 1 tablet (10 mg total) by mouth every evening.   gabapentin  (NEURONTIN ) 300 MG capsule Take 1 capsule (300 mg total) by mouth daily as needed (Nerve pain). (Patient taking differently: Take 300 mg by mouth 3 (three) times daily as needed (Nerve pain).)   glucose blood (TRUE METRIX BLOOD GLUCOSE TEST) test strip Use as instructed   ipratropium (ATROVENT ) 0.06 % nasal spray Place 2 sprays into both nostrils 3 (three) times daily. As needed for nasal congestion, runny nose   ipratropium-albuterol  (DUONEB) 0.5-2.5 (3) MG/3ML SOLN INHALE 1 VIAL VIA NEBULIZER TWICE A DAY (Patient taking differently: Take 3 mLs by nebulization 2 (two) times daily as needed (wheezing).)   LINZESS  145 MCG CAPS capsule Take 145 mcg by mouth daily.   MAGnesium -Oxide 400 (240 Mg) MG tablet Take 1 tablet (400 mg total) by mouth daily.   mavacamten  (CAMZYOS ) 10 MG CAPS capsule Take 1 capsule (10 mg total) by mouth daily.   metFORMIN  (GLUCOPHAGE -XR) 500 MG 24 hr tablet Take 2 tablets (1,000 mg total) by mouth in the morning AND 1 tablet (500 mg total) every evening with meals.   metoprolol  (TOPROL  XL) 200 MG 24 hr tablet Take 1 tablet (200 mg total) by mouth daily.   metoprolol  succinate (TOPROL -XL) 100 MG 24 hr tablet Take 100 mg by mouth daily as needed (a-fib). Take with or immediately following a meal.   montelukast  (SINGULAIR ) 10 MG tablet Take 1 tablet (10 mg total) by mouth at bedtime as needed (allergies).   nicotine  (NICODERM CQ  - DOSED IN MG/24 HOURS) 14 mg/24hr patch Place 14 mg onto the skin daily.   nicotine   (NICODERM CQ  - DOSED IN MG/24 HR) 7 mg/24hr patch PLACE 1 PATCH (7 MG TOTAL) ONTO THE SKIN DAILY.   NON FORMULARY Pt uses a cpap nightly   OXYGEN Inhale 3 L into the lungs See admin instructions. Inhale 3L when needed unless and continuously while sleeping   potassium chloride  SA (KLOR-CON  M) 20 MEQ tablet Take 2 tablets (40 mEq total) by mouth daily.   predniSONE  (DELTASONE ) 10 MG tablet TAKE 3 TABLETS (30 MG TOTAL) BY MOUTH DAILY AS NEEDED.   rosuvastatin  (CRESTOR ) 20 MG tablet Take 1 tablet (20 mg total) by mouth daily.   Semaglutide ,0.25 or 0.5MG /DOS, 2 MG/3ML SOPN Inject 0.25 mg into the skin every Saturday.   silver  sulfADIAZINE  (SILVADENE ) 1 % cream Apply 1 Application topically as needed (rash).   torsemide  (DEMADEX ) 20  MG tablet Take 3 tablets (60 mg total) by mouth daily. May take an additional 20 mg once daily as needed for swelling   traMADol  (ULTRAM ) 50 MG tablet Take 1 tablet (50 mg total) by mouth every evening. Each prescription to last 1 mth (Patient taking differently: Take 50 mg by mouth 3 (three) times daily as needed for moderate pain (pain score 4-6).)   triamcinolone  (KENALOG ) 0.025 % cream Apply 1 Application topically daily as needed (itching).   TRUEplus Lancets 28G MISC Use as directed   valsartan  (DIOVAN ) 160 MG tablet Take 1 tablet (160 mg total) by mouth daily.   No facility-administered encounter medications on file as of 06/07/2024.     REVIEW OF SYSTEMS  : All other systems reviewed and negative except where noted in the History of Present Illness.   PHYSICAL EXAM: BP (!) 140/72   Pulse 82   Ht 5' 9 (1.753 m)   Wt 250 lb 6 oz (113.6 kg)   BMI 36.97 kg/m  General: Well developed AA male in no acute distress; on oxygen Assessment & Plan Personal history of colon polyps with last colonoscopy in 2021 with a repeat recommended in 3 years. Colonoscopy scheduling is deferred pending cardiac catheterization, as post-stent antiplatelet therapy may delay the  procedure for at least six months. If no stents are placed, cardiology clearance will be required prior to scheduling. - Deferred colonoscopy scheduling until cardiac catheterization results are available. - Planned review of cardiac catheterization outcome next Friday. - If stents are placed, anticipate delay in colonoscopy for at least six months or until clearance is obtained to hold antiplatelet therapy. - If no stents are placed, will obtain cardiology clearance prior to scheduling colonoscopy at [facility] due to O2 use.    CC:  Arloa Jarvis, NP       [1]  Allergies Allergen Reactions   Lisinopril Shortness Of Breath   Molds & Smuts Anaphylaxis   Dust Mite Extract    "

## 2024-06-07 NOTE — Patient Instructions (Signed)
 Will decide on colonoscopy timing after your cardiac cath next week.

## 2024-06-07 NOTE — Telephone Encounter (Signed)
 Copied from CRM #[id number]. Topic: Clinical - Medication Refill >> Jun 07, 2024 11:18 AM Ismael A wrote: Medication: predniSONE  (DELTASONE ) 10 MG tablet  Has the patient contacted their pharmacy? Yes (Agent: If no, request that the patient contact the pharmacy for the refill. If patient does not wish to contact the pharmacy document the reason why and proceed with request.) (Agent: If yes, when and what did the pharmacy advise?)  This is the patient's preferred pharmacy:  [id number] [place], [place] - [address] [place] [address] Phone: [phone] Fax: [phone]   Is this the correct pharmacy for this prescription? Yes If no, delete pharmacy and type the correct one.   Has the prescription been filled recently? No - pt has been out for a few days now  Is the patient out of the medication? Yes  Has the patient been seen for an appointment in the last year OR does the patient have an upcoming appointment? Yes  Can we respond through MyChart? Yes, prefers call  Agent: Please be advised that Rx refills may take up to 3 business days. We ask that you follow-up with your pharmacy.    Pt requesting pred. Please advise

## 2024-06-08 ENCOUNTER — Other Ambulatory Visit: Payer: Self-pay | Admitting: Internal Medicine

## 2024-06-10 ENCOUNTER — Ambulatory Visit: Payer: Self-pay | Admitting: Pulmonary Disease

## 2024-06-10 NOTE — Telephone Encounter (Signed)
 [facility] Pulmonary Triage - Initial Assessment Questions Chief Complaint (e.g., cough, sob, wheezing, fever, chills, sweat or additional symptoms) *Go to specific symptom protocol after initial questions. SOB, prednisone  refill request  How long have symptoms been present? Few days  Have you tested for COVID or Flu? Note: If not, ask patient if a home test can be taken. If so, instruct patient to call back for positive results. No  MEDICINES:   Have you used any OTC meds to help with symptoms? No If yes, ask What medications?   Have you used your inhalers/maintenance medication? Yes If yes, What medications? albuterol   If inhaler, ask How many puffs and how often? Note: Review instructions on medication in the chart. Neb 1-2  OXYGEN: Do you wear supplemental oxygen? Yes If yes, How many liters are you supposed to use? 3l  Do you monitor your oxygen levels? Yes If yes, What is your reading (oxygen level) today? 94  What is your usual oxygen saturation reading?  (Note: Pulmonary O2 sats should be 90% or greater) 96     Reason for Triage: pt calling for prednisone  refill due to shortness of breath and it was sent on 01/23 but not sent to pharmacy, but advised him that it could take 3 business days to filled   Reason for Disposition  [1] MILD difficulty breathing (e.g., minimal/no SOB at rest, SOB with walking) AND [2] worse than normal  Answer Assessment - Initial Assessment Questions Declined sooner appt  1. MAIN CONCERN OR SYMPTOM: What's your main concern? (e.g., low oxygen level, breathing difficulty) What question do you have?     SOB 2. ONSET: When did the  SOB  start?      Several days ago 3. OXYGEN THERAPY:      3L 4.  OXYGEN EQUIPMENT:  Are you having any trouble with your oxygen equipment?  (e.g., cannula, mask, tubing, tank, concentrator)     no 5. OXYGEN SATURATION MONITOR:       94-96 6. OXYGEN LEVEL: What is your reading  (oxygen level) today? What is your usual oxygen saturation reading? (e.g., 95%)     94 7. VSS MONITORING: Do you monitor/measure your oxygen level or vital signs? (e.g., yes, no, measurements are automatically sent to provider/call center). Document CURRENT and NORMAL BASELINE values if available.        8. BREATHING DIFFICULTY: Are you having any difficulty breathing? If Yes, ask: How bad is it?  (e.g., none, mild, moderate, severe)      Sob with activity 9. OTHER SYMPTOMS: Do you have any other symptoms? (e.g., fever, change in sputum)     Yellow phlem 10. SMOKING: Do you smoke currently? Is there anyone that smokes around you?  (Note: smoking around oxygen is dangerous!)  Protocols used: Oxygen Monitoring and Hypoxia-A-AH

## 2024-06-11 MED ORDER — PREDNISONE 10 MG PO TABS: 30.0000 mg | ORAL_TABLET | Freq: Every day | ORAL | 0 refills | Status: AC | PRN

## 2024-06-11 MED ORDER — PREDNISONE 10 MG PO TABS: 30.0000 mg | ORAL_TABLET | Freq: Every day | ORAL | 3 refills | Status: AC | PRN

## 2024-06-11 NOTE — Telephone Encounter (Signed)
 I have refilled the pred   Pt aware to keep appt with Dr. Annella 06/20/24   Nothing further needed

## 2024-06-13 ENCOUNTER — Telehealth: Payer: Self-pay | Admitting: *Deleted

## 2024-06-13 NOTE — Telephone Encounter (Addendum)
 Cardiac Catheterization scheduled at [facility] for: Friday June 14, 2024 11:30 AM Arrival time [facility] Main Entrance A at: 9:30 AM  Diet: -Nothing to eat after midnight.  Hydration: -May drink clear liquids until 2 hours before the procedure.  Approved liquids: Water, clear tea, black coffee, fruit juices-non-citric and without pulp,Gatorade, plain Jello/popsicles.   -Please drink 8 oz of water 2 hours before procedure.  Medication instructions: -Hold:  Jardiance -AM of procedure  Metformin -day of procedure and 48 hours after procedure  Torsemide /KCl/Valsartan -day before and day of procedure-per protocol GFR < 60 (51) -Usual morning medications can be taken including aspirin  81 mg.  Ozempic -weekly   Plan to go home the same day, you will only stay overnight if medically necessary.  You must have responsible adult to drive you home.  Someone must be with you the first 24 hours after you arrive home.  Reviewed procedure instructions with patient.

## 2024-06-14 ENCOUNTER — Ambulatory Visit (HOSPITAL_COMMUNITY)
Admission: RE | Admit: 2024-06-14 | Discharge: 2024-06-14 | Disposition: A | Discharge status: Home / Self Care | Attending: Internal Medicine | Admitting: Internal Medicine

## 2024-06-14 ENCOUNTER — Other Ambulatory Visit: Payer: Self-pay | Admitting: Cardiology

## 2024-06-14 ENCOUNTER — Other Ambulatory Visit: Payer: Self-pay

## 2024-06-14 ENCOUNTER — Encounter (HOSPITAL_COMMUNITY)
Admission: RE | Disposition: A | Discharge status: Home / Self Care | Payer: Self-pay | Source: Home / Self Care | Attending: Internal Medicine

## 2024-06-14 DIAGNOSIS — R0602 Shortness of breath: Secondary | ICD-10-CM

## 2024-06-14 DIAGNOSIS — R000 Tachycardia, unspecified: Secondary | ICD-10-CM | POA: Diagnosis not present

## 2024-06-14 DIAGNOSIS — J449 Chronic obstructive pulmonary disease, unspecified: Secondary | ICD-10-CM | POA: Insufficient documentation

## 2024-06-14 DIAGNOSIS — I5032 Chronic diastolic (congestive) heart failure: Secondary | ICD-10-CM | POA: Diagnosis not present

## 2024-06-14 DIAGNOSIS — F17200 Nicotine dependence, unspecified, uncomplicated: Secondary | ICD-10-CM | POA: Diagnosis not present

## 2024-06-14 DIAGNOSIS — N189 Chronic kidney disease, unspecified: Secondary | ICD-10-CM | POA: Insufficient documentation

## 2024-06-14 DIAGNOSIS — I2510 Atherosclerotic heart disease of native coronary artery without angina pectoris: Secondary | ICD-10-CM | POA: Diagnosis not present

## 2024-06-14 DIAGNOSIS — Z6835 Body mass index (BMI) 35.0-35.9, adult: Secondary | ICD-10-CM | POA: Insufficient documentation

## 2024-06-14 DIAGNOSIS — I130 Hypertensive heart and chronic kidney disease with heart failure and stage 1 through stage 4 chronic kidney disease, or unspecified chronic kidney disease: Secondary | ICD-10-CM | POA: Diagnosis not present

## 2024-06-14 DIAGNOSIS — Z79899 Other long term (current) drug therapy: Secondary | ICD-10-CM

## 2024-06-14 DIAGNOSIS — Z9981 Dependence on supplemental oxygen: Secondary | ICD-10-CM | POA: Diagnosis not present

## 2024-06-14 DIAGNOSIS — I5030 Unspecified diastolic (congestive) heart failure: Secondary | ICD-10-CM | POA: Insufficient documentation

## 2024-06-14 DIAGNOSIS — I421 Obstructive hypertrophic cardiomyopathy: Secondary | ICD-10-CM | POA: Insufficient documentation

## 2024-06-14 DIAGNOSIS — R0600 Dyspnea, unspecified: Secondary | ICD-10-CM | POA: Insufficient documentation

## 2024-06-14 LAB — POCT I-STAT EG7
Acid-Base Excess: 2 mmol/L (ref 0.0–2.0)
Acid-Base Excess: 4 mmol/L — ABNORMAL HIGH (ref 0.0–2.0)
Bicarbonate: 27.8 mmol/L (ref 20.0–28.0)
Bicarbonate: 29.9 mmol/L — ABNORMAL HIGH (ref 20.0–28.0)
Calcium, Ion: 1.25 mmol/L (ref 1.15–1.40)
Calcium, Ion: 1.34 mmol/L (ref 1.15–1.40)
HCT: 34 % — ABNORMAL LOW (ref 39.0–52.0)
HCT: 34 % — ABNORMAL LOW (ref 39.0–52.0)
Hemoglobin: 11.6 g/dL — ABNORMAL LOW (ref 13.0–17.0)
Hemoglobin: 11.6 g/dL — ABNORMAL LOW (ref 13.0–17.0)
O2 Saturation: 70 %
O2 Saturation: 74 %
Potassium: 3.9 mmol/L (ref 3.5–5.1)
Potassium: 4.1 mmol/L (ref 3.5–5.1)
Sodium: 139 mmol/L (ref 135–145)
Sodium: 139 mmol/L (ref 135–145)
TCO2: 29 mmol/L (ref 22–32)
TCO2: 31 mmol/L (ref 22–32)
pCO2, Ven: 49.5 mmHg (ref 44–60)
pCO2, Ven: 51.8 mmHg (ref 44–60)
pH, Ven: 7.357 (ref 7.25–7.43)
pH, Ven: 7.37 (ref 7.25–7.43)
pO2, Ven: 39 mmHg (ref 32–45)
pO2, Ven: 41 mmHg (ref 32–45)

## 2024-06-14 LAB — POCT I-STAT 7, (LYTES, BLD GAS, ICA,H+H)
Acid-Base Excess: 2 mmol/L (ref 0.0–2.0)
Bicarbonate: 26.5 mmol/L (ref 20.0–28.0)
Calcium, Ion: 1.24 mmol/L (ref 1.15–1.40)
HCT: 35 % — ABNORMAL LOW (ref 39.0–52.0)
Hemoglobin: 11.9 g/dL — ABNORMAL LOW (ref 13.0–17.0)
O2 Saturation: 97 %
Potassium: 4.1 mmol/L (ref 3.5–5.1)
Sodium: 138 mmol/L (ref 135–145)
TCO2: 28 mmol/L (ref 22–32)
pCO2 arterial: 41.5 mmHg (ref 32–48)
pH, Arterial: 7.412 (ref 7.35–7.45)
pO2, Arterial: 87 mmHg (ref 83–108)

## 2024-06-14 LAB — GLUCOSE, CAPILLARY: Glucose-Capillary: 174 mg/dL — ABNORMAL HIGH (ref 70–99)

## 2024-06-14 MED ORDER — LABETALOL HCL 5 MG/ML IV SOLN: 10.0000 mg | INTRAVENOUS | Status: DC | PRN

## 2024-06-14 MED ORDER — FENTANYL CITRATE (PF) 100 MCG/2ML IJ SOLN
INTRAMUSCULAR | Status: DC | PRN
  Administered 2024-06-14: 25 ug via INTRAVENOUS

## 2024-06-14 MED ORDER — TORSEMIDE 40 MG PO TABS: 80.0000 mg | ORAL_TABLET | Freq: Every day | ORAL | 1 refills | Status: AC

## 2024-06-14 MED ORDER — SODIUM CHLORIDE 0.9 % IV SOLN: 250.0000 mL | INTRAVENOUS | Status: DC | PRN

## 2024-06-14 MED ORDER — SODIUM CHLORIDE 0.9% FLUSH: 3.0000 mL | Freq: Two times a day (BID) | INTRAVENOUS | Status: DC

## 2024-06-14 MED ORDER — VERAPAMIL HCL 2.5 MG/ML IV SOLN
INTRAVENOUS | Status: DC | PRN
  Administered 2024-06-14: 10 mL via INTRA_ARTERIAL

## 2024-06-14 MED ORDER — HEPARIN SODIUM (PORCINE) 1000 UNIT/ML IJ SOLN
INTRAMUSCULAR | Status: AC
  Filled 2024-06-14: qty 10

## 2024-06-14 MED ORDER — IOHEXOL 350 MG/ML SOLN
INTRAVENOUS | Status: DC | PRN
  Administered 2024-06-14: 35 mL

## 2024-06-14 MED ORDER — HYDRALAZINE HCL 20 MG/ML IJ SOLN: 10.0000 mg | INTRAMUSCULAR | Status: DC | PRN

## 2024-06-14 MED ORDER — HEPARIN SODIUM (PORCINE) 1000 UNIT/ML IJ SOLN
INTRAMUSCULAR | Status: DC | PRN
  Administered 2024-06-14: 5000 [IU] via INTRAVENOUS

## 2024-06-14 MED ORDER — ACETAMINOPHEN 325 MG PO TABS: 650.0000 mg | ORAL_TABLET | ORAL | Status: DC | PRN

## 2024-06-14 MED ORDER — MIDAZOLAM HCL (PF) 2 MG/2ML IJ SOLN
INTRAMUSCULAR | Status: DC | PRN
  Administered 2024-06-14: 1 mg via INTRAVENOUS

## 2024-06-14 MED ORDER — SODIUM CHLORIDE 0.9 % IV SOLN
INTRAVENOUS | Status: DC | PRN
  Administered 2024-06-14: 10 mL/h via INTRAVENOUS

## 2024-06-14 MED ORDER — SODIUM CHLORIDE 0.9% FLUSH: 3.0000 mL | INTRAVENOUS | Status: DC | PRN

## 2024-06-14 MED ORDER — FREE WATER: 250.0000 mL | Freq: Once | Status: DC

## 2024-06-14 MED ORDER — FUROSEMIDE 10 MG/ML IJ SOLN: 60.0000 mg | Freq: Once | INTRAMUSCULAR | Status: DC

## 2024-06-14 MED ORDER — MIDAZOLAM HCL 2 MG/2ML IJ SOLN
INTRAMUSCULAR | Status: AC
  Filled 2024-06-14: qty 2

## 2024-06-14 MED ORDER — EPINEPHRINE HCL 5 MG/250ML IV SOLN IN NS
INTRAVENOUS | Status: AC
  Filled 2024-06-14: qty 250

## 2024-06-14 MED ORDER — VERAPAMIL HCL 2.5 MG/ML IV SOLN
INTRAVENOUS | Status: AC
  Filled 2024-06-14: qty 2

## 2024-06-14 MED ORDER — FENTANYL CITRATE (PF) 100 MCG/2ML IJ SOLN
INTRAMUSCULAR | Status: AC
  Filled 2024-06-14: qty 2

## 2024-06-14 MED ORDER — ASPIRIN 81 MG PO CHEW: 81.0000 mg | CHEWABLE_TABLET | ORAL | Status: DC

## 2024-06-14 MED ORDER — HEPARIN (PORCINE) IN NACL 1000-0.9 UT/500ML-% IV SOLN
INTRAVENOUS | Status: DC | PRN
  Administered 2024-06-14: 1000 mL

## 2024-06-14 MED ORDER — ONDANSETRON HCL 4 MG/2ML IJ SOLN: 4.0000 mg | Freq: Four times a day (QID) | INTRAMUSCULAR | Status: DC | PRN

## 2024-06-14 MED ORDER — LIDOCAINE HCL (PF) 1 % IJ SOLN
INTRAMUSCULAR | Status: DC | PRN
  Administered 2024-06-14: 5 mL via INTRADERMAL

## 2024-06-14 NOTE — Progress Notes (Signed)
 BMET ordered per Dr. Lynnette Caffey

## 2024-06-14 NOTE — Interval H&P Note (Signed)
 History and Physical Interval Note:  06/14/2024 9:08 AM  Joseph Harvey  has presented today for surgery, with the diagnosis of cardiomyopathy - heart failure.  The various methods of treatment have been discussed with the patient and family. After consideration of risks, benefits and other options for treatment, the patient has consented to  Procedures: RIGHT/LEFT HEART CATH AND CORONARY ANGIOGRAPHY (N/A) as a surgical intervention.  The patient's history has been reviewed, patient examined, no change in status, stable for surgery.  I have reviewed the patient's chart and labs.  Questions were answered to the patient's satisfaction.     [name] K Purnell Daigle

## 2024-06-14 NOTE — Discharge Instructions (Addendum)
 Restart metformin  on 06/16/24     Drink plenty of fluids for 48 hours and keep wrist elevated at heart level for 24 hours  Radial Site Care   This sheet gives you information about how to care for yourself after your procedure. Your health care provider may also give you more specific instructions. If you have problems or questions, contact your health care provider. What can I expect after the procedure? After the procedure, it is common to have: Bruising and tenderness at the catheter insertion area. Follow these instructions at home: Medicines Take over-the-counter and prescription medicines only as told by your health care provider. Insertion site care Follow instructions from your health care provider about how to take care of your insertion site. Make sure you: Wash your hands with soap and water  before you change your bandage (dressing). If soap and water  are not available, use hand sanitizer. Remove your dressing as told by your health care provider. In 24 hours Check your insertion site every day for signs of infection. Check for: Redness, swelling, or pain. Fluid or blood. Pus or a bad smell. Warmth. Do not take baths, swim, or use a hot tub until your health care provider approves. You may shower 24-48 hours after the procedure, or as directed by your health care provider. Remove the dressing and gently wash the site with plain soap and water . Pat the area dry with a clean towel. Do not rub the site. That could cause bleeding. Do not apply powder or lotion to the site. Activity   For 24 hours after the procedure, or as directed by your health care provider: Do not flex or bend the affected arm. Do not push or pull heavy objects with the affected arm. Do not drive yourself home from the hospital or clinic. You may drive 24 hours after the procedure unless your health care provider tells you not to. Do not operate machinery or power tools. Do not lift anything that is  heavier than 10 lb (4.5 kg), or the limit that you are told, until your health care provider says that it is safe.  For 4 days Ask your health care provider when it is okay to: Return to work or school. Resume usual physical activities or sports. Resume sexual activity. General instructions If the catheter site starts to bleed, raise your arm and put firm pressure on the site. If the bleeding does not stop, get help right away. This is a medical emergency. If you went home on the same day as your procedure, a responsible adult should be with you for the first 24 hours after you arrive home. Keep all follow-up visits as told by your health care provider. This is important. Contact a health care provider if: You have a fever. You have redness, swelling, or yellow drainage around your insertion site. Get help right away if: You have unusual pain at the radial site. The catheter insertion area swells very fast. The insertion area is bleeding, and the bleeding does not stop when you hold steady pressure on the area. Your arm or hand becomes pale, cool, tingly, or numb. These symptoms may represent a serious problem that is an emergency. Do not wait to see if the symptoms will go away. Get medical help right away. Call your local emergency services (911 in the [place] Do not drive yourself to the hospital. Summary After the procedure, it is common to have bruising and tenderness at the site. Follow instructions from your health care  provider about how to take care of your radial site wound. Check the wound every day for signs of infection. Do not lift anything that is heavier than 10 lb (4.5 kg), or the limit that you are told, until your health care provider says that it is safe. This information is not intended to replace advice given to you by your health care provider. Make sure you discuss any questions you have with your health care provider. Document Revised: 06/07/2017 Document Reviewed:  06/07/2017 Elsevier Patient Education  2020 [employer]Brachial Site Care   This sheet gives you information about how to care for yourself after your procedure. Your health care provider may also give you more specific instructions. If you have problems or questions, contact your health care provider. What can I expect after the procedure? After the procedure, it is common to have: Bruising and tenderness at the catheter insertion area. Follow these instructions at home:  Insertion site care Follow instructions from your health care provider about how to take care of your insertion site. Make sure you: Wash your hands with soap and water  before you change your bandage (dressing). If soap and water  are not available, use hand sanitizer. Remove your dressing as told by your health care provider. In 24 hours Check your insertion site every day for signs of infection. Check for: Redness, swelling, or pain. Pus or a bad smell. Warmth. You may shower 24-48 hours after the procedure. Do not apply powder or lotion to the site.  Activity For 24 hours after the procedure, or as directed by your health care provider: Do not push or pull heavy objects with the affected arm. Do not drive yourself home from the hospital or clinic. You may drive 24 hours after the procedure unless your health care provider tells you not to. Do not lift anything that is heavier than 10 lb (4.5 kg), or the limit that you are told, until your health care provider says that it is safe.  For 24 hours

## 2024-06-14 NOTE — Interval H&P Note (Signed)
 History and Physical Interval Note:  06/14/2024 8:47 AM  Joseph Harvey  has presented today for surgery, with the diagnosis of cardiomyopathy - heart failure.  The various methods of treatment have been discussed with the patient and family. After consideration of risks, benefits and other options for treatment, the patient has consented to  Procedures: RIGHT/LEFT HEART CATH AND CORONARY ANGIOGRAPHY (N/A) as a surgical intervention.  The patient's history has been reviewed, patient examined, no change in status, stable for surgery.  I have reviewed the patient's chart and labs.  Questions were answered to the patient's satisfaction.     [name]

## 2024-06-15 ENCOUNTER — Encounter (HOSPITAL_COMMUNITY): Payer: Self-pay | Admitting: Internal Medicine

## 2024-06-18 ENCOUNTER — Ambulatory Visit (HOSPITAL_COMMUNITY)
Admission: RE | Admit: 2024-06-18 | Discharge: 2024-06-18 | Disposition: A | Source: Ambulatory Visit | Attending: Internal Medicine | Admitting: Internal Medicine

## 2024-06-18 DIAGNOSIS — I421 Obstructive hypertrophic cardiomyopathy: Secondary | ICD-10-CM | POA: Diagnosis not present

## 2024-06-18 LAB — ECHOCARDIOGRAM COMPLETE
Area-P 1/2: 14.31 cm2
S' Lateral: 2.4 cm

## 2024-06-19 ENCOUNTER — Ambulatory Visit: Payer: Self-pay | Admitting: Internal Medicine

## 2024-06-19 NOTE — Telephone Encounter (Signed)
 Pt reports weight increase 1/20 OV 240 lbs on 1/23 250 lbs.  Home scale is broken so unable to weigh daily.  Is planning to get a new scale soon.  Reports has blisters to left lower leg X2 1 has burst other remains.  Feels as if a blister is forming on right LE.  Says feels as if has weights on legs.    Pt recently had a heart cath and torsemide  was increased to 80 mg daily.  Sending to MD to advise.

## 2024-06-20 ENCOUNTER — Encounter: Payer: Self-pay | Admitting: Pulmonary Disease

## 2024-06-20 ENCOUNTER — Ambulatory Visit: Admitting: Pulmonary Disease

## 2024-06-20 VITALS — BP 116/68 | HR 114 | Temp 98.2°F | Ht 69.0 in | Wt 248.0 lb

## 2024-06-20 DIAGNOSIS — F17210 Nicotine dependence, cigarettes, uncomplicated: Secondary | ICD-10-CM

## 2024-06-20 DIAGNOSIS — J4550 Severe persistent asthma, uncomplicated: Secondary | ICD-10-CM

## 2024-06-20 DIAGNOSIS — J45998 Other asthma: Secondary | ICD-10-CM

## 2024-06-20 DIAGNOSIS — R0609 Other forms of dyspnea: Secondary | ICD-10-CM

## 2024-06-20 DIAGNOSIS — J4540 Moderate persistent asthma, uncomplicated: Secondary | ICD-10-CM

## 2024-06-20 DIAGNOSIS — I2722 Pulmonary hypertension due to left heart disease: Secondary | ICD-10-CM

## 2024-06-20 MED ORDER — METHYLPREDNISOLONE ACETATE 80 MG/ML IJ SUSP: 80.0000 mg | Freq: Once | INTRAMUSCULAR | Status: AC

## 2024-06-20 MED ORDER — CEPHALEXIN 500 MG PO CAPS: 500.0000 mg | ORAL_CAPSULE | Freq: Four times a day (QID) | ORAL | 0 refills | Status: AC

## 2024-06-20 NOTE — Progress Notes (Signed)
 "  Patient ID: Joseph Harvey, male    DOB: 03-Mar-1956, 69 y.o.   MRN: [id number]  Chief Complaint  Patient presents with   Asthma    Pt stated since LOV breathing has been off and on SOB occurs w/ any activity even when pt not doing anything  Pt stated he has dry mouth but can't cough anything up  Pt is currently on 3l of 02 on poc    Referring provider: Arloa Jarvis, NP  HPI:   Joseph Harvey is a 69 y.o. man whom we are seeing in follow-up for dyspnea exertion, asthma.  Most recent cardiology note reviewed x 2.    Returns for routine follow-up.  Unfortunate, a lot of issues with fluid buildup.  I think we discussed largely that really this is a major problem.  In addition to his steroid-dependent asthma.  Recent right heart catheterization with RA pressure of 19, mean PA pressure of 34, wedge of 22 with a PVR of 1.75.  Clearly postcapillary pulmonary hypertension.  Discussed need for good adherence to diuretics.  Systolic he is.  He still quite volume overloaded on exam.  HPI at initial visit: Patient formerly seen by Dr. Darlean last in 2016.  At last visit symptoms are well controlled with Symbicort .  Unclear exactly what occurred in the last 5 years.  But over the last several months he endorses worsening dyspnea on exertion.  Just walking around going to the gym he has become short of breath.  Endorses severe shortness of breath.  This is resolved within a minute or 2 of albuterol  administration.  In general his cough is much better than prior.  Suspect he is adhered well to Dr. Chari instructions regarding his airway cough syndrome.  He does feel like he has significant nasal congestion and postnasal drip.  This produces mucus in the back of her throat needs to clear her cough up but feels much different than his prior cough.  Scheduled to have sinus surgery but this was discontinued due to wheezing per his report.  His insurance is changed and now needs to find a new ENT doctor.  When he gets  bad bouts of that mucus buildup he will take a dose of prednisone  and within a minute, instantly the mucus feels better.  In terms of his dyspnea, rest improves his dyspnea.  There is no other aggravating or alleviating factors.  He has been on Breo for the last several months and says he thinks it helps somewhat with his breathing but is not at the level it was when he was followed by pulmonary prior.  Prior PFTs reviewed and interpreted as suggestive of mild restriction on spirometry, no fixed obstruction, lung volumes revealed TLC of 86% predicted, within normal limits or mildly reduced.  DLCO within normal limits.  Most recent chest x-ray 06/2018 reviewed instructed is clear lungs, current hyperinflation on PA film does not appear hyperinflated on lateral film.  PMH: Anxiety, obesity, diabetes, tobacco abuse, prostate cancer Surgical history: Lumbar back surgery Family History: Father with colon and lung cancer, mother with lung cancer Social history: Grew up in [place], lived there for 35 years, current smoker, 20+ pack year history   Questionaires / Pulmonary Flowsheets:   ACT:  Asthma Control Test ACT Total Score  01/04/2024  9:22 AM 13  04/18/2022  8:32 AM 17  03/10/2021  9:51 AM 13    MMRC:     No data to display  Epworth:      No data to display          Tests:   FENO:  No results found for: NITRICOXIDE  PFT:    Latest Ref Rng & Units 05/26/2021    8:14 AM 02/28/2020   10:53 AM  PFT Results  FVC-Pre L 2.31  2.06   FVC-Predicted Pre % 59  52   FVC-Post L 2.28  2.30   FVC-Predicted Post % 59  59   Pre FEV1/FVC % % 73  70   Post FEV1/FCV % % 72  74   FEV1-Pre L 1.69  1.43   FEV1-Predicted Pre % 56  47   FEV1-Post L 1.65  1.70   DLCO uncorrected ml/min/mmHg 19.07  21.37   DLCO UNC% % 71  80   DLCO corrected ml/min/mmHg 19.07  22.15   DLCO COR %Predicted % 71  82   DLVA Predicted % 99  123   Personally reviewed and interpreted as mixed  restrictive and obstructive physiology with gas trapping, normal DLCO, repeat in 05/2021 showed no longer bronchodilator response likely consistent with better controlled asthma, spirometry overall stable  WALK:     02/07/2024   10:35 AM 02/07/2024   10:08 AM 01/04/2024   10:27 AM 06/27/2023    4:01 PM  SIX MIN WALK  Medications  Asprin, Breztri , Calcitonin, EmpagliflozinMagnesium oxide, Mavacamten , Metformin , Metoprolol  Succinate, Potassium ChlorideValsartan, Vitamin D    Supplimental Oxygen during Test? (L/min) Yes No No No  O2 Flow Rate 3 L/min     Type Pulse   Continuous  Laps  19    Partial Lap (in Meters)  0 meters    Baseline BP (sitting)  130/86    Baseline Heartrate  131    Baseline Dyspnea (Borg Scale)  2    Baseline Fatigue (Borg Scale)  5    Baseline SPO2  94 %    BP (sitting)  132/80    Heartrate  134    Dyspnea (Borg Scale)  2    Fatigue (Borg Scale)  5    SPO2  95 %    BP (sitting)  140/84    Heartrate  116    SPO2  97 %    Stopped or Paused before Six Minutes  No    Interpretation  Hip pain;Calf pain    Distance Completed  646 meters    Distance Completed  0 meters    Tech Comments: Requires 3L pulsed and 3L continuous to keep sats >88-90%. Patient walked at a slow pace, with finger pulse ox attached during entire walk.  He did not have to stop for any fatigue, sob or pain.  patient continued to walk to see if sats would drop with additional walking, sats remained 94-95% on RA.  He has steps to his home so we went up 1 flight of stairs 3 times, sats dropped to 88% while walking another lap around the office.  Placed on 3L of pulsed oxygen, sats up to 97%.  HR was 143.  See pulmonary tab.  Qualified for oxygen. Pt walked at a medium pace, had no complaints of SOB. Patient walked half a lap and O2 dropped to 83 % was placed on 3L O2 went back up to 97%    Imaging: Reviewed as per EMR  Lab Results: Personally reviewed, no significant elevation of eosinophils, IgE and  RAST panel negative in past CBC    Component Value Date/Time   WBC 8.4 06/04/2024  1541   WBC 5.4 09/11/2023 0330   RBC 4.57 06/04/2024 1541   RBC 3.57 (L) 09/11/2023 0330   HGB 11.6 (L) 06/14/2024 1147   HGB 14.1 06/04/2024 1541   HCT 34.0 (L) 06/14/2024 1147   HCT 43.4 06/04/2024 1541   PLT 379 06/04/2024 1541   MCV 95 06/04/2024 1541   MCH 30.9 06/04/2024 1541   MCH 31.7 09/11/2023 0330   MCHC 32.5 06/04/2024 1541   MCHC 32.5 09/11/2023 0330   RDW 12.7 06/04/2024 1541   LYMPHSABS 1.5 09/06/2023 2307   MONOABS 0.6 09/06/2023 2307   EOSABS 0.0 09/06/2023 2307   BASOSABS 0.0 09/06/2023 2307    BMET    Component Value Date/Time   NA 139 06/14/2024 1147   NA 143 06/04/2024 1541   K 3.9 06/14/2024 1147   CL 93 (L) 06/04/2024 1541   CO2 32 (H) 06/04/2024 1541   GLUCOSE 140 (H) 06/04/2024 1541   GLUCOSE 175 (H) 09/20/2023 0947   BUN 23 06/04/2024 1541   CREATININE 1.49 (H) 06/04/2024 1541   CALCIUM  9.7 06/04/2024 1541   GFRNONAA 42 (L) 09/20/2023 0947   GFRAA 68 04/14/2020 1036    BNP    Component Value Date/Time   BNP 45.2 09/20/2023 0947    ProBNP    Component Value Date/Time   PROBNP 524.0 (H) 09/06/2023 2307   PROBNP 336 (H) 09/16/2021 1422       Allergies  Allergen Reactions   Lisinopril Shortness Of Breath   Molds & Smuts Anaphylaxis   Dust Mite Extract     Immunization History  Administered Date(s) Administered   DTaP 03/16/2012   Fluad Quad(high Dose 65+) 01/29/2024   Fluzone Influenza virus vaccine,trivalent (IIV3), split virus 04/15/2012   INFLUENZA, HIGH DOSE SEASONAL PF 01/28/2019, 12/23/2021   Influenza Split 02/07/2013, 02/14/2015, 01/21/2016   Influenza Whole 04/15/2012, 02/10/2020   Influenza, Seasonal, Injecte, Preservative Fre 02/07/2013, 02/14/2015, 01/21/2016, 01/27/2017   Influenza,inj,Quad PF,6+ Mos 01/28/2019   Influenza,inj,Quad PF,6-35 Mos 02/08/2021   Influenza,trivalent, recombinat, inj, PF 02/07/2013, 02/14/2015    Influenza-Unspecified 02/07/2013, 02/07/2013, 02/14/2015, 02/14/2015, 01/21/2016, 01/21/2016, 12/22/2016, 12/22/2016, 01/27/2017, 01/27/2017   PFIZER(Purple Top)SARS-COV-2 Vaccination 07/29/2019, 08/19/2019, 01/07/2020, 03/17/2021   PNEUMOCOCCAL CONJUGATE-20 12/08/2021   Pneumococcal Conjugate-13 03/16/2015   Pneumococcal Polysaccharide-23 03/16/2012, 05/16/2013   Tdap 12/19/2016   Zoster Recombinant(Shingrix) 12/19/2016, 02/27/2017    Past Medical History:  Diagnosis Date   Allergy     Dust, mold, dust mites   Anemia    Anxiety    Asthma    Cancer (HCC)    prostate   Cataract    bilateral repair.   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   Pneumonia    Stress incontinence     Tobacco History: Social History   Tobacco Use  Smoking Status Every Day   Current packs/day: 0.75   Average packs/day: 0.8 packs/day for 50.1 years (37.6 ttl pk-yrs)   Types: Cigarettes   Start date: 1976  Smokeless Tobacco Never  Tobacco Comments   still smoking 0.5 ppd   Currently still smoking 0.5 ppd of cigarettes a day 06/20/2024   Ready to quit: Not Answered Counseling given: Not Answered Tobacco comments: still smoking 0.5 ppd Currently still smoking 0.5 ppd of cigarettes a  day 06/20/2024      Outpatient Encounter Medications as of 06/20/2024  Medication Sig   acetaminophen  (TYLENOL ) 500 MG tablet Take 500-1,000 mg by mouth every 6 (six) hours as needed for mild pain (pain score 1-3) or moderate pain (pain score 4-6).   albuterol  (PROVENTIL ) (2.5 MG/3ML) 0.083% nebulizer solution TAKE 3 MLS BY NEBULIZATION EVERY 4 HOURS AS NEEDED FOR WHEEZING OR SHORTNESS OF BREATH (((PLAN B))) (Patient taking differently: Take 3 mLs by nebulization every 4 (four) hours as needed for wheezing or  shortness of breath.)   albuterol  (VENTOLIN  HFA) 108 (90 Base) MCG/ACT inhaler Inhale 2 puffs into the lungs every 4 hours as needed for shortness of breath or wheezing   ASPIRIN  81 PO Take 81 mg by mouth daily.   Budeson-Glycopyrrol-Formoterol  (BREZTRI  AEROSPHERE) 160-9-4.8 MCG/ACT AERO Inhale 2 puffs into the lungs in the morning and at bedtime.   busPIRone  (BUSPAR ) 15 MG tablet Take 15 mg by mouth 3 (three) times daily as needed (anxiety).   cephALEXin  (KEFLEX ) 500 MG capsule Take 1 capsule (500 mg total) by mouth 4 (four) times daily for 5 days.   cetirizine (ZYRTEC) 10 MG tablet Take 10 mg by mouth every morning.   Cholecalciferol  (VITAMIN D PO) Take 1 tablet by mouth daily.   clotrimazole-betamethasone (LOTRISONE) cream Apply 1 Application topically daily as needed (irritation).   cyclobenzaprine  (FLEXERIL ) 10 MG tablet Take 1 tablet (10 mg total) by mouth 3 (three) times daily as needed for muscle spasms. (Patient taking differently: Take 10 mg by mouth 3 (three) times daily as needed for muscle spasms. Pt stated he takes daily)   dextromethorphan -guaiFENesin  (MUCINEX  DM) 30-600 MG 12hr tablet Take 1 tablet by mouth 2 (two) times daily. (Patient taking differently: Take 1 tablet by mouth 2 (two) times daily as needed for cough. As needed)   empagliflozin  (JARDIANCE ) 10 MG TABS tablet Take 1 tablet (10 mg total) by mouth daily.   ezetimibe  (ZETIA ) 10 MG tablet Take 1 tablet (10 mg total) by mouth every evening.   gabapentin  (NEURONTIN ) 300 MG capsule Take 1 capsule (300 mg total) by mouth daily as needed (Nerve pain). (Patient taking differently: Take 300 mg by mouth 3 (three) times daily as needed (Nerve pain).)   ipratropium (ATROVENT ) 0.06 % nasal spray Place 2 sprays into both nostrils 3 (three) times daily. As needed for nasal congestion, runny nose   ipratropium-albuterol  (DUONEB) 0.5-2.5 (3) MG/3ML SOLN INHALE 1 VIAL VIA NEBULIZER TWICE A DAY (Patient taking differently: Take 3 mLs by  nebulization 2 (two) times daily as needed (wheezing).)   LINZESS  145 MCG CAPS capsule Take 145 mcg by mouth daily.   MAGnesium -Oxide 400 (240 Mg) MG tablet Take 1 tablet (400 mg total) by mouth daily.   mavacamten  (CAMZYOS ) 10 MG CAPS capsule Take 1 capsule (10 mg total) by mouth daily.   metFORMIN  (GLUCOPHAGE -XR) 500 MG 24 hr tablet Take 2 tablets (1,000 mg total) by mouth in the morning AND 1 tablet (500 mg total) every evening with meals.   metoprolol  (TOPROL  XL) 200 MG 24 hr tablet Take 1 tablet (200 mg total) by mouth daily.   metoprolol  succinate (TOPROL -XL) 100 MG 24 hr tablet Take 100 mg by mouth daily as needed (a-fib). Take with or immediately following a meal.   montelukast  (SINGULAIR ) 10 MG tablet Take 1 tablet (10 mg total) by mouth at bedtime as needed (allergies).   nicotine  (NICODERM CQ  - DOSED IN MG/24 HOURS) 14 mg/24hr patch Place 14 mg onto  the skin daily.   nicotine  (NICODERM CQ  - DOSED IN MG/24 HR) 7 mg/24hr patch PLACE 1 PATCH (7 MG TOTAL) ONTO THE SKIN DAILY.   NON FORMULARY Pt uses a cpap nightly   OXYGEN Inhale 3 L into the lungs See admin instructions. Inhale 3L when needed unless and continuously while sleeping   potassium chloride  SA (KLOR-CON  M) 20 MEQ tablet Take 2 tablets (40 mEq total) by mouth daily.   predniSONE  (DELTASONE ) 10 MG tablet Take 3 tablets (30 mg total) by mouth daily as needed.   predniSONE  (DELTASONE ) 10 MG tablet Take 3 tablets (30 mg total) by mouth daily as needed.   rosuvastatin  (CRESTOR ) 20 MG tablet Take 1 tablet (20 mg total) by mouth daily.   Semaglutide ,0.25 or 0.5MG /DOS, 2 MG/3ML SOPN Inject 0.25 mg into the skin every Saturday.   silver  sulfADIAZINE  (SILVADENE ) 1 % cream Apply 1 Application topically as needed (rash).   torsemide  40 MG TABS Take 80 mg by mouth daily. May take an additional 20 mg once daily as needed for swelling   traMADol  (ULTRAM ) 50 MG tablet Take 1 tablet (50 mg total) by mouth every evening. Each prescription to last  1 mth (Patient taking differently: Take 50 mg by mouth 3 (three) times daily as needed for moderate pain (pain score 4-6).)   triamcinolone  (KENALOG ) 0.025 % cream Apply 1 Application topically daily as needed (itching).   valsartan  (DIOVAN ) 160 MG tablet Take 1 tablet (160 mg total) by mouth daily.   busPIRone  (BUSPAR ) 10 MG tablet Take 1 tablet (10 mg total) by mouth as needed (anxiety). (Patient not taking: Reported on 06/20/2024)   doxycycline  (VIBRAMYCIN ) 100 MG capsule Take 100 mg by mouth 2 (two) times daily. (Patient not taking: Reported on 06/20/2024)   glucose blood (TRUE METRIX BLOOD GLUCOSE TEST) test strip Use as instructed (Patient not taking: Reported on 06/20/2024)   TRUEplus Lancets 28G MISC Use as directed (Patient not taking: Reported on 06/20/2024)   Facility-Administered Encounter Medications as of 06/20/2024  Medication   methylPREDNISolone  acetate (DEPO-MEDROL ) injection 80 mg     Review of Systems  Review of Systems  N/AA Physical Exam  BP 116/68   Pulse (!) 114   Temp 98.2 F (36.8 C) (Oral)   Ht 5' 9 (1.753 m)   Wt 248 lb (112.5 kg)   SpO2 96% Comment: on 3l of poc  BMI 36.62 kg/m   Wt Readings from Last 5 Encounters:  06/20/24 248 lb (112.5 kg)  06/14/24 250 lb (113.4 kg)  06/07/24 250 lb 6 oz (113.6 kg)  06/04/24 240 lb (108.9 kg)  05/29/24 241 lb (109.3 kg)    BMI Readings from Last 5 Encounters:  06/20/24 36.62 kg/m  06/14/24 36.92 kg/m  06/07/24 36.97 kg/m  06/04/24 35.44 kg/m  05/29/24 35.59 kg/m     Physical Exam General: In chair, no distress Eyes: No icterus Respiratory: clear, no wheeze, NWOB, poor air excursion Cardiovascular:: Regular rate and rhythm Skin: Area of erythema that is tender to touch just above the ankle on the right   Assessment & Plan:   Dyspnea on exertion: Multifactoral related to asthma, historically difficult to control, hypertrophic cardiomyopathy, group 2 pulmonary hypertension,  deconditioning.  Steroid-dependent asthma/chronic bronchitis: Did not improve with Dupixent .  Continue Breztri  for aggressive treatment.  Depo-Medrol  shot today.  Prednisone  on hand to take as needed.  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  Not a candidate for  pulmonary vasodilators.  Continue aggressive diuresis.  Per cardiology.  He continues to appear volume overloaded.  Suspected early cellulitis: Small abrasion right lower shin with area of creeping erythema tender to touch, warm.  No range, no purulence.  Keflex  sent to local pharmacy.  Instructed to go to emergency room if worsens or not improved with Keflex  therapy.  Return in about 6 months (around 12/18/2024) for f/u Dr. Annella.   Donnice JONELLE Annella, MD 06/20/2024  I spent 41 minutes in the care of the patient today face-to-face visit, review of records, coordination of care. "

## 2024-06-20 NOTE — Patient Instructions (Signed)
 Nice to see you again  No change in medicine  I think the fluid buildup is the major problem  Steroid shot today  Take Keflex  (antibiotic) as prescribed 4 times a day for 5 days because I am concerned that redness on the right lower shin could be early infection  If this gets worse or does not improve please seek emergency care.  Return to clinic in 6 months or sooner due to concerns.

## 2024-07-03 ENCOUNTER — Ambulatory Visit: Admitting: Pulmonary Disease

## 2024-07-04 ENCOUNTER — Ambulatory Visit: Admitting: Internal Medicine

## 2024-07-30 ENCOUNTER — Ambulatory Visit: Admitting: Internal Medicine
